# Patient Record
Sex: Female | Born: 1965 | Race: Black or African American | Hispanic: No | Marital: Married | Smoking: Never smoker
Health system: Southern US, Community
[De-identification: ages and names within clinical notes are randomized; demographics above are authoritative.]

## PROBLEM LIST (undated history)

## (undated) DIAGNOSIS — B019 Varicella without complication: Secondary | ICD-10-CM

## (undated) DIAGNOSIS — R569 Unspecified convulsions: Secondary | ICD-10-CM

## (undated) DIAGNOSIS — I10 Essential (primary) hypertension: Secondary | ICD-10-CM

## (undated) DIAGNOSIS — I639 Cerebral infarction, unspecified: Secondary | ICD-10-CM

## (undated) DIAGNOSIS — R51 Headache: Secondary | ICD-10-CM

## (undated) DIAGNOSIS — J309 Allergic rhinitis, unspecified: Secondary | ICD-10-CM

## (undated) DIAGNOSIS — E119 Type 2 diabetes mellitus without complications: Secondary | ICD-10-CM

## (undated) DIAGNOSIS — R32 Unspecified urinary incontinence: Secondary | ICD-10-CM

## (undated) DIAGNOSIS — E785 Hyperlipidemia, unspecified: Secondary | ICD-10-CM

## (undated) DIAGNOSIS — G40909 Epilepsy, unspecified, not intractable, without status epilepticus: Secondary | ICD-10-CM

## (undated) DIAGNOSIS — R519 Headache, unspecified: Secondary | ICD-10-CM

## (undated) DIAGNOSIS — F32A Depression, unspecified: Secondary | ICD-10-CM

## (undated) DIAGNOSIS — F329 Major depressive disorder, single episode, unspecified: Secondary | ICD-10-CM

## (undated) HISTORY — DX: Allergic rhinitis, unspecified: J30.9

## (undated) HISTORY — DX: Unspecified urinary incontinence: R32

## (undated) HISTORY — DX: Headache: R51

## (undated) HISTORY — DX: Varicella without complication: B01.9

## (undated) HISTORY — DX: Headache, unspecified: R51.9

---

## 2007-10-28 ENCOUNTER — Other Ambulatory Visit: Payer: Self-pay

## 2007-10-28 ENCOUNTER — Emergency Department: Payer: Self-pay | Admitting: Emergency Medicine

## 2008-06-13 ENCOUNTER — Emergency Department: Payer: Self-pay | Admitting: Emergency Medicine

## 2009-02-07 ENCOUNTER — Emergency Department: Payer: Self-pay | Admitting: Emergency Medicine

## 2009-07-07 ENCOUNTER — Emergency Department: Payer: Self-pay | Admitting: Emergency Medicine

## 2009-12-18 ENCOUNTER — Emergency Department: Payer: Self-pay | Admitting: Emergency Medicine

## 2010-05-29 ENCOUNTER — Emergency Department: Payer: Self-pay | Admitting: Emergency Medicine

## 2010-07-26 ENCOUNTER — Emergency Department: Payer: Self-pay | Admitting: Emergency Medicine

## 2010-12-17 ENCOUNTER — Emergency Department: Payer: Self-pay | Admitting: Emergency Medicine

## 2011-05-30 ENCOUNTER — Emergency Department: Payer: Self-pay | Admitting: *Deleted

## 2011-08-03 ENCOUNTER — Emergency Department: Payer: Self-pay | Admitting: Unknown Physician Specialty

## 2011-08-05 ENCOUNTER — Emergency Department: Payer: Self-pay | Admitting: Internal Medicine

## 2011-08-27 ENCOUNTER — Emergency Department: Payer: Self-pay | Admitting: Unknown Physician Specialty

## 2012-01-11 ENCOUNTER — Emergency Department: Payer: Self-pay | Admitting: Emergency Medicine

## 2012-01-15 ENCOUNTER — Emergency Department: Payer: Self-pay | Admitting: Emergency Medicine

## 2012-03-15 ENCOUNTER — Emergency Department: Payer: Self-pay | Admitting: Emergency Medicine

## 2012-06-16 ENCOUNTER — Inpatient Hospital Stay: Payer: Self-pay | Admitting: Internal Medicine

## 2012-06-16 LAB — COMPREHENSIVE METABOLIC PANEL
Anion Gap: 12 (ref 7–16)
BUN: 10 mg/dL (ref 7–18)
Bilirubin,Total: 0.5 mg/dL (ref 0.2–1.0)
Chloride: 103 mmol/L (ref 98–107)
Co2: 24 mmol/L (ref 21–32)
Creatinine: 0.63 mg/dL (ref 0.60–1.30)
EGFR (African American): >60
EGFR (Non-African Amer.): >60
Glucose: 326 mg/dL — ABNORMAL HIGH (ref 65–99)
Osmolality: 289 (ref 275–301)
Potassium: 5.3 mmol/L — ABNORMAL HIGH (ref 3.5–5.1)
SGOT(AST): 25 U/L (ref 15–37)
Total Protein: 7.5 g/dL (ref 6.4–8.2)

## 2012-06-16 LAB — URINALYSIS, COMPLETE
Bacteria: NONE SEEN
Blood: NEGATIVE
Glucose,UR: >=500 mg/dL (ref 0–75)
Leukocyte Esterase: NEGATIVE
Nitrite: NEGATIVE
Ph: 7 (ref 4.5–8.0)
Protein: NEGATIVE
Specific Gravity: 1.018 (ref 1.003–1.030)
WBC UR: 3 /HPF (ref 0–5)

## 2012-06-16 LAB — CBC WITH DIFFERENTIAL/PLATELET
Basophil #: 0.1 10*3/uL (ref 0.0–0.1)
Basophil %: 0.5 %
Eosinophil %: 0.3 %
HGB: 12.1 g/dL (ref 12.0–16.0)
Lymphocyte %: 10.1 %
Monocyte #: 0.8 x10 3/mm (ref 0.2–0.9)
Neutrophil %: 82.9 %
Platelet: 131 10*3/uL — ABNORMAL LOW (ref 150–440)

## 2012-06-16 LAB — PRO B NATRIURETIC PEPTIDE: B-Type Natriuretic Peptide: 35 pg/mL (ref 0–125)

## 2012-06-17 LAB — BASIC METABOLIC PANEL
Anion Gap: 11 (ref 7–16)
BUN: 10 mg/dL (ref 7–18)
Chloride: 104 mmol/L (ref 98–107)
Co2: 25 mmol/L (ref 21–32)
Creatinine: 0.79 mg/dL (ref 0.60–1.30)
EGFR (African American): >60
EGFR (Non-African Amer.): >60

## 2012-06-17 LAB — HEMOGLOBIN A1C: Hemoglobin A1C: 12 % — ABNORMAL HIGH (ref 4.2–6.3)

## 2012-06-17 LAB — T4, FREE: Free Thyroxine: 1.06 ng/dL (ref 0.76–1.46)

## 2012-06-17 LAB — TSH: Thyroid Stimulating Horm: 0.363 u[IU]/mL — ABNORMAL LOW

## 2012-06-17 LAB — LIPID PANEL
Cholesterol: 188 mg/dL (ref 0–200)
HDL Cholesterol: 52 mg/dL (ref 40–60)
Ldl Cholesterol, Calc: 121 mg/dL — ABNORMAL HIGH (ref 0–100)

## 2012-12-29 ENCOUNTER — Emergency Department: Payer: Self-pay | Admitting: Emergency Medicine

## 2012-12-29 LAB — CBC
HGB: 11.6 g/dL — ABNORMAL LOW (ref 12.0–16.0)
MCHC: 30.9 g/dL — ABNORMAL LOW (ref 32.0–36.0)
Platelet: 189 10*3/uL (ref 150–440)
RBC: 5.94 10*6/uL — ABNORMAL HIGH (ref 3.80–5.20)
WBC: 6.7 10*3/uL (ref 3.6–11.0)

## 2012-12-29 LAB — URINALYSIS, COMPLETE
Blood: NEGATIVE
Glucose,UR: >=500 mg/dL (ref 0–75)
Leukocyte Esterase: NEGATIVE
Nitrite: NEGATIVE
Ph: 5 (ref 4.5–8.0)
Protein: NEGATIVE
Specific Gravity: 1.044 (ref 1.003–1.030)
Squamous Epithelial: 17

## 2012-12-29 LAB — COMPREHENSIVE METABOLIC PANEL
Anion Gap: 4 — ABNORMAL LOW (ref 7–16)
BUN: 10 mg/dL (ref 7–18)
EGFR (African American): >60
EGFR (Non-African Amer.): >60
Glucose: 309 mg/dL — ABNORMAL HIGH (ref 65–99)
Osmolality: 283 (ref 275–301)
Potassium: 4.5 mmol/L (ref 3.5–5.1)
SGOT(AST): 13 U/L — ABNORMAL LOW (ref 15–37)
Total Protein: 7.2 g/dL (ref 6.4–8.2)

## 2013-02-11 LAB — COMPREHENSIVE METABOLIC PANEL
Albumin: 3.4 g/dL (ref 3.4–5.0)
Alkaline Phosphatase: 54 U/L (ref 50–136)
Anion Gap: 7 (ref 7–16)
Bilirubin,Total: 0.5 mg/dL (ref 0.2–1.0)
Creatinine: 0.68 mg/dL (ref 0.60–1.30)
EGFR (African American): >60
Glucose: 266 mg/dL — ABNORMAL HIGH (ref 65–99)
Osmolality: 281 (ref 275–301)
SGOT(AST): 41 U/L — ABNORMAL HIGH (ref 15–37)
Sodium: 136 mmol/L (ref 136–145)

## 2013-02-11 LAB — URINALYSIS, COMPLETE
Bilirubin,UR: NEGATIVE
Blood: NEGATIVE
Glucose,UR: >=500 mg/dL (ref 0–75)
Ph: 6 (ref 4.5–8.0)
Protein: 30
RBC,UR: 8 /HPF (ref 0–5)
Specific Gravity: 1.036 (ref 1.003–1.030)
Squamous Epithelial: 55
WBC UR: 8 /HPF (ref 0–5)

## 2013-02-11 LAB — CBC
HCT: 35.8 % (ref 35.0–47.0)
HGB: 11.4 g/dL — ABNORMAL LOW (ref 12.0–16.0)
MCH: 19.9 pg — ABNORMAL LOW (ref 26.0–34.0)
MCV: 63 fL — ABNORMAL LOW (ref 80–100)
Platelet: 187 10*3/uL (ref 150–440)
WBC: 9.3 10*3/uL (ref 3.6–11.0)

## 2013-02-11 LAB — HCG, QUANTITATIVE, PREGNANCY: Beta Hcg, Quant.: <1 m[IU]/mL — ABNORMAL LOW

## 2013-02-11 LAB — TROPONIN I: Troponin-I: <0.02 ng/mL

## 2013-02-12 ENCOUNTER — Inpatient Hospital Stay: Payer: Self-pay | Admitting: Internal Medicine

## 2013-02-12 ENCOUNTER — Ambulatory Visit: Payer: Self-pay | Admitting: Neurology

## 2013-02-12 LAB — LIPID PANEL
HDL Cholesterol: 50 mg/dL (ref 40–60)
Ldl Cholesterol, Calc: 143 mg/dL — ABNORMAL HIGH (ref 0–100)
VLDL Cholesterol, Calc: 24 mg/dL (ref 5–40)

## 2013-02-12 LAB — CK-MB: CK-MB: <0.5 ng/mL — ABNORMAL LOW (ref 0.5–3.6)

## 2013-02-12 LAB — HEMOGLOBIN A1C: Hemoglobin A1C: 13.3 % — ABNORMAL HIGH (ref 4.2–6.3)

## 2013-02-12 LAB — TROPONIN I: Troponin-I: <0.02 ng/mL

## 2013-02-12 LAB — DRUG SCREEN, URINE
Amphetamines, Ur Screen: NEGATIVE (ref ?–1000)
Benzodiazepine, Ur Scrn: NEGATIVE (ref ?–200)
Cannabinoid 50 Ng, Ur ~~LOC~~: NEGATIVE (ref ?–50)
Cocaine Metabolite,Ur ~~LOC~~: NEGATIVE (ref ?–300)
Methadone, Ur Screen: NEGATIVE (ref ?–300)
Phencyclidine (PCP) Ur S: NEGATIVE (ref ?–25)
Tricyclic, Ur Screen: NEGATIVE (ref ?–1000)

## 2013-02-13 LAB — TSH: Thyroid Stimulating Horm: 0.314 u[IU]/mL — ABNORMAL LOW

## 2013-02-20 ENCOUNTER — Emergency Department: Payer: Self-pay | Admitting: Emergency Medicine

## 2013-06-08 ENCOUNTER — Emergency Department: Payer: Self-pay | Admitting: Emergency Medicine

## 2013-06-08 LAB — COMPREHENSIVE METABOLIC PANEL
Albumin: 3.5 g/dL (ref 3.4–5.0)
Alkaline Phosphatase: 54 U/L (ref 50–136)
BUN: 10 mg/dL (ref 7–18)
Bilirubin,Total: 0.6 mg/dL (ref 0.2–1.0)
Calcium, Total: 8.9 mg/dL (ref 8.5–10.1)
Chloride: 107 mmol/L (ref 98–107)
Co2: 28 mmol/L (ref 21–32)
Creatinine: 0.78 mg/dL (ref 0.60–1.30)
EGFR (African American): >60
Osmolality: 282 (ref 275–301)
SGOT(AST): 12 U/L — ABNORMAL LOW (ref 15–37)
SGPT (ALT): 14 U/L (ref 12–78)
Sodium: 139 mmol/L (ref 136–145)
Total Protein: 7.1 g/dL (ref 6.4–8.2)

## 2013-06-08 LAB — CBC WITH DIFFERENTIAL/PLATELET
Basophil #: 0.1 10*3/uL (ref 0.0–0.1)
Basophil %: 0.9 %
Eosinophil #: 0.1 10*3/uL (ref 0.0–0.7)
HCT: 34.3 % — ABNORMAL LOW (ref 35.0–47.0)
HGB: 10.9 g/dL — ABNORMAL LOW (ref 12.0–16.0)
Lymphocyte #: 2.1 10*3/uL (ref 1.0–3.6)
Lymphocyte %: 27.4 %
MCH: 20.4 pg — ABNORMAL LOW (ref 26.0–34.0)
Monocyte #: 0.9 x10 3/mm (ref 0.2–0.9)
Platelet: 170 10*3/uL (ref 150–440)
RBC: 5.35 10*6/uL — ABNORMAL HIGH (ref 3.80–5.20)

## 2013-06-08 LAB — URINALYSIS, COMPLETE
Bilirubin,UR: NEGATIVE
Blood: NEGATIVE
Glucose,UR: >=500 mg/dL (ref 0–75)
Ketone: NEGATIVE
Leukocyte Esterase: NEGATIVE
Nitrite: NEGATIVE
Ph: 5 (ref 4.5–8.0)
Protein: 30
RBC,UR: 2 /HPF (ref 0–5)
Specific Gravity: 1.024 (ref 1.003–1.030)

## 2013-06-23 ENCOUNTER — Emergency Department: Payer: Self-pay | Admitting: Emergency Medicine

## 2013-07-10 ENCOUNTER — Emergency Department: Payer: Self-pay | Admitting: Emergency Medicine

## 2013-07-10 LAB — TROPONIN I: Troponin-I: <0.02 ng/mL

## 2013-07-10 LAB — CBC
HGB: 11.4 g/dL — ABNORMAL LOW (ref 12.0–16.0)
MCHC: 32.4 g/dL (ref 32.0–36.0)
MCV: 63 fL — ABNORMAL LOW (ref 80–100)
RDW: 17.7 % — ABNORMAL HIGH (ref 11.5–14.5)
WBC: 7.9 10*3/uL (ref 3.6–11.0)

## 2013-07-10 LAB — CK TOTAL AND CKMB (NOT AT ARMC)
CK, Total: 116 U/L (ref 21–215)
CK-MB: 0.8 ng/mL (ref 0.5–3.6)

## 2013-07-10 LAB — COMPREHENSIVE METABOLIC PANEL
Alkaline Phosphatase: 65 U/L (ref 50–136)
Anion Gap: 5 — ABNORMAL LOW (ref 7–16)
BUN: 14 mg/dL (ref 7–18)
Bilirubin,Total: 0.4 mg/dL (ref 0.2–1.0)
Calcium, Total: 9.3 mg/dL (ref 8.5–10.1)
Co2: 28 mmol/L (ref 21–32)
Creatinine: 0.82 mg/dL (ref 0.60–1.30)
EGFR (African American): >60
EGFR (Non-African Amer.): >60
Potassium: 3.4 mmol/L — ABNORMAL LOW (ref 3.5–5.1)
SGPT (ALT): 20 U/L (ref 12–78)
Sodium: 136 mmol/L (ref 136–145)
Total Protein: 8.4 g/dL — ABNORMAL HIGH (ref 6.4–8.2)

## 2013-08-04 LAB — COMPREHENSIVE METABOLIC PANEL
Albumin: 3.8 g/dL (ref 3.4–5.0)
Alkaline Phosphatase: 61 U/L (ref 50–136)
BUN: 14 mg/dL (ref 7–18)
Chloride: 100 mmol/L (ref 98–107)
Co2: 29 mmol/L (ref 21–32)
Creatinine: 0.95 mg/dL (ref 0.60–1.30)
EGFR (Non-African Amer.): >60
Glucose: 215 mg/dL — ABNORMAL HIGH (ref 65–99)
Osmolality: 283 (ref 275–301)
Potassium: 3.1 mmol/L — ABNORMAL LOW (ref 3.5–5.1)
SGOT(AST): 16 U/L (ref 15–37)
SGPT (ALT): 24 U/L (ref 12–78)
Sodium: 138 mmol/L (ref 136–145)
Total Protein: 7.5 g/dL (ref 6.4–8.2)

## 2013-08-04 LAB — CBC
HGB: 11.1 g/dL — ABNORMAL LOW (ref 12.0–16.0)
MCV: 64 fL — ABNORMAL LOW (ref 80–100)
Platelet: 172 10*3/uL (ref 150–440)
RBC: 5.46 10*6/uL — ABNORMAL HIGH (ref 3.80–5.20)
WBC: 8.5 10*3/uL (ref 3.6–11.0)

## 2013-08-04 LAB — DRUG SCREEN, URINE
Amphetamines, Ur Screen: NEGATIVE (ref ?–1000)
Benzodiazepine, Ur Scrn: NEGATIVE (ref ?–200)
Cocaine Metabolite,Ur ~~LOC~~: NEGATIVE (ref ?–300)
MDMA (Ecstasy)Ur Screen: NEGATIVE (ref ?–500)
Methadone, Ur Screen: NEGATIVE (ref ?–300)
Opiate, Ur Screen: NEGATIVE (ref ?–300)
Phencyclidine (PCP) Ur S: NEGATIVE (ref ?–25)

## 2013-08-04 LAB — ETHANOL
Ethanol %: <0.003 % (ref 0.000–0.080)
Ethanol: <3 mg/dL

## 2013-08-04 LAB — URINALYSIS, COMPLETE
Blood: NEGATIVE
Hyaline Cast: 2
Nitrite: NEGATIVE
Protein: >=500
RBC,UR: 1 /HPF (ref 0–5)
Specific Gravity: 1.021 (ref 1.003–1.030)
Squamous Epithelial: 11

## 2013-08-04 LAB — ACETAMINOPHEN LEVEL: Acetaminophen: 45 ug/mL — ABNORMAL HIGH

## 2013-08-04 LAB — PROTIME-INR: INR: 1

## 2013-08-04 LAB — SALICYLATE LEVEL: Salicylates, Serum: <1.7 mg/dL

## 2013-08-05 ENCOUNTER — Inpatient Hospital Stay: Payer: Self-pay | Admitting: Psychiatry

## 2013-08-05 LAB — ACETAMINOPHEN LEVEL: Acetaminophen: <2 ug/mL

## 2013-08-05 LAB — TSH: Thyroid Stimulating Horm: 1.27 u[IU]/mL

## 2013-12-08 ENCOUNTER — Emergency Department: Payer: Self-pay | Admitting: Emergency Medicine

## 2013-12-27 ENCOUNTER — Emergency Department: Payer: Self-pay | Admitting: Emergency Medicine

## 2013-12-27 LAB — COMPREHENSIVE METABOLIC PANEL
Albumin: 3.4 g/dL (ref 3.4–5.0)
Alkaline Phosphatase: 53 U/L
Anion Gap: 6 — ABNORMAL LOW (ref 7–16)
BUN: 13 mg/dL (ref 7–18)
Bilirubin,Total: 0.4 mg/dL (ref 0.2–1.0)
Calcium, Total: 8.2 mg/dL — ABNORMAL LOW (ref 8.5–10.1)
Chloride: 107 mmol/L (ref 98–107)
Co2: 25 mmol/L (ref 21–32)
Creatinine: 0.79 mg/dL (ref 0.60–1.30)
EGFR (African American): >60
EGFR (Non-African Amer.): >60
GLUCOSE: 256 mg/dL — AB (ref 65–99)
Osmolality: 285 (ref 275–301)
POTASSIUM: 3.7 mmol/L (ref 3.5–5.1)
SGOT(AST): 19 U/L (ref 15–37)
SGPT (ALT): 20 U/L (ref 12–78)
SODIUM: 138 mmol/L (ref 136–145)
TOTAL PROTEIN: 7 g/dL (ref 6.4–8.2)

## 2013-12-27 LAB — TROPONIN I: Troponin-I: <0.02 ng/mL

## 2013-12-27 LAB — CBC
HCT: 32.9 % — ABNORMAL LOW (ref 35.0–47.0)
HGB: 10.3 g/dL — AB (ref 12.0–16.0)
MCH: 20.4 pg — ABNORMAL LOW (ref 26.0–34.0)
MCHC: 31.4 g/dL — ABNORMAL LOW (ref 32.0–36.0)
MCV: 65 fL — AB (ref 80–100)
PLATELETS: 161 10*3/uL (ref 150–440)
RBC: 5.05 10*6/uL (ref 3.80–5.20)
RDW: 17.2 % — ABNORMAL HIGH (ref 11.5–14.5)
WBC: 7 10*3/uL (ref 3.6–11.0)

## 2014-01-13 ENCOUNTER — Emergency Department: Payer: Self-pay | Admitting: Emergency Medicine

## 2014-01-13 LAB — URINALYSIS, COMPLETE
BILIRUBIN, UR: NEGATIVE
NITRITE: NEGATIVE
Ph: 6 (ref 4.5–8.0)
Protein: 100
RBC,UR: 215 /HPF (ref 0–5)
Specific Gravity: 1.019 (ref 1.003–1.030)
WBC UR: 396 /HPF (ref 0–5)

## 2014-01-13 LAB — CBC WITH DIFFERENTIAL/PLATELET
BASOS PCT: 0.8 %
Basophil #: 0.1 10*3/uL (ref 0.0–0.1)
EOS PCT: 1.1 %
Eosinophil #: 0.1 10*3/uL (ref 0.0–0.7)
HCT: 35.2 % (ref 35.0–47.0)
HGB: 10.8 g/dL — ABNORMAL LOW (ref 12.0–16.0)
Lymphocyte #: 1.6 10*3/uL (ref 1.0–3.6)
Lymphocyte %: 16 %
MCH: 19.8 pg — AB (ref 26.0–34.0)
MCHC: 30.6 g/dL — AB (ref 32.0–36.0)
MCV: 65 fL — ABNORMAL LOW (ref 80–100)
MONOS PCT: 9 %
Monocyte #: 0.9 x10 3/mm (ref 0.2–0.9)
Neutrophil #: 7.5 10*3/uL — ABNORMAL HIGH (ref 1.4–6.5)
Neutrophil %: 73.1 %
Platelet: 257 10*3/uL (ref 150–440)
RBC: 5.43 10*6/uL — ABNORMAL HIGH (ref 3.80–5.20)
RDW: 17.7 % — AB (ref 11.5–14.5)
WBC: 10.3 10*3/uL (ref 3.6–11.0)

## 2014-01-13 LAB — COMPREHENSIVE METABOLIC PANEL
ALT: 23 U/L (ref 12–78)
ANION GAP: 5 — AB (ref 7–16)
Albumin: 3.2 g/dL — ABNORMAL LOW (ref 3.4–5.0)
Alkaline Phosphatase: 56 U/L
BUN: 14 mg/dL (ref 7–18)
Bilirubin,Total: 1 mg/dL (ref 0.2–1.0)
CALCIUM: 9.1 mg/dL (ref 8.5–10.1)
CREATININE: 0.52 mg/dL — AB (ref 0.60–1.30)
Chloride: 100 mmol/L (ref 98–107)
Co2: 28 mmol/L (ref 21–32)
EGFR (African American): >60
Glucose: 253 mg/dL — ABNORMAL HIGH (ref 65–99)
Osmolality: 275 (ref 275–301)
Potassium: 5.6 mmol/L — ABNORMAL HIGH (ref 3.5–5.1)
SGOT(AST): 61 U/L — ABNORMAL HIGH (ref 15–37)
Sodium: 133 mmol/L — ABNORMAL LOW (ref 136–145)
Total Protein: 7.8 g/dL (ref 6.4–8.2)

## 2014-01-15 LAB — URINE CULTURE

## 2014-02-15 ENCOUNTER — Emergency Department: Payer: Self-pay | Admitting: Emergency Medicine

## 2014-03-03 ENCOUNTER — Emergency Department: Payer: Self-pay | Admitting: Emergency Medicine

## 2014-03-03 LAB — COMPREHENSIVE METABOLIC PANEL
ALBUMIN: 3.5 g/dL (ref 3.4–5.0)
ALK PHOS: 54 U/L
ANION GAP: 7 (ref 7–16)
BILIRUBIN TOTAL: 0.7 mg/dL (ref 0.2–1.0)
BUN: 11 mg/dL (ref 7–18)
Calcium, Total: 8.4 mg/dL — ABNORMAL LOW (ref 8.5–10.1)
Chloride: 104 mmol/L (ref 98–107)
Co2: 25 mmol/L (ref 21–32)
Creatinine: 0.78 mg/dL (ref 0.60–1.30)
EGFR (African American): >60
EGFR (Non-African Amer.): >60
Glucose: 226 mg/dL — ABNORMAL HIGH (ref 65–99)
OSMOLALITY: 278 (ref 275–301)
Potassium: 3.5 mmol/L (ref 3.5–5.1)
SGOT(AST): 13 U/L — ABNORMAL LOW (ref 15–37)
SGPT (ALT): 13 U/L (ref 12–78)
SODIUM: 136 mmol/L (ref 136–145)
TOTAL PROTEIN: 7.1 g/dL (ref 6.4–8.2)

## 2014-03-03 LAB — CBC
HCT: 35.3 % (ref 35.0–47.0)
HGB: 10.9 g/dL — AB (ref 12.0–16.0)
MCH: 20.2 pg — AB (ref 26.0–34.0)
MCHC: 31 g/dL — ABNORMAL LOW (ref 32.0–36.0)
MCV: 65 fL — ABNORMAL LOW (ref 80–100)
PLATELETS: 179 10*3/uL (ref 150–440)
RBC: 5.42 10*6/uL — ABNORMAL HIGH (ref 3.80–5.20)
RDW: 18.3 % — AB (ref 11.5–14.5)
WBC: 7.8 10*3/uL (ref 3.6–11.0)

## 2014-03-03 LAB — ETHANOL
Ethanol %: <0.003 % (ref 0.000–0.080)
Ethanol: <3 mg/dL

## 2014-03-03 LAB — SALICYLATE LEVEL: Salicylates, Serum: <1.7 mg/dL

## 2014-03-03 LAB — ACETAMINOPHEN LEVEL: Acetaminophen: <2 ug/mL

## 2014-03-03 LAB — TSH: THYROID STIMULATING HORM: 1.78 u[IU]/mL

## 2014-03-04 LAB — URINALYSIS, COMPLETE
Bacteria: NONE SEEN
Bilirubin,UR: NEGATIVE
Blood: NEGATIVE
Glucose,UR: >=500 mg/dL (ref 0–75)
Ketone: NEGATIVE
LEUKOCYTE ESTERASE: NEGATIVE
NITRITE: NEGATIVE
Ph: 5 (ref 4.5–8.0)
Protein: NEGATIVE
RBC,UR: <1 /HPF (ref 0–5)
SPECIFIC GRAVITY: 1.022 (ref 1.003–1.030)
Squamous Epithelial: 1
WBC UR: 1 /HPF (ref 0–5)

## 2014-03-04 LAB — DRUG SCREEN, URINE
Amphetamines, Ur Screen: NEGATIVE (ref ?–1000)
Barbiturates, Ur Screen: NEGATIVE (ref ?–200)
Benzodiazepine, Ur Scrn: NEGATIVE (ref ?–200)
Cannabinoid 50 Ng, Ur ~~LOC~~: NEGATIVE (ref ?–50)
Cocaine Metabolite,Ur ~~LOC~~: NEGATIVE (ref ?–300)
MDMA (ECSTASY) UR SCREEN: NEGATIVE (ref ?–500)
Methadone, Ur Screen: NEGATIVE (ref ?–300)
Opiate, Ur Screen: NEGATIVE (ref ?–300)
PHENCYCLIDINE (PCP) UR S: NEGATIVE (ref ?–25)
Tricyclic, Ur Screen: NEGATIVE (ref ?–1000)

## 2014-03-08 ENCOUNTER — Emergency Department: Payer: Self-pay | Admitting: Emergency Medicine

## 2014-03-08 LAB — BASIC METABOLIC PANEL
Anion Gap: 11 (ref 7–16)
BUN: 9 mg/dL (ref 7–18)
CO2: 24 mmol/L (ref 21–32)
Calcium, Total: 8.9 mg/dL (ref 8.5–10.1)
Chloride: 103 mmol/L (ref 98–107)
Creatinine: 0.78 mg/dL (ref 0.60–1.30)
EGFR (African American): >60
EGFR (Non-African Amer.): >60
GLUCOSE: 353 mg/dL — AB (ref 65–99)
Osmolality: 289 (ref 275–301)
Potassium: 3.6 mmol/L (ref 3.5–5.1)
SODIUM: 138 mmol/L (ref 136–145)

## 2014-03-08 LAB — CBC
HCT: 35.9 % (ref 35.0–47.0)
HGB: 11 g/dL — AB (ref 12.0–16.0)
MCH: 19.9 pg — ABNORMAL LOW (ref 26.0–34.0)
MCHC: 30.6 g/dL — ABNORMAL LOW (ref 32.0–36.0)
MCV: 65 fL — AB (ref 80–100)
Platelet: 152 10*3/uL (ref 150–440)
RBC: 5.53 10*6/uL — AB (ref 3.80–5.20)
RDW: 17.9 % — ABNORMAL HIGH (ref 11.5–14.5)
WBC: 6.1 10*3/uL (ref 3.6–11.0)

## 2014-03-08 LAB — TROPONIN I: Troponin-I: <0.02 ng/mL

## 2014-03-17 ENCOUNTER — Inpatient Hospital Stay (HOSPITAL_COMMUNITY): Payer: BC Managed Care – PPO

## 2014-03-17 ENCOUNTER — Emergency Department: Payer: Self-pay | Admitting: Emergency Medicine

## 2014-03-17 ENCOUNTER — Encounter (HOSPITAL_COMMUNITY): Payer: Self-pay | Admitting: Radiology

## 2014-03-17 ENCOUNTER — Inpatient Hospital Stay (HOSPITAL_COMMUNITY)
Admission: EM | Admit: 2014-03-17 | Discharge: 2014-03-19 | DRG: 069 | Disposition: A | Discharge status: Home / Self Care | Payer: BC Managed Care – PPO | Source: Other Acute Inpatient Hospital | Attending: Neurology | Admitting: Neurology

## 2014-03-17 DIAGNOSIS — I10 Essential (primary) hypertension: Secondary | ICD-10-CM | POA: Diagnosis present

## 2014-03-17 DIAGNOSIS — R51 Headache: Secondary | ICD-10-CM | POA: Diagnosis present

## 2014-03-17 DIAGNOSIS — Z6841 Body Mass Index (BMI) 40.0 and over, adult: Secondary | ICD-10-CM | POA: Diagnosis not present

## 2014-03-17 DIAGNOSIS — IMO0001 Reserved for inherently not codable concepts without codable children: Secondary | ICD-10-CM | POA: Diagnosis present

## 2014-03-17 DIAGNOSIS — E1165 Type 2 diabetes mellitus with hyperglycemia: Secondary | ICD-10-CM

## 2014-03-17 DIAGNOSIS — G459 Transient cerebral ischemic attack, unspecified: Secondary | ICD-10-CM | POA: Diagnosis present

## 2014-03-17 DIAGNOSIS — E785 Hyperlipidemia, unspecified: Secondary | ICD-10-CM | POA: Diagnosis present

## 2014-03-17 DIAGNOSIS — I6350 Cerebral infarction due to unspecified occlusion or stenosis of unspecified cerebral artery: Secondary | ICD-10-CM

## 2014-03-17 DIAGNOSIS — F329 Major depressive disorder, single episode, unspecified: Secondary | ICD-10-CM | POA: Diagnosis present

## 2014-03-17 DIAGNOSIS — G43109 Migraine with aura, not intractable, without status migrainosus: Secondary | ICD-10-CM | POA: Diagnosis present

## 2014-03-17 DIAGNOSIS — E119 Type 2 diabetes mellitus without complications: Secondary | ICD-10-CM | POA: Diagnosis present

## 2014-03-17 DIAGNOSIS — G8190 Hemiplegia, unspecified affecting unspecified side: Secondary | ICD-10-CM | POA: Diagnosis present

## 2014-03-17 DIAGNOSIS — I369 Nonrheumatic tricuspid valve disorder, unspecified: Secondary | ICD-10-CM

## 2014-03-17 DIAGNOSIS — Z79899 Other long term (current) drug therapy: Secondary | ICD-10-CM | POA: Diagnosis not present

## 2014-03-17 DIAGNOSIS — F3289 Other specified depressive episodes: Secondary | ICD-10-CM | POA: Diagnosis present

## 2014-03-17 DIAGNOSIS — R519 Headache, unspecified: Secondary | ICD-10-CM | POA: Diagnosis present

## 2014-03-17 HISTORY — DX: Major depressive disorder, single episode, unspecified: F32.9

## 2014-03-17 HISTORY — DX: Depression, unspecified: F32.A

## 2014-03-17 HISTORY — DX: Hyperlipidemia, unspecified: E78.5

## 2014-03-17 HISTORY — DX: Essential (primary) hypertension: I10

## 2014-03-17 HISTORY — DX: Type 2 diabetes mellitus without complications: E11.9

## 2014-03-17 LAB — CBC WITH DIFFERENTIAL/PLATELET
BASOS PCT: 1.2 %
Basophil #: 0.1 10*3/uL (ref 0.0–0.1)
EOS PCT: 2 %
Eosinophil #: 0.1 10*3/uL (ref 0.0–0.7)
HCT: 35.7 % (ref 35.0–47.0)
HGB: 11.4 g/dL — ABNORMAL LOW (ref 12.0–16.0)
LYMPHS PCT: 27.8 %
Lymphocyte #: 1.7 10*3/uL (ref 1.0–3.6)
MCH: 20.5 pg — AB (ref 26.0–34.0)
MCHC: 31.8 g/dL — ABNORMAL LOW (ref 32.0–36.0)
MCV: 64 fL — AB (ref 80–100)
Monocyte #: 0.7 x10 3/mm (ref 0.2–0.9)
Monocyte %: 12.2 %
Neutrophil #: 3.5 10*3/uL (ref 1.4–6.5)
Neutrophil %: 56.8 %
Platelet: 165 10*3/uL (ref 150–440)
RBC: 5.54 10*6/uL — ABNORMAL HIGH (ref 3.80–5.20)
RDW: 17.8 % — AB (ref 11.5–14.5)
WBC: 6.1 10*3/uL (ref 3.6–11.0)

## 2014-03-17 LAB — MRSA PCR SCREENING: MRSA by PCR: NEGATIVE

## 2014-03-17 LAB — COMPREHENSIVE METABOLIC PANEL
ALBUMIN: 3.4 g/dL (ref 3.4–5.0)
AST: 16 U/L (ref 15–37)
Alkaline Phosphatase: 58 U/L
Anion Gap: 10 (ref 7–16)
BILIRUBIN TOTAL: 0.5 mg/dL (ref 0.2–1.0)
BUN: 6 mg/dL — AB (ref 7–18)
CALCIUM: 8.9 mg/dL (ref 8.5–10.1)
CREATININE: 0.76 mg/dL (ref 0.60–1.30)
Chloride: 101 mmol/L (ref 98–107)
Co2: 26 mmol/L (ref 21–32)
EGFR (Non-African Amer.): >60
GLUCOSE: 235 mg/dL — AB (ref 65–99)
OSMOLALITY: 279 (ref 275–301)
POTASSIUM: 3.7 mmol/L (ref 3.5–5.1)
SGPT (ALT): 19 U/L (ref 12–78)
Sodium: 137 mmol/L (ref 136–145)
Total Protein: 7.2 g/dL (ref 6.4–8.2)

## 2014-03-17 LAB — GLUCOSE, CAPILLARY
Glucose-Capillary: 282 mg/dL — ABNORMAL HIGH (ref 70–99)
Glucose-Capillary: 256 mg/dL — ABNORMAL HIGH (ref 70–99)

## 2014-03-17 LAB — APTT: ACTIVATED PTT: 28.1 s (ref 23.6–35.9)

## 2014-03-17 LAB — PROTIME-INR
INR: 0.9
Prothrombin Time: 12 secs (ref 11.5–14.7)

## 2014-03-17 LAB — TROPONIN I

## 2014-03-17 MED ORDER — STROKE: EARLY STAGES OF RECOVERY BOOK
Freq: Once | Status: AC
  Administered 2014-03-17: 15:00:00
  Filled 2014-03-17: qty 1

## 2014-03-17 MED ORDER — LABETALOL HCL 5 MG/ML IV SOLN: 10.0000 mg | INTRAVENOUS | Status: DC | PRN

## 2014-03-17 MED ORDER — HYDROMORPHONE HCL PF 1 MG/ML IJ SOLN
1.0000 mg | Freq: Once | INTRAMUSCULAR | Status: AC
  Administered 2014-03-17: 1 mg via INTRAVENOUS
  Filled 2014-03-17: qty 1

## 2014-03-17 MED ORDER — ACETAMINOPHEN 325 MG PO TABS
650.0000 mg | ORAL_TABLET | ORAL | Status: DC | PRN
  Administered 2014-03-19: 650 mg via ORAL
  Filled 2014-03-17: qty 2

## 2014-03-17 MED ORDER — SENNOSIDES-DOCUSATE SODIUM 8.6-50 MG PO TABS
1.0000 | ORAL_TABLET | Freq: Every evening | ORAL | Status: DC | PRN
  Filled 2014-03-17: qty 1

## 2014-03-17 MED ORDER — SODIUM CHLORIDE 0.9 % IV SOLN
INTRAVENOUS | Status: DC
  Administered 2014-03-17: 15:00:00 via INTRAVENOUS

## 2014-03-17 MED ORDER — PANTOPRAZOLE SODIUM 40 MG IV SOLR
40.0000 mg | Freq: Every day | INTRAVENOUS | Status: DC
  Administered 2014-03-17: 40 mg via INTRAVENOUS
  Filled 2014-03-17 (×2): qty 40

## 2014-03-17 MED ORDER — HYDROMORPHONE HCL PF 1 MG/ML IJ SOLN
INTRAMUSCULAR | Status: AC
  Filled 2014-03-17: qty 1

## 2014-03-17 MED ORDER — INSULIN ASPART 100 UNIT/ML ~~LOC~~ SOLN
0.0000 [IU] | Freq: Three times a day (TID) | SUBCUTANEOUS | Status: DC
  Administered 2014-03-17: 8 [IU] via SUBCUTANEOUS
  Administered 2014-03-18: 5 [IU] via SUBCUTANEOUS
  Administered 2014-03-18: 8 [IU] via SUBCUTANEOUS
  Administered 2014-03-18: 3 [IU] via SUBCUTANEOUS
  Administered 2014-03-19 (×2): 8 [IU] via SUBCUTANEOUS
  Administered 2014-03-19: 5 [IU] via SUBCUTANEOUS

## 2014-03-17 MED ORDER — LABETALOL HCL 5 MG/ML IV SOLN
INTRAVENOUS | Status: AC
  Administered 2014-03-17: 10 mg
  Filled 2014-03-17: qty 4

## 2014-03-17 MED ORDER — ACETAMINOPHEN 650 MG RE SUPP: 650.0000 mg | RECTAL | Status: DC | PRN

## 2014-03-17 MED ORDER — HYDROMORPHONE HCL PF 1 MG/ML IJ SOLN
1.0000 mg | Freq: Once | INTRAMUSCULAR | Status: AC
  Administered 2014-03-17: 1 mg via INTRAVENOUS

## 2014-03-17 NOTE — Progress Notes (Signed)
Echocardiogram 2D Echocardiogram has been performed.  [name] 03/17/2014, 4:01 PM

## 2014-03-17 NOTE — H&P (Signed)
H&P    Chief Complaint: Left sided weakness and numbness.   HPI:                                                                                                                                         [name] is an 48 y.o. female who states she woke up at 7 AM this morning and was feeling fine.  She was with her husband in the car and at 1040 went to get out of the car when she noted her left leg was weak.  She then noted her left arm felt as though it had pins and needles in it. She was brought to ED where initial CT head was negative.  PEr notes from ED her BP was significantly elevated 220/160. Labetalol was administer to get BP down and the decision to administer tPA was made.  tPA was initiated and patient was transferred to [facility] neuro ICU.  Currently patient feels her left side remains to have decreased sensation but strength has improved.   Date last known well: Date: 03/17/2014 Time last known well: Time: 10:40 tPA Given: Yes  Past Medical History  Diagnosis Date  . HTN (hypertension)   . Diabetes   . Hyperlipidemia   . Depression     No past surgical history on file.  Family History  Problem Relation Age of Onset  . Hypertension Mother   . Hypertension Father    Social History:  has no tobacco, alcohol, and drug history on file.  Allergies: No Known Allergies  Medications:                                                                                                                           Prior to Admission:  Prescriptions prior to admission  Medication Sig Dispense Refill  . glipiZIDE (GLUCOTROL XL) 5 MG 24 hr tablet Take 5 mg by mouth daily.      . Iron TABS Take 1 tablet by mouth daily. Strength unknown. otc.      . lisinopril-hydrochlorothiazide (PRINZIDE,ZESTORETIC) 20-12.5 MG per tablet Take 1 tablet by mouth daily.      . metFORMIN (GLUCOPHAGE) 1000 MG tablet Take 1,000 mg by mouth 2 (two) times daily with a meal.      . PARoxetine (PAXIL) 20  MG tablet Take 20 mg by mouth daily.      Marland Kitchen  rosuvastatin (CRESTOR) 10 MG tablet Take 10 mg by mouth daily.       Scheduled: .  stroke: mapping our early stages of recovery book   Does not apply Once  .  HYDROmorphone (DILAUDID) injection  1 mg Intravenous Once  . insulin aspart  0-15 Units Subcutaneous TID WC  . labetalol      . pantoprazole (PROTONIX) IV  40 mg Intravenous QHS    ROS:                                                                                                                                       History obtained from the patient  General ROS: negative for - chills, fatigue, fever, night sweats, weight gain or weight loss Psychological ROS: negative for - behavioral disorder, hallucinations, memory difficulties, mood swings or suicidal ideation Ophthalmic ROS: negative for - blurry vision, double vision, eye pain or loss of vision ENT ROS: negative for - epistaxis, nasal discharge, oral lesions, sore throat, tinnitus or vertigo Allergy and Immunology ROS: negative for - hives or itchy/watery eyes Hematological and Lymphatic ROS: negative for - bleeding problems, bruising or swollen lymph nodes Endocrine ROS: negative for - galactorrhea, hair pattern changes, polydipsia/polyuria or temperature intolerance Respiratory ROS: negative for - cough, hemoptysis, shortness of breath or wheezing Cardiovascular ROS: negative for - chest pain, dyspnea on exertion, edema or irregular heartbeat Gastrointestinal ROS: negative for - abdominal pain, diarrhea, hematemesis, nausea/vomiting or stool incontinence Genito-Urinary ROS: negative for - dysuria, hematuria, incontinence or urinary frequency/urgency Musculoskeletal ROS: negative for - joint swelling or muscular weakness Neurological ROS: as noted in HPI Dermatological ROS: negative for rash and skin lesion changes  General Exam: CV: RRR S1,S2 Abd: Soft NT/ND Lungs: CTAB Skin: WDI   Neurologic Examination:                                                                                                       Blood pressure 158/76, pulse 88, temperature 98.6 F (37 C), resp. rate 21, last menstrual period 03/16/2014, SpO2 96.00%.   Mental Status: Alert, oriented, thought content appropriate.  Speech fluent without evidence of aphasia.  Able to follow 3 step commands without difficulty. Cranial Nerves: II: Discs flat bilaterally; Visual fields unable to count fingers in left visual field, pupils equal, round, reactive to light and accommodation III,IV, VI: ptosis not present, extra-ocular motions intact bilaterally V,VII: smile symmetric, facial light touch sensation decreased on the left lower face VIII: hearing  normal bilaterally IX,X: gag reflex present XI: bilateral shoulder shrug XII: midline tongue extension without atrophy or fasciculations  Motor: Right : Upper extremity   5/5    Left:     Upper extremity   4/5  Lower extremity   5/5     Lower extremity   4/5 Tone and bulk:normal tone throughout; no atrophy noted Sensory: Pinprick and light touch decreased on the left arm and leg Deep Tendon Reflexes:  Right: Upper Extremity   Left: Upper extremity   biceps (C-5 to C-6) 2/4   biceps (C-5 to C-6) 2/4 tricep (C7) 2/4    triceps (C7) 2/4 Brachioradialis (C6) 2/4  Brachioradialis (C6) 2/4  Lower Extremity Lower Extremity  quadriceps (L-2 to L-4) 2/4   quadriceps (L-2 to L-4) 2/4 Achilles (S1) 1/4   Achilles (S1) 1/4  Plantars: Right: downgoing   Left: downgoing Cerebellar: normal finger-to-nose,  normal heel-to-shin test Gait: not tested CV: pulses palpable throughout    Lab Results: Basic Metabolic Panel: No results found for this basename: NA, K, CL, CO2, GLUCOSE, BUN, CREATININE, CALCIUM, MG, PHOS,  in the last 168 hours  Liver Function Tests: No results found for this basename: AST, ALT, ALKPHOS, BILITOT, PROT, ALBUMIN,  in the last 168 hours No results found for this basename: LIPASE,  AMYLASE,  in the last 168 hours No results found for this basename: AMMONIA,  in the last 168 hours  CBC: No results found for this basename: WBC, NEUTROABS, HGB, HCT, MCV, PLT,  in the last 168 hours  Cardiac Enzymes: No results found for this basename: CKTOTAL, CKMB, CKMBINDEX, TROPONINI,  in the last 168 hours  Lipid Panel: No results found for this basename: CHOL, TRIG, HDL, CHOLHDL, VLDL, LDLCALC,  in the last 168 hours  CBG: No results found for this basename: GLUCAP,  in the last 168 hours  Microbiology: Results for orders placed during the hospital encounter of 03/17/14  MRSA PCR SCREENING     Status: None   Collection Time    03/17/14  2:05 PM      Result Value Ref Range Status   MRSA by PCR NEGATIVE  NEGATIVE Final   Comment:            The GeneXpert MRSA Assay (FDA     approved for NASAL specimens     only), is one component of a     comprehensive MRSA colonization     surveillance program. It is not     intended to diagnose MRSA     infection nor to guide or     monitor treatment for     MRSA infections.    Coagulation Studies: No results found for this basename: LABPROT, INR,  in the last 72 hours  Imaging: Ct Head Wo Contrast  03/17/2014   CLINICAL DATA:  Stroke, post tPA, worsening headache  EXAM: CT HEAD WITHOUT CONTRAST  TECHNIQUE: Contiguous axial images were obtained from the base of the skull through the vertex without intravenous contrast.  COMPARISON:  03/17/2014  FINDINGS: Minimal atrophy.  Stable ventricular morphology with slight dilatation of the atrium and occipital horn of the RIGHT lateral ventricle.  No midline shift or mass effect.  Old RIGHT MCA territory infarct involving the RIGHT temporal and parietal lobes.  Small vessel chronic ischemic changes of deep cerebral white matter.  No intracranial hemorrhage, mass lesion or evidence acute infarction.  No extra-axial fluid collections.  Bones and sinuses unremarkable.  IMPRESSION: Atrophy with small  vessel chronic ischemic changes of deep cerebral white matter.  Old RIGHT MCA territory infarct.  No new intracranial abnormalities.   Electronically Signed   By: Ulyses SouthwardMark  Boles M.D.   On: 03/17/2014 14:37   Dg Chest Port 1 View  03/17/2014   CLINICAL DATA:  Stroke  EXAM: PORTABLE CHEST - 1 VIEW  COMPARISON:  Portal chest radiograph 03/17/2014.  FINDINGS: Low lung volumes. The heart size and mediastinal contours are within normal limits. Both lungs are clear. The visualized skeletal structures are unremarkable.  IMPRESSION: No active disease.   Electronically Signed   By: Salome HolmesHector  Cooper M.D.   On: 03/17/2014 14:50    Felicie MornDavid Smith PA-C [facility] [phone]  03/17/2014, 4:09 PM   Patient seen and examined.  Clinical course and management discussed.  Necessary edits performed.  I agree with the above.  Assessment and plan of care developed and discussed below.  Assessment: 48 y.o. female with acute onset of left leg weakness and left sided decreased sensation in the setting of elevated BP of 220/120. Patient was administered tPA while in [place] ED and [facility]. BP elevated.  Patient with multiple stroke risk factors and has had a stroke in the past.  On no antiplatelet therapy at home.  Head CT reviewed and shows no acute changes.     Stroke Risk Factors - diabetes mellitus, hyperlipidemia and hypertension  Plan: 1. HgbA1c, fasting lipid panel 2. MRI, MRA  of the brain without contrast 3. PT consult, OT consult, Speech consult 4. Echocardiogram 5. Carotid dopplers 6. Prophylactic therapy-None 7. BP management with Labetalol prn  8. Telemetry monitoring 9. Frequent neuro checks 10. Repeat head CT in 24 hours 11. Admit to NICU   Addendum:  Patient with a complaint of 12/10 headache after arrival to ICU.  tPA discontinued and patient sent for a STAT head CT.  CT shows no evidence of hemorrhage.  Infusion restarted and patient given a one  time dose of Dilaudid.    This patient is critically ill and at significant risk of neurological worsening, death and care requires constant monitoring of vital signs, hemodynamics,respiratory and cardiac monitoring, neurological assessment, discussion with family, other specialists and medical decision making of high complexity. I spent 80 minutes of neurocritical care time  in the care of  this patient.    Thana FarrLeslie Tahesha Skeet, MD [facility] [phone]  03/17/2014  4:30 PM

## 2014-03-18 ENCOUNTER — Inpatient Hospital Stay (HOSPITAL_COMMUNITY): Payer: BC Managed Care – PPO

## 2014-03-18 DIAGNOSIS — I6350 Cerebral infarction due to unspecified occlusion or stenosis of unspecified cerebral artery: Secondary | ICD-10-CM

## 2014-03-18 LAB — LIPID PANEL
Cholesterol: 182 mg/dL (ref 0–200)
HDL: 55 mg/dL (ref >39)
LDL CALC: 89 mg/dL (ref 0–99)
TRIGLYCERIDES: 190 mg/dL — AB (ref <150)
Total CHOL/HDL Ratio: 3.3 RATIO
VLDL: 38 mg/dL (ref 0–40)

## 2014-03-18 LAB — HEMOGLOBIN A1C
Hgb A1c MFr Bld: 11.2 % — ABNORMAL HIGH (ref <5.7)
Mean Plasma Glucose: 275 mg/dL — ABNORMAL HIGH (ref <117)

## 2014-03-18 LAB — GLUCOSE, CAPILLARY
GLUCOSE-CAPILLARY: 284 mg/dL — AB (ref 70–99)
Glucose-Capillary: 208 mg/dL — ABNORMAL HIGH (ref 70–99)
Glucose-Capillary: 176 mg/dL — ABNORMAL HIGH (ref 70–99)
Glucose-Capillary: 323 mg/dL — ABNORMAL HIGH (ref 70–99)

## 2014-03-18 MED ORDER — ASPIRIN EC 325 MG PO TBEC
325.0000 mg | DELAYED_RELEASE_TABLET | Freq: Every day | ORAL | Status: DC
  Administered 2014-03-18 – 2014-03-19 (×2): 325 mg via ORAL
  Filled 2014-03-18 (×2): qty 1

## 2014-03-18 MED ORDER — LISINOPRIL-HYDROCHLOROTHIAZIDE 20-12.5 MG PO TABS: 1.0000 | ORAL_TABLET | Freq: Every day | ORAL | Status: DC

## 2014-03-18 MED ORDER — ATORVASTATIN CALCIUM 10 MG PO TABS
20.0000 mg | ORAL_TABLET | Freq: Every day | ORAL | Status: DC
  Administered 2014-03-18 – 2014-03-19 (×2): 20 mg via ORAL
  Filled 2014-03-18 (×2): qty 2

## 2014-03-18 MED ORDER — METFORMIN HCL 500 MG PO TABS
1000.0000 mg | ORAL_TABLET | Freq: Two times a day (BID) | ORAL | Status: DC
  Administered 2014-03-19 (×2): 1000 mg via ORAL
  Filled 2014-03-18 (×3): qty 2

## 2014-03-18 MED ORDER — GLIPIZIDE ER 5 MG PO TB24
5.0000 mg | ORAL_TABLET | Freq: Every day | ORAL | Status: DC
  Administered 2014-03-19: 5 mg via ORAL
  Filled 2014-03-18: qty 1

## 2014-03-18 MED ORDER — HYDROCHLOROTHIAZIDE 12.5 MG PO CAPS
12.5000 mg | ORAL_CAPSULE | Freq: Every day | ORAL | Status: DC
  Administered 2014-03-18 – 2014-03-19 (×2): 12.5 mg via ORAL
  Filled 2014-03-18 (×2): qty 1

## 2014-03-18 MED ORDER — LISINOPRIL 20 MG PO TABS
20.0000 mg | ORAL_TABLET | Freq: Every day | ORAL | Status: DC
  Administered 2014-03-18 – 2014-03-19 (×2): 20 mg via ORAL
  Filled 2014-03-18 (×2): qty 1

## 2014-03-18 MED ORDER — TOPIRAMATE 25 MG PO TABS
50.0000 mg | ORAL_TABLET | Freq: Every day | ORAL | Status: DC
  Administered 2014-03-18 – 2014-03-19 (×2): 50 mg via ORAL
  Filled 2014-03-18 (×2): qty 2

## 2014-03-18 MED ORDER — TOPIRAMATE 25 MG PO TABS: 50.0000 mg | ORAL_TABLET | Freq: Two times a day (BID) | ORAL | Status: DC

## 2014-03-18 MED ORDER — PAROXETINE HCL 20 MG PO TABS
20.0000 mg | ORAL_TABLET | Freq: Every day | ORAL | Status: DC
  Administered 2014-03-18 – 2014-03-19 (×2): 20 mg via ORAL
  Filled 2014-03-18 (×2): qty 1

## 2014-03-18 NOTE — Progress Notes (Signed)
OT Cancellation Note  Patient Details Name: Beatrix Shipperndrea Mckellar MRN: [id number] DOB: 12/03/1965   Cancelled Treatment:    Reason Eval/Treat Not Completed: Patient not medically ready Bedrest. When eval when activity level is increased. Thanks. [facility] LLC[facility],HILLARY Brandii Lakey, OTR/L  [phone] 03/18/2014 03/18/2014, 9:23 AM

## 2014-03-18 NOTE — Progress Notes (Signed)
OT Cancellation Note  Patient Details Name: [name] MRN: [id number] DOB: 05/16/1966   Cancelled Treatment:    Reason Eval/Treat Not Completed: Patient at procedure or test/ unavailable (MRI)  [facility],HILLARY Juno Bozard, OTR/L  [phone] 03/18/2014 03/18/2014, 10:35 AM

## 2014-03-18 NOTE — Progress Notes (Signed)
Stroke Team Progress Note  HISTORY [name] is an 48 y.o. female who states she woke up at 7 AM this morning 03/17/2014  and was feeling fine. She was with her husband in the car and at 1040 went to get out of the car when she noted her left leg was weak. She then noted her left arm felt as though it had pins and needles in it. She was brought to [facility] ED where initial CT head was negative. Per notes from ED her BP was significantly elevated 220/160. Labetalol was administer to get BP down and the decision to administer tPA was made. tPA was initiated and patient was [facility] ICU. Currently patient feels her left side remains to have decreased sensation but strength has improved. She was admitted to the neuro ICU for further evaluation and treatment.  SUBJECTIVE No family is at the bedside.  Overall she feels her condition is gradually improving. She is reporting a headache yesterday with her stroke. She has past h/o stroke with residual left visual filed cut..She got TPa and overnight neurological exam improved and BP was controlled per post TPA protocol  OBJECTIVE Most recent Vital Signs: Filed Vitals:   03/18/14 0600 03/18/14 0700 03/18/14 0800 03/18/14 0900  BP: 134/64 153/78 141/86 136/72  Pulse: 92 90 91 90  Temp:      TempSrc:      Resp: 22 20 18 23   Height:      Weight:      SpO2: 98% 99% 97% 99%   CBG (last 3)   Recent Labs  03/17/14 1648 03/17/14 2053 03/18/14 0845  GLUCAP 282* 256* 284*    IV Fluid Intake:   . sodium chloride 75 mL/hr at 03/17/14 2000    MEDICATIONS  . insulin aspart  0-15 Units Subcutaneous TID WC  . pantoprazole (PROTONIX) IV  40 mg Intravenous QHS   PRN:  acetaminophen, acetaminophen, labetalol, senna-docusate  Diet:    heart healthy/carb modified thin liquids Activity:  Bedrest DVT Prophylaxis:  SCDs   CLINICALLY SIGNIFICANT STUDIES Basic Metabolic Panel: No results found for this basename: NA, K, CL, CO2, GLUCOSE,  BUN, CREATININE, CALCIUM, MG, PHOS,  in the last 168 hours Liver Function Tests: No results found for this basename: AST, ALT, ALKPHOS, BILITOT, PROT, ALBUMIN,  in the last 168 hours CBC: No results found for this basename: WBC, NEUTROABS, HGB, HCT, MCV, PLT,  in the last 168 hours Coagulation: No results found for this basename: LABPROT, INR,  in the last 168 hours Cardiac Enzymes: No results found for this basename: CKTOTAL, CKMB, CKMBINDEX, TROPONINI,  in the last 168 hours Urinalysis: No results found for this basename: COLORURINE, APPERANCEUR, LABSPEC, PHURINE, GLUCOSEU, HGBUR, BILIRUBINUR, KETONESUR, PROTEINUR, UROBILINOGEN, NITRITE, LEUKOCYTESUR,  in the last 168 hours Lipid Panel    Component Value Date/Time   CHOL 182 03/18/2014 0251   TRIG 190* 03/18/2014 0251   HDL 55 03/18/2014 0251   CHOLHDL 3.3 03/18/2014 0251   VLDL 38 03/18/2014 0251   LDLCALC 89 03/18/2014 0251   HgbA1C  No results found for this basename: HGBA1C    Urine Drug Screen:   No results found for this basename: labopia,  cocainscrnur,  labbenz,  amphetmu,  thcu,  labbarb    Alcohol Level: No results found for this basename: ETH,  in the last 168 hours   CT of the brain  03/17/2014   Atrophy with small vessel chronic ischemic changes of deep cerebral white matter.  Old RIGHT MCA territory infarct.  No new intracranial abnormalities.     MRI of the brain  ordered  MRA of the brain  ordered  Carotid Doppler  ordered  2D Echocardiogram  ordered  CXR  03/17/2014   N o active disease.   EKG  normal sinus rhythm. For complete results please see formal report.   Therapy Recommendations   Physical Exam   Pleasant middle aged obese african american lady not in distress.Awake alert. Afebrile. Head is nontraumatic. Neck is supple without bruit. Hearing is normal. Cardiac exam no murmur or gallop. Lungs are clear to auscultation. Distal pulses are well felt. Neurological Exam : Awake alert oriented x 3 normal speech and  language. Extraocular movements are full range without nystagmus. Left homonymous hemianopsia more in the inferior quadrant. Mild left lower face asymmetry. Tongue midline. No drift. Mild diminished fine finger movements on left. Orbits right over left upper extremity. Mild left grip weak.. Normal sensation . Normal coordination. ASSESSMENT [name] is a 48 y.o. female presenting with Left sided weakness and numbness. She reports a headache post onset of "twitchiness and numbness". Status post IV t-PA 03/17/2014 at 1254 [facility], [facility]. Imaging pending. Suspect a right brain stroke vs  complicated migraine.  On no antithrobmotics prior to admission. Now on no antithrombotics as within 24h of tPA for secondary stroke prevention. Patient with no new symptoms, she does have sensory and vision deficits which are felt to be old.  Stroke work up underway.  Malignant hypertension, SBP 220/160 Hyperlipidemia, LDL 89, on crestor 10 mg daily PTA, now on no statin, goal LDL < 70 for diabetics Diabetes, HgbA1c pending, goal < 7.0  Hx hormonal headaches  Morbid obesity, Body mass index is 42.45 kg/(m^2).   Hospital day # 1  TREATMENT/PLAN  Add aspirin 325 mg orally every day for secondary stroke prevention in 24h imaging negative for hemorrhage  Continue neurological checks and strict blood pressure control per post TPA protocol  F/u MRI, MRA, carotid doppler, 2D echo, HgbA1c  OOB. Therapy evals  Check EEG for seizures  Topamax 50 mg qd x 7 days then increase to BID for headache prevention  No driving  D/c IVF  SIGNED Annie MainSHARON BIBY, MSN, RN, ANVP-BC, ANP-BC, GNP-BC [facility] Pager: [phone] 03/18/2014 9:21 AM  This patient is critically ill and at significant risk of neurological worsening, death and care requires constant monitoring of vital signs, hemodynamics,respiratory and cardiac monitoring,review of multiple databases, neurological assessment,  discussion with family, other specialists and medical decision making of high complexity. I spent 30 minutes of neurocritical care time  in the care of  this patient. I have personally obtained a history, examined the patient, evaluated imaging films, and formulated the assessment and plan of care. I agree with the above. Delia HeadyPramod Winona Sison, MD   To contact Stroke Continuity provider, please refer to [email or web address]. After hours, contact General Neurology

## 2014-03-18 NOTE — Progress Notes (Signed)
EEG Completed; Results Pending  

## 2014-03-18 NOTE — Evaluation (Signed)
Physical Therapy Evaluation Patient Details Name: [name] MRN: [id number] DOB: 05/07/1966 Today's Date: 03/18/2014   History of Present Illness  48 y.o. female with acute onset of left leg weakness and left sided decreased sensation in the setting of elevated BP of 220/120. Patient was administered tPA while in [place] ED and [facility]. BP elevated.  Patient with multiple stroke risk factors and has had a stroke in the past.  On no antiplatelet therapy at home.  Head CT reviewed and shows no acute changes.  MRI pending.  Clinical Impression  Patient demonstrates deficits in functional mobility as indicated below. Will benefit from continued skilled PT to address deficits and maximize function. Will see as indicated and progress as tolerated.     Follow Up Recommendations Home health PT;Supervision/Assistance - 24 hour    Equipment Recommendations  Rolling walker with 5" wheels    Recommendations for Other Services       Precautions / Restrictions Precautions Precautions: Fall Restrictions Weight Bearing Restrictions: No      Mobility  Bed Mobility Overal bed mobility: Modified Independent             General bed mobility comments: increased time to perform, heavy reliance on bed rail  Transfers Overall transfer level: Needs assistance Equipment used: None Transfers: Sit to/from Stand Sit to Stand: Min guard         General transfer comment: Min guard for stability  Ambulation/Gait Ambulation/Gait assistance: Supervision Ambulation Distance (Feet): 150 Feet Assistive device: Rolling walker (2 wheeled) Gait Pattern/deviations: Step-through pattern;Antalgic;Decreased dorsiflexion - left;Trunk flexed Gait velocity: decreased Gait velocity interpretation: Below normal speed for age/gender General Gait Details: VCs for navigation around objects secondary to visual deficits, some instability noted, patient reports increased  comfort with RW  Stairs            Wheelchair Mobility    Modified Rankin (Stroke Patients Only)       Balance                                             Pertinent Vitals/Pain No pain reported at this time    Home Living Family/patient expects to be discharged to:: Private residence Living Arrangements: Spouse/significant other;Children Available Help at Discharge: Available 24 hours/day (spouse works night, children are home ) Type of Home: House Home Access: Stairs to enter Entrance Stairs-Rails: None Entrance Stairs-Number of Steps: 4 Home Layout: Two level;Able to live on main level with bedroom/bathroom Home Equipment: Cane - quad Additional Comments: had tub shower with curtain and standard height toilets    Prior Function Level of Independence: Needs assistance   Gait / Transfers Assistance Needed: assist from husband for stair negotiation  ADL's / Homemaking Assistance Needed: patient does not perform cooking secondary to visual deficits        Hand Dominance   Dominant Hand: Right    Extremity/Trunk Assessment   Upper Extremity Assessment: Defer to OT evaluation           Lower Extremity Assessment: LLE deficits/detail         Communication   Communication:  (visual deficits)  Cognition Arousal/Alertness: Awake/alert Behavior During Therapy: WFL for tasks assessed/performed Overall Cognitive Status: Within Functional Limits for tasks assessed  General Comments General comments (skin integrity, edema, etc.): patient with deficit in vision impacting mobility    Exercises        Assessment/Plan    PT Assessment Patient needs continued PT services  PT Diagnosis Difficulty walking;Abnormality of gait;Generalized weakness   PT Problem List Decreased strength;Decreased activity tolerance;Decreased balance;Decreased mobility;Impaired sensation  PT Treatment Interventions DME  instruction;Gait training;Stair training;Functional mobility training;Therapeutic activities;Therapeutic exercise;Balance training;Patient/family education   PT Goals (Current goals can be found in the Care Plan section) Acute Rehab PT Goals Patient Stated Goal: to go home PT Goal Formulation: With patient Time For Goal Achievement: 04/01/14 Potential to Achieve Goals: Good    Frequency Min 4X/week   Barriers to discharge        Co-evaluation               End of Session Equipment Utilized During Treatment: Gait belt Activity Tolerance: Patient tolerated treatment well Patient left: in chair;with call bell/phone within reach Nurse Communication: Mobility status         Time: [id number] PT Time Calculation (min): 27 min   Charges:   PT Evaluation $Initial PT Evaluation Tier I: 1 Procedure PT Treatments $Gait Training: 8-22 mins $Therapeutic Activity: 8-22 mins   PT G CodesFabio Asa:          [name] J 03/18/2014, 10:29 AM Charlotte Crumbevon Jaymarie Yeakel, PT DPT  [phone]

## 2014-03-18 NOTE — Progress Notes (Addendum)
Inpatient Diabetes Program Recommendations  AACE/ADA: New Consensus Statement on Inpatient Glycemic Control (2013)  Target Ranges:  Prepandial:   less than 140 mg/dL      Peak postprandial:   less than 180 mg/dL (1-2 hours)      Critically ill patients:  140 - 180 mg/dL   Reason for Visit: Results for Beatrix ShipperLEE, Imojean (MRN [id number]) as of 03/18/2014 14:04  Ref. Range 03/17/2014 16:48 03/17/2014 20:53 03/18/2014 08:45 03/18/2014 12:55  Glucose-Capillary Latest Range: 70-99 mg/dL 409282 (H) 811256 (H) 914284 (H) 208 (H)   Diabetes history: Type 2 Diabetes Outpatient Diabetes medications: Metformin 1000 mg bid with meals, Glipizide XL 5 mg daily  Current orders for Inpatient glycemic control:  Please order A1C to determine pre-hospitalization glycemic control.   CBG's continue to be greater than goal.  Consider adding basal insulin such as Levemir 20 units daily.  Patient will need follow-up with PCP regarding diabetes.  Thanks, Beryl MeagerJenny Inell Mimbs, RN, BC-ADM Inpatient Diabetes Coordinator Pager [phone]

## 2014-03-18 NOTE — Progress Notes (Signed)
Patient just got transfered to bed 4N28. Patient is alert and oriented, and in no acute distress. Respirations even and unlabored, no complaint of pain. Patient is oriented to room and equipment. Will continue to monitor.

## 2014-03-18 NOTE — Progress Notes (Signed)
UR completed.  Shaquel Josephson, RN BSN MHA CCM Trauma/Neuro ICU Case Manager [phone]  

## 2014-03-19 DIAGNOSIS — R51 Headache: Secondary | ICD-10-CM | POA: Diagnosis present

## 2014-03-19 DIAGNOSIS — R519 Headache, unspecified: Secondary | ICD-10-CM | POA: Diagnosis present

## 2014-03-19 DIAGNOSIS — I10 Essential (primary) hypertension: Secondary | ICD-10-CM | POA: Diagnosis present

## 2014-03-19 DIAGNOSIS — E785 Hyperlipidemia, unspecified: Secondary | ICD-10-CM | POA: Diagnosis present

## 2014-03-19 DIAGNOSIS — E119 Type 2 diabetes mellitus without complications: Secondary | ICD-10-CM | POA: Diagnosis present

## 2014-03-19 LAB — GLUCOSE, CAPILLARY
GLUCOSE-CAPILLARY: 253 mg/dL — AB (ref 70–99)
GLUCOSE-CAPILLARY: 264 mg/dL — AB (ref 70–99)
GLUCOSE-CAPILLARY: 205 mg/dL — AB (ref 70–99)

## 2014-03-19 MED ORDER — INSULIN STARTER KIT- SYRINGES (ENGLISH)
1.0000 | Freq: Once | Status: AC
  Administered 2014-03-19: 1
  Filled 2014-03-19: qty 1

## 2014-03-19 MED ORDER — ASPIRIN 325 MG PO TBEC: 325.0000 mg | DELAYED_RELEASE_TABLET | Freq: Every day | ORAL | Status: DC

## 2014-03-19 MED ORDER — TOPIRAMATE 50 MG PO TABS: 50.0000 mg | ORAL_TABLET | Freq: Every day | ORAL | Status: DC

## 2014-03-19 NOTE — Progress Notes (Signed)
Patient education and paperwork completed.  Patient educated regarding insulin usage and demonstrated giving herself her insulin injections.  IV removed and patient leaving with her husband.  Lance BoschAnna Sansa Alkema, RN

## 2014-03-19 NOTE — Progress Notes (Signed)
Stroke Team Progress Note  HISTORY [name] is an 48 y.o. female who states she woke up at 7 AM this morning 03/17/2014  and was feeling fine. She was with her husband in the car and at 1040 went to get out of the car when she noted her left leg was weak. She then noted her left arm felt as though it had pins and needles in it. She was brought to [facility] ED where initial CT head was negative. Per notes from ED her BP was significantly elevated 220/160. Labetalol was administer to get BP down and the decision to administer tPA was made. tPA was initiated and [facility]. Currently patient feels her left side remains to have decreased sensation but strength has improved. She was admitted to the neuro ICU for further evaluation and treatment.  SUBJECTIVE Patient and her husband are in the room. They are anxious to go home.  OBJECTIVE Most recent Vital Signs: Filed Vitals:   03/18/14 2031 03/19/14 0100 03/19/14 0540 03/19/14 0934  BP: 158/87 154/85 137/68 132/72  Pulse: 93 94 91 84  Temp: 98.3 F (36.8 C) 98.5 F (36.9 C) 98.5 F (36.9 C) 97.4 F (36.3 C)  TempSrc: Oral Oral Oral Oral  Resp: 20 20 20 20   Height:      Weight: 106.3 kg (234 lb 5.6 oz)     SpO2: 100% 99% 99% 99%   CBG (last 3)   Recent Labs  03/18/14 1659 03/18/14 2109 03/19/14 0618  GLUCAP 176* 323* 253*    IV Fluid Intake:      MEDICATIONS  . aspirin EC  325 mg Oral Daily  . atorvastatin  20 mg Oral q1800  . glipiZIDE  5 mg Oral Daily  . hydrochlorothiazide  12.5 mg Oral Daily  . insulin aspart  0-15 Units Subcutaneous TID WC  . lisinopril  20 mg Oral Daily  . metFORMIN  1,000 mg Oral BID WC  . PARoxetine  20 mg Oral Daily  . topiramate  50 mg Oral Daily   Followed by  . [START ON 03/25/2014] topiramate  50 mg Oral BID   PRN:  acetaminophen, acetaminophen, labetalol, senna-docusate  Diet:    heart healthy/carb modified thin liquids Activity:  OOB DVT Prophylaxis:   SCDs   CLINICALLY SIGNIFICANT STUDIES Basic Metabolic Panel: No results found for this basename: NA, K, CL, CO2, GLUCOSE, BUN, CREATININE, CALCIUM, MG, PHOS,  in the last 168 hours Liver Function Tests: No results found for this basename: AST, ALT, ALKPHOS, BILITOT, PROT, ALBUMIN,  in the last 168 hours CBC: No results found for this basename: WBC, NEUTROABS, HGB, HCT, MCV, PLT,  in the last 168 hours Coagulation: No results found for this basename: LABPROT, INR,  in the last 168 hours Cardiac Enzymes: No results found for this basename: CKTOTAL, CKMB, CKMBINDEX, TROPONINI,  in the last 168 hours Urinalysis: No results found for this basename: COLORURINE, APPERANCEUR, LABSPEC, PHURINE, GLUCOSEU, HGBUR, BILIRUBINUR, KETONESUR, PROTEINUR, UROBILINOGEN, NITRITE, LEUKOCYTESUR,  in the last 168 hours Lipid Panel    Component Value Date/Time   CHOL 182 03/18/2014 0251   TRIG 190* 03/18/2014 0251   HDL 55 03/18/2014 0251   CHOLHDL 3.3 03/18/2014 0251   VLDL 38 03/18/2014 0251   LDLCALC 89 03/18/2014 0251   HgbA1C  Lab Results  Component Value Date   HGBA1C 11.2* 03/18/2014    Urine Drug Screen:   No results found for this basename: labopia,  cocainscrnur,  labbenz,  amphetmu,  thcu,  labbarb    Alcohol Level: No results found for this basename: ETH,  in the last 168 hours   CT of the brain  03/17/2014   Atrophy with small vessel chronic ischemic changes of deep cerebral white matter.  Old RIGHT MCA territory infarct.  No new intracranial abnormalities.   MRI of the brain  03/18/2014   1. No evidence of acute intracranial abnormality. 2. Remote, moderately large right MCA territory infarct. 3. Chronic small vessel ischemic disease, advanced for age.   MRA of the brain  03/18/2014    Advanced intracranial atherosclerosis involving anterior and posterior circulation   Carotid Doppler    2D Echocardiogram  EF 60% with no source of embolus.   CXR  03/17/2014   N o active disease.   EKG  normal sinus rhythm.  For complete results please see formal report.   EEG This is an abnormal electroencephalogram due to the asymmetry of activity. Background activity over the right hemisphere was slow and markedly attenuated compared to the left hemisphere. This suggests a focal abnormality of nonspecific etiology.   Therapy Recommendations HH PT  Physical Exam   Pleasant middle aged obese african american lady not in distress.Awake alert. Afebrile. Head is nontraumatic. Neck is supple. Hearing is grossly normal. Distal pulses are well felt. Neurological Exam : Awake alert oriented x 3 normal speech and language. Extraocular movements are full range without nystagmus. VFF. Face symmetric. Tongue midline. No drift. No drift upper extremities. Find motor movement intact. No orbiting present. Lower extremity strength normal. Normal sensation . Normal coordination.  ASSESSMENT [name] is a 48 y.o. female presenting with Left sided weakness and numbness. She reports a headache post onset of "twitchiness and numbness". Status post IV t-PA 03/17/2014 at 1254 [facility], [facility]. Imaging negative for acute stroke. Dx:  right brain [name] vs  complicated migraine.  On no antithrobmotics prior to admission. Now on aspirin 325 mg orally every day for secondary stroke prevention. Patient with no new symptoms, she does have sensory and vision deficits which are all old.  Stroke work up completed except for carotid doppler.   Malignant hypertension, SBP 220/160 Hyperlipidemia, LDL 89, on crestor 10 mg daily PTA, now on no statin, goal LDL < 70 for diabetics Diabetes, type 2, uncontrolled, HgbA1c 11.2, goal < 7.0. On Metformin 1000mg  bid with meals,,Glipizoed XL 5 mg daily. Recommendations for consideration of adding basal insulin (Levimir 20u daily)  Headache  Hx hormonal headaches  Placed on Topamax 50 mg qd x 7 days then increase to BID for headache prevention  Morbid obesity, Body mass index is 42.85  kg/(m^2).   Hospital day # 2  TREATMENT/PLAN  Add aspirin 325 mg orally every day for secondary stroke prevention in 24h imaging negative for hemorrhage  OP PT, RW with 5" wheels  OP carotid doppler pending  Recommend follow up with primary MD to address insulin recommendations. Patient agreeable  Ongoing risk factor control: weight loss, BP, lipids and glucose control, wt loss, exercise  SIGNED Annie MainSHARON BIBY, MSN, RN, ANVP-BC, ANP-BC, GNP-BC [facility] Pager: [phone] 03/19/2014 10:53 AM  I have personally obtained a history, examined the patient, evaluated imaging films, and formulated the assessment and plan of care. I agree with the above.  Delia HeadyPramod Berlene Dixson, MD   To contact Stroke Continuity provider, please refer to [email or web address]. After hours, contact General Neurology

## 2014-03-19 NOTE — Progress Notes (Signed)
VASCULAR LAB PRELIMINARY  PRELIMINARY  PRELIMINARY  PRELIMINARY  Carotid duplex completed.    Preliminary report:  1-39% ICA stenosis.  Left distal ICA difficult to visualize.  Vertebral artery flow antegrade. Elevated velocities in the distal left CCA.  Johny Pitstick, RVT 03/19/2014, 5:36 PM

## 2014-03-19 NOTE — Evaluation (Signed)
Occupational Therapy Evaluation Patient Details Name: [name] MRN: [id number] DOB: 05/26/1966 Today's Date: 03/19/2014    History of Present Illness   48 y.o. female with acute onset of left leg weakness and left sided decreased sensation in the setting of elevated BP of 220/120. Patient was administered tPA while in [place] ED and transferred to [facility] for further management. BP elevated. Patient with multiple stroke risk factors and has had a stroke in the past. On no antiplatelet therapy at home. Head CT reviewed and shows no acute changes. MRI negative for acute changes.    Clinical Impression   Pt admitted with above. Will benefit form acute OT services to addrses below problem list. Recommend HHOT for d/c planning.    Follow Up Recommendations  Home health OT;Supervision/Assistance - 24 hour    Equipment Recommendations  None recommended by OT    Recommendations for Other Services       Precautions / Restrictions        Mobility Bed Mobility Overal bed mobility: Modified Independent                Transfers Overall transfer level: Needs assistance   Transfers: Sit to/from Stand Sit to Stand: Min guard         General transfer comment: Min guard for stability    Balance                                            ADL Overall ADL's : Needs assistance/impaired                     Lower Body Dressing: Minimal assistance;Sitting/lateral leans Lower Body Dressing Details (indicate cue type and reason): donning socks Toilet Transfer: Min guard;Ambulation;Comfort height toilet   Toileting- Clothing Manipulation and Hygiene: Minimal assistance;Sit to/from stand       Functional mobility during ADLs: Min guard General ADL Comments: Pt with limited ADL participation due to headache.  OT provided theraputty and verbally instructed on exercises (rolling and pinching) to increase left hand strength and fine motor  coordination.  Pt reports her family assists with ADLs due to her visual deficits. She used to have glassess but lost them. Can not afford new pair.  Pt states she is going to see her eye doctor on July 14th.     Vision                 Additional Comments: Unable to participate in formal test due to headache. States things are often too bright, even at baseline. Typically wears sunglasses. Left field cut.   Perception     Praxis      Pertinent Vitals/Pain See vitals tab     Hand Dominance Right   Extremity/Trunk Assessment Upper Extremity Assessment Upper Extremity Assessment: LUE deficits/detail LUE Deficits / Details: 3/5 elbow,wrist and hand. 2+/5 in shoulder LUE Sensation: decreased light touch;decreased proprioception LUE Coordination: decreased fine motor           Communication Communication Communication: No difficulties   Cognition Arousal/Alertness: Awake/alert Behavior During Therapy: WFL for tasks assessed/performed Overall Cognitive Status: Within Functional Limits for tasks assessed                     General Comments       Exercises       Shoulder Instructions  Home Living Family/patient expects to be discharged to:: Private residence Living Arrangements: Spouse/significant other;Children Available Help at Discharge: Available 24 hours/day Type of Home: House Home Access: Stairs to enter Entergy CorporationEntrance Stairs-Number of Steps: 4 Entrance Stairs-Rails: None Home Layout: Two level;Able to live on main level with bedroom/bathroom     Bathroom Shower/Tub: Chief Strategy OfficerTub/shower unit   Bathroom Toilet: Standard     Home Equipment: Cane - quad          Prior Functioning/Environment Level of Independence: Needs assistance  Gait / Transfers Assistance Needed: assist from husband for stair negotiation ADL's / Homemaking Assistance Needed: patient does not perform cooking secondary to visual deficits        OT Diagnosis: Generalized  weakness;Disturbance of vision;Paresis   OT Problem List: Decreased strength;Decreased activity tolerance;Impaired balance (sitting and/or standing);Impaired vision/perception;Pain;Impaired UE functional use;Impaired sensation   OT Treatment/Interventions: Self-care/ADL training;DME and/or AE instruction;Therapeutic activities;Visual/perceptual remediation/compensation;Patient/family education;Balance training    OT Goals(Current goals can be found in the care plan section) Acute Rehab OT Goals Patient Stated Goal: to go home OT Goal Formulation: With patient Time For Goal Achievement: 03/26/14 Potential to Achieve Goals: Good  OT Frequency: Min 2X/week   Barriers to D/C:            Co-evaluation              End of Session Equipment Utilized During Treatment: Gait belt Nurse Communication: Mobility status  Activity Tolerance: Patient limited by pain (headache) Patient left: in bed;with call bell/phone within reach;with family/visitor present   Time: 0850-0920 OT Time Calculation (min): 30 min Charges:  OT Evaluation $Initial OT Evaluation Tier I: 1 Procedure OT Treatments $Self Care/Home Management : 8-22 mins $Therapeutic Activity: 8-22 mins G-Codes:    Cipriano MileJohnson, Yancy Hascall Elizabeth 03/19/2014, 3:48 PM  03/19/2014 Cipriano MileJohnson, Kentravious Lipford Elizabeth OTR/L Pager [phone] Office [phone]

## 2014-03-19 NOTE — Procedures (Signed)
ELECTROENCEPHALOGRAM REPORT   Patient: [name]       Room #: 1O104N28 EEG No. ID: [id number] Age: 48 y.o.        Sex: female Referring Physician: Pearlean BrownieSethi Report Date:  03/18/2014        Interpreting Physician: Thana FarrEYNOLDS,Khadijah Mastrianni D  History: [name] Shipperndrea Lepkowski is an 48 y.o. female admitted with complaints of headache and left hemiparesis  Medications:  Scheduled: . aspirin EC  325 mg Oral Daily  . atorvastatin  20 mg Oral q1800  . glipiZIDE  5 mg Oral Daily  . hydrochlorothiazide  12.5 mg Oral Daily  . insulin aspart  0-15 Units Subcutaneous TID WC  . lisinopril  20 mg Oral Daily  . metFORMIN  1,000 mg Oral BID WC  . PARoxetine  20 mg Oral Daily  . topiramate  50 mg Oral Daily   Followed by  . [START ON 03/25/2014] topiramate  50 mg Oral BID    Conditions of Recording:  This is a 16 channel EEG carried out with the patient in the awake and drowsy states.  Description:  The waking background activity consists of a low voltage, fairly well organized, 10 Hz alpha activity, seen from the parieto-occipital and posterior temporal regions on the left hemisphere.  This activity is much less prominent over the right hemisphere and when seen is poorly sustained.  Low voltage fast activity, poorly organized, is seen anteriorly and is at times superimposed on more posterior regions.  A mixture of theta and alpha rhythms are seen from the central and temporal regions.  Again this is only noted over the left hemisphere.  Throughout the entire tracing activity over the right hemisphere is markedly attenuated.   The patient drowses with slowing to irregular, low voltage theta and beta activity, again most prominent over the left hemisphere with the left hemispheric attenuation continuing into drowse.    Stage II sleep is not obtained. Hyperventilation and intermittent photic stimulation were not performed.  IMPRESSION: This is an abnormal electroencephalogram due to the asymmetry of activity.  Background activity over  the right hemisphere was slow and markedly attenuated compared to the left hemisphere.  This suggests a focal abnormality of nonspecific etiology.    Thana FarrLeslie Lakenya Riendeau, MD [facility] [phone] 03/18/2014, 7:49 PM

## 2014-03-19 NOTE — Discharge Summary (Signed)
Stroke Discharge Summary  Patient ID: [name]   MRN: [id number]      DOB: 07-02-1966  Date of Admission: 03/17/2014 Date of Discharge: 03/19/2014  Attending Physician:  Suzzanne Cloud, MD, Stroke MD  Consulting Physician(s):     None  Patient's PCP:  PROVIDER NOT IN SYSTEM  Discharge Diagnoses:  Principal Problem:   R brain [name] (transient ischemic attack) complicated migraine s/p IV tPA Active Problems:   Malignant HTN (hypertension)   Diabetes   Hyperlipidemia   Headache(784.0)   Severe obesity (BMI >= 40)   Possible L ICA occlusion/stenosis  BMI: Body mass index is 42.85 kg/(m^2).  Past Medical History  Diagnosis Date  . HTN (hypertension)   . Diabetes   . Hyperlipidemia   . Depression    No past surgical history on file.    Medication List         aspirin 325 MG EC tablet  Take 1 tablet (325 mg total) by mouth daily.     glipiZIDE 5 MG 24 hr tablet  Commonly known as:  GLUCOTROL XL  Take 5 mg by mouth daily.     Iron Tabs  Take 1 tablet by mouth daily. Strength unknown. otc.     lisinopril-hydrochlorothiazide 20-12.5 MG per tablet  Commonly known as:  PRINZIDE,ZESTORETIC  Take 1 tablet by mouth daily.     metFORMIN 1000 MG tablet  Commonly known as:  GLUCOPHAGE  Take 1,000 mg by mouth 2 (two) times daily with a meal.     PARoxetine 20 MG tablet  Commonly known as:  PAXIL  Take 20 mg by mouth daily.     rosuvastatin 10 MG tablet  Commonly known as:  CRESTOR  Take 10 mg by mouth daily.     topiramate 50 MG tablet  Commonly known as:  TOPAMAX  Take 1 tablet (50 mg total) by mouth daily.        LABORATORY STUDIES Lipid Panel    Component Value Date/Time   CHOL 182 03/18/2014 0251   TRIG 190* 03/18/2014 0251   HDL 55 03/18/2014 0251   CHOLHDL 3.3 03/18/2014 0251   VLDL 38 03/18/2014 0251   LDLCALC 89 03/18/2014 0251   HgbA1C  Lab Results  Component Value Date   HGBA1C 11.2* 03/18/2014    SIGNIFICANT DIAGNOSTIC STUDIES CT of the brain  03/17/2014 Atrophy with small vessel chronic ischemic changes of deep cerebral white matter. Old RIGHT MCA territory infarct. No new intracranial abnormalities.  MRI of the brain 03/18/2014 1. No evidence of acute intracranial abnormality. 2. Remote, moderately large right MCA territory infarct. 3. Chronic small vessel ischemic disease, advanced for age.  MRA of the brain 03/18/2014 Advanced intracranial atherosclerosis involving anterior and posterior circulation  Carotid Doppler 1-39% ICA stenosis. Left distal ICA difficult to visualize. Vertebral artery flow antegrade. Elevated velocities in the distal left CCA. 2D Echocardiogram EF 60% with no source of embolus.  CXR 03/17/2014 N o active disease.  EKG normal sinus rhythm. For complete results please see formal report.  EEG This is an abnormal electroencephalogram due to the asymmetry of activity. Background activity over the right hemisphere was slow and markedly attenuated compared to the left hemisphere. This suggests a focal abnormality of nonspecific etiology.      History of Present Illness   [name] is an 48 y.o. female who states she woke up at 7 AM this morning 03/17/2014 and was feeling fine. She was with her husband in  the car and at 1040 went to get out of the car when she noted her left leg was weak. She then noted her left arm felt as though it had pins and needles in it. She was brought to [facility] ED where initial CT head was negative. Per notes from ED her BP was significantly elevated 220/160. Labetalol was administer to get BP down and the decision to administer tPA was made. tPA was initiated and patient was transferred to [facility] neuro ICU. Currently patient feels her left side remains to have decreased sensation but strength has improved. She was admitted to the neuro ICU for further evaluation and treatment.   Hospital Course Patient tolerated tPA without complication. Imaging at 24 hours shows no hemorrhage. MRI Imaging negative  for acute stroke. Dx: right brain [name] vs complicated migraine. On no antithrobmotics prior to admission. Now on aspirin 325 mg orally every day for secondary stroke prevention.   Patient with vascular risk factors of:  Malignant hypertension, SBP 220/160, initially treated with labetolol in order to administer IV tPA safely. Normalizing in hospital. Home BP medications were resumed. Hyperlipidemia, LDL 89, on crestor 10 mg daily PTA, now on lipitor 20 mg in hospital, no quite at goal LDL < 70 for diabetics. crestor resumed at discharge.  Diabetes, type 2, uncontrolled, HgbA1c 11.2, goal < 7.0. On Metformin 1022m bid with meals and Glipizoed XL 5 mg daily. Diabetic RN made recommendations for consideration of adding basal insulin (Levimir 20u daily). RN began insulin instruction with pateint with vial and syringe teaching kit. Patient concern is for expense of insulin. Recommends Reli-on brand 70/[facility] OP referral placed and information faxed to center.  Morbid obesity, Body mass index is 42.85 kg/(m^2).  ? L ICA stenosis/occlusion. Not well seen on carotid doppler. This is on the asymptomatic side.   Patient also with: Headache  - Hx hormonal headaches, Placed on Topamax 50 mg qd x 7 days with plans to increase to BID for headache prevention. Patient has tolerated well thus far.  Patient with no new symptoms, she does have sensory and vision deficits which are all old. Physical therapy, occupational therapy and speech therapy evaluated patient. They recommend home health PT and OT with RW with 5 inch wheels. Patient has transportation to therapy. Have ordered outpatient PT and OT follow-up.  Discharge Exam  Blood pressure 153/79, pulse 88, temperature 97.9 F (36.6 C), temperature source Oral, resp. rate 20, height 5' 2"  (1.575 m), weight  106.3 kg (234 lb 5.6 oz), last menstrual period 03/16/2014, SpO2 98.00%.  Pleasant middle aged obese african american lady not in distress.Awake alert. Afebrile. Head is nontraumatic. Neck is supple. Hearing is grossly normal. Distal pulses are well felt.  Neurological Exam : Awake alert oriented x 3 normal speech and language. Extraocular movements are full range without nystagmus. VFF. Face symmetric. Tongue midline. No drift. No drift upper extremities. Find motor movement intact. No orbiting present. Lower extremity strength normal. Normal sensation . Normal coordination.  Discharge Diet     heart healthy/carb modified thin liquids  Discharge Plan    Disposition:  Home with husband   aspirin 325 mg orally every day for secondary stroke prevention.  Outpatient PT and OT, rolling walker w/ 5 inch wheels  Outpatient   [facility]  Ongoing risk factor control by Primary Care Physician. Risk factor recommendations:  Hypertension target range 130-140/70-80, Lipid range - LDL < 100 and checked every 6 months, fasting, Diabetes - HgB A1C <7, weight loss     Follow-up PROVIDER NOT IN SYSTEM in 2 weeks. (MD in [place], f/u appt 03/26/2014 already in place.  Follow-up with Dr. Erlinda Hong, [facility] in 2 months.  50 minutes were spent preparing discharge.  Signed Burnetta Sabin, MSN, RN, ANVP-BC, ANP-BC, GNP-BC [facility] Pager: [phone] 03/19/2014 5:49 PM   I have personally examined this patient, reviewed pertinent data and developed the plan of care. I agree with above. Antony Contras, MD

## 2014-03-19 NOTE — Progress Notes (Signed)
Discharge delayed awaiting carotid doppler results.  CT angio neck ordered at this time as carotids have not been done. Have asked vascular not to do carotids if angio done prior to their arrival.  Plan discharge tomorrow.  [name] MainSHARON BIBY, MSN, RN, ANVP-BC, ANP-BC, GNP-BC [facility] Pager: [phone] 03/19/2014 5:35 PM

## 2014-03-19 NOTE — Progress Notes (Addendum)
Inpatient Diabetes Program Recommendations  AACE/ADA: New Consensus Statement on Inpatient Glycemic Control (2013)  Target Ranges:  Prepandial:   less than 140 mg/dL      Peak postprandial:   less than 180 mg/dL (1-2 hours)      Critically ill patients:  140 - 180 mg/dL      Results for ALYSSAMARIE, MOUNSEY (MRN [id number]) as of 03/19/2014 13:09  Ref. Range 03/18/2014 08:45 03/18/2014 12:55 03/18/2014 16:59 03/18/2014 21:09  Glucose-Capillary Latest Range: 70-99 mg/dL 284 (H) 208 (H) 176 (H) 323 (H)    Results for SLOANE, PALMER (MRN [id number]) as of 03/19/2014 13:09  Ref. Range 03/19/2014 06:18 03/19/2014 12:01  Glucose-Capillary Latest Range: 70-99 mg/dL 253 (H) 264 (H)    Results for NAVEH, RICKLES (MRN [id number]) as of 03/19/2014 13:09  Ref. Range 03/18/2014 02:51  Hemoglobin A1C Latest Range: <5.7 % 11.2 (H)     Admitted with CVA.  History of DM2.  Home DM Meds:   Victoza 1.8 mg daily (patient states she hasn't taken Victoza in over 2 months due to cost) Metformin 1000 mg bid Glipizide 5 mg daily  PCP: Dr. Rosario Jacks in [facility] to patient about her current A1c of 11.2%.  Explained what an A1c is and what it measures.  Reminded patient that her goal A1c is 7% or less per ADA standards to prevent both acute and long-term complications.  Encouraged patient to continue to check her CBGs at tid at home and to record all CBGs in a logbook for her PCP to review.  Patient told me she has been checking her CBGs tid at home and that she usually gets readings around 240-250 mg/dl.  Discussed with patient the importance of good CBG control to prevent both acute and chronic complications.  Reviewed ideal CBG goals and also reviewed basic DM dietary information with patient.  Encouraged patient to avoid fruit juice, sweet tea, and regular soda.  Also encouraged patient to limit carbohydrate portion sizes and encouraged patient to try to keep her carbohydrates around 60 grams per meal.  While I was in the room,  patient's husband brought patient lunch and a large can of [place] tea.  Reviewed the nutrition label of the [place] with patient and husband and taught them both how to read a food label.  If patient drank the entire can of tea she would consume 54 grams of carbs just in the tea alone.  Patient surprised to learn that drinks could have that much carbohydrate.  Patient is interested in going to the DM Lifestyle center at [facility] for further DM education.  OP referral placed and information faxed to center.    Based on the above information, I am not surprised that patient's A1c was so high.  Discussed with patient that she may need insulin at d/c.  Patient is open to taking insulin, however, she is concerned about the price.  Patient can get 70/30 insulin in vial form at [facility] for $25 per vial.  Will have RNs begin insulin instruction with patient with vial and syringe teaching kit in case decision made to send her home on insulin.  MD- If you choose to send patient home on insulin, please keep cost in mind.  As aforementioned, Reli-on brand 70/30 insulin can be purchased at [facility] for $25 per vial.  May want to start some 70/30 insulin here in hospital to assess how much insulin she may need for home.  Could start with 70/30 insulin  15 units bid with breakfast and supper and titrate based on CBGs (15 units bid would be equivalent to 0.2 units/kg dosing for basal insulin alone)    Will follow Wyn Quaker RN, MSN, CDE Diabetes Coordinator Inpatient Diabetes Program Team Pager: [phone] (8a-10p)

## 2014-03-19 NOTE — Evaluation (Signed)
SLP Cancellation Note  Patient Details Name: Beatrix Shipperndrea Maxham MRN: [id number] DOB: 01/15/1966   Cancelled treatment:        pt with OT currently, will reattempt evaluation as schedule allows. Donavan Burnetamara Keonda Dow, MS [facility] SLP [phone]                                                                                   Chales AbrahamsKimball, Sharanya Templin Ann 03/19/2014, 9:17 AM

## 2014-03-19 NOTE — Progress Notes (Signed)
Nutrition Brief Note  Patient identified on the Malnutrition Screening Tool (MST) Report  Wt Readings from Last 15 Encounters:  03/18/14 234 lb 5.6 oz (106.3 kg)    Body mass index is 42.85 kg/(m^2). Patient meets criteria for Morbid Obesity based on current BMI. Pt reports that one year ago she weighed 260 lbs. She has been trying to lose weight and has had gradual weight loss over the past year. She states her appetite is good. She is interested in learning more about carbohydrates and healthful eating.  Lab Results  Component Value Date   HGBA1C 11.2* 03/18/2014    RD provided "Carbohydrate Counting for People with Diabetes" handout from the Academy of Nutrition and Dietetics. Discussed different food groups and their effects on blood sugar, emphasizing carbohydrate-containing foods. Provided list of carbohydrates and recommended serving sizes of common foods.  Discussed importance of controlled and consistent carbohydrate intake throughout the day. Provided examples of ways to balance meals/snacks and encouraged intake of high-fiber, whole grain complex carbohydrates. Provide "1800-Calorie 5 -Day sample Menus"Teach back method used.  Expect fair compliance.  Current diet order is Heart Healthy/Carb Modified, patient is consuming approximately 100% of meals at this time. Labs and medications reviewed. No further nutrition interventions warranted at this time. RD contact information provided. If additional nutrition issues arise, please re-consult RD.  Ian Malkineanne Barnett RD, LDN Inpatient Clinical Dietitian Pager: [phone] After Hours Pager: [phone]

## 2014-03-19 NOTE — Care Management Note (Unsigned)
    Page 1 of 1   03/19/2014     4:11:10 PM CARE MANAGEMENT NOTE 03/19/2014  Patient:  [name], [name]   Account Number:  [id number]  Date Initiated:  03/19/2014  Documentation initiated by:  Lorne Skeens  Subjective/Objective Assessment:   Patient admitted with CVA symptoms. Lives at home with spouse.     Action/Plan:   Will follow for discharge needs pending PT/OT evals and physician orders.   Anticipated DC Date:  03/19/2014   Anticipated DC Plan:  [place]  CM consult      Choice offered to / List presented to:  C-1 Patient   DME arranged  [facility]ile      DME agency  [facility].        Status of service:  Completed, signed off Medicare Important Message given?   (If response is "NO", the following Medicare IM given date fields will be blank) Date Medicare IM given:   Medicare IM given by:   Date Additional Medicare IM given:   Additional Medicare IM given by:    Discharge Disposition:    Per UR Regulation:  Reviewed for med. necessity/level of care/duration of stay  If discussed at [place] of Stay Meetings, dates discussed:    Comments:  03/19/14 [place], MSN, CM- Met with patient to discuss outpatient PT.  Patient is agreeable and would like to go to [facility], as it is close to her home.  CM left voicemail at [facility] outpatient rehab and faxed orders.  CM will follow up on Monday to ensure that orders were recieved.  [facility] was notified of order for rolling walker for discharge home today.

## 2014-03-19 NOTE — Progress Notes (Signed)
Physical Therapy Treatment Patient Details Name: [name] MRN: [id number] DOB: 01/10/1966 Today's Date: 03/19/2014    History of Present Illness 48 y.o. female with acute onset of left leg weakness and left sided decreased sensation in the setting of elevated BP of 220/120. Patient was administered tPA while in [place] ED and [facility] management. BP elevated.  Patient with multiple stroke risk factors and has had a stroke in the past.  On no antiplatelet therapy at home.  Head CT reviewed and shows no acute changes.  MRI negative for acute events    PT Comments    Pt continues to demonstrate more stable gait with RW vs attempted quad cane today.  Due to increased left sided weakness, increased visual deficits she needed both hands supported for balance during gait.  She was able to demonstrate the ability to go up and down steps with min hand held assist and would be safe to return home at d/c with family's assist.  Husband to be with her at d/c.    Follow Up Recommendations  Home health PT;Supervision/Assistance - 24 hour     Equipment Recommendations  Rolling walker with 5" wheels    Recommendations for Other Services   NA     Precautions / Restrictions Precautions Precautions: Fall    Mobility  Bed Mobility Overal bed mobility: Modified Independent                Transfers Overall transfer level: Needs assistance   Transfers: Sit to/from Stand Sit to Stand: Min guard         General transfer comment: Min guard assist to support trunk during transitions for safety.  Pt relying on upper extremity support during transitions and clutching to quad cane initially upon standing.   Ambulation/Gait Ambulation/Gait assistance: Supervision Ambulation Distance (Feet): 150 Feet Assistive device: Rolling walker (2 wheeled) Gait Pattern/deviations: Step-through pattern;Shuffle;Decreased dorsiflexion - left;Decreased weight shift to left Gait  velocity: decreased Gait velocity interpretation: Below normal speed for age/gender General Gait Details: Pt attempted gait with SBQC and was unsuccessful needing more assistance with this device than with the RW.  Switched to RW before exiting her room.     Stairs Stairs: Yes Stairs assistance: Min assist Stair Management: Step to pattern;Forwards (with therapist's hand held assist. ) Number of Stairs: 5 General stair comments: Pt started going up reciprocally, educated that it was safer to go both up and down one step at a time leading up with strong and down with weak.  Pt does not have railings at home and therapist simulated her husband's hand held assist.       Modified Rankin (Stroke Patients Only) Modified Rankin (Stroke Patients Only) Pre-Morbid Rankin Score: Moderate disability Modified Rankin: Moderately severe disability     Balance Overall balance assessment: Needs assistance Sitting-balance support: Feet supported;No upper extremity supported Sitting balance-Leahy Scale: Good     Standing balance support: Bilateral upper extremity supported;Single extremity supported Standing balance-Leahy Scale: Poor Standing balance comment: needs external assist to maintain balance.                     Cognition Arousal/Alertness: Awake/alert Behavior During Therapy: WFL for tasks assessed/performed Overall Cognitive Status: Within Functional Limits for tasks assessed (not specifically tested)                             Pertinent Vitals/Pain See vitals flow sheet.  Home Living Family/patient expects to be discharged to:: Private residence Living Arrangements: Spouse/significant other;Children Available Help at Discharge: Available 24 hours/day Type of Home: House Home Access: Stairs to enter Entrance Stairs-Rails: None Home Layout: Two level;Able to live on main level with bedroom/bathroom Home Equipment: [place] - quad      Prior Function Level  of Independence: Needs assistance  Gait / Transfers Assistance Needed: assist from husband for stair negotiation ADL's / Homemaking Assistance Needed: patient does not perform cooking secondary to visual deficits     PT Goals (current goals can now be found in the care plan section) Acute Rehab PT Goals Patient Stated Goal: to go home Progress towards PT goals: Progressing toward goals    Frequency  Min 4X/week    PT Plan Current plan remains appropriate       End of Session Equipment Utilized During Treatment: Gait belt Activity Tolerance: Patient tolerated treatment well Patient left: in bed;with call bell/phone within reach;with bed alarm set;with family/visitor present (seated EOB)     Time: [id number] PT Time Calculation (min): 17 min  Charges:  $Gait Training: 8-22 mins                      [name], PT, DPT [phone]   03/19/2014, 4:40 PM

## 2014-04-09 ENCOUNTER — Emergency Department: Payer: Self-pay | Admitting: Emergency Medicine

## 2014-04-09 LAB — BASIC METABOLIC PANEL
Anion Gap: 9 (ref 7–16)
BUN: 10 mg/dL (ref 7–18)
CALCIUM: 8.7 mg/dL (ref 8.5–10.1)
CHLORIDE: 105 mmol/L (ref 98–107)
Co2: 24 mmol/L (ref 21–32)
Creatinine: 0.76 mg/dL (ref 0.60–1.30)
EGFR (African American): >60
EGFR (Non-African Amer.): >60
Glucose: 262 mg/dL — ABNORMAL HIGH (ref 65–99)
OSMOLALITY: 284 (ref 275–301)
POTASSIUM: 3.6 mmol/L (ref 3.5–5.1)
SODIUM: 138 mmol/L (ref 136–145)

## 2014-04-09 LAB — CBC
HCT: 36.8 % (ref 35.0–47.0)
HGB: 11.5 g/dL — ABNORMAL LOW (ref 12.0–16.0)
MCH: 20.2 pg — ABNORMAL LOW (ref 26.0–34.0)
MCHC: 31.3 g/dL — ABNORMAL LOW (ref 32.0–36.0)
MCV: 65 fL — ABNORMAL LOW (ref 80–100)
PLATELETS: 171 10*3/uL (ref 150–440)
RBC: 5.69 10*6/uL — ABNORMAL HIGH (ref 3.80–5.20)
RDW: 17.8 % — AB (ref 11.5–14.5)
WBC: 7.6 10*3/uL (ref 3.6–11.0)

## 2014-04-09 LAB — TROPONIN I: Troponin-I: <0.02 ng/mL

## 2014-04-14 ENCOUNTER — Emergency Department: Payer: Self-pay | Admitting: Emergency Medicine

## 2014-04-15 LAB — BASIC METABOLIC PANEL
Anion Gap: 9 (ref 7–16)
BUN: 12 mg/dL (ref 7–18)
CALCIUM: 8.5 mg/dL (ref 8.5–10.1)
CREATININE: 0.83 mg/dL (ref 0.60–1.30)
Chloride: 105 mmol/L (ref 98–107)
Co2: 26 mmol/L (ref 21–32)
EGFR (African American): >60
GLUCOSE: 256 mg/dL — AB (ref 65–99)
Osmolality: 288 (ref 275–301)
Potassium: 3.3 mmol/L — ABNORMAL LOW (ref 3.5–5.1)
Sodium: 140 mmol/L (ref 136–145)

## 2014-04-15 LAB — CBC
HCT: 34.1 % — ABNORMAL LOW (ref 35.0–47.0)
HGB: 10.4 g/dL — ABNORMAL LOW (ref 12.0–16.0)
MCH: 19.8 pg — ABNORMAL LOW (ref 26.0–34.0)
MCHC: 30.5 g/dL — ABNORMAL LOW (ref 32.0–36.0)
MCV: 65 fL — ABNORMAL LOW (ref 80–100)
Platelet: 169 10*3/uL (ref 150–440)
RBC: 5.24 10*6/uL — ABNORMAL HIGH (ref 3.80–5.20)
RDW: 17.8 % — ABNORMAL HIGH (ref 11.5–14.5)
WBC: 6.8 10*3/uL (ref 3.6–11.0)

## 2014-04-15 LAB — TROPONIN I

## 2014-06-05 ENCOUNTER — Emergency Department: Payer: Self-pay | Admitting: Emergency Medicine

## 2014-06-05 LAB — BASIC METABOLIC PANEL
Anion Gap: 5 — ABNORMAL LOW (ref 7–16)
BUN: 20 mg/dL — ABNORMAL HIGH (ref 7–18)
CO2: 23 mmol/L (ref 21–32)
CREATININE: 1.62 mg/dL — AB (ref 0.60–1.30)
Calcium, Total: 8.5 mg/dL (ref 8.5–10.1)
Chloride: 108 mmol/L — ABNORMAL HIGH (ref 98–107)
EGFR (African American): 43 — ABNORMAL LOW
EGFR (Non-African Amer.): 37 — ABNORMAL LOW
GLUCOSE: 303 mg/dL — AB (ref 65–99)
Osmolality: 286 (ref 275–301)
Potassium: 3.7 mmol/L (ref 3.5–5.1)
SODIUM: 136 mmol/L (ref 136–145)

## 2014-06-05 LAB — CBC WITH DIFFERENTIAL/PLATELET
BASOS ABS: 0.1 10*3/uL (ref 0.0–0.1)
Basophil %: 0.9 %
EOS ABS: 0.1 10*3/uL (ref 0.0–0.7)
Eosinophil %: 1.3 %
HCT: 36.1 % (ref 35.0–47.0)
HGB: 11.2 g/dL — ABNORMAL LOW (ref 12.0–16.0)
Lymphocyte #: 1.5 10*3/uL (ref 1.0–3.6)
Lymphocyte %: 18.6 %
MCH: 20.1 pg — ABNORMAL LOW (ref 26.0–34.0)
MCHC: 31.2 g/dL — ABNORMAL LOW (ref 32.0–36.0)
MCV: 65 fL — ABNORMAL LOW (ref 80–100)
MONO ABS: 0.9 x10 3/mm (ref 0.2–0.9)
MONOS PCT: 11.4 %
NEUTROS PCT: 67.8 %
Neutrophil #: 5.4 10*3/uL (ref 1.4–6.5)
Platelet: 168 10*3/uL (ref 150–440)
RBC: 5.59 10*6/uL — ABNORMAL HIGH (ref 3.80–5.20)
RDW: 18.1 % — ABNORMAL HIGH (ref 11.5–14.5)
WBC: 7.9 10*3/uL (ref 3.6–11.0)

## 2014-06-05 LAB — URINALYSIS, COMPLETE
BACTERIA: NONE SEEN
BILIRUBIN, UR: NEGATIVE
Blood: NEGATIVE
Glucose,UR: NEGATIVE mg/dL (ref 0–75)
Granular Cast: 9
Hyaline Cast: 1
KETONE: NEGATIVE
LEUKOCYTE ESTERASE: NEGATIVE
Nitrite: NEGATIVE
PH: 6 (ref 4.5–8.0)
Protein: 100
RBC,UR: 1 /HPF (ref 0–5)
Specific Gravity: 1.024 (ref 1.003–1.030)
Squamous Epithelial: 7
WBC UR: 7 /HPF (ref 0–5)

## 2014-06-05 LAB — TROPONIN I

## 2014-07-12 ENCOUNTER — Emergency Department: Payer: Self-pay | Admitting: Emergency Medicine

## 2014-07-12 LAB — CBC WITH DIFFERENTIAL/PLATELET
Basophil #: 0.1 10*3/uL (ref 0.0–0.1)
Basophil %: 1.2 %
EOS ABS: 0.1 10*3/uL (ref 0.0–0.7)
EOS PCT: 1.6 %
HCT: 35.8 % (ref 35.0–47.0)
HGB: 10.9 g/dL — ABNORMAL LOW (ref 12.0–16.0)
Lymphocyte #: 1.8 10*3/uL (ref 1.0–3.6)
Lymphocyte %: 26.7 %
MCH: 19.8 pg — AB (ref 26.0–34.0)
MCHC: 30.3 g/dL — ABNORMAL LOW (ref 32.0–36.0)
MCV: 65 fL — AB (ref 80–100)
MONO ABS: 0.8 x10 3/mm (ref 0.2–0.9)
MONOS PCT: 11.7 %
NEUTROS ABS: 4 10*3/uL (ref 1.4–6.5)
NEUTROS PCT: 58.8 %
Platelet: 179 10*3/uL (ref 150–440)
RBC: 5.5 10*6/uL — ABNORMAL HIGH (ref 3.80–5.20)
RDW: 18.4 % — AB (ref 11.5–14.5)
WBC: 6.8 10*3/uL (ref 3.6–11.0)

## 2014-07-12 LAB — BASIC METABOLIC PANEL
Anion Gap: 5 — ABNORMAL LOW (ref 7–16)
BUN: 9 mg/dL (ref 7–18)
CHLORIDE: 104 mmol/L (ref 98–107)
Calcium, Total: 8.3 mg/dL — ABNORMAL LOW (ref 8.5–10.1)
Co2: 30 mmol/L (ref 21–32)
Creatinine: 0.82 mg/dL (ref 0.60–1.30)
EGFR (African American): >60
EGFR (Non-African Amer.): >60
Glucose: 336 mg/dL — ABNORMAL HIGH (ref 65–99)
Osmolality: 289 (ref 275–301)
POTASSIUM: 3.6 mmol/L (ref 3.5–5.1)
Sodium: 139 mmol/L (ref 136–145)

## 2014-07-12 LAB — URINALYSIS, COMPLETE
Bilirubin,UR: NEGATIVE
Blood: NEGATIVE
Glucose,UR: >=500 mg/dL (ref 0–75)
Ketone: NEGATIVE
Leukocyte Esterase: NEGATIVE
Nitrite: NEGATIVE
Ph: 6 (ref 4.5–8.0)
Protein: NEGATIVE
RBC,UR: 1 /HPF (ref 0–5)
Specific Gravity: 1.031 (ref 1.003–1.030)
Squamous Epithelial: 8

## 2014-07-12 LAB — TROPONIN I: Troponin-I: <0.02 ng/mL

## 2014-07-13 LAB — TROPONIN I: Troponin-I: <0.02 ng/mL

## 2014-08-21 ENCOUNTER — Inpatient Hospital Stay: Payer: Self-pay | Admitting: Internal Medicine

## 2014-08-21 LAB — CBC
HCT: 37.9 % (ref 35.0–47.0)
HGB: 11.6 g/dL — ABNORMAL LOW (ref 12.0–16.0)
MCH: 20.2 pg — ABNORMAL LOW (ref 26.0–34.0)
MCHC: 30.5 g/dL — ABNORMAL LOW (ref 32.0–36.0)
MCV: 66 fL — AB (ref 80–100)
PLATELETS: 157 10*3/uL (ref 150–440)
RBC: 5.72 10*6/uL — ABNORMAL HIGH (ref 3.80–5.20)
RDW: 18.4 % — ABNORMAL HIGH (ref 11.5–14.5)
WBC: 7.1 10*3/uL (ref 3.6–11.0)

## 2014-08-21 LAB — COMPREHENSIVE METABOLIC PANEL
ALBUMIN: 3.3 g/dL — AB (ref 3.4–5.0)
ANION GAP: 8 (ref 7–16)
AST: 11 U/L — AB (ref 15–37)
Alkaline Phosphatase: 57 U/L
BILIRUBIN TOTAL: 0.7 mg/dL (ref 0.2–1.0)
BUN: 7 mg/dL (ref 7–18)
CHLORIDE: 101 mmol/L (ref 98–107)
CO2: 27 mmol/L (ref 21–32)
CREATININE: 0.82 mg/dL (ref 0.60–1.30)
Calcium, Total: 8.5 mg/dL (ref 8.5–10.1)
EGFR (African American): >60
EGFR (Non-African Amer.): >60
Glucose: 311 mg/dL — ABNORMAL HIGH (ref 65–99)
OSMOLALITY: 282 (ref 275–301)
Potassium: 3.5 mmol/L (ref 3.5–5.1)
SGPT (ALT): 18 U/L
Sodium: 136 mmol/L (ref 136–145)
TOTAL PROTEIN: 7.3 g/dL (ref 6.4–8.2)

## 2014-08-21 LAB — APTT: ACTIVATED PTT: 27.6 s (ref 23.6–35.9)

## 2014-08-21 LAB — URINALYSIS, COMPLETE
BACTERIA: NONE SEEN
Bilirubin,UR: NEGATIVE
LEUKOCYTE ESTERASE: NEGATIVE
Nitrite: NEGATIVE
Ph: 6 (ref 4.5–8.0)
Protein: NEGATIVE
RBC,UR: 1 /HPF (ref 0–5)
Specific Gravity: 1.032 (ref 1.003–1.030)
Squamous Epithelial: 4
WBC UR: 2 /HPF (ref 0–5)

## 2014-08-21 LAB — PROTIME-INR
INR: 1
PROTHROMBIN TIME: 12.8 s (ref 11.5–14.7)

## 2014-08-21 LAB — HEMOGLOBIN A1C: Hemoglobin A1C: 11.1 % — ABNORMAL HIGH (ref 4.2–6.3)

## 2014-08-21 LAB — TROPONIN I: Troponin-I: <0.02 ng/mL

## 2014-09-28 ENCOUNTER — Emergency Department: Payer: Self-pay | Admitting: Internal Medicine

## 2014-09-28 LAB — CBC WITH DIFFERENTIAL/PLATELET
BASOS PCT: 1.2 %
Basophil #: 0.1 10*3/uL (ref 0.0–0.1)
EOS ABS: 0.1 10*3/uL (ref 0.0–0.7)
EOS PCT: 1.4 %
HCT: 37.6 % (ref 35.0–47.0)
HGB: 11.6 g/dL — AB (ref 12.0–16.0)
LYMPHS PCT: 27.4 %
Lymphocyte #: 2.1 10*3/uL (ref 1.0–3.6)
MCH: 19.9 pg — AB (ref 26.0–34.0)
MCHC: 30.9 g/dL — ABNORMAL LOW (ref 32.0–36.0)
MCV: 64 fL — ABNORMAL LOW (ref 80–100)
Monocyte #: 0.8 x10 3/mm (ref 0.2–0.9)
Monocyte %: 10.5 %
NEUTROS ABS: 4.5 10*3/uL (ref 1.4–6.5)
Platelet: 140 10*3/uL — ABNORMAL LOW (ref 150–440)
RBC: 5.84 10*6/uL — AB (ref 3.80–5.20)
RDW: 17.3 % — ABNORMAL HIGH (ref 11.5–14.5)
WBC: 7.5 10*3/uL (ref 3.6–11.0)

## 2014-09-28 LAB — BASIC METABOLIC PANEL
Anion Gap: 10 (ref 7–16)
BUN: 11 mg/dL (ref 7–18)
CO2: 23 mmol/L (ref 21–32)
CREATININE: 0.84 mg/dL (ref 0.60–1.30)
Calcium, Total: 8.5 mg/dL (ref 8.5–10.1)
Chloride: 104 mmol/L (ref 98–107)
EGFR (African American): >60
GLUCOSE: 206 mg/dL — AB (ref 65–99)
Osmolality: 279 (ref 275–301)
Potassium: 3.8 mmol/L (ref 3.5–5.1)
SODIUM: 137 mmol/L (ref 136–145)

## 2014-09-28 LAB — TROPONIN I: Troponin-I: <0.02 ng/mL

## 2014-12-05 ENCOUNTER — Emergency Department: Payer: Self-pay | Admitting: Internal Medicine

## 2014-12-27 ENCOUNTER — Ambulatory Visit: Admit: 2014-12-27 | Disposition: A | Discharge status: Home / Self Care | Payer: Self-pay | Admitting: Neurology

## 2014-12-27 ENCOUNTER — Inpatient Hospital Stay
Admit: 2014-12-27 | Disposition: A | Discharge status: Home / Self Care | Payer: Self-pay | Attending: Internal Medicine | Admitting: Internal Medicine

## 2014-12-27 DIAGNOSIS — I639 Cerebral infarction, unspecified: Secondary | ICD-10-CM

## 2014-12-27 LAB — URINALYSIS, COMPLETE
BLOOD: NEGATIVE
Bilirubin,UR: NEGATIVE
Glucose,UR: >=500 mg/dL (ref 0–75)
KETONE: NEGATIVE
Leukocyte Esterase: NEGATIVE
Nitrite: NEGATIVE
PH: 7 (ref 4.5–8.0)
Protein: NEGATIVE
Specific Gravity: 1.035 (ref 1.003–1.030)

## 2014-12-27 LAB — COMPREHENSIVE METABOLIC PANEL
ANION GAP: 9 (ref 7–16)
Albumin: 4 g/dL
Alkaline Phosphatase: 49 U/L
BUN: 11 mg/dL
Bilirubin,Total: 1.2 mg/dL
CO2: 24 mmol/L
Calcium, Total: 8.9 mg/dL
Chloride: 102 mmol/L
Creatinine: 0.68 mg/dL
GLUCOSE: 284 mg/dL — AB
Potassium: 4.4 mmol/L
SGOT(AST): 19 U/L
SGPT (ALT): 11 U/L — ABNORMAL LOW
Sodium: 135 mmol/L
Total Protein: 7.6 g/dL

## 2014-12-27 LAB — CBC
HCT: 37.6 % (ref 35.0–47.0)
HGB: 11.7 g/dL — ABNORMAL LOW (ref 12.0–16.0)
MCH: 19.9 pg — ABNORMAL LOW (ref 26.0–34.0)
MCHC: 31.2 g/dL — AB (ref 32.0–36.0)
MCV: 64 fL — ABNORMAL LOW (ref 80–100)
Platelet: 138 10*3/uL — ABNORMAL LOW (ref 150–440)
RBC: 5.9 10*6/uL — ABNORMAL HIGH (ref 3.80–5.20)
RDW: 19.3 % — AB (ref 11.5–14.5)
WBC: 6.7 10*3/uL (ref 3.6–11.0)

## 2014-12-27 LAB — LIPID PANEL
CHOLESTEROL: 215 mg/dL — AB
HDL Cholesterol: 46 mg/dL
Ldl Cholesterol, Calc: 148 mg/dL — ABNORMAL HIGH
TRIGLYCERIDES: 103 mg/dL
VLDL CHOLESTEROL, CALC: 21 mg/dL

## 2014-12-27 LAB — PROTIME-INR
INR: 0.9
Prothrombin Time: 12.6 secs

## 2014-12-27 LAB — APTT: ACTIVATED PTT: 28.2 s (ref 23.6–35.9)

## 2014-12-28 LAB — BASIC METABOLIC PANEL
Anion Gap: 5 — ABNORMAL LOW (ref 7–16)
BUN: 15 mg/dL
CO2: 26 mmol/L
CREATININE: 0.77 mg/dL
Calcium, Total: 8.4 mg/dL — ABNORMAL LOW
Chloride: 103 mmol/L
EGFR (Non-African Amer.): >60
Glucose: 359 mg/dL — ABNORMAL HIGH
POTASSIUM: 4.2 mmol/L
Sodium: 134 mmol/L — ABNORMAL LOW

## 2014-12-28 LAB — CBC WITH DIFFERENTIAL/PLATELET
BASOS PCT: 0.7 %
Basophil #: 0.1 10*3/uL (ref 0.0–0.1)
Eosinophil #: 0.2 10*3/uL (ref 0.0–0.7)
Eosinophil %: 2.3 %
HCT: 33.5 % — ABNORMAL LOW (ref 35.0–47.0)
HGB: 10.6 g/dL — ABNORMAL LOW (ref 12.0–16.0)
LYMPHS ABS: 1.6 10*3/uL (ref 1.0–3.6)
Lymphocyte %: 21.3 %
MCH: 19.9 pg — ABNORMAL LOW (ref 26.0–34.0)
MCHC: 31.5 g/dL — ABNORMAL LOW (ref 32.0–36.0)
MCV: 63 fL — ABNORMAL LOW (ref 80–100)
MONOS PCT: 8.8 %
Monocyte #: 0.7 x10 3/mm (ref 0.2–0.9)
Neutrophil #: 5 10*3/uL (ref 1.4–6.5)
Neutrophil %: 66.9 %
PLATELETS: 148 10*3/uL — AB (ref 150–440)
RBC: 5.31 10*6/uL — AB (ref 3.80–5.20)
RDW: 19 % — ABNORMAL HIGH (ref 11.5–14.5)
WBC: 7.5 10*3/uL (ref 3.6–11.0)

## 2015-01-04 ENCOUNTER — Emergency Department
Admit: 2015-01-04 | Disposition: A | Discharge status: Home / Self Care | Payer: Self-pay | Admitting: Emergency Medicine

## 2015-01-04 NOTE — H&P (Signed)
PATIENT NAMECHERYLIN, [name] MR#:  [id number] DATE OF BIRTH:  [birth year]  DATE OF ADMISSION:  06/16/2012  PRIMARY CARE PHYSICIAN: Nonlocal REFERRING PHYSICIAN: Dr. Darnelle Catalan  CHIEF COMPLAINT: Nausea, dizziness today.   HISTORY OF PRESENT ILLNESS: 49 year old African American female with history of hypertension, diabetes presented to ED with nausea and dizziness today. She says she did not take lisinopril today. Blood pressure was noted to be high at 224/105. Patient was treated with Lopressor, clonidine, Valium and blood pressure decreased to about 180s. Patient denies any headache. No chest pain, palpitation, orthopnea, or nocturnal dyspnea but has generalized weakness.   PAST MEDICAL HISTORY:  1. Hypertension.  2. Diabetes.   SOCIAL HISTORY: No smoking, alcohol drinking or illicit drugs.   PAST SURGICAL HISTORY: C-section.   FAMILY HISTORY: Hypertension, diabetes.   MEDICATIONS:  1. Lisinopril 10 mg p.o. daily.  2. Metformin 500 mg p.o. t.i.d.   REVIEW OF SYSTEMS: CONSTITUTIONAL: Patient denies any fever, chills. No headache or dizziness. No weight loss. EYES: No double vision, blurred vision. ENT: No epistaxis, postnasal drip, slurred speech, or dysphagia. RESPIRATORY: No cough, sputum, shortness of breath, or hemoptysis. CARDIOVASCULAR: No chest pain, palpitation, orthopnea, or nocturnal dyspnea. RESPIRATORY: No cough, sputum, shortness of breath, or hemoptysis. GASTROINTESTINAL: Positive for nausea but no vomiting, diarrhea, no abdominal pain, melena, or bloody stool. GENITOURINARY: No dysuria, hematuria, or incontinence. HEMATOLOGY: No easy bruising, bleeding. NEUROLOGY: No syncope, loss of consciousness or seizure. SKIN: No rash or jaundice.   PHYSICAL EXAMINATION:  VITAL SIGNS: Temperature 97.5, blood pressure was 226/105, after treatment decreased to 196/92, pulse 103, oxygen saturation 95% on room air.   GENERAL: Patient is alert, awake, oriented in no acute distress.   HEENT:  Pupils round, equal, reactive to light, accommodation. Moist oral mucosa. Clear oropharynx.   NECK: Supple. No JVD or carotid bruits. No lymphadenopathy. No thyromegaly.   CARDIOVASCULAR: S1, S2 regular rate, rhythm. No murmurs, gallops.   PULMONARY: Bilateral air entry. No wheezing or rales. No use of accessory muscles to breathe.   ABDOMEN: Soft, obese. No distention or tenderness. No organomegaly. Bowel sounds present.   EXTREMITIES: No edema, clubbing, or cyanosis. No calf tenderness. Bilateral pulses present.   NEUROLOGY: Alert and oriented x3. No focal deficit. Power 5/5. Sensation intact.   LABORATORY, DIAGNOSTIC AND RADIOLOGICAL DATA: Urinalysis is negative. BNP 35. CAT scan of head no acute intracranial process. WBC 13.3, hemoglobin 12.1, platelets 131, glucose 326, BUN 10, creatinine 0.63, sodium 139, potassium 5.3, chloride 103, bicarbonate 24. Troponin less than 0.02. TSH 0.57. HCG less than 1. Chest x-ray: Low-grade congestive heart failure with mild interstitial edema.   IMPRESSION:  1. Hypertension malignancy.  2. Leukocytosis possibly due to reaction.  3. Hyperkalemia.  4. Diabetes.  5. Thrombocytopenia.  6. Morbid obesity.   PLAN OF TREATMENT:  1. Patient will be admitted to telemetry floor. Will start Lopressor and Lasix, give hydralazine IV p.r.n. for hypertension.  2. Since patient has hyperkalemia we will hold lisinopril and follow-up BMP. May need Kayexalate if potassium is still high.   3. For diabetes we will start sliding scale, hold metformin and give basal Lantus 10 units sub-Q at bedtime and follow up hemoglobin A1c and lipid panel.  4. Follow up CBC.  5. GI and deep vein thrombosis prophylaxis.   Discussed the patient's situation and plan of treatment with patient.   TIME SPENT: About 55 minutes.   ____________________________ Shaune Pollack, MD qc:cms D: 06/16/2012 21:54:59 ET T: 06/17/2012 06:31:40 ET  JOB#: [id number]  cc: Shaune PollackQing Adalai Perl, MD,  <Dictator> Shaune PollackQING Elsworth Ledin MD ELECTRONICALLY SIGNED 06/18/2012 15:15

## 2015-01-04 NOTE — Discharge Summary (Signed)
PATIENT NAMBeatrix [name]:  [name], [name] MR#:  [id number] DATE OF BIRTH:  1966-01-02  DATE OF ADMISSION:  06/16/2012 DATE OF DISCHARGE:  06/18/2012  ADMITTING PHYSICIAN: Dr. Imogene [name] DISCHARGING PHYSICIAN: Dr. Enid Baasadhika Garald Rhew   PRIMARY CARE PHYSICIAN: None.   CONSULTATIONS IN THE HOSPITAL: Endocrinology consultation by Dr. Tedd [name].    DISCHARGE DIAGNOSES:  1. Dizziness due to hypertensive urgency.  2. Hypertensive urgency.  3. Mild pulmonary edema due to elevated blood pressure.  4. Uncontrolled diabetes mellitus with hemoglobin A1c of 12.   5. Hyperlipidemia.   DISCHARGE MEDICATIONS:  1. Glyburide/metformin 2.5/100 mg, 2 tablets p.o. b.i.d.  2. Simvastatin 20 mg p.o. daily.  3. HCTZ/lisinopril 12.5/20 mg, 1 tablet p.o. daily.  4. Metoprolol 25 mg p.o. b.i.d. 5. Hydralazine 50 mg p.o. t.i.d.   DISCHARGE DIET: Low sodium, ADA 1800 diet.   DISCHARGE ACTIVITY: As tolerated.    FOLLOWUP INSTRUCTIONS:  1. [facility] Dr. Sampson [name]. The patient has the appointment set up to see Dr. Sampson [name] on 07/01/2012 at 9:15 a.m.  2. Follow up with Dr. Tedd [name] in two weeks. Appointment scheduled for 07/11/2012 at 9:15 a.m.  LABS AND IMAGING STUDIES:  WBC 13.3, hemoglobin 12.1, hematocrit 37.8, platelet count 131.   Sodium 140, potassium 3.2, chloride 104, bicarbonate 25, BUN 10, creatinine 0.79, glucose 232, calcium 8.5. TSH 0.363 but FT4 was within normal limits at 1.06. LDL 121, HDL 52, triglycerides 74, total cholesterol 188, hemoglobin A1c of 12. Urinalysis negative for any infection. CT of the head done for vertigo and dizziness complaints shows chronic small vessel ischemic disease. No acute intracranial process. Cardiac troponins have remained negative while in the hospital.   BRIEF HOSPITAL COURSE: Ms. [name] HaiLee is a 49 year old obese African American female with past medical history significant for uncontrolled diabetes and hypertension, who does not  have a primary care physician and has been visiting ERs for medication refills, and comes to the hospital secondary to dizziness and also nausea. Her blood pressure was extremely elevated at 224/105 on admission.  1. Dizziness secondary to hypertensive urgency: She was taking lisinopril 10 mg at home and has not recently checked her pressure at all. Her dizziness was probably secondary to elevated blood pressure and also her elevated sugars. Her blood pressure was better controlled by starting her on a regimen of hydralazine three times a day, metoprolol, and lisinopril/ HCTZ.  With her chronic hypertension she probably has some left ventricular hypertrophy. Echo was ordered, but the patient wants to go home and get that done as an outpatient, so a small dose of diuretic with HCTZ  was added to her medications. 2. Uncontrolled diabetes mellitus: Hemoglobin A1c of 12. She was only on low-dose metformin twice a day at home and also noncompliant with diet. She was seen by Dr. Tedd [name] in the hospital. The patient did not want to be started on insulin yet so she is being discharged on glyburide/metformin combination, 2 tablets twice a day, and will see Dr. Tedd [name] in two weeks.  The importance of dietary adherence, increased physical activity, and weight loss have been explained to the patient. She did have diabetic teaching in the hospital and was shown how to use her glucometer. 3. Hyperlipidemia: Started on statin prior to discharge.   Her course has been otherwise uneventful in the hospital.   DISCHARGE CONDITION: Stable.   DISCHARGE DISPOSITION: Home.       TIME SPENT ON DISCHARGE: 45 minutes.     ____________________________  [name] Baas, MD rk:bjt D: 06/18/2012 13:20:06 ET T: 06/18/2012 14:07:44 ET JOB#: [id number]  cc: [name] Baas, MD, <Dictator> Stann Mainland. [name] Goon, MD A. Wendall Mola, MD [name] Baas MD ELECTRONICALLY SIGNED 06/19/2012 14:59

## 2015-01-04 NOTE — Consult Note (Signed)
PATIENT NAMEJUELLE, [name] MR#:  [id number] DATE OF BIRTH:  03-19-1966  DATE OF CONSULTATION:  06/17/2012  REFERRING PHYSICIAN:  Gladstone Lighter, MD  CONSULTING PHYSICIAN:  A. Lavone Orn, MD  CHIEF COMPLAINT: Uncontrolled diabetes.   HISTORY OF PRESENT ILLNESS: This is a 49 year old female seen in consultation for uncontrolled diabetes. She was admitted yesterday with malignant hypertension. She presented to the ED complaining of nausea and dizziness and blood pressure was found to be 224/105. She has a 22-year history of diabetes. She has no known complications from diabetes. She does not have a primary care physician; she states she gets her medications at ED visits only. Her diabetes medication is just 500 mg of metformin t.i.d. She does not check her blood sugars. She had a glucometer in the past but has not used it in many years. She is aware that blood sugars are uncontrolled and expects them to typically be in the 200 to 300 range. She recalls in the past being prescribed both Avandia and glipizide. She only took Avandia for about a week. She did tolerate it. She recalls that glipizide may have caused some muscle aches and pains, details are not clear. She does have regular dilated eye exams. She denies any known history of retinopathy. She is obese and has not had any recent change in her weight. Appetite is fair. Nausea has resolved.   PAST MEDICAL HISTORY:  1. Hypertension.  2. Diabetes.  PAST SURGICAL HISTORY: Cesarean sections times five.   SOCIAL HISTORY: The patient is married. She has six children. She does not smoke or drink alcohol.    FAMILY HISTORY: Both parents had diabetes. Mother had heart disease. Father is deceased, she believes from pancreatic cancer. One brother had hypertension.   ALLERGIES: No known drug allergies.     REVIEW OF SYSTEMS:  HEENT: No blurred vision, no headache. NECK: No neck pain. No dysphasia. CARDIAC: No chest pain or palpitation. PULMONARY: No  cough. No shortness of breath. ABDOMEN: No abdominal pain. Appetite is good. Nausea has resolved. EXTREMITIES: Denies leg swelling. SKIN: Denies rash or pruritus. ENDO: Denies heat or cold intolerance. GENERAL: No fevers. No weight loss. HEME: Denies recent bleeding or easy bruisability.   PHYSICAL EXAMINATION:  VITAL SIGNS: Height 61.9 inches, weight 230 pounds, BMI 42.2. Temperature 98.3, pulse 84, respirations 20, blood pressure 170/112.   GENERAL: Morbidly obese African American female in no acute distress.   HEENT: Extraocular movements are intact.  Oropharynx is clear.  NECK: Supple.   LYMPH: No submandibular or anterior cervical adenopathy.   CARDIAC: Regular rate and rhythm without murmur.   PULMONARY: Clear to auscultation bilaterally.   ABDOMEN: Diffusely soft, nontender, nondistended.   SKIN: No rash. Acanthosis nigricans is present.   EXTREMITIES: No edema is present.   MUSCULOSKELETAL: Normal motor tone.   NEUROLOGIC: Cranial nerves intact.   PSYCHIATRIC: Alert and oriented times three.   LABORATORY: Blood sugar at 11:30 was 238, blood sugar at 7:39 a.m. was 250. Venous blood sugar at 4:30 a.m. was 232. Blood sugar last night at 10:00 p.m. was 308. Creatinine 0.79, potassium 3.8, eGFR greater than 60. Total cholesterol 188, triglycerides 74, LDL cholesterol 121, HDL cholesterol 52. Hemoglobin A1c 12%. AST 25, ALT 15. TSH 0.363 today and 0.57 yesterday, free T4 1.06. Additionally yesterday urinalysis shows glucose greater than 500 with 2+ ketones, venous bicarbonate was 24 and glucose was 326.  ASSESSMENT: 49 year old female with morbid obesity, uncontrolled hypertension, and uncontrolled type 2 diabetes.  RECOMMENDATIONS:  1. Blood sugars remain elevated on her current regimen of Levemir 10 units at bedtime and NovoLog sliding scale. I recommend changing to oral medicines. I suspect we will have better compliance and perhaps better long-term success. I will start  Glucovance 2.5/500, 2 tabs b.i.d. I recommend the first dose be given today at lunch.  2. Continue NovoLog sliding scale before meals and at bedtime.  3. She would benefit greatly from weight loss. Agree with diabetes education consultation. Perhaps they can provide her with information on a low glycemic diet. 4. Discussed the importance of self-monitoring of blood sugars. She should be given a glucometer before she leaves as well as prescription for test strips. I advised checking blood sugars at a minimum of twice daily, before breakfast and supper.  5. Agree with aggressive control of hypertension.  ACE inhibitors are recommended for renal protection, if tolerated.     Thank you for the kind request for consultation. I will follow along with you. I will arrange for outpatient followup as well.      ____________________________ A. Lavone Orn, MD ams:bjt D: 06/17/2012 13:51:32 ET T: 06/17/2012 15:27:44 ET JOB#: [id number]  cc: A. Lavone Orn, MD, <Dictator> Sherlon Handing MD ELECTRONICALLY SIGNED 07/01/2012 17:01

## 2015-01-07 NOTE — Consult Note (Signed)
PATIENT NAMBeatrix [name]:  [name], [name] MR#:  [id number] DATE OF BIRTH:  09-Apr-1966  DATE OF CONSULTATION:  02/12/2013  PRIMARY CARE PHYSICIAN:   Dr. Sampson GoonFitzgerald. CONSULTING PHYSICIAN:  Marcina MillardAlexander Wasif Simonich, MD  CHIEF COMPLAINT: Headache.   REASON FOR CONSULTATION: Consultation requested for evaluation of cardiac source for CVA.   HISTORY OF PRESENT ILLNESS: The patient is a 49 year old female with history of diabetes, extreme obesity and hypertension. She apparently was in her usual state of health until 02/11/2013, when she woke up with a right-sided 10 out of 10 chest pain. She presented to [facility] Emergency Room. Head CT and MRI were obtained, which revealed right temporal infarct, as well as right occipital infarct. The patient was evaluated by Dr. Loretha BrasilZeylikman, who recommended a hypercoagulable workup, as well as a transesophageal echocardiogram, since 2 different vascular distributions were involved.   PAST MEDICAL HISTORY: 1.  Diabetes.  2.  Hypertension.   MEDICATIONS ON ADMISSION: Simvastatin 20 mg daily, metoprolol 25 mg b.i.d., lisinopril/HCTZ 20/12.5 daily, hydralazine 50 mg t.i.d., Janumet 550, 1 tablet b.i.d., glyburide/metformin 2.5/500, 2 tablets b.i.d.   SOCIAL HISTORY: The patient denies tobacco abuse, is married and lives with her husband and daughter.   FAMILY HISTORY: No immediate family history for myocardial infarction or coronary artery disease.    REVIEW OF SYSTEMS:   CONSTITUTIONAL: No fever or chills.  EYES: The patient does have some blurry vision.  EARS:  No hearing loss.   RESPIRATORY: No shortness of breath.  CARDIOVASCULAR: No chest pain.  GASTROINTESTINAL: No nausea, vomiting or diarrhea.  GENITOURINARY: No dysuria or hematuria.  ENDOCRINE: The patient does have diabetes.  HEMATOLOGICAL: No easy bruising or bleeding.  INTEGUMENTARY: No rash.  MUSCULOSKELETAL: No arthralgias or myalgias.  NEUROLOGICAL: The patient has right-sided severe headache.   PHYSICAL  EXAMINATION: VITAL SIGNS: Blood pressure 150/85, pulse 85, respirations 18, (Dictation Anomaly)  HEENT: Pupils equal, reactive to light and accommodation.  NECK: Supple without thyromegaly.  LUNGS: Clear.  HEART: Normal JVP. Normal PMI. Regular rate and rhythm. Normal S1, S2. No appreciable gallop, murmur or rub.  ABDOMEN: Soft and nontender. Pulses were intact bilaterally.  MUSCULOSKELETAL: Normal muscle tone.  NEUROLOGIC: The patient is alert and oriented x 3. Motor and sensory both grossly intact.   IMPRESSION: A 49 year old female who presents with severe headache, found to have cerebrovascular accident in 2 vascular distributions. No evidence of arrhythmia or atrial fibrillation at this time.   RECOMMENDATIONS: 1.  Agree with overall current therapy.  2.  Review surface echocardiogram.  3.  The patient is scheduled for a transesophageal echocardiogram per neurology.    ____________________________ Marcina MillardAlexander Rayhana Slider, MD ap:dmm D: 02/12/2013 13:12:00 ET T: 02/12/2013 13:34:40 ET JOB#: [id number]  cc: Marcina MillardAlexander Vic Esco, MD, <Dictator> Marcina MillardALEXANDER Monroe Toure MD ELECTRONICALLY SIGNED 02/23/2013 8:48

## 2015-01-07 NOTE — Discharge Summary (Signed)
PATIENT NAMBeatrix [name]:  Hickox, Clorissa MR#:  [id number] DATE OF BIRTH:  11/12/1965  DATE OF ADMISSION:  08/05/2013 DATE OF DISCHARGE:  08/07/2013  HOSPITAL COURSE:  See dictated history and physical for details of admission.  This 49 year old woman reportedly had taken an overdose of Tylenol, although her Tylenol level never really went up all that high.  In the hospital she consistently denied any suicidal ideation.  She did not show any dangerous or aggressive behavior.  She appeared upbeat in her mood.  She cooperated with individual and group psychotherapy and medication management.  The patient was agreeable to follow-up treatment with a psychiatrist in the community.  Medically, her diabetes was a little bit out of control because we were not able to get her Victoza to her as quickly as she would have liked, but otherwise she was stable.  At the time of discharge, she had been counseled about the importance of engaging in outpatient therapy and working on ways to resolve her mood other than impulsive suicidal gestures.   DISCHARGE MEDICATIONS:  Trazodone 100 mg at night as needed for sleep, Paxil 20 mg once a day, metformin 1000 mg 2 times a day, rosuvastatin10 mg once a day at night, hydrochlorothiazide lisinopril combination 12.5/20 mg 1 tablet once a day, Victoza 1.2 mg subcutaneous per day.   LABORATORY RESULTS:  Blood sugars mostly remained in the mid-100s to low 200s for most of her hospital stay.  Urinalysis indicated likely urinary tract infection on admission.  Drug screen was negative.  Acetaminophen level never went above 45.  Potassium slightly low at 3.0.  Alcohol negative.  CBC showed a hemoglobin of 11.1, hematocrit normal at 35, low MCV.  PT normal.  EKG normal.   MENTAL STATUS EXAMINATION AT DISCHARGE:  Casually dressed, reasonably well-groomed woman, looks her stated age, cooperative with the interview.  Good eye contact.  Normal psychomotor activity.  Speech normal in rate, tone and volume.   Affect euthymic, reactive, appropriate.  Mood stated as good.  Thoughts are lucid without loosening of associations or delusions.  Denies auditory or visual hallucinations.  Denies suicidal or homicidal ideation.  Shows improved judgment and insight.  Normal intelligence.  Alert and oriented x 4.   DIAGNOSIS, PRINCIPAL AND PRIMARY:  AXIS I:  Major depression, moderate, recurrent.   SECONDARY DIAGNOSES: AXIS I:  Deferred.  AXIS II:  Borderline features.  AXIS III:  Diabetes, high blood pressure, dyslipidemia.  AXIS IV:  Severe from conflict at home chronically with her husband.  AXIS V:  Functioning at time of discharge 55.     ____________________________ Audery AmelJohn T. Rasheena Talmadge, MD jtc:ea D: 08/11/2013 22:50:22 ET T: 08/11/2013 23:17:47 ET JOB#: [id number]  cc: Audery AmelJohn T. Louvenia Golomb, MD, <Dictator> Audery AmelJOHN T Chakita Mcgraw MD ELECTRONICALLY SIGNED 08/11/2013 23:45

## 2015-01-07 NOTE — Consult Note (Signed)
Brief Consult Note: Diagnosis: CVA, 2 different vascular beds.   Patient was seen by consultant.   Consult note dictated.   Comments: REC  Agree with current therapy, review echo, TEE pending.  Electronic Signatures: Marcina MillardParaschos, Denorris Reust (MD)  (Signed 29-May-14 13:13)  Authored: Brief Consult Note   Last Updated: 29-May-14 13:13 by Marcina MillardParaschos, Viviana Trimble (MD)

## 2015-01-07 NOTE — H&P (Signed)
PATIENT NAMBeatrix Shipper:  [name] [name] MR#:  [id number] DATE OF BIRTH:  Feb 07, 1966  DATE OF ADMISSION:  08/04/2013  IDENTIFYING INFORMATION AND CHIEF COMPLAINT: A 49 year old woman who presented after taking an overdose on acetaminophen.   CHIEF COMPLAINT: "I feel like I am a burden."   HISTORY OF PRESENT ILLNESS: Information obtained from the patient and the chart. She reports that she has been feeling depressed for several months ever since she had a stroke in the spring. It has been getting worse recently. She has little activity to do and sits around the house feeling negative about herself. She feels like she is a burden on her family. She has not been able to work since she had the stroke and does not do much around the house either. Her mood stays down and depressed a lot of the time. She has been losing weight and has a poor appetite. Her thoughts tend to the negative. She has crying spells. She is vague about whether she ever has suicidal thoughts, but admits that she sometimes hears voices telling her that she is no good. She yesterday got into some kind of argument with her husband, claims that he called her stupid. She said she also had a bad headache. She started pouring Tylenol tablets out into her hand taking them. The initial intake said that she took 10 tablets of 500 mg each. The blood level she had makes this questionable. It is not as high as you would usually expect per 5000 mg, but that is what she said last night. Denies that she took any other drugs along with it. She says that she was trying to treat her headaches, but also admits that she felt like she does not want to be here anymore.   PAST PSYCHIATRIC HISTORY: Depressed since she had a stroke earlier this year. She does not seem to be getting any specific treatment for it. She says that in the past, her primary care doctor had prescribed Zoloft for her, but it caused headaches so she stopped taking it. Denies prior hospitalizations. Denies  prior suicide attempts. Denies any other psychiatric illnesses.   SOCIAL HISTORY: Married, has two daughters, ages 2822 and 7518 who live at home. The patient herself used to be employed full time, but has not worked since she had a stroke this spring. She says her eyesight was affected by the stroke such that she cannot make sense of things that she sees and has blurry vision all of the time. She has been told that she should not cook and should not drive and is not able to work.   PAST MEDICAL HISTORY: Had a stroke this last spring by her account.  She reports to me that the only lasting effect the stroke has had on her, has been damage to her vision. The specific way that her vision is damaged seems to be a little bit unclear.   She also has hyperlipidemia, diabetes, high blood pressure. Also had a diagnosis of hypothyroidism.   FAMILY HISTORY: Positive for depression.   SUBSTANCE ABUSE HISTORY: Says that she drank a bit in the past, but has not had a drink in years and does not use any other drugs.   REVIEW OF SYSTEMS:  Depressed mood, tearfulness, feels sad, down, feels like a burden to her family, feels negative about herself all the time. She is denying current acute suicidal ideation. She acknowledges occasional auditory hallucinations, but backs off from being very detailed about them. Denies any other  specific physical symptoms other than a headache lasting since yesterday.   MENTAL STATUS EXAM:  Somewhat less than well groomed woman, looks her stated age or older. Cooperative with the interview. Eye contact poor. Psychomotor activity sluggish. Speech decreased in total amount but easy to understand. Affect tearful and sad. Mood stated as depressed. Thoughts are generally lucid. No obvious delusions. No bizarre statements. Denies current hallucinations. Denies current suicidal or homicidal ideation. Judgment and insight questionable as she is still resisting hospitalization. Alert and oriented x  4. Normal intelligence.   PHYSICAL EXAMINATION: GENERAL: The patient is overweight.  SKIN: No skin lesions identified.  HEENT: Pupils equal and reactive. Face symmetric. Oral mucosa normal. I do not see any sign of any obvious abnormality to cranial nerves. Strength and reflexes appear to be symmetric throughout and she appears to have a normal gait. She denies any problems with sensation. She says she has a subjective feeling that things in her visual fields are strange-looking. She has nontender neck and back to palpation.  LUNGS: Clear to auscultation without wheezes.  HEART: Regular rate and rhythm.  ABDOMEN: Soft, nontender, normal bowel sounds.  VITAL SIGNS: Temperature 98.2, pulse 89, respirations 20, blood pressure currently 153/99.   LABORATORY RESULTS: Admission Tylenol level was 45. Strangely the follow-up one was also that. I am waiting on another one the morning. Drug screen all negative. Chemistry shows a glucose elevated at 215, potassium low at 3.1. Alcohol undetected. Hematology panel shows a hemoglobin low at 11.1. PT is 13.5. Urinalysis positive for glucose likely infected by a high number of white cells. Salicylates negative as I said, acetaminophen was 45 on two different draws at two different times.  It seems a remarkable coincidence. EKG normal sinus rhythm, possible old infarcts.   ASSESSMENT: A 49 year old woman who presents with a syndrome of major depression, possibly with psychotic features, status post suicide attempt, needs hospitalization. Multiple medical problems. Noncompliant with outpatient medical treatment.   TREATMENT PLAN: Admit to Psychiatry. Engage patient in groups and individual therapy. She was ordered paroxetine by somebody on admission and that seems as good an antidepressant to start with as any. We can continue that for now. Monitor mood and behavior. Get collateral information. Consider possibly adding an antipsychotic.   DIAGNOSIS, PRINCIPAL AND  PRIMARY:  AXIS I: Major depression severe, single episode.   SECONDARY DIAGNOSIS: AXIS I: No further.  AXIS II: Deferred.  AXIS III: Status post stroke, status post Tylenol overdose, hypertension, diabetes.  AXIS IV: Moderate to severe from lack of function at home.  AXIS V: Functioning at time of evaluation 30.     ____________________________ Audery Amel, MD jtc:dp D: 08/05/2013 15:00:57 ET T: 08/05/2013 15:53:11 ET JOB#: [id number]  cc: Audery Amel, MD, <Dictator> Audery Amel MD ELECTRONICALLY SIGNED 08/05/2013 19:22

## 2015-01-07 NOTE — Consult Note (Signed)
PATIENT NAMBeatrix [name]:  Azpeitia, Meily MR#:  [id number] DATE OF BIRTH:  October 29, 1965  DATE OF CONSULTATION:  02/12/2013  CONSULTING PHYSICIAN:  Pauletta BrownsYuriy Hristopher Missildine, MD  REASON FOR CONSULTATION:  Stroke.   HISTORY OF PRESENTING ILLNESS: This is a 49 year old African American female with past medical history of morbid obesity, diabetes, with elevated hemoglobin A1c 1 year ago of 12.3, on oral medications, hypertension, presenting to the Emergency Department with right-sided headache that started last night. The patient woke up with a headache that was 10 out of 10 in intensity, currently is 6 out of 10, pressure-like, dull in nature and constant. Currently denies any nausea, vomiting or slurred speech. Denies any motor deficits on upper and lower extremities. Denies any facial droop. Denies any sensory abnormalities. CAT scan and MRI of the head obtained. She has a right temporal infarct, as well as right occipital infarct. The patient currently denies any chest pain, any palpitations. Denies having any lower extremity swelling. The patient does have a history of strokes in her family, specifically her mom had a stroke in the 1150s, and she also has history of diabetes and hypertension.   PAST MEDICAL HISTORY:  Diabetes on oral medication, suspected to be poorly controlled. The last time she checked about 2 weeks ago, and her sugar was in the 200s. Hemoglobin A1c was 12.3 about a year ago. History of hypertension.   HOME MEDICATIONS:  Include simvastatin 20, metoprolol, Januvia, lisinopril, hydralazine, glyburide, metformin.   SOCIAL HISTORY:  The patient denies smoking. Denies alcohol or any drug use. She is married to her husband, and they have a daughter.   FAMILY HISTORY:  Strong family history of hypertension, diabetes. Mother has a history of stroke at age of 50.   REVIEW OF SYSTEMS:     CONSTITUTIONAL:  No fever, fatigue, weakness.  EYES: States she has blurred vision. No change in hearing or tinnitus.   RESPIRATORY: No cough.  CARDIOVASCULAR: No chest pain.  GASTROINTESTINAL: No nausea. No vomiting.  GENITOURINARY: No dysuria or hematuria.  ENDOCRINE: History of diabetes, poorly controlled in nature.  SKIN:  No rashes or lesions.  MUSCULOSKELETAL:  No joint swelling or pain.   PHYSICAL EXAMINATION: VITAL SIGNS:  Temperature 98.4, pulse 92, blood pressure 161/99, respiratory rate of 18, oxygen saturation 95% on room air.  HEENT:  Normocephalic, atraumatic.  NECK:  Supple.  CHEST:  No focal tenderness.  LUNGS:  Clear to auscultation.   HEART:  S1, S2, no murmurs.  NEUROLOGIC:  The patient is alert, awake, oriented to time, place, location and the reason why she is in the hospital. Able to tell me how many quarters are in a dollar. Speech appears to be fluent. On cranial nerve examination, extraocular movements appear to be intact. Visual fields: The patient has left visual field cut that is consistent with left homonymous hemianopsia consistent with her right occipital stroke. Pupils 4 mm to 2 mm, reactive bilaterally. Facial sensation and facial motor is intact. Tongue is midline. Uvula elevates symmetrically. Shoulder shrug intact bilaterally. Motor strength appears to be 5/5 bilateral upper and lower extremities. Sensation intact to light touch and proprioception.  Reflexes are 1+, symmetrical throughout. Coordination: Finger-to-nose intact. Gait not assessed.   LABORATORY FINDINGS:  The patient has a positive UA with 3+ leuk esterase.   IMAGING: CAT scan of the head, right temporal and right occipital stroke.  Same findings on MRI of the brain. The patient is status post carotid Doppler. No evidence of hemodynamic significant stenosis. 2-D  echocardiogram is pending.   ASSESSMENT:  A 49 year old African American female with history of morbid obesity, diabetes, hypertension, with both diabetes and hypertension poorly controlled, admitted with a right-sided headache, found to have right  temporal and right occipital infarcts, which are in 2 different vascular distributions. The patient only complaining of right temporal headache, but on examination, she is found to have left homonymous hemianopsia consistent with a right occipital infarct.   PLAN: The patient is to be started on aspirin 325. Continue her statin. Pending results of echocardiogram, hemoglobin A1c, lipid panel, homocystine, RPR. Would also perform a hypercoagulable panel with labs such as protein C, protein S deficiency, as well as Leiden factor V. Results can be followed up as an outpatient. If 2-D echocardiogram does not show any signs of thrombus, would also perform a TEE to make sure there is no thrombus there, because the stroke is in 2 different vascular distributions. PT and OT. The patient is currently n.p.o. for possibility of TEE today. Once this workup is complete, feel safe for the patient to be discharged in the next day or so. This case was discussed with Dr. Sampson Goon.   Thank you. It was a pleasure seeing this patient.   ____________________________ Pauletta Browns, MD yz:dmm D: 02/12/2013 11:45:36 ET T: 02/12/2013 12:11:41 ET JOB#: [id number]  cc: Pauletta Browns, MD, <Dictator> Pauletta Browns MD ELECTRONICALLY SIGNED 02/13/2013 14:41

## 2015-01-07 NOTE — H&P (Signed)
PATIENT NAMETANASHA, [name] MR#:  [id number] DATE OF BIRTH:  Jan 30, 1966  DATE OF ADMISSION:  02/11/2013  PRIMARY CARE PHYSICIAN:  Dr. Sampson Goon.  REFERRING PHYSICIAN:  Maurilio Lovely, PA.  CHIEF COMPLAINT:  Right-sided headache.   HISTORY OF PRESENT ILLNESS:  [name] is a 49 year old African American female with history of morbid obesity, diabetes mellitus, on oral medication, hypertension, presented to the Emergency Department with complaints of right temporal headache, started since last night.  The patient woke up with the headache on the right temporal, going behind the ear, 10 by 10 in intensity.  Denied having any nausea, vomiting, slurred speech.  Denies having any history of migraine headaches.  As the patient continued to have severe pain, came to the Emergency Department.  Work-up in the Emergency Department revealed the patient had moderate-sized acute infarct involving the right parietal temporal lobe.  Also, small focus of acute infarct in the right occipital lobe.  The patient also complains of some blurry vision.  This is associated with mild photophobia.  Denies having any fever.  Denies having weakness in any part of the body.  Denies having any chest pain, palpitations.  Denies having any lower extremity swelling.   PAST MEDICAL HISTORY: 1.  Diabetes mellitus, on oral medication.  2.  Hypertension.   ALLERGIES:  No known drug allergies.   HOME MEDICATIONS: 1.  Simvastatin 20 mg once a day.  2.  Metoprolol 25 mg 2 times a day.  3.  Janumet 550 mg 1 tablet 2 times a day.  4.  Lisinopril hydrochlorothiazide 20/12.5 mg 1 tablet once a day.  5.  Hydralazine 50 mg 3 times daily.  6.  Glyburide/metformin 2.5/500 mg 2 tablets 2 times a day.   SOCIAL HISTORY:  No history of smoking, drinking alcohol or using illicit drugs.  Married, lives with her husband and daughter.   FAMILY HISTORY:  Strong family history of hypertension and diabetes mellitus.   REVIEW OF  SYSTEMS: CONSTITUTIONAL:  No fever, fatigue, weakness. EYES:  Blurred vision.  EARS, NOSE, THROAT:  No change in hearing, tinnitus or sore throat.  RESPIRATORY:  No cough, shortness of breath.  CARDIOVASCULAR:  No chest pain or palpitations.  GASTROINTESTINAL:  No nausea, vomiting and diarrhea.  No abdominal pain.  GENITOURINARY:  No dysuria or hematuria.  ENDOCRINE:  History of diabetes mellitus, poorly-controlled.  SKIN:  No rash or lesions.  HEMATOLOGIC:  No easy bruising or bleeding.  MUSCULOSKELETAL:  No joint swelling or pain.   NEUROLOGIC:  The patient had depression.   PHYSICAL EXAMINATION: GENERAL:  This is a morbidly obese female lying down in the bed in mild distress secondary to pain and also a little somnolent.  VITAL SIGNS:  Temperature 98.4, pulse 92, blood pressure 161/99, respiratory rate of 18, oxygen saturations 95% on room air.  HEENT:  Head normocephalic, atraumatic.  Eyes, no sclerae icterus.  Conjunctivae normal.  Pupils equal and react to light.  Mucous membranes moist.  Extraocular movements are intact.  Mucous membranes moist.  No pharyngeal erythema.  Elevation of the uvula equally on both sides.  NECK:  Supple.  No lymphadenopathy.  No JVD.  No carotid bruit.  No thyromegaly.  CHEST:  Has no focal tenderness.  LUNGS:  Bilaterally clear to auscultation.  HEART:  S1 and S2 regular.  No murmurs are heard.  No pedal edema.  Pulses 2+ in dorsalis pedis and anterior tibialis, posterior tibialis.  ABDOMEN:  Bowel sounds plus.  Soft, nontender,  nondistended.  Could not appreciate hepatosplenomegaly secondary to patient's body habitus.  MUSCULOSKELETAL:  Good range of motion in all the extremities.  SKIN:  No rash or lesions.  LYMPHATIC:  No axillary or inguinal lymphadenopathy.  NEUROLOGIC:  The patient is alert, oriented to place, person and time.  Cranial nerves II through XII intact.  Motor 5 by 5 in upper and lower extremities.  No pronator drift.  Babinski downgoing  in both lower extremities.   LABORATORY DATA:  UA, 3+ leukocyte esterase, WBC of 8, bacteria 1+, has epithelial cells of 55.    CT head without contrast, findings concerning for moderate-sized acute infarct involving the right parietal and temporal lobes, recommended further evaluation.    CMP:  Potassium 5.4, glucose 266.  The rest of all the values are within normal limits.   CBC:  Hemoglobin 11.4.  The rest of the values are within normal limits.   MRI of the brain as mentioned above shows moderate-sized infarct involving the right parietal and temporal lobes and also small focus in the right occipital lobe.   ASSESSMENT AND PLAN:  1.  Cerebrovascular accident, multifocal, concerning about possible embolic.  Admit the patient to the monitored bed.  Continue the neuro checks q. 2 hours.  We will obtain echocardiogram and carotid Dopplers.  Keep the patient on aspirin 325 mg daily and a high-dose statin.  We will obtain lipid profile.  We will obtain a neurology consult.  2.  Diabetes mellitus, poorly-controlled.  The patient was admitted in 2013.  Hemoglobin A1c at that time was 12.4.  The patient is on 3 types of oral medication, Januvia, metformin and glipizide.  We will obtain hemoglobin A1c.  Hold the oral medication.  Keep the patient on sliding scale insulin. 3.  Hypertension, poorly-controlled at this time.  The patient is on multiple blood pressure medications.  We will continue with home regimen.  4.  Morbid obesity, will need further counseling.  5.  Urinary tract infection.  The patient has multiple epithelial cells.  We will repeat the urinalysis.  6.  Headache secondary to stroke.  We will continue symptomatic management.  7.  Keep the patient on deep vein thrombosis prophylaxis with Lovenox.    TIME SPENT:  50 minutes.    ____________________________ Susa GriffinsPadmaja Betania Dizon, MD pv:ea D: 02/12/2013 01:04:02 ET T: 02/12/2013 02:06:40 ET JOB#: [id number]  cc: Susa GriffinsPadmaja Kenji Mapel, MD,  <Dictator> Stann Mainlandavid P. Sampson GoonFitzgerald, MD Clerance LavPADMAJA Sedalia Greeson MD ELECTRONICALLY SIGNED 02/13/2013 7:32

## 2015-01-07 NOTE — Discharge Summary (Signed)
PATIENT NAMBeatrix [name]:  Masi, Christy MR#:  [id number] DATE OF BIRTH:  01/12/1966  DATE OF ADMISSION:  02/12/2013 DATE OF DISCHARGE:  02/13/2013   PRIMARY CARE PHYSICIAN: Dr. Sampson GoonFitzgerald, [facility].  CONSULTATIONS: Cardiology consult with Dr. Lady GaryFath, neurology consult with Dr. Loretha BrasilZeylikman.  DISCHARGE DIAGNOSES:  1.  Multifocal cerebrovascular accident.  2.  Hyperlipidemia.  3.  Poorly controlled diabetes mellitus.  4.  Hypertension.  5.  Hyperlipidemia.  6.  Morbid obesity.  7.  Hypothyroidism.    DISCHARGE MEDICATIONS: Janumet 500/50 one tablet p.o. b.i.d., HCTZ and lisinopril combination 12.5/20 mg 1 tablet p.o. daily, hydralazine 50 mg p.o. every 8 hours, sitagliptin 100 mg daily, atorvastatin 20 mg p.o. daily, metoprolol 25 mg p.o. b.i.d., aspirin 325 mg p.o. daily.   DIET: Low-sodium, low-fat, low-cholesterol, ADA diet.   LABORATORY DATA: Urinalysis on admission was cloudy with 1+ bacteria. Head CAT scan showed moderate-sized acute infarct of the right parietal and temporal lobe. Hemoglobin A1c is 13.3. Troponin 0.02. Electrolytes on admission: Sodium is 136, potassium 5.4, chloride 104, bicarbonate 25, BUN 11, creatinine 0.68, glucose 266. WBC on admission 9.3, hemoglobin 11.4, hematocrit 33.8, platelets 183. Ultrasound of carotids showed minimal plaque, no evidence of hemodynamically significant stenosis. MRI of the brain showed moderate-sized acute infarct in the right parietal and temporal lobes, and there is a small focus of acute infarct in the right occipital lobe. Echocardiogram shows EF of 60% to 65% with normal LV function, impaired LV relaxation. LDL is 143, triglycerides [id number]. RPR is nonreactive. Homocysteine levels are pending. The patient's urine toxicology is positive for opiates. Urine culture showed mixed bacteria. TEE is done, shows no evidence of cardiac source of emboli. Left atrial appendage with good Doppler flow. No smoke in the left atrium.  Agitated saline contrast injected with no evidence of right-to-left shunt. Mitral valve is normal. Aortic valve is normal. Tricuspid valve is normal. TEE essentially is normal.   ASSESSMENT AND PLAN: A 49 year old female patient with history of morbid obesity, hypertension, hyperlipidemia, came in because of severe headache, 10 out of 10 severity, pressure-like pain. The patient did not have any neurological deficit, but CAT scan on admission showed right temporal and also occipital stroke, so she was admitted for stroke evaluation. The patient had an echocardiogram/TEE, which did not show any signs of emboli or patent foramen ovale. The patient's carotid sonogram showed some plaque. LDL is high, and also hemoglobin A1c is 13.3. The patient's neurological exam showed normal visual fields and also normal motor strength with normal sensations. The patient was not needing any assistance here. Seen by Dr. Loretha BrasilZeylikman. The patient had an MRI of the brain and also carotid ultrasound and echocardiogram. MRI of the brain showed the results that I described here with  right-sided occipital, temporal, parietal lobe strokes. Because of multi-infarct strokes, the patient had a TEE which was negative.   The patient has morbid obesity, poorly controlled diabetes with hyperlipidemia, putting her at high risk for strokes. Believe she is not really compliant with medications. She follows up with Dr. Tedd SiasSolum as an outpatient. Told her to follow up with her in a week or so. Continue her diabetic medication.   For her stroke, she is on aspirin 325 mg daily and also started on Lipitor as well. The patient will get hypercoagulable work-up today and she can follow up with Dr. Malvin JohnsPotter or Dr. Sherryll BurgerShah as an outpatient.   The patient is strongly advised to follow up with endocrinology,  as she has very poorly controlled diabetes, and also she does have some hyperthyroidism with TSH of 0.314.   DISCHARGE VITAL SIGNS: Temperature is 98.6,  heart rate 81, blood pressure 126/82, sats 96% on room air.   CONDITION AT THE TIME OF DISCHARGE: Stable.   TIME SPENT: More than 30 minutes.    ____________________________ Katha Hamming, MD sk:jm D: 02/13/2013 11:19:05 ET T: 02/13/2013 13:52:04 ET JOB#: [id number]  cc: Katha Hamming, MD, <Dictator> Stann Mainland. Sampson Goon, MD A. Wendall Mola, MD Katha Hamming MD ELECTRONICALLY SIGNED 02/22/2013 13:24

## 2015-01-08 NOTE — Consult Note (Signed)
PATIENT NAMBeatrix [name]:  [name], [name] MR#:  [id number] DATE OF BIRTH:  01-28-1966  DATE OF CONSULTATION:  08/21/2014  CONSULTING PHYSICIAN:  Pauletta BrownsYuriy Savoy Som[place], MD  REASON FOR CONSULTATION: Suspected acute stroke.  HISTORY OF PRESENT ILLNESS: A 49 year old female with past medical history of stroke about a year ago with residual left upper extremity, left lower extremity weakness. Stopped antiplatelet therapy because she states if she ran out of her medications. At around 1:00 the patient was at the store and experienced new onset of left upper extremity and  left lower extremity paresthesias, left upper extremity hand grip weakness, as well as left lower extremity weakness with difficulty ambulating. Presented with hyperglycemia. Obtained CT head, no acute intracranial abnormality. Suspect new evolution of right subcortical stroke. The patient was not a TPA candidate first because as she states she does not want to receive it but, at the same time, her symptoms were improving. The strength in the left upper extremity significantly improved   PAST MEDICAL HISTORY: Stroke, noncompliant with medications, as well past medical history of diabetes.   REVIEW OF SYSTEMS: No fever. No chills. No abdominal pain. No chest pain. No fecal incontinence. No heat or cold intolerance. No anxiety. No depression.   RADIOLOGIC DATA: CAT scan reviewed as above, no acute intracranial abnormality.   PHYSICAL EXAMINATION:  VITAL SIGNS: Include a temperature of 98.1, pulse 96, respirations 18, blood pressure 179/98.  NEUROLOGIC: Extraocular movements are intact. Facial sensation intact. Facial motor is intact. Tongue is midline. No facial droop noted. Motor strength: Minimal left upper extremity drift, likely from chronic stroke. The patient's strength is 4/5 in the left hand grip and 5/5 in right hand grip, 3/5 left lower extremity, which is slightly weaker than from previous stroke. Sensation intact. The patient does state she has  paresthesias, but I could not see them on my examination. Coordination: Finger-to-nose intact. Gait could not be assessed.   IMPRESSION: A 49 year old female with past medical history of stroke a year ago admitted with worsening left-sided weakness that is improving. Not TPA candidate because symptoms significantly improved and when the patient was asked about TPA she category refused. The patient is noncompliant with her antihypertensive therapy, diabetic therapy, as well as antiplatelet therapy, states she ran out of her medications.   PLAN: Start antiplatelet therapy, start statin therapy, glycemic control. MRI of the brain, 2-D echo, carotid Doppler, physical therapy and occupational therapy, aspirin, lipid panel, statin. PT, OT, and discharge planning. This case was discussed with primary team as well as patient and her husband at bedside. Thank you it was a pleasure seeing this patient.    ____________________________ Pauletta BrownsYuriy Byan Poplaski, MD yz:bm D: 08/21/2014 18:42:55 ET T: 08/21/2014 23:55:56 ET JOB#: [id number]  cc: Pauletta BrownsYuriy Nesanel Aguila, MD, <Dictator> Pauletta BrownsYURIY Rockey Guarino MD ELECTRONICALLY SIGNED 09/15/2014 13:48

## 2015-01-08 NOTE — H&P (Signed)
PATIENT NAMESHERRONDA, [name] MR#:  [id number] DATE OF BIRTH:  [birth year]  DATE OF ADMISSION:  08/21/2014  PRIMARY CARE PHYSICIAN: Marlyn Corporal, MD   CHIEF COMPLAINT: Left-sided weakness and fall.   HISTORY OF PRESENT ILLNESS: This is a 49 year old female who presents to the Emergency Room as she developed some left upper extremity numbness and weakness. The patient says that she was outside with her husband shopping when she started to develop some left arm tingling and weakness. She went home and as per the family, she was not acting like herself as she was somewhat confused. Then, she tried to walk and she was unable to bear any weight on her left leg and fell to the floor. She was therefore brought to the ER for further evaluation. The patient has had a previous history of a stroke and was concerned that she may have had another one. She had a CT of the head done which showed no acute intracranial abnormality, but her symptoms still are consistent with a stroke and she continues to have some left lower extremity weakness, although her left upper extremity weakness has improved. Hospitalist services were contacted for further treatment and evaluation. The patient does admit to a mild headache, but no nausea, no vomiting, no chest pain, no shortness of breath and no other associated symptoms presently.   REVIEW OF SYSTEMS:  CONSTITUTIONAL: No documented fever. No weight gain. No weight loss.  EYES: No blurry or double vision.  ENT: No tinnitus. No postnasal drip. No redness of the oropharynx.  RESPIRATORY: No cough. No wheeze. No hemoptysis, no dyspnea.  CARDIOVASCULAR: No chest pain, no orthopnea, no palpitations, no syncope.  GASTROINTESTINAL: No nausea, no vomiting. No diarrhea. No abdominal pain. No melena or hematochezia.  GENITOURINARY: No dysuria or hematuria.  ENDOCRINE: No polyuria or nocturia. No heat or cold intolerance.  HEMATOLOGIC: No anemia. No bruising. No bleeding.   INTEGUMENTARY: No rashes or lesions.  MUSCULOSKELETAL: No arthritis, no swelling, no gout.  NEUROLOGIC: Positive numbness positive, positive tingling. No ataxia. No seizure activity. Positive left-sided weakness.  PSYCHIATRIC: No anxiety, no insomnia. No ADD. Positive history of depression.   PAST MEDICAL HISTORY: Consistent with history of previous CVA, hypertension, diabetes.   ALLERGIES: No known drug allergies.   SOCIAL HISTORY: Used to be a smoker, still smokes occasionally. No alcohol abuse. Did smoke marijuana in August of this year.   FAMILY HISTORY: Mother and father are both deceased. Father died from pancreatic cancer. Mother died from complications of diabetes.   CURRENT MEDICATIONS: The patient is supposed to be on these medications, but has not taken them in almost 3 to 5 weeks. These medications are: Glucotrol 5 mg daily, HCTZ/lisinopril 12.5/20 mg 1 tablet daily, metoprolol tartrate 25 mg 2 tabs b.i.d.   PHYSICAL EXAMINATION:  Presently is as follows:  VITAL SIGNS: Temperature 99.3, pulse 84, respirations 17, blood pressure 192/104, saturations 100% on room air.  GENERAL: She is a pleasant-appearing female in no apparent distress.  HEENT: Atraumatic, normocephalic. Extraocular muscles are intact. Pupils equal and reactive to light. Sclerae anicteric. No conjunctival injection. No pharyngeal erythema.  NECK: Supple. There is no jugular venous distention, no bruits, no lymphadenopathy or thyromegaly.  HEART: Regular rate and rhythm. No murmurs, no rubs, no clicks.  LUNGS: Clear to auscultation bilaterally. No rales or rhonchi. No wheezes.  ABDOMEN: Soft, flat, nontender, nondistended. Has good bowel sounds. No hepatosplenomegaly appreciated.  EXTREMITIES: No evidence of any cyanosis, clubbing, or peripheral edema. Has +  2 pedal and radial pulses bilaterally.  NEUROLOGIC: The patient is alert, awake, oriented x3. She has about 3 out of 5 strength in her left lower extremity  compared to her right. She also has about 3 out of 5 strength in her left upper extremity as compared to the right. Reflexes are +2 bilaterally. Babinski's are downgoing bilaterally. No other focal sensory deficits appreciated bilaterally.  SKIN: Moist and warm with no rashes.  LYMPHATIC: There is no cervical or axillary lymphadenopathy.   LABORATORY DATA: Serum glucose of 311, BUN 7, creatinine 0.8, sodium 136, potassium 3.5, chloride 101, bicarbonate 27. The patient's LFTs are within normal limits. Troponin less than 0.02. White cell count 7.1, hemoglobin 11.6, hematocrit 37.9, platelet count of 156,000. INR is 1. Urinalysis within normal limits.   The patient did have a chest x-ray done which showed no acute cardiopulmonary disease. The patient had a CT head done which showed an old right temporoparietal CVA but in no acute intracranial abnormality.   ASSESSMENT AND PLAN: This is a 49 year old female with a history of previous cerebrovascular accident, hypertension, diabetes, who presents to the hospital due to left-sided weakness and numbness and also a fall and suspected to have an ischemic cerebrovascular accident.  1. Suspected acute ischemic cerebrovascular accident. This is a likely diagnosis given the patient's acute neurologic symptoms. The patient was in the window of getting thrombolytics, but was undecided about it. Her neurologic symptoms have now improved. The patient has been seen by neurology. They did not recommend giving her thrombolytics, her clinical symptoms are actually improving, plus her systolic blood pressures are now over 190, so it is contraindicated. The patient apparently was noncompliant with aspirin; therefore, I will continue aspirin for now. Her initial CT head is negative. I will get a MRI brain, a carotid duplex and a 2-dimensional echocardiogram. Will get a neurology consult, also get a physical therapy consult. Will check a lipid profile.  2. Hypertension. The  patient is noncompliant with her medications. We will tolerate some element of hypertension given a suspected acute cerebrovascular accident. I will continue metoprolol, lisinopril/HCTZ, add some p.r.n. hydralazine.  3. Diabetes. Continue glipizide. Add some sliding scale insulin. Check hemoglobin A1c.   CODE STATUS: The patient is a full code.   TIME SPENT: 50 minutes.    ____________________________ Rolly PancakeVivek J. Cherlynn KaiserSainani, MD vjs:lm D: 08/21/2014 17:39:24 ET T: 08/21/2014 20:24:21 ET JOB#: [id number]  cc: Rolly PancakeVivek J. Cherlynn KaiserSainani, MD, <Dictator> Houston SirenVIVEK J SAINANI MD ELECTRONICALLY SIGNED 09/03/2014 10:43

## 2015-01-08 NOTE — Discharge Summary (Signed)
PATIENT NAMBeatrix Shipper:  [name], [name] MR#:  [id number] DATE OF BIRTH:  10-07-1965  DATE OF ADMISSION:  08/21/2014 DATE OF DISCHARGE:  08/22/2014  DISCHARGE DIAGNOSES:  1.  Acute lacunar infarct in the right caudate nucleus, along with a large chronic infarction of the right hemispheric middle cerebral artery.  2.  Uncontrolled diabetes mellitus.  3.  Uncontrolled hypertension.   CONSULTATIONS: Pauletta BrownsYuriy Zeylikman, [name], with neurology.   IMAGING STUDIES:  1.  CT scan of the head with contrast shows no acute intracranial pathology, other than an old right temporoparietal infarct with encephalomalacia.  2.  MRI of the brain showed acute right caudate nucleus, small lacunar infarct; also, a large remote right MCA territory infarct with encephalomalacia.  3.  Ultrasound carotid Dopplers showed no significant stenosis.  4.  Echocardiogram showed ejection fraction of 60% to 65%, with impaired relaxation pattern. No PFO or thrombus.   ADMITTING HISTORY AND PHYSICAL AND HOSPITAL COURSE: Please see detailed H and P dictated by Dr. Cherlynn KaiserSainani. In brief, a 49 year old [place] female patient with history of residual left-sided weakness from an old right-sided stroke, presented to the hospital with worsening symptoms. The patient was admitted for further work-up of CVA. The patient was seen by neurology. She has been on a telemetry floor along with neuro checks. An MRI showed an acute stroke. Initially, blood pressure medications were held for permissive hypertension. By the time of discharge, the patient's symptoms have completely resolved. She feels back to baseline. Was seen by PT; no rehab needs found at discharge. She is on an aspirin and statin. For her uncontrolled diabetes, her glipizide dose has been increased along with placing her on metformin. Blood pressure medications continued. The patient is being discharged home in a fair condition.   Prior to discharge, the patient's neurological examination shows  left-sided strength of 5-/5, right normal. Sensations intact. The patient has ambulated well in the hallway.   DISCHARGE MEDICATIONS:  1.  Aspirin 81 mg daily.  2.  Lovastatin 40 mg daily.  3.  Glucotrol XL 5 mg 2 tablets daily.  4.  Metoprolol tartrate 25 mg 2 tablets 2 times a day.  5.  Hydrochlorothiazide/lisinopril 12.5/20, 1 tablet orally once a day.   DISCHARGE INSTRUCTIONS: Low-sodium, low-fat, cholesterol controlled diet. Activity as tolerated. Follow up with primary care physician in 1-2 weeks.   TIME SPENT ON DAY OF DISCHARGE IN DISCHARGE ACTIVITIES: 42 minutes.    ____________________________ [name] BailiffSrikar R. Mehlani Blankenburg, [name] srs:MT D: 08/25/2014 15:35:30 ET T: 08/25/2014 19:38:57 ET JOB#: [id number]  cc: Wardell HeathSrikar R. Calum Cormier, [name], <Dictator> Orie FishermanSRIKAR R Bartlomiej Jenkinson [name] ELECTRONICALLY SIGNED 09/02/2014 13:06

## 2015-01-08 NOTE — Consult Note (Signed)
PATIENT NAMBeatrix [name]:  [name], [name] MR#:  [id number] DATE OF BIRTH:  1966/03/06  DATE OF CONSULTATION:  03/04/2014  REFERRING PHYSICIAN:  Daryel NovemberJonathan Williams, MD CONSULTING PHYSICIAN:  [name] FillersUzma S. Garnetta BuddyFaheem, MD  REASON FOR CONSULTATION:  "I don't want to be here no more."  HISTORY OF PRESENT ILLNESS: The patient is a 49 year old married African American female who presented to the ED by the EMS, as she reported that she has taken some extra pills of the pain medications. She reported that it was just a mistake as she was not trying to hurt herself. She reported that she currently lives with her husband who works in Production designer, theatre/television/filmmaintenance in [place].  She reported that she was feeling overwhelmed and she called the crisis line and the EMS got there. She reported that she told them that she took 4 pills of oxycodone, but it was not a suicide attempt, as only she was having headaches. She reported that the last time she was having a headache, it was actually a stroke and she does not want to have a stroke again this time. She reported that she only took 4 pills of oxycodone and her husband also reports his pain medication is in the same bottle, so she took 1 of his tramadol. The patient reported that the crisis line called the ambulance and she was taken to the hospital. The patient reported that now she is feeling angry and upset because she is in the hospital, as well as feeling hungry because the food was not good here. She wants to go home as she currently lives with her husband, as well as her sister and her daughter. Reported that she has not been working for the past year due to the stroke. The patient currently denied having any relationship issues with her husband. She reported that he takes care of her and she has been compliant with her medications. She currently denied having any perceptual disturbances. She appeared calm and collected during the interview.   PAST PSYCHIATRIC HISTORY: The patient reported that she does not see a  psychiatrist on a regular basis. She reported that she is only prescribed medications for her medical conditions.   PAST MEDICAL HISTORY: Diabetes, hypertension.  CURRENT MEDICATIONS: Lisinopril 20 mg p.o. daily, metformin 500 mg b.i.d., glipizide 5 mg once a day, Victoza 18 mg subcutaneous solution.   ALLERGIES: No known drug allergies.   PAST MEDICAL HISTORY: CVA, non-insulin-dependent diabetes, hypertension, C-section.   SOCIAL HISTORY: The patient currently lives with her husband who works in [place] as maintenance. She reported that she was following with Dr. Lucianne MussLima but then she was unable to afford the co-pay and was prescribed Paxil and trazodone in the past. Now she is not seeing any psychiatrist as an outpatient. The patient reported that she stays at home. She denied having any thoughts to hurt herself.  REVIEW OF SYSTEMS:  CONSTITUTIONAL: Denies any fever or chills. No weight changes.  EYES: No double or blurred vision.  RESPIRATORY: No shortness of breath or cough.  CARDIOVASCULAR: Denies any chest pain or orthopnea.  GASTROINTESTINAL: No abdominal pain, nausea, vomiting or diarrhea.  GENITOURINARY: No incontinence or frequency.  ENDOCRINE: No heat or cold intolerance.  LYMPHATIC: No anemia or easy bruising.  INTEGUMENTARY: No acne or rash.  MUSCULOSKELETAL: No muscle or joint pain.  NEUROLOGIC: No tingling or weakness.   VITAL SIGNS: Temperature 98, pulse 85, respirations 20, blood pressure 136/7.  LABORATORY DATA:  Glucose 224, BUN 11, creatinine 0.78. Sodium 136, potassium 3.5,  chloride 104, bicarbonate 25, anion gap 7, osmolality 278. Blood alcohol level less than 3. UDS is negative. WBC 7.8, RBC 5.42, hemoglobin 10.9, MCV 65, RDW is 18.3.   MENTAL STATUS EXAMINATION: The patient is a moderately built female who appeared her stated age. She appears well developed. Her muscle tone is normal. Gait and station appears within normal limits. Speech was normal in tone and volume.  Thought process was logical, goal-directed. Thought content was nondelusional. No loose associations are noted. Her insight and judgment regarding her situation are normal. She was awake, alert and oriented x 3.Her recent and remote memory were intact. She denied having any thoughts to hurt herself. Her attention span and concentration were normal. Fund of knowledge and awareness of current events were fine. Mood was fine and affect was congruent.   DIAGNOSTIC IMPRESSION: AXIS I:   Mood disorder, not otherwise specified.  AXIS II:  None reported.  AXIS III: Please review the medical history.   TREATMENT PLAN:  I discussed with the patient at length about the medications, treatment, risks, benefits and alternatives. She does not need any prescription for psychotropic medication at this time. She will be discharged in stable condition in the ER and she will be referred to [name]. I advised the patient that if she noticed worsening of her condition, she should come back to the ER and she agreed with the plan. I will release her from the involuntary commitment at this time.  Thank you for allowing me to participate in the care of this patient.    ____________________________ [name]. Garnetta Buddy, MD usf:ce D: 03/04/2014 16:36:38 ET T: 03/04/2014 17:42:15 ET JOB#: [id number]  cc: [name]. Garnetta Buddy, MD, <Dictator> Rhunette Croft MD ELECTRONICALLY SIGNED 03/09/2014 9:28

## 2015-01-16 NOTE — Discharge Summary (Signed)
PATIENT NAMBeatrix [name]:  [name], [name] MR#:  [id number] DATE OF BIRTH:  08/22/1966  DATE OF ADMISSION:  12/27/2014 DATE OF DISCHARGE:  12/28/2014  DISCHARGE DIAGNOSES:  1. 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  2. Uncontrolled diabetes mellitus.  3. Hypertension.  4. Hyperlipidemia.  5. Morbid obesity.   IMAGING STUDIES: Include: 1. A CT scan of the head, which showed nothing acute.  2. MRI of the brain showed acute CVA of the right dorsal midbrain and encephalomalacia from prior stroke.  3. Echocardiogram showed normal ejection fraction with no source of a [name] or CVA.  4. Carotid Doppler showed nothing acute.   CONSULTS: Ruben ReasonYurly Zeylikman, MD with neurology.   ADMITTING HISTORY AND PHYSICAL AND HOSPITAL COURSE: Please see detailed H and P dictated previously. In brief, a 49 year old African American female patient presented to the hospital complaining of vision changes. She had worsening bilateral vision for the past few months, which acutely worsened and brought her into the Emergency Room. The patient was admitted onto the telemetry floor after consulting neurology. An MRI of the brain was done, which showed a right midbrain acute stroke. The patient is on aspirin and a statin. Echo and carotid Doppler showed nothing acute. Her new stroke is thought to be likely from uncontrolled diabetes. The patient is on Victoza and metformin. I have ordered glipizide at discharge and she will follow up with her primary care physician for further titrating of medications,  for good control of diabetes.   The patient prior to discharge, S1, S2 heard, lungs sound clear, and neurological examination shows left-sided weakness from her prior stroke.    DISCHARGE MEDICATIONS:  1. Aspirin 325 mg daily.  2. Hydrochlorothiazide lisinopril 12.5/20 mg oral once a day.  3. Victoza 1.8 mg subcutaneously once a day.  4. Metoprolol tartrate 25 mg oral 2 times a day.  5. Metformin 1000 mg oral 2 times a  day.  6. Glipizide 5 mg oral 2 times a day.  7. Crestor 10 mg daily.   DISCHARGE INSTRUCTIONS:  Home health with PT, OT, and nursing has been set up. Low sodium, low-fat, low-cholesterol, carbohydrate controlled diet. Activity as tolerated with a walker. Follow up with neurology, Dr. Sherryll BurgerShah or Dr. Malvin JohnsPotter in 2 to 4 weeks and primary care physician in 1 week.   Time Spent on day of discharge in discharge activity was 35 minutes.   ____________________________ Molinda BailiffSrikar R. Kaelem Brach, MD [id number] D: 12/29/2014 14:58:42 ET T: 12/29/2014 15:54:54 ET JOB#: [id number]  cc: Wardell HeathSrikar R. Elpidio AnisSudini, MD, <Dictator> Marlyn CorporalFayegh H. Jadali, MD  Orie FishermanSRIKAR R Xaniyah Buchholz MD ELECTRONICALLY SIGNED 01/10/2015 11:18

## 2015-01-16 NOTE — Consult Note (Signed)
PATIENT NAMBeatrix [name]:  [name], [name] MR#:  [id number] DATE OF BIRTH:  08/06/1966  DATE OF CONSULTATION:  12/27/2014  REFERRING PHYSICIAN:   CONSULTING PHYSICIAN:  [name] BrownsYuriy Alys Dulak, MD  HISTORY OF PRESENT ILLNESS:  A 49 year old African American female with past medical history significant for diabetes, hypertension, prior history of stroke with residual left-sided weakness presents with worsening mental status. 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  Because of worsening visual acuity, the patient presented to the hospital. Denies any speech changes. Denies any new lesions.   PAST MEDICAL HISTORY: Stroke 2014 with residual left-sided weakness, hypertension, noninsulin diabetes, hyperlipidemia.   PAST SURGICAL HISTORY: She had C-section.   HOME MEDICATIONS: Have been reviewed.   REVIEW OF SYSTEMS: No chest pain. No shortness of breath. No new weakness on 1 side of the body compared to the other, chronic left-sided weakness. No anxiety. No depression.   NEUROLOGIC: The patient is alert, awake, oriented to time, place. Speech appears to be fluent. The patient has a right third nerve palsy. As per the patient, that might not be new, but she says the symptoms come and go. Facial sensation intact. Facial motor is intact. Motor strength: Drift in the left upper and left lower extremity, which is chronic. No new weakness on the right side. Coordination: Finger-to-nose intact. Reflexes diminished bilaterally.   IMPRESSION: A 49 year old [place] female with past medical history of hypertension, diabetes, hyperlipidemia, presenting with worsening diplopia. The patient has a right third nerve palsy. I suspect the right third nerve palsy is in the setting of chronic diabetes.  I do not suspect there are any acute new neurological findings at this point.   PLAN: Agree with obtaining MRI to make sure there is no  acute ischemia, otherwise tight blood sugar control, physical therapy, occupational therapy, and discharge planning.   Thank you for asking us to see this patient.  Please call with any questions.      ____________________________ [name] BrownsYuriy Damario Gillie, MD yz:DT D: 12/27/2014 15:16:53 ET T: 12/27/2014 15:43:09 ET JOB#: [id number]  cc: [name] BrownsYuriy Sequita Wise, MD, <Dictator> [name] BrownsYURIY Nichoel Digiulio MD ELECTRONICALLY SIGNED 01/11/2015 21:27

## 2015-01-16 NOTE — H&P (Signed)
PATIENT NAMEKAILIA, [name] MR#:  [id number] DATE OF BIRTH:  September 08, 1966  DATE OF ADMISSION:  12/27/2014  ADMITTING PHYSICIAN: Enid Baas, M.D.   PRIMARY MEDICAL DOCTOR: Dr. Dario Guardian.  CHIEF COMPLAINT:  Vision changes.  HISTORY OF PRESENT ILLNESS: [name] is a 49 year old African American female with past medical history significant for hypertension, non-insulin dependent diabetes mellitus, hyperlipidemia, and prior history of stroke with residual left-sided weakness.  Presents to the hospital secondary to sudden onset of vision changes that started this morning. The patient states that she has been having poor visual acuity in both eyes a few weeks and she was sent to see an ophthalmologist in [place] and she was declared to have 20 x 400 vision in the right eye and she was told she was legally blind in that eye.  Because of that, her visual acuity was low, but this morning around 5:00 she woke up to go use the bathroom and suddenly when she opened her eye she could not focus at all.  Her vision was completely distorted which is very acute for the patient. She said she had to call her husband to get her to the bathroom because she could not see anything and did not focus. Her husband realized that her right eye was laterally deviated which is new to the patient, so she was brought to the hospital. She denies any speech changes, swallowing changes. No facial droop. No tingling or numbness. She has been having trouble lifting her left leg.  That has been going on for a few days.  She states she is weaker on the left side, but has not used a cane or walker, but she noticed that she has been having to drag her left leg and it has been consistently getting heavier.  No other neurological symptoms. No fevers, chills, no other symptoms.   PAST MEDICAL HISTORY:  1.  CVA in 2014 with residual left-sided weakness.  2.  Hypertension.  3.  Non-insulin dependent diabetes mellitus.  4.  Hyperlipidemia.    PAST SURGICAL HISTORY: C-section.   ALLERGIES: No known drug allergies.  CURRENT HOME MEDICATIONS: 1.  Victoza 1.8 mg subcutaneous daily.  2.  Aspirin 325 mg p.o. daily.  3.  Metformin 1000 mg p.o. b.i.d.  4.  Metoprolol 25 mg p.o. b.i.d.  5.  Hydrochlorothiazide and lisinopril 12.5/20 mg 1 tablet p.o. daily.   SOCIAL HISTORY: Lives at home with her husband.  Does not use a cane or walker, pretty independent even though has mild residual left-sided weakness. Denies any smoking, occasional alcohol use.   FAMILY HISTORY: Significant for CVA and diabetes in mother.  Dad with pancreatic cancer and diabetes and aunt with cervical cancer.   REVIEW OF SYSTEMS: CONSTITUTIONAL: No fever, fatigue, or weakness.  EYES: Positive for blurred vision, decreased visual acuity.  No inflammation or glaucoma.  EAR, NOSE, AND THROAT: No tinnitus, ear pain, hearing loss, epistaxis, or discharge.  RESPIRATORY: No cough, wheeze, hemoptysis, or COPD.  CARDIOVASCULAR: No chest pain, orthopnea, edema, arrhythmia, palpitations, or syncope.  GASTROINTESTINAL: No nausea, vomiting, diarrhea, abdominal pain, hematemesis, or melena.  GENITOURINARY: No dysuria, hematuria, renal calculus, frequency, or incontinence.  ENDOCRINE: No polyuria, nocturia, or thyroid problems.  No heat or cold intolerance. HEMATOLOGY: No anemia, easy bruising, or bleeding.  SKIN: No acne, rash, or lesions.  MUSCULOSKELETAL: No neck fracture, pain, arthritis, or gout.  NEUROLOGIC: Positive history of CVA, and now visual changes. No tremor, dementia, or headaches.  PSYCHOLOGICAL: No anxiety, insomnia,  or depression.   PHYSICAL EXAMINATION:  VITAL SIGNS: Temperature 98.4 degrees Fahrenheit, pulse 92, respirations 18, blood pressure 158/97, pulse oximetry 97% on room air.  GENERAL EXAMINATION: Heavily-built, well-nourished female lying in bed, not in any acute distress.   HEENT: Normocephalic, atraumatic. No facial droop noted. Patient  cannot see the light in her right eye.  Pupils are reacting to light. Her right eye is laterally deviated. Left eye:  Minimal light can be perceived. Extraocular movements are normal on the left side.  OROPHARYNX: Clear without erythema, mass, or exudates.  NECK: Supple. No thyromegaly, JVD, or carotid bruits. No lymphadenopathy.  LUNGS: Moving air bilaterally. No wheeze or crackles. No use of accessory muscles for breathing.  CARDIOVASCULAR: S1, S2.  Regular rate and rhythm. No murmurs, rubs, or gallops.  ABDOMEN: Obese, soft, nontender, nondistended. No hepatosplenomegaly. Normal bowel sounds.  EXTREMITIES: No pedal edema. No clubbing or cyanosis, 2+ dorsalis pedis pulses palpable bilaterally.  SKIN: No acne, rash, or lesions.  LYMPHATICS: No cervical lymphadenopathy.  NEUROLOGIC: Other than the lateral deviation of the right eye, no other cranial nerve deficits noted. Strength is normal on the right side and sensation intact.  On the left side, sensation is intact. Left arm strength is 5/5.  Left leg strength is 4/5 with normal deep tendon reflexes noted.  PSYCHOLOGIC: The patient is awake, alert, oriented x 3.   LABORATORY DATA:  1.  WBC 6.7, hemoglobin 11.7, hematocrit 37.6, platelet count 138,000.  MCV 64.  2.  INR 0.9, PTT 28.2. 3.  Sodium 135, potassium 4.4, chloride 102, bicarbonate is 24, BUN 11, creatinine 0.68, glucose of 284, and calcium of 8.9.  4.  ALT 11, AST 19, alkaline phosphatase 49, total bilirubin 1.2, albumin of 4.0.  5.  CT of the head without contrast showing no acute intracranial findings. White matter microvascular disease, remote infarction in the right temporoparietal lobe noted. Chest x-ray showing no acute cardiopulmonary disease. EKG is normal sinus rhythm. No acute ST-T wave abnormalities. Heart rate of 86.   ASSESSMENT AND PLAN: A 49 year old female with cerebrovascular accident with prior cerebrovascular accident, left leg weakness, hypertension, diabetes,  hyperlipidemia, comes with visual changes acutely started, worsening left leg weakness. 1.  Acute visual changes, known decreased vision in both eyes secondary to retinopathy recently seen by ophthalmology; however, lateral eye deviation and worsening of the vision without the ability to focus.  Could be from the lateral deviation. Will get MRI brain, neurologic checks, neurologic consult, ophthalmology consult for retinopathy evaluation, carotid Dopplers, echocardiogram.  Continue aspirin and statin for now. Check lipid profile.  2.  Hypertension on home medications.  Will be continued.  3.  Diabetes.  Sliding scale insulin and home medications.  4.  Deep vein thrombosis prophylaxis.   CODE STATUS: Full code.   Time spent on admission is 50 minutes.   ____________________________ Enid Baasadhika Lovena Kluck, MD rk:sp D: 12/27/2014 12:52:26 ET T: 12/27/2014 13:15:46 ET JOB#: [id number]  cc: Enid Baasadhika Chioma Mukherjee, MD, <Dictator> Unknown cc Enid BaasADHIKA Choya Tornow MD ELECTRONICALLY SIGNED 01/07/2015 14:49

## 2015-04-19 ENCOUNTER — Emergency Department
Admission: EM | Admit: 2015-04-19 | Discharge: 2015-04-19 | Disposition: A | Discharge status: Home / Self Care | Payer: BLUE CROSS/BLUE SHIELD | Attending: Emergency Medicine | Admitting: Emergency Medicine

## 2015-04-19 ENCOUNTER — Other Ambulatory Visit: Payer: Self-pay

## 2015-04-19 DIAGNOSIS — Z79899 Other long term (current) drug therapy: Secondary | ICD-10-CM | POA: Diagnosis not present

## 2015-04-19 DIAGNOSIS — E1165 Type 2 diabetes mellitus with hyperglycemia: Secondary | ICD-10-CM | POA: Diagnosis not present

## 2015-04-19 DIAGNOSIS — R739 Hyperglycemia, unspecified: Secondary | ICD-10-CM

## 2015-04-19 DIAGNOSIS — Z7982 Long term (current) use of aspirin: Secondary | ICD-10-CM | POA: Insufficient documentation

## 2015-04-19 DIAGNOSIS — I10 Essential (primary) hypertension: Secondary | ICD-10-CM | POA: Insufficient documentation

## 2015-04-19 DIAGNOSIS — R42 Dizziness and giddiness: Secondary | ICD-10-CM | POA: Diagnosis present

## 2015-04-19 HISTORY — DX: Cerebral infarction, unspecified: I63.9

## 2015-04-19 LAB — URINALYSIS COMPLETE WITH MICROSCOPIC (ARMC ONLY)
Bacteria, UA: NONE SEEN
Bilirubin Urine: NEGATIVE
Glucose, UA: >500 mg/dL — AB
Hgb urine dipstick: NEGATIVE
KETONES UR: NEGATIVE mg/dL
Leukocytes, UA: NEGATIVE
Nitrite: NEGATIVE
PROTEIN: NEGATIVE mg/dL
Specific Gravity, Urine: 1.036 — ABNORMAL HIGH (ref 1.005–1.030)
pH: 6 (ref 5.0–8.0)

## 2015-04-19 LAB — BASIC METABOLIC PANEL
ANION GAP: 11 (ref 5–15)
BUN: 14 mg/dL (ref 6–20)
CO2: 26 mmol/L (ref 22–32)
CREATININE: 0.79 mg/dL (ref 0.44–1.00)
Calcium: 9.1 mg/dL (ref 8.9–10.3)
Chloride: 98 mmol/L — ABNORMAL LOW (ref 101–111)
GFR calc non Af Amer: >60 mL/min (ref >60)
Glucose, Bld: 338 mg/dL — ABNORMAL HIGH (ref 65–99)
Potassium: 3.9 mmol/L (ref 3.5–5.1)
Sodium: 135 mmol/L (ref 135–145)

## 2015-04-19 LAB — CBC
HCT: 37.7 % (ref 35.0–47.0)
Hemoglobin: 11.8 g/dL — ABNORMAL LOW (ref 12.0–16.0)
MCH: 19.6 pg — AB (ref 26.0–34.0)
MCHC: 31.4 g/dL — ABNORMAL LOW (ref 32.0–36.0)
MCV: 62.4 fL — ABNORMAL LOW (ref 80.0–100.0)
PLATELETS: 167 10*3/uL (ref 150–440)
RBC: 6.04 MIL/uL — ABNORMAL HIGH (ref 3.80–5.20)
RDW: 18 % — ABNORMAL HIGH (ref 11.5–14.5)
WBC: 6.9 10*3/uL (ref 3.6–11.0)

## 2015-04-19 MED ORDER — LISINOPRIL-HYDROCHLOROTHIAZIDE 20-12.5 MG PO TABS: 1.0000 | ORAL_TABLET | Freq: Every day | ORAL | Status: DC

## 2015-04-19 NOTE — ED Provider Notes (Signed)
[facility] Emergency Department Provider Note  ____________________________________________  Time seen: 1 PM  I have reviewed the triage vital signs and the nursing notes.   HISTORY  Chief Complaint Dizziness    HPI [name] is a 49 y.o. female who presents with complaints of mild dizziness that started last night while she was watching TV. She denies headache. She denies neuro deficits. No chest pain or palpitations. Today she feels better but still occasionally having dizziness. She is never had this before. She reports she thinks is because she has been taking her blood pressure medication because she ran out. No fevers no chills     Past Medical History  Diagnosis Date  . HTN (hypertension)   . Diabetes   . Hyperlipidemia   . Depression   . Stroke     Patient Active Problem List   Diagnosis Date Noted  . Headache(784.0) 03/19/2014  . Severe obesity (BMI >= 40) 03/19/2014  . HTN (hypertension)   . Diabetes   . Hyperlipidemia   . [name] (transient ischemic attack) 03/17/2014    Past Surgical History  Procedure Laterality Date  . Cesarean section      Current Outpatient Rx  Name  Route  Sig  Dispense  Refill  . aspirin EC 325 MG EC tablet   Oral   Take 1 tablet (325 mg total) by mouth daily.   30 tablet   0   . glipiZIDE (GLUCOTROL XL) 5 MG 24 hr tablet   Oral   Take 5 mg by mouth daily.         . Iron TABS   Oral   Take 1 tablet by mouth daily. Strength unknown. otc.         . lisinopril-hydrochlorothiazide (PRINZIDE,ZESTORETIC) 20-12.5 MG per tablet   Oral   Take 1 tablet by mouth daily.   30 tablet   1   . metFORMIN (GLUCOPHAGE) 1000 MG tablet   Oral   Take 1,000 mg by mouth 2 (two) times daily with a meal.         . PARoxetine (PAXIL) 20 MG tablet   Oral   Take 20 mg by mouth daily.         . rosuvastatin (CRESTOR) 10 MG tablet   Oral   Take 10 mg by mouth daily.         Marland Kitchen topiramate (TOPAMAX) 50 MG  tablet   Oral   Take 1 tablet (50 mg total) by mouth daily.   60 tablet   2     Take 50 mg q hs x 1 week, then increase to bid     Allergies Review of patient's allergies indicates no known allergies.  Family History  Problem Relation Age of Onset  . Hypertension Mother   . Hypertension Father     Social History History  Substance Use Topics  . Smoking status: Never Smoker   . Smokeless tobacco: Not on file  . Alcohol Use: No    Review of Systems  Constitutional: Negative for fever. Eyes: Negative for visual changes. ENT: Negative for sore throat Cardiovascular: Negative for chest pain. Respiratory: Negative for shortness of breath. Gastrointestinal: Negative for abdominal pain, vomiting and diarrhea. Genitourinary: Negative for dysuria. Musculoskeletal: Negative for back pain. Skin: Negative for rash. Neurological: Negative for headaches or focal weakness Psychiatric no anxiety    ____________________________________________   PHYSICAL EXAM:  VITAL SIGNS: ED Triage Vitals  Enc Vitals Group     BP  04/19/15 1153 161/84 mmHg     Pulse Rate 04/19/15 1153 84     Resp 04/19/15 1153 18     Temp 04/19/15 1153 98.3 F (36.8 C)     Temp Source 04/19/15 1153 Oral     SpO2 04/19/15 1153 96 %     Weight 04/19/15 1153 240 lb (108.863 kg)     Height 04/19/15 1153 5\' 2"  (1.575 m)     Head Cir --      Peak Flow --      Pain Score 04/19/15 1158 0     Pain Loc --      Pain Edu? --      Excl. in GC? --      Constitutional: Alert and oriented. Well appearing and in no distress. Pleasant and interactive Eyes: Conjunctivae are normal. PERRLA ENT   Head: Normocephalic and atraumatic.   Mouth/Throat: Mucous membranes are moist. Cardiovascular: Normal rate, regular rhythm. Normal and symmetric distal pulses are present in all extremities. No murmurs, rubs, or gallops. Respiratory: Normal respiratory effort without tachypnea nor retractions. Breath sounds are  clear and equal bilaterally.  Gastrointestinal: Soft and non-tender in all quadrants. No distention. There is no CVA tenderness. Genitourinary: deferred Musculoskeletal: Nontender with normal range of motion in all extremities. No lower extremity tenderness nor edema. Neurologic:  Normal speech and language. No gross focal neurologic deficits are appreciated. Cranial nerves II-12 are intact  Skin:  Skin is warm, dry and intact. No rash noted. Psychiatric: Mood and affect are normal. Patient exhibits appropriate insight and judgment.  ____________________________________________    LABS (pertinent positives/negatives)  Labs Reviewed  BASIC METABOLIC PANEL - Abnormal; Notable for the following:    Chloride 98 (*)    Glucose, Bld 338 (*)    All other components within normal limits  CBC - Abnormal; Notable for the following:    RBC 6.04 (*)    Hemoglobin 11.8 (*)    MCV 62.4 (*)    MCH 19.6 (*)    MCHC 31.4 (*)    RDW 18.0 (*)    All other components within normal limits  URINALYSIS COMPLETEWITH MICROSCOPIC ([facility] ONLY)  CBG MONITORING, ED    ____________________________________________   EKG  ED ECG REPORT I, Jene Every, the attending physician, personally viewed and interpreted this ECG.   Date: 04/19/2015  EKG Time: 12:15 PM  Rate: 83  Rhythm: normal sinus rhythm  Axis: Normal  Intervals:none  ST&T Change: Nonspecific   ____________________________________________    RADIOLOGY I have personally reviewed any xrays that were ordered on this patient: None  ____________________________________________   PROCEDURES  Procedure(s) performed: none  Critical Care performed: none  ____________________________________________   INITIAL IMPRESSION / ASSESSMENT AND PLAN / ED COURSE  Pertinent labs & imaging results that were available during my care of the patient were reviewed by me and considered in my medical decision making (see chart for  details).  Patient with elevated glucose but normal anion gap. She has refused an IV for normal saline as she is scared of needles. We will check orthostatics and I have encouraged her to hydrate   ----------------------------------------- 2:22 PM on 04/19/2015 -----------------------------------------  Patient feeling well she had no dizziness with orthostatic vital signs. She will follow-up with her PCP. I will refill her hypertension medication  ____________________________________________   FINAL CLINICAL IMPRESSION(S) / ED DIAGNOSES  Final diagnoses:  Dizziness  Hyperglycemia     Jene Every, MD 04/19/15 1455

## 2015-04-19 NOTE — Discharge Instructions (Signed)

## 2015-04-19 NOTE — ED Notes (Signed)
Pt reports to ED w/ c/o dizziness.  Pt sts it began last night.  Pt denies, LOC, SOB, fall and weakness.

## 2015-05-18 ENCOUNTER — Emergency Department: Payer: BLUE CROSS/BLUE SHIELD

## 2015-05-18 ENCOUNTER — Observation Stay
Admission: EM | Admit: 2015-05-18 | Discharge: 2015-05-19 | DRG: 103 | Disposition: A | Discharge status: Home / Self Care | Payer: BLUE CROSS/BLUE SHIELD | Attending: Internal Medicine | Admitting: Internal Medicine

## 2015-05-18 ENCOUNTER — Encounter: Payer: Self-pay | Admitting: *Deleted

## 2015-05-18 DIAGNOSIS — R51 Headache: Secondary | ICD-10-CM

## 2015-05-18 DIAGNOSIS — Z7982 Long term (current) use of aspirin: Secondary | ICD-10-CM

## 2015-05-18 DIAGNOSIS — Z8249 Family history of ischemic heart disease and other diseases of the circulatory system: Secondary | ICD-10-CM | POA: Diagnosis not present

## 2015-05-18 DIAGNOSIS — M6289 Other specified disorders of muscle: Secondary | ICD-10-CM | POA: Diagnosis present

## 2015-05-18 DIAGNOSIS — I69354 Hemiplegia and hemiparesis following cerebral infarction affecting left non-dominant side: Secondary | ICD-10-CM

## 2015-05-18 DIAGNOSIS — E1165 Type 2 diabetes mellitus with hyperglycemia: Secondary | ICD-10-CM | POA: Diagnosis present

## 2015-05-18 DIAGNOSIS — G40909 Epilepsy, unspecified, not intractable, without status epilepticus: Secondary | ICD-10-CM | POA: Diagnosis present

## 2015-05-18 DIAGNOSIS — E785 Hyperlipidemia, unspecified: Secondary | ICD-10-CM | POA: Diagnosis not present

## 2015-05-18 DIAGNOSIS — F329 Major depressive disorder, single episode, unspecified: Secondary | ICD-10-CM | POA: Diagnosis present

## 2015-05-18 DIAGNOSIS — I639 Cerebral infarction, unspecified: Secondary | ICD-10-CM

## 2015-05-18 DIAGNOSIS — I10 Essential (primary) hypertension: Secondary | ICD-10-CM | POA: Diagnosis present

## 2015-05-18 DIAGNOSIS — R519 Headache, unspecified: Secondary | ICD-10-CM | POA: Diagnosis present

## 2015-05-18 DIAGNOSIS — G43919 Migraine, unspecified, intractable, without status migrainosus: Principal | ICD-10-CM | POA: Diagnosis present

## 2015-05-18 DIAGNOSIS — E119 Type 2 diabetes mellitus without complications: Secondary | ICD-10-CM

## 2015-05-18 DIAGNOSIS — R531 Weakness: Secondary | ICD-10-CM | POA: Diagnosis present

## 2015-05-18 DIAGNOSIS — Z8673 Personal history of transient ischemic attack (TIA), and cerebral infarction without residual deficits: Secondary | ICD-10-CM

## 2015-05-18 LAB — COMPREHENSIVE METABOLIC PANEL
ALBUMIN: 4 g/dL (ref 3.5–5.0)
ALT: 14 U/L (ref 14–54)
AST: 15 U/L (ref 15–41)
Alkaline Phosphatase: 48 U/L (ref 38–126)
Anion gap: 9 (ref 5–15)
BUN: 18 mg/dL (ref 6–20)
CHLORIDE: 102 mmol/L (ref 101–111)
CO2: 29 mmol/L (ref 22–32)
Calcium: 9.5 mg/dL (ref 8.9–10.3)
Creatinine, Ser: 0.9 mg/dL (ref 0.44–1.00)
GFR calc Af Amer: >60 mL/min (ref >60)
GFR calc non Af Amer: >60 mL/min (ref >60)
GLUCOSE: 239 mg/dL — AB (ref 65–99)
POTASSIUM: 3.8 mmol/L (ref 3.5–5.1)
Sodium: 140 mmol/L (ref 135–145)
Total Bilirubin: 0.7 mg/dL (ref 0.3–1.2)
Total Protein: 7.4 g/dL (ref 6.5–8.1)

## 2015-05-18 LAB — CBC WITH DIFFERENTIAL/PLATELET
Basophils Absolute: 0.1 10*3/uL (ref 0–0.1)
Basophils Relative: 1 %
Eosinophils Absolute: 0.1 10*3/uL (ref 0–0.7)
Eosinophils Relative: 1 %
HEMATOCRIT: 37.2 % (ref 35.0–47.0)
Hemoglobin: 11.8 g/dL — ABNORMAL LOW (ref 12.0–16.0)
LYMPHS ABS: 2 10*3/uL (ref 1.0–3.6)
MCH: 20 pg — ABNORMAL LOW (ref 26.0–34.0)
MCHC: 31.7 g/dL — AB (ref 32.0–36.0)
MCV: 63.1 fL — AB (ref 80.0–100.0)
MONO ABS: 1.1 10*3/uL — AB (ref 0.2–0.9)
NEUTROS ABS: 5.6 10*3/uL (ref 1.4–6.5)
Neutrophils Relative %: 63 %
Platelets: 194 10*3/uL (ref 150–440)
RBC: 5.89 MIL/uL — ABNORMAL HIGH (ref 3.80–5.20)
RDW: 18.9 % — AB (ref 11.5–14.5)
WBC: 8.9 10*3/uL (ref 3.6–11.0)

## 2015-05-18 LAB — ETHANOL

## 2015-05-18 LAB — PROTIME-INR
INR: 0.94
Prothrombin Time: 12.8 seconds (ref 11.4–15.0)

## 2015-05-18 LAB — APTT: aPTT: 28 seconds (ref 24–36)

## 2015-05-18 LAB — TROPONIN I: Troponin I: <0.03 ng/mL (ref <0.031)

## 2015-05-18 MED ORDER — HYDROMORPHONE HCL 1 MG/ML IJ SOLN
0.5000 mg | Freq: Once | INTRAMUSCULAR | Status: AC
  Administered 2015-05-18: 0.5 mg via INTRAVENOUS

## 2015-05-18 MED ORDER — METOCLOPRAMIDE HCL 5 MG/ML IJ SOLN
10.0000 mg | Freq: Once | INTRAMUSCULAR | Status: AC
  Administered 2015-05-18: 10 mg via INTRAVENOUS

## 2015-05-18 MED ORDER — METOCLOPRAMIDE HCL 5 MG/ML IJ SOLN
INTRAMUSCULAR | Status: AC
  Administered 2015-05-18: 10 mg via INTRAVENOUS
  Filled 2015-05-18: qty 2

## 2015-05-18 MED ORDER — HYDROMORPHONE HCL 1 MG/ML IJ SOLN
INTRAMUSCULAR | Status: AC
  Administered 2015-05-18: 0.5 mg via INTRAVENOUS
  Filled 2015-05-18: qty 1

## 2015-05-18 NOTE — ED Notes (Signed)
Bryan RN at bedside to obtain IV access

## 2015-05-18 NOTE — ED Notes (Signed)
Report received from [facility] Patient care assumed. Patient/RN introduction complete. Will continue to monitor.

## 2015-05-18 NOTE — ED Notes (Signed)
Two unsuccessful IV attempts. Will defer to another RN. Patient has gone to CT scan.

## 2015-05-18 NOTE — ED Notes (Signed)
Patient unable to void at this time

## 2015-05-18 NOTE — ED Provider Notes (Signed)
[facility] Emergency Department Provider Note  ____________________________________________  Time seen: 2028  I have reviewed the triage vital signs and the nursing notes.   HISTORY  Chief Complaint Headache     HPI [name] is a 49 y.o. 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 This is in the left periorbital area and also in the back of her head. It started at 445. A continues now and is the worst headache she has had since she had a previous CVA. She is unable to tell me if her prior CVA was ischemic or hemorrhagic.  She does report that her left side feels heavy today.  She does have diabetes and hypertension.  The patient denies fever, chest pain, or shortness of breath.    Past Medical History  Diagnosis Date  . HTN (hypertension)   . Diabetes   . Hyperlipidemia   . Depression   . Stroke     Patient Active Problem List   Diagnosis Date Noted  . Headache(784.0) 03/19/2014  . Severe obesity (BMI >= 40) 03/19/2014  . HTN (hypertension)   . Diabetes   . Hyperlipidemia   . [name] (transient ischemic attack) 03/17/2014    Past Surgical History  Procedure Laterality Date  . Cesarean section      Current Outpatient Rx  Name  Route  Sig  Dispense  Refill  . aspirin EC 325 MG EC tablet   Oral   Take 1 tablet (325 mg total) by mouth daily.   30 tablet   0   . glipiZIDE (GLUCOTROL XL) 5 MG 24 hr tablet   Oral   Take 5 mg by mouth daily.         . Iron TABS   Oral   Take 1 tablet by mouth daily. Strength unknown. otc.         . lisinopril-hydrochlorothiazide (PRINZIDE,ZESTORETIC) 20-12.5 MG per tablet   Oral   Take 1 tablet by mouth daily.   30 tablet   1   . metFORMIN (GLUCOPHAGE) 1000 MG tablet   Oral   Take 1,000 mg by mouth 2 (two) times daily with a meal.         . PARoxetine (PAXIL) 20 MG tablet   Oral   Take 20 mg by mouth daily.         .  rosuvastatin (CRESTOR) 10 MG tablet   Oral   Take 10 mg by mouth daily.         Marland Kitchen topiramate (TOPAMAX) 50 MG tablet   Oral   Take 1 tablet (50 mg total) by mouth daily.   60 tablet   2     Take 50 mg q hs x 1 week, then increase to bid     Allergies Review of patient's allergies indicates no known allergies.  Family History  Problem Relation Age of Onset  . Hypertension Mother   . Hypertension Father     Social History Social History  Substance Use Topics  . Smoking status: Never Smoker   . Smokeless tobacco: None  . Alcohol Use: No    Review of Systems  Constitutional: Negative for fever. ENT: Negative for sore throat. Cardiovascular: Negative for chest pain. Respiratory: Negative for shortness of breath. Gastrointestinal: Negative for abdominal pain, vomiting and diarrhea. Genitourinary: Negative for dysuria. Musculoskeletal: No myalgias or injuries. Skin: Negative for rash. Neurological: Severe headache and heaviness on the left side.  See history of present illness   10-point ROS otherwise negative.  ____________________________________________   PHYSICAL EXAM:  VITAL SIGNS: ED Triage Vitals  Enc Vitals Group     BP 05/18/15 2016 145/88 mmHg     Pulse Rate 05/18/15 2016 82     Resp 05/18/15 2016 18     Temp 05/18/15 2016 98.3 F (36.8 C)     Temp Source 05/18/15 2016 Oral     SpO2 05/18/15 2016 94 %     Weight 05/18/15 2016 227 lb (102.967 kg)     Height 05/18/15 2016 5\' 2"  (1.575 m)     Head Cir --      Peak Flow --      Pain Score 05/18/15 2019 10     Pain Loc --      Pain Edu? --      Excl. in GC? --     Constitutional:  Alert and oriented. Communicative. Patient looks uncomfortable, but no acute distress. ENT   Head: Normocephalic and atraumatic.   Nose: No congestion/rhinnorhea.   Mouth/Throat: Mucous membranes are moist. Cardiovascular: Normal rate, regular rhythm, no murmur noted Respiratory:  Normal respiratory effort,  no tachypnea.    Breath sounds are clear and equal bilaterally.  Gastrointestinal: Soft and nontender. No distention.  Back: No muscle spasm, no tenderness, no CVA tenderness. Musculoskeletal: No deformity noted. Nontender with normal range of motion in all extremities.  No noted edema. Neurologic:  Normal speech and language. Grip strength appears somewhat weak bilaterally. She has 5 over 5 strength in the right leg, but 3-4 over 5 strength in the left leg. She reported feels heavy. She has 4-5 strength in both arms, able to raise them but with a lack of coordination. This is more pronounced in the left arm.  Skin:  Skin is warm, dry. No rash noted. Psychiatric: Mood and affect are normal. Speech and behavior are normal.  ____________________________________________    LABS (pertinent positives/negatives)  Labs Reviewed  COMPREHENSIVE METABOLIC PANEL - Abnormal; Notable for the following:    Glucose, Bld 239 (*)    All other components within normal limits  CBC WITH DIFFERENTIAL/PLATELET - Abnormal; Notable for the following:    RBC 5.89 (*)    Hemoglobin 11.8 (*)    MCV 63.1 (*)    MCH 20.0 (*)    MCHC 31.7 (*)    RDW 18.9 (*)    Monocytes Absolute 1.1 (*)    All other components within normal limits  APTT  TROPONIN I  PROTIME-INR  ETHANOL  URINALYSIS COMPLETEWITH MICROSCOPIC ([facility] ONLY)  URINE DRUG SCREEN, QUALITATIVE ([facility] ONLY)     ____________________________________________   EKG  ED ECG REPORT I, Garry Nicolini W, the attending physician, personally viewed and interpreted this ECG.   Date: 05/18/2015  EKG Time: 2210  Rate: 83  Rhythm: Normal sinus rhythm  Axis: Left axis at -23  Intervals: Normal  ST&T Change: Flat T-wave in lead 3.   ____________________________________________    RADIOLOGY  CT head  IMPRESSION: 1. No acute intracranial pathology. 2. Old right MCA territory  infarct.  ____________________________________________   PROCEDURES  CRITICAL CARE Performed by: Darien Ramus   Total critical care time: 40 minutes due to the critical nature of this patient's presentation was severe headache and focal neurologic deficit, CVA, This included reevaluation of the patient. Speaking with her family, and speaking with the admitting physician.  Critical care time was exclusive of separately billable procedures and treating other patients.  Critical care was necessary to treat or prevent imminent or life-threatening deterioration.  Critical care was time spent personally by me on the following activities: development of treatment plan with patient and/or surrogate as well as nursing, discussions with consultants, evaluation of patient's response to treatment, examination of patient, obtaining history from patient or surrogate, ordering and performing treatments and interventions, ordering and review of laboratory studies, ordering and review of radiographic studies, pulse oximetry and re-evaluation of patient's condition.  ____________________________________________   INITIAL IMPRESSION / ASSESSMENT AND PLAN / ED COURSE  Pertinent labs & imaging results that were available during my care of the patient were reviewed by me and considered in my medical decision making (see chart for details).  49 year old female with a history of left-sided stroke, now with severe pain and with dysfunction her left leg and bilateral arms. With the initial presentation of pain behind her left eye, I was concerned this might be a cluster headache. She was treated with high flow oxygen through a nonrebreather mask. She had no change in her symptoms after 15 minutes. Her head CT does not show a bleed or other acute changes. We have discussed the case with Dr. Anne Hahn for admission hospital and ongoing care of this likely  CVA.  ____________________________________________   FINAL CLINICAL IMPRESSION(S) / ED DIAGNOSES  Final diagnoses:  Cerebral infarction due to unspecified mechanism  Acute intractable headache, unspecified headache type  Left-sided weakness      Darien Ramus, MD 05/18/15 2252

## 2015-05-18 NOTE — ED Notes (Signed)
Pt resting with eyes closed, no co pain at this time awaiting admission.

## 2015-05-18 NOTE — ED Notes (Signed)
Pt states that she has had a headache since around 4pm today (left frontal headache that goes to her neck), associated with overall weakness, dizziness, nausea, describes a "glossy vision." Pt states this is the worse headache she has had since her Stroke (most recent Stroke in April).

## 2015-05-19 ENCOUNTER — Encounter: Payer: Self-pay | Admitting: Internal Medicine

## 2015-05-19 ENCOUNTER — Inpatient Hospital Stay: Payer: BLUE CROSS/BLUE SHIELD

## 2015-05-19 ENCOUNTER — Inpatient Hospital Stay (HOSPITAL_COMMUNITY)
Admit: 2015-05-19 | Discharge: 2015-05-19 | Disposition: A | Discharge status: Home / Self Care | Payer: BLUE CROSS/BLUE SHIELD | Attending: Internal Medicine | Admitting: Internal Medicine

## 2015-05-19 DIAGNOSIS — I639 Cerebral infarction, unspecified: Secondary | ICD-10-CM

## 2015-05-19 DIAGNOSIS — R519 Headache, unspecified: Secondary | ICD-10-CM | POA: Diagnosis present

## 2015-05-19 DIAGNOSIS — Z8673 Personal history of transient ischemic attack (TIA), and cerebral infarction without residual deficits: Secondary | ICD-10-CM

## 2015-05-19 DIAGNOSIS — R51 Headache: Secondary | ICD-10-CM

## 2015-05-19 DIAGNOSIS — R531 Weakness: Secondary | ICD-10-CM | POA: Diagnosis present

## 2015-05-19 LAB — URINALYSIS COMPLETE WITH MICROSCOPIC (ARMC ONLY)
BACTERIA UA: NONE SEEN
Bilirubin Urine: NEGATIVE
GLUCOSE, UA: 150 mg/dL — AB
HGB URINE DIPSTICK: NEGATIVE
Leukocytes, UA: NEGATIVE
Nitrite: NEGATIVE
PH: 5 (ref 5.0–8.0)
PROTEIN: NEGATIVE mg/dL
Specific Gravity, Urine: 1.031 — ABNORMAL HIGH (ref 1.005–1.030)

## 2015-05-19 LAB — URINE DRUG SCREEN, QUALITATIVE (ARMC ONLY)
Amphetamines, Ur Screen: NOT DETECTED
BARBITURATES, UR SCREEN: NOT DETECTED
BENZODIAZEPINE, UR SCRN: NOT DETECTED
Cannabinoid 50 Ng, Ur ~~LOC~~: NOT DETECTED
Cocaine Metabolite,Ur ~~LOC~~: NOT DETECTED
MDMA (Ecstasy)Ur Screen: NOT DETECTED
METHADONE SCREEN, URINE: NOT DETECTED
Opiate, Ur Screen: POSITIVE — AB
Phencyclidine (PCP) Ur S: NOT DETECTED
TRICYCLIC, UR SCREEN: NOT DETECTED

## 2015-05-19 LAB — GLUCOSE, CAPILLARY
GLUCOSE-CAPILLARY: 280 mg/dL — AB (ref 65–99)
GLUCOSE-CAPILLARY: 279 mg/dL — AB (ref 65–99)
GLUCOSE-CAPILLARY: 195 mg/dL — AB (ref 65–99)
Glucose-Capillary: 221 mg/dL — ABNORMAL HIGH (ref 65–99)
Glucose-Capillary: 215 mg/dL — ABNORMAL HIGH (ref 65–99)

## 2015-05-19 MED ORDER — MAGNESIUM SULFATE 50 % IJ SOLN
0.5000 g | Freq: Four times a day (QID) | INTRAVENOUS | Status: DC
  Filled 2015-05-19 (×4): qty 1

## 2015-05-19 MED ORDER — DIVALPROEX SODIUM ER 500 MG PO TB24
750.0000 mg | ORAL_TABLET | Freq: Once | ORAL | Status: AC
  Administered 2015-05-19: 750 mg via ORAL
  Filled 2015-05-19: qty 1

## 2015-05-19 MED ORDER — VALPROIC ACID 250 MG PO CAPS: 250.0000 mg | ORAL_CAPSULE | Freq: Two times a day (BID) | ORAL | Status: DC

## 2015-05-19 MED ORDER — METFORMIN HCL 500 MG PO TABS
1000.0000 mg | ORAL_TABLET | Freq: Two times a day (BID) | ORAL | Status: DC
  Administered 2015-05-19 (×2): 1000 mg via ORAL
  Filled 2015-05-19 (×2): qty 2

## 2015-05-19 MED ORDER — LISINOPRIL-HYDROCHLOROTHIAZIDE 20-12.5 MG PO TABS: 1.0000 | ORAL_TABLET | Freq: Every day | ORAL | Status: DC

## 2015-05-19 MED ORDER — DEXTROSE 5 % IV SOLN
750.0000 mg | Freq: Once | INTRAVENOUS | Status: DC
  Filled 2015-05-19: qty 7.5

## 2015-05-19 MED ORDER — METOCLOPRAMIDE HCL 5 MG/ML IJ SOLN: 5.0000 mg | Freq: Four times a day (QID) | INTRAMUSCULAR | Status: DC

## 2015-05-19 MED ORDER — HYDROCODONE-ACETAMINOPHEN 5-325 MG PO TABS
1.0000 | ORAL_TABLET | ORAL | Status: DC | PRN
  Administered 2015-05-19: 1 via ORAL
  Filled 2015-05-19: qty 1

## 2015-05-19 MED ORDER — ASPIRIN EC 81 MG PO TBEC: 81.0000 mg | DELAYED_RELEASE_TABLET | Freq: Every day | ORAL | Status: DC

## 2015-05-19 MED ORDER — HYDROCHLOROTHIAZIDE 12.5 MG PO CAPS
12.5000 mg | ORAL_CAPSULE | Freq: Every day | ORAL | Status: DC
  Administered 2015-05-19: 12.5 mg via ORAL
  Filled 2015-05-19: qty 1

## 2015-05-19 MED ORDER — HYDROCODONE-ACETAMINOPHEN 5-325 MG PO TABS: 1.0000 | ORAL_TABLET | ORAL | Status: DC | PRN

## 2015-05-19 MED ORDER — LISINOPRIL 20 MG PO TABS: 20.0000 mg | ORAL_TABLET | Freq: Every day | ORAL | Status: DC

## 2015-05-19 MED ORDER — KETOROLAC TROMETHAMINE 15 MG/ML IJ SOLN: 15.0000 mg | Freq: Four times a day (QID) | INTRAMUSCULAR | Status: DC

## 2015-05-19 MED ORDER — TOPIRAMATE 25 MG PO TABS
50.0000 mg | ORAL_TABLET | Freq: Every day | ORAL | Status: DC
Start: 2015-05-19 — End: 2015-05-19
  Administered 2015-05-19: 50 mg via ORAL
  Filled 2015-05-19: qty 2

## 2015-05-19 MED ORDER — PAROXETINE HCL 20 MG PO TABS
20.0000 mg | ORAL_TABLET | Freq: Every day | ORAL | Status: DC
  Administered 2015-05-19: 10:00:00 20 mg via ORAL
  Filled 2015-05-19: qty 1

## 2015-05-19 MED ORDER — SODIUM CHLORIDE 0.9 % IV SOLN
INTRAVENOUS | Status: DC
  Administered 2015-05-19: 02:00:00 via INTRAVENOUS

## 2015-05-19 MED ORDER — HYDROMORPHONE HCL 1 MG/ML IJ SOLN
0.5000 mg | INTRAMUSCULAR | Status: DC | PRN
  Administered 2015-05-19 (×2): 0.5 mg via INTRAVENOUS
  Filled 2015-05-19 (×3): qty 1

## 2015-05-19 MED ORDER — ENOXAPARIN SODIUM 40 MG/0.4ML ~~LOC~~ SOLN
40.0000 mg | Freq: Every day | SUBCUTANEOUS | Status: DC
  Administered 2015-05-19: 02:00:00 40 mg via SUBCUTANEOUS
  Filled 2015-05-19: qty 0.4

## 2015-05-19 MED ORDER — INSULIN ASPART 100 UNIT/ML ~~LOC~~ SOLN
0.0000 [IU] | SUBCUTANEOUS | Status: DC
  Administered 2015-05-19: 02:00:00 5 [IU] via SUBCUTANEOUS
  Administered 2015-05-19: 15:00:00 3 [IU] via SUBCUTANEOUS
  Administered 2015-05-19: 5 [IU] via SUBCUTANEOUS
  Administered 2015-05-19: 3 [IU] via SUBCUTANEOUS
  Administered 2015-05-19: 10:00:00 2 [IU] via SUBCUTANEOUS
  Filled 2015-05-19: qty 3
  Filled 2015-05-19 (×2): qty 5
  Filled 2015-05-19: qty 2
  Filled 2015-05-19: qty 3

## 2015-05-19 MED ORDER — FERROUS SULFATE 325 (65 FE) MG PO TABS
325.0000 mg | ORAL_TABLET | Freq: Every day | ORAL | Status: DC
  Administered 2015-05-19: 325 mg via ORAL
  Filled 2015-05-19: qty 1

## 2015-05-19 MED ORDER — ONDANSETRON HCL 4 MG PO TABS: 4.0000 mg | ORAL_TABLET | Freq: Four times a day (QID) | ORAL | Status: DC | PRN

## 2015-05-19 MED ORDER — GLIPIZIDE ER 2.5 MG PO TB24
5.0000 mg | ORAL_TABLET | Freq: Every day | ORAL | Status: DC
  Administered 2015-05-19: 5 mg via ORAL
  Filled 2015-05-19: qty 1
  Filled 2015-05-19: qty 2

## 2015-05-19 MED ORDER — SODIUM CHLORIDE 0.9 % IJ SOLN
3.0000 mL | Freq: Two times a day (BID) | INTRAMUSCULAR | Status: DC
  Administered 2015-05-19 (×2): 3 mL via INTRAVENOUS

## 2015-05-19 MED ORDER — ROSUVASTATIN CALCIUM 10 MG PO TABS
10.0000 mg | ORAL_TABLET | Freq: Every day | ORAL | Status: DC
  Administered 2015-05-19: 10 mg via ORAL
  Filled 2015-05-19: qty 1

## 2015-05-19 MED ORDER — ONDANSETRON HCL 4 MG/2ML IJ SOLN
4.0000 mg | Freq: Four times a day (QID) | INTRAMUSCULAR | Status: DC | PRN
  Administered 2015-05-19: 10:00:00 4 mg via INTRAVENOUS
  Filled 2015-05-19: qty 2

## 2015-05-19 MED ORDER — ASPIRIN EC 325 MG PO TBEC
325.0000 mg | DELAYED_RELEASE_TABLET | Freq: Every day | ORAL | Status: DC
  Administered 2015-05-19: 10:00:00 325 mg via ORAL
  Filled 2015-05-19: qty 1

## 2015-05-19 MED ORDER — BUTALBITAL-APAP-CAFFEINE 50-325-40 MG PO TABS: 1.0000 | ORAL_TABLET | ORAL | Status: DC | PRN

## 2015-05-19 MED ORDER — TOPIRAMATE 25 MG PO TABS: 50.0000 mg | ORAL_TABLET | Freq: Two times a day (BID) | ORAL | Status: DC

## 2015-05-19 MED ORDER — ACETAMINOPHEN 325 MG PO TABS: 650.0000 mg | ORAL_TABLET | Freq: Four times a day (QID) | ORAL | Status: DC | PRN

## 2015-05-19 MED ORDER — ACETAMINOPHEN 650 MG RE SUPP: 650.0000 mg | Freq: Four times a day (QID) | RECTAL | Status: DC | PRN

## 2015-05-19 NOTE — Clinical Social Work Note (Signed)
Clinical Social Work Assessment  Patient Details  Name: [name] MRN: [id number] Date of Birth: [birth year]  Date of referral:  05/19/15               Reason for consult:  Facility Placement                Permission sought to share information with:    Permission granted to share information::     Name::        Agency::     Relationship::     Contact Information:     Housing/Transportation Living arrangements for the past 2 months:   (home) Source of Information:  Patient Patient Interpreter Needed:  None Criminal Activity/Legal Involvement Pertinent to Current Situation/Hospitalization:  No - Comment as needed Significant Relationships:  Spouse Lives with:  Spouse Do you feel safe going back to the place where you live?  Yes Need for family participation in patient care:  Yes (Comment)  Care giving concerns:  Patient lives at home with her husband.   Social Worker assessment / plan:  PT completed their assessment of patient and then came to [place] and informed CSW that based upon what patient demonstrated with them today, they would have to recommend STR however, they feel as though patient is able to do more than she is demonstrating. CSW met with patient and informed her of PT recommendation and she quickly spoke up and stated she was not going to a nursing home for rehab and that she would return home with her husband. She stated she felt she would be safe enough returning home. Patient states she is able to get her necessities with no issues or concerns and that she uses a walker sometimes if she needs it.  Employment status:    Insurance information:  Managed Care PT Recommendations:  [place] / Referral to community resources:     Patient/Family's Response to care:  Patient reacted shocked to the information that PT had recommended STR.   Patient/Family's Understanding of and Emotional Response to Diagnosis, Current Treatment, and Prognosis:   Patient verbalized appreciation for CSW assistance but stated she would return home.  Emotional Assessment Appearance:  Appears stated age Attitude/Demeanor/Rapport:  Guarded Affect (typically observed):  Appropriate, Blunt Orientation:  Oriented to Self, Oriented to Place, Oriented to  Time, Oriented to Situation Alcohol / Substance use:  Not Applicable Psych involvement (Current and /or in the community):  No (Comment)  Discharge Needs  Concerns to be addressed:  No discharge needs identified Readmission within the last 30 days:  No Current discharge risk:  None Barriers to Discharge:  No Barriers Identified   Shela Leff, LCSW 05/19/2015, 10:18 AM

## 2015-05-19 NOTE — Progress Notes (Signed)
Initial Nutrition Assessment   INTERVENTION:   Meals and Snacks: Cater to patient preferences Medical Food Supplement Therapy: will recommend on follow if intake poor Education: pt would likely benefit from diabetic nutrition therapy on follow   NUTRITION DIAGNOSIS:   Inadequate oral intake related to inability to eat as evidenced by NPO status.  GOAL:   Patient will meet greater than or equal to 90% of their needs  MONITOR:    (Energy Intake, glucose Profile, Anthropometrics)  REASON FOR ASSESSMENT:   Malnutrition Screening Tool    ASSESSMENT:   Pt admitted with headache, possible CVA. Pt leaving for procedure this am on visit. Pt with h/o legal blindness.   Past Medical History  Diagnosis Date  . HTN (hypertension)   . Diabetes   . Hyperlipidemia   . Depression   . Stroke     Diet Order:  Diet Carb Modified Fluid consistency:: Thin; Room service appropriate?: Yes Diet - low sodium heart healthy Diet Carb Modified    Current Nutrition: Pt NPO this am. Pt reports not eating anything yesterday either. Pt c/o tooth ache on visit that had just started this am.   Food/Nutrition-Related History: Pt reports usually having a good appetite PTA.   Medications: Ferrous sulfate, glucotrol, Novolog, Metformin, NS at 19mL/hr  Electrolyte/Renal Profile and Glucose Profile:   Recent Labs Lab 05/18/15 2153  NA 140  K 3.8  CL 102  CO2 29  BUN 18  CREATININE 0.90  CALCIUM 9.5  GLUCOSE 239*   Protein Profile:   Recent Labs Lab 05/18/15 2153  ALBUMIN 4.0    Gastrointestinal Profile: Last BM:  05/18/2015   Nutrition-Focused Physical Exam Findings:  Unable to complete Nutrition-Focused physical exam at this time.    Weight Change: Pt reports weight has been 'OK' PTA. Per [facility] pt with weight gain.   Skin:  Reviewed, no issues   Height:   Ht Readings from Last 1 Encounters:  05/19/15  (1.575 m)    Weight:   Wt Readings from Last 1 Encounters:   05/19/15 248 lb 3.2 oz (112.583 kg)    Wt Readings from Last 10 Encounters:  05/19/15 248 lb 3.2 oz (112.583 kg)  04/19/15 240 lb (108.863 kg)  03/18/14 234 lb 5.6 oz (106.3 kg)     Ideal Body Weight:  50 kg  BMI:  Body mass index is 45.38 kg/(m^2).  Estimated Nutritional Needs:   Kcal:  1542-1855kcals, BEE: 1078kcals, TEE: (IF 1.1-1.3)(AF 1.3) using IBW of 50kg  Protein:  40-50g protein (0.8-1.0g/kg) using IBW of 50kg  Fluid:  1250-1512mL of fluid (25-5mL/kg) using IBW of 50kg  EDUCATION NEEDS:   Education needs no appropriate at this time at pt leaving for a procedure   MODERATE Care Level  Leda Quail, RD, LDN Pager [phone]

## 2015-05-19 NOTE — Care Management (Signed)
Admitted to this facility with the diagnosis of headache. Lives with husband, Dana, [name]). No home health. No skilled facility. No home oxygen. Uses a cane and rolling walker in the home. Self feed, but needs help with baths. Doesn't drive, Sees Dr. Dario Guardian, has seen him in the last year, No falls. Fair appetite.  Gwenette Greet RN MSN Care Management [phone]

## 2015-05-19 NOTE — Progress Notes (Signed)
Inpatient Diabetes Program Recommendations  AACE/ADA: New Consensus Statement on Inpatient Glycemic Control (2013)  Target Ranges:  Prepandial:   less than 140 mg/dL      Peak postprandial:   less than 180 mg/dL (1-2 hours)      Critically ill patients:  140 - 180 mg/dL   Reason for review: elevated blood sugars  Diabetes history: Type 2 Outpatient Diabetes medications: Glipizide /day, Metformin  bid Current orders for Inpatient glycemic control: Glipizide /day, Metformin  bid, Novolog 0-9 units 6x/day  Since patient is no longer NPO, consider changing Novolog correction insulin to 0-9 units tid and 0-5 units qhs  Consider ordering an A1C- last recorded A1C was 08/2014 and was 11.1%    Susette Racer, RN, [place], [place], CDE Diabetes Coordinator Inpatient Diabetes Program  [phone] (Team Pager) [phone] [facility] Office) 05/19/2015 8:51 AM

## 2015-05-19 NOTE — Discharge Instructions (Signed)

## 2015-05-19 NOTE — ED Notes (Signed)
Report called to receiving RN . Patient and family informed of room assignment, and plan for transfer. Patient in stable condition. Preparing for transfer 

## 2015-05-19 NOTE — Plan of Care (Signed)
Problem: Discharge/Transitional Outcomes Goal: Other Discharge Outcomes/Goals Outcome: Completed/Met Date Met:  05/19/15 Patient with headache and toothache most of day. Went to MRI and US carotid. Minimal eating with poor appetite due to headache and toothache. Up in chair with PT and tolerated well. Up to bathroom with assistance without difficulty. Depakote given po after discussing with Dr. Darvin Neighbours to make changes from IV to po. Discharged after reviewing DC instructions. Stated understanding. Discharged via wheelchair by staff.

## 2015-05-19 NOTE — Consult Note (Signed)
Reason for Consult: stroke Referring Physician: Dr. Gwenlyn Found is an 49 y.o. female.  HPI: seen at request of Dr. Darvin Neighbours for possible stroke;  49 yo RHD F with hx of L hemiparesis from prior stroke presents to [facility] with headache and increased weakness.  She has diagnosis of migraines but this headache is much worse than that.  She reports some mild photophobia as well.  She denies any shaking episodes or loss of consciousness.  This headache is much worse than normal and has her feeling tired.  Past Medical History  Diagnosis Date  . HTN (hypertension)   . Diabetes   . Hyperlipidemia   . Depression   . Stroke     Past Surgical History  Procedure Laterality Date  . Cesarean section      Family History  Problem Relation Age of Onset  . Hypertension Mother   . Hypertension Father     Social History:  reports that she has never smoked. She does not have any smokeless tobacco history on file. She reports that she does not drink alcohol. Her drug history is not on file.  Allergies: No Known Allergies  Medications: personally reviewed by me  Results for orders placed or performed during the hospital encounter of 05/18/15 (from the past 48 hour(s))  Urinalysis complete, with microscopic ([facility] only)     Status: Abnormal   Collection Time: 05/18/15  8:40 PM  Result Value Ref Range   Color, Urine YELLOW (A) YELLOW   APPearance CLEAR (A) CLEAR   Glucose, UA 150 (A) NEGATIVE mg/dL   Bilirubin Urine NEGATIVE NEGATIVE   Ketones, ur TRACE (A) NEGATIVE mg/dL   Specific Gravity, Urine 1.031 (H) 1.005 - 1.030   Hgb urine dipstick NEGATIVE NEGATIVE   pH 5.0 5.0 - 8.0   Protein, ur NEGATIVE NEGATIVE mg/dL   Nitrite NEGATIVE NEGATIVE   Leukocytes, UA NEGATIVE NEGATIVE   RBC / HPF 0-5 0 - 5 RBC/hpf   WBC, UA 0-5 0 - 5 WBC/hpf   Bacteria, UA NONE SEEN NONE SEEN   Squamous Epithelial / LPF 0-5 (A) NONE SEEN   Mucous PRESENT    Hyaline Casts, UA PRESENT    Oval Fat Body PRESENT    Urine Drug Screen, Qualitative ([facility] only)     Status: Abnormal   Collection Time: 05/18/15  8:40 PM  Result Value Ref Range   Tricyclic, Ur Screen NONE DETECTED NONE DETECTED   Amphetamines, Ur Screen NONE DETECTED NONE DETECTED   MDMA (Ecstasy)Ur Screen NONE DETECTED NONE DETECTED   Cocaine Metabolite,Ur [place] NONE DETECTED NONE DETECTED   Opiate, Ur Screen POSITIVE (A) NONE DETECTED   Phencyclidine (PCP) Ur S NONE DETECTED NONE DETECTED   Cannabinoid 50 Ng, Ur [place] NONE DETECTED NONE DETECTED   Barbiturates, Ur Screen NONE DETECTED NONE DETECTED   Benzodiazepine, Ur Scrn NONE DETECTED NONE DETECTED   Methadone Scn, Ur NONE DETECTED NONE DETECTED    Comment: (NOTE) 741  Tricyclics, urine               Cutoff 1000 ng/mL 200  Amphetamines, urine             Cutoff 1000 ng/mL 300  MDMA (Ecstasy), urine           Cutoff 500 ng/mL 400  Cocaine Metabolite, urine       Cutoff 300 ng/mL 500  Opiate, urine  Cutoff 300 ng/mL 600  Phencyclidine (PCP), urine      Cutoff 25 ng/mL 700  Cannabinoid, urine              Cutoff 50 ng/mL 800  Barbiturates, urine             Cutoff 200 ng/mL 900  Benzodiazepine, urine           Cutoff 200 ng/mL 1000 Methadone, urine                Cutoff 300 ng/mL 1100 1200 The urine drug screen provides only a preliminary, unconfirmed 1300 analytical test result and should not be used for non-medical 1400 purposes. Clinical consideration and professional judgment should 1500 be applied to any positive drug screen result due to possible 1600 interfering substances. A more specific alternate chemical method 1700 must be used in order to obtain a confirmed analytical result.  1800 Gas chromato graphy / mass spectrometry (GC/MS) is the preferred 1900 confirmatory method.   Ethanol     Status: None   Collection Time: 05/18/15  9:53 PM  Result Value Ref Range   Alcohol, Ethyl (B) <5 <5 mg/dL    Comment:        LOWEST DETECTABLE LIMIT FOR SERUM ALCOHOL  IS 5 mg/dL FOR MEDICAL PURPOSES ONLY   Comprehensive metabolic panel     Status: Abnormal   Collection Time: 05/18/15  9:53 PM  Result Value Ref Range   Sodium 140 135 - 145 mmol/L   Potassium 3.8 3.5 - 5.1 mmol/L   Chloride 102 101 - 111 mmol/L   CO2 29 22 - 32 mmol/L   Glucose, Bld 239 (H) 65 - 99 mg/dL   BUN 18 6 - 20 mg/dL   Creatinine, Ser 0.90 0.44 - 1.00 mg/dL   Calcium 9.5 8.9 - 10.3 mg/dL   Total Protein 7.4 6.5 - 8.1 g/dL   Albumin 4.0 3.5 - 5.0 g/dL   AST 15 15 - 41 U/L   ALT 14 14 - 54 U/L   Alkaline Phosphatase 48 38 - 126 U/L   Total Bilirubin 0.7 0.3 - 1.2 mg/dL   GFR calc non Af Amer >60 >60 mL/min   GFR calc Af Amer >60 >60 mL/min    Comment: (NOTE) The eGFR has been calculated using the CKD EPI equation. This calculation has not been validated in all clinical situations. eGFR's persistently <60 mL/min signify possible Chronic Kidney Disease.    Anion gap 9 5 - 15  APTT     Status: None   Collection Time: 05/18/15  9:53 PM  Result Value Ref Range   aPTT 28 24 - 36 seconds  CBC WITH DIFFERENTIAL     Status: Abnormal   Collection Time: 05/18/15  9:53 PM  Result Value Ref Range   WBC 8.9 3.6 - 11.0 K/uL   RBC 5.89 (H) 3.80 - 5.20 MIL/uL   Hemoglobin 11.8 (L) 12.0 - 16.0 g/dL   HCT 37.2 35.0 - 47.0 %   MCV 63.1 (L) 80.0 - 100.0 fL   MCH 20.0 (L) 26.0 - 34.0 pg   MCHC 31.7 (L) 32.0 - 36.0 g/dL   RDW 18.9 (H) 11.5 - 14.5 %   Platelets 194 150 - 440 K/uL    Comment: PLATELET COUNT CONFIRMED BY SMEAR   Neutrophils Relative % 63% %   Neutro Abs 5.6 1.4 - 6.5 K/uL   Lymphocytes Relative 23% %   Lymphs Abs 2.0 1.0 - 3.6 K/uL  Monocytes Relative 12% %   Monocytes Absolute 1.1 (H) 0.2 - 0.9 K/uL   Eosinophils Relative 1% %   Eosinophils Absolute 0.1 0 - 0.7 K/uL   Basophils Relative 1% %   Basophils Absolute 0.1 0 - 0.1 K/uL  Troponin I     Status: None   Collection Time: 05/18/15  9:53 PM  Result Value Ref Range   Troponin I <0.03 <0.031 ng/mL     Comment:        NO INDICATION OF MYOCARDIAL INJURY.   Protime-INR     Status: None   Collection Time: 05/18/15  9:53 PM  Result Value Ref Range   Prothrombin Time 12.8 11.4 - 15.0 seconds   INR 0.94   Glucose, capillary     Status: Abnormal   Collection Time: 05/19/15  1:29 AM  Result Value Ref Range   Glucose-Capillary 280 (H) 65 - 99 mg/dL  Glucose, capillary     Status: Abnormal   Collection Time: 05/19/15  4:33 AM  Result Value Ref Range   Glucose-Capillary 279 (H) 65 - 99 mg/dL   Comment 1 Notify RN   Glucose, capillary     Status: Abnormal   Collection Time: 05/19/15  7:18 AM  Result Value Ref Range   Glucose-Capillary 195 (H) 65 - 99 mg/dL  Glucose, capillary     Status: Abnormal   Collection Time: 05/19/15  1:33 PM  Result Value Ref Range   Glucose-Capillary 221 (H) 65 - 99 mg/dL  Glucose, capillary     Status: Abnormal   Collection Time: 05/19/15  4:12 PM  Result Value Ref Range   Glucose-Capillary 215 (H) 65 - 99 mg/dL    Ct Head Wo Contrast  05/18/2015   CLINICAL DATA:  Severe headache, left-sided weakness  EXAM: CT HEAD WITHOUT CONTRAST  TECHNIQUE: Contiguous axial images were obtained from the base of the skull through the vertex without intravenous contrast.  COMPARISON:  01/04/2015  FINDINGS: There is no evidence of mass effect, midline shift or extra-axial fluid collections. There is no evidence of a space-occupying lesion or intracranial hemorrhage. There is no evidence of a cortical-based area of acute infarction. There is an old right MCA territory infarct.  The ventricles and sulci are appropriate for the patient's age. The basal cisterns are patent.  Visualized portions of the orbits are unremarkable. The visualized portions of the paranasal sinuses and mastoid air cells are unremarkable.  The osseous structures are unremarkable.  IMPRESSION: 1. No acute intracranial pathology. 2. Old right MCA territory infarct.   Electronically Signed   By: Kathreen Devoid   On:  05/18/2015 21:38   Mr Brain Wo Contrast  05/19/2015   CLINICAL DATA:  49 year old female with acute onset severe headache and left side weakness 1 day. Initial encounter. Current history of diabetes, hypertension, and prior stroke.  EXAM: MRI HEAD WITHOUT CONTRAST  TECHNIQUE: Multiplanar, multiecho pulse sequences of the brain and surrounding structures were obtained without intravenous contrast.  COMPARISON:  Head CT 05/18/2015.  Brain MRI 12/27/2014  FINDINGS: Stable cerebral volume. Major intracranial vascular flow voids are stable, with chronic decreased distal right vertebral artery flow void.  No restricted diffusion or evidence of acute infarction. Chronic moderate to large right MCA territory infarct with encephalomalacia, laminar necrosis, and Wallerian degeneration. Interval expected evolution of the small dorsal right brainstem lacunar infarct which occurred in April, now only minimal gliosis at that site (series 7, image 12).  Elsewhere stable gray and white matter signal.  No midline shift, mass effect, evidence of mass lesion, ventriculomegaly, extra-axial collection or acute intracranial hemorrhage. Cervicomedullary junction and pituitary are within normal limits. Negative visualized cervical spine.  Visible internal auditory structures appear normal. Stable paranasal sinuses and mastoids. Stable dysconjugate gaze. Stable orbit and scalp soft tissues. Visualized bone marrow signal is within normal limits.  IMPRESSION: 1.  No acute intracranial abnormality. 2. Advanced chronic ischemic disease is stable since April.   Electronically Signed   By: Genevie Ann M.D.   On: 05/19/2015 13:25   US Carotid Bilateral  05/19/2015   CLINICAL DATA:  49 year old female with history of cerebral vascular accident and severe headache  EXAM: BILATERAL CAROTID DUPLEX ULTRASOUND  TECHNIQUE: Pearline Cables scale imaging, color Doppler and duplex ultrasound were performed of bilateral carotid and vertebral arteries in the neck.   COMPARISON:  Head CT 05/18/2015  FINDINGS: Criteria: Quantification of carotid stenosis is based on velocity parameters that correlate the residual internal carotid diameter with NASCET-based stenosis levels, using the diameter of the distal internal carotid lumen as the denominator for stenosis measurement.  The following velocity measurements were obtained:  RIGHT  ICA:  70/23 cm/sec  CCA:  473/40 cm/sec  SYSTOLIC ICA/CCA RATIO:  0.6  DIASTOLIC ICA/CCA RATIO:  1.9  ECA:  113 cm/sec  LEFT  ICA:  91/26 cm/sec  CCA:  37/09 cm/sec  SYSTOLIC ICA/CCA RATIO:  1.0  DIASTOLIC ICA/CCA RATIO:  1.6  ECA:  86 cm/sec  RIGHT CAROTID ARTERY: Heterogeneous atherosclerotic plaque beginning in the distal common carotid artery and extending into the proximal internal carotid artery. By peak systolic velocity criteria the estimated stenosis remains less than 50%.  RIGHT VERTEBRAL ARTERY:  Patent with normal antegrade flow.  LEFT CAROTID ARTERY: Focal heterogeneous atherosclerotic plaque in the mid common carotid artery results in a less than 50% narrowing. There is trace heterogeneous atherosclerotic plaque in the carotid bifurcation but no extension into the internal carotid artery. The internal carotid is tortuous which results in mildly increased velocities distally.  LEFT VERTEBRAL ARTERY:  Patent with normal antegrade flow.  IMPRESSION: 1. Mild (1-49%) stenosis proximal right internal carotid artery secondary to heterogeneous atherosclerotic plaque. 2. No evidence of stenosis in the proximal left internal carotid artery. 3. Less than 50% stenosis in the mid left common carotid artery secondary to circumferential but slightly eccentric heterogeneous atherosclerotic plaque. 4. Vertebral arteries are patent with normal antegrade flow.  Signed,  Criselda Peaches, MD  [facility]  [facility]   Electronically Signed   By: Jacqulynn Cadet M.D.   On: 05/19/2015 13:04    Review of  Systems  Constitutional: Negative.   HENT: Negative for congestion, ear discharge, ear pain, hearing loss, nosebleeds, sore throat and tinnitus.   Eyes: Positive for blurred vision and photophobia. Negative for double vision, pain, discharge and redness.  Respiratory: Negative.  Negative for stridor.   Cardiovascular: Negative.   Gastrointestinal: Negative.   Genitourinary: Negative.   Musculoskeletal: Positive for myalgias and neck pain. Negative for back pain, joint pain and falls.  Skin: Negative.   Neurological: Positive for dizziness, focal weakness and headaches. Negative for tingling, tremors, sensory change, speech change, seizures and loss of consciousness.   Blood pressure 150/83, pulse 96, temperature 98.9 F (37.2 C), temperature source Oral, resp. rate 16, height 5' 2"  (1.575 m), weight 112.583 kg (248 lb 3.2 oz), last menstrual period 04/13/2015, SpO2 100 %. Physical Exam  Nursing note and vitals reviewed. Constitutional: She appears well-developed  and well-nourished. No distress.  HENT:  Head: Normocephalic and atraumatic.  Right Ear: External ear normal.  Left Ear: External ear normal.  Nose: Nose normal.  Mouth/Throat: Oropharynx is clear and moist.  Eyes: Conjunctivae and EOM are normal. Pupils are equal, round, and reactive to light. No scleral icterus.  Neck: Normal range of motion. Neck supple.  Cardiovascular: Normal rate, regular rhythm, normal heart sounds and intact distal pulses.   No murmur heard. Respiratory: Effort normal and breath sounds normal. No respiratory distress.  GI: Soft. Bowel sounds are normal. She exhibits no distension.  Musculoskeletal: Normal range of motion. She exhibits no edema.  Neurological:  Alert and oriented x 3, nl speech and language PERRLA, EOMI with walled eye deformity, no obvious VF cut, mild L droop, tongue midline 3/5 L, 5/5 R FTN WNL on R L babinski Decreased sensation on L  Skin: Skin is warm and dry. She is not  diaphoretic.   MRI of brain personally reviewed by me and shows old R MCA infarct, no acute infarcts  Assessment/Plan: 1.  Probable partial seizure-  Pt has large stroke which puts her at higher risk for seizures and this would explain headache and increased weakness.  Could also be systemic illness that makes her old weakness worse.  This is not a stroke -  EEG in am -  Increase topamax to 57m BID PO -  Mg 5027m toradol 1560mreglan 5mg40md benadryl 12.5mg 88m IV for synergy -  One time dose of Depacon 750mg 39mow -  Will follow  Rosemaria Inabinet 05/19/2015, 10:23 PM

## 2015-05-19 NOTE — Plan of Care (Signed)
Problem: Discharge/Transitional Outcomes Goal: Other Discharge Outcomes/Goals Outcome: Progressing Plan of care progress to goal: Pt received Stroke education information. VSS.  Pt states she is legally blind. Pt to call for bathroom until we can observe her gait. NPO

## 2015-05-19 NOTE — Progress Notes (Signed)
*  PRELIMINARY RESULTS* Echocardiogram 2D Echocardiogram has been performed.  Georgann Housekeeper Hege 05/19/2015, 11:17 AM

## 2015-05-19 NOTE — Plan of Care (Signed)
Problem: Discharge/Transitional Outcomes Goal: Educational Plan Complete Individualization:  Pt prefers to be called [name] who lives at home with her husband and children.  Hx HTN, DM, Depression, Stroke, & Hyperlipdemia controlled by home medications High fall risk due to left sided weakness and limited vision. Bed alarm on. Hourly rounding. Pt understand how to use call system for assistance.

## 2015-05-19 NOTE — Evaluation (Signed)
Physical Therapy Evaluation Patient Details Name: [name] MRN: [id number] DOB: [birth year] Today's Date: 05/19/2015   History of Present Illness  presented to ER secondary to acute onset of severe HA, L LE weakness/heaviness; admitted for medical management and to rule out any acute neurological event.  Head CT negative for acute change.  Clinical Impression  Upon evaluation, patient alert and oriented, follows commands.  Demonstrates weakness of L > R hemi-body (at least 3-/5 with MMT), but isolated testing/performance rather inconsistent with functional use (at least 4 to 4+/5) throughout session.  Patient very labored and effortful with asked to move L extremities on command, but noted to easily use extremities to perform automatic tasks (assisting with bed mobility/repositioning, scratching nose).  Reports baseline sensory deficits throughout  L UE/LE, but reports paresthesia is "worse now" (still able to detect light touch and localize appropriately).  Currently able to complete bed mobility with mod indep; sit/stand, basic transfers and gait (12') with RW, mod assist +2 for safety.  Performance very labored and effortful, but no overt buckling with upright, WBing positions.  Feel patient performance very inconsistent; question impact of self-limiting behaviors. Patient with small episode of emesis after gait trial; RN informed/aware.  Vitals stable and WFL (BP 147/85, HR 85). Would benefit from skilled PT to address above deficits and promote optimal return to PLOF; recommend transition to STR upon discharge from acute hospitalization, as patient unable to demonstrate ability to safety negotiate entry/exit of home or mobilize household distances required for safe discharge home.  Will continue to assess.     Follow Up Recommendations SNF    Equipment Recommendations  Rolling walker with 5" wheels    Recommendations for Other Services       Precautions / Restrictions  Precautions Precautions: Fall Precaution Comments: NPO, legally blind Restrictions Weight Bearing Restrictions: No      Mobility  Bed Mobility Overal bed mobility: Modified Independent             General bed mobility comments: transition towards R; good integration of L UE/LE into movement transition  Transfers Overall transfer level: Needs assistance Equipment used: Rolling walker (2 wheeled) Transfers: Sit to/from Stand Sit to Stand: Mod assist;+2 physical assistance (for safety)         General transfer comment: assist for lift off, dynamic balance  Ambulation/Gait Ambulation/Gait assistance: Mod assist;+2 physical assistance Ambulation Distance (Feet): 12 Feet Assistive device: Rolling walker (2 wheeled)       General Gait Details: partial step through gait pattern with limited heel strike/toe off, decreased foot clearance.  Occasional assist for L knee control, but able to support body weight 90% gait cycle.  Maintains L UE grasp on RW without difficulty.  Stairs            Wheelchair Mobility    Modified Rankin (Stroke Patients Only)       Balance Overall balance assessment: Needs assistance Sitting-balance support: No upper extremity supported;Feet supported Sitting balance-Leahy Scale: Fair     Standing balance support: Bilateral upper extremity supported Standing balance-Leahy Scale: Fair                               Pertinent Vitals/Pain Pain Assessment: Faces Faces Pain Scale: Hurts even more Pain Location: headache, toothache Pain Descriptors / Indicators: Aching Pain Intervention(s): Limited activity within patient's tolerance;Monitored during session;Repositioned    Home Living Family/patient expects to be discharged to:: Private residence Living Arrangements:  Spouse/significant other;Children Available Help at Discharge: Family (husband works 3rd shift outside of home) Type of Home: House Home Access: Stairs to  enter   [employer] of Steps: 4 Home Layout: Two level;Able to live on main level with bedroom/bathroom        Prior Function Level of Independence: Needs assistance         Comments: Ambulatory for household distances without assist device, assist from husband with stairs.  Uses sight cane for community distances.     Hand Dominance        Extremity/Trunk Assessment   Upper Extremity Assessment:  (R UE grossly at least 4/5, no sensory deficit.  L UE at least 3-/5 with isolated testing, 4/5 with functional activities.  Reports L UE paresthesia (chronic), but worse with this event.  L UE use very inconsistent)           Lower Extremity Assessment:  (R LE strength at least 4-/5 throughout, no sensory deficit.  L LE grossly at least 3-/5, labored activation (appears give-way).  Reports L LE paresthesia (chronic), but worse with this event.  L LE use very inconsistent)         Communication   Communication: No difficulties  Cognition Arousal/Alertness: Awake/alert Behavior During Therapy: WFL for tasks assessed/performed Overall Cognitive Status: Within Functional Limits for tasks assessed                      General Comments      Exercises Other Exercises Other Exercises: Unsupported sitting balance, sup--increased sway in all directions, but able to self-correct when cued. Other Exercises: Sit/stand with RW, mod assist +1-2 for safety.  Poor task initiation, but once upright, able to support self without buckling. Other Exercises: Bed/chair transfer with RW, min/mod assist +2 for safety. Short, shuffling steps. Very labored, effortful performance.      Assessment/Plan    PT Assessment Patient needs continued PT services  PT Diagnosis Difficulty walking;Generalized weakness   PT Problem List Decreased strength;Decreased range of motion;Decreased activity tolerance;Decreased balance;Decreased mobility;Decreased coordination;Decreased knowledge  of use of DME;Decreased safety awareness;Decreased knowledge of precautions;Obesity  PT Treatment Interventions DME instruction;Gait training;Stair training;Functional mobility training;Therapeutic activities;Therapeutic exercise;Balance training;Patient/family education   PT Goals (Current goals can be found in the Care Plan section) Acute Rehab PT Goals Patient Stated Goal: "to figure out what's going on" PT Goal Formulation: With patient Time For Goal Achievement: 06/02/15 Potential to Achieve Goals: Good    Frequency 7X/week (if MRI negative for acute neurological event, will transition to 2-6x/week)   Barriers to discharge Decreased caregiver support;Inaccessible home environment      Co-evaluation               End of Session Equipment Utilized During Treatment: Gait belt Activity Tolerance: Patient tolerated treatment well Patient left: in chair;with call bell/phone within reach;with chair alarm set Nurse Communication: Mobility status         Time: 4098-1191 PT Time Calculation (min) (ACUTE ONLY): 42 min   Charges:   PT Evaluation $Initial PT Evaluation Tier I: 1 Procedure PT Treatments $Therapeutic Activity: 8-22 mins   PT G Codes:       Terrian Ridlon H. Manson Passey, PT, DPT, NCS 05/19/2015, 1:22 PM [phone]

## 2015-05-19 NOTE — H&P (Signed)
[facility] - [place] at [facility]   PATIENT NAME: [name]    MR#:  [id number]  DATE OF BIRTH:  05/29/1966  DATE OF ADMISSION:  05/18/2015  PRIMARY CARE PHYSICIAN: PROVIDER NOT IN SYSTEM Dr. Dario Guardian  REQUESTING/REFERRING PHYSICIAN: Dr. Orvil Feil  CHIEF COMPLAINT:   Chief Complaint  Patient presents with  . Headache   heaviness of the left-sided for body  HISTORY OF PRESENT ILLNESS:  [name]  is a 49 y.o. female with a known history of hypertension, diabetes mellitus type 2, hyperlipidemia, history of CVA, depression, seizure disorder presents to the emergency room with the complaints of acute onset of severe headache with associated heaviness of the left side of the body of one day duration. Denies any fever, chills, shortness of breath, chest pain, nausea, vomiting, diarrhea, bowel or bladder disturbances. No associated speech or swallow difficulties. Patient is somewhat vague in presenting complaints and states at times she has  weakness of both upper extremities. Evaluation in the ED revealed stable vital signs and by the ED physician's note, patient was noted to have subtle decreased strength in both upper extremities as well as left lower extremities. Lab work was unremarkable except for elevated blood sugar of 239. CT of the head negative for acute infarct but positive for old right MCA territory infarction. EKG normal sinus rhythm with ventricular rate of 83 bpm, LVH, nonspecific T-wave abnormality. Patient received some IV pain medications following which her headache is under control. In view of prior history of CVA and symptoms concerning for a possible new stroke, hospitalists service was consulted for further management. Patient at the current time is comfortably resting in the right and states her headache and left-sided heaviness has improved significantly.  PAST MEDICAL HISTORY:   Past Medical History  Diagnosis Date  . HTN (hypertension)   .  Diabetes   . Hyperlipidemia   . Depression   . Stroke     PAST SURGICAL HISTORY:   Past Surgical History  Procedure Laterality Date  . Cesarean section      SOCIAL HISTORY:   Social History  Substance Use Topics  . Smoking status: Never Smoker   . Smokeless tobacco: Not on file  . Alcohol Use: No    FAMILY HISTORY:   Family History  Problem Relation Age of Onset  . Hypertension Mother   . Hypertension Father     DRUG ALLERGIES:  No Known Allergies  REVIEW OF SYSTEMS:   Review of Systems  Constitutional: Negative for fever, chills and malaise/fatigue.  HENT: Negative for ear pain, hearing loss, nosebleeds, sore throat and tinnitus.   Eyes: Negative for blurred vision, double vision, pain, discharge and redness.  Respiratory: Negative for cough, hemoptysis, sputum production, shortness of breath and wheezing.   Cardiovascular: Negative for chest pain, palpitations, orthopnea and leg swelling.  Gastrointestinal: Negative for nausea, vomiting, abdominal pain, diarrhea, constipation, blood in stool and melena.  Genitourinary: Negative for dysuria, urgency, frequency and hematuria.  Musculoskeletal: Negative for back pain, joint pain and neck pain.  Skin: Negative for itching and rash.  Neurological: Negative for dizziness, tingling, sensory change and seizures.       Headache and heaviness of left-sided of the body as noted in history of present illness.  Endo/Heme/Allergies: Does not bruise/bleed easily.  Psychiatric/Behavioral: Positive for depression. The patient is not nervous/anxious.     MEDICATIONS AT HOME:   Prior to Admission medications   Medication Sig Start Date End Date Taking? Authorizing  Provider  aspirin EC 325 MG EC tablet Take 1 tablet (325 mg total) by mouth daily. 03/19/14  Yes Layne Benton, NP  glipiZIDE (GLUCOTROL XL) 5 MG 24 hr tablet Take 5 mg by mouth daily.   Yes Historical Provider, MD  Iron TABS Take 1 tablet by mouth daily. Strength  unknown. otc.   Yes Historical Provider, MD  lisinopril-hydrochlorothiazide (PRINZIDE,ZESTORETIC) 20-12.5 MG per tablet Take 1 tablet by mouth daily. 04/19/15  Yes Jene Every, MD  metFORMIN (GLUCOPHAGE) 1000 MG tablet Take 1,000 mg by mouth 2 (two) times daily with a meal.   Yes Historical Provider, MD  PARoxetine (PAXIL) 20 MG tablet Take 20 mg by mouth daily.   Yes Historical Provider, MD  rosuvastatin (CRESTOR) 10 MG tablet Take 10 mg by mouth daily.   Yes Historical Provider, MD  topiramate (TOPAMAX) 50 MG tablet Take 1 tablet (50 mg total) by mouth daily. 03/19/14  Yes Layne Benton, NP      VITAL SIGNS:  Blood pressure 122/72, pulse 81, temperature 98.3 F (36.8 C), temperature source Oral, resp. rate 18, height  (1.575 m), weight 102.967 kg (227 lb), last menstrual period 04/13/2015, SpO2 98 %.  PHYSICAL EXAMINATION:  Physical Exam  Constitutional: She is oriented to person, place, and time. She appears well-developed and well-nourished. No distress.  HENT:  Head: Normocephalic and atraumatic.  Right Ear: External ear normal.  Left Ear: External ear normal.  Nose: Nose normal.  Mouth/Throat: Oropharynx is clear and moist. No oropharyngeal exudate.  Eyes: EOM are normal. Pupils are equal, round, and reactive to light. No scleral icterus.  Neck: Normal range of motion. Neck supple. No JVD present. No thyromegaly present.  Cardiovascular: Normal rate, regular rhythm, normal heart sounds and intact distal pulses.  Exam reveals no friction rub.   No murmur heard. Respiratory: Effort normal and breath sounds normal. No respiratory distress. She has no wheezes. She has no rales. She exhibits no tenderness.  GI: Soft. Bowel sounds are normal. She exhibits no distension and no mass. There is no tenderness. There is no rebound and no guarding.  Musculoskeletal: Normal range of motion. She exhibits no edema.  Lymphadenopathy:    She has no cervical adenopathy.  Neurological: She is  alert and oriented to person, place, and time. She has normal reflexes. She displays normal reflexes. No cranial nerve deficit. She exhibits normal muscle tone.  Power in both upper extremities 4-5/5. Power in left lower extremity 3-4/5  Skin: Skin is warm. No rash noted. No erythema.  Psychiatric: She has a normal mood and affect. Her behavior is normal. Thought content normal.   LABORATORY PANEL:   CBC  Recent Labs Lab 05/18/15 2153  WBC 8.9  HGB 11.8*  HCT 37.2  PLT 194   ------------------------------------------------------------------------------------------------------------------  Chemistries   Recent Labs Lab 05/18/15 2153  NA 140  K 3.8  CL 102  CO2 29  GLUCOSE 239*  BUN 18  CREATININE 0.90  CALCIUM 9.5  AST 15  ALT 14  ALKPHOS 48  BILITOT 0.7   ------------------------------------------------------------------------------------------------------------------  Cardiac Enzymes  Recent Labs Lab 05/18/15 2153  TROPONINI <0.03   ------------------------------------------------------------------------------------------------------------------  RADIOLOGY:  Ct Head Wo Contrast  05/18/2015   CLINICAL DATA:  Severe headache, left-sided weakness  EXAM: CT HEAD WITHOUT CONTRAST  TECHNIQUE: Contiguous axial images were obtained from the base of the skull through the vertex without intravenous contrast.  COMPARISON:  01/04/2015  FINDINGS: There is no evidence of mass effect, midline  shift or extra-axial fluid collections. There is no evidence of a space-occupying lesion or intracranial hemorrhage. There is no evidence of a cortical-based area of acute infarction. There is an old right MCA territory infarct.  The ventricles and sulci are appropriate for the patient's age. The basal cisterns are patent.  Visualized portions of the orbits are unremarkable. The visualized portions of the paranasal sinuses and mastoid air cells are unremarkable.  The osseous structures are  unremarkable.  IMPRESSION: 1. No acute intracranial pathology. 2. Old right MCA territory infarct.   Electronically Signed   By: Elige Ko   On: 05/18/2015 21:38    EKG:   Orders placed or performed during the hospital encounter of 05/18/15  . ED EKG  . ED EKG  . EKG 12-Lead  . EKG 12-Lead  Normal sinus rhythm with ventricular rate of 83 bpm, LVH, nonspecific T-wave abnormality  IMPRESSION AND PLAN:   49 year old female with history of multiple medical problems including prior CVA, hypertension, diabetes mellitus type 2, depression presents to the emergency room with the complaints of acute onset of severe headache with associated heaviness on the left side of the body. 1. Headache, neck exam unremarkable. CT head negative for acute intracranial pathology. 2. Left-sided heaviness/weakness, history of prior CVA. Rule out new stroke. 3. History of prior CVA. Plan: Admit, neuro watch, continue aspirin. Ordered MRI brain, echo, bilateral carotid Dopplers and neuro consultation requested for further advice. IV pain control medications when necessary for headache. 4. Diabetes mellitus type 2, stable on home medications. Continue same, follow-up blood sugars. 5. Hyperlipidemia, stable on statin. Continue same. 6. Depression, stable on home medications. Continue same. 7. History of seizure disorder, no acute problems. Continue home medications, monitor, seizure precautions.    All the records are reviewed and case discussed with ED provider. Management plans discussed with the patient, family and they are in agreement.  CODE STATUS: Full code  TOTAL TIME TAKING CARE OF THIS PATIENT: 50 minutes.    Jonnie Kind N M.D on 05/19/2015 at 12:45 AM  Between 7am to 6pm - Pager - [phone]  After 6pm go to [email or web address] - password [email or web address] [facility]  [facility]  Office  [phone]  CC: Primary care physician; PROVIDER NOT IN SYSTEM

## 2015-05-19 NOTE — Progress Notes (Signed)
Tried 5 unsuccessful IV starts to give Depacon and Magnesium. Called Dr. Katrinka Blazing who said he wanted the meds given IV and to insert a PICC line. Called Dr. Elpidio Anis and explained the situation. He determined that we could give the Depokote po and not give the Magnesium and have the patient discharged after this medication was given.

## 2015-05-20 NOTE — Discharge Summary (Signed)
[facility] - [place] at [facility]   PATIENT NAME: [name]    MR#:  [id number]  DATE OF BIRTH:  1966-01-08  DATE OF ADMISSION:  05/18/2015 ADMITTING PHYSICIAN: Crissie Figures, MD  DATE OF DISCHARGE: 05/19/2015  5:41 PM  PRIMARY CARE PHYSICIAN: PROVIDER NOT IN SYSTEM    ADMISSION DIAGNOSIS:  Left-sided weakness [M62.89] Cerebral infarction due to unspecified mechanism [I63.9] Acute intractable headache, unspecified headache type [R51]  DISCHARGE DIAGNOSIS:  Principal Problem:   Headache Active Problems:   HTN (hypertension)   Diabetes   Weakness of left side of body   H/O: CVA (cerebrovascular accident)   SECONDARY DIAGNOSIS:   Past Medical History  Diagnosis Date  . HTN (hypertension)   . Diabetes   . Hyperlipidemia   . Depression   . Stroke      ADMITTING HISTORY  [name] is a 49 y.o. female with a known history of hypertension, diabetes mellitus type 2, hyperlipidemia, history of CVA, depression, seizure disorder presents to the emergency room with the complaints of acute onset of severe headache with associated heaviness of the left side of the body of one day duration. Denies any fever, chills, shortness of breath, chest pain, nausea, vomiting, diarrhea, bowel or bladder disturbances. No associated speech or swallow difficulties. Patient is somewhat vague in presenting complaints and states at times she has weakness of both upper extremities. Evaluation in the ED revealed stable vital signs and by the ED physician's note, patient was noted to have subtle decreased strength in both upper extremities as well as left lower extremities. Lab work was unremarkable except for elevated blood sugar of 239. CT of the head negative for acute infarct but positive for old right MCA territory infarction. EKG normal sinus rhythm with ventricular rate of 83 bpm, LVH, nonspecific T-wave abnormality. Patient received some IV pain medications following  which her headache is under control. In view of prior history of CVA and symptoms concerning for a possible new stroke, hospitalists service was consulted for further management. Patient at the current time is comfortably resting in the right and states her headache and left-sided heaviness has improved significantly.   HOSPITAL COURSE:   Patient was admitted onto medical floor with telemetry. MRI of the brain was done which showed old strokes but nothing acute. She continued to have weakness which seems chronic. Seen by neurology Dr. Alverda Skeans and thought to be possibility of a partial seizure. Depakote 1 time dose was given. Recommendations were to increase Topamax to 50 twice a day. On further discussing with the patient she mentions that she gets headaches and significant dizziness with Topamax which made her stop the medication in the past. Patient started on Depakote twice a day dosing and discharged home to follow-up with neurology as outpatient. Patient's symptoms could also be due to migraines. Depakote should help with preventing further migraine attacks.  Stable for discharge home.   CONSULTS OBTAINED:  Treatment Team:  Mellody Drown, MD  DRUG ALLERGIES:  No Known Allergies  DISCHARGE MEDICATIONS:   Discharge Medication List as of 05/19/2015  3:06 PM    START taking these medications   Details  valproic acid (DEPAKENE) 250 MG capsule Take 1 capsule (250 mg total) by mouth 2 (two) times daily., Starting 05/19/2015, Until Discontinued, Print      CONTINUE these medications which have NOT CHANGED   Details  aspirin EC 325 MG EC tablet Take 1 tablet (325 mg total) by mouth daily., Starting  03/19/2014, Until Discontinued, Normal    glipiZIDE (GLUCOTROL XL) 5 MG 24 hr tablet Take 5 mg by mouth daily., Until Discontinued, Historical Med    Iron TABS Take 1 tablet by mouth daily. Strength unknown. otc., Until Discontinued, Historical Med    lisinopril-hydrochlorothiazide  (PRINZIDE,ZESTORETIC) 20-12.5 MG per tablet Take 1 tablet by mouth daily., Starting 04/19/2015, Until Discontinued, Print    metFORMIN (GLUCOPHAGE) 1000 MG tablet Take 1,000 mg by mouth 2 (two) times daily with a meal., Until Discontinued, Historical Med    PARoxetine (PAXIL) 20 MG tablet Take 20 mg by mouth daily., Until Discontinued, Historical Med    rosuvastatin (CRESTOR) 10 MG tablet Take 10 mg by mouth daily., Until Discontinued, Historical Med      STOP taking these medications     topiramate (TOPAMAX) 50 MG tablet          Today    VITAL SIGNS:  Blood pressure 150/83, pulse 96, temperature 98.9 F (37.2 C), temperature source Oral, resp. rate 16, height 5\' 2"  (1.575 m), weight 112.583 kg (248 lb 3.2 oz), last menstrual period 04/13/2015, SpO2 100 %.  I/O:  No intake or output data in the 24 hours ending 05/20/15 1237  PHYSICAL EXAMINATION:  Physical Exam  GENERAL:  49 y.o.-year-old patient lying in the bed with no acute distress.  LUNGS: Normal breath sounds bilaterally, no wheezing, rales,rhonchi or crepitation. No use of accessory muscles of respiration.  CARDIOVASCULAR: S1, S2 normal. No murmurs, rubs, or gallops.  ABDOMEN: Soft, non-tender, non-distended. Bowel sounds present. No organomegaly or mass.  NEUROLOGIC: Moves all 4 extremities. PSYCHIATRIC: The patient is alert and oriented x 3.  SKIN: No obvious rash, lesion, or ulcer.   DATA REVIEW:   CBC  Recent Labs Lab 05/18/15 2153  WBC 8.9  HGB 11.8*  HCT 37.2  PLT 194    Chemistries   Recent Labs Lab 05/18/15 2153  NA 140  K 3.8  CL 102  CO2 29  GLUCOSE 239*  BUN 18  CREATININE 0.90  CALCIUM 9.5  AST 15  ALT 14  ALKPHOS 48  BILITOT 0.7    Cardiac Enzymes  Recent Labs Lab 05/18/15 2153  TROPONINI <0.03    Microbiology Results  Results for orders placed or performed during the hospital encounter of 03/17/14  MRSA PCR Screening     Status: None   Collection Time: 03/17/14  2:05  PM  Result Value Ref Range Status   MRSA by PCR NEGATIVE NEGATIVE Final    Comment:        The GeneXpert MRSA Assay (FDA approved for NASAL specimens only), is one component of a comprehensive MRSA colonization surveillance program. It is not intended to diagnose MRSA infection nor to guide or monitor treatment for MRSA infections.    RADIOLOGY:  Ct Head Wo Contrast  05/18/2015   CLINICAL DATA:  Severe headache, left-sided weakness  EXAM: CT HEAD WITHOUT CONTRAST  TECHNIQUE: Contiguous axial images were obtained from the base of the skull through the vertex without intravenous contrast.  COMPARISON:  01/04/2015  FINDINGS: There is no evidence of mass effect, midline shift or extra-axial fluid collections. There is no evidence of a space-occupying lesion or intracranial hemorrhage. There is no evidence of a cortical-based area of acute infarction. There is an old right MCA territory infarct.  The ventricles and sulci are appropriate for the patient's age. The basal cisterns are patent.  Visualized portions of the orbits are unremarkable. The visualized portions of the paranasal  sinuses and mastoid air cells are unremarkable.  The osseous structures are unremarkable.  IMPRESSION: 1. No acute intracranial pathology. 2. Old right MCA territory infarct.   Electronically Signed   By: Elige Ko   On: 05/18/2015 21:38   Mr Brain Wo Contrast  05/19/2015   CLINICAL DATA:  49 year old female with acute onset severe headache and left side weakness 1 day. Initial encounter. Current history of diabetes, hypertension, and prior stroke.  EXAM: MRI HEAD WITHOUT CONTRAST  TECHNIQUE: Multiplanar, multiecho pulse sequences of the brain and surrounding structures were obtained without intravenous contrast.  COMPARISON:  Head CT 05/18/2015.  Brain MRI 12/27/2014  FINDINGS: Stable cerebral volume. Major intracranial vascular flow voids are stable, with chronic decreased distal right vertebral artery flow void.  No  restricted diffusion or evidence of acute infarction. Chronic moderate to large right MCA territory infarct with encephalomalacia, laminar necrosis, and Wallerian degeneration. Interval expected evolution of the small dorsal right brainstem lacunar infarct which occurred in April, now only minimal gliosis at that site (series 7, image 12).  Elsewhere stable gray and white matter signal. No midline shift, mass effect, evidence of mass lesion, ventriculomegaly, extra-axial collection or acute intracranial hemorrhage. Cervicomedullary junction and pituitary are within normal limits. Negative visualized cervical spine.  Visible internal auditory structures appear normal. Stable paranasal sinuses and mastoids. Stable dysconjugate gaze. Stable orbit and scalp soft tissues. Visualized bone marrow signal is within normal limits.  IMPRESSION: 1.  No acute intracranial abnormality. 2. Advanced chronic ischemic disease is stable since April.   Electronically Signed   By: Odessa Fleming M.D.   On: 05/19/2015 13:25   US Carotid Bilateral  05/19/2015   CLINICAL DATA:  49 year old female with history of cerebral vascular accident and severe headache  EXAM: BILATERAL CAROTID DUPLEX ULTRASOUND  TECHNIQUE: Wallace Cullens scale imaging, color Doppler and duplex ultrasound were performed of bilateral carotid and vertebral arteries in the neck.  COMPARISON:  Head CT 05/18/2015  FINDINGS: Criteria: Quantification of carotid stenosis is based on velocity parameters that correlate the residual internal carotid diameter with NASCET-based stenosis levels, using the diameter of the distal internal carotid lumen as the denominator for stenosis measurement.  The following velocity measurements were obtained:  RIGHT  ICA:  70/23 cm/sec  CCA:  118/12 cm/sec  SYSTOLIC ICA/CCA RATIO:  0.6  DIASTOLIC ICA/CCA RATIO:  1.9  ECA:  113 cm/sec  LEFT  ICA:  91/26 cm/sec  CCA:  91/16 cm/sec  SYSTOLIC ICA/CCA RATIO:  1.0  DIASTOLIC ICA/CCA RATIO:  1.6  ECA:  86 cm/sec   RIGHT CAROTID ARTERY: Heterogeneous atherosclerotic plaque beginning in the distal common carotid artery and extending into the proximal internal carotid artery. By peak systolic velocity criteria the estimated stenosis remains less than 50%.  RIGHT VERTEBRAL ARTERY:  Patent with normal antegrade flow.  LEFT CAROTID ARTERY: Focal heterogeneous atherosclerotic plaque in the mid common carotid artery results in a less than 50% narrowing. There is trace heterogeneous atherosclerotic plaque in the carotid bifurcation but no extension into the internal carotid artery. The internal carotid is tortuous which results in mildly increased velocities distally.  LEFT VERTEBRAL ARTERY:  Patent with normal antegrade flow.  IMPRESSION: 1. Mild (1-49%) stenosis proximal right internal carotid artery secondary to heterogeneous atherosclerotic plaque. 2. No evidence of stenosis in the proximal left internal carotid artery. 3. Less than 50% stenosis in the mid left common carotid artery secondary to circumferential but slightly eccentric heterogeneous atherosclerotic plaque. 4. Vertebral arteries are patent with  normal antegrade flow.  Signed,  Sterling Big, MD  [facility]  [facility]   Electronically Signed   By: Malachy Moan M.D.   On: 05/19/2015 13:04      Follow up with PCP in 1 week.  Management plans discussed with the patient, family and they are in agreement.  CODE STATUS:   TOTAL TIME TAKING CARE OF THIS PATIENT ON DAY OF DISCHARGE: more than 30 minutes.    Milagros Loll R M.D on 05/20/2015 at 12:37 PM  Between 7am to 6pm - Pager - [phone]  After 6pm go to [email or web address] - password [email or web address] [facility]  [place] [facility]  Office  [phone]  CC: Primary care physician; PROVIDER NOT IN SYSTEM

## 2015-06-06 ENCOUNTER — Encounter: Payer: Self-pay | Admitting: Emergency Medicine

## 2015-06-06 DIAGNOSIS — E119 Type 2 diabetes mellitus without complications: Secondary | ICD-10-CM | POA: Insufficient documentation

## 2015-06-06 DIAGNOSIS — R51 Headache: Secondary | ICD-10-CM | POA: Insufficient documentation

## 2015-06-06 DIAGNOSIS — R531 Weakness: Secondary | ICD-10-CM | POA: Diagnosis not present

## 2015-06-06 DIAGNOSIS — I10 Essential (primary) hypertension: Secondary | ICD-10-CM | POA: Diagnosis not present

## 2015-06-06 DIAGNOSIS — H538 Other visual disturbances: Secondary | ICD-10-CM | POA: Insufficient documentation

## 2015-06-06 DIAGNOSIS — Z79899 Other long term (current) drug therapy: Secondary | ICD-10-CM | POA: Diagnosis not present

## 2015-06-06 DIAGNOSIS — R110 Nausea: Secondary | ICD-10-CM | POA: Insufficient documentation

## 2015-06-06 DIAGNOSIS — Z7982 Long term (current) use of aspirin: Secondary | ICD-10-CM | POA: Diagnosis not present

## 2015-06-06 DIAGNOSIS — R200 Anesthesia of skin: Secondary | ICD-10-CM | POA: Diagnosis not present

## 2015-06-06 DIAGNOSIS — R29810 Facial weakness: Secondary | ICD-10-CM | POA: Diagnosis not present

## 2015-06-06 NOTE — ED Notes (Addendum)
Pt to triage via w/c with no distress noted; pt reports since yesterday having left sided HA accomp by nausea; st hx migraines; pt A&Ox3, MAEW, PERRL, speech clear

## 2015-06-07 ENCOUNTER — Emergency Department: Payer: BLUE CROSS/BLUE SHIELD

## 2015-06-07 ENCOUNTER — Emergency Department
Admission: EM | Admit: 2015-06-07 | Discharge: 2015-06-07 | Disposition: A | Discharge status: Home / Self Care | Payer: BLUE CROSS/BLUE SHIELD | Attending: Emergency Medicine | Admitting: Emergency Medicine

## 2015-06-07 DIAGNOSIS — R519 Headache, unspecified: Secondary | ICD-10-CM

## 2015-06-07 DIAGNOSIS — R51 Headache: Secondary | ICD-10-CM

## 2015-06-07 LAB — CBC
HCT: 34.8 % — ABNORMAL LOW (ref 35.0–47.0)
Hemoglobin: 10.9 g/dL — ABNORMAL LOW (ref 12.0–16.0)
MCH: 20 pg — ABNORMAL LOW (ref 26.0–34.0)
MCHC: 31.4 g/dL — ABNORMAL LOW (ref 32.0–36.0)
MCV: 63.9 fL — AB (ref 80.0–100.0)
PLATELETS: 177 10*3/uL (ref 150–440)
RBC: 5.44 MIL/uL — AB (ref 3.80–5.20)
RDW: 18.9 % — ABNORMAL HIGH (ref 11.5–14.5)
WBC: 9.5 10*3/uL (ref 3.6–11.0)

## 2015-06-07 LAB — COMPREHENSIVE METABOLIC PANEL
ALT: 14 U/L (ref 14–54)
AST: 15 U/L (ref 15–41)
Albumin: 3.8 g/dL (ref 3.5–5.0)
Alkaline Phosphatase: 62 U/L (ref 38–126)
Anion gap: 7 (ref 5–15)
BUN: 14 mg/dL (ref 6–20)
CHLORIDE: 102 mmol/L (ref 101–111)
CO2: 28 mmol/L (ref 22–32)
CREATININE: 0.81 mg/dL (ref 0.44–1.00)
Calcium: 8.8 mg/dL — ABNORMAL LOW (ref 8.9–10.3)
GFR calc Af Amer: >60 mL/min (ref >60)
GLUCOSE: 229 mg/dL — AB (ref 65–99)
Potassium: 3.5 mmol/L (ref 3.5–5.1)
SODIUM: 137 mmol/L (ref 135–145)
Total Bilirubin: 0.7 mg/dL (ref 0.3–1.2)
Total Protein: 7 g/dL (ref 6.5–8.1)

## 2015-06-07 LAB — TROPONIN I: Troponin I: <0.03 ng/mL (ref <0.031)

## 2015-06-07 MED ORDER — KETOROLAC TROMETHAMINE 30 MG/ML IJ SOLN
30.0000 mg | Freq: Once | INTRAMUSCULAR | Status: AC
  Administered 2015-06-07: 30 mg via INTRAVENOUS
  Filled 2015-06-07: qty 1

## 2015-06-07 MED ORDER — BUTALBITAL-APAP-CAFFEINE 50-325-40 MG PO TABS: 1.0000 | ORAL_TABLET | Freq: Four times a day (QID) | ORAL | Status: DC | PRN

## 2015-06-07 MED ORDER — SODIUM CHLORIDE 0.9 % IV BOLUS (SEPSIS)
1000.0000 mL | Freq: Once | INTRAVENOUS | Status: AC
  Administered 2015-06-07: 1000 mL via INTRAVENOUS

## 2015-06-07 MED ORDER — METOCLOPRAMIDE HCL 5 MG/ML IJ SOLN
10.0000 mg | Freq: Once | INTRAMUSCULAR | Status: AC
  Administered 2015-06-07: 10 mg via INTRAVENOUS
  Filled 2015-06-07: qty 2

## 2015-06-07 MED ORDER — DIPHENHYDRAMINE HCL 50 MG/ML IJ SOLN
25.0000 mg | Freq: Once | INTRAMUSCULAR | Status: AC
  Administered 2015-06-07: 25 mg via INTRAVENOUS
  Filled 2015-06-07: qty 1

## 2015-06-07 NOTE — ED Notes (Signed)
Pt noted sleeping comfortably.  

## 2015-06-07 NOTE — ED Notes (Signed)
Pt noted resting comfortably.  

## 2015-06-07 NOTE — ED Notes (Signed)
Patient with no complaints at this time. Respirations even and unlabored. Skin warm/dry. Discharge instructions reviewed with patient at this time. Patient given opportunity to voice concerns/ask questions. IV removed per policy and band-aid applied to site. Patient discharged at this time and left Emergency Department, via wheelchair.   

## 2015-06-07 NOTE — ED Provider Notes (Signed)
[facility] Emergency Department Provider Note  ____________________________________________  Time seen: Approximately 210 AM  I have reviewed the triage vital signs and the nursing notes.   HISTORY  Chief Complaint Headache    HPI [name] is a 49 y.o. female who comes into the ED with a massive headache. Patient reports that her headache started yesterday. The patient took 4 800 mg ibuprofen and Percocet reports that neither helped her pain. The patient does not have a history of migraines but has had headaches since her stroke in 2014. The patient reports typically takes ibuprofen and the pain goes away. The patient reports the pain as a 10 out of 10 in intensity. She reports that throbbing and it does feel similar to her previous headaches. The patient has some mild blurred vision is light sensitive and dizzy. She denies any chest pain or shortness of breath. The patient reports she is unable to tolerate the pain anymore so she decided to come in for further evaluation.   Past Medical History  Diagnosis Date  . HTN (hypertension)   . Diabetes   . Hyperlipidemia   . Depression   . Stroke     Patient Active Problem List   Diagnosis Date Noted  . Headache 05/19/2015  . Weakness of left side of body 05/19/2015  . H/O: CVA (cerebrovascular accident) 05/19/2015  . Headache(784.0) 03/19/2014  . Severe obesity (BMI >= 40) 03/19/2014  . HTN (hypertension)   . Diabetes   . Hyperlipidemia   . [name] (transient ischemic attack) 03/17/2014    Past Surgical History  Procedure Laterality Date  . Cesarean section      Current Outpatient Rx  Name  Route  Sig  Dispense  Refill  . aspirin EC 325 MG EC tablet   Oral   Take 1 tablet (325 mg total) by mouth daily.   30 tablet   0   . butalbital-acetaminophen-caffeine (FIORICET) 50-325-40 MG per tablet   Oral   Take 1-2 tablets by mouth every 6 (six) hours as needed for headache.   20 tablet   0   .  glipiZIDE (GLUCOTROL XL) 5 MG 24 hr tablet   Oral   Take 5 mg by mouth daily.         . Iron TABS   Oral   Take 1 tablet by mouth daily. Strength unknown. otc.         . lisinopril-hydrochlorothiazide (PRINZIDE,ZESTORETIC) 20-12.5 MG per tablet   Oral   Take 1 tablet by mouth daily.   30 tablet   1   . metFORMIN (GLUCOPHAGE) 1000 MG tablet   Oral   Take 1,000 mg by mouth 2 (two) times daily with a meal.         . PARoxetine (PAXIL) 20 MG tablet   Oral   Take 20 mg by mouth daily.         . rosuvastatin (CRESTOR) 10 MG tablet   Oral   Take 10 mg by mouth daily.         Marland Kitchen valproic acid (DEPAKENE) 250 MG capsule   Oral   Take 1 capsule (250 mg total) by mouth 2 (two) times daily.   60 capsule   0     Allergies Review of patient's allergies indicates no known allergies.  Family History  Problem Relation Age of Onset  . Hypertension Mother   . Hypertension Father     Social History Social History  Substance Use Topics  .  Smoking status: Never Smoker   . Smokeless tobacco: None  . Alcohol Use: No    Review of Systems Constitutional: No fever/chills Eyes: Blurred vision ENT: No sore throat. Cardiovascular: Denies chest pain. Respiratory: Denies shortness of breath. Gastrointestinal: Nausea with No abdominal pain.   no vomiting.  No diarrhea.  No constipation. Genitourinary: Negative for dysuria. Musculoskeletal: Negative for back pain. Skin: Negative for rash. Neurological: Headache with left facial numbness  10-point ROS otherwise negative.  ____________________________________________   PHYSICAL EXAM:  VITAL SIGNS: ED Triage Vitals  Enc Vitals Group     BP 06/06/15 2259 172/91 mmHg     Pulse Rate 06/06/15 2259 87     Resp --      Temp 06/06/15 2259 98.2 F (36.8 C)     Temp src --      SpO2 06/06/15 2259 100 %     Weight 06/06/15 2259 230 lb (104.327 kg)     Height 06/06/15 2259  (1.575 m)     Head Cir --      Peak Flow --       Pain Score 06/06/15 2313 10     Pain Loc --      Pain Edu? --      Excl. in GC? --     Constitutional: Alert and oriented. Patient in moderate distress Eyes: Conjunctivae are normal. PERRL. EOMI. Head: Atraumatic. Nose: No congestion/rhinnorhea. Mouth/Throat: Mucous membranes are moist.  Oropharynx non-erythematous. Cardiovascular: Normal rate, regular rhythm. Grossly normal heart sounds.  Good peripheral circulation. Respiratory: Normal respiratory effort.  No retractions. Lungs CTAB. Gastrointestinal: Soft and nontender. No distention. Positive bowel sounds Musculoskeletal: No lower extremity tenderness nor edema.   Neurologic:  Normal speech and language. Left-sided facial numbness with left-sided facial droop. Weak grip on left upper extremity strength is a 4 out of 5 Skin:  Skin is warm, dry and intact.  Psychiatric: Mood and affect are normal.   ____________________________________________   LABS (all labs ordered are listed, but only abnormal results are displayed)  Labs Reviewed  CBC - Abnormal; Notable for the following:    RBC 5.44 (*)    Hemoglobin 10.9 (*)    HCT 34.8 (*)    MCV 63.9 (*)    MCH 20.0 (*)    MCHC 31.4 (*)    RDW 18.9 (*)    All other components within normal limits  COMPREHENSIVE METABOLIC PANEL - Abnormal; Notable for the following:    Glucose, Bld 229 (*)    Calcium 8.8 (*)    All other components within normal limits  TROPONIN I   ____________________________________________  EKG  ED ECG REPORT I, Rebecka Apley, the attending physician, personally viewed and interpreted this ECG.   Date: 06/07/2015  EKG Time: 407   Rate: 88  Rhythm: normal sinus rhythm  Axis: normal  Intervals:none  ST&T Change: none  ____________________________________________  RADIOLOGY  CT head: Atrophy with mild small vessel chronic ischemic changes of deep cerebral white matter, old right MCA territory infarct, no acute intracranial  abnormalities ____________________________________________   PROCEDURES  Procedure(s) performed: None  Critical Care performed: No  ____________________________________________   INITIAL IMPRESSION / ASSESSMENT AND PLAN / ED COURSE  Pertinent labs & imaging results that were available during my care of the patient were reviewed by me and considered in my medical decision making (see chart for details).  This is a 49 year old female who comes in today with some headache. The patient reports that this  feels like the headache she's had since she's had her stroke but she is unable to control the pain at home. I did order some blood work on the patient as well as give her some Reglan, Benadryl, a liter of normal saline and Toradol. The patient does have some left-sided facial numbness and facial droop which is concerning. The patient reports that she has baseline left upper extremity weakness from her stroke as well. I will await the patient's blood work and reassess the patient's pain and neurologic status.  The patient's facial droop was improved after her headache improved. She will be discharged to home she needs to follow up with her neurologist ____________________________________________   FINAL CLINICAL IMPRESSION(S) / ED DIAGNOSES  Final diagnoses:  Acute nonintractable headache, unspecified headache type      Rebecka Apley, MD 06/07/15 [phone]

## 2015-06-07 NOTE — Discharge Instructions (Signed)
Headaches, Frequently Asked Questions °MIGRAINE HEADACHES °Q: What is migraine? What causes it? How can I treat it? °A: Generally, migraine headaches begin as a dull ache. Then they develop into a constant, throbbing, and pulsating pain. You may experience pain at the temples. You may experience pain at the front or back of one or both sides of the head. The pain is usually accompanied by a combination of: °· Nausea. °· Vomiting. °· Sensitivity to light and noise. °Some people (about 15%) experience an aura (see below) before an attack. The cause of migraine is believed to be chemical reactions in the brain. Treatment for migraine may include over-the-counter or prescription medications. It may also include self-help techniques. These include relaxation training and biofeedback.  °Q: What is an aura? °A: About 15% of people with migraine get an "aura". This is a sign of neurological symptoms that occur before a migraine headache. You may see wavy or jagged lines, dots, or flashing lights. You might experience tunnel vision or blind spots in one or both eyes. The aura can include visual or auditory hallucinations (something imagined). It may include disruptions in smell (such as strange odors), taste or touch. Other symptoms include: °· Numbness. °· A "pins and needles" sensation. °· Difficulty in recalling or speaking the correct word. °These neurological events may last as long as 60 minutes. These symptoms will fade as the headache begins. °Q: What is a trigger? °A: Certain physical or environmental factors can lead to or "trigger" a migraine. These include: °· Foods. °· Hormonal changes. °· Weather. °· Stress. °It is important to remember that triggers are different for everyone. To help prevent migraine attacks, you need to figure out which triggers affect you. Keep a headache diary. This is a good way to track triggers. The diary will help you talk to your healthcare professional about your condition. °Q: Does  weather affect migraines? °A: Bright sunshine, hot, humid conditions, and drastic changes in barometric pressure may lead to, or "trigger," a migraine attack in some people. But studies have shown that weather does not act as a trigger for everyone with migraines. °Q: What is the link between migraine and hormones? °A: Hormones start and regulate many of your body's functions. Hormones keep your body in balance within a constantly changing environment. The levels of hormones in your body are unbalanced at times. Examples are during menstruation, pregnancy, or menopause. That can lead to a migraine attack. In fact, about three quarters of all women with migraine report that their attacks are related to the menstrual cycle.  °Q: Is there an increased risk of stroke for migraine sufferers? °A: The likelihood of a migraine attack causing a stroke is very remote. That is not to say that migraine sufferers cannot have a stroke associated with their migraines. In persons under age 40, the most common associated factor for stroke is migraine headache. But over the course of a person's normal life span, the occurrence of migraine headache may actually be associated with a reduced risk of dying from cerebrovascular disease due to stroke.  °Q: What are acute medications for migraine? °A: Acute medications are used to treat the pain of the headache after it has started. Examples over-the-counter medications, NSAIDs, ergots, and triptans.  °Q: What are the triptans? °A: Triptans are the newest class of abortive medications. They are specifically targeted to treat migraine. Triptans are vasoconstrictors. They moderate some chemical reactions in the brain. The triptans work on receptors in your brain. Triptans help   to restore the balance of a neurotransmitter called serotonin. Fluctuations in levels of serotonin are thought to be a main cause of migraine.  °Q: Are over-the-counter medications for migraine effective? °A:  Over-the-counter, or "OTC," medications may be effective in relieving mild to moderate pain and associated symptoms of migraine. But you should see your caregiver before beginning any treatment regimen for migraine.  °Q: What are preventive medications for migraine? °A: Preventive medications for migraine are sometimes referred to as "prophylactic" treatments. They are used to reduce the frequency, severity, and length of migraine attacks. Examples of preventive medications include antiepileptic medications, antidepressants, beta-blockers, calcium channel blockers, and NSAIDs (nonsteroidal anti-inflammatory drugs). °Q: Why are anticonvulsants used to treat migraine? °A: During the past few years, there has been an increased interest in antiepileptic drugs for the prevention of migraine. They are sometimes referred to as "anticonvulsants". Both epilepsy and migraine may be caused by similar reactions in the brain.  °Q: Why are antidepressants used to treat migraine? °A: Antidepressants are typically used to treat people with depression. They may reduce migraine frequency by regulating chemical levels, such as serotonin, in the brain.  °Q: What alternative therapies are used to treat migraine? °A: The term "alternative therapies" is often used to describe treatments considered outside the scope of conventional Western medicine. Examples of alternative therapy include acupuncture, acupressure, and yoga. Another common alternative treatment is herbal therapy. Some herbs are believed to relieve headache pain. Always discuss alternative therapies with your caregiver before proceeding. Some herbal products contain arsenic and other toxins. °TENSION HEADACHES °Q: What is a tension-type headache? What causes it? How can I treat it? °A: Tension-type headaches occur randomly. They are often the result of temporary stress, anxiety, fatigue, or anger. Symptoms include soreness in your temples, a tightening band-like sensation  around your head (a "vice-like" ache). Symptoms can also include a pulling feeling, pressure sensations, and contracting head and neck muscles. The headache begins in your forehead, temples, or the back of your head and neck. Treatment for tension-type headache may include over-the-counter or prescription medications. Treatment may also include self-help techniques such as relaxation training and biofeedback. °CLUSTER HEADACHES °Q: What is a cluster headache? What causes it? How can I treat it? °A: Cluster headache gets its name because the attacks come in groups. The pain arrives with little, if any, warning. It is usually on one side of the head. A tearing or bloodshot eye and a runny nose on the same side of the headache may also accompany the pain. Cluster headaches are believed to be caused by chemical reactions in the brain. They have been described as the most severe and intense of any headache type. Treatment for cluster headache includes prescription medication and oxygen. °SINUS HEADACHES °Q: What is a sinus headache? What causes it? How can I treat it? °A: When a cavity in the bones of the face and skull (a sinus) becomes inflamed, the inflammation will cause localized pain. This condition is usually the result of an allergic reaction, a tumor, or an infection. If your headache is caused by a sinus blockage, such as an infection, you will probably have a fever. An x-ray will confirm a sinus blockage. Your caregiver's treatment might include antibiotics for the infection, as well as antihistamines or decongestants.  °REBOUND HEADACHES °Q: What is a rebound headache? What causes it? How can I treat it? °A: A pattern of taking acute headache medications too often can lead to a condition known as "rebound headache."   A pattern of taking too much headache medication includes taking it more than 2 days per week or in excessive amounts. That means more than the label or a caregiver advises. With rebound  headaches, your medications not only stop relieving pain, they actually begin to cause headaches. Doctors treat rebound headache by tapering the medication that is being overused. Sometimes your caregiver will gradually substitute a different type of treatment or medication. Stopping may be a challenge. Regularly overusing a medication increases the potential for serious side effects. Consult a caregiver if you regularly use headache medications more than 2 days per week or more than the label advises. °ADDITIONAL QUESTIONS AND ANSWERS °Q: What is biofeedback? °A: Biofeedback is a self-help treatment. Biofeedback uses special equipment to monitor your body's involuntary physical responses. Biofeedback monitors: °· Breathing. °· Pulse. °· Heart rate. °· Temperature. °· Muscle tension. °· Brain activity. °Biofeedback helps you refine and perfect your relaxation exercises. You learn to control the physical responses that are related to stress. Once the technique has been mastered, you do not need the equipment any more. °Q: Are headaches hereditary? °A: Four out of five (80%) of people that suffer report a family history of migraine. Scientists are not sure if this is genetic or a family predisposition. Despite the uncertainty, a child has a 50% chance of having migraine if one parent suffers. The child has a 75% chance if both parents suffer.  °Q: Can children get headaches? °A: By the time they reach high school, most young people have experienced some type of headache. Many safe and effective approaches or medications can prevent a headache from occurring or stop it after it has begun.  °Q: What type of doctor should I see to diagnose and treat my headache? °A: Start with your primary caregiver. Discuss his or her experience and approach to headaches. Discuss methods of classification, diagnosis, and treatment. Your caregiver may decide to recommend you to a headache specialist, depending upon your symptoms or other  physical conditions. Having diabetes, allergies, etc., may require a more comprehensive and inclusive approach to your headache. The [employer] will provide, upon request, a list of NHF physician members in your state. °Document Released: 11/24/2003 Document Revised: 11/26/2011 Document Reviewed: 05/03/2008 °Exit[employer]® Patient Information ©2015 [employer]. This information is not intended to replace advice given to you by your health care provider. Make sure you discuss any questions you have with your health care provider. ° °Migraine Headache °A migraine headache is an intense, throbbing pain on one or both sides of your head. A migraine can last for 30 minutes to several hours. °CAUSES  °The exact cause of a migraine headache is not always known. However, a migraine may be caused when nerves in the brain become irritated and release chemicals that cause inflammation. This causes pain. °Certain things may also trigger migraines, such as: °· Alcohol. °· Smoking. °· Stress. °· Menstruation. °· Aged cheeses. °· Foods or drinks that contain nitrates, glutamate, aspartame, or tyramine. °· Lack of sleep. °· Chocolate. °· Caffeine. °· Hunger. °· Physical exertion. °· Fatigue. °· Medicines used to treat chest pain (nitroglycerine), birth control pills, estrogen, and some blood pressure medicines. °SIGNS AND SYMPTOMS °· Pain on one or both sides of your head. °· Pulsating or throbbing pain. °· Severe pain that prevents daily activities. °· Pain that is aggravated by any physical activity. °· Nausea, vomiting, or both. °· Dizziness. °· Pain with exposure to bright lights, loud noises, or activity. °·   General sensitivity to bright lights, loud noises, or smells. °Before you get a migraine, you may get warning signs that a migraine is coming (aura). An aura may include: °· Seeing flashing lights. °· Seeing bright spots, halos, or zigzag lines. °· Having tunnel vision or blurred vision. °· Having feelings  of numbness or tingling. °· Having trouble talking. °· Having muscle weakness. °DIAGNOSIS  °A migraine headache is often diagnosed based on: °· Symptoms. °· Physical exam. °· A CT scan or MRI of your head. These imaging tests cannot diagnose migraines, but they can help rule out other causes of headaches. °TREATMENT °Medicines may be given for pain and nausea. Medicines can also be given to help prevent recurrent migraines.  °HOME CARE INSTRUCTIONS °· Only take over-the-counter or prescription medicines for pain or discomfort as directed by your health care provider. The use of long-term narcotics is not recommended. °· Lie down in a dark, quiet room when you have a migraine. °· Keep a journal to find out what may trigger your migraine headaches. For example, write down: °¨ What you eat and drink. °¨ How much sleep you get. °¨ Any change to your diet or medicines. °· Limit alcohol consumption. °· Quit smoking if you smoke. °· Get 7-9 hours of sleep, or as recommended by your health care provider. °· Limit stress. °· Keep lights dim if bright lights bother you and make your migraines worse. °SEEK IMMEDIATE MEDICAL CARE IF:  °· Your migraine becomes severe. °· You have a fever. °· You have a stiff neck. °· You have vision loss. °· You have muscular weakness or loss of muscle control. °· You start losing your balance or have trouble walking. °· You feel faint or pass out. °· You have severe symptoms that are different from your first symptoms. °MAKE SURE YOU:  °· Understand these instructions. °· Will watch your condition. °· Will get help right away if you are not doing well or get worse. °Document Released: 09/03/2005 Document Revised: 01/18/2014 Document Reviewed: 05/11/2013 °ExitCare® Patient Information ©2015 [employer]. This information is not intended to replace advice given to you by your health care provider. Make sure you discuss any questions you have with your health care provider. ° °

## 2015-06-14 ENCOUNTER — Encounter: Payer: Self-pay | Admitting: Urgent Care

## 2015-06-14 ENCOUNTER — Observation Stay
Admission: EM | Admit: 2015-06-14 | Discharge: 2015-06-15 | Disposition: A | Discharge status: Home / Self Care | Payer: BLUE CROSS/BLUE SHIELD | Attending: Internal Medicine | Admitting: Internal Medicine

## 2015-06-14 ENCOUNTER — Observation Stay: Payer: BLUE CROSS/BLUE SHIELD

## 2015-06-14 ENCOUNTER — Emergency Department: Payer: BLUE CROSS/BLUE SHIELD

## 2015-06-14 DIAGNOSIS — Z6841 Body Mass Index (BMI) 40.0 and over, adult: Secondary | ICD-10-CM | POA: Insufficient documentation

## 2015-06-14 DIAGNOSIS — E663 Overweight: Secondary | ICD-10-CM | POA: Insufficient documentation

## 2015-06-14 DIAGNOSIS — Z833 Family history of diabetes mellitus: Secondary | ICD-10-CM | POA: Insufficient documentation

## 2015-06-14 DIAGNOSIS — Z8249 Family history of ischemic heart disease and other diseases of the circulatory system: Secondary | ICD-10-CM | POA: Diagnosis not present

## 2015-06-14 DIAGNOSIS — E119 Type 2 diabetes mellitus without complications: Secondary | ICD-10-CM | POA: Diagnosis not present

## 2015-06-14 DIAGNOSIS — G44009 Cluster headache syndrome, unspecified, not intractable: Principal | ICD-10-CM | POA: Insufficient documentation

## 2015-06-14 DIAGNOSIS — R202 Paresthesia of skin: Secondary | ICD-10-CM | POA: Insufficient documentation

## 2015-06-14 DIAGNOSIS — Z8673 Personal history of transient ischemic attack (TIA), and cerebral infarction without residual deficits: Secondary | ICD-10-CM | POA: Diagnosis not present

## 2015-06-14 DIAGNOSIS — E785 Hyperlipidemia, unspecified: Secondary | ICD-10-CM | POA: Diagnosis not present

## 2015-06-14 DIAGNOSIS — R531 Weakness: Secondary | ICD-10-CM

## 2015-06-14 DIAGNOSIS — Z79899 Other long term (current) drug therapy: Secondary | ICD-10-CM | POA: Insufficient documentation

## 2015-06-14 DIAGNOSIS — G459 Transient cerebral ischemic attack, unspecified: Secondary | ICD-10-CM

## 2015-06-14 DIAGNOSIS — Z7982 Long term (current) use of aspirin: Secondary | ICD-10-CM | POA: Diagnosis not present

## 2015-06-14 DIAGNOSIS — R519 Headache, unspecified: Secondary | ICD-10-CM

## 2015-06-14 DIAGNOSIS — M6289 Other specified disorders of muscle: Secondary | ICD-10-CM | POA: Diagnosis present

## 2015-06-14 DIAGNOSIS — G40909 Epilepsy, unspecified, not intractable, without status epilepticus: Secondary | ICD-10-CM

## 2015-06-14 DIAGNOSIS — G43109 Migraine with aura, not intractable, without status migrainosus: Secondary | ICD-10-CM | POA: Diagnosis present

## 2015-06-14 DIAGNOSIS — R200 Anesthesia of skin: Secondary | ICD-10-CM

## 2015-06-14 DIAGNOSIS — R51 Headache: Secondary | ICD-10-CM

## 2015-06-14 DIAGNOSIS — F329 Major depressive disorder, single episode, unspecified: Secondary | ICD-10-CM | POA: Insufficient documentation

## 2015-06-14 DIAGNOSIS — R208 Other disturbances of skin sensation: Secondary | ICD-10-CM | POA: Diagnosis present

## 2015-06-14 DIAGNOSIS — I10 Essential (primary) hypertension: Secondary | ICD-10-CM | POA: Diagnosis not present

## 2015-06-14 LAB — COMPREHENSIVE METABOLIC PANEL
ALBUMIN: 3.7 g/dL (ref 3.5–5.0)
ALT: 21 U/L (ref 14–54)
AST: 18 U/L (ref 15–41)
Alkaline Phosphatase: 52 U/L (ref 38–126)
Anion gap: 10 (ref 5–15)
BUN: 14 mg/dL (ref 6–20)
CHLORIDE: 99 mmol/L — AB (ref 101–111)
CO2: 28 mmol/L (ref 22–32)
CREATININE: 0.69 mg/dL (ref 0.44–1.00)
Calcium: 9 mg/dL (ref 8.9–10.3)
GFR calc Af Amer: >60 mL/min (ref >60)
GLUCOSE: 369 mg/dL — AB (ref 65–99)
POTASSIUM: 3.5 mmol/L (ref 3.5–5.1)
SODIUM: 137 mmol/L (ref 135–145)
Total Bilirubin: 0.5 mg/dL (ref 0.3–1.2)
Total Protein: 7.2 g/dL (ref 6.5–8.1)

## 2015-06-14 LAB — GLUCOSE, CAPILLARY
Glucose-Capillary: 302 mg/dL — ABNORMAL HIGH (ref 65–99)
Glucose-Capillary: 298 mg/dL — ABNORMAL HIGH (ref 65–99)
Glucose-Capillary: 265 mg/dL — ABNORMAL HIGH (ref 65–99)
Glucose-Capillary: 303 mg/dL — ABNORMAL HIGH (ref 65–99)

## 2015-06-14 LAB — DIFFERENTIAL
BASOS ABS: 0 10*3/uL (ref 0–0.1)
EOS ABS: 0 10*3/uL (ref 0–0.7)
Eosinophils Relative: 0 %
Lymphocytes Relative: 0 %
Lymphs Abs: 0 10*3/uL — ABNORMAL LOW (ref 1.0–3.6)
Monocytes Absolute: 0 10*3/uL — ABNORMAL LOW (ref 0.2–0.9)
NEUTROS ABS: 6.4 10*3/uL (ref 1.4–6.5)

## 2015-06-14 LAB — TROPONIN I

## 2015-06-14 LAB — CBC
HEMATOCRIT: 33.9 % — AB (ref 35.0–47.0)
Hemoglobin: 10.5 g/dL — ABNORMAL LOW (ref 12.0–16.0)
MCH: 19.4 pg — ABNORMAL LOW (ref 26.0–34.0)
MCHC: 31.1 g/dL — AB (ref 32.0–36.0)
MCV: 62.5 fL — ABNORMAL LOW (ref 80.0–100.0)
Platelets: 172 10*3/uL (ref 150–440)
RBC: 5.43 MIL/uL — ABNORMAL HIGH (ref 3.80–5.20)
RDW: 18.3 % — AB (ref 11.5–14.5)
WBC: 6.4 10*3/uL (ref 3.6–11.0)

## 2015-06-14 LAB — SEDIMENTATION RATE: SED RATE: 30 mm/h — AB (ref 0–20)

## 2015-06-14 LAB — PROTIME-INR
INR: 0.99
Prothrombin Time: 13.3 seconds (ref 11.4–15.0)

## 2015-06-14 LAB — APTT: APTT: 29 s (ref 24–36)

## 2015-06-14 LAB — HEMOGLOBIN A1C: Hgb A1c MFr Bld: 12.5 % — ABNORMAL HIGH (ref 4.0–6.0)

## 2015-06-14 MED ORDER — ONDANSETRON HCL 4 MG/2ML IJ SOLN: 4.0000 mg | Freq: Four times a day (QID) | INTRAMUSCULAR | Status: DC | PRN

## 2015-06-14 MED ORDER — SODIUM CHLORIDE 0.9 % IJ SOLN: 3.0000 mL | Freq: Two times a day (BID) | INTRAMUSCULAR | Status: DC

## 2015-06-14 MED ORDER — LISINOPRIL-HYDROCHLOROTHIAZIDE 20-12.5 MG PO TABS: 1.0000 | ORAL_TABLET | Freq: Every day | ORAL | Status: DC

## 2015-06-14 MED ORDER — ROSUVASTATIN CALCIUM 10 MG PO TABS
10.0000 mg | ORAL_TABLET | Freq: Every day | ORAL | Status: DC
Start: 2015-06-14 — End: 2015-06-14
  Administered 2015-06-14: 09:00:00 10 mg via ORAL
  Filled 2015-06-14: qty 1

## 2015-06-14 MED ORDER — INSULIN ASPART 100 UNIT/ML ~~LOC~~ SOLN
0.0000 [IU] | Freq: Every day | SUBCUTANEOUS | Status: DC
  Administered 2015-06-14: 4 [IU] via SUBCUTANEOUS
  Filled 2015-06-14: qty 4

## 2015-06-14 MED ORDER — MAGNESIUM OXIDE 400 (241.3 MG) MG PO TABS
800.0000 mg | ORAL_TABLET | Freq: Once | ORAL | Status: AC
  Administered 2015-06-14: 16:00:00 800 mg via ORAL
  Filled 2015-06-14: qty 2

## 2015-06-14 MED ORDER — SODIUM CHLORIDE 0.9 % IV SOLN
INTRAVENOUS | Status: DC
  Administered 2015-06-14: 06:00:00 via INTRAVENOUS

## 2015-06-14 MED ORDER — DOCUSATE SODIUM 100 MG PO CAPS
100.0000 mg | ORAL_CAPSULE | Freq: Two times a day (BID) | ORAL | Status: DC
  Administered 2015-06-14 – 2015-06-15 (×3): 100 mg via ORAL
  Filled 2015-06-14 (×3): qty 1

## 2015-06-14 MED ORDER — VALPROATE SODIUM 500 MG/5ML IV SOLN
1000.0000 mg | Freq: Once | INTRAVENOUS | Status: DC
  Filled 2015-06-14: qty 10

## 2015-06-14 MED ORDER — FERROUS SULFATE 325 (65 FE) MG PO TABS
325.0000 mg | ORAL_TABLET | Freq: Every day | ORAL | Status: DC
  Administered 2015-06-14 – 2015-06-15 (×2): 325 mg via ORAL
  Filled 2015-06-14 (×2): qty 1

## 2015-06-14 MED ORDER — VALPROIC ACID 250 MG PO CAPS
250.0000 mg | ORAL_CAPSULE | Freq: Two times a day (BID) | ORAL | Status: DC
Start: 2015-06-14 — End: 2015-06-14
  Filled 2015-06-14 (×2): qty 1

## 2015-06-14 MED ORDER — KETOROLAC TROMETHAMINE 30 MG/ML IJ SOLN
30.0000 mg | Freq: Once | INTRAMUSCULAR | Status: AC
  Administered 2015-06-14: 30 mg via INTRAVENOUS
  Filled 2015-06-14: qty 1

## 2015-06-14 MED ORDER — MAGNESIUM SULFATE 50 % IJ SOLN
0.5000 g | Freq: Four times a day (QID) | INTRAVENOUS | Status: DC
  Filled 2015-06-14 (×4): qty 1

## 2015-06-14 MED ORDER — SODIUM CHLORIDE 0.9 % IV BOLUS (SEPSIS)
1000.0000 mL | Freq: Once | INTRAVENOUS | Status: AC
Start: 2015-06-14 — End: 2015-06-14
  Administered 2015-06-14: 1000 mL via INTRAVENOUS

## 2015-06-14 MED ORDER — HEPARIN SODIUM (PORCINE) 5000 UNIT/ML IJ SOLN
5000.0000 [IU] | Freq: Three times a day (TID) | INTRAMUSCULAR | Status: DC
  Administered 2015-06-14 – 2015-06-15 (×5): 5000 [IU] via SUBCUTANEOUS
  Filled 2015-06-14 (×5): qty 1

## 2015-06-14 MED ORDER — INSULIN ASPART 100 UNIT/ML ~~LOC~~ SOLN
0.0000 [IU] | Freq: Three times a day (TID) | SUBCUTANEOUS | Status: DC
  Administered 2015-06-14: 09:00:00 11 [IU] via SUBCUTANEOUS
  Administered 2015-06-14 – 2015-06-15 (×4): 8 [IU] via SUBCUTANEOUS
  Administered 2015-06-15: 08:00:00 11 [IU] via SUBCUTANEOUS
  Filled 2015-06-14 (×2): qty 8
  Filled 2015-06-14 (×2): qty 11
  Filled 2015-06-14 (×2): qty 8

## 2015-06-14 MED ORDER — BUTALBITAL-APAP-CAFFEINE 50-325-40 MG PO TABS
1.0000 | ORAL_TABLET | Freq: Four times a day (QID) | ORAL | Status: DC | PRN
  Administered 2015-06-14: 21:00:00 1 via ORAL
  Filled 2015-06-14: qty 1

## 2015-06-14 MED ORDER — VALPROIC ACID 250 MG PO CAPS
500.0000 mg | ORAL_CAPSULE | Freq: Two times a day (BID) | ORAL | Status: DC
  Administered 2015-06-14 – 2015-06-15 (×3): 500 mg via ORAL
  Filled 2015-06-14 (×4): qty 2

## 2015-06-14 MED ORDER — METOCLOPRAMIDE HCL 5 MG/ML IJ SOLN
5.0000 mg | Freq: Four times a day (QID) | INTRAMUSCULAR | Status: DC
  Filled 2015-06-14: qty 2

## 2015-06-14 MED ORDER — ACETAMINOPHEN 650 MG RE SUPP: 650.0000 mg | Freq: Four times a day (QID) | RECTAL | Status: DC | PRN

## 2015-06-14 MED ORDER — ACETAMINOPHEN 325 MG PO TABS: 650.0000 mg | ORAL_TABLET | Freq: Four times a day (QID) | ORAL | Status: DC | PRN

## 2015-06-14 MED ORDER — KETOROLAC TROMETHAMINE 15 MG/ML IJ SOLN
15.0000 mg | Freq: Four times a day (QID) | INTRAMUSCULAR | Status: DC
  Filled 2015-06-14 (×4): qty 1

## 2015-06-14 MED ORDER — LISINOPRIL 20 MG PO TABS
20.0000 mg | ORAL_TABLET | Freq: Every day | ORAL | Status: DC
  Administered 2015-06-14 – 2015-06-15 (×2): 20 mg via ORAL
  Filled 2015-06-14 (×2): qty 1

## 2015-06-14 MED ORDER — METOCLOPRAMIDE HCL 5 MG PO TABS
5.0000 mg | ORAL_TABLET | Freq: Four times a day (QID) | ORAL | Status: DC
  Administered 2015-06-14 – 2015-06-15 (×5): 5 mg via ORAL
  Filled 2015-06-14 (×5): qty 1

## 2015-06-14 MED ORDER — ROSUVASTATIN CALCIUM 20 MG PO TABS
20.0000 mg | ORAL_TABLET | Freq: Every day | ORAL | Status: DC
  Administered 2015-06-15: 20 mg via ORAL
  Filled 2015-06-14: qty 1

## 2015-06-14 MED ORDER — DIPHENHYDRAMINE HCL 50 MG/ML IJ SOLN
25.0000 mg | Freq: Once | INTRAMUSCULAR | Status: AC
  Administered 2015-06-14: 25 mg via INTRAVENOUS
  Filled 2015-06-14: qty 1

## 2015-06-14 MED ORDER — ONDANSETRON HCL 4 MG PO TABS: 4.0000 mg | ORAL_TABLET | Freq: Four times a day (QID) | ORAL | Status: DC | PRN

## 2015-06-14 MED ORDER — ASPIRIN EC 325 MG PO TBEC
325.0000 mg | DELAYED_RELEASE_TABLET | Freq: Every day | ORAL | Status: DC
  Administered 2015-06-14 – 2015-06-15 (×2): 325 mg via ORAL
  Filled 2015-06-14 (×2): qty 1

## 2015-06-14 MED ORDER — METOCLOPRAMIDE HCL 5 MG/ML IJ SOLN
10.0000 mg | Freq: Once | INTRAMUSCULAR | Status: AC
  Administered 2015-06-14: 10 mg via INTRAVENOUS
  Filled 2015-06-14: qty 2

## 2015-06-14 MED ORDER — OXYCODONE-ACETAMINOPHEN 5-325 MG PO TABS
1.0000 | ORAL_TABLET | Freq: Four times a day (QID) | ORAL | Status: DC | PRN
  Administered 2015-06-15: 1 via ORAL
  Filled 2015-06-14: qty 1

## 2015-06-14 MED ORDER — DIVALPROEX SODIUM 500 MG PO DR TAB
500.0000 mg | DELAYED_RELEASE_TABLET | Freq: Once | ORAL | Status: AC
  Administered 2015-06-14: 16:00:00 500 mg via ORAL
  Filled 2015-06-14: qty 1

## 2015-06-14 MED ORDER — LORAZEPAM 1 MG PO TABS
1.0000 mg | ORAL_TABLET | Freq: Once | ORAL | Status: AC
  Administered 2015-06-14: 1 mg via ORAL
  Filled 2015-06-14: qty 1

## 2015-06-14 MED ORDER — MAGNESIUM OXIDE 400 (241.3 MG) MG PO TABS
400.0000 mg | ORAL_TABLET | Freq: Every day | ORAL | Status: DC
  Administered 2015-06-14 – 2015-06-15 (×2): 400 mg via ORAL
  Filled 2015-06-14 (×2): qty 1

## 2015-06-14 MED ORDER — HYDROCHLOROTHIAZIDE 12.5 MG PO CAPS
12.5000 mg | ORAL_CAPSULE | Freq: Every day | ORAL | Status: DC
  Administered 2015-06-14 – 2015-06-15 (×2): 12.5 mg via ORAL
  Filled 2015-06-14 (×2): qty 1

## 2015-06-14 NOTE — Progress Notes (Signed)
CSW consult.  Patient is from home with husband.  No CSW needs identified at this time.  RN Case manager will follow for any home needs. Please consult if CSW needs arise.  CSW signing off on consult. Sammuel Hines. Theresia Majors, MSW Clinical Social Work Department Emergency Room [phone] 11:37 AM

## 2015-06-14 NOTE — ED Provider Notes (Signed)
[facility] Emergency Department Provider Note  ____________________________________________  Time seen: Approximately 232 AM  I have reviewed the triage vital signs and the nursing notes.   HISTORY  Chief Complaint Headache and Weakness    HPI [name] is a 49 y.o. female who comes into the hospital today with a headache. The patient was here last week with a headache. The patient reports that she was sleeping and she felt some pressure in her head that woke her up out of sleep. The patient reports earlier today she did not have a headache and she had been doing well. Since the last headache the patient has not seen a neurologist but reports that she is unable to get an appointment soon. The patient reports she also has some increased weakness on her left side. The patient has a history of a stroke with left-sided deficit but reports that she is worse on the left side and has been stumbling for the last 2 days. The patient was last seen normal at 10 PM when she went to sleep and woke up with symptoms at 2 AM. The patient some facial numbness and some facial droop with a history of abscess in her left face. She reports that her headache is a 9/2 out of 10 in intensity at the back left side of her head. She reports that her arm is weak and she's had some chest pain earlier. The patient has some blurred vision. The patient came in for treatment and evaluation.    Past Medical History  Diagnosis Date  . HTN (hypertension)   . Diabetes   . Hyperlipidemia   . Depression   . Stroke     Patient Active Problem List   Diagnosis Date Noted  . 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 Headache 05/19/2015  . Weakness of left side of body 05/19/2015  . H/O: CVA (cerebrovascular accident) 05/19/2015  . Headache(784.0) 03/19/2014  . Severe obesity (BMI >= 40) 03/19/2014  . HTN (hypertension)   . Diabetes   . Hyperlipidemia   . [name] (transient ischemic attack) 03/17/2014     Past Surgical History  Procedure Laterality Date  . Cesarean section      No current outpatient prescriptions on file.  Allergies Review of patient's allergies indicates no known allergies.  Family History  Problem Relation Age of Onset  . Hypertension Mother   . Hypertension Father   . Diabetes Mellitus II Mother     Social History Social History  Substance Use Topics  . Smoking status: Never Smoker   . Smokeless tobacco: None  . Alcohol Use: No    Review of Systems Constitutional: No fever/chills Eyes: Blurred vision ENT: No sore throat. Cardiovascular: chest pain. Respiratory: Denies shortness of breath. Gastrointestinal: No abdominal pain.  No nausea, no vomiting.  No diarrhea.  No constipation. Genitourinary: Negative for dysuria. Musculoskeletal: Negative for back pain. Skin: Negative for rash. Neurological: Headache, facial numbness and left-sided weakness  10-point ROS otherwise negative.  ____________________________________________   PHYSICAL EXAM:  VITAL SIGNS: ED Triage Vitals  Enc Vitals Group     BP 06/14/15 0234 157/93 mmHg     Pulse Rate 06/14/15 0234 86     Resp 06/14/15 0234 18     Temp 06/14/15 0234 98.6 F (37 C)     Temp Source 06/14/15 0234 Oral     SpO2 06/14/15 0234 98 %     Weight 06/14/15 0234 242 lb 3.2 oz (109.861 kg)  Height --      Head Cir --      Peak Flow --      Pain Score 06/14/15 0236 9     Pain Loc --      Pain Edu? --      Excl. in GC? --     Constitutional: Alert and oriented. Well appearing and in moderate distress. Eyes: Conjunctivae are normal. PERRL. EOMI. Head: Atraumatic. Nose: No congestion/rhinnorhea. Mouth/Throat: Mucous membranes are moist.  Oropharynx non-erythematous. Cardiovascular: Normal rate, regular rhythm. Grossly normal heart sounds.  Good peripheral circulation. Respiratory: Normal respiratory effort.  No retractions. Lungs CTAB. Gastrointestinal: Soft and nontender. No  distention. Positive bowel sounds Musculoskeletal: No lower extremity tenderness nor edema.   Neurologic:  Normal speech and language. Left-sided facial droop, left-sided pronator drift, 5 out of 5 strength in left upper extremity. A 5 strength in left lower extremity numbness to left side face, upper and lower extremity dysmetria on left side. Skin:  Skin is warm, dry and intact. No rash noted. Psychiatric: Mood and affect are normal.   ____________________________________________   LABS (all labs ordered are listed, but only abnormal results are displayed)  Labs Reviewed  CBC - Abnormal; Notable for the following:    RBC 5.43 (*)    Hemoglobin 10.5 (*)    HCT 33.9 (*)    MCV 62.5 (*)    MCH 19.4 (*)    MCHC 31.1 (*)    RDW 18.3 (*)    All other components within normal limits  DIFFERENTIAL - Abnormal; Notable for the following:    Lymphs Abs 0.0 (*)    Monocytes Absolute 0.0 (*)    All other components within normal limits  COMPREHENSIVE METABOLIC PANEL - Abnormal; Notable for the following:    Chloride 99 (*)    Glucose, Bld 369 (*)    All other components within normal limits  GLUCOSE, CAPILLARY - Abnormal; Notable for the following:    Glucose-Capillary 302 (*)    All other components within normal limits  PROTIME-INR  APTT  TROPONIN I  HEMOGLOBIN A1C   ____________________________________________  EKG  ED ECG REPORT I, Rebecka Apley, the attending physician, personally viewed and interpreted this ECG.   Date: 06/14/2015  EKG Time: 246  Rate: 87  Rhythm: normal sinus rhythm  Axis: normal  Intervals:none  ST&T Change: none  ____________________________________________  RADIOLOGY  CT head: No acute intracranial abnormality, remote right MCA distribution infarct unchanged ____________________________________________   PROCEDURES  Procedure(s) performed: None  Critical Care performed:  No  ____________________________________________   INITIAL IMPRESSION / ASSESSMENT AND PLAN / ED COURSE  Pertinent labs & imaging results that were available during my care of the patient were reviewed by me and considered in my medical decision making (see chart for details).  This is a 49 year old female who comes in with headache tonight as well as some left-sided deficit neurologically. The patient does have a history of stroke with persistent left-sided weakness but she reports it is worse. The patient was seen last week and did not have the weakness that she does have today. We did call a code stroke although it is been over 4-1/2 hours since the patient was last seen normal. The patient had an NIH stroke scale of 7 but does not qualify for TPA due to times since last seen normal. The patient was given Reglan and Benadryl as well as a liter of normal saline and Toradol for her headache. Her headache  did improve as well as some of the weakness that she had during the time in the emergency department. The patient reports that she does continue to have some left-sided facial numbness so we will admit the patient for further evaluation of her symptoms. ____________________________________________   FINAL CLINICAL IMPRESSION(S) / ED DIAGNOSES  Final diagnoses:  Acute nonintractable headache, unspecified headache type  Facial numbness  Left-sided weakness      Rebecka Apley, MD 06/14/15 [id number]

## 2015-06-14 NOTE — Plan of Care (Signed)
Neurologist states that if headache is gone in am, pt can be d/ced home.  EEG showed multiple R temporal sharps which he states points to epilepsy.  He increased depakote does.

## 2015-06-14 NOTE — ED Notes (Signed)
Patient transported to CT; this RN to radiology with patient who is is on CCM and NIBP.

## 2015-06-14 NOTE — Progress Notes (Signed)
Inpatient Diabetes Program Recommendations  AACE/ADA: New Consensus Statement on Inpatient Glycemic Control (2015)  Target Ranges:  Prepandial:   less than 140 mg/dL      Peak postprandial:   less than 180 mg/dL (1-2 hours)      Critically ill patients:  140 - 180 mg/dL   Review of Glycemic Control:  Results for DANESHA, KIRCHOFF (MRN [id number]) as of 06/14/2015 10:38  Ref. Range 06/14/2015 07:39  Glucose-Capillary Latest Ref Range: 65-99 mg/dL 409 (H)    Outpatient Diabetes medications: Glipizide 5 mg daily, Metformin 1000 mg bid, Victoza 18 mg daily Current orders for Inpatient glycemic control:  Novolog moderate tid with meals and HS  Inpatient Diabetes Program Recommendations:    A1C pending.  Please consider adding basal insulin (0.2 units/kg).   Please consider adding Levemir 20 units daily.   Thanks, Beryl Meager, RN, BC-ADM Inpatient Diabetes Coordinator Pager [phone] (8a-5p)

## 2015-06-14 NOTE — Plan of Care (Signed)
Problem: Discharge Progression Outcomes Goal: Discharge plan in place and appropriate Individualization:  Outcome: Progressing Pt lives at home w/husband and two adult daughters.  Sons and grandchildren are in [place], [place]. Goal: Pain controlled with appropriate interventions Outcome: Progressing Pt didn't c/o any pain.  Toradol, and Mg added for headache.  Goal: Hemodynamically stable Outcome: Progressing VSS Goal: Complications resolved/controlled Outcome: Progressing Pt having neuro consult for hx of stroke and complicated migraines that she's experienced since the strokes which occurred 4.2014 and 01/2015.  Pt has elevated A1c - 12.5 - only takes metformin at home. Elevated Sed Rate = 30.  Also chking CRP and sickle cell screen.  Will put in dietary consult and diabetic education consult.  Goal: Tolerating diet Outcome: Progressing Pt on Heart Health diet - tolerating.  Will put in dietary consult for A1C of 12.5. Goal: Activity appropriate for discharge plan Outcome: Progressing Pt ambulates independently w/standby asst to BR.  She reports L sided weakness and numbness from strokes.  Also exhibits some facial droop.  Goal: Other Discharge Outcomes/Goals Outcome: Progressing Plan is for pt to return home w/husband and daughters.

## 2015-06-14 NOTE — ED Notes (Signed)
Patient reports complete resolution of her headache. Neuro symptoms have improved; continues with some LEFT side weakness and LEFT facial numbness (see NIHSS). MD made aware.

## 2015-06-14 NOTE — Progress Notes (Addendum)
NEUROLOGY  S:  Pt states that now headache is gone to me and nurse but there has been inconsistency in report.  Pt reports having a 10/10 headache at least 3x/wk that are disabling.  She denies photophobia but there is some nausea associated with these headaches.  She reports that they started in 2014 after she had her large stroke and that sometimes that she gets some increased L sided weakness with these episodes but none now.  O:  98.6   158/97    91    18 Exam deferred  MRI of brain personally reviewed by me from 05/19/15 and shows old large R MCA infarct CT reviewed personally from me today shows the same R MCA infarct MRA showed severe intracranial atherosclerosis  A/P: 1.  Headache-  Migraines dont typically start at age 50 especially after a stroke;  Suspect epileptic phenomena as source or even a depressive condition as well causing tension type headaches. 2.  R MCA infarct-  Stable, no clear etiology in past 3.  Intracranial atherosclerosis-  Severe -  EEG -  Load Depakote 1gm IV now and increase home dose to 526m BID, check level -  Magnesium sulfate 50382m reglan 82m74mnd toradol 182m63mh IV for headache -  PRN Fioricet ok -  Check ESR, CRP, sickle cell and depakote level -  Will follow and make additional recommendations, if headache gone in the morning or less than 2/10 pt can go home -  [facility]   EEG with multiple R temporal sharps which points to epilepsy as likely cause of headaches.  Will need long term therapy.  Will start with increasing Depakote for now.

## 2015-06-14 NOTE — ED Notes (Signed)
Presents via EMS for headache and weakness. PMH significant for CVA x 2.

## 2015-06-14 NOTE — H&P (Signed)
[name] is an 49 y.o. female.   Chief Complaint: Headache HPI: The patient presented to emergency department via EMS complaining of headache. The headache began in her occiput and awoke her from sleep. She denies a thunderclap sensation but felt as if the headache or squeezing the back of her skull. She states that her usual headaches which are a chronic complaint are in her temples and on her left ample more frequently than the right temple. She admits to associated nausea and states that her chest hurt at the time she was awakened by the headache but does not hurt now. She denies visual changes associated with the headache but states that her vision has become worse since her stroke. She also states that the left side of her face feels numb and was tingly at one time. She states that the severity of the headache at onset was 10 out of 10 and is now 8 out of 10 after Toradol Reglan and Benadryl. NIH stroke scale 7 on presentation. Due to her history of cerebrovascular accident the emergency department staff called for admission.  Past Medical History  Diagnosis Date  . HTN (hypertension)   . Diabetes   . Hyperlipidemia   . Depression   . Stroke     Past Surgical History  Procedure Laterality Date  . Cesarean section      Family History  Problem Relation Age of Onset  . Hypertension Mother   . Hypertension Father   . Diabetes Mellitus II Mother    Social History:  reports that she has never smoked. She does not have any smokeless tobacco history on file. She reports that she does not drink alcohol. Her drug history is not on file.  Allergies: No Known Allergies  Medications Prior to Admission  Medication Sig Dispense Refill  . amoxicillin-clavulanate (AUGMENTIN) 875-125 MG per tablet Take 1 tablet by mouth 2 (two) times daily.    Marland Kitchen aspirin EC 325 MG EC tablet Take 1 tablet (325 mg total) by mouth daily. 30 tablet 0  . butalbital-acetaminophen-caffeine (FIORICET) 50-325-40 MG per  tablet Take 1-2 tablets by mouth every 6 (six) hours as needed for headache. 20 tablet 0  . glipiZIDE (GLUCOTROL XL) 5 MG 24 hr tablet Take 5 mg by mouth daily.    . Iron TABS Take 1 tablet by mouth daily. Strength unknown. otc.    . Liraglutide (VICTOZA) 18 MG/3ML SOPN Inject 1 Dose into the skin daily.    Marland Kitchen lisinopril-hydrochlorothiazide (PRINZIDE,ZESTORETIC) 20-12.5 MG per tablet Take 1 tablet by mouth daily. 30 tablet 1  . metFORMIN (GLUCOPHAGE) 1000 MG tablet Take 1,000 mg by mouth 2 (two) times daily with a meal.    . oxyCODONE-acetaminophen (PERCOCET/ROXICET) 5-325 MG per tablet Take 1 tablet by mouth every 6 (six) hours as needed.    . rosuvastatin (CRESTOR) 10 MG tablet Take 10 mg by mouth daily.    Marland Kitchen valproic acid (DEPAKENE) 250 MG capsule Take 1 capsule (250 mg total) by mouth 2 (two) times daily. 60 capsule 0    Results for orders placed or performed during the hospital encounter of 06/14/15 (from the past 48 hour(s))  Protime-INR     Status: None   Collection Time: 06/14/15  3:16 AM  Result Value Ref Range   Prothrombin Time 13.3 11.4 - 15.0 seconds   INR 0.99   APTT     Status: None   Collection Time: 06/14/15  3:16 AM  Result Value Ref Range   aPTT  29 24 - 36 seconds  CBC     Status: Abnormal   Collection Time: 06/14/15  3:16 AM  Result Value Ref Range   WBC 6.4 3.6 - 11.0 K/uL   RBC 5.43 (H) 3.80 - 5.20 MIL/uL   Hemoglobin 10.5 (L) 12.0 - 16.0 g/dL   HCT 33.9 (L) 35.0 - 47.0 %   MCV 62.5 (L) 80.0 - 100.0 fL   MCH 19.4 (L) 26.0 - 34.0 pg   MCHC 31.1 (L) 32.0 - 36.0 g/dL   RDW 18.3 (H) 11.5 - 14.5 %   Platelets 172 150 - 440 K/uL    Comment: PLATELET COUNT CONFIRMED BY SMEAR  Differential     Status: Abnormal   Collection Time: 06/14/15  3:16 AM  Result Value Ref Range   Neutrophils Relative % 100% %   Neutro Abs 6.4 1.4 - 6.5 K/uL   Lymphocytes Relative 0% %   Lymphs Abs 0.0 (L) 1.0 - 3.6 K/uL   Monocytes Relative 0% %   Monocytes Absolute 0.0 (L) 0.2 - 0.9  K/uL   Eosinophils Relative 0% %   Eosinophils Absolute 0.0 0 - 0.7 K/uL   Basophils Relative 0% %   Basophils Absolute 0.0 0 - 0.1 K/uL  Comprehensive metabolic panel     Status: Abnormal   Collection Time: 06/14/15  3:16 AM  Result Value Ref Range   Sodium 137 135 - 145 mmol/L   Potassium 3.5 3.5 - 5.1 mmol/L   Chloride 99 (L) 101 - 111 mmol/L   CO2 28 22 - 32 mmol/L   Glucose, Bld 369 (H) 65 - 99 mg/dL   BUN 14 6 - 20 mg/dL   Creatinine, Ser 0.69 0.44 - 1.00 mg/dL   Calcium 9.0 8.9 - 10.3 mg/dL   Total Protein 7.2 6.5 - 8.1 g/dL   Albumin 3.7 3.5 - 5.0 g/dL   AST 18 15 - 41 U/L   ALT 21 14 - 54 U/L   Alkaline Phosphatase 52 38 - 126 U/L   Total Bilirubin 0.5 0.3 - 1.2 mg/dL   GFR calc non Af Amer >60 >60 mL/min   GFR calc Af Amer >60 >60 mL/min    Comment: (NOTE) The eGFR has been calculated using the CKD EPI equation. This calculation has not been validated in all clinical situations. eGFR's persistently <60 mL/min signify possible Chronic Kidney Disease.    Anion gap 10 5 - 15  Troponin I     Status: None   Collection Time: 06/14/15  3:16 AM  Result Value Ref Range   Troponin I <0.03 <0.031 ng/mL    Comment:        NO INDICATION OF MYOCARDIAL INJURY.    Ct Head Wo Contrast  06/14/2015   CLINICAL DATA:  Headache and left-sided weakness.  EXAM: CT HEAD WITHOUT CONTRAST  TECHNIQUE: Contiguous axial images were obtained from the base of the skull through the vertex without intravenous contrast.  COMPARISON:  Head CT 1 week prior 05/2015, brain MRI 05/19/2015  FINDINGS: No intracranial hemorrhage, mass effect, or midline shift. Remote right MCA distribution infarct and associated ex vacuo dilatation of the right lateral ventricle, unchanged. Background mild chronic small vessel ischemic change. No CT findings of acute ischemia. No hydrocephalus. The basilar cisterns are patent. No intracranial fluid collection. Calvarium is intact. Included paranasal sinuses and mastoid air  cells are well aerated.  IMPRESSION: 1.  No acute intracranial abnormality. 2. Remote right MCA distribution infarct, unchanged.   Electronically  Signed   By: Jeb Levering M.D.   On: 06/14/2015 03:07    Review of Systems  Constitutional: Negative for fever and chills.  HENT: Negative for sore throat and tinnitus.   Eyes: Negative for blurred vision and redness.  Respiratory: Negative for cough and shortness of breath.   Cardiovascular: Positive for chest pain. Negative for palpitations, orthopnea and PND.  Gastrointestinal: Negative for nausea, vomiting, abdominal pain and diarrhea.  Genitourinary: Negative for dysuria, urgency and frequency.  Musculoskeletal: Negative for myalgias and joint pain.  Skin: Negative for rash.       No lesions  Neurological: Positive for tingling and headaches. Negative for speech change, focal weakness and weakness.  Endo/Heme/Allergies: Does not bruise/bleed easily.       No temperature intolerance  Psychiatric/Behavioral: Negative for depression and suicidal ideas.    Blood pressure 162/57, pulse 91, temperature 98.6 F (37 C), temperature source Oral, resp. rate 18, height 5' 2"  (1.575 m), weight 108.863 kg (240 lb), last menstrual period 06/06/2015, SpO2 97 %. Physical Exam  Nursing note and vitals reviewed. Constitutional: She is oriented to person, place, and time. She appears well-developed and well-nourished. No distress.  HENT:  Head: Normocephalic and atraumatic.  Mouth/Throat: Oropharynx is clear and moist.  Eyes: EOM are normal. Pupils are equal, round, and reactive to light.  Neck: Normal range of motion. Neck supple. No JVD present. No tracheal deviation present. No thyromegaly present.  Cardiovascular: Normal rate, regular rhythm and normal heart sounds.  Exam reveals no gallop and no friction rub.   No murmur heard. Respiratory: Effort normal and breath sounds normal.  GI: Soft. Bowel sounds are normal. She exhibits no distension.  There is no tenderness.  Genitourinary:  Deferred  Musculoskeletal: Normal range of motion. She exhibits no edema.  Lymphadenopathy:    She has no cervical adenopathy.  Neurological: She is alert and oriented to person, place, and time. A cranial nerve deficit is present. She exhibits normal muscle tone.  Left side facial droop; 3 out of 5 strength left lower extremity  Skin: Skin is warm and dry. No rash noted. No erythema.  Psychiatric: She has a normal mood and affect. Her behavior is normal. Judgment and thought content normal.     Assessment/Plan: This is a 49 year old African American female admitted for complicated migraine.  1. Headaches: Compazine and migraine. The patient initially told ED staff that her headache was more left-sided than right. She equivocally endorses same to me however states that the headache began in her occiput.This is a cluster headache but with her paresthesias the left side of her face it may qualify as a complicated migraine. She has no new neurologic deficits. We'll place a neurology consult for further guidance. Also of note, she told the primary floor nurse that her head was no longer hurting approximately 1 minute prior to telling me it was 8 out of 10 in severity. Patient has Fioricet, Percocet and Depakene for chronic headaches. Continue aspirin. Consider other antiplatelet agents. 2. Hypertension: Continue chlorothiazide and lisinopril 3. Diabetes mellitus type 2: Hold metformin and oral hypoglycemics. Place patient on sliding scale insulin while hospitalized 4. DVT prophylaxis: Heparin 5. GI prophylaxis: None The patient is a full code. Time spent on admission orders and patient care possibly 35 minutes   Harrie Foreman 06/14/2015, 7:00 AM

## 2015-06-14 NOTE — Plan of Care (Signed)
Problem: Discharge Progression Outcomes Goal: Discharge plan in place and appropriate Individualization: Pt prefers to be called [name] who lives at home with her husband and daughter.  Hx stroke w/ left sided weakness. Recently admitted for stroke work up which was negative.  Moderate fall risk. Bed alarm on, hourly rounding. Pt understands how to use call system for assistance out of bed.

## 2015-06-15 LAB — GLUCOSE, CAPILLARY
Glucose-Capillary: 308 mg/dL — ABNORMAL HIGH (ref 65–99)
Glucose-Capillary: 284 mg/dL — ABNORMAL HIGH (ref 65–99)
Glucose-Capillary: 264 mg/dL — ABNORMAL HIGH (ref 65–99)

## 2015-06-15 LAB — SICKLE CELL SCREEN: SICKLE CELL SCREEN: NEGATIVE

## 2015-06-15 LAB — VALPROIC ACID LEVEL: VALPROIC ACID LVL: 47 ug/mL — AB (ref 50.0–100.0)

## 2015-06-15 LAB — HIGH SENSITIVITY CRP: CRP, High Sensitivity: 8.46 mg/L — ABNORMAL HIGH (ref 0.00–3.00)

## 2015-06-15 MED ORDER — VALPROIC ACID 250 MG PO CAPS
500.0000 mg | ORAL_CAPSULE | Freq: Once | ORAL | Status: AC
  Administered 2015-06-15: 19:00:00 500 mg via ORAL
  Filled 2015-06-15: qty 2

## 2015-06-15 MED ORDER — LIVING WELL WITH DIABETES BOOK
Freq: Once | Status: AC
  Administered 2015-06-15: 17:00:00
  Filled 2015-06-15: qty 1

## 2015-06-15 MED ORDER — GLIPIZIDE ER 5 MG PO TB24
5.0000 mg | ORAL_TABLET | Freq: Every day | ORAL | Status: DC
Start: 2015-06-15 — End: 2015-06-15
  Administered 2015-06-15: 5 mg via ORAL
  Filled 2015-06-15: qty 1

## 2015-06-15 MED ORDER — BUTALBITAL-APAP-CAFFEINE 50-325-40 MG PO TABS: 1.0000 | ORAL_TABLET | Freq: Four times a day (QID) | ORAL | Status: DC | PRN

## 2015-06-15 MED ORDER — INSULIN STARTER KIT- PEN NEEDLES (ENGLISH)
1.0000 | Freq: Once | Status: AC
  Administered 2015-06-15: 17:00:00 1
  Filled 2015-06-15: qty 1

## 2015-06-15 MED ORDER — INSULIN DETEMIR 100 UNIT/ML FLEXPEN: 20.0000 [IU] | PEN_INJECTOR | Freq: Every day | SUBCUTANEOUS | Status: DC

## 2015-06-15 MED ORDER — VALPROIC ACID 250 MG PO CAPS: 500.0000 mg | ORAL_CAPSULE | Freq: Two times a day (BID) | ORAL | Status: DC

## 2015-06-15 NOTE — Progress Notes (Signed)
Called and spoke with patient again regarding plan for MD to start basal insulin.  She states that currently her cousin is coming by in the mornings to administer her Victoza.  She wonders if she could also give the Levemir.  She states that since her stroke, she has been more forgetful and her cousin helps her remember and also does the injection for her.  Described that the administration of insulin with the pen is the same as Victoza except for the 2 unit prime prior at administering the dose.  Insulin starter kit has a pamphlet that also describes process including prime and holding for 6-10 seconds after injection.  Discussed hypoglycemia signs and symptoms and treatment with patient.  Spoke also with patient's husband by telephone to inform him of new insulin that would be started.  Discussed signs and symptoms of hypoglycemia and treatment with husband.  He states "do you have a pamphlet".  Told him that pamphlet is in insulin starter kit.  Will ask RN to please show patient and husband the insulin pamphlet at discharge.  Needs to follow-up with PCP this week.    Thanks, Adah Perl, RN, BC-ADM Inpatient Diabetes Coordinator Pager [phone] (8a-5p)

## 2015-06-15 NOTE — Plan of Care (Addendum)
Problem: Food- and Nutrition-Related Knowledge Deficit (NB-1.1) Goal: Nutrition education Formal process to instruct or train a patient/client in a skill or to impart knowledge to help patients/clients voluntarily manage or modify food choices and eating behavior to maintain or improve health. Outcome: Progressing  RD consulted for nutrition education regarding diabetes.     Lab Results  Component Value Date    HGBA1C 12.5* 06/14/2015    RD provided "Carbohydrate Counting for People with Diabetes" handout from the Academy of [employer] and Dietetics. Discussed different food groups and their effects on blood sugar, emphasizing carbohydrate-containing foods. Provided list of carbohydrates and recommended serving sizes of common foods.  Discussed importance of controlled and consistent carbohydrate intake throughout the day. Provided examples of ways to balance meals/snacks and encouraged intake of high-fiber, whole grain complex carbohydrates. Discussed avoidance of sugar-containing beverages as pt currently drinks regular soda, tea sweetened with sugar. Suggested alternative beverages.  Expect fair compliance due to pt's limited ability to demonstrate understanding of diabetic diet post education today. Recommend further education as outpatient, noted referral in computer for outpatient education. Discussed with pt. RD will be glad to follow-up and reinforce diet while in hospital; pt aware that she can request to speak with dietitian again if she has further questions. Pt reports no questions at this time.   Current diet order is Heart healthy/Carb Modified, patient is consuming approximately 90-100% of meals at this time. Labs and medications reviewed. No further nutrition interventions warranted at this time. RD contact information provided. If additional nutrition issues arise, please re-consult RD.  [name] MS, RD, LDN [phone] Pager

## 2015-06-15 NOTE — Progress Notes (Signed)
Inpatient Diabetes Program Recommendations  AACE/ADA: New Consensus Statement on Inpatient Glycemic Control (2015)  Target Ranges:  Prepandial:   less than 140 mg/dL      Peak postprandial:   less than 180 mg/dL (1-2 hours)      Critically ill patients:  140 - 180 mg/dL   Review of Glycemic Control:  Results for TANICKA, BISAILLON (MRN [id number]) as of 06/15/2015 13:14  Ref. Range 06/14/2015 11:21 06/14/2015 16:23 06/14/2015 21:07 06/15/2015 07:02 06/15/2015 11:03  Glucose-Capillary Latest Ref Range: 65-99 mg/dL 298 (H) 265 (H) 303 (H) 308 (H) 284 (H)  Results for MATALYNN, GRAFF (MRN [id number]) as of 06/15/2015 13:14  Ref. Range 06/14/2015 03:16  Hemoglobin A1C Latest Ref Range: 4.0-6.0 % 12.5 (H)    Diabetes history: Type 2 diabetes  Inpatient Diabetes Program Recommendations:     Note that A1C indicates poor control of blood sugars prior to admission.  Called and spoke with patient by phone regarding home diabetes control.  Explained results of A1C to patient which indicates uncontrolled diabetes.  She states that her PCP, Dr. Rosario Jacks has indicated that she may need insulin.  She currently takes Victoza using pen device and is interested in having a pen for insulin delivery also.    Spoke with Dr. Darvin Neighbours regarding patient's A1C and elevated CBG's.  He states that he will likely send her home on basal insulin.  Discussed dosing with MD. Will order insulin pen teaching kit, Living Well with diabetes booklet, and will call patient and RN to discuss plan. Patient will need to follow-up with PCP in the next week for insulin titration, etc.    Thanks, Adah Perl, RN, BC-ADM Inpatient Diabetes Coordinator Pager [phone] (8a-5p)

## 2015-06-15 NOTE — Plan of Care (Signed)
Problem: Discharge Progression Outcomes Goal: Other Discharge Outcomes/Goals Plan of care progress to goal: - Complained of headache, PRN fioricet given with improvement. - Ambulates with assist. - Magnesium IV changed to PO per MD. - Possible d/c today.

## 2015-06-15 NOTE — Progress Notes (Signed)
NEUROLOGY  S:  Mild headache today that improved with Fioricet.  Feels better today  ROS neg x 8 systems except for subjective  O:  98.0  132/79   95   18 Overweight, NAD Normocephalic, oropharynx clear Supple, no JVD CTA B, no wheezing RRR, no murmur No C/C/E  Alert and oriented x 3, nl speech and language PERRLA, EOMI, face symmetric L 4/5, R 5/5  A/P: 1.  Epilepsy-  This is cause of headaches and fluctuating weakness; proven by EEG 2.  Headaches-  These are secondary to 1. -  Continue Depakote  BID -  PRN Fioricet for headaches -  No driving or operating heavy machinery x 6 months -  Will sign off, please call with questions -  Needs to f/u with [facility] in 3 months

## 2015-06-15 NOTE — Plan of Care (Signed)
Problem: Discharge Progression Outcomes Goal: Other Discharge Outcomes/Goals Outcome: Progressing Pt d/ced home.  Spoke to [employer] abt best diet for her diabetes.  Spoke to diabetes educator - she's being put on insulin pin injection.  Got education book.  Spoke to neurologist abt results of EEG and new medications.  Reviewed d/c instructions and scripts.  Pt is leaving w/husband.

## 2015-06-15 NOTE — Discharge Instructions (Addendum)
°  DIET:  Diabetic diet  DISCHARGE CONDITION:  Stable  ACTIVITY:  Activity as tolerated  OXYGEN:  Home Oxygen: No.   Oxygen Delivery: room air  DISCHARGE LOCATION:  home   If you experience worsening of your admission symptoms, develop shortness of breath, life threatening emergency, suicidal or homicidal thoughts you must seek medical attention immediately by calling 911 or calling your MD immediately  if symptoms less severe.  You Must read complete instructions/literature along with all the possible adverse reactions/side effects for all the Medicines you take and that have been prescribed to you. Take any new Medicines after you have completely understood and accpet all the possible adverse reactions/side effects.   Please note  You were cared for by a hospitalist during your hospital stay. If you have any questions about your discharge medications or the care you received while you were in the hospital after you are discharged, you can call the unit and asked to speak with the hospitalist on call if the hospitalist that took care of you is not available. Once you are discharged, your primary care physician will handle any further medical issues. Please note that NO REFILLS for any discharge medications will be authorized once you are discharged, as it is imperative that you return to your primary care physician (or establish a relationship with a primary care physician if you do not have one) for your aftercare needs so that they can reassess your need for medications and monitor your lab values.   Check blood sugars 4 times a day before meals and bedtime. Keep log and take it to your doctors appt.  No driving or operating heavy machinery x 6 months

## 2015-06-17 DIAGNOSIS — G40909 Epilepsy, unspecified, not intractable, without status epilepticus: Secondary | ICD-10-CM

## 2015-06-17 LAB — VALPROIC ACID LEVEL

## 2015-06-17 NOTE — Progress Notes (Signed)
Dr Katrinka Blazing made aware that lab called after pt discharged at this current time to report that pts valproic acid level <10, previous report of 98 incorrect, MD acknowledged,  no new ordered

## 2015-06-17 NOTE — Discharge Summary (Signed)
[facility] [place] at [facility]   PATIENT NAME: [name]    MR#:  [id number]  DATE OF BIRTH:  November 20, 1965  DATE OF ADMISSION:  06/14/2015 ADMITTING PHYSICIAN: Arnaldo Natal, MD  DATE OF DISCHARGE: 06/15/2015  7:13 PM  PRIMARY CARE PHYSICIAN: Sherrie Mustache, MD    ADMISSION DIAGNOSIS:  Facial numbness [R20.8] Left-sided weakness [M62.89] Acute nonintractable headache, unspecified headache type [R51]  DISCHARGE DIAGNOSIS:  Active Problems:   Complicated migraine   Epilepsy   SECONDARY DIAGNOSIS:   Past Medical History  Diagnosis Date  . HTN (hypertension)   . Diabetes   . Hyperlipidemia   . Depression   . Stroke      ADMITTING HISTORY  Chief Complaint: Headache HPI: The patient presented to emergency department via EMS complaining of headache. The headache began in her occiput and awoke her from sleep. She denies a thunderclap sensation but felt as if the headache or squeezing the back of her skull. She states that her usual headaches which are a chronic complaint are in her temples and on her left ample more frequently than the right temple. She admits to associated nausea and states that her chest hurt at the time she was awakened by the headache but does not hurt now. She denies visual changes associated with the headache but states that her vision has become worse since her stroke. She also states that the left side of her face feels numb and was tingly at one time. She states that the severity of the headache at onset was 10 out of 10 and is now 8 out of 10 after Toradol Reglan and Benadryl. NIH stroke scale 7 on presentation. Due to her history of cerebrovascular accident the emergency department staff called for admission.   HOSPITAL COURSE:   1. Epilepsy- This is cause of headaches and fluctuating weakness; proven by EEG, Temporal waves. 2. Headaches- These are secondary to 1. - Continue Depakote  BID - PRN Fioricet for  headaches - No driving or operating heavy machinery x 6 months - Needs to f/u with [facility] in 3 months 3.  Diabetes mellitus, uncontrolled  Started on insulin in the hospital. Prescriptions given for Levemir 20 units daily. Patient is to check her blood sugars 4 times daily.   Stable for discharge home. Patient's headache has resolved. No seizures.   CONSULTS OBTAINED:  Treatment Team:  Mellody Drown, MD  DRUG ALLERGIES:  No Known Allergies  DISCHARGE MEDICATIONS:   Discharge Medication List as of 06/15/2015  6:28 PM    START taking these medications   Details  Insulin Detemir (LEVEMIR) 100 UNIT/ML Pen Inject 20 Units into the skin daily at 10 pm., Starting 06/15/2015, Until Discontinued, Print      CONTINUE these medications which have CHANGED   Details  butalbital-acetaminophen-caffeine (FIORICET) 50-325-40 MG tablet Take 1-2 tablets by mouth every 6 (six) hours as needed for headache., Starting 06/15/2015, Until Thu 06/14/16, Print    valproic acid (DEPAKENE) 250 MG capsule Take 2 capsules (500 mg total) by mouth 2 (two) times daily., Starting 06/15/2015, Until Discontinued, Print      CONTINUE these medications which have NOT CHANGED   Details  amoxicillin-clavulanate (AUGMENTIN) 875-125 MG per tablet Take 1 tablet by mouth 2 (two) times daily., Until Discontinued, Historical Med    aspirin EC 325 MG EC tablet Take 1 tablet (325 mg total) by mouth daily., Starting 03/19/2014, Until Discontinued, Normal    Iron TABS Take 1 tablet  by mouth daily. Strength unknown. otc., Until Discontinued, Historical Med    Liraglutide (VICTOZA) 18 MG/3ML SOPN Inject 1 Dose into the skin daily., Starting 01/19/2014, Until Discontinued, Historical Med    lisinopril-hydrochlorothiazide (PRINZIDE,ZESTORETIC) 20-12.5 MG per tablet Take 1 tablet by mouth daily., Starting 04/19/2015, Until Discontinued, Print    metFORMIN (GLUCOPHAGE) 1000 MG tablet Take 1,000 mg by mouth 2 (two) times daily with a  meal., Until Discontinued, Historical Med    oxyCODONE-acetaminophen (PERCOCET/ROXICET) 5-325 MG per tablet Take 1 tablet by mouth every 6 (six) hours as needed., Starting 01/13/2014, Until Discontinued, Historical Med    rosuvastatin (CRESTOR) 10 MG tablet Take 10 mg by mouth daily., Until Discontinued, Historical Med      STOP taking these medications     glipiZIDE (GLUCOTROL XL) 5 MG 24 hr tablet          Today    VITAL SIGNS:  Blood pressure 132/79, pulse 95, temperature 98 F (36.7 C), temperature source Oral, resp. rate 18, height  (1.575 m), weight 113.535 kg (250 lb 4.8 oz), last menstrual period 06/06/2015, SpO2 97 %.  I/O:  No intake or output data in the 24 hours ending 06/17/15 1441  PHYSICAL EXAMINATION:  Physical Exam  GENERAL:  49 y.o.-year-old patient lying in the bed with no acute distress.  LUNGS: Normal breath sounds bilaterally, no wheezing, rales,rhonchi or crepitation. No use of accessory muscles of respiration.  CARDIOVASCULAR: S1, S2 normal. No murmurs, rubs, or gallops.  ABDOMEN: Soft, non-tender, non-distended. Bowel sounds present. No organomegaly or mass.  NEUROLOGIC: Moves all 4 extremities.  Chronic left sided weakness PSYCHIATRIC: The patient is alert and oriented x 3.  SKIN: No obvious rash, lesion, or ulcer.   DATA REVIEW:   CBC  Recent Labs Lab 06/14/15 0316  WBC 6.4  HGB 10.5*  HCT 33.9*  PLT 172    Chemistries   Recent Labs Lab 06/14/15 0316  NA 137  K 3.5  CL 99*  CO2 28  GLUCOSE 369*  BUN 14  CREATININE 0.69  CALCIUM 9.0  AST 18  ALT 21  ALKPHOS 52  BILITOT 0.5    Cardiac Enzymes  Recent Labs Lab 06/14/15 0316  TROPONINI <0.03    Microbiology Results  Results for orders placed or performed during the hospital encounter of 03/17/14  MRSA PCR Screening     Status: None   Collection Time: 03/17/14  2:05 PM  Result Value Ref Range Status   MRSA by PCR NEGATIVE NEGATIVE Final    Comment:        The  GeneXpert MRSA Assay (FDA approved for NASAL specimens only), is one component of a comprehensive MRSA colonization surveillance program. It is not intended to diagnose MRSA infection nor to guide or monitor treatment for MRSA infections.    RADIOLOGY:  No results found.    Follow up with PCP in 1 week.  Management plans discussed with the patient, family and they are in agreement.  CODE STATUS:   TOTAL TIME TAKING CARE OF THIS PATIENT ON DAY OF DISCHARGE: more than 30 minutes.    Milagros Loll R M.D on 06/17/2015 at 2:41 PM  Between 7am to 6pm - Pager - [phone]  After 6pm go to [email or web address] - password [email or web address]  [facility]  Office  [phone]  CC: Primary care physician; Sherrie Mustache, MD     Note: This dictation was prepared with Dragon dictation along with smaller phrase technology. Any transcriptional errors that result  from this process are unintentional.

## 2015-08-14 ENCOUNTER — Encounter: Payer: Self-pay | Admitting: Emergency Medicine

## 2015-08-14 ENCOUNTER — Emergency Department
Admission: EM | Admit: 2015-08-14 | Discharge: 2015-08-14 | Disposition: A | Discharge status: Home / Self Care | Payer: BLUE CROSS/BLUE SHIELD | Attending: Emergency Medicine | Admitting: Emergency Medicine

## 2015-08-14 ENCOUNTER — Emergency Department: Payer: BLUE CROSS/BLUE SHIELD

## 2015-08-14 DIAGNOSIS — J40 Bronchitis, not specified as acute or chronic: Secondary | ICD-10-CM | POA: Diagnosis not present

## 2015-08-14 DIAGNOSIS — R0602 Shortness of breath: Secondary | ICD-10-CM | POA: Diagnosis present

## 2015-08-14 DIAGNOSIS — Z792 Long term (current) use of antibiotics: Secondary | ICD-10-CM | POA: Diagnosis not present

## 2015-08-14 DIAGNOSIS — R42 Dizziness and giddiness: Secondary | ICD-10-CM | POA: Diagnosis not present

## 2015-08-14 DIAGNOSIS — Z7982 Long term (current) use of aspirin: Secondary | ICD-10-CM | POA: Insufficient documentation

## 2015-08-14 DIAGNOSIS — E119 Type 2 diabetes mellitus without complications: Secondary | ICD-10-CM | POA: Diagnosis not present

## 2015-08-14 DIAGNOSIS — I10 Essential (primary) hypertension: Secondary | ICD-10-CM | POA: Diagnosis not present

## 2015-08-14 DIAGNOSIS — Z7984 Long term (current) use of oral hypoglycemic drugs: Secondary | ICD-10-CM | POA: Diagnosis not present

## 2015-08-14 DIAGNOSIS — Z794 Long term (current) use of insulin: Secondary | ICD-10-CM | POA: Diagnosis not present

## 2015-08-14 HISTORY — DX: Epilepsy, unspecified, not intractable, without status epilepticus: G40.909

## 2015-08-14 MED ORDER — IPRATROPIUM-ALBUTEROL 0.5-2.5 (3) MG/3ML IN SOLN
3.0000 mL | Freq: Once | RESPIRATORY_TRACT | Status: AC
  Administered 2015-08-14: 3 mL via RESPIRATORY_TRACT
  Filled 2015-08-14: qty 3

## 2015-08-14 MED ORDER — ALBUTEROL SULFATE HFA 108 (90 BASE) MCG/ACT IN AERS: 2.0000 | INHALATION_SPRAY | Freq: Four times a day (QID) | RESPIRATORY_TRACT | Status: DC | PRN

## 2015-08-14 NOTE — ED Provider Notes (Signed)
[facility] Emergency Department Provider Note  ____________________________________________  Time seen: 11 aM  I have reviewed the triage vital signs and the nursing notes.   HISTORY  Chief Complaint Shortness of Breath and Dizziness    HPI [name] is a 49 y.o. female who presents with cold symptoms for approximately one week with a sense of chest congestion and shortness of breath today. She does report a nonproductive cough for approximately one week. She does not smoke. No history of asthma. She felt slightly dizzy yesterday but does not feel that way today. No recent travel or calf pain or swelling. No fevers chills.     Past Medical History  Diagnosis Date  . HTN (hypertension)   . Diabetes (HCC)   . Hyperlipidemia   . Depression   . Stroke (HCC)   . Epilepsy [facility])     Patient Active Problem List   Diagnosis Date Noted  . Epilepsy (HCC) 06/17/2015  . 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 Headache 05/19/2015  . Weakness of left side of body 05/19/2015  . H/O: CVA (cerebrovascular accident) 05/19/2015  . Headache(784.0) 03/19/2014  . Severe obesity (BMI >= 40) (HCC) 03/19/2014  . HTN (hypertension)   . Diabetes (HCC)   . Hyperlipidemia   . [name] (transient ischemic attack) 03/17/2014    Past Surgical History  Procedure Laterality Date  . Cesarean section      Current Outpatient Rx  Name  Route  Sig  Dispense  Refill  . albuterol (PROVENTIL HFA;VENTOLIN HFA) 108 (90 BASE) MCG/ACT inhaler   Inhalation   Inhale 2 puffs into the lungs every 6 (six) hours as needed for wheezing or shortness of breath.   1 Inhaler   2   . amoxicillin-clavulanate (AUGMENTIN) 875-125 MG per tablet   Oral   Take 1 tablet by mouth 2 (two) times daily.         Marland Kitchen aspirin EC 325 MG EC tablet   Oral   Take 1 tablet (325 mg total) by mouth daily.   30 tablet   0   . butalbital-acetaminophen-caffeine (FIORICET) 50-325-40 MG tablet   Oral   Take 1-2  tablets by mouth every 6 (six) hours as needed for headache.   30 tablet   0   . Insulin Detemir (LEVEMIR) 100 UNIT/ML Pen   Subcutaneous   Inject 20 Units into the skin daily at 10 pm.   15 mL   11   . Iron TABS   Oral   Take 1 tablet by mouth daily. Strength unknown. otc.         . Liraglutide (VICTOZA) 18 MG/3ML SOPN   Subcutaneous   Inject 1 Dose into the skin daily.         Marland Kitchen lisinopril-hydrochlorothiazide (PRINZIDE,ZESTORETIC) 20-12.5 MG per tablet   Oral   Take 1 tablet by mouth daily.   30 tablet   1   . metFORMIN (GLUCOPHAGE) 1000 MG tablet   Oral   Take 1,000 mg by mouth 2 (two) times daily with a meal.         . oxyCODONE-acetaminophen (PERCOCET/ROXICET) 5-325 MG per tablet   Oral   Take 1 tablet by mouth every 6 (six) hours as needed.         . rosuvastatin (CRESTOR) 10 MG tablet   Oral   Take 10 mg by mouth daily.         Marland Kitchen valproic acid (DEPAKENE) 250 MG capsule   Oral  Take 2 capsules (500 mg total) by mouth 2 (two) times daily.   120 capsule   0     Allergies Review of patient's allergies indicates no known allergies.  Family History  Problem Relation Age of Onset  . Hypertension Mother   . Hypertension Father   . Diabetes Mellitus II Mother     Social History Social History  Substance Use Topics  . Smoking status: Never Smoker   . Smokeless tobacco: None  . Alcohol Use: No    Review of Systems  Constitutional: Negative for fever. Eyes: Negative for visual changes. ENT: Negative for sore throat Cardiovascular: Negative for chest pain. Respiratory: Positive for cough Gastrointestinal: Negative for abdominal pain, vomiting and diarrhea. Genitourinary: Negative for dysuria. Musculoskeletal: Negative for back pain. Skin: Negative for rash. Neurological: Negative for headaches or focal weakness Psychiatric: No anxiety  ____________________________________________   PHYSICAL EXAM:  VITAL SIGNS: ED Triage Vitals   Enc Vitals Group     BP 08/14/15 0907 151/81 mmHg     Pulse Rate 08/14/15 0907 97     Resp 08/14/15 0907 18     Temp 08/14/15 0907 98.6 F (37 C)     Temp Source 08/14/15 0907 Oral     SpO2 08/14/15 0907 95 %     Weight 08/14/15 0907 229 lb (103.874 kg)     Height 08/14/15 0907  (1.575 m)     Head Cir --      Peak Flow --      Pain Score 08/14/15 0908 8     Pain Loc --      Pain Edu? --      Excl. in GC? --     Constitutional: Alert and oriented. Well appearing and in no distress. Eyes: Conjunctivae are normal.  ENT   Head: Normocephalic and atraumatic.   Mouth/Throat: Mucous membranes are moist. Cardiovascular: Normal rate, regular rhythm. Normal and symmetric distal pulses are present in all extremities. No murmurs, rubs, or gallops. Respiratory: Normal respiratory effort without tachypnea nor retractions. Scattered wheezes Gastrointestinal: Soft and non-tender in all quadrants. No distention. There is no CVA tenderness. Genitourinary: deferred Musculoskeletal: Nontender with normal range of motion in all extremities. No lower extremity tenderness nor edema. Neurologic:  Normal speech and language. No gross focal neurologic deficits are appreciated. Skin:  Skin is warm, dry and intact. No rash noted. Psychiatric: Mood and affect are normal. Patient exhibits appropriate insight and judgment.  ____________________________________________    LABS (pertinent positives/negatives)  Labs Reviewed - No data to display  ____________________________________________   EKG  ED ECG REPORT I, Jene Every, the attending physician, personally viewed and interpreted this ECG.  Date: 08/14/2015 EKG Time: 9:03 AM Rate: 96 Rhythm: normal sinus rhythm QRS Axis: normal Intervals: normal ST/T Wave abnormalities: normal Conduction Disutrbances: none Narrative Interpretation: unremarkable   ____________________________________________    RADIOLOGY I have  personally reviewed any xrays that were ordered on this patient: Chest x-ray unremarkable  ____________________________________________   PROCEDURES  Procedure(s) performed: none  Critical Care performed: none  ____________________________________________   INITIAL IMPRESSION / ASSESSMENT AND PLAN / ED COURSE  Pertinent labs & imaging results that were available during my care of the patient were reviewed by me and considered in my medical decision making (see chart for details).  Patient well-appearing and in no distress. Scattered wheezes on exam resolved with DuoNeb treatment which made her feel much better. Her chest x-ray is unremarkable. The patient did not want to have blood  work done which given her unremarkable vitals I think is reasonable. Given her diabetes will not treat with steroids but I did provide an inhaler for her to use as needed. She is to return if any worsening of her symptoms.  ____________________________________________   FINAL CLINICAL IMPRESSION(S) / ED DIAGNOSES  Final diagnoses:  Bronchitis     Jene Everyobert Mala Gibbard, MD 08/14/15 [phone]

## 2015-08-14 NOTE — ED Notes (Signed)
Patient presents to the ED with shortness of breath that began yesterday evening and dizziness that began yesterday morning.  Patient reports chest pain when she coughs.  Patient is speaking in full sentences without obvious difficulty.  Reports cough x 1 week.

## 2015-08-14 NOTE — Discharge Instructions (Signed)
Upper Respiratory Infection, Adult Most upper respiratory infections (URIs) are a viral infection of the air passages leading to the lungs. A URI affects the nose, throat, and upper air passages. The most common type of URI is nasopharyngitis and is typically referred to as "the common cold." URIs run their course and usually go away on their own. Most of the time, a URI does not require medical attention, but sometimes a bacterial infection in the upper airways can follow a viral infection. This is called a secondary infection. Sinus and middle ear infections are common types of secondary upper respiratory infections. Bacterial pneumonia can also complicate a URI. A URI can worsen asthma and chronic obstructive pulmonary disease (COPD). Sometimes, these complications can require emergency medical care and may be life threatening.  CAUSES Almost all URIs are caused by viruses. A virus is a type of germ and can spread from one person to another.  RISKS FACTORS You may be at risk for a URI if:   You smoke.   You have chronic heart or lung disease.  You have a weakened defense (immune) system.   You are very young or very old.   You have nasal allergies or asthma.  You work in crowded or poorly ventilated areas.  You work in health care facilities or schools. SIGNS AND SYMPTOMS  Symptoms typically develop 2-3 days after you come in contact with a cold virus. Most viral URIs last 7-10 days. However, viral URIs from the influenza virus (flu virus) can last 14-18 days and are typically more severe. Symptoms may include:   Runny or stuffy (congested) nose.   Sneezing.   Cough.   Sore throat.   Headache.   Fatigue.   Fever.   Loss of appetite.   Pain in your forehead, behind your eyes, and over your cheekbones (sinus pain).  Muscle aches.  DIAGNOSIS  Your health care provider may diagnose a URI by:  Physical exam.  Tests to check that your symptoms are not due to  another condition such as:  Strep throat.  Sinusitis.  Pneumonia.  Asthma. TREATMENT  A URI goes away on its own with time. It cannot be cured with medicines, but medicines may be prescribed or recommended to relieve symptoms. Medicines may help:  Reduce your fever.  Reduce your cough.  Relieve nasal congestion. HOME CARE INSTRUCTIONS   Take medicines only as directed by your health care provider.   Gargle warm saltwater or take cough drops to comfort your throat as directed by your health care provider.  Use a warm mist humidifier or inhale steam from a shower to increase air moisture. This may make it easier to breathe.  Drink enough fluid to keep your urine clear or pale yellow.   Eat soups and other clear broths and maintain good nutrition.   Rest as needed.   Return to work when your temperature has returned to normal or as your health care provider advises. You may need to stay home longer to avoid infecting others. You can also use a face mask and careful hand washing to prevent spread of the virus.  Increase the usage of your inhaler if you have asthma.   Do not use any tobacco products, including cigarettes, chewing tobacco, or electronic cigarettes. If you need help quitting, ask your health care provider. PREVENTION  The best way to protect yourself from getting a cold is to practice good hygiene.   Avoid oral or hand contact with people with cold   symptoms.   Wash your hands often if contact occurs.  There is no clear evidence that vitamin C, vitamin E, echinacea, or exercise reduces the chance of developing a cold. However, it is always recommended to get plenty of rest, exercise, and practice good nutrition.  SEEK MEDICAL CARE IF:   You are getting worse rather than better.   Your symptoms are not controlled by medicine.   You have chills.  You have worsening shortness of breath.  You have brown or red mucus.  You have yellow or brown nasal  discharge.  You have pain in your face, especially when you bend forward.  You have a fever.  You have swollen neck glands.  You have pain while swallowing.  You have white areas in the back of your throat. SEEK IMMEDIATE MEDICAL CARE IF:   You have severe or persistent:  Headache.  Ear pain.  Sinus pain.  Chest pain.  You have chronic lung disease and any of the following:  Wheezing.  Prolonged cough.  Coughing up blood.  A change in your usual mucus.  You have a stiff neck.  You have changes in your:  Vision.  Hearing.  Thinking.  Mood. MAKE SURE YOU:   Understand these instructions.  Will watch your condition.  Will get help right away if you are not doing well or get worse.   This information is not intended to replace advice given to you by your health care provider. Make sure you discuss any questions you have with your health care provider.   Document Released: 02/27/2001 Document Revised: 01/18/2015 Document Reviewed: 12/09/2013 Elsevier Interactive Patient Education 2016 Elsevier Inc.  

## 2015-10-20 ENCOUNTER — Ambulatory Visit: Payer: BLUE CROSS/BLUE SHIELD | Admitting: Family Medicine

## 2015-10-26 ENCOUNTER — Ambulatory Visit: Payer: BLUE CROSS/BLUE SHIELD | Admitting: Family Medicine

## 2015-10-27 ENCOUNTER — Emergency Department
Admission: EM | Admit: 2015-10-27 | Discharge: 2015-10-27 | Disposition: A | Discharge status: Home / Self Care | Payer: BLUE CROSS/BLUE SHIELD | Attending: Emergency Medicine | Admitting: Emergency Medicine

## 2015-10-27 DIAGNOSIS — E860 Dehydration: Secondary | ICD-10-CM | POA: Diagnosis not present

## 2015-10-27 DIAGNOSIS — R42 Dizziness and giddiness: Secondary | ICD-10-CM

## 2015-10-27 DIAGNOSIS — E1165 Type 2 diabetes mellitus with hyperglycemia: Secondary | ICD-10-CM | POA: Insufficient documentation

## 2015-10-27 DIAGNOSIS — I10 Essential (primary) hypertension: Secondary | ICD-10-CM | POA: Insufficient documentation

## 2015-10-27 LAB — BASIC METABOLIC PANEL
Anion gap: 8 (ref 5–15)
BUN: 11 mg/dL (ref 6–20)
CALCIUM: 9.1 mg/dL (ref 8.9–10.3)
CO2: 27 mmol/L (ref 22–32)
CREATININE: 0.75 mg/dL (ref 0.44–1.00)
Chloride: 101 mmol/L (ref 101–111)
GFR calc Af Amer: >60 mL/min (ref >60)
GLUCOSE: 483 mg/dL — AB (ref 65–99)
Potassium: 4 mmol/L (ref 3.5–5.1)
SODIUM: 136 mmol/L (ref 135–145)

## 2015-10-27 LAB — URINALYSIS COMPLETE WITH MICROSCOPIC (ARMC ONLY)
Bacteria, UA: NONE SEEN
Bilirubin Urine: NEGATIVE
Ketones, ur: NEGATIVE mg/dL
LEUKOCYTES UA: NEGATIVE
NITRITE: NEGATIVE
PROTEIN: NEGATIVE mg/dL
SPECIFIC GRAVITY, URINE: 1.035 — AB (ref 1.005–1.030)
pH: 6 (ref 5.0–8.0)

## 2015-10-27 LAB — CBC
HCT: 38.8 % (ref 35.0–47.0)
Hemoglobin: 12 g/dL (ref 12.0–16.0)
MCH: 19.3 pg — ABNORMAL LOW (ref 26.0–34.0)
MCHC: 31 g/dL — AB (ref 32.0–36.0)
MCV: 62.4 fL — ABNORMAL LOW (ref 80.0–100.0)
PLATELETS: 176 10*3/uL (ref 150–440)
RBC: 6.21 MIL/uL — ABNORMAL HIGH (ref 3.80–5.20)
RDW: 19.5 % — AB (ref 11.5–14.5)
WBC: 7.5 10*3/uL (ref 3.6–11.0)

## 2015-10-27 LAB — GLUCOSE, CAPILLARY: Glucose-Capillary: 355 mg/dL — ABNORMAL HIGH (ref 65–99)

## 2015-10-27 LAB — TROPONIN I: TROPONIN I: 0.04 ng/mL — AB (ref <0.031)

## 2015-10-27 MED ORDER — SODIUM CHLORIDE 0.9 % IV SOLN
Freq: Once | INTRAVENOUS | Status: AC
  Administered 2015-10-27: 22:00:00 via INTRAVENOUS

## 2015-10-27 MED ORDER — INSULIN ASPART 100 UNIT/ML ~~LOC~~ SOLN
SUBCUTANEOUS | Status: AC
  Administered 2015-10-27: 5 [IU] via SUBCUTANEOUS
  Filled 2015-10-27: qty 5

## 2015-10-27 MED ORDER — INSULIN ASPART 100 UNIT/ML ~~LOC~~ SOLN
5.0000 [IU] | Freq: Once | SUBCUTANEOUS | Status: AC
  Administered 2015-10-27: 5 [IU] via SUBCUTANEOUS

## 2015-10-27 NOTE — ED Provider Notes (Signed)
[facility] Emergency Department Provider Note     Time seen: ----------------------------------------- 9:25 PM on 10/27/2015 -----------------------------------------    I have reviewed the triage vital signs and the nursing notes.   HISTORY  Chief Complaint Dizziness    HPI [name] is a 50 y.o. female who presents ER for dizziness today and headache. Patient states she fell due to dizziness, is not sure how high her blood sugars have been. Patient doesn't think she's been eating or drinking very much, denies nausea vomiting or diarrhea.   Past Medical History  Diagnosis Date  . HTN (hypertension)   . Diabetes (HCC)   . Hyperlipidemia   . Depression   . Stroke (HCC)   . Epilepsy [facility])     Patient Active Problem List   Diagnosis Date Noted  . Epilepsy (HCC) 06/17/2015  . 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 Headache 05/19/2015  . Weakness of left side of body 05/19/2015  . H/O: CVA (cerebrovascular accident) 05/19/2015  . Headache(784.0) 03/19/2014  . Severe obesity (BMI >= 40) (HCC) 03/19/2014  . HTN (hypertension)   . Diabetes (HCC)   . Hyperlipidemia   . [name] (transient ischemic attack) 03/17/2014    Past Surgical History  Procedure Laterality Date  . Cesarean section      Allergies Review of patient's allergies indicates no known allergies.  Social History Social History  Substance Use Topics  . Smoking status: Never Smoker   . Smokeless tobacco: Not on file  . Alcohol Use: No    Review of Systems Constitutional: Negative for fever. Eyes: Negative for visual changes. ENT: Negative for sore throat. Cardiovascular: Negative for chest pain. Respiratory: Negative for shortness of breath. Gastrointestinal: Negative for abdominal pain, vomiting and diarrhea. Genitourinary: Negative for dysuria. Musculoskeletal: Negative for back pain. Skin: Negative for rash. Neurological: Positive for headache and dizziness  10-point  ROS otherwise negative.  ____________________________________________   PHYSICAL EXAM:  VITAL SIGNS: ED Triage Vitals  Enc Vitals Group     BP 10/27/15 2040 190/95 mmHg     Pulse Rate 10/27/15 2039 95     Resp 10/27/15 2039 18     Temp 10/27/15 2039 97.6 F (36.4 C)     Temp Source 10/27/15 2039 Oral     SpO2 10/27/15 2039 98 %     Weight 10/27/15 2039 230 lb (104.327 kg)     Height 10/27/15 2039  (1.575 m)     Head Cir --      Peak Flow --      Pain Score 10/27/15 2039 8     Pain Loc --      Pain Edu? --      Excl. in GC? --     Constitutional: Alert and oriented. No acute distress Eyes: Conjunctivae are normal. PERRL. disconjugate gaze ENT   Head: Normocephalic and atraumatic.   Nose: No congestion/rhinnorhea.   Mouth/Throat: Mucous membranes are moist.   Neck: No stridor. Cardiovascular: Normal rate, regular rhythm. Normal and symmetric distal pulses are present in all extremities. No murmurs, rubs, or gallops. Respiratory: Normal respiratory effort without tachypnea nor retractions. Breath sounds are clear and equal bilaterally. No wheezes/rales/rhonchi. Gastrointestinal: Soft and nontender. No distention. No abdominal bruits.  Musculoskeletal: Nontender with normal range of motion in all extremities. No joint effusions.  No lower extremity tenderness nor edema. Neurologic:  Normal speech and language. No gross focal neurologic deficits are appreciated. Speech is normal. No gait instability. Skin:  Skin is warm, dry  and intact. No rash noted. Psychiatric: Mood and affect are normal. Speech and behavior are normal. Patient exhibits appropriate insight and judgment. ____________________________________________  EKG: Interpreted by me. Normal sinus rhythm with rate of 94 bpm, normal PR interval, normal QRS, normal QT interval. T-wave inversions inferior laterally  ____________________________________________  ED COURSE:  Pertinent labs & imaging  results that were available during my care of the patient were reviewed by me and considered in my medical decision making (see chart for details). Patient is likely dehydrated and/or suffering the effects of hyperglycemia. I will give fluids and evaluate her lab work. ____________________________________________    Vickie Epley (pertinent positives/negatives)  Labs Reviewed  BASIC METABOLIC PANEL - Abnormal; Notable for the following:    Glucose, Bld 483 (*)    All other components within normal limits  CBC - Abnormal; Notable for the following:    RBC 6.21 (*)    MCV 62.4 (*)    MCH 19.3 (*)    MCHC 31.0 (*)    RDW 19.5 (*)    All other components within normal limits  URINALYSIS COMPLETEWITH MICROSCOPIC ([facility] ONLY) - Abnormal; Notable for the following:    Color, Urine STRAW (*)    APPearance CLEAR (*)    Glucose, UA >500 (*)    Specific Gravity, Urine 1.035 (*)    Hgb urine dipstick 1+ (*)    Squamous Epithelial / LPF 0-5 (*)    All other components within normal limits  TROPONIN I - Abnormal; Notable for the following:    Troponin I 0.04 (*)    All other components within normal limits   ____________________________________________  FINAL ASSESSMENT AND PLAN  Dizziness, hyperglycemia  Plan: Patient with labs and imaging as dictated above. Patient was given a liter of saline as well as subcutaneous insulin. Her dizziness seems to be secondary to poor glycemic control. She admitted freely she is not checking her blood sugar as directed. Should be advised to follow-up with her doctor tomorrow for recheck.   Emily Filbert, MD   Emily Filbert, MD 10/27/15 [phone]

## 2015-10-27 NOTE — Discharge Instructions (Signed)
Dehydration, Adult Dehydration is a condition in which you do not have enough fluid or water in your body. It happens when you take in less fluid than you lose. Vital organs such as the kidneys, brain, and heart cannot function without a proper amount of fluids. Any loss of fluids from the body can cause dehydration.  Dehydration can range from mild to severe. This condition should be treated right away to help prevent it from becoming severe. CAUSES  This condition may be caused by:  Vomiting.  Diarrhea.  Excessive sweating, such as when exercising in hot or humid weather.  Not drinking enough fluid during strenuous exercise or during an illness.  Excessive urine output.  Fever.  Certain medicines. RISK FACTORS This condition is more likely to develop in:  People who are taking certain medicines that cause the body to lose excess fluid (diuretics).   People who have a chronic illness, such as diabetes, that may increase urination.  Older adults.   People who live at high altitudes.   People who participate in endurance sports.  SYMPTOMS  Mild Dehydration  Thirst.  Dry lips.  Slightly dry mouth.  Dry, warm skin. Moderate Dehydration  Very dry mouth.   Muscle cramps.   Dark urine and decreased urine production.   Decreased tear production.   Headache.   Light-headedness, especially when you stand up from a sitting position.  Severe Dehydration  Changes in skin.   Cold and clammy skin.   Skin does not spring back quickly when lightly pinched and released.   Changes in body fluids.   Extreme thirst.   No tears.   Not able to sweat when body temperature is high, such as in hot weather.   Minimal urine production.   Changes in vital signs.   Rapid, weak pulse (more than 100 beats per minute when you are sitting still).   Rapid breathing.   Low blood pressure.   Other changes.   Sunken eyes.   Cold hands and feet.    Confusion.  Lethargy and difficulty being awakened.  Fainting (syncope).   Short-term weight loss.   Unconsciousness. DIAGNOSIS  This condition may be diagnosed based on your symptoms. You may also have tests to determine how severe your dehydration is. These tests may include:   Urine tests.   Blood tests.  TREATMENT  Treatment for this condition depends on the severity. Mild or moderate dehydration can often be treated at home. Treatment should be started right away. Do not wait until dehydration becomes severe. Severe dehydration needs to be treated at the hospital. Treatment for Mild Dehydration  Drinking plenty of water to replace the fluid you have lost.   Replacing minerals in your blood (electrolytes) that you may have lost.  Treatment for Moderate Dehydration  Consuming oral rehydration solution (ORS). Treatment for Severe Dehydration  Receiving fluid through an IV tube.   Receiving electrolyte solution through a feeding tube that is passed through your nose and into your stomach (nasogastric tube or NG tube).  Correcting any abnormalities in electrolytes. HOME CARE INSTRUCTIONS   Drink enough fluid to keep your urine clear or pale yellow.   Drink water or fluid slowly by taking small sips. You can also try sucking on ice cubes.  Have food or beverages that contain electrolytes. Examples include bananas and sports drinks.  Take over-the-counter and prescription medicines only as told by your health care provider.   Prepare ORS according to the manufacturer's instructions. Take sips  of ORS every 5 minutes until your urine returns to normal.  If you have vomiting or diarrhea, continue to try to drink water, ORS, or both.   If you have diarrhea, avoid:   Beverages that contain caffeine.   Fruit juice.   Milk.   Carbonated soft drinks.  Do not take salt tablets. This can lead to the condition of having too much sodium in your body  (hypernatremia).  SEEK MEDICAL CARE IF:  You cannot eat or drink without vomiting.  You have had moderate diarrhea during a period of more than 24 hours.  You have a fever. SEEK IMMEDIATE MEDICAL CARE IF:   You have extreme thirst.  You have severe diarrhea.  You have not urinated in 6-8 hours, or you have urinated only a small amount of very dark urine.  You have shriveled skin.  You are dizzy, confused, or both.   This information is not intended to replace advice given to you by your health care provider. Make sure you discuss any questions you have with your health care provider.   Document Released: 09/03/2005 Document Revised: 05/25/2015 Document Reviewed: 01/19/2015 Elsevier Interactive Patient Education 2016 [employer].  Diabetes Mellitus and Food It is important for you to manage your blood sugar (glucose) level. Your blood glucose level can be greatly affected by what you eat. Eating healthier foods in the appropriate amounts throughout the day at about the same time each day will help you control your blood glucose level. It can also help slow or prevent worsening of your diabetes mellitus. Healthy eating may even help you improve the level of your blood pressure and reach or maintain a healthy weight.  General recommendations for healthful eating and cooking habits include:  Eating meals and snacks regularly. Avoid going long periods of time without eating to lose weight.  Eating a diet that consists mainly of plant-based foods, such as fruits, vegetables, nuts, legumes, and whole grains.  Using low-heat cooking methods, such as baking, instead of high-heat cooking methods, such as deep frying. Work with your dietitian to make sure you understand how to use the Nutrition Facts information on food labels. HOW CAN FOOD AFFECT ME? Carbohydrates Carbohydrates affect your blood glucose level more than any other type of food. Your dietitian will help you determine how  many carbohydrates to eat at each meal and teach you how to count carbohydrates. Counting carbohydrates is important to keep your blood glucose at a healthy level, especially if you are using insulin or taking certain medicines for diabetes mellitus. Alcohol Alcohol can cause sudden decreases in blood glucose (hypoglycemia), especially if you use insulin or take certain medicines for diabetes mellitus. Hypoglycemia can be a life-threatening condition. Symptoms of hypoglycemia (sleepiness, dizziness, and disorientation) are similar to symptoms of having too much alcohol.  If your health care provider has given you approval to drink alcohol, do so in moderation and use the following guidelines:  Women should not have more than one drink per day, and men should not have more than two drinks per day. One drink is equal to:  12 oz of beer.  5 oz of wine.  1 oz of hard liquor.  Do not drink on an empty stomach.  Keep yourself hydrated. Have water, diet soda, or unsweetened iced tea.  Regular soda, juice, and other mixers might contain a lot of carbohydrates and should be counted. WHAT FOODS ARE NOT RECOMMENDED? As you make food choices, it is important to remember that  all foods are not the same. Some foods have fewer nutrients per serving than other foods, even though they might have the same number of calories or carbohydrates. It is difficult to get your body what it needs when you eat foods with fewer nutrients. Examples of foods that you should avoid that are high in calories and carbohydrates but low in nutrients include:  Trans fats (most processed foods list trans fats on the Nutrition Facts label).  Regular soda.  Juice.  Candy.  Sweets, such as cake, pie, doughnuts, and cookies.  Fried foods. WHAT FOODS CAN I EAT? Eat nutrient-rich foods, which will nourish your body and keep you healthy. The food you should eat also will depend on several factors, including:  The calories you  need.  The medicines you take.  Your weight.  Your blood glucose level.  Your blood pressure level.  Your cholesterol level. You should eat a variety of foods, including:  Protein.  Lean cuts of meat.  Proteins low in saturated fats, such as fish, egg whites, and beans. Avoid processed meats.  Fruits and vegetables.  Fruits and vegetables that may help control blood glucose levels, such as apples, mangoes, and yams.  Dairy products.  Choose fat-free or low-fat dairy products, such as milk, yogurt, and cheese.  Grains, bread, pasta, and rice.  Choose whole grain products, such as multigrain bread, whole oats, and brown rice. These foods may help control blood pressure.  Fats.  Foods containing healthful fats, such as nuts, avocado, olive oil, canola oil, and fish. DOES EVERYONE WITH DIABETES MELLITUS HAVE THE SAME MEAL PLAN? Because every person with diabetes mellitus is different, there is not one meal plan that works for everyone. It is very important that you meet with a dietitian who will help you create a meal plan that is just right for you.   This information is not intended to replace advice given to you by your health care provider. Make sure you discuss any questions you have with your health care provider.   Document Released: 05/31/2005 Document Revised: 09/24/2014 Document Reviewed: 07/31/2013 Elsevier Interactive Patient Education 2016 Elsevier Inc.  Dizziness Dizziness is a common problem. It is a feeling of unsteadiness or light-headedness. You may feel like you are about to faint. Dizziness can lead to injury if you stumble or fall. Anyone can become dizzy, but dizziness is more common in older adults. This condition can be caused by a number of things, including medicines, dehydration, or illness. HOME CARE INSTRUCTIONS Taking these steps may help with your condition: Eating and Drinking  Drink enough fluid to keep your urine clear or pale yellow.  This helps to keep you from becoming dehydrated. Try to drink more clear fluids, such as water.  Do not drink alcohol.  Limit your caffeine intake if directed by your health care provider.  Limit your salt intake if directed by your health care provider. Activity  Avoid making quick movements.  Rise slowly from chairs and steady yourself until you feel okay.  In the morning, first sit up on the side of the bed. When you feel okay, stand slowly while you hold onto something until you know that your balance is fine.  Move your legs often if you need to stand in one place for a long time. Tighten and relax your muscles in your legs while you are standing.  Do not drive or operate heavy machinery if you feel dizzy.  Avoid bending down if you feel dizzy.  Place items in your home so that they are easy for you to reach without leaning over. Lifestyle  Do not use any tobacco products, including cigarettes, chewing tobacco, or electronic cigarettes. If you need help quitting, ask your health care provider.  Try to reduce your stress level, such as with yoga or meditation. Talk with your health care provider if you need help. General Instructions  Watch your dizziness for any changes.  Take medicines only as directed by your health care provider. Talk with your health care provider if you think that your dizziness is caused by a medicine that you are taking.  Tell a friend or a family member that you are feeling dizzy. If he or she notices any changes in your behavior, have this person call your health care provider.  Keep all follow-up visits as directed by your health care provider. This is important. SEEK MEDICAL CARE IF:  Your dizziness does not go away.  Your dizziness or light-headedness gets worse.  You feel nauseous.  You have reduced hearing.  You have new symptoms.  You are unsteady on your feet or you feel like the room is spinning. SEEK IMMEDIATE MEDICAL CARE  IF:  You vomit or have diarrhea and are unable to eat or drink anything.  You have problems talking, walking, swallowing, or using your arms, hands, or legs.  You feel generally weak.  You are not thinking clearly or you have trouble forming sentences. It may take a friend or family member to notice this.  You have chest pain, abdominal pain, shortness of breath, or sweating.  Your vision changes.  You notice any bleeding.  You have a headache.  You have neck pain or a stiff neck.  You have a fever.   This information is not intended to replace advice given to you by your health care provider. Make sure you discuss any questions you have with your health care provider.   Document Released: 02/27/2001 Document Revised: 01/18/2015 Document Reviewed: 08/30/2014 Elsevier Interactive Patient Education [employer]

## 2015-10-27 NOTE — ED Notes (Signed)
Pt brought back to room 32  - states dizzy today. Still awaiting blood results.

## 2015-10-27 NOTE — ED Notes (Signed)
Pt in with co dizziness hx of the same, no loc.  Pt co headache denies any recent illness.  States due to dizziness she fell but denies injuries.

## 2015-11-04 ENCOUNTER — Emergency Department: Payer: BLUE CROSS/BLUE SHIELD

## 2015-11-04 ENCOUNTER — Emergency Department
Admission: EM | Admit: 2015-11-04 | Discharge: 2015-11-04 | Disposition: A | Discharge status: Home / Self Care | Payer: BLUE CROSS/BLUE SHIELD | Attending: Emergency Medicine | Admitting: Emergency Medicine

## 2015-11-04 DIAGNOSIS — S4992XA Unspecified injury of left shoulder and upper arm, initial encounter: Secondary | ICD-10-CM | POA: Diagnosis present

## 2015-11-04 DIAGNOSIS — S42142A Displaced fracture of glenoid cavity of scapula, left shoulder, initial encounter for closed fracture: Secondary | ICD-10-CM | POA: Insufficient documentation

## 2015-11-04 DIAGNOSIS — R739 Hyperglycemia, unspecified: Secondary | ICD-10-CM

## 2015-11-04 DIAGNOSIS — W01198A Fall on same level from slipping, tripping and stumbling with subsequent striking against other object, initial encounter: Secondary | ICD-10-CM | POA: Insufficient documentation

## 2015-11-04 DIAGNOSIS — E1165 Type 2 diabetes mellitus with hyperglycemia: Secondary | ICD-10-CM | POA: Diagnosis not present

## 2015-11-04 DIAGNOSIS — Y9289 Other specified places as the place of occurrence of the external cause: Secondary | ICD-10-CM | POA: Insufficient documentation

## 2015-11-04 DIAGNOSIS — S42152A Displaced fracture of neck of scapula, left shoulder, initial encounter for closed fracture: Secondary | ICD-10-CM

## 2015-11-04 DIAGNOSIS — Y9389 Activity, other specified: Secondary | ICD-10-CM | POA: Diagnosis not present

## 2015-11-04 DIAGNOSIS — I10 Essential (primary) hypertension: Secondary | ICD-10-CM | POA: Insufficient documentation

## 2015-11-04 DIAGNOSIS — Y998 Other external cause status: Secondary | ICD-10-CM | POA: Insufficient documentation

## 2015-11-04 DIAGNOSIS — W19XXXA Unspecified fall, initial encounter: Secondary | ICD-10-CM

## 2015-11-04 DIAGNOSIS — R42 Dizziness and giddiness: Secondary | ICD-10-CM | POA: Diagnosis not present

## 2015-11-04 LAB — GLUCOSE, CAPILLARY: GLUCOSE-CAPILLARY: 329 mg/dL — AB (ref 65–99)

## 2015-11-04 MED ORDER — INSULIN DETEMIR 100 UNIT/ML ~~LOC~~ SOLN: 25.0000 [IU] | Freq: Every day | SUBCUTANEOUS | Status: DC

## 2015-11-04 MED ORDER — OXYCODONE-ACETAMINOPHEN 5-325 MG PO TABS
2.0000 | ORAL_TABLET | Freq: Once | ORAL | Status: AC
  Administered 2015-11-04: 2 via ORAL
  Filled 2015-11-04: qty 2

## 2015-11-04 MED ORDER — OXYCODONE-ACETAMINOPHEN 5-325 MG PO TABS: 2.0000 | ORAL_TABLET | Freq: Four times a day (QID) | ORAL | Status: DC | PRN

## 2015-11-04 NOTE — ED Notes (Signed)
Patient transported to CT 

## 2015-11-04 NOTE — ED Notes (Signed)
Pt reports hx of 2 previous strokes (2014, 2015), both affecting the left side. Pt also reports visual deficit in right eye due to stroke (pt legally blind in right eye). Pt's pupils are equal and reactive to light. Pt reports hx of diabetes, hypertension. Pt reports she takes daily 325 mg aspirin.

## 2015-11-04 NOTE — Discharge Instructions (Signed)
Blood Glucose Monitoring, Adult Monitoring your blood glucose (also know as blood sugar) helps you to manage your diabetes. It also helps you and your health care provider monitor your diabetes and determine how well your treatment plan is working. WHY SHOULD YOU MONITOR YOUR BLOOD GLUCOSE?  It can help you understand how food, exercise, and medicine affect your blood glucose.  It allows you to know what your blood glucose is at any given moment. You can quickly tell if you are having low blood glucose (hypoglycemia) or high blood glucose (hyperglycemia).  It can help you and your health care provider know how to adjust your medicines.  It can help you understand how to manage an illness or adjust medicine for exercise. WHEN SHOULD YOU TEST? Your health care provider will help you decide how often you should check your blood glucose. This may depend on the type of diabetes you have, your diabetes control, or the types of medicines you are taking. Be sure to write down all of your blood glucose readings so that this information can be reviewed with your health care provider. See below for examples of testing times that your health care provider may suggest. Type 1 Diabetes  Test at least 2 times per day if your diabetes is well controlled, if you are using an insulin pump, or if you perform multiple daily injections.  If your diabetes is not well controlled or if you are sick, you may need to test more often.  It is a good idea to also test:  Before every insulin injection.  Before and after exercise.  Between meals and 2 hours after a meal.  Occasionally between 2:00 a.m. and 3:00 a.m. Type 2 Diabetes  If you are taking insulin, test at least 2 times per day. However, it is best to test before every insulin injection.  If you take medicines by mouth (orally), test 2 times a day.  If you are on a controlled diet, test once a day.  If your diabetes is not well controlled or if you  are sick, you may need to monitor more often. HOW TO MONITOR YOUR BLOOD GLUCOSE Supplies Needed  Blood glucose meter.  Test strips for your meter. Each meter has its own strips. You must use the strips that go with your own meter.  A pricking needle (lancet).  A device that holds the lancet (lancing device).  A journal or log book to write down your results. Procedure  Wash your hands with soap and water. Alcohol is not preferred.  Prick the side of your finger (not the tip) with the lancet.  Gently milk the finger until a small drop of blood appears.  Follow the instructions that come with your meter for inserting the test strip, applying blood to the strip, and using your blood glucose meter. Other Areas to Get Blood for Testing Some meters allow you to use other areas of your body (other than your finger) to test your blood. These areas are called alternative sites. The most common alternative sites are:  The forearm.  The thigh.  The back area of the lower leg.  The palm of the hand. The blood flow in these areas is slower. Therefore, the blood glucose values you get may be delayed, and the numbers are different from what you would get from your fingers. Do not use alternative sites if you think you are having hypoglycemia. Your reading will not be accurate. Always use a finger if you are  having hypoglycemia. Also, if you cannot feel your lows (hypoglycemia unawareness), always use your fingers for your blood glucose checks. ADDITIONAL TIPS FOR GLUCOSE MONITORING  Do not reuse lancets.  Always carry your supplies with you.  All blood glucose meters have a 24-hour "hotline" number to call if you have questions or need help.  Adjust (calibrate) your blood glucose meter with a control solution after finishing a few boxes of strips. BLOOD GLUCOSE RECORD KEEPING It is a good idea to keep a daily record or log of your blood glucose readings. Most glucose meters, if not all,  keep your glucose records stored in the meter. Some meters come with the ability to download your records to your home computer. Keeping a record of your blood glucose readings is especially helpful if you are wanting to look for patterns. Make notes to go along with the blood glucose readings because you might forget what happened at that exact time. Keeping good records helps you and your health care provider to work together to achieve good diabetes management.    This information is not intended to replace advice given to you by your health care provider. Make sure you discuss any questions you have with your health care provider.   Document Released: 09/06/2003 Document Revised: 09/24/2014 Document Reviewed: 01/26/2013 Elsevier Interactive Patient Education 2016 [employer]  Dizziness Dizziness is a common problem. It is a feeling of unsteadiness or light-headedness. You may feel like you are about to faint. Dizziness can lead to injury if you stumble or fall. Anyone can become dizzy, but dizziness is more common in older adults. This condition can be caused by a number of things, including medicines, dehydration, or illness. HOME CARE INSTRUCTIONS Taking these steps may help with your condition: Eating and Drinking  Drink enough fluid to keep your urine clear or pale yellow. This helps to keep you from becoming dehydrated. Try to drink more clear fluids, such as water.  Do not drink alcohol.  Limit your caffeine intake if directed by your health care provider.  Limit your salt intake if directed by your health care provider. Activity  Avoid making quick movements.  Rise slowly from chairs and steady yourself until you feel okay.  In the morning, first sit up on the side of the bed. When you feel okay, stand slowly while you hold onto something until you know that your balance is fine.  Move your legs often if you need to stand in one place for a long time. Tighten and relax your  muscles in your legs while you are standing.  Do not drive or operate heavy machinery if you feel dizzy.  Avoid bending down if you feel dizzy. Place items in your home so that they are easy for you to reach without leaning over. Lifestyle  Do not use any tobacco products, including cigarettes, chewing tobacco, or electronic cigarettes. If you need help quitting, ask your health care provider.  Try to reduce your stress level, such as with yoga or meditation. Talk with your health care provider if you need help. General Instructions  Watch your dizziness for any changes.  Take medicines only as directed by your health care provider. Talk with your health care provider if you think that your dizziness is caused by a medicine that you are taking.  Tell a friend or a family member that you are feeling dizzy. If he or she notices any changes in your behavior, have this person call your health care  provider.  Keep all follow-up visits as directed by your health care provider. This is important. SEEK MEDICAL CARE IF:  Your dizziness does not go away.  Your dizziness or light-headedness gets worse.  You feel nauseous.  You have reduced hearing.  You have new symptoms.  You are unsteady on your feet or you feel like the room is spinning. SEEK IMMEDIATE MEDICAL CARE IF:  You vomit or have diarrhea and are unable to eat or drink anything.  You have problems talking, walking, swallowing, or using your arms, hands, or legs.  You feel generally weak.  You are not thinking clearly or you have trouble forming sentences. It may take a friend or family member to notice this.  You have chest pain, abdominal pain, shortness of breath, or sweating.  Your vision changes.  You notice any bleeding.  You have a headache.  You have neck pain or a stiff neck.  You have a fever.   This information is not intended to replace advice given to you by your health care provider. Make sure you  discuss any questions you have with your health care provider.   Document Released: 02/27/2001 Document Revised: 01/18/2015 Document Reviewed: 08/30/2014 Elsevier Interactive Patient Education 2016 [employer] Inc[employer]  Hyperglycemia Hyperglycemia occurs when the glucose (sugar) in your blood is too high. Hyperglycemia can happen for many reasons, but it most often happens to people who do not know they have diabetes or are not managing their diabetes properly.  CAUSES  Whether you have diabetes or not, there are other causes of hyperglycemia. Hyperglycemia can occur when you have diabetes, but it can also occur in other situations that you might not be as aware of, such as: Diabetes  If you have diabetes and are having problems controlling your blood glucose, hyperglycemia could occur because of some of the following reasons:  Not following your meal plan.  Not taking your diabetes medications or not taking it properly.  Exercising less or doing less activity than you normally do.  Being sick. Pre-diabetes  This cannot be ignored. Before people develop Type 2 diabetes, they almost always have "pre-diabetes." This is when your blood glucose levels are higher than normal, but not yet high enough to be diagnosed as diabetes. Research has shown that some long-term damage to the body, especially the heart and circulatory system, may already be occurring during pre-diabetes. If you take action to manage your blood glucose when you have pre-diabetes, you may delay or prevent Type 2 diabetes from developing. Stress  If you have diabetes, you may be "diet" controlled or on oral medications or insulin to control your diabetes. However, you may find that your blood glucose is higher than usual in the hospital whether you have diabetes or not. This is often referred to as "stress hyperglycemia." Stress can elevate your blood glucose. This happens because of hormones put out by the body during times of  stress. If stress has been the cause of your high blood glucose, it can be followed regularly by your caregiver. That way he/she can make sure your hyperglycemia does not continue to get worse or progress to diabetes. Steroids  Steroids are medications that act on the infection fighting system (immune system) to block inflammation or infection. One side effect can be a rise in blood glucose. Most people can produce enough extra insulin to allow for this rise, but for those who cannot, steroids make blood glucose levels go even higher. It is not unusual for  steroid treatments to "uncover" diabetes that is developing. It is not always possible to determine if the hyperglycemia will go away after the steroids are stopped. A special blood test called an A1c is sometimes done to determine if your blood glucose was elevated before the steroids were started. SYMPTOMS  Thirsty.  Frequent urination.  Dry mouth.  Blurred vision.  Tired or fatigue.  Weakness.  Sleepy.  Tingling in feet or leg. DIAGNOSIS  Diagnosis is made by monitoring blood glucose in one or all of the following ways:  A1c test. This is a chemical found in your blood.  Fingerstick blood glucose monitoring.  Laboratory results. TREATMENT  First, knowing the cause of the hyperglycemia is important before the hyperglycemia can be treated. Treatment may include, but is not be limited to:  Education.  Change or adjustment in medications.  Change or adjustment in meal plan.  Treatment for an illness, infection, etc.  More frequent blood glucose monitoring.  Change in exercise plan.  Decreasing or stopping steroids.  Lifestyle changes. HOME CARE INSTRUCTIONS   Test your blood glucose as directed.  Exercise regularly. Your caregiver will give you instructions about exercise. Pre-diabetes or diabetes which comes on with stress is helped by exercising.  Eat wholesome, balanced meals. Eat often and at regular, fixed  times. Your caregiver or nutritionist will give you a meal plan to guide your sugar intake.  Being at an ideal weight is important. If needed, losing as little as 10 to 15 pounds may help improve blood glucose levels. SEEK MEDICAL CARE IF:   You have questions about medicine, activity, or diet.  You continue to have symptoms (problems such as increased thirst, urination, or weight gain). SEEK IMMEDIATE MEDICAL CARE IF:   You are vomiting or have diarrhea.  Your breath smells fruity.  You are breathing faster or slower.  You are very sleepy or incoherent.  You have numbness, tingling, or pain in your feet or hands.  You have chest pain.  Your symptoms get worse even though you have been following your caregiver's orders.  If you have any other questions or concerns.   This information is not intended to replace advice given to you by your health care provider. Make sure you discuss any questions you have with your health care provider.   Document Released: 02/27/2001 Document Revised: 11/26/2011 Document Reviewed: 05/10/2015 Elsevier Interactive Patient Education 2016 Elsevier Inc[employer] Shoulder Fracture (Proximal Humerus or Glenoid) A shoulder fracture is a broken upper arm bone or a broken socket bone. The humerus is the upper arm bone and the glenoid is the shoulder socket. Proximal means the humerus is broken near the shoulder. Most of the time the bones of a broken shoulder are in an acceptable position. Usually, the injury can be treated with a shoulder immobilizer or sling and swath bandage. These devices support the arm and prevent any shoulder movement. If the bones are not in a good position, then surgery is sometimes needed. Shoulder fractures usually initially cause swelling, pain, and discoloration around the upper arm. They heal in 8 to 12 weeks with proper treatment. SYMPTOMS  At the time of injury:  Pain.  Tenderness.  Regular body contours are not normal. Later  symptoms may include:  Swelling and bruising of the elbow and hand.  Swelling and bruising of the arm or chest. Other symptoms include:  Pain when lifting or turning the arm.  Paralysis below the fracture.  Numbness or coldness below the fracture. CAUSES  Indirect force from falling on an outstretched arm.  A blow to the shoulder. RISK INCREASES WITH:  Not being in shape.  Playing contact sports, such as football, soccer, hockey, or rugby.  Sports where falling on an outstretched arm occurs, such as basketball, skateboarding, or volleyball.  History of bone or joint disease.  History of shoulder injury. PREVENTION  Warm up before activity.  Stretch before activity.  Stay in shape with your:  Heart fitness.  Flexibility.  Shoulder Strength.  Falling with the proper technique. PROGNOSIS  In adults, healing time is about 7 weeks. For children, healing time is about 5 weeks. Surgery may be needed. RELATED COMPLICATIONS  The bones do not heal together (nonunion).  The bones do not align properly when they heal (malunion).  Long-term problems with pain, stiffness, swelling, or loss of motion.  The injured arm heals shorter than the other.  Nerves are injured in the arm.  Arthritis in the shoulder.  Normal bone growth is interrupted in children.  Blood supply to the shoulder joint is diminished. TREATMENT If the bones are aligned, then initial treatment will be with ice and medicine to help with pain. The shoulder will be held in place with a sling (immobilization). The shoulder will be allowed to heal for up to 6 weeks. Injuries that may need surgery include:  Severe fractures.  Fractures that are not in appropriate alignment (displaced).  Non-displaced fractures (not common). Surgery helps the bones align correctly. The bones may be held in place with:  Sutures.  Wires.  Rods.  Plates.  Screws.  Pins. If you have had surgery or not, you  will likely be assisted by a physical therapist or athletic trainer to get the best results with your injured shoulder. This will likely include exercises to strengthen and stretch the injured and surrounding areas. MEDICATION  If pain medicine is needed, nonsteroidal anti-inflammatory medicines (such as aspirin or ibuprofen) or other minor pain relievers (such as acetaminophen) are often advised.  Do not take pain medicine for 7 days before surgery.  Stronger pain relievers may be prescribed. Use only as directed and take only as much as you need. COLD THERAPY Cold treatment (icing) relieves pain and reduces inflammation. Cold treatment should be applied for 10 to 15 minutes every 2 to 3 hours, and immediately after activity that aggravates your symptoms. Use ice packs or an ice massage. SEEK IMMEDIATE MEDICAL CARE IF:  You have severe shoulder pain unrelieved by rest and taking pain medicine.  You have pain, numbness, tingling, or weakness in the hand or wrist.  You have shortness of breath, chest pain, severe weakness, or fainting.  You have severe pain with motion of the fingers or wrist.  Blue, gray, or dark color appears in the fingernails on injured extremity.   This information is not intended to replace advice given to you by your health care provider. Make sure you discuss any questions you have with your health care provider.   Document Released: 09/03/2005 Document Revised: 11/26/2011 Document Reviewed: 12/16/2008 Elsevier Interactive Patient Education [employer]

## 2015-11-04 NOTE — ED Notes (Signed)
Pt reports she became dizzy and fell this morning and hit her left shoulder and head. Pt denies LOC, N/V. Pt reports EMS took her CBG, and reported it to be over 300. Pt c/o 7 out of 10 pain in her left shoulder. Pt reports hx of diabetes.

## 2015-11-04 NOTE — ED Notes (Signed)
Pt states she was dark in her house and fell hitting her head on table.  No bruising or swelling noted to area.  Was here last week for a fall.

## 2015-11-04 NOTE — ED Provider Notes (Signed)
[facility] Emergency Department Provider Note     Time seen: ----------------------------------------- 7:15 AM on 11/04/2015 -----------------------------------------    I have reviewed the triage vital signs and the nursing notes.   HISTORY  Chief Complaint Fall    HPI [name] is a 50 y.o. female who presents to ER after having had a dizzy episode this morning where she fell and hit her left shoulder and her head. She denies loss of consciousness, reports her blood sugars are still elevated. Her main complaint right now is left shoulder pain, any movement of the shoulder causes severe pain. She denies fevers chills or other complaints. At her last visit she was referred to endocrinology and appointment was made. Patient states the appointment was canceled due to being overbooked.   Past Medical History  Diagnosis Date  . HTN (hypertension)   . Diabetes (HCC)   . Hyperlipidemia   . Depression   . Stroke (HCC)   . Epilepsy [facility])     Patient Active Problem List   Diagnosis Date Noted  . Epilepsy (HCC) 06/17/2015  . 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 Headache 05/19/2015  . Weakness of left side of body 05/19/2015  . H/O: CVA (cerebrovascular accident) 05/19/2015  . Headache(784.0) 03/19/2014  . Severe obesity (BMI >= 40) (HCC) 03/19/2014  . HTN (hypertension)   . Diabetes (HCC)   . Hyperlipidemia   . [name] (transient ischemic attack) 03/17/2014    Past Surgical History  Procedure Laterality Date  . Cesarean section      Allergies Review of patient's allergies indicates no known allergies.  Social History Social History  Substance Use Topics  . Smoking status: Never Smoker   . Smokeless tobacco: Not on file  . Alcohol Use: No    Review of Systems Constitutional: Negative for fever. Eyes: Negative for visual changes. ENT: Negative for sore throat. Cardiovascular: Negative for chest pain. Respiratory: Negative for shortness of  breath. Gastrointestinal: Negative for abdominal pain, vomiting and diarrhea. Genitourinary: Negative for dysuria. Musculoskeletal: Positive left shoulder pain Skin: Negative for rash. Neurological: Negative for headaches, focal weakness or numbness.  10-point ROS otherwise negative.  ____________________________________________   PHYSICAL EXAM:  VITAL SIGNS: ED Triage Vitals  Enc Vitals Group     BP 11/04/15 0424 143/78 mmHg     Pulse Rate 11/04/15 0424 96     Resp 11/04/15 0424 18     Temp 11/04/15 0424 98.1 F (36.7 C)     Temp Source 11/04/15 0424 Oral     SpO2 11/04/15 0424 97 %     Weight 11/04/15 0424 230 lb (104.327 kg)     Height 11/04/15 0424  (1.575 m)     Head Cir --      Peak Flow --      Pain Score 11/04/15 0642 7     Pain Loc --      Pain Edu? --      Excl. in GC? --     Constitutional: Alert and oriented. Well appearing and in no distress. Eyes: Conjunctivae are normal. Normal extraocular movements. ENT   Head: Normocephalic and atraumatic.   Nose: No congestion/rhinnorhea.   Mouth/Throat: Mucous membranes are moist.   Neck: No stridor. Cardiovascular: Normal rate, regular rhythm. Normal and symmetric distal pulses are present in all extremities. No murmurs, rubs, or gallops. Respiratory: Normal respiratory effort without tachypnea nor retractions. Breath sounds are clear and equal bilaterally. No wheezes/rales/rhonchi. Gastrointestinal: Soft and nontender. No distention. No abdominal  bruits.  Musculoskeletal: Left shoulder tenderness, pain with range of motion of left shoulder. Neurologic:  Normal speech and language. Mild left-sided weakness Skin:  Skin is warm, dry and intact. No rash noted. Psychiatric: Mood and affect are normal. Speech and behavior are normal. Patient exhibits appropriate insight and judgment. ____________________________________________  EKG: Interpreted by me. Normal sinus rhythm with rate 89 bpm, normal PR  interval, normal QRS, normal QT interval. T-wave inversions inferiorly and laterally  ____________________________________________  ED COURSE:  Pertinent labs & imaging results that were available during my care of the patient were reviewed by me and considered in my medical decision making (see chart for details). Patient is in no acute distress, we will obtain imaging and reevaluate. We will also check her blood sugar. ____________________________________________    LABS (pertinent positives/negatives)  Labs Reviewed  GLUCOSE, CAPILLARY - Abnormal; Notable for the following:    Glucose-Capillary 329 (*)    All other components within normal limits    RADIOLOGY Images were viewed by me  IMPRESSION: 1. Possible fracture involving the inferior glenoid. This may be artifactual and related to degenerative changes. 2. Large joint effusion.  IMPRESSION: Prior infarcts on the right, unchanged. 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 Underlying mild diffuse atrophy. No intracranial mass, hemorrhage, or acute appearing infarct. ____________________________________________  FINAL ASSESSMENT AND PLAN  Dizziness, fall, glenoid fracture, persistent hyperglycemia  Plan: Patient with labs and imaging as dictated above. Patient was placed in a shoulder immobilizer and will be referred to orthopedics for follow-up. I will increase her Lantus from 20 units to 25 units. She's been advised to follow-up with endocrinology without fail to improve her blood sugars. This is likely the reason for her dizziness. Currently she denies complaints other than left shoulder pain.   Emily Filbert, MD   Emily Filbert, MD 11/04/15 [phone]

## 2015-11-11 ENCOUNTER — Emergency Department: Payer: BLUE CROSS/BLUE SHIELD

## 2015-11-11 ENCOUNTER — Encounter: Payer: Self-pay | Admitting: Emergency Medicine

## 2015-11-11 ENCOUNTER — Emergency Department
Admission: EM | Admit: 2015-11-11 | Discharge: 2015-11-11 | Disposition: A | Discharge status: Home / Self Care | Payer: BLUE CROSS/BLUE SHIELD | Attending: Emergency Medicine | Admitting: Emergency Medicine

## 2015-11-11 DIAGNOSIS — M6289 Other specified disorders of muscle: Secondary | ICD-10-CM

## 2015-11-11 DIAGNOSIS — G43109 Migraine with aura, not intractable, without status migrainosus: Secondary | ICD-10-CM | POA: Diagnosis not present

## 2015-11-11 DIAGNOSIS — Z792 Long term (current) use of antibiotics: Secondary | ICD-10-CM | POA: Insufficient documentation

## 2015-11-11 DIAGNOSIS — G441 Vascular headache, not elsewhere classified: Secondary | ICD-10-CM | POA: Diagnosis not present

## 2015-11-11 DIAGNOSIS — Z7982 Long term (current) use of aspirin: Secondary | ICD-10-CM | POA: Diagnosis not present

## 2015-11-11 DIAGNOSIS — Z7984 Long term (current) use of oral hypoglycemic drugs: Secondary | ICD-10-CM | POA: Insufficient documentation

## 2015-11-11 DIAGNOSIS — I10 Essential (primary) hypertension: Secondary | ICD-10-CM | POA: Insufficient documentation

## 2015-11-11 DIAGNOSIS — E119 Type 2 diabetes mellitus without complications: Secondary | ICD-10-CM | POA: Insufficient documentation

## 2015-11-11 DIAGNOSIS — Z79899 Other long term (current) drug therapy: Secondary | ICD-10-CM | POA: Diagnosis not present

## 2015-11-11 DIAGNOSIS — Z794 Long term (current) use of insulin: Secondary | ICD-10-CM | POA: Insufficient documentation

## 2015-11-11 DIAGNOSIS — R51 Headache: Secondary | ICD-10-CM | POA: Diagnosis present

## 2015-11-11 DIAGNOSIS — R531 Weakness: Secondary | ICD-10-CM

## 2015-11-11 LAB — COMPREHENSIVE METABOLIC PANEL
ALBUMIN: 4.1 g/dL (ref 3.5–5.0)
ALK PHOS: 63 U/L (ref 38–126)
ALT: 15 U/L (ref 14–54)
AST: 19 U/L (ref 15–41)
Anion gap: 10 (ref 5–15)
BILIRUBIN TOTAL: 1 mg/dL (ref 0.3–1.2)
BUN: 9 mg/dL (ref 6–20)
CO2: 24 mmol/L (ref 22–32)
CREATININE: 0.65 mg/dL (ref 0.44–1.00)
Calcium: 9 mg/dL (ref 8.9–10.3)
Chloride: 100 mmol/L — ABNORMAL LOW (ref 101–111)
GFR calc Af Amer: >60 mL/min (ref >60)
GLUCOSE: 310 mg/dL — AB (ref 65–99)
POTASSIUM: 3.5 mmol/L (ref 3.5–5.1)
Sodium: 134 mmol/L — ABNORMAL LOW (ref 135–145)
TOTAL PROTEIN: 7.8 g/dL (ref 6.5–8.1)

## 2015-11-11 LAB — DIFFERENTIAL
Basophils Absolute: 0.1 10*3/uL (ref 0–0.1)
Eosinophils Absolute: 0.1 10*3/uL (ref 0–0.7)
Lymphs Abs: 1.6 10*3/uL (ref 1.0–3.6)
Monocytes Absolute: 0.7 10*3/uL (ref 0.2–0.9)
Monocytes Relative: 11 %
NEUTROS ABS: 3.8 10*3/uL (ref 1.4–6.5)

## 2015-11-11 LAB — TROPONIN I: Troponin I: 0.03 ng/mL (ref <0.031)

## 2015-11-11 LAB — CBC
HCT: 39.5 % (ref 35.0–47.0)
Hemoglobin: 12.2 g/dL (ref 12.0–16.0)
MCH: 19.3 pg — ABNORMAL LOW (ref 26.0–34.0)
MCHC: 30.8 g/dL — ABNORMAL LOW (ref 32.0–36.0)
MCV: 62.9 fL — ABNORMAL LOW (ref 80.0–100.0)
Platelets: 185 10*3/uL (ref 150–440)
RBC: 6.29 MIL/uL — AB (ref 3.80–5.20)
RDW: 20.2 % — ABNORMAL HIGH (ref 11.5–14.5)
WBC: 6.2 10*3/uL (ref 3.6–11.0)

## 2015-11-11 LAB — PROTIME-INR
INR: 1
PROTHROMBIN TIME: 13.4 s (ref 11.4–15.0)

## 2015-11-11 LAB — APTT: APTT: 29 s (ref 24–36)

## 2015-11-11 LAB — GLUCOSE, CAPILLARY: GLUCOSE-CAPILLARY: 275 mg/dL — AB (ref 65–99)

## 2015-11-11 LAB — ETHANOL: Alcohol, Ethyl (B): <5 mg/dL (ref <5)

## 2015-11-11 MED ORDER — HYDROMORPHONE HCL 1 MG/ML IJ SOLN
1.0000 mg | Freq: Once | INTRAMUSCULAR | Status: AC
  Administered 2015-11-11: 1 mg via INTRAVENOUS
  Filled 2015-11-11: qty 1

## 2015-11-11 MED ORDER — SODIUM CHLORIDE 0.9 % IV SOLN
Freq: Once | INTRAVENOUS | Status: AC
  Administered 2015-11-11: 12:00:00 via INTRAVENOUS

## 2015-11-11 MED ORDER — LORAZEPAM 2 MG/ML IJ SOLN
1.0000 mg | Freq: Once | INTRAMUSCULAR | Status: AC
  Administered 2015-11-11: 1 mg via INTRAVENOUS
  Filled 2015-11-11: qty 1

## 2015-11-11 MED ORDER — ONDANSETRON HCL 4 MG/2ML IJ SOLN
4.0000 mg | Freq: Once | INTRAMUSCULAR | Status: AC
  Administered 2015-11-11: 4 mg via INTRAVENOUS
  Filled 2015-11-11: qty 2

## 2015-11-11 NOTE — ED Provider Notes (Signed)
Time Seen: Approximately 11 AM  I have reviewed the triage notes  Chief Complaint: Code Stroke   History of Present Illness: [name] is a 50 y.o. female who presents with complaints of a severe headache and left-sided weakness. Patient was seen as a code stroke by the neurologist and recommendations were established. Patient otherwise does not have any new findings per CAT scan. He said denies any new focal weakness at this time. Discussion with the patient after neurology saw that the patient apparently she's had similar headaches before which are described as scar tissue from her previous strokes. Record shows that she has a history of complex migraines. The patient denies any current nausea, vomiting, neck pain or photophobia. She denies any new trouble with speech or swallowing. Patient states that her left-sided weakness started at 10 AM.   Past Medical History  Diagnosis Date  . HTN (hypertension)   . Diabetes (HCC)   . Hyperlipidemia   . Depression   . Stroke (HCC)   . Epilepsy [facility])     Patient Active Problem List   Diagnosis Date Noted  . Epilepsy (HCC) 06/17/2015  . 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 Headache 05/19/2015  . Weakness of left side of body 05/19/2015  . H/O: CVA (cerebrovascular accident) 05/19/2015  . Headache(784.0) 03/19/2014  . Severe obesity (BMI >= 40) (HCC) 03/19/2014  . HTN (hypertension)   . Diabetes (HCC)   . Hyperlipidemia   . [name] (transient ischemic attack) 03/17/2014    Past Surgical History  Procedure Laterality Date  . Cesarean section      Past Surgical History  Procedure Laterality Date  . Cesarean section      Current Outpatient Rx  Name  Route  Sig  Dispense  Refill  . albuterol (PROVENTIL HFA;VENTOLIN HFA) 108 (90 BASE) MCG/ACT inhaler   Inhalation   Inhale 2 puffs into the lungs every 6 (six) hours as needed for wheezing or shortness of breath.   1 Inhaler   2   . aspirin EC 325 MG EC tablet   Oral   Take 1  tablet (325 mg total) by mouth daily.   30 tablet   0   . butalbital-acetaminophen-caffeine (FIORICET) 50-325-40 MG tablet   Oral   Take 1-2 tablets by mouth every 6 (six) hours as needed for headache.   30 tablet   0   . insulin detemir (LEVEMIR) 100 UNIT/ML injection   Subcutaneous   Inject 0.25 mLs (25 Units total) into the skin at bedtime.   10 mL   11   . Iron TABS   Oral   Take 1 tablet by mouth daily. Strength unknown. otc.         . lisinopril-hydrochlorothiazide (PRINZIDE,ZESTORETIC) 20-12.5 MG per tablet   Oral   Take 1 tablet by mouth daily.   30 tablet   1   . metFORMIN (GLUCOPHAGE) 1000 MG tablet   Oral   Take 1,000 mg by mouth 2 (two) times daily with a meal.         . oxyCODONE-acetaminophen (PERCOCET) 5-325 MG tablet   Oral   Take 2 tablets by mouth every 6 (six) hours as needed for moderate pain or severe pain.   30 tablet   0   . rosuvastatin (CRESTOR) 10 MG tablet   Oral   Take 10 mg by mouth daily.         Marland Kitchen valproic acid (DEPAKENE) 250 MG capsule   Oral   Take  2 capsules (500 mg total) by mouth 2 (two) times daily.   120 capsule   0   . amoxicillin-clavulanate (AUGMENTIN) 875-125 MG per tablet   Oral   Take 1 tablet by mouth 2 (two) times daily.         . Liraglutide (VICTOZA) 18 MG/3ML SOPN   Subcutaneous   Inject 1 Dose into the skin daily.           Allergies:  Review of patient's allergies indicates no known allergies.  Family History: Family History  Problem Relation Age of Onset  . Hypertension Mother   . Hypertension Father   . Diabetes Mellitus II Mother     Social History: Social History  Substance Use Topics  . Smoking status: Never Smoker   . Smokeless tobacco: None  . Alcohol Use: No     Review of Systems:   10 point review of systems was performed and was otherwise negative:  Constitutional: No fever Eyes: No visual disturbances ENT: No sore throat, ear pain Cardiac: No chest  pain Respiratory: No shortness of breath, wheezing, or stridor Abdomen: No abdominal pain, no vomiting, No diarrhea Endocrine: No weight loss, No night sweats Extremities: No peripheral edema, cyanosis Skin: No rashes, easy bruising Neurologic: No focal weakness, trouble with speech or swollowing Urologic: No dysuria, Hematuria, or urinary frequency   Physical Exam:  ED Triage Vitals  Enc Vitals Group     BP 11/11/15 1054 136/106 mmHg     Pulse Rate 11/11/15 1054 94     Resp 11/11/15 1054 15     Temp 11/11/15 1054 98.2 F (36.8 C)     Temp Source 11/11/15 1054 Oral     SpO2 11/11/15 1054 96 %     Weight 11/11/15 1054 230 lb (104.327 kg)     Height 11/11/15 1054 5\' 2"  (1.575 m)     Head Cir --      Peak Flow --      Pain Score 11/11/15 1150 5     Pain Loc --      Pain Edu? --      Excl. in GC? --     General: Awake , Alert , and Oriented times 3; GCS 15 Head: Normal cephalic , atraumatic Eyes: Pupils equal , round, reactive to light Nose/Throat: No nasal drainage, patent upper airway without erythema or exudate.  Neck: Supple, Full range of motion, No anterior adenopathy or palpable thyroid masses Lungs: Clear to ascultation without wheezes , rhonchi, or rales Heart: Regular rate, regular rhythm without murmurs , gallops , or rubs Abdomen: Soft, non tender without rebound, guarding , or rigidity; bowel sounds positive and symmetric in all 4 quadrants. No organomegaly .        Extremities: 2 plus symmetric pulses. No edema, clubbing or cyanosis Neurologic: normal ambulation, Motor symmetric without deficits, sensory intact Skin: warm, dry, no rashes   Labs:   All laboratory work was reviewed including any pertinent negatives or positives listed below:  Labs Reviewed  CBC - Abnormal; Notable for the following:    RBC 6.29 (*)    MCV 62.9 (*)    MCH 19.3 (*)    MCHC 30.8 (*)    RDW 20.2 (*)    All other components within normal limits  COMPREHENSIVE METABOLIC PANEL  - Abnormal; Notable for the following:    Sodium 134 (*)    Chloride 100 (*)    Glucose, Bld 310 (*)    All other  components within normal limits  GLUCOSE, CAPILLARY - Abnormal; Notable for the following:    Glucose-Capillary 275 (*)    All other components within normal limits  ETHANOL  PROTIME-INR  APTT  DIFFERENTIAL  TROPONIN I  URINE RAPID DRUG SCREEN, HOSP PERFORMED    EKG: ED ECG REPORT I, Jennye Moccasin, the attending physician, personally viewed and interpreted this ECG.  Date: 11/11/2015 EKG Time: 1127 Rate: *88 Rhythm: normal sinus rhythm QRS Axis: normal Intervals: normal ST/T Wave abnormalities: Borderline T-wave abnormalities Conduction Disturbances: none Narrative Interpretation: unremarkable Old posterior infarct   Radiology:  CLINICAL DATA: 50 year old female code stroke patient with headache and increased left side weakness. Initial encounter. Previous right hemisphere infarct.  EXAM: MRI HEAD WITHOUT CONTRAST  TECHNIQUE: Multiplanar, multiecho pulse sequences of the brain and surrounding structures were obtained without intravenous contrast.  COMPARISON: CT head without contrast 1119 hours today and earlier.  FINDINGS: Major intracranial vascular flow voids are stable. No restricted diffusion or evidence of acute infarction.  Chronic encephalomalacia and gliosis in the right hemisphere from previous right MCA and PCA territory ischemia. Involvement of the dorsal left thalamus as before. Wallerian degeneration as before. No midline shift, mass effect, evidence of mass lesion, ventriculomegaly, extra-axial collection or acute intracranial hemorrhage. Cervicomedullary junction and pituitary are within normal limits. No new gray or white matter signal abnormality identified.  Stable visualized cervical spine. Stable visualized internal auditory structures, meet mastoid air cells, paranasal sinuses, bilateral orbits (chronic dysconjugate  gaze), and scalp soft tissues.  IMPRESSION: 1. No acute intracranial abnormality. 2. Stable noncontrast MRI appearance of the brain. 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   Electronically Signed By: Odessa Fleming M.D. On: 11/11/2015 13:24          CT Head Wo Contrast (Final result) Result time: 11/11/15 11:25:09   Final result by Rad Results In Interface (11/11/15 11:25:09)   Narrative:   CLINICAL DATA: Code stroke, Dr. Huel Cote @ [phone]. Headache that started within the past hour. Pt states hx of stroke. Pt with increased left sided weakness.  EXAM: CT HEAD WITHOUT CONTRAST  TECHNIQUE: Contiguous axial images were obtained from the base of the skull through the vertex without intravenous contrast.  COMPARISON: None.  FINDINGS: Brain: Stable right temporoparietal encephalomalacia. Stable mild ex vacuo dilatation of the adjacent right lateral ventricle. Mild diffuse parenchymal atrophy. No evidence of acute infarction, hemorrhage, extra-axial collection, or mass effect.  Vascular: No hyperdense vessel or unexpected calcification. Atherosclerotic and physiologic intracranial calcifications.  Skull: Negative for fracture or focal lesion.  Sinuses/Orbits: No acute findings.  Other: None.  IMPRESSION: 1. Negative for bleed or other acute intracranial process. 2. Stable right temporoparietal encephalomalacia.  Critical Value/emergent results were called by telephone at the time of interpretation on 11/11/2015 at 11:23 am to [facility], who verbally acknowledged these results.         I personally reviewed the radiologic studies     ED Course: Patient's had an extensive evaluation for her neurologic symptoms. She was seen by neurology and felt not to be an acute stroke and/or TPA candidate. Patient otherwise is back to her baseline and feels symptomatically improved with pain control for her headache. I felt this likely was an  exacerbation of her chronic cephalgia and is a complicated migraine. Patient's otherwise stable and will be discharged. She was advised continue with her pain medication at home but she does have in her possession and to follow up with her primary neurologist Dr.  Sherryll Burger.    Assessment: Complicated migraine  Final Clinical Impression: *  Final diagnoses:  Complicated migraine     Plan: * Outpatient management Patient was advised to return immediately if condition worsens. Patient was advised to follow up with their primary care physician or other specialized physicians involved in their outpatient care            Jennye Moccasin, MD 11/11/15 1600

## 2015-11-11 NOTE — ED Notes (Addendum)
Patient arrives to [facility] via POV with complaint of severe headache and increasing left side weakness starting at 1000am. Patient describes a sharp pain to the forehead and throbbing pain to the right side of her head. States this is the same pain that she had with her last stroke. Left sided weakness noted in triage. Charge RN Abigail Miyamoto Sec notified of Code stroke.

## 2015-11-11 NOTE — ED Notes (Signed)
Pt returned from MRI at this time, pt stable NAD noted at this time.

## 2015-11-11 NOTE — Discharge Instructions (Signed)
Migraine Headache °A migraine headache is an intense, throbbing pain on one or both sides of your head. A migraine can last for 30 minutes to several hours. °CAUSES  °The exact cause of a migraine headache is not always known. However, a migraine may be caused when nerves in the brain become irritated and release chemicals that cause inflammation. This causes pain. °Certain things may also trigger migraines, such as: °· Alcohol. °· Smoking. °· Stress. °· Menstruation. °· Aged cheeses. °· Foods or drinks that contain nitrates, glutamate, aspartame, or tyramine. °· Lack of sleep. °· Chocolate. °· Caffeine. °· Hunger. °· Physical exertion. °· Fatigue. °· Medicines used to treat chest pain (nitroglycerine), birth control pills, estrogen, and some blood pressure medicines. °SIGNS AND SYMPTOMS °· Pain on one or both sides of your head. °· Pulsating or throbbing pain. °· Severe pain that prevents daily activities. °· Pain that is aggravated by any physical activity. °· Nausea, vomiting, or both. °· Dizziness. °· Pain with exposure to bright lights, loud noises, or activity. °· General sensitivity to bright lights, loud noises, or smells. °Before you get a migraine, you may get warning signs that a migraine is coming (aura). An aura may include: °· Seeing flashing lights. °· Seeing bright spots, halos, or zigzag lines. °· Having tunnel vision or blurred vision. °· Having feelings of numbness or tingling. °· Having trouble talking. °· Having muscle weakness. °DIAGNOSIS  °A migraine headache is often diagnosed based on: °· Symptoms. °· Physical exam. °· A CT scan or MRI of your head. These imaging tests cannot diagnose migraines, but they can help rule out other causes of headaches. °TREATMENT °Medicines may be given for pain and nausea. Medicines can also be given to help prevent recurrent migraines.  °HOME CARE INSTRUCTIONS °· Only take over-the-counter or prescription medicines for pain or discomfort as directed by your  health care provider. The use of long-term narcotics is not recommended. °· Lie down in a dark, quiet room when you have a migraine. °· Keep a journal to find out what may trigger your migraine headaches. For example, write down: °¨ What you eat and drink. °¨ How much sleep you get. °¨ Any change to your diet or medicines. °· Limit alcohol consumption. °· Quit smoking if you smoke. °· Get 7-9 hours of sleep, or as recommended by your health care provider. °· Limit stress. °· Keep lights dim if bright lights bother you and make your migraines worse. °SEEK IMMEDIATE MEDICAL CARE IF:  °· Your migraine becomes severe. °· You have a fever. °· You have a stiff neck. °· You have vision loss. °· You have muscular weakness or loss of muscle control. °· You start losing your balance or have trouble walking. °· You feel faint or pass out. °· You have severe symptoms that are different from your first symptoms. °MAKE SURE YOU:  °· Understand these instructions. °· Will watch your condition. °· Will get help right away if you are not doing well or get worse. °  °This information is not intended to replace advice given to you by your health care provider. Make sure you discuss any questions you have with your health care provider. °  °Document Released: 09/03/2005 Document Revised: 09/24/2014 Document Reviewed: 05/11/2013 °[employer]vier Interactive Patient Education ©2016 [employer] ° °Please return immediately if condition worsens. Please contact her primary physician or the physician you were given for referral. If you have any specialist physicians involved in her treatment and plan please also contact   them. Thank you for using [facility]. ° °

## 2015-11-11 NOTE — ED Notes (Signed)
Patient transported to MRI at this time, pt stable, NAD noted at this time

## 2015-11-11 NOTE — Consult Note (Addendum)
Referring Physician: Huel Cote    Chief Complaint: Headache, left sided weakness  HPI: [name] is an 50 y.o. female with a history of stroke in the past with resultant headaches on Depakote who reports that today she awakened and kne wsomething was not right because she was unable to dress herself.  Later while riding in a car she noted the acute onset of a frontal headache that was severe (10/10) and a sharp throbbing sensdation.  She reports it is very much like the one she had when she had her stroke previously.  She was brought to the ED at that time.  NIHSS of 4. Patient with a left hemiparesis at baseline that she reports is worse since the onset of her headache.  She reports that she uses a walker at baseline for ambulation when outside of the home.  Date last known well: 11/10/2015  Time last known well: Unable to determine tPA Given: No: Outside time window  Past Medical History  Diagnosis Date  . HTN (hypertension)   . Diabetes (HCC)   . Hyperlipidemia   . Depression   . Stroke (HCC)   . Epilepsy [facility])     Past Surgical History  Procedure Laterality Date  . Cesarean section      Family History  Problem Relation Age of Onset  . Hypertension Mother   . Hypertension Father   . Diabetes Mellitus II Mother    Social History:  reports that she has never smoked. She does not have any smokeless tobacco history on file. She reports that she does not drink alcohol. Her drug history is not on file.  Allergies: No Known Allergies  Medications: I have reviewed the patient's current medications. Prior to Admission medications   Medication Sig Start Date End Date Taking? Authorizing Provider  albuterol (PROVENTIL HFA;VENTOLIN HFA) 108 (90 BASE) MCG/ACT inhaler Inhale 2 puffs into the lungs every 6 (six) hours as needed for wheezing or shortness of breath. 08/14/15   Jene Every, MD  amoxicillin-clavulanate (AUGMENTIN) 875-125 MG per tablet Take 1 tablet by mouth 2 (two) times  daily.    Historical Provider, MD  aspirin EC 325 MG EC tablet Take 1 tablet (325 mg total) by mouth daily. 03/19/14   Layne Benton, NP  butalbital-acetaminophen-caffeine (FIORICET) [phone] MG tablet Take 1-2 tablets by mouth every 6 (six) hours as needed for headache. 06/15/15 06/14/16  Milagros Loll, MD  insulin detemir (LEVEMIR) 100 UNIT/ML injection Inject 0.25 mLs (25 Units total) into the skin at bedtime. 11/04/15   Emily Filbert, MD  Iron TABS Take 1 tablet by mouth daily. Strength unknown. otc.    Historical Provider, MD  Liraglutide (VICTOZA) 18 MG/3ML SOPN Inject 1 Dose into the skin daily. 01/19/14   Historical Provider, MD  lisinopril-hydrochlorothiazide (PRINZIDE,ZESTORETIC) 20-12.5 MG per tablet Take 1 tablet by mouth daily. 04/19/15   Jene Every, MD  metFORMIN (GLUCOPHAGE) 1000 MG tablet Take 1,000 mg by mouth 2 (two) times daily with a meal.    Historical Provider, MD  oxyCODONE-acetaminophen (PERCOCET) 5-325 MG tablet Take 2 tablets by mouth every 6 (six) hours as needed for moderate pain or severe pain. 11/04/15   Emily Filbert, MD  oxyCODONE-acetaminophen (PERCOCET/ROXICET) 5-325 MG per tablet Take 1 tablet by mouth every 6 (six) hours as needed. 01/13/14   Historical Provider, MD  rosuvastatin (CRESTOR) 10 MG tablet Take 10 mg by mouth daily.    Historical Provider, MD  valproic acid (DEPAKENE) 250 MG capsule Take 2  capsules (500 mg total) by mouth 2 (two) times daily. 06/15/15   Srikar Sudini, MD    ROS: History obtained from the patient  General ROS: negative for - chills, fatigue, fever, night sweats, weight gain or weight loss Psychological ROS: negative for - behavioral disorder, hallucinations, memory difficulties, mood swings or suicidal ideation Ophthalmic ROS: negative for - blurry vision, double vision, eye pain or loss of vision ENT ROS: negative for - epistaxis, nasal discharge, oral lesions, sore throat, tinnitus or vertigo Allergy and Immunology ROS:  negative for - hives or itchy/watery eyes Hematological and Lymphatic ROS: negative for - bleeding problems, bruising or swollen lymph nodes Endocrine ROS: negative for - galactorrhea, hair pattern changes, polydipsia/polyuria or temperature intolerance Respiratory ROS: negative for - cough, hemoptysis, shortness of breath or wheezing Cardiovascular ROS: negative for - chest pain, dyspnea on exertion, edema or irregular heartbeat Gastrointestinal ROS: negative for - abdominal pain, diarrhea, hematemesis, nausea/vomiting or stool incontinence Genito-Urinary ROS: negative for - dysuria, hematuria, incontinence or urinary frequency/urgency Musculoskeletal ROS: negative for - joint swelling or muscular weakness Neurological ROS: as noted in HPI Dermatological ROS: negative for rash and skin lesion changes  Physical Examination: Blood pressure 136/106, pulse 94, temperature 98.2 F (36.8 C), temperature source Oral, resp. rate 15, height  (1.575 m), weight 104.327 kg (230 lb), last menstrual period 10/13/2015, SpO2 96 %.  HEENT-  Normocephalic, no lesions, without obvious abnormality.  Normal external eye and conjunctiva.  Normal TM's bilaterally.  Normal auditory canals and external ears. Normal external nose, mucus membranes and septum.  Normal pharynx. Cardiovascular- S1, S2 normal, pulses palpable throughout   Lungs- chest clear, no wheezing, rales, normal symmetric air entry Abdomen- soft, non-tender; bowel sounds normal; no masses,  no organomegaly Extremities- no edema Lymph-no adenopathy palpable Musculoskeletal-no joint tenderness, deformity or swelling Skin-warm and dry, no hyperpigmentation, vitiligo, or suspicious lesions  Neurological Examination Mental Status: Alert, oriented, thought content appropriate.  Speech fluent without evidence of aphasia.  Able to follow 3 step commands without difficulty. Cranial Nerves: II: Discs flat bilaterally; Visual fields grossly normal,  pupils equal, round, reactive to light and accommodation III,IV, VI: ptosis not present, extra-ocular motions intact bilaterally V,VII: decrease in left NLF, facial light touch sensation decreased on the left VIII: hearing normal bilaterally IX,X: gag reflex present XI: bilateral shoulder shrug XII: midline tongue extension Motor: Right : Upper extremity   5/5    Left:     Upper extremity   5-/5  Lower extremity   5/5     Lower extremity   4+/5 Tone and bulk:normal tone throughout; no atrophy noted Sensory: Pinprick and light touch decreased on the left Deep Tendon Reflexes: 2+ in the upper extremities, 1+ at the knees and absent at the ankles Plantars: Right: downgoing   Left: upgoing Cerebellar: Normal finger-to-nose and normal heel-to-shin testing on the right.  Dysmetric on the left particularly with upper extremity testing.   Gait: Able to stand and pivot transfer without assistance.     Laboratory Studies:  Basic Metabolic Panel: No results for input(s): NA, K, CL, CO2, GLUCOSE, BUN, CREATININE, CALCIUM, MG, PHOS in the last 168 hours.  Liver Function Tests: No results for input(s): AST, ALT, ALKPHOS, BILITOT, PROT, ALBUMIN in the last 168 hours. No results for input(s): LIPASE, AMYLASE in the last 168 hours. No results for input(s): AMMONIA in the last 168 hours.  CBC: No results for input(s): WBC, NEUTROABS, HGB, HCT, MCV, PLT in the last 168 hours.  Cardiac Enzymes: No results for input(s): CKTOTAL, CKMB, CKMBINDEX, TROPONINI in the last 168 hours.  BNP: Invalid input(s): POCBNP  CBG:  Recent Labs Lab 11/11/15 1121  GLUCAP 275*    Microbiology: Results for orders placed or performed during the hospital encounter of 03/17/14  MRSA PCR Screening     Status: None   Collection Time: 03/17/14  2:05 PM  Result Value Ref Range Status   MRSA by PCR NEGATIVE NEGATIVE Final    Comment:        The GeneXpert MRSA Assay (FDA approved for NASAL specimens only), is  one component of a comprehensive MRSA colonization surveillance program. It is not intended to diagnose MRSA infection nor to guide or monitor treatment for MRSA infections.    Coagulation Studies: No results for input(s): LABPROT, INR in the last 72 hours.  Urinalysis: No results for input(s): COLORURINE, LABSPEC, PHURINE, GLUCOSEU, HGBUR, BILIRUBINUR, KETONESUR, PROTEINUR, UROBILINOGEN, NITRITE, LEUKOCYTESUR in the last 168 hours.  Invalid input(s): APPERANCEUR  Lipid Panel:    Component Value Date/Time   CHOL 215* 12/27/2014 1033   CHOL 182 03/18/2014 0251   TRIG 103 12/27/2014 1033   TRIG 190* 03/18/2014 0251   HDL 46 12/27/2014 1033   HDL 55 03/18/2014 0251   CHOLHDL 3.3 03/18/2014 0251   VLDL 21 12/27/2014 1033   VLDL 38 03/18/2014 0251   LDLCALC 148* 12/27/2014 1033   LDLCALC 89 03/18/2014 0251    HgbA1C:  Lab Results  Component Value Date   HGBA1C 12.5* 06/14/2015    Urine Drug Screen:     Component Value Date/Time   LABOPIA POSITIVE* 05/18/2015 2040   LABBENZ NONE DETECTED 05/18/2015 2040   AMPHETMU NONE DETECTED 05/18/2015 2040   THCU NONE DETECTED 05/18/2015 2040   LABBARB NONE DETECTED 05/18/2015 2040    Alcohol Level: No results for input(s): ETH in the last 168 hours.  Other results: EKG: sinus rhythm at 88 bpm.  Imaging: Ct Head Wo Contrast  11/11/2015  CLINICAL DATA:  Code stroke, Dr. Huel Cote @ [phone]. Headache that started within the past hour. Pt states hx of stroke. Pt with increased left sided weakness. EXAM: CT HEAD WITHOUT CONTRAST TECHNIQUE: Contiguous axial images were obtained from the base of the skull through the vertex without intravenous contrast. COMPARISON:  None. FINDINGS: Brain: Stable right temporoparietal encephalomalacia. Stable mild ex vacuo dilatation of the adjacent right lateral ventricle. Mild diffuse parenchymal atrophy. No evidence of acute infarction, hemorrhage, extra-axial collection, or mass effect. Vascular: No  hyperdense vessel or unexpected calcification. Atherosclerotic and physiologic intracranial calcifications. Skull: Negative for fracture or focal lesion. Sinuses/Orbits: No acute findings. Other: None. IMPRESSION: 1. Negative for bleed or other acute intracranial process. 2. Stable right temporoparietal encephalomalacia. Critical Value/emergent results were called by telephone at the time of interpretation on 11/11/2015 at 11:23 am to [facility], who verbally acknowledged these results. Electronically Signed   By: Corlis Leak M.D.   On: 11/11/2015 11:25    Assessment: 50 y.o. female with a history of stroke in the past presenting with complaints of headache and worsening left sided weakness.  Patient outside window for tPA.  Concerned that symptoms may actually be related to headache and not to another acute ischemic event.  Further work up recommended.  Reports taking ASA daily at home  Stroke Risk Factors - diabetes mellitus, hyperlipidemia and hypertension  Plan: 1. Analgesia for headache 2. MRI of the brain without contrast.  If no acute infarct seen would not perform further neurological  work but may continue on ASA at home.   3. NPO until RN stroke swallow screen 4. Telemetry monitoring 5. Frequent neuro checks   Case discussed with Dr. Carolann Littler, MD Neurology [phone] 11/11/2015, 11:56 AM  Addendum: MRI of the brain personally reviewed and shows no acute changes.  No further neurologic intervention is recommended at this time.  If further questions arise, please call or page at that time.  Thank you for allowing neurology to participate in the care of this patient.  Thana Farr, MD Neurology [phone] 11/11/2015  1:50 PM

## 2015-11-11 NOTE — ED Notes (Signed)
Called code stroke to [phone] att 1107

## 2015-11-11 NOTE — ED Notes (Signed)
Dr Thad Ranger with patient in CT.

## 2015-11-27 ENCOUNTER — Encounter: Payer: Self-pay | Admitting: Emergency Medicine

## 2015-11-27 ENCOUNTER — Emergency Department: Payer: BLUE CROSS/BLUE SHIELD

## 2015-11-27 ENCOUNTER — Observation Stay
Admission: EM | Admit: 2015-11-27 | Discharge: 2015-11-28 | Disposition: A | Discharge status: Home / Self Care | Payer: BLUE CROSS/BLUE SHIELD | Attending: Internal Medicine | Admitting: Internal Medicine

## 2015-11-27 DIAGNOSIS — Z794 Long term (current) use of insulin: Secondary | ICD-10-CM | POA: Diagnosis not present

## 2015-11-27 DIAGNOSIS — Z833 Family history of diabetes mellitus: Secondary | ICD-10-CM | POA: Diagnosis not present

## 2015-11-27 DIAGNOSIS — M62838 Other muscle spasm: Secondary | ICD-10-CM | POA: Diagnosis not present

## 2015-11-27 DIAGNOSIS — E785 Hyperlipidemia, unspecified: Secondary | ICD-10-CM | POA: Insufficient documentation

## 2015-11-27 DIAGNOSIS — R202 Paresthesia of skin: Secondary | ICD-10-CM | POA: Diagnosis not present

## 2015-11-27 DIAGNOSIS — Z800 Family history of malignant neoplasm of digestive organs: Secondary | ICD-10-CM | POA: Diagnosis not present

## 2015-11-27 DIAGNOSIS — I10 Essential (primary) hypertension: Secondary | ICD-10-CM | POA: Diagnosis not present

## 2015-11-27 DIAGNOSIS — F329 Major depressive disorder, single episode, unspecified: Secondary | ICD-10-CM | POA: Insufficient documentation

## 2015-11-27 DIAGNOSIS — G40909 Epilepsy, unspecified, not intractable, without status epilepticus: Secondary | ICD-10-CM | POA: Insufficient documentation

## 2015-11-27 DIAGNOSIS — Z23 Encounter for immunization: Secondary | ICD-10-CM | POA: Diagnosis not present

## 2015-11-27 DIAGNOSIS — Z8673 Personal history of transient ischemic attack (TIA), and cerebral infarction without residual deficits: Secondary | ICD-10-CM | POA: Diagnosis not present

## 2015-11-27 DIAGNOSIS — I6782 Cerebral ischemia: Secondary | ICD-10-CM | POA: Diagnosis not present

## 2015-11-27 DIAGNOSIS — R200 Anesthesia of skin: Secondary | ICD-10-CM

## 2015-11-27 DIAGNOSIS — E1165 Type 2 diabetes mellitus with hyperglycemia: Secondary | ICD-10-CM | POA: Diagnosis not present

## 2015-11-27 DIAGNOSIS — Z7982 Long term (current) use of aspirin: Secondary | ICD-10-CM | POA: Insufficient documentation

## 2015-11-27 DIAGNOSIS — Z9114 Patient's other noncompliance with medication regimen: Secondary | ICD-10-CM | POA: Diagnosis not present

## 2015-11-27 DIAGNOSIS — I639 Cerebral infarction, unspecified: Secondary | ICD-10-CM | POA: Diagnosis present

## 2015-11-27 DIAGNOSIS — Z8249 Family history of ischemic heart disease and other diseases of the circulatory system: Secondary | ICD-10-CM | POA: Diagnosis not present

## 2015-11-27 DIAGNOSIS — I63411 Cerebral infarction due to embolism of right middle cerebral artery: Secondary | ICD-10-CM

## 2015-11-27 HISTORY — DX: Unspecified convulsions: R56.9

## 2015-11-27 LAB — URINALYSIS COMPLETE WITH MICROSCOPIC (ARMC ONLY)
BILIRUBIN URINE: NEGATIVE
Glucose, UA: >500 mg/dL — AB
Hgb urine dipstick: NEGATIVE
Leukocytes, UA: NEGATIVE
Nitrite: NEGATIVE
PH: 6 (ref 5.0–8.0)
PROTEIN: NEGATIVE mg/dL
SPECIFIC GRAVITY, URINE: 1.031 — AB (ref 1.005–1.030)

## 2015-11-27 LAB — URINE DRUG SCREEN, QUALITATIVE (ARMC ONLY)
AMPHETAMINES, UR SCREEN: NOT DETECTED
BENZODIAZEPINE, UR SCRN: NOT DETECTED
Barbiturates, Ur Screen: NOT DETECTED
CANNABINOID 50 NG, UR ~~LOC~~: NOT DETECTED
Cocaine Metabolite,Ur ~~LOC~~: NOT DETECTED
MDMA (Ecstasy)Ur Screen: NOT DETECTED
Methadone Scn, Ur: NOT DETECTED
OPIATE, UR SCREEN: NOT DETECTED
PHENCYCLIDINE (PCP) UR S: NOT DETECTED
Tricyclic, Ur Screen: NOT DETECTED

## 2015-11-27 LAB — CBC WITH DIFFERENTIAL/PLATELET
BASOS ABS: 0.1 10*3/uL (ref 0–0.1)
Basophils Relative: 2 %
Eosinophils Absolute: 0.1 10*3/uL (ref 0–0.7)
HCT: 36.4 % (ref 35.0–47.0)
Hemoglobin: 11.4 g/dL — ABNORMAL LOW (ref 12.0–16.0)
LYMPHS ABS: 1.6 10*3/uL (ref 1.0–3.6)
MCH: 19.3 pg — AB (ref 26.0–34.0)
MCHC: 31.4 g/dL — ABNORMAL LOW (ref 32.0–36.0)
MCV: 61.4 fL — AB (ref 80.0–100.0)
MONO ABS: 0.8 10*3/uL (ref 0.2–0.9)
Monocytes Relative: 13 %
Neutro Abs: 4 10*3/uL (ref 1.4–6.5)
Neutrophils Relative %: 59 %
PLATELETS: ADEQUATE 10*3/uL (ref 150–440)
RBC: 5.93 MIL/uL — ABNORMAL HIGH (ref 3.80–5.20)
RDW: 20.8 % — AB (ref 11.5–14.5)
WBC: 6.7 10*3/uL (ref 3.6–11.0)

## 2015-11-27 LAB — GLUCOSE, CAPILLARY
GLUCOSE-CAPILLARY: 355 mg/dL — AB (ref 65–99)
GLUCOSE-CAPILLARY: 377 mg/dL — AB (ref 65–99)

## 2015-11-27 LAB — COMPREHENSIVE METABOLIC PANEL
ALBUMIN: 3.8 g/dL (ref 3.5–5.0)
ALT: 14 U/L (ref 14–54)
AST: 15 U/L (ref 15–41)
Alkaline Phosphatase: 59 U/L (ref 38–126)
Anion gap: 10 (ref 5–15)
BILIRUBIN TOTAL: 0.7 mg/dL (ref 0.3–1.2)
BUN: 9 mg/dL (ref 6–20)
CO2: 22 mmol/L (ref 22–32)
Calcium: 8.6 mg/dL — ABNORMAL LOW (ref 8.9–10.3)
Chloride: 102 mmol/L (ref 101–111)
Creatinine, Ser: 0.69 mg/dL (ref 0.44–1.00)
GFR calc Af Amer: >60 mL/min (ref >60)
GFR calc non Af Amer: >60 mL/min (ref >60)
GLUCOSE: 307 mg/dL — AB (ref 65–99)
POTASSIUM: 3.8 mmol/L (ref 3.5–5.1)
Sodium: 134 mmol/L — ABNORMAL LOW (ref 135–145)
TOTAL PROTEIN: 7 g/dL (ref 6.5–8.1)

## 2015-11-27 LAB — PROTIME-INR
INR: 0.99
Prothrombin Time: 13.3 seconds (ref 11.4–15.0)

## 2015-11-27 LAB — LIPID PANEL
Cholesterol: 205 mg/dL — ABNORMAL HIGH (ref 0–200)
HDL: 55 mg/dL (ref >40)
LDL Cholesterol: 131 mg/dL — ABNORMAL HIGH (ref 0–99)
Total CHOL/HDL Ratio: 3.7 RATIO
Triglycerides: 94 mg/dL (ref <150)
VLDL: 19 mg/dL (ref 0–40)

## 2015-11-27 LAB — HEMOGLOBIN A1C: HEMOGLOBIN A1C: 13.5 % — AB (ref 4.0–6.0)

## 2015-11-27 LAB — APTT: aPTT: 28 seconds (ref 24–36)

## 2015-11-27 LAB — TROPONIN I: Troponin I: <0.03 ng/mL (ref <0.031)

## 2015-11-27 MED ORDER — INSULIN ASPART 100 UNIT/ML ~~LOC~~ SOLN
0.0000 [IU] | Freq: Every day | SUBCUTANEOUS | Status: DC
  Administered 2015-11-27: 5 [IU] via SUBCUTANEOUS
  Filled 2015-11-27: qty 5

## 2015-11-27 MED ORDER — ONDANSETRON HCL 4 MG PO TABS: 4.0000 mg | ORAL_TABLET | Freq: Four times a day (QID) | ORAL | Status: DC | PRN

## 2015-11-27 MED ORDER — ACETAMINOPHEN 325 MG PO TABS
650.0000 mg | ORAL_TABLET | Freq: Four times a day (QID) | ORAL | Status: DC | PRN
Start: 2015-11-27 — End: 2015-11-28

## 2015-11-27 MED ORDER — CYCLOBENZAPRINE HCL 10 MG PO TABS: 5.0000 mg | ORAL_TABLET | Freq: Three times a day (TID) | ORAL | Status: DC | PRN

## 2015-11-27 MED ORDER — LISINOPRIL 20 MG PO TABS
20.0000 mg | ORAL_TABLET | Freq: Every day | ORAL | Status: DC
  Administered 2015-11-27 – 2015-11-28 (×2): 20 mg via ORAL
  Filled 2015-11-27 (×2): qty 1

## 2015-11-27 MED ORDER — VALPROIC ACID 250 MG PO CAPS
500.0000 mg | ORAL_CAPSULE | Freq: Two times a day (BID) | ORAL | Status: DC
  Administered 2015-11-27 – 2015-11-28 (×3): 500 mg via ORAL
  Filled 2015-11-27 (×4): qty 2

## 2015-11-27 MED ORDER — ENOXAPARIN SODIUM 40 MG/0.4ML ~~LOC~~ SOLN
40.0000 mg | Freq: Two times a day (BID) | SUBCUTANEOUS | Status: DC
  Administered 2015-11-27 – 2015-11-28 (×2): 40 mg via SUBCUTANEOUS
  Filled 2015-11-27 (×2): qty 0.4

## 2015-11-27 MED ORDER — INSULIN ASPART 100 UNIT/ML ~~LOC~~ SOLN
0.0000 [IU] | Freq: Three times a day (TID) | SUBCUTANEOUS | Status: DC
  Administered 2015-11-27: 9 [IU] via SUBCUTANEOUS
  Administered 2015-11-28 (×2): 5 [IU] via SUBCUTANEOUS
  Filled 2015-11-27: qty 5
  Filled 2015-11-27: qty 9
  Filled 2015-11-27: qty 5

## 2015-11-27 MED ORDER — ASPIRIN EC 325 MG PO TBEC
325.0000 mg | DELAYED_RELEASE_TABLET | Freq: Every day | ORAL | Status: DC
  Administered 2015-11-28: 10:00:00 325 mg via ORAL
  Filled 2015-11-27: qty 1

## 2015-11-27 MED ORDER — ASPIRIN EC 325 MG PO TBEC: 325.0000 mg | DELAYED_RELEASE_TABLET | Freq: Every day | ORAL | Status: DC

## 2015-11-27 MED ORDER — LABETALOL HCL 5 MG/ML IV SOLN
20.0000 mg | Freq: Once | INTRAVENOUS | Status: AC
  Administered 2015-11-27: 20 mg via INTRAVENOUS

## 2015-11-27 MED ORDER — ALBUTEROL SULFATE HFA 108 (90 BASE) MCG/ACT IN AERS: 2.0000 | INHALATION_SPRAY | Freq: Four times a day (QID) | RESPIRATORY_TRACT | Status: DC | PRN

## 2015-11-27 MED ORDER — LABETALOL HCL 5 MG/ML IV SOLN
INTRAVENOUS | Status: AC
  Administered 2015-11-27: 20 mg via INTRAVENOUS
  Filled 2015-11-27: qty 4

## 2015-11-27 MED ORDER — ACETAMINOPHEN 650 MG RE SUPP: 650.0000 mg | Freq: Four times a day (QID) | RECTAL | Status: DC | PRN

## 2015-11-27 MED ORDER — ALBUTEROL SULFATE (2.5 MG/3ML) 0.083% IN NEBU: 2.5000 mg | INHALATION_SOLUTION | Freq: Four times a day (QID) | RESPIRATORY_TRACT | Status: DC | PRN

## 2015-11-27 MED ORDER — ONDANSETRON HCL 4 MG/2ML IJ SOLN: 4.0000 mg | Freq: Four times a day (QID) | INTRAMUSCULAR | Status: DC | PRN

## 2015-11-27 MED ORDER — ASPIRIN 81 MG PO CHEW
324.0000 mg | CHEWABLE_TABLET | Freq: Once | ORAL | Status: AC
  Administered 2015-11-27: 324 mg via ORAL

## 2015-11-27 MED ORDER — INFLUENZA VAC SPLIT QUAD 0.5 ML IM SUSY
0.5000 mL | PREFILLED_SYRINGE | INTRAMUSCULAR | Status: AC
  Administered 2015-11-28: 0.5 mL via INTRAMUSCULAR
  Filled 2015-11-27: qty 0.5

## 2015-11-27 MED ORDER — LISINOPRIL-HYDROCHLOROTHIAZIDE 20-12.5 MG PO TABS: 1.0000 | ORAL_TABLET | Freq: Every day | ORAL | Status: DC

## 2015-11-27 MED ORDER — INSULIN DETEMIR 100 UNIT/ML ~~LOC~~ SOLN
25.0000 [IU] | Freq: Every day | SUBCUTANEOUS | Status: DC
  Administered 2015-11-27: 25 [IU] via SUBCUTANEOUS
  Filled 2015-11-27 (×2): qty 0.25

## 2015-11-27 MED ORDER — ASPIRIN 81 MG PO CHEW
CHEWABLE_TABLET | ORAL | Status: AC
  Administered 2015-11-27: 324 mg via ORAL
  Filled 2015-11-27: qty 4

## 2015-11-27 MED ORDER — HYDROCHLOROTHIAZIDE 12.5 MG PO CAPS
12.5000 mg | ORAL_CAPSULE | Freq: Every day | ORAL | Status: DC
  Administered 2015-11-27 – 2015-11-28 (×2): 12.5 mg via ORAL
  Filled 2015-11-27 (×2): qty 1

## 2015-11-27 MED ORDER — HYDRALAZINE HCL 20 MG/ML IJ SOLN: 10.0000 mg | Freq: Four times a day (QID) | INTRAMUSCULAR | Status: DC | PRN

## 2015-11-27 NOTE — H&P (Signed)
[facility] [place] [facility]   PATIENT NAME: [name]    MR#:  [id number]  DATE OF BIRTH:  Jun 22, 1966  DATE OF ADMISSION:  11/27/2015  PRIMARY CARE PHYSICIAN: [name], MD   REQUESTING/REFERRING PHYSICIAN: Dr. Jene Every  CHIEF COMPLAINT:   Chief Complaint  Patient presents with  . Code Stroke    HISTORY OF PRESENT ILLNESS:  [name]  is a 50 y.o. female with a known history of previous CVA, history of seizures, diabetes, hypertension, hyperlipidemia, depression who presents to the hospital due to left sided numbness and muscle spasms. Patient says that she was in her usual state of health until this morning she started developing significant muscle spasms in her left lower extremity and then shortly after developed numbness in the left lower extremity and also in her fingers of her left upper extremity. She has a history of previous CVA and therefore was concerning came to the ER for further evaluation. In the emergency room patient was noted to be hypertensive with systolic blood pressures in the 190's. A neurologist on-call via telephone neurology was consulted in the ER and they recommended admission for further evaluation.  PAST MEDICAL HISTORY:   Past Medical History  Diagnosis Date  . HTN (hypertension)   . Diabetes (HCC)   . Hyperlipidemia   . Depression   . Stroke (HCC)   . Epilepsy (HCC)   . Seizures (HCC)     PAST SURGICAL HISTORY:   Past Surgical History  Procedure Laterality Date  . Cesarean section      SOCIAL HISTORY:   Social History  Substance Use Topics  . Smoking status: Never Smoker   . Smokeless tobacco: Not on file  . Alcohol Use: No    FAMILY HISTORY:   Family History  Problem Relation Age of Onset  . Hypertension Mother   . Hypertension Father   . Diabetes Mellitus II Mother   . Pancreatic cancer Father     DRUG ALLERGIES:  No Known Allergies  REVIEW OF SYSTEMS:   Review of Systems   Constitutional: Negative for fever and weight loss.  HENT: Negative for congestion, nosebleeds and tinnitus.   Eyes: Negative for blurred vision, double vision and redness.  Respiratory: Negative for cough, hemoptysis and shortness of breath.   Cardiovascular: Negative for chest pain, orthopnea, leg swelling and PND.  Gastrointestinal: Negative for nausea, vomiting, abdominal pain, diarrhea and melena.  Genitourinary: Negative for dysuria, urgency and hematuria.  Musculoskeletal: Negative for joint pain and falls.  Neurological: Positive for dizziness and sensory change. Negative for tingling, focal weakness, seizures, weakness and headaches.  Endo/Heme/Allergies: Negative for polydipsia. Does not bruise/bleed easily.  Psychiatric/Behavioral: Negative for depression and memory loss. The patient is not nervous/anxious.     MEDICATIONS AT HOME:   Prior to Admission medications   Medication Sig Start Date End Date Taking? Authorizing Provider  albuterol (PROVENTIL HFA;VENTOLIN HFA) 108 (90 BASE) MCG/ACT inhaler Inhale 2 puffs into the lungs every 6 (six) hours as needed for wheezing or shortness of breath. 08/14/15  Yes Jene Every, MD  aspirin EC 325 MG EC tablet Take 1 tablet (325 mg total) by mouth daily. 03/19/14  Yes Layne Benton, NP  insulin detemir (LEVEMIR) 100 UNIT/ML injection Inject 0.25 mLs (25 Units total) into the skin at bedtime. 11/04/15  Yes Emily Filbert, MD  lisinopril-hydrochlorothiazide (PRINZIDE,ZESTORETIC) 20-12.5 MG per tablet Take 1 tablet by mouth daily. 04/19/15  Yes Jene Every, MD  metFORMIN (GLUCOPHAGE) 1000 MG tablet Take 500 mg by mouth 2 (two) times daily with a meal.    Yes Historical Provider, MD  valproic acid (DEPAKENE) 250 MG capsule Take 2 capsules (500 mg total) by mouth 2 (two) times daily. Patient taking differently: Take 250 mg by mouth 2 (two) times daily.  06/15/15  Yes Srikar Sudini, MD      VITAL SIGNS:  Blood pressure 199/110, pulse 91,  temperature 98.5 F (36.9 C), temperature source Oral, resp. rate 18, height 5\' 2"  (1.575 m), weight 110.587 kg (243 lb 12.8 oz), last menstrual period 11/11/2015, SpO2 97 %.  PHYSICAL EXAMINATION:  Physical Exam  GENERAL:  50 y.o.-year-old patient lying in the bed with no acute distress.  EYES: Pupils equal, round, reactive to light and accommodation. No scleral icterus. Extraocular muscles intact.  HEENT: Head atraumatic, normocephalic. Oropharynx and nasopharynx clear. No oropharyngeal erythema, moist oral mucosa  NECK:  Supple, no jugular venous distention. No thyroid enlargement, no tenderness.  LUNGS: Normal breath sounds bilaterally, no wheezing, rales, rhonchi. No use of accessory muscles of respiration.  CARDIOVASCULAR: S1, S2 RRR. No murmurs, rubs, gallops, clicks.  ABDOMEN: Soft, nontender, nondistended. Bowel sounds present. No organomegaly or mass.  EXTREMITIES: No pedal edema, cyanosis, or clubbing. + 2 pedal & radial pulses b/l.   NEUROLOGIC: Cranial nerves II through XII are intact. 2-3 out of 5 strength in the left lower and upper extremities compared to the right. No sensory deficits appreciated. PSYCHIATRIC: The patient is alert and oriented x 3. Good affect.  SKIN: No obvious rash, lesion, or ulcer.   LABORATORY PANEL:   CBC No results for input(s): WBC, HGB, HCT, PLT in the last 168 hours. ------------------------------------------------------------------------------------------------------------------  Chemistries   Recent Labs Lab 11/27/15 0916  NA 134*  K 3.8  CL 102  CO2 22  GLUCOSE 307*  BUN 9  CREATININE 0.69  CALCIUM 8.6*  AST 15  ALT 14  ALKPHOS 59  BILITOT 0.7   ------------------------------------------------------------------------------------------------------------------  Cardiac Enzymes  Recent Labs Lab 11/27/15 0916  TROPONINI <0.03    ------------------------------------------------------------------------------------------------------------------  RADIOLOGY:     IMPRESSION AND PLAN:   50 year old female with past medical history of previous CVA, seizures, hypertension, diabetes, hyperlipidemia who presented to the hospital due to left-sided numbness and tingling.  #1 CVA/[name]-this is the working diagnosis given patient's acute neurologic symptoms which have not improved. -Patient went to CT of the head which showed no acute abnormality. I will admit her to observation and get MRI of the brain, neurology consult. -Continue aspirin, will check a lipid profile and if needed start on high dose intensity statin.  #2 essential hypertension-patient's blood pressures are somewhat uncontrolled. Continue lisinopril/HCTZ. -I will add some as needed hydralazine.  #3 history of seizures-continue Depakote.  #4 diabetes type 2 without complication-continue Levemir, sliding scale insulin. Follow blood sugars.    All the records are reviewed and case discussed with ED provider. Management plans discussed with the patient, family and they are in agreement.  CODE STATUS: Full  TOTAL TIME TAKING CARE OF THIS PATIENT: 45 minutes.    Houston SirenSAINANI,VIVEK J M.D on 11/27/2015 at 11:31 AM  Between 7am to 6pm - Pager - [phone]  After 6pm go to [email or web address] - password [email or web address] [facility]  [facility]  Office  [phone]  CC: Primary care physician; Marikay AlarEric Sonnenberg, MD

## 2015-11-27 NOTE — ED Notes (Signed)
Patient presents to the ED with left leg weakness/heaviness after waking up this am about 6:30am with muscle spasms in both legs.  Patient reports post muscle spasms, she noted increased weakness and heaviness in her left leg.  Patient reports tingling in her fingers in her left hand as well.  Patient reports history of 3 strokes in the past.

## 2015-11-27 NOTE — ED Notes (Signed)
Pt transported to and from CT on monitor with RN.

## 2015-11-27 NOTE — ED Notes (Signed)
Called code stroke to 3333  3/12 at 0918 per first nurse

## 2015-11-27 NOTE — Progress Notes (Signed)
Order for enoxaparin 40 mg subcutaneously once daily has been changed to BID for DVT prophylaxis per anticoagulation protocol for CrCl > 30 mL/min and BMI > 40.   Cindi CarbonMary M Faythe Heitzenrater, PharmD Clinical Pharmacist 11/27/15 1:00 PM

## 2015-11-27 NOTE — ED Notes (Signed)
Pt ambulated to the restroom.

## 2015-11-27 NOTE — Consult Note (Signed)
Referring Physician: Cyril LoosenKinner    Chief Complaint: Muscle spasms, left sided tingling  HPI: [name] is an 50 y.o. 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  Patient reports that she has some left sided weakness as a residual from her previous infarct but does not have any sensory disturbances.  On review of her previous encounters though this does not appear to be the case.  In those encounters she reported a baseline sensory disturbance on the left.    Date last known well: 11/26/2015 Time last known well: Time: 22:00 tPA Given: No: Outside time window  Past Medical History  Diagnosis Date  . HTN (hypertension)   . Diabetes (HCC)   . Hyperlipidemia   . Depression   . Stroke (HCC)   . Epilepsy (HCC)   . Seizures [facility](HCC)     Past Surgical History  Procedure Laterality Date  . Cesarean section      Family History  Problem Relation Age of Onset  . Hypertension Mother   . Hypertension Father   . Diabetes Mellitus II Mother   . Pancreatic cancer Father    Social History:  reports that she has never smoked. She does not have any smokeless tobacco history on file. She reports that she does not drink alcohol or use illicit drugs.  Allergies: No Known Allergies  Medications:  I have reviewed the patient's current medications. Prior to Admission:  Prescriptions prior to admission  Medication Sig Dispense Refill Last Dose  . albuterol (PROVENTIL HFA;VENTOLIN HFA) 108 (90 BASE) MCG/ACT inhaler Inhale 2 puffs into the lungs every 6 (six) hours as needed for wheezing or shortness of breath. 1 Inhaler 2 prn at prn  . aspirin EC 325 MG EC tablet Take 1 tablet (325 mg total) by mouth daily. 30 tablet 0 11/18/2015 at unknown  . insulin detemir (LEVEMIR) 100 UNIT/ML injection Inject 0.25 mLs (25 Units total) into the skin at bedtime. 10 mL 11 11/26/2015 at  Unknown time  . lisinopril-hydrochlorothiazide (PRINZIDE,ZESTORETIC) 20-12.5 MG per tablet Take 1 tablet by mouth daily. 30 tablet 1 11/26/2015 at Unknown time  . metFORMIN (GLUCOPHAGE) 1000 MG tablet Take 500 mg by mouth 2 (two) times daily with a meal.    11/26/2015 at Unknown time  . valproic acid (DEPAKENE) 250 MG capsule Take 2 capsules (500 mg total) by mouth 2 (two) times daily. (Patient taking differently: Take 250 mg by mouth 2 (two) times daily. ) 120 capsule 0 11/26/2015 at Unknown time   Scheduled: . aspirin EC  325 mg Oral Daily  . enoxaparin (LOVENOX) injection  40 mg Subcutaneous Q12H  . insulin aspart  0-5 Units Subcutaneous QHS  . insulin aspart  0-9 Units Subcutaneous TID WC  . insulin detemir  25 Units Subcutaneous QHS  . lisinopril-hydrochlorothiazide  1 tablet Oral Daily  . valproic acid  500 mg Oral BID    ROS: History obtained from the patient  General ROS: negative for - chills, fatigue, fever, night sweats, weight gain or weight loss Psychological ROS: negative for - behavioral disorder, hallucinations, memory difficulties, mood swings or suicidal ideation Ophthalmic ROS: negative for - blurry vision, double vision, eye pain or loss of vision ENT ROS: negative for - epistaxis, nasal discharge, oral lesions, sore throat, tinnitus or vertigo Allergy and Immunology ROS: negative for - hives or itchy/watery eyes Hematological and Lymphatic ROS:  negative for - bleeding problems, bruising or swollen lymph nodes Endocrine ROS: negative for - galactorrhea, hair pattern changes, polydipsia/polyuria or temperature intolerance Respiratory ROS: negative for - cough, hemoptysis, shortness of breath or wheezing Cardiovascular ROS: negative for - chest pain, dyspnea on exertion, edema or irregular heartbeat Gastrointestinal ROS: negative for - abdominal pain, diarrhea, hematemesis, nausea/vomiting or stool incontinence Genito-Urinary ROS: negative for - dysuria, hematuria,  incontinence or urinary frequency/urgency Musculoskeletal ROS: as noted in HPI Neurological ROS: as noted in HPI Dermatological ROS: negative for rash and skin lesion changes  Physical Examination: Blood pressure 164/90, pulse 85, temperature 98.5 F (36.9 C), temperature source Oral, resp. rate 18, height 5\' 2"  (1.575 m), weight 110.587 kg (243 lb 12.8 oz), last menstrual period 11/11/2015, SpO2 100 %.  HEENT-  Normocephalic, no lesions, without obvious abnormality.  Normal external eye and conjunctiva.  Normal TM's bilaterally.  Normal auditory canals and external ears. Normal external nose, mucus membranes and septum.  Normal pharynx. Cardiovascular- S1, S2 normal, pulses palpable throughout   Lungs- chest clear, no wheezing, rales, normal symmetric air entry Abdomen- soft, non-tender; bowel sounds normal; no masses,  no organomegaly Extremities- no edema Lymph-no adenopathy palpable Musculoskeletal-pain on palpation of any musculature in either of the lower extremities Skin-warm and dry, no hyperpigmentation, vitiligo, or suspicious lesions  Neurological Examination Mental Status: Alert, oriented, thought content appropriate.  Speech fluent without evidence of aphasia.  Able to follow 3 step commands without difficulty. Cranial Nerves: II: Discs flat bilaterally; Visual fields grossly normal, pupils equal, round, reactive to light and accommodation III,IV, VI: ptosis not present, extra-ocular motions intact bilaterally V,VII: smile symmetric, facial light touch sensation normal bilaterally VIII: hearing normal bilaterally IX,X: gag reflex present XI: bilateral shoulder shrug XII: midline tongue extension Motor: Patient able to lift all extremities against gravity but gives no resistance to any external pressure in any extremity.   Sensory: Pinprick and light touch decreased in the left lower extremity and the left hand.   Deep Tendon Reflexes: 2+ in the upper extremities, 1+ at  the knees and absent at the ankles.   Plantars: Right: downgoing   Left: downgoing Cerebellar: Normal finger-to-nose and normal heel-to-shin testing bilaterally Gait: not tested due to safety concerns   Laboratory Studies:  Basic Metabolic Panel:  Recent Labs Lab 11/27/15 0916  NA 134*  K 3.8  CL 102  CO2 22  GLUCOSE 307*  BUN 9  CREATININE 0.69  CALCIUM 8.6*    Liver Function Tests:  Recent Labs Lab 11/27/15 0916  AST 15  ALT 14  ALKPHOS 59  BILITOT 0.7  PROT 7.0  ALBUMIN 3.8   No results for input(s): LIPASE, AMYLASE in the last 168 hours. No results for input(s): AMMONIA in the last 168 hours.  CBC:  Recent Labs Lab 11/27/15 0916  WBC 6.7  NEUTROABS 4.0  HGB 11.4*  HCT 36.4  MCV 61.4*  PLT PLATELETS APPEAR ADEQUATE    Cardiac Enzymes:  Recent Labs Lab 11/27/15 0916  TROPONINI <0.03    BNP: Invalid input(s): POCBNP  CBG: No results for input(s): GLUCAP in the last 168 hours.  Microbiology: Results for orders placed or performed during the hospital encounter of 03/17/14  MRSA PCR Screening     Status: None   Collection Time: 03/17/14  2:05 PM  Result Value Ref Range Status   MRSA by PCR NEGATIVE NEGATIVE Final    Comment:        The GeneXpert MRSA Assay (FDA approved  for NASAL specimens only), is one component of a comprehensive MRSA colonization surveillance program. It is not intended to diagnose MRSA infection nor to guide or monitor treatment for MRSA infections.    Coagulation Studies:  Recent Labs  11/27/15 0916  LABPROT 13.3  INR 0.99    Urinalysis: No results for input(s): COLORURINE, LABSPEC, PHURINE, GLUCOSEU, HGBUR, BILIRUBINUR, KETONESUR, PROTEINUR, UROBILINOGEN, NITRITE, LEUKOCYTESUR in the last 168 hours.  Invalid input(s): APPERANCEUR  Lipid Panel:    Component Value Date/Time   CHOL 205* 11/27/2015 0916   CHOL 215* 12/27/2014 1033   TRIG 94 11/27/2015 0916   TRIG 103 12/27/2014 1033   HDL 55  11/27/2015 0916   HDL 46 12/27/2014 1033   CHOLHDL 3.7 11/27/2015 0916   VLDL 19 11/27/2015 0916   VLDL 21 12/27/2014 1033   LDLCALC 131* 11/27/2015 0916   LDLCALC 148* 12/27/2014 1033    HgbA1C:  Lab Results  Component Value Date   HGBA1C 12.5* 06/14/2015    Urine Drug Screen:     Component Value Date/Time   LABOPIA POSITIVE* 05/18/2015 2040   LABBENZ NONE DETECTED 05/18/2015 2040   AMPHETMU NONE DETECTED 05/18/2015 2040   THCU NONE DETECTED 05/18/2015 2040   LABBARB NONE DETECTED 05/18/2015 2040    Alcohol Level: No results for input(s): ETH in the last 168 hours.    Imaging: Ct Head Wo Contrast  11/27/2015  CLINICAL DATA:  Code stroke. Leg tremors beginning at 6:30 a.m. today. Left leg heaviness. EXAM: CT HEAD WITHOUT CONTRAST TECHNIQUE: Contiguous axial images were obtained from the base of the skull through the vertex without intravenous contrast. COMPARISON:  MRI brain 11/11/2015. CT head without contrast 11/11/2015. FINDINGS: The remote posterior right MCA territory infarct encephalomalacia is again noted. This extends from the right temporal lobe into the posterior right frontal and parietal lobes. There is ex vacuo dilation of the right lateral ventricle. There is some involvement of the basal ganglia as well. No acute cortical infarct or hemorrhage is present. No significant white matter disease is present on the left. No significant extra-axial fluid collection is present. The paranasal sinuses and mastoid air cells are clear. Atherosclerotic calcifications are present within the cavernous internal carotid arteries bilaterally, right greater than left. Dural calcifications within the middle cranial fossa bilaterally are stable. The calvarium is intact. No significant extra-axial, the no significant extracranial soft tissue lesions are present. IMPRESSION: 1. No acute intracranial abnormality or significant interval change. 2. Stable posterior right MCA territory infarct  without definite extension. 3. Atherosclerosis. These results were called by telephone at the time of interpretation on 11/27/2015 at 9:33 am to Dr. Jene Every , who verbally acknowledged these results. Electronically Signed   By: Marin Roberts M.D.   On: 11/27/2015 09:35    Assessment: 50 y.o. female presenting with tingling in the left upper and lower extremity and pain in the lower extremities.  Patient with a history of right MCA infarct.  On ASA at home and reports compliance.  Head CT personally reviewed and shows no acute changes.  Do not suspect acute infarct but patient with risk factors.  Outside time window for tPA.  Unclear etiology of pain.   LDL 131  Stroke Risk Factors - diabetes mellitus, hyperlipidemia and hypertension  Plan: 1. HgbA1c 2. MRI of the brain without contrast.  If no evidence of acute infarct would not proceed with stroke work up.   3. Prophylactic therapy-Continue ASA  4. NPO until RN stroke swallow screen 5.  Telemetry monitoring 6. Frequent neuro checks 7. Flexeril for lower extremity pain.     Thana Farr, MD Neurology [phone] 11/27/2015, 12:54 PM

## 2015-11-27 NOTE — Progress Notes (Signed)
Pt is alert and oriented x 4, ambulatory but with bilateral leg weakness, residual weakness from past stroke, CT performed in ED, vital signs stable, good appetite, family at bedside, uneventful shift.

## 2015-11-27 NOTE — ED Provider Notes (Signed)
[facility] Emergency Department Provider Note  ____________________________________________    I have reviewed the triage vital signs and the nursing notes.   HISTORY  Chief Complaint Code Stroke    HPI [name] is a 50 y.o. female who presents with complaints of heaviness in her left leg. Patient does have a significant history of a prior CVA but probably also has complex migraines. Patient notes at 6:30 AM she woke up because she was having spasms in both of her legs. She then reports that she developed heaviness in her left leg and tingling in her left hand. She does have residual weakness on her left side from a prior CVA. She denies headache. No neck pain.     Past Medical History  Diagnosis Date  . HTN (hypertension)   . Diabetes (HCC)   . Hyperlipidemia   . Depression   . Stroke (HCC)   . Epilepsy (HCC)   . Seizures [facility])     Patient Active Problem List   Diagnosis Date Noted  . Epilepsy (HCC) 06/17/2015  . 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 Headache 05/19/2015  . Weakness of left side of body 05/19/2015  . H/O: CVA (cerebrovascular accident) 05/19/2015  . Headache(784.0) 03/19/2014  . Severe obesity (BMI >= 40) (HCC) 03/19/2014  . HTN (hypertension)   . Diabetes (HCC)   . Hyperlipidemia   . [name] (transient ischemic attack) 03/17/2014    Past Surgical History  Procedure Laterality Date  . Cesarean section      Current Outpatient Rx  Name  Route  Sig  Dispense  Refill  . albuterol (PROVENTIL HFA;VENTOLIN HFA) 108 (90 BASE) MCG/ACT inhaler   Inhalation   Inhale 2 puffs into the lungs every 6 (six) hours as needed for wheezing or shortness of breath.   1 Inhaler   2   . aspirin EC 325 MG EC tablet   Oral   Take 1 tablet (325 mg total) by mouth daily.   30 tablet   0   . insulin detemir (LEVEMIR) 100 UNIT/ML injection   Subcutaneous   Inject 0.25 mLs (25 Units total) into the skin at bedtime.   10 mL   11   .  lisinopril-hydrochlorothiazide (PRINZIDE,ZESTORETIC) 20-12.5 MG per tablet   Oral   Take 1 tablet by mouth daily.   30 tablet   1   . metFORMIN (GLUCOPHAGE) 1000 MG tablet   Oral   Take 500 mg by mouth 2 (two) times daily with a meal.          . valproic acid (DEPAKENE) 250 MG capsule   Oral   Take 2 capsules (500 mg total) by mouth 2 (two) times daily. Patient taking differently: Take 250 mg by mouth 2 (two) times daily.    120 capsule   0     Allergies Review of patient's allergies indicates no known allergies.  Family History  Problem Relation Age of Onset  . Hypertension Mother   . Hypertension Father   . Diabetes Mellitus II Mother     Social History Social History  Substance Use Topics  . Smoking status: Never Smoker   . Smokeless tobacco: None  . Alcohol Use: No    Review of Systems  Constitutional: Negative for fever. Eyes: Negative for visual changes. ENT: Negative for sore throat Cardiovascular: Negative for chest pain. Respiratory: Negative for shortness of breath. Gastrointestinal: Negative for abdominal pain Genitourinary: Negative for dysuria. Musculoskeletal: Negative for Neck pain Skin: Negative forInjury Neurological:  No headache, as above Psychiatric:Anxious    ____________________________________________   PHYSICAL EXAM:  VITAL SIGNS: ED Triage Vitals  Enc Vitals Group     BP 11/27/15 0912 185/159 mmHg     Pulse Rate 11/27/15 0912 92     Resp 11/27/15 0912 20     Temp 11/27/15 0912 98.5 F (36.9 C)     Temp Source 11/27/15 0912 Oral     SpO2 11/27/15 0912 99 %     Weight 11/27/15 0912 243 lb 12.8 oz (110.587 kg)     Height 11/27/15 0912 5\' 2"  (1.575 m)     Head Cir --      Peak Flow --      Pain Score 11/27/15 0916 0     Pain Loc --      Pain Edu? --      Excl. in GC? --      Constitutional: Alert and oriented. No acute distress Eyes: Conjunctivae are normal.  ENT   Head: Normocephalic and atraumatic.    Mouth/Throat: Mucous membranes are moist. Cardiovascular: Normal rate, regular rhythm. Normal and symmetric distal pulses are present in all extremities.  Respiratory: Normal respiratory effort without tachypnea nor retractions. Breath sounds are clear and equal bilaterally.  Gastrointestinal: Soft and non-tender in all quadrants. No distention. There is no CVA tenderness. Genitourinary: deferred Musculoskeletal: Nontender with normal range of motion in all extremities. No lower extremity tenderness nor edema. Neurologic:  Normal speech and language. Patient appears to have decreased strength in her left lower extremity, it is not clear whether this is residual or new. Strength in her upper extremities appears normal Skin:  Skin is warm, dry and intact. No rash noted. Psychiatric: Mood and affect are normal. Patient exhibits appropriate insight and judgment.  ____________________________________________    LABS (pertinent positives/negatives)  Labs Reviewed  COMPREHENSIVE METABOLIC PANEL - Abnormal; Notable for the following:    Sodium 134 (*)    Glucose, Bld 307 (*)    Calcium 8.6 (*)    All other components within normal limits  APTT  TROPONIN I  PROTIME-INR  CBC WITH DIFFERENTIAL/PLATELET  URINALYSIS COMPLETEWITH MICROSCOPIC ([facility] ONLY)  URINE DRUG SCREEN, QUALITATIVE ([facility] ONLY)    ____________________________________________   EKG  ED ECG REPORT I, Jene EveryKINNER, [place] Berkley, the attending physician, personally viewed and interpreted this ECG.  Date: 11/27/2015 EKG Time: 9:14 AM Rate: 90 Rhythm: normal sinus rhythm QRS Axis: normal Intervals: normal ST/T Wave abnormalities: normal Conduction Disturbances: none Narrative Interpretation: Nonspecific changes   ____________________________________________    RADIOLOGY I have personally reviewed any xrays that were ordered on this patient: CT head shows no acute  changes  ____________________________________________   PROCEDURES  Procedure(s) performed: none  Critical Care performed: none  ____________________________________________   INITIAL IMPRESSION / ASSESSMENT AND PLAN / ED COURSE  Pertinent labs & imaging results that were available during my care of the patient were reviewed by me and considered in my medical decision making (see chart for details).  Patient reports new heaviness in her left lower extremity which apparently started at 6:30 AM. Code stroke called. CT is reported as no new changes.  Slight delay in Neurologist evaluation 2/2 confusion re: call schedule  ----------------------------------------- 10:53 AM on 11/27/2015 -----------------------------------------  Neurologist feels no TPA is indicated but does recommend admission for further evaluation and treatment of hypertension hyperglycemia. Recommends 324 mg of aspirin  ____________________________________________   FINAL CLINICAL IMPRESSION(S) / ED DIAGNOSES  Final diagnoses:  Cerebral infarction due to unspecified mechanism  Jene Every, MD 11/27/15 1054

## 2015-11-27 NOTE — ED Notes (Signed)
SOC in room. Neurologist assessing pt.

## 2015-11-28 ENCOUNTER — Observation Stay: Payer: BLUE CROSS/BLUE SHIELD

## 2015-11-28 DIAGNOSIS — R200 Anesthesia of skin: Secondary | ICD-10-CM | POA: Diagnosis not present

## 2015-11-28 LAB — GLUCOSE, CAPILLARY
GLUCOSE-CAPILLARY: 274 mg/dL — AB (ref 65–99)
GLUCOSE-CAPILLARY: 275 mg/dL — AB (ref 65–99)

## 2015-11-28 MED ORDER — CYCLOBENZAPRINE HCL 5 MG PO TABS: 5.0000 mg | ORAL_TABLET | Freq: Three times a day (TID) | ORAL | Status: DC | PRN

## 2015-11-28 MED ORDER — ATORVASTATIN CALCIUM 80 MG PO TABS: 80.0000 mg | ORAL_TABLET | Freq: Every day | ORAL | Status: DC

## 2015-11-28 NOTE — Discharge Summary (Signed)
[facility] [place] [facility]   PATIENT NAME: [name]    MR#:  [id number]  DATE OF BIRTH:  05-Mar-1966  DATE OF ADMISSION:  11/27/2015 ADMITTING PHYSICIAN: Houston Siren, MD  DATE OF DISCHARGE: 11/28/2015  PRIMARY CARE PHYSICIAN: [name], MD    ADMISSION DIAGNOSIS:  Left sided numbness [R20.0] Cerebral infarction due to unspecified mechanism [I63.9]  DISCHARGE DIAGNOSIS:  Active Problems:   CVA (cerebral infarction)   SECONDARY DIAGNOSIS:   Past Medical History  Diagnosis Date  . HTN (hypertension)   . Diabetes (HCC)   . Hyperlipidemia   . Depression   . Stroke (HCC)   . Epilepsy (HCC)   . Seizures [facility])     HOSPITAL COURSE:   50 year old female with past medical history of previous CVA, seizures, hypertension, diabetes, hyperlipidemia who presented to the hospital due to left-sided numbness and tingling.  #1 [name]- with tingling on the same side as she had prior infarct - Appreciate Neuro consult, MRI brain with no new infarcts- old right MCA infarct noted - cont asa, add statin, LDL 131 - carotid dopplers and ECHO were done during September 2016 which did not show acute findings  - for her leg spasms- continue flexeril.  #2 essential hypertension - BP is better controlled today. Continue lisinopril/HCTZ.  #3 history of seizures-continue Depakote.  #4 diabetes type 2- uncontrolled, Hba1c is 13.5 Noncompliant with meds -continue Levemir, metformin Discussed importance of taking her meds.  Discharge today. If needed outpatient physical therapy and occupational therapy.   DISCHARGE CONDITIONS:   Stable  CONSULTS OBTAINED:  Treatment Team:  Kym Groom, MD Thana Farr, MD  DRUG ALLERGIES:  No Known Allergies  DISCHARGE MEDICATIONS:   Current Discharge Medication List    START taking these medications   Details  atorvastatin (LIPITOR) 80 MG tablet Take 1 tablet (80 mg total) by mouth daily. Qty:  30 tablet, Refills: 2    cyclobenzaprine (FLEXERIL) 5 MG tablet Take 1 tablet (5 mg total) by mouth 3 (three) times daily as needed for muscle spasms. Qty: 30 tablet, Refills: 0      CONTINUE these medications which have NOT CHANGED   Details  albuterol (PROVENTIL HFA;VENTOLIN HFA) 108 (90 BASE) MCG/ACT inhaler Inhale 2 puffs into the lungs every 6 (six) hours as needed for wheezing or shortness of breath. Qty: 1 Inhaler, Refills: 2    aspirin EC 325 MG EC tablet Take 1 tablet (325 mg total) by mouth daily. Qty: 30 tablet, Refills: 0    insulin detemir (LEVEMIR) 100 UNIT/ML injection Inject 0.25 mLs (25 Units total) into the skin at bedtime. Qty: 10 mL, Refills: 11    lisinopril-hydrochlorothiazide (PRINZIDE,ZESTORETIC) 20-12.5 MG per tablet Take 1 tablet by mouth daily. Qty: 30 tablet, Refills: 1    metFORMIN (GLUCOPHAGE) 1000 MG tablet Take 500 mg by mouth 2 (two) times daily with a meal.     valproic acid (DEPAKENE) 250 MG capsule Take 2 capsules (500 mg total) by mouth 2 (two) times daily. Qty: 120 capsule, Refills: 0         DISCHARGE INSTRUCTIONS:   1. PCP f/u in 1-2 weeks  If you experience worsening of your admission symptoms, develop shortness of breath, life threatening emergency, suicidal or homicidal thoughts you must seek medical attention immediately by calling 911 or calling your MD immediately  if symptoms less severe.  You Must read complete instructions/literature along with all the possible adverse reactions/side effects for all the Medicines  you take and that have been prescribed to you. Take any new Medicines after you have completely understood and accept all the possible adverse reactions/side effects.   Please note  You were cared for by a hospitalist during your hospital stay. If you have any questions about your discharge medications or the care you received while you were in the hospital after you are discharged, you can call the unit and asked to  speak with the hospitalist on call if the hospitalist that took care of you is not available. Once you are discharged, your primary care physician will handle any further medical issues. Please note that NO REFILLS for any discharge medications will be authorized once you are discharged, as it is imperative that you return to your primary care physician (or establish a relationship with a primary care physician if you do not have one) for your aftercare needs so that they can reassess your need for medications and monitor your lab values.    Today   CHIEF COMPLAINT:   Chief Complaint  Patient presents with  . Code Stroke    VITAL SIGNS:  Blood pressure 141/81, pulse 96, temperature 98.7 F (37.1 C), temperature source Oral, resp. rate 16, height  (1.575 m), weight 110.587 kg (243 lb 12.8 oz), last menstrual period 11/11/2015, SpO2 94 %.  I/O:   Intake/Output Summary (Last 24 hours) at 11/28/15 1157 Last data filed at 11/28/15 0800  Gross per 24 hour  Intake    720 ml  Output      0 ml  Net    720 ml    PHYSICAL EXAMINATION:   Physical Exam  GENERAL:  50 y.o.-year-old obese patient lying in the bed with no acute distress.  EYES: Pupils equal, round, reactive to light and accommodation. No scleral icterus. Extraocular muscles intact.  HEENT: Head atraumatic, normocephalic. Oropharynx and nasopharynx clear.  NECK:  Supple, no jugular venous distention. No thyroid enlargement, no tenderness.  LUNGS: Normal breath sounds bilaterally, no wheezing, rales,rhonchi or crepitation. No use of accessory muscles of respiration.  CARDIOVASCULAR: S1, S2 normal. No murmurs, rubs, or gallops.  ABDOMEN: Soft, non-tender, non-distended. Bowel sounds present. No organomegaly or mass.  EXTREMITIES: No pedal edema, cyanosis, or clubbing.  NEUROLOGIC: Cranial nerves II through XII are intact. Muscle strength 5/5 on right side and 4/5 on the left. Sensation intact. Gait not checked.  Complains of  some spasms of left calf- improved now. Still has some tingling PSYCHIATRIC: The patient is alert and oriented x 3.  SKIN: No obvious rash, lesion, or ulcer.   DATA REVIEW:   CBC  Recent Labs Lab 11/27/15 0916  WBC 6.7  HGB 11.4*  HCT 36.4  PLT PLATELETS APPEAR ADEQUATE    Chemistries   Recent Labs Lab 11/27/15 0916  NA 134*  K 3.8  CL 102  CO2 22  GLUCOSE 307*  BUN 9  CREATININE 0.69  CALCIUM 8.6*  AST 15  ALT 14  ALKPHOS 59  BILITOT 0.7    Cardiac Enzymes  Recent Labs Lab 11/27/15 0916  TROPONINI <0.03    EXAM: MRI HEAD WITHOUT CONTRAST  TECHNIQUE: Multiplanar, multiecho pulse sequences of the brain and surrounding structures were obtained without intravenous contrast.  COMPARISON: CT head without contrast 11/27/2015 and MRI brain 11/11/2015.  FINDINGS: A posterior right MCA territory infarct is stable. There is encephalomalacia with cortical laminar necrosis. Ex vacuo dilation of the right lateral ventricle is noted. The infarct involves the right temporal lobe, right  parietal lobe, and posterior right frontal lobe. Associated white matter changes and will layering degeneration are noted.  The diffusion-weighted images demonstrate no acute infarct. Mild white matter changes are present on the left.  The ventricles are proportionate to the degree of atrophy. Insert pass fluid  The internal auditory canals are within normal limits bilaterally. Asymmetric white matter changes are noted on the right.  Flow is present in the major intracranial arteries. The globes and orbits are intact. Mild mucosal thickening is present in the right greater than left maxillary sinus. The remaining paranasal sinuses and the mastoid air cells are clear.  The skullbase is within normal limits. Midline sagittal images are otherwise unremarkable.  IMPRESSION: 1. Stable remote posterior right MCA territory infarct. 2. No acute intracranial  abnormality. 3. Mild chronic atrophy and white matter changes otherwise.    Microbiology Results  Results for orders placed or performed during the hospital encounter of 03/17/14  MRSA PCR Screening     Status: None   Collection Time: 03/17/14  2:05 PM  Result Value Ref Range Status   MRSA by PCR NEGATIVE NEGATIVE Final    Comment:        The GeneXpert MRSA Assay (FDA approved for NASAL specimens only), is one component of a comprehensive MRSA colonization surveillance program. It is not intended to diagnose MRSA infection nor to guide or monitor treatment for MRSA infections.    RADIOLOGY:    EKG:   Orders placed or performed during the hospital encounter of 11/27/15  . ED EKG  . ED EKG      Management plans discussed with the patient, family and they are in agreement.  CODE STATUS:     Code Status Orders        Start     Ordered   11/27/15 1236  Full code   Continuous     11/27/15 1235    Code Status History    Date Active Date Inactive Code Status Order ID Comments User Context   06/14/2015  6:20 AM 06/15/2015 10:14 PM Full Code [id number]  Arnaldo NatalMichael S Diamond, MD Inpatient   05/19/2015  1:11 AM 05/19/2015  8:41 PM Full Code [id number]  Crissie FiguresEdavally N Reddy, MD Inpatient      TOTAL TIME TAKING CARE OF THIS PATIENT: 38 minutes.    Enid BaasKALISETTI,Castella Lerner M.D on 11/28/2015 at 11:57 AM  Between 7am to 6pm - Pager - [phone]  After 6pm go to [email or web address] - password [email or web address] [facility]  [facility]  Office  [phone]  CC: Primary care physician; Marikay AlarEric Sonnenberg, MD

## 2015-11-28 NOTE — Progress Notes (Signed)
Inpatient Diabetes Program Recommendations  AACE/ADA: New Consensus Statement on Inpatient Glycemic Control (2015)  Target Ranges:  Prepandial:   less than 140 mg/dL      Peak postprandial:   less than 180 mg/dL (1-2 hours)      Critically ill patients:  140 - 180 mg/dL   Review of Glycemic ControlResults for [name], [name] (MRN [id number]) as of 11/28/2015 10:51  Ref. Range 06/14/2015 03:16 11/27/2015 09:16  Hemoglobin A1C Latest Ref Range: 4.0-6.0 % 12.5 (H) 13.5 (H)  Results for [name], [name] (MRN [id number]) as of 11/28/2015 10:51  Ref. Range 11/27/2015 16:08 11/27/2015 21:14 11/28/2015 07:20  Glucose-Capillary Latest Ref Range: 65-99 mg/dL 621355 (H) 308377 (H) 657274 (H)    Diabetes history: Type 2 diabetes Outpatient Diabetes medications: Levemir 25 units q HS Current orders for Inpatient glycemic control:  Levemir 25 units q HS, and Novolog sensitive tid with meals and HS  Inpatient Diabetes Program Recommendations:    Spoke with pt. Regarding home diabetes management.  She states that she has not been on insulin for long and that it was started on last hospitalization.  It appears that this hospitalization was in September of 2016.  A1C is actually higher than it was during last admission.  I asked patient if she was taking the Levemir at home and she states yes.  Encouraged her to make appointment with primary care provider so that adjustments can be made to improve blood sugars.  Explained that blood sugars should be less than 120 mg/dL in the morning and less than 180 mg/dL after eating. Patient seemed surprised stating, "I've never seen blood sugars that low".  She does endorse symptoms of hyperglycemia including thirst, fatigue and frequent urination.  She would also benefit from further education regarding diabetes management at [facility].  Patient is agreeable to attend.   May consider increasing Levemir to 35 units q HS.   Thank,s Beryl MeagerJenny Opal Dinning, RN, BC-ADM Inpatient Diabetes  Coordinator Pager [phone] (8a-5p)

## 2015-11-28 NOTE — Progress Notes (Signed)
Subjective: Reports lower extremity spasms some improved.  Has not requested Flexeril.    Objective: Current vital signs: BP 113/83 mmHg  Pulse 91  Temp(Src) 98.2 F (36.8 C) (Oral)  Resp 16  Ht  (1.575 m)  Wt 110.587 kg (243 lb 12.8 oz)  BMI 44.58 kg/m2  SpO2 98%  LMP 11/11/2015 (Approximate) Vital signs in last 24 hours: Temp:  [97.5 F (36.4 C)-98.5 F (36.9 C)] 98.2 F (36.8 C) (03/13 0807) Pulse Rate:  [84-101] 91 (03/13 0807) Resp:  [16-23] 16 (03/13 0446) BP: (109-199)/(41-159) 113/83 mmHg (03/13 0807) SpO2:  [94 %-100 %] 98 % (03/13 0807) Weight:  [110.587 kg (243 lb 12.8 oz)] 110.587 kg (243 lb 12.8 oz) (03/12 0912)  Intake/Output from previous day: 03/12 0701 - 03/13 0700 In: 480 [P.O.:480] Out: -  Intake/Output this shift:   Nutritional status: Diet heart healthy/carb modified Room service appropriate?: Yes; Fluid consistency:: Thin  Neurologic Exam: Mental Status: Alert, oriented, thought content appropriate. Speech fluent without evidence of aphasia. Able to follow 3 step commands without difficulty. Cranial Nerves: II: Discs flat bilaterally; Visual fields grossly normal, pupils equal, round, reactive to light and accommodation III,IV, VI: ptosis not present, extra-ocular motions intact bilaterally V,VII: smile symmetric, facial light touch sensation normal bilaterally VIII: hearing normal bilaterally IX,X: gag reflex present XI: bilateral shoulder shrug XII: midline tongue extension Motor: OOB in chair.  Moves all extremities against gravity.     Lab Results: Basic Metabolic Panel:  Recent Labs Lab 11/27/15 0916  NA 134*  K 3.8  CL 102  CO2 22  GLUCOSE 307*  BUN 9  CREATININE 0.69  CALCIUM 8.6*    Liver Function Tests:  Recent Labs Lab 11/27/15 0916  AST 15  ALT 14  ALKPHOS 59  BILITOT 0.7  PROT 7.0  ALBUMIN 3.8   No results for input(s): LIPASE, AMYLASE in the last 168 hours. No results for input(s): AMMONIA in the  last 168 hours.  CBC:  Recent Labs Lab 11/27/15 0916  WBC 6.7  NEUTROABS 4.0  HGB 11.4*  HCT 36.4  MCV 61.4*  PLT PLATELETS APPEAR ADEQUATE    Cardiac Enzymes:  Recent Labs Lab 11/27/15 0916  TROPONINI <0.03    Lipid Panel:  Recent Labs Lab 11/27/15 0916  CHOL 205*  TRIG 94  HDL 55  CHOLHDL 3.7  VLDL 19  LDLCALC 161*    CBG:  Recent Labs Lab 11/27/15 1608 11/27/15 2114 11/28/15 0720  GLUCAP 355* 377* 274*    Microbiology: Results for orders placed or performed during the hospital encounter of 03/17/14  MRSA PCR Screening     Status: None   Collection Time: 03/17/14  2:05 PM  Result Value Ref Range Status   MRSA by PCR NEGATIVE NEGATIVE Final    Comment:        The GeneXpert MRSA Assay (FDA approved for NASAL specimens only), is one component of a comprehensive MRSA colonization surveillance program. It is not intended to diagnose MRSA infection nor to guide or monitor treatment for MRSA infections.    Coagulation Studies:  Recent Labs  11/27/15 0916  LABPROT 13.3  INR 0.99    Imaging: Ct Head Wo Contrast  11/27/2015  CLINICAL DATA:  Code stroke. Leg tremors beginning at 6:30 a.m. today. Left leg heaviness. EXAM: CT HEAD WITHOUT CONTRAST TECHNIQUE: Contiguous axial images were obtained from the base of the skull through the vertex without intravenous contrast. COMPARISON:  MRI brain 11/11/2015. CT head without contrast 11/11/2015.  FINDINGS: The remote posterior right MCA territory infarct encephalomalacia is again noted. This extends from the right temporal lobe into the posterior right frontal and parietal lobes. There is ex vacuo dilation of the right lateral ventricle. There is some involvement of the basal ganglia as well. No acute cortical infarct or hemorrhage is present. No significant white matter disease is present on the left. No significant extra-axial fluid collection is present. The paranasal sinuses and mastoid air cells are  clear. Atherosclerotic calcifications are present within the cavernous internal carotid arteries bilaterally, right greater than left. Dural calcifications within the middle cranial fossa bilaterally are stable. The calvarium is intact. No significant extra-axial, the no significant extracranial soft tissue lesions are present. IMPRESSION: 1. No acute intracranial abnormality or significant interval change. 2. Stable posterior right MCA territory infarct without definite extension. 3. Atherosclerosis. These results were called by telephone at the time of interpretation on 11/27/2015 at 9:33 am to Dr. Jene EveryOBERT KINNER , who verbally acknowledged these results. Electronically Signed   By: Marin Robertshristopher  Mattern M.D.   On: 11/27/2015 09:35    Medications:  I have reviewed the patient's current medications. Scheduled: . aspirin EC  325 mg Oral Daily  . enoxaparin (LOVENOX) injection  40 mg Subcutaneous Q12H  . lisinopril  20 mg Oral Daily   And  . hydrochlorothiazide  12.5 mg Oral Daily  . Influenza vac split quadrivalent PF  0.5 mL Intramuscular Tomorrow-1000  . insulin aspart  0-5 Units Subcutaneous QHS  . insulin aspart  0-9 Units Subcutaneous TID WC  . insulin detemir  25 Units Subcutaneous QHS  . valproic acid  500 mg Oral BID    Assessment/Plan: Patient improving.  Reports that tingling continues.  MRI of the brain pending.    Recommendations: 1.  Will follow up results of MRI 2.  Continue ASA 3.  Flexeril prn for spasms.      Thana FarrLeslie Lavel Rieman, MD Neurology [phone] 11/28/2015  9:09 AM

## 2015-12-02 ENCOUNTER — Ambulatory Visit (INDEPENDENT_AMBULATORY_CARE_PROVIDER_SITE_OTHER): Payer: BLUE CROSS/BLUE SHIELD | Admitting: Family Medicine

## 2015-12-02 ENCOUNTER — Encounter: Payer: Self-pay | Admitting: Family Medicine

## 2015-12-02 VITALS — BP 136/88 | HR 91 | Temp 98.1°F | Ht 64.0 in | Wt 239.2 lb

## 2015-12-02 DIAGNOSIS — I63411 Cerebral infarction due to embolism of right middle cerebral artery: Secondary | ICD-10-CM

## 2015-12-02 DIAGNOSIS — F419 Anxiety disorder, unspecified: Secondary | ICD-10-CM

## 2015-12-02 DIAGNOSIS — F418 Other specified anxiety disorders: Secondary | ICD-10-CM | POA: Diagnosis not present

## 2015-12-02 DIAGNOSIS — Z794 Long term (current) use of insulin: Secondary | ICD-10-CM

## 2015-12-02 DIAGNOSIS — F32A Depression, unspecified: Secondary | ICD-10-CM

## 2015-12-02 DIAGNOSIS — E1165 Type 2 diabetes mellitus with hyperglycemia: Secondary | ICD-10-CM

## 2015-12-02 DIAGNOSIS — F329 Major depressive disorder, single episode, unspecified: Secondary | ICD-10-CM | POA: Diagnosis not present

## 2015-12-02 MED ORDER — INSULIN DETEMIR 100 UNIT/ML ~~LOC~~ SOLN: 22.0000 [IU] | Freq: Every day | SUBCUTANEOUS | Status: DC

## 2015-12-02 NOTE — Assessment & Plan Note (Signed)
History of CVA. Recent hospitalization for possible [name] with no new infarct on MRI. She is neurologically at baseline per her report with no new neurological deficits since being discharged from the hospital. She needs significant risk factor reduction with her diabetes being out of control. We will continue to work on this. She is on Lipitor 80 mg daily. She is on blood pressure medication and her blood pressure is well-controlled today. She takes an aspirin as well. She is scheduled for follow-up with her neurologist on Monday for this issue. She is given return precautions.

## 2015-12-02 NOTE — Progress Notes (Signed)
Pre visit review using our clinic review tool, if applicable. No additional management support is needed unless otherwise documented below in the visit note. 

## 2015-12-02 NOTE — Patient Instructions (Signed)
Nice to meet you. We have scheduled an appointment for neurology follow-up next week. We're going to increase her Levemir to 22 units nightly. 2 should monitor your blood sugars over the next week. If they remain in the 200s and 300s please call us and we will work on increasing your diabetic medication. We are going to refer you to psychiatry and a psychologist for further evaluation of your anxiety and depression. If you develop an intent or plan to harm herself or thoughts of harming anybody else, new numbness or weakness, new headaches, vision changes, or any new or changing symptoms please seek medical attention.

## 2015-12-02 NOTE — Assessment & Plan Note (Signed)
Very uncontrolled. Last A1c in the 13 range. Patient reports taking her medicines daily. We will increase her Levemir to 22 units daily at bedtime. She'll continue her metformin. She'll continue to monitor blood sugars. If they are remaining in the 200s and 300s next week she will call us and let us know so we can titrate her insulin up. Discussed hypoglycemic protocol. Given return precautions.

## 2015-12-02 NOTE — Progress Notes (Signed)
Patient ID: [name], female   DOB: 17-Jul-1966, 50 y.o.   MRN: [id number]  [name] Alar, MD Phone: [phone]  [name] is a 50 y.o. female who presents today for new patient visit.  History of stroke: Patient recently hospitalized for lower extremity cramps and pain with left-sided tingling and sensory changes. She underwent workup for stroke in the hospital that did not reveal any new infarct on MRI. Did reveal her old infarct. On review of notes it appears that she has had left-sided hemi-neglect for some time. She notes numbness and tingling in her left arm and leg since prior to her hospitalization. No new numbness or tingling. No weakness. No recent headaches. She reports she has a history of seizures from her prior stroke that manifest themselves as headaches. She is on Depakene for this. Last had a headache in February. She is followed by neurology for these issues.  Diabetes: Patient states she is checking her blood sugars at home. States they're in the 200s and 300s. She does note polyuria and polydipsia. She reports she is taking Levemir 20 units at night. Also on metformin 500 mg twice daily. She notes infrequent hypoglycemia which she describes as feeling wobbly if she does not eat soon enough after taking her insulin. Notes she will eat or drink something and this will go away. She has no lightheadedness or chest pain or shortness of breath or palpitations with this. No neurological issues when this occurs. She typically eats one meal a day though does snack throughout the day on chips and peanuts.  Anxiety/depression: Patient notes she feels overly sad at times. Notes she feels nervous and overwhelmed as well. Notes this has been an issue since she had her stroke in 2014. She was previously on Zoloft for this and saw a therapist, though did not feel that the Zoloft was helpful and it actually made her tired. She notes the therapist was helpful. She does note having thoughts that she  would be better off dead, though has no plan or intent to harm herself. No HI.  Active Ambulatory Problems    Diagnosis Date Noted  . [name] (transient ischemic attack) 03/17/2014  . HTN (hypertension)   . Diabetes (HCC)   . Hyperlipidemia   . Severe obesity (BMI >= 40) (HCC) 03/19/2014  . Headache 05/19/2015  . Weakness of left side of body 05/19/2015  . H/O: CVA (cerebrovascular accident) 05/19/2015  . 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 Epilepsy (HCC) 06/17/2015  . CVA (cerebral infarction) 11/27/2015  . Anxiety and depression 12/02/2015   Resolved Ambulatory Problems    Diagnosis Date Noted  . Headache(784.0) 03/19/2014   Past Medical History  Diagnosis Date  . Depression   . Stroke (HCC)   . Seizures (HCC)   . Chickenpox   . Allergic rhinitis   . Urinary incontinence     Family History  Problem Relation Age of Onset  . Hypertension Mother   . Hypertension Father   . Diabetes Mellitus II Mother   . Pancreatic cancer Father     Social History   Social History  . Marital Status: Married    Spouse Name: N/A  . Number of Children: N/A  . Years of Education: N/A   Occupational History  . Not on file.   Social History Main Topics  . Smoking status: Never Smoker   . Smokeless tobacco: Not on file  . Alcohol Use: No  . Drug Use: No  . Sexual Activity: Not on file  Other Topics Concern  . Not on file   Social History Narrative    ROS   General:  Negative for nexplained weight loss, fever Skin: Negative for new or changing mole, sore that won't heal HEENT: Negative for trouble hearing, trouble seeing, ringing in ears, mouth sores, hoarseness, change in voice, dysphagia. CV:  Negative for chest pain, dyspnea, edema, palpitations Resp: Negative for cough, dyspnea, hemoptysis GI: Negative for nausea, vomiting, diarrhea, constipation, abdominal pain, melena, hematochezia. GU: Positive for frequent urination, incontinence Negative for dysuria, urinary  hesitance, hematuria, vaginal or penile discharge, polyuria, sexual difficulty, lumps in testicle or breasts MSK: Positive for muscle cramps, negative for joint pain or swelling Neuro: Positive for headaches, weakness, numbness, dizziness, negative for passing out/fainting Psych: Positive for depression, anxiety, negative for memory problems Positive for excessive thirst  Objective  Physical Exam Filed Vitals:   12/02/15 1010  BP: 136/88  Pulse: 91  Temp: 98.1 F (36.7 C)    BP Readings from Last 3 Encounters:  12/02/15 136/88  11/28/15 141/81  11/11/15 129/80   Wt Readings from Last 3 Encounters:  12/02/15 239 lb 3.2 oz (108.5 kg)  11/27/15 243 lb 12.8 oz (110.587 kg)  11/11/15 230 lb (104.327 kg)    Physical Exam  Constitutional: She is well-developed, well-nourished, and in no distress.  HENT:  Head: Normocephalic and atraumatic.  Right Ear: External ear normal.  Left Ear: External ear normal.  Mouth/Throat: Oropharynx is clear and moist. No oropharyngeal exudate.  Eyes: Conjunctivae are normal.  Neck: Neck supple.  Cardiovascular: Normal rate, regular rhythm and normal heart sounds.  Exam reveals no gallop and no friction rub.   No murmur heard. Pulmonary/Chest: Effort normal and breath sounds normal. No respiratory distress. She has no wheezes. She has no rales.  Abdominal: Soft. Bowel sounds are normal. She exhibits no distension. There is no tenderness. There is no rebound and no guarding.  Musculoskeletal: She exhibits no edema.  Mild muscular tenderness in bilateral lower extremities, no spasm noted  Lymphadenopathy:    She has no cervical adenopathy.  Neurological: She is alert.  CN 2-12 intact, 5/5 strength in bilateral biceps, triceps, grip, quads, hamstrings, plantar and dorsiflexion, sensation to light touch decreased in left UE and LE, sensation to light touch intact in right UE and LE, normal gait, 2+ patellar reflexes  Skin: Skin is warm. She is not  diaphoretic.  Psychiatric:  Mood depressed and anxious, affect flat     Assessment/Plan:   Diabetes Very uncontrolled. Last A1c in the 13 range. Patient reports taking her medicines daily. We will increase her Levemir to 22 units daily at bedtime. She'll continue her metformin. She'll continue to monitor blood sugars. If they are remaining in the 200s and 300s next week she will call us and let us know so we can titrate her insulin up. Discussed hypoglycemic protocol. Given return precautions.  Anxiety and depression Patient with significant anxiety and depression. She does have thoughts that she would be better off being dead, though no intent or plan to harm herself. No HI. I discussed medication with her, though she did not feel as though this was necessary at this time. Discussed referral to psychiatry and psychology for further evaluation and she was amenable to this. She was advised that if she ever developed an intent or plan to harm herself or anyone else she needed to seek medical attention immediately in the emergency room. Given return precautions.  CVA (cerebral infarction) History of CVA.  Recent hospitalization for possible [name] with no new infarct on MRI. She is neurologically at baseline per her report with no new neurological deficits since being discharged from the hospital. She needs significant risk factor reduction with her diabetes being out of control. We will continue to work on this. She is on Lipitor 80 mg daily. She is on blood pressure medication and her blood pressure is well-controlled today. She takes an aspirin as well. She is scheduled for follow-up with her neurologist on Monday for this issue. She is given return precautions.    Orders Placed This Encounter  Procedures  . Ambulatory referral to Psychiatry    Referral Priority:  Routine    Referral Type:  Psychiatric    Referral Reason:  Specialty Services Required    Requested Specialty:  Psychiatry     Number of Visits Requested:  1  . Ambulatory referral to Psychology    Referral Priority:  Routine    Referral Type:  Psychiatric    Referral Reason:  Specialty Services Required    Requested Specialty:  Psychology    Number of Visits Requested:  1    Meds ordered this encounter  Medications  . insulin detemir (LEVEMIR) 100 UNIT/ML injection    Sig: Inject 0.22 mLs (22 Units total) into the skin at bedtime.    Dispense:  10 mL    Refill:  11     [name] Alar, MD [facility]

## 2015-12-02 NOTE — Assessment & Plan Note (Signed)
Patient with significant anxiety and depression. She does have thoughts that she would be better off being dead, though no intent or plan to harm herself. No HI. I discussed medication with her, though she did not feel as though this was necessary at this time. Discussed referral to psychiatry and psychology for further evaluation and she was amenable to this. She was advised that if she ever developed an intent or plan to harm herself or anyone else she needed to seek medical attention immediately in the emergency room. Given return precautions.

## 2015-12-08 ENCOUNTER — Ambulatory Visit: Payer: BLUE CROSS/BLUE SHIELD | Admitting: Licensed Clinical Social Worker

## 2015-12-28 ENCOUNTER — Ambulatory Visit: Payer: BLUE CROSS/BLUE SHIELD | Admitting: *Deleted

## 2016-01-02 ENCOUNTER — Ambulatory Visit: Payer: BLUE CROSS/BLUE SHIELD | Admitting: Family Medicine

## 2016-05-02 ENCOUNTER — Emergency Department
Admission: EM | Admit: 2016-05-02 | Discharge: 2016-05-02 | Disposition: A | Discharge status: Home / Self Care | Payer: BLUE CROSS/BLUE SHIELD | Attending: Student | Admitting: Student

## 2016-05-02 ENCOUNTER — Encounter: Payer: Self-pay | Admitting: Emergency Medicine

## 2016-05-02 ENCOUNTER — Emergency Department: Payer: BLUE CROSS/BLUE SHIELD

## 2016-05-02 DIAGNOSIS — W228XXA Striking against or struck by other objects, initial encounter: Secondary | ICD-10-CM | POA: Insufficient documentation

## 2016-05-02 DIAGNOSIS — S91134A Puncture wound without foreign body of right lesser toe(s) without damage to nail, initial encounter: Secondary | ICD-10-CM

## 2016-05-02 DIAGNOSIS — I10 Essential (primary) hypertension: Secondary | ICD-10-CM | POA: Insufficient documentation

## 2016-05-02 DIAGNOSIS — R52 Pain, unspecified: Secondary | ICD-10-CM

## 2016-05-02 DIAGNOSIS — S91104A Unspecified open wound of right lesser toe(s) without damage to nail, initial encounter: Secondary | ICD-10-CM | POA: Insufficient documentation

## 2016-05-02 DIAGNOSIS — Y929 Unspecified place or not applicable: Secondary | ICD-10-CM | POA: Diagnosis not present

## 2016-05-02 DIAGNOSIS — Y999 Unspecified external cause status: Secondary | ICD-10-CM | POA: Diagnosis not present

## 2016-05-02 DIAGNOSIS — Z7984 Long term (current) use of oral hypoglycemic drugs: Secondary | ICD-10-CM | POA: Insufficient documentation

## 2016-05-02 DIAGNOSIS — Z8673 Personal history of transient ischemic attack (TIA), and cerebral infarction without residual deficits: Secondary | ICD-10-CM | POA: Diagnosis not present

## 2016-05-02 DIAGNOSIS — Y939 Activity, unspecified: Secondary | ICD-10-CM | POA: Insufficient documentation

## 2016-05-02 DIAGNOSIS — M79674 Pain in right toe(s): Secondary | ICD-10-CM | POA: Diagnosis present

## 2016-05-02 DIAGNOSIS — L03031 Cellulitis of right toe: Secondary | ICD-10-CM | POA: Insufficient documentation

## 2016-05-02 DIAGNOSIS — Z7982 Long term (current) use of aspirin: Secondary | ICD-10-CM | POA: Diagnosis not present

## 2016-05-02 DIAGNOSIS — E119 Type 2 diabetes mellitus without complications: Secondary | ICD-10-CM | POA: Diagnosis not present

## 2016-05-02 LAB — GLUCOSE, CAPILLARY: GLUCOSE-CAPILLARY: 351 mg/dL — AB (ref 65–99)

## 2016-05-02 MED ORDER — CEPHALEXIN 500 MG PO CAPS: 500.0000 mg | ORAL_CAPSULE | Freq: Four times a day (QID) | ORAL | 0 refills | Status: DC

## 2016-05-02 MED ORDER — HYDROCODONE-ACETAMINOPHEN 5-325 MG PO TABS
1.0000 | ORAL_TABLET | Freq: Once | ORAL | Status: AC
  Administered 2016-05-02: 1 via ORAL
  Filled 2016-05-02: qty 1

## 2016-05-02 MED ORDER — HYDROCODONE-ACETAMINOPHEN 5-325 MG PO TABS: 1.0000 | ORAL_TABLET | ORAL | 0 refills | Status: DC | PRN

## 2016-05-02 MED ORDER — SULFAMETHOXAZOLE-TRIMETHOPRIM 800-160 MG PO TABS: 1.0000 | ORAL_TABLET | Freq: Two times a day (BID) | ORAL | 0 refills | Status: DC

## 2016-05-02 NOTE — ED Notes (Signed)
Extensive teaching about importance of medication compliance and follow up with referrals done with patient and family.

## 2016-05-02 NOTE — ED Provider Notes (Signed)
[facility] Emergency Department Provider Note  ____________________________________________   None    (approximate)  I have reviewed the triage vital signs and the nursing notes.   HISTORY  Chief Complaint Foot Pain    HPI [name] is a 50 y.o. female is here with complaint of right fifth toe pain. Patient states that she kicked off her shoes and hit a toothpick a couple of days ago. Patient states that the area has continued to have some swelling and increased pain. Patient states that she completely removed the toothpick from her foot as it was not embedded into her foot. She denies having used any over-the-counter medications to her foot. In talking with her she is also not taken her diabetes medicine for the last 5 days including her insulin. She states that her reason for not taking the metformin is that it causes diarrhea. Patient also does not adhere to a diabetic diet. Patient states she is sure that she has had a tetanus shot within last 5 years from her primary care doctor. She rates her pain as 8 out of 10.   Past Medical History:  Diagnosis Date  . Allergic rhinitis   . Chickenpox   . Depression   . Diabetes (HCC)   . Epilepsy (HCC)   . Headache   . HTN (hypertension)   . Hyperlipidemia   . Seizures (HCC)   . Stroke (HCC)   . Urinary incontinence     Patient Active Problem List   Diagnosis Date Noted  . Anxiety and depression 12/02/2015  . CVA (cerebral infarction) 11/27/2015  . Epilepsy (HCC) 06/17/2015  . 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 Headache 05/19/2015  . Weakness of left side of body 05/19/2015  . H/O: CVA (cerebrovascular accident) 05/19/2015  . Severe obesity (BMI >= 40) (HCC) 03/19/2014  . HTN (hypertension)   . Diabetes (HCC)   . Hyperlipidemia   . [name] (transient ischemic attack) 03/17/2014    Past Surgical History:  Procedure Laterality Date  . CESAREAN SECTION      Prior to Admission medications     Medication Sig Start Date End Date Taking? Authorizing Provider  albuterol (PROVENTIL HFA;VENTOLIN HFA) 108 (90 BASE) MCG/ACT inhaler Inhale 2 puffs into the lungs every 6 (six) hours as needed for wheezing or shortness of breath. 08/14/15   Jene Every, MD  aspirin EC 325 MG EC tablet Take 1 tablet (325 mg total) by mouth daily. 03/19/14   Layne Benton, NP  atorvastatin (LIPITOR) 80 MG tablet Take 1 tablet (80 mg total) by mouth daily. 11/28/15   Enid Baas, MD  cephALEXin (KEFLEX) 500 MG capsule Take 1 capsule (500 mg total) by mouth 4 (four) times daily. 05/02/16   Tommi Rumps, PA-C  cyclobenzaprine (FLEXERIL) 5 MG tablet Take 1 tablet (5 mg total) by mouth 3 (three) times daily as needed for muscle spasms. 11/28/15   Enid Baas, MD  HYDROcodone-acetaminophen (NORCO/VICODIN) 5-325 MG tablet Take 1 tablet by mouth every 4 (four) hours as needed for moderate pain. 05/02/16   Tommi Rumps, PA-C  insulin detemir (LEVEMIR) 100 UNIT/ML injection Inject 0.22 mLs (22 Units total) into the skin at bedtime. 12/02/15   Glori Luis, MD  lisinopril-hydrochlorothiazide (PRINZIDE,ZESTORETIC) 20-12.5 MG per tablet Take 1 tablet by mouth daily. 04/19/15   Jene Every, MD  metFORMIN (GLUCOPHAGE) 1000 MG tablet Take 500 mg by mouth 2 (two) times daily with a meal.     Historical Provider, MD  sulfamethoxazole-trimethoprim (BACTRIM DS,SEPTRA DS) 800-160 MG tablet Take 1 tablet by mouth 2 (two) times daily. 05/02/16   Tommi Rumpshonda L Summers, PA-C  valproic acid (DEPAKENE) 250 MG capsule Take 2 capsules (500 mg total) by mouth 2 (two) times daily. Patient taking differently: Take 250 mg by mouth 2 (two) times daily.  06/15/15   Milagros LollSrikar Sudini, MD    Allergies Review of patient's allergies indicates no known allergies.  Family History  Problem Relation Age of Onset  . Hypertension Mother   . Diabetes Mellitus II Mother   . Hypertension Father   . Pancreatic cancer Father     Social  History Social History  Substance Use Topics  . Smoking status: Never Smoker  . Smokeless tobacco: Never Used  . Alcohol use No    Review of Systems Constitutional: No fever/chills Cardiovascular: Denies chest pain. Respiratory: Denies shortness of breath. Gastrointestinal:   No nausea, no vomiting.   Musculoskeletal: Positive for right foot pain. Skin: Positive skin infection right foot. Neurological: Negative for headaches, focal weakness or numbness.  10-point ROS otherwise negative.  ____________________________________________   PHYSICAL EXAM:  VITAL SIGNS: ED Triage Vitals  Enc Vitals Group     BP 05/02/16 0927 (!) 144/74     Pulse Rate 05/02/16 0927 97     Resp 05/02/16 0927 20     Temp 05/02/16 0927 98 F (36.7 C)     Temp Source 05/02/16 0927 Oral     SpO2 05/02/16 0927 97 %     Weight 05/02/16 0927 230 lb (104.3 kg)     Height 05/02/16 0927 5\' 2"  (1.575 m)     Head Circumference --      Peak Flow --      Pain Score 05/02/16 0928 8     Pain Loc --      Pain Edu? --      Excl. in GC? --     Constitutional: Alert and oriented. Well appearing and in no acute distress. Eyes: Conjunctivae are normal. PERRL. EOMI. Head: Atraumatic. Nose: No congestion/rhinnorhea. Neck: No stridor.   Cardiovascular: Normal rate, regular rhythm. Grossly normal heart sounds.  Good peripheral circulation. Respiratory: Normal respiratory effort.  No retractions. Lungs CTAB. Musculoskeletal: No lower extremity tenderness nor edema.  No joint effusions. Skin:  Skin is warm, dry. Single open pinpoint wound on the right lateral foot below the fifth digit. No active drainage is seen. Skin surrounding is moderately erythematous and extremely tender to touch. Patient reluctant to allow examiner to touch foot fully. Pulses present. Psychiatric: Mood and affect are normal. Speech and behavior are normal.  ____________________________________________   LABS (all labs ordered are listed,  but only abnormal results are displayed)  Labs Reviewed  GLUCOSE, CAPILLARY - Abnormal; Notable for the following:       Result Value   Glucose-Capillary 351 (*)    All other components within normal limits  CBG MONITORING, ED   ____________________________________________  RADIOLOGY   Right fifth toe per radiologist no foreign body noted. I, Tommi Rumpshonda L Summers, personally viewed and evaluated these images (plain radiographs) as part of my medical decision making, as well as reviewing the written report by the radiologist.  ____________________________________________   PROCEDURES  Procedure(s) performed: None  Procedures  Critical Care performed: No  ____________________________________________   INITIAL IMPRESSION / ASSESSMENT AND PLAN / ED COURSE  Pertinent labs & imaging results that were available during my care of the patient were reviewed by me and considered in my  medical decision making (see chart for details).    Clinical Course  [facility] not only for continued diabetes foot care but for follow-up on this injury. Patient was given a prescription for Keflex 500 mg 4 times a day, Bactrim DS twice a day for 10 days and Norco as needed for pain. Blood sugar was checked in the ER today and was 351. Patient is to call her primary care doctor today to get refills on her medication and adhere to her diabetic diet. We discussed if infection continues to get worse the possibility of losing her toe is always there since she is diabetic and uncontrolled.   ____________________________________________   FINAL CLINICAL IMPRESSION(S) / ED DIAGNOSES  Final diagnoses:  Pain  Puncture wound of fifth toe of right foot, initial encounter  Cellulitis of toe of right foot      NEW MEDICATIONS STARTED DURING THIS VISIT:  Discharge Medication List as of 05/02/2016 11:09 AM    START taking these medications   Details  cephALEXin  (KEFLEX) 500 MG capsule Take 1 capsule (500 mg total) by mouth 4 (four) times daily., Starting Wed 05/02/2016, Print    HYDROcodone-acetaminophen (NORCO/VICODIN) 5-325 MG tablet Take 1 tablet by mouth every 4 (four) hours as needed for moderate pain., Starting Wed 05/02/2016, Print    sulfamethoxazole-trimethoprim (BACTRIM DS,SEPTRA DS) 800-160 MG tablet Take 1 tablet by mouth 2 (two) times daily., Starting Wed 05/02/2016, Print         Note:  This document was prepared using Dragon voice recognition software and may include unintentional dictation errors.    Tommi Rumpshonda L Summers, PA-C 05/02/16 1339    Gayla DossEryka A Gayle, MD 05/02/16 [phone]

## 2016-05-02 NOTE — ED Triage Notes (Signed)
States she kicked off her shoes and hit a toothpick   Having pain with some swelling to right 5 th toe and lateral foot

## 2016-05-02 NOTE — Discharge Instructions (Signed)
Begin taking medication today. You are on 2 antibiotics for foot infection. It is necessary for you take both as diabetic foot infections are difficult to take care of. You are also given a prescription for Norco as needed for pain. You will  need to set up an appointment with Dr. Alberteen Spindleline for foot care.Call that office today.   Also contact your primary care doctor for your diabetes medicine. Return to the emergency room if any severe worsening of your symptoms such as fever over 101.

## 2016-05-03 ENCOUNTER — Telehealth: Payer: Self-pay | Admitting: Family Medicine

## 2016-05-03 MED ORDER — INSULIN DETEMIR 100 UNIT/ML ~~LOC~~ SOLN: 22.0000 [IU] | Freq: Every day | SUBCUTANEOUS | 6 refills | Status: DC

## 2016-05-03 NOTE — Telephone Encounter (Signed)
Pt called needing a Rx refill for insulin detemir (LEVEMIR) 100 UNIT/ML injection.  Pharmacy is [id number] - [place] ([place]), [place] - [address]  Call pt @ [phone]. Thank you!

## 2016-05-03 NOTE — Telephone Encounter (Signed)
Medication refill

## 2016-05-11 ENCOUNTER — Inpatient Hospital Stay
Admission: AD | Admit: 2016-05-11 | Discharge: 2016-05-15 | DRG: 638 | Disposition: A | Discharge status: Home / Self Care | Payer: BLUE CROSS/BLUE SHIELD | Source: Ambulatory Visit | Attending: Internal Medicine | Admitting: Internal Medicine

## 2016-05-11 ENCOUNTER — Other Ambulatory Visit: Payer: Self-pay | Admitting: Podiatry

## 2016-05-11 DIAGNOSIS — Z800 Family history of malignant neoplasm of digestive organs: Secondary | ICD-10-CM | POA: Diagnosis not present

## 2016-05-11 DIAGNOSIS — Z7982 Long term (current) use of aspirin: Secondary | ICD-10-CM | POA: Diagnosis not present

## 2016-05-11 DIAGNOSIS — E785 Hyperlipidemia, unspecified: Secondary | ICD-10-CM | POA: Diagnosis present

## 2016-05-11 DIAGNOSIS — H548 Legal blindness, as defined in USA: Secondary | ICD-10-CM | POA: Diagnosis present

## 2016-05-11 DIAGNOSIS — Z79899 Other long term (current) drug therapy: Secondary | ICD-10-CM

## 2016-05-11 DIAGNOSIS — L02611 Cutaneous abscess of right foot: Secondary | ICD-10-CM | POA: Diagnosis present

## 2016-05-11 DIAGNOSIS — Z8249 Family history of ischemic heart disease and other diseases of the circulatory system: Secondary | ICD-10-CM

## 2016-05-11 DIAGNOSIS — L97519 Non-pressure chronic ulcer of other part of right foot with unspecified severity: Secondary | ICD-10-CM | POA: Diagnosis present

## 2016-05-11 DIAGNOSIS — I10 Essential (primary) hypertension: Secondary | ICD-10-CM | POA: Diagnosis present

## 2016-05-11 DIAGNOSIS — L03119 Cellulitis of unspecified part of limb: Principal | ICD-10-CM

## 2016-05-11 DIAGNOSIS — Z6841 Body Mass Index (BMI) 40.0 and over, adult: Secondary | ICD-10-CM | POA: Diagnosis not present

## 2016-05-11 DIAGNOSIS — Z794 Long term (current) use of insulin: Secondary | ICD-10-CM

## 2016-05-11 DIAGNOSIS — L089 Local infection of the skin and subcutaneous tissue, unspecified: Secondary | ICD-10-CM

## 2016-05-11 DIAGNOSIS — E11621 Type 2 diabetes mellitus with foot ulcer: Secondary | ICD-10-CM | POA: Diagnosis present

## 2016-05-11 DIAGNOSIS — Z95828 Presence of other vascular implants and grafts: Secondary | ICD-10-CM

## 2016-05-11 DIAGNOSIS — G40909 Epilepsy, unspecified, not intractable, without status epilepticus: Secondary | ICD-10-CM | POA: Diagnosis present

## 2016-05-11 DIAGNOSIS — L02619 Cutaneous abscess of unspecified foot: Secondary | ICD-10-CM

## 2016-05-11 DIAGNOSIS — Z833 Family history of diabetes mellitus: Secondary | ICD-10-CM | POA: Diagnosis not present

## 2016-05-11 DIAGNOSIS — Z8673 Personal history of transient ischemic attack (TIA), and cerebral infarction without residual deficits: Secondary | ICD-10-CM

## 2016-05-11 DIAGNOSIS — S91331A Puncture wound without foreign body, right foot, initial encounter: Secondary | ICD-10-CM

## 2016-05-11 LAB — COMPREHENSIVE METABOLIC PANEL
ALT: 16 U/L (ref 14–54)
ANION GAP: 7 (ref 5–15)
AST: 17 U/L (ref 15–41)
Albumin: 3.8 g/dL (ref 3.5–5.0)
Alkaline Phosphatase: 62 U/L (ref 38–126)
BILIRUBIN TOTAL: 0.3 mg/dL (ref 0.3–1.2)
BUN: 12 mg/dL (ref 6–20)
CHLORIDE: 101 mmol/L (ref 101–111)
CO2: 26 mmol/L (ref 22–32)
Calcium: 8.8 mg/dL — ABNORMAL LOW (ref 8.9–10.3)
Creatinine, Ser: 0.77 mg/dL (ref 0.44–1.00)
GFR calc Af Amer: >60 mL/min (ref >60)
Glucose, Bld: 231 mg/dL — ABNORMAL HIGH (ref 65–99)
POTASSIUM: 3.8 mmol/L (ref 3.5–5.1)
Sodium: 134 mmol/L — ABNORMAL LOW (ref 135–145)
TOTAL PROTEIN: 7.3 g/dL (ref 6.5–8.1)

## 2016-05-11 LAB — CBC
HCT: 35.1 % (ref 35.0–47.0)
Hemoglobin: 11.5 g/dL — ABNORMAL LOW (ref 12.0–16.0)
MCH: 20.3 pg — ABNORMAL LOW (ref 26.0–34.0)
MCHC: 32.7 g/dL (ref 32.0–36.0)
MCV: 62 fL — AB (ref 80.0–100.0)
PLATELETS: 179 10*3/uL (ref 150–440)
RBC: 5.67 MIL/uL — ABNORMAL HIGH (ref 3.80–5.20)
RDW: 18.3 % — AB (ref 11.5–14.5)
WBC: 7.9 10*3/uL (ref 3.6–11.0)

## 2016-05-11 LAB — GLUCOSE, CAPILLARY
GLUCOSE-CAPILLARY: 243 mg/dL — AB (ref 65–99)
GLUCOSE-CAPILLARY: 243 mg/dL — AB (ref 65–99)

## 2016-05-11 MED ORDER — ASPIRIN EC 81 MG PO TBEC
81.0000 mg | DELAYED_RELEASE_TABLET | Freq: Every day | ORAL | Status: DC
  Administered 2016-05-12 – 2016-05-15 (×4): 81 mg via ORAL
  Filled 2016-05-11 (×4): qty 1

## 2016-05-11 MED ORDER — VANCOMYCIN HCL IN DEXTROSE 1-5 GM/200ML-% IV SOLN
1000.0000 mg | Freq: Once | INTRAVENOUS | Status: AC
  Administered 2016-05-11: 1000 mg via INTRAVENOUS
  Filled 2016-05-11: qty 200

## 2016-05-11 MED ORDER — INSULIN ASPART 100 UNIT/ML ~~LOC~~ SOLN
0.0000 [IU] | Freq: Three times a day (TID) | SUBCUTANEOUS | Status: DC
  Administered 2016-05-11: 2 [IU] via SUBCUTANEOUS

## 2016-05-11 MED ORDER — HYDROCODONE-ACETAMINOPHEN 5-325 MG PO TABS
1.0000 | ORAL_TABLET | ORAL | Status: DC | PRN
  Administered 2016-05-11: 1 via ORAL
  Filled 2016-05-11: qty 1

## 2016-05-11 MED ORDER — ACETAMINOPHEN 325 MG PO TABS: 650.0000 mg | ORAL_TABLET | Freq: Four times a day (QID) | ORAL | Status: DC | PRN

## 2016-05-11 MED ORDER — ALBUTEROL SULFATE (2.5 MG/3ML) 0.083% IN NEBU: 3.0000 mL | INHALATION_SOLUTION | Freq: Four times a day (QID) | RESPIRATORY_TRACT | Status: DC | PRN

## 2016-05-11 MED ORDER — ENOXAPARIN SODIUM 40 MG/0.4ML ~~LOC~~ SOLN
40.0000 mg | Freq: Two times a day (BID) | SUBCUTANEOUS | Status: DC
  Administered 2016-05-11 – 2016-05-14 (×7): 40 mg via SUBCUTANEOUS
  Filled 2016-05-11 (×7): qty 0.4

## 2016-05-11 MED ORDER — ONDANSETRON HCL 4 MG/2ML IJ SOLN: 4.0000 mg | Freq: Four times a day (QID) | INTRAMUSCULAR | Status: DC | PRN

## 2016-05-11 MED ORDER — NITROGLYCERIN 2 % TD OINT
1.0000 [in_us] | TOPICAL_OINTMENT | Freq: Four times a day (QID) | TRANSDERMAL | Status: DC
Start: 2016-05-11 — End: 2016-05-14
  Administered 2016-05-11 – 2016-05-12 (×5): 1 [in_us] via TOPICAL
  Filled 2016-05-11: qty 30

## 2016-05-11 MED ORDER — SODIUM CHLORIDE 0.45 % IV SOLN
INTRAVENOUS | Status: DC
  Administered 2016-05-11: 21:00:00 via INTRAVENOUS

## 2016-05-11 MED ORDER — INSULIN DETEMIR 100 UNIT/ML ~~LOC~~ SOLN
22.0000 [IU] | Freq: Every day | SUBCUTANEOUS | Status: DC
  Administered 2016-05-11 – 2016-05-14 (×4): 22 [IU] via SUBCUTANEOUS
  Filled 2016-05-11 (×5): qty 0.22

## 2016-05-11 MED ORDER — LISINOPRIL-HYDROCHLOROTHIAZIDE 20-12.5 MG PO TABS: 1.0000 | ORAL_TABLET | Freq: Every day | ORAL | Status: DC

## 2016-05-11 MED ORDER — SODIUM CHLORIDE 0.9% FLUSH
3.0000 mL | Freq: Two times a day (BID) | INTRAVENOUS | Status: DC
  Administered 2016-05-12 – 2016-05-14 (×3): 3 mL via INTRAVENOUS

## 2016-05-11 MED ORDER — LISINOPRIL 20 MG PO TABS
20.0000 mg | ORAL_TABLET | Freq: Every day | ORAL | Status: DC
  Administered 2016-05-11 – 2016-05-15 (×5): 20 mg via ORAL
  Filled 2016-05-11 (×5): qty 1

## 2016-05-11 MED ORDER — HYDROCHLOROTHIAZIDE 12.5 MG PO CAPS
12.5000 mg | ORAL_CAPSULE | Freq: Every day | ORAL | Status: DC
  Administered 2016-05-11 – 2016-05-15 (×5): 12.5 mg via ORAL
  Filled 2016-05-11 (×5): qty 1

## 2016-05-11 MED ORDER — VANCOMYCIN HCL 10 G IV SOLR
1250.0000 mg | Freq: Two times a day (BID) | INTRAVENOUS | Status: DC
  Administered 2016-05-12: 1250 mg via INTRAVENOUS
  Filled 2016-05-11 (×4): qty 1250

## 2016-05-11 MED ORDER — INSULIN ASPART 100 UNIT/ML ~~LOC~~ SOLN
0.0000 [IU] | Freq: Every day | SUBCUTANEOUS | Status: DC
  Administered 2016-05-11 – 2016-05-12 (×2): 2 [IU] via SUBCUTANEOUS
  Filled 2016-05-11: qty 2
  Filled 2016-05-11: qty 5
  Filled 2016-05-11: qty 2

## 2016-05-11 MED ORDER — ONDANSETRON HCL 4 MG PO TABS: 4.0000 mg | ORAL_TABLET | Freq: Four times a day (QID) | ORAL | Status: DC | PRN

## 2016-05-11 MED ORDER — VALPROIC ACID 250 MG PO CAPS
500.0000 mg | ORAL_CAPSULE | Freq: Two times a day (BID) | ORAL | Status: DC
  Administered 2016-05-11 – 2016-05-15 (×8): 500 mg via ORAL
  Filled 2016-05-11 (×10): qty 2

## 2016-05-11 MED ORDER — ATORVASTATIN CALCIUM 20 MG PO TABS
80.0000 mg | ORAL_TABLET | Freq: Every day | ORAL | Status: DC
  Administered 2016-05-12 – 2016-05-15 (×4): 80 mg via ORAL
  Filled 2016-05-11 (×4): qty 4
  Filled 2016-05-11: qty 1

## 2016-05-11 MED ORDER — SODIUM CHLORIDE 0.9 % IV SOLN: INTRAVENOUS | Status: DC

## 2016-05-11 MED ORDER — PIPERACILLIN-TAZOBACTAM 3.375 G IVPB
3.3750 g | Freq: Three times a day (TID) | INTRAVENOUS | Status: DC
  Administered 2016-05-11 – 2016-05-15 (×10): 3.375 g via INTRAVENOUS
  Filled 2016-05-11 (×13): qty 50

## 2016-05-11 MED ORDER — HYDROCODONE-ACETAMINOPHEN 5-325 MG PO TABS
1.0000 | ORAL_TABLET | ORAL | Status: DC | PRN
  Administered 2016-05-11 – 2016-05-12 (×4): 2 via ORAL
  Administered 2016-05-13: 1 via ORAL
  Filled 2016-05-11: qty 2
  Filled 2016-05-11: qty 1
  Filled 2016-05-11 (×4): qty 2

## 2016-05-11 MED ORDER — INSULIN ASPART 100 UNIT/ML ~~LOC~~ SOLN
4.0000 [IU] | Freq: Three times a day (TID) | SUBCUTANEOUS | Status: DC
  Administered 2016-05-12 – 2016-05-14 (×6): 4 [IU] via SUBCUTANEOUS
  Filled 2016-05-11 (×4): qty 4

## 2016-05-11 MED ORDER — ACETAMINOPHEN 650 MG RE SUPP
650.0000 mg | Freq: Four times a day (QID) | RECTAL | Status: DC | PRN
Start: 2016-05-11 — End: 2016-05-15

## 2016-05-11 MED ORDER — LABETALOL HCL 5 MG/ML IV SOLN
10.0000 mg | INTRAVENOUS | Status: DC | PRN
  Filled 2016-05-11: qty 4

## 2016-05-11 MED ORDER — CYCLOBENZAPRINE HCL 10 MG PO TABS: 5.0000 mg | ORAL_TABLET | Freq: Three times a day (TID) | ORAL | Status: DC | PRN

## 2016-05-11 NOTE — Progress Notes (Signed)
Pt. C/o pain after receiving hydrocodone. Dr. Allena KatzPatel ordered 1-2 tabs of hydrocodone q4h prn

## 2016-05-11 NOTE — H&P (Signed)
[facility] - [place] at [facility]   PATIENT NAME: [name]    MR#:  [id number]  DATE OF BIRTH:  07-Feb-1966  DATE OF ADMISSION:  05/11/2016  PRIMARY CARE PHYSICIAN: [name], MD   REQUESTING/REFERRING PHYSICIAN:   CHIEF COMPLAINT:  Right foot pain, swelling   HISTORY OF PRESENT ILLNESS: [name]  is a 50 y.o. female with a known history of diabetes, essential hypertension, hyperlipidemia, stroke, legally blind status, who presents to the hospitalist from Dr. Dory Larsen office with right foot swelling. Apparently the patient stepped on a toothpick about a week ago, since a week ago she is been noticing redness swelling significant pain in the right foot. She was given antibiotic therapy in the emergency room on 05/02/2016, Keflex and the Septra DS, with no significant improvement of pain and swelling. She was seen by Dr. Alberteen Spindle who recommended to admit patient to the hospital for an MRI and IV antibiotics. Patient admits of having less of pain in the right foot, feeling presyncopal and visit, admits of increased frequency of urination, at least 3 times or more night, some constipation, denies any fevers or chills. Labs are pending.   PAST MEDICAL HISTORY:   Past Medical History:  Diagnosis Date  . Allergic rhinitis   . Chickenpox   . Depression   . Diabetes (HCC)   . Epilepsy (HCC)   . Headache   . HTN (hypertension)   . Hyperlipidemia   . Seizures (HCC)   . Stroke (HCC)   . Urinary incontinence     PAST SURGICAL HISTORY:  Past Surgical History:  Procedure Laterality Date  . CESAREAN SECTION      SOCIAL HISTORY:  Social History  Substance Use Topics  . Smoking status: Never Smoker  . Smokeless tobacco: Never Used  . Alcohol use No    FAMILY HISTORY:  Family History  Problem Relation Age of Onset  . Hypertension Mother   . Diabetes Mellitus II Mother   . Hypertension Father   . Pancreatic cancer Father     DRUG ALLERGIES: No Known  Allergies  Review of Systems  Constitutional: Negative for chills, fever and weight loss.  HENT: Negative for congestion.   Eyes: Positive for blurred vision. Negative for double vision.  Respiratory: Negative for cough, sputum production, shortness of breath and wheezing.   Cardiovascular: Negative for chest pain, palpitations, orthopnea, leg swelling and PND.  Gastrointestinal: Negative for abdominal pain, blood in stool, constipation, diarrhea, nausea and vomiting.  Genitourinary: Negative for dysuria, frequency, hematuria and urgency.  Musculoskeletal: Positive for joint pain. Negative for falls.  Neurological: Positive for dizziness. Negative for tremors, focal weakness and headaches.  Endo/Heme/Allergies: Does not bruise/bleed easily.  Psychiatric/Behavioral: Negative for depression. The patient does not have insomnia.     MEDICATIONS AT HOME:  Prior to Admission medications   Medication Sig Start Date End Date Taking? Authorizing Provider  albuterol (PROVENTIL HFA;VENTOLIN HFA) 108 (90 BASE) MCG/ACT inhaler Inhale 2 puffs into the lungs every 6 (six) hours as needed for wheezing or shortness of breath. 08/14/15   Jene Every, MD  aspirin EC 325 MG EC tablet Take 1 tablet (325 mg total) by mouth daily. 03/19/14   Layne Benton, NP  atorvastatin (LIPITOR) 80 MG tablet Take 1 tablet (80 mg total) by mouth daily. 11/28/15   Enid Baas, MD  cephALEXin (KEFLEX) 500 MG capsule Take 1 capsule (500 mg total) by mouth 4 (four) times daily. 05/02/16   Rhonda L  Summers, PA-C  cyclobenzaprine (FLEXERIL) 5 MG tablet Take 1 tablet (5 mg total) by mouth 3 (three) times daily as needed for muscle spasms. 11/28/15   Enid Baas, MD  HYDROcodone-acetaminophen (NORCO/VICODIN) 5-325 MG tablet Take 1 tablet by mouth every 4 (four) hours as needed for moderate pain. 05/02/16   Tommi Rumps, PA-C  insulin detemir (LEVEMIR) 100 UNIT/ML injection Inject 0.22 mLs (22 Units total) into the skin at  bedtime. 05/03/16   Glori Luis, MD  lisinopril-hydrochlorothiazide (PRINZIDE,ZESTORETIC) 20-12.5 MG per tablet Take 1 tablet by mouth daily. 04/19/15   Jene Every, MD  metFORMIN (GLUCOPHAGE) 1000 MG tablet Take 500 mg by mouth 2 (two) times daily with a meal.     Historical Provider, MD  sulfamethoxazole-trimethoprim (BACTRIM DS,SEPTRA DS) 800-160 MG tablet Take 1 tablet by mouth 2 (two) times daily. 05/02/16   Tommi Rumps, PA-C  valproic acid (DEPAKENE) 250 MG capsule Take 2 capsules (500 mg total) by mouth 2 (two) times daily. Patient taking differently: Take 250 mg by mouth 2 (two) times daily.  06/15/15   Srikar Sudini, MD      PHYSICAL EXAMINATION:   VITAL SIGNS: Blood pressure (!) 170/102, pulse 88, temperature 97.7 F (36.5 C), temperature source Oral, resp. rate 18, last menstrual period 04/21/2016, SpO2 98 %.  GENERAL:  50 y.o.-year-old Obese African-American patient sitting in the bed in mild to moderate distress due to right foot pain, inability to move .  EYES: Pupils equal, round, reactive to light and accommodation. No scleral icterus. Extraocular muscles intact.  HEENT: Head atraumatic, normocephalic. Oropharynx and nasopharynx clear.  NECK:  Supple, no jugular venous distention. No thyroid enlargement, no tenderness.  LUNGS: Normal breath sounds bilaterally, no wheezing, rales,rhonchi or crepitation. No use of accessory muscles of respiration.  CARDIOVASCULAR: S1, S2 normal. 3/6 systolic murmur in aortic auscultation side with some radiation into the bilateral neck, no rubs, or gallops.  ABDOMEN: Soft, nontender, nondistended. Bowel sounds present. No organomegaly or mass.  EXTREMITIES: No pedal edema, cyanosis, or clubbing. Right foot is swollen distal area, concentration and bruising significant tenderness was noted at the bottom of the right fifth toe increased warmth, no drainage NEUROLOGIC: Cranial nerves II through XII are intact. Muscle strength 5/5 in all  extremities. Sensation intact. Gait not checked.  PSYCHIATRIC: The patient is alert and oriented x 3.  SKIN: No obvious rash, lesion, or ulcer.   LABORATORY PANEL:   CBC No results for input(s): WBC, HGB, HCT, PLT, MCV, MCH, MCHC, RDW, LYMPHSABS, MONOABS, EOSABS, BASOSABS, BANDABS in the last 168 hours.  Invalid input(s): NEUTRABS, BANDSABD ------------------------------------------------------------------------------------------------------------------  Chemistries  No results for input(s): NA, K, CL, CO2, GLUCOSE, BUN, CREATININE, CALCIUM, MG, AST, ALT, ALKPHOS, BILITOT in the last 168 hours.  Invalid input(s): GFRCGP ------------------------------------------------------------------------------------------------------------------  Cardiac Enzymes No results for input(s): TROPONINI in the last 168 hours. ------------------------------------------------------------------------------------------------------------------  RADIOLOGY: No results found.  EKG: Orders placed or performed during the hospital encounter of 11/27/15  . ED EKG  . ED EKG    IMPRESSION AND PLAN:  Active Problems:   Right foot infection   Foot abscess, right #1 right foot abscess, in addition medical floor, get the blood causes, initiated her on broad-spectrum antibiotic therapy of his vancomycin and Zosyn, MRI is going to be performed tomorrow, but doesn't consultation is requested procedure #2 diabetes mellitus type 2, poorly controlled blood glucose levels ranging between 200s to 300s, per patient's admission, continue patient on insulin Lantus/Levemir, get hemoglobin A1c, advanced medications  as needed, continue sliding scale short-acting insulin, diabetic education was provided at the bedside #3 malignant essential hypertension, continue Zestoretic, advance as needed, add labetalol intravenously and nitroglycerin topically, #4 morbid obesity, get TSH, hemoglobin A1c, continue patient on Lipitor   All  the records are reviewed and case discussed with ED provider. Management plans discussed with the patient, family and they are in agreement.  CODE STATUS:    Code Status Orders        Start     Ordered   05/11/16 1730  Full code  Continuous     05/11/16 1729    Code Status History    Date Active Date Inactive Code Status Order ID Comments User Context   11/27/2015 12:35 PM 11/28/2015  4:26 PM Full Code [id number]  Houston SirenVivek J Sainani, MD ED   06/14/2015  6:20 AM 06/15/2015 10:14 PM Full Code [id number]  Arnaldo NatalMichael S Diamond, MD Inpatient   05/19/2015  1:11 AM 05/19/2015  8:41 PM Full Code [id number]  Crissie FiguresEdavally N Reddy, MD Inpatient       TOTAL TIME TAKING CARE OF THIS PATIENT: 55 minutes.    Katharina CaperVAICKUTE,Kazaria Gaertner M.D on 05/11/2016 at 6:13 PM  Between 7am to 6pm - Pager - [phone] After 6pm go to [email or web address] - password [email or web address] [facility]  [facility]  Office  [phone]  CC: Primary care physician; Marikay AlarEric Sonnenberg, MD

## 2016-05-11 NOTE — Progress Notes (Signed)
Pharmacy Antibiotic Note  [name] is a 50 y.o. female admitted on 05/11/2016 with wound infection with abcess.  Pharmacy has been consulted for Vancomycin/Zosyn dosing.  Plan: Vancomycin 1250 mg IV every 12 hours.  Goal trough 15-20 mcg/mL. Zosyn 3.375g IV q8h (4 hour infusion).  Will initiated Vancomycin 1000mg  IV x 1 then Vancomycin 1250mg  after 1st dose per stacked dose protocol. Will order Vancomycin trough level prior to the fifth.  Adjusted body weight= 71.78kg  Ke=0.084 T1/2=8.25 Vd=50.246 Estimated trough=17   Temp (24hrs), Avg:97.7 F (36.5 C), Min:97.7 F (36.5 C), Max:97.7 F (36.5 C)   Recent Labs Lab 05/11/16 1750  WBC 7.9  CREATININE 0.77    Estimated Creatinine Clearance: 95.4 mL/min (by C-G formula based on SCr of 0.8 mg/dL).    No Known Allergies  Antimicrobials this admission: Vancomycin 8/25 >>  Zosyn 8/25 >>   Microbiology results: 8/25 BCx: in process   Thank you for allowing pharmacy to be a part of this patient's care.  Delsa BernKelly m Fuhrmann 05/11/2016 6:48 PM

## 2016-05-11 NOTE — Progress Notes (Signed)
PHARMACIST - PHYSICIAN COMMUNICATION  CONCERNING:  Enoxaparin (Lovenox) for DVT Prophylaxis    RECOMMENDATION: Patient was prescribed enoxaprin 40mg  q24 hours for VTE prophylaxis.   BMI: 41  Estimated Creatinine Clearance: 95.4 mL/min (by C-G formula based on SCr of 0.8 mg/dL).   [facility] policy patient is candidate for enoxaparin 40mg  every 12 hour dosing due to BMI >40.   DESCRIPTION: Pharmacy has adjusted enoxaparin dose per [facility] policy.  Patient is now receiving enoxaparin 40mg  every 12 hours.    Cher NakaiSheema Alda Gaultney, PharmD Clinical Pharmacist  05/11/2016 8:17 PM

## 2016-05-12 ENCOUNTER — Inpatient Hospital Stay: Payer: BLUE CROSS/BLUE SHIELD

## 2016-05-12 LAB — GLUCOSE, CAPILLARY
GLUCOSE-CAPILLARY: 154 mg/dL — AB (ref 65–99)
GLUCOSE-CAPILLARY: 224 mg/dL — AB (ref 65–99)
Glucose-Capillary: 191 mg/dL — ABNORMAL HIGH (ref 65–99)
Glucose-Capillary: 175 mg/dL — ABNORMAL HIGH (ref 65–99)
Glucose-Capillary: 145 mg/dL — ABNORMAL HIGH (ref 65–99)

## 2016-05-12 LAB — BASIC METABOLIC PANEL
ANION GAP: 7 (ref 5–15)
BUN: 20 mg/dL (ref 6–20)
CHLORIDE: 102 mmol/L (ref 101–111)
CO2: 27 mmol/L (ref 22–32)
Calcium: 8.8 mg/dL — ABNORMAL LOW (ref 8.9–10.3)
Creatinine, Ser: 1.24 mg/dL — ABNORMAL HIGH (ref 0.44–1.00)
GFR calc non Af Amer: 50 mL/min — ABNORMAL LOW (ref >60)
GFR, EST AFRICAN AMERICAN: 58 mL/min — AB (ref >60)
Glucose, Bld: 219 mg/dL — ABNORMAL HIGH (ref 65–99)
POTASSIUM: 3.8 mmol/L (ref 3.5–5.1)
SODIUM: 136 mmol/L (ref 135–145)

## 2016-05-12 LAB — CBC
HEMATOCRIT: 34.1 % — AB (ref 35.0–47.0)
HEMOGLOBIN: 10.9 g/dL — AB (ref 12.0–16.0)
MCH: 20.1 pg — ABNORMAL LOW (ref 26.0–34.0)
MCHC: 32.1 g/dL (ref 32.0–36.0)
MCV: 62.7 fL — AB (ref 80.0–100.0)
PLATELETS: 167 10*3/uL (ref 150–440)
RBC: 5.44 MIL/uL — AB (ref 3.80–5.20)
RDW: 18.2 % — ABNORMAL HIGH (ref 11.5–14.5)
WBC: 6.3 10*3/uL (ref 3.6–11.0)

## 2016-05-12 LAB — HEMOGLOBIN A1C: HEMOGLOBIN A1C: 13.6 % — AB (ref 4.0–6.0)

## 2016-05-12 LAB — SURGICAL PCR SCREEN
MRSA, PCR: NEGATIVE
Staphylococcus aureus: NEGATIVE

## 2016-05-12 MED ORDER — CHLORHEXIDINE GLUCONATE 4 % EX LIQD
60.0000 mL | Freq: Once | CUTANEOUS | Status: AC
  Administered 2016-05-13: 4 via TOPICAL

## 2016-05-12 MED ORDER — CEFAZOLIN SODIUM-DEXTROSE 2-4 GM/100ML-% IV SOLN
2.0000 g | INTRAVENOUS | Status: AC
  Administered 2016-05-13: 2 g via INTRAVENOUS
  Filled 2016-05-12: qty 100

## 2016-05-12 MED ORDER — INSULIN ASPART 100 UNIT/ML ~~LOC~~ SOLN
0.0000 [IU] | Freq: Three times a day (TID) | SUBCUTANEOUS | Status: DC
  Administered 2016-05-12: 4 [IU] via SUBCUTANEOUS
  Administered 2016-05-12: 3 [IU] via SUBCUTANEOUS
  Administered 2016-05-12: 4 [IU] via SUBCUTANEOUS
  Administered 2016-05-13: 7 [IU] via SUBCUTANEOUS
  Administered 2016-05-13: 11 [IU] via SUBCUTANEOUS
  Administered 2016-05-14: 7 [IU] via SUBCUTANEOUS
  Administered 2016-05-14: 11 [IU] via SUBCUTANEOUS
  Administered 2016-05-14: 3 [IU] via SUBCUTANEOUS
  Administered 2016-05-15: 7 [IU] via SUBCUTANEOUS
  Filled 2016-05-12 (×2): qty 3
  Filled 2016-05-12: qty 7
  Filled 2016-05-12: qty 4
  Filled 2016-05-12: qty 7
  Filled 2016-05-12: qty 11
  Filled 2016-05-12: qty 4
  Filled 2016-05-12: qty 7
  Filled 2016-05-12: qty 11

## 2016-05-12 MED ORDER — VANCOMYCIN HCL IN DEXTROSE 1-5 GM/200ML-% IV SOLN
1000.0000 mg | INTRAVENOUS | Status: DC
Start: 2016-05-12 — End: 2016-05-13
  Administered 2016-05-12: 1000 mg via INTRAVENOUS
  Filled 2016-05-12 (×2): qty 200

## 2016-05-12 NOTE — Consult Note (Signed)
ORTHOPAEDIC CONSULTATION  REQUESTING PHYSICIAN: Wyatt Hasteavid K Hower, MD  Chief Complaint: Right foot pain. Approximately 1 week ago she states she stepped on a toothpick. She thought she had removed it. Was placed on antibiotics and seen in the outpatient clinic. He was seen yesterday he noticed worsening swelling and some redness around her fifth toe joint. She is diabetic. She was admitted for MRI and further evaluation as well as antibiotics. She complains of worsening pain around her fifth toe with any pressure. An MRI has been ordered and it was attempted but the patient complained the MRI was too painful to perform. Denies any fever or chills.   Past Medical History:  Diagnosis Date  . Allergic rhinitis   . Chickenpox   . Depression   . Diabetes (HCC)   . Epilepsy (HCC)   . Headache   . HTN (hypertension)   . Hyperlipidemia   . Seizures (HCC)   . Stroke (HCC)   . Urinary incontinence    Past Surgical History:  Procedure Laterality Date  . CESAREAN SECTION     Social History   Social History  . Marital status: Married    Spouse name: N/A  . Number of children: N/A  . Years of education: N/A   Social History Main Topics  . Smoking status: Never Smoker  . Smokeless tobacco: Never Used  . Alcohol use No  . Drug use: No  . Sexual activity: Not Asked   Other Topics Concern  . None   Social History Narrative  . None   Family History  Problem Relation Age of Onset  . Hypertension Mother   . Diabetes Mellitus II Mother   . Hypertension Father   . Pancreatic cancer Father    No Known Allergies Prior to Admission medications   Medication Sig Start Date End Date Taking? Authorizing Provider  albuterol (PROVENTIL HFA;VENTOLIN HFA) 108 (90 BASE) MCG/ACT inhaler Inhale 2 puffs into the lungs every 6 (six) hours as needed for wheezing or shortness of breath. 08/14/15   Jene Everyobert Kinner, MD  aspirin EC 325 MG EC tablet Take 1 tablet (325 mg total) by mouth daily. 03/19/14    Layne BentonSharon L Biby, NP  atorvastatin (LIPITOR) 80 MG tablet Take 1 tablet (80 mg total) by mouth daily. 11/28/15   Enid Baasadhika Kalisetti, MD  cephALEXin (KEFLEX) 500 MG capsule Take 1 capsule (500 mg total) by mouth 4 (four) times daily. 05/02/16   Tommi Rumpshonda L Summers, PA-C  cyclobenzaprine (FLEXERIL) 5 MG tablet Take 1 tablet (5 mg total) by mouth 3 (three) times daily as needed for muscle spasms. 11/28/15   Enid Baasadhika Kalisetti, MD  HYDROcodone-acetaminophen (NORCO/VICODIN) 5-325 MG tablet Take 1 tablet by mouth every 4 (four) hours as needed for moderate pain. 05/02/16   Tommi Rumpshonda L Summers, PA-C  insulin detemir (LEVEMIR) 100 UNIT/ML injection Inject 0.22 mLs (22 Units total) into the skin at bedtime. 05/03/16   Glori LuisEric G Sonnenberg, MD  lisinopril-hydrochlorothiazide (PRINZIDE,ZESTORETIC) 20-12.5 MG per tablet Take 1 tablet by mouth daily. 04/19/15   Jene Everyobert Kinner, MD  metFORMIN (GLUCOPHAGE) 1000 MG tablet Take 500 mg by mouth 2 (two) times daily with a meal.     Historical Provider, MD  sulfamethoxazole-trimethoprim (BACTRIM DS,SEPTRA DS) 800-160 MG tablet Take 1 tablet by mouth 2 (two) times daily. 05/02/16   Tommi Rumpshonda L Summers, PA-C  valproic acid (DEPAKENE) 250 MG capsule Take 2 capsules (500 mg total) by mouth 2 (two) times daily. Patient taking differently: Take 250 mg by mouth 2 (two)  times daily.  06/15/15   Milagros Loll, MD   No results found.  Positive ROS: All other systems have been reviewed and were otherwise negative with the exception of those mentioned in the HPI and as above.  12 point ROS was performed.  Physical Exam: General: Alert and oriented.  No apparent distress.  Vascular:  Left foot:Dorsalis Pedis:  present Posterior Tibial:  present  Right foot: Dorsalis Pedis:  present Posterior Tibial:  present  Neuro:intact sensation bilaterally.  Derm: Left foot without issue. Right fifth MTPJ has a focal edematous area with mild fluctuance. It is extremely tender with palpation as well as range of  motion of the fifth toe joint. No open draining sites. Areas she describes as the puncture site looks to be covered but on the fifth toe joint quite painful.  Ortho/MS: Focal edema around the right fifth MTPJ consistent with a fluctuant region. Most consistent with likely abscess given her history. No lymphangitic streaking. No pain to the ankle or midfoot region.   Assessment: Likely abscess right fifth MTPJ.  Plan: I discussed with the patient it would've been ideal to obtain the MRI. She states she is not able to obtain this secondary to pain from the"camera"on her right foot. Clinically this does appear to be an abscess to the area. At this point I recommended open I&D of the area as well as obtaining a culture intraoperatively to assist [place] with antibiotics. We'll plan to do this tomorrow morning. We'll make her nothing by mouth after midnight tonight.    Irean Hong, DPM Cell [phone]   05/12/2016 3:24 PM

## 2016-05-12 NOTE — Progress Notes (Signed)
Pharmacy Antibiotic Note  [name] is a 50 y.o. female admitted on 05/11/2016 with wound infection with abcess.  Pharmacy has been consulted for Vancomycin/Zosyn dosing.  Plan: Vancomycin 1250 mg IV every 12 hours.  Goal trough 15-20 mcg/mL. Zosyn 3.375g IV q8h (4 hour infusion).  Will initiated Vancomycin 1000mg  IV x 1 then Vancomycin 1250mg  after 1st dose per stacked dose protocol. Will order Vancomycin trough level prior to the fifth.  8/26:  Scr increased 0.77 > 1.24. Will adjust Vancomycin to 1 gram IV q18h. F/u Scr in am.    Ke 0.040, t1/2 17.33  Vd 50.26 Vancomycin trough changed to 8/228 at 0730.    Adjusted body weight= 71.78kg Height: 5\' 2"  (157.5 cm) Weight: 229 lb 15 oz (104.3 kg) IBW/kg (Calculated) : 50.1Ke=0.084 T1/2=8.25 Vd=50.246 Estimated trough=17   Temp (24hrs), Avg:98 F (36.7 C), Min:97.6 F (36.4 C), Max:98.5 F (36.9 C)   Recent Labs Lab 05/11/16 1750 05/12/16 0412  WBC 7.9 6.3  CREATININE 0.77 1.24*    Estimated Creatinine Clearance: 61.5 mL/min (by C-G formula based on SCr of 1.24 mg/dL).    No Known Allergies  Antimicrobials this admission: Vancomycin 8/25 >>  Zosyn 8/25 >>   Microbiology results: 8/25 BCx: in process   Thank you for allowing pharmacy to be a part of this patient's care.  Nicklas Mcsweeney A 05/12/2016 10:48 AM

## 2016-05-12 NOTE — Progress Notes (Signed)
[facility] - [place] at [facility]   PATIENT NAME: [name]    MRN#:  [id number]  DATE OF BIRTH:  09/23/1965  SUBJECTIVE:  Hospital Day: 1 day [name] is a 50 y.o. female presenting with Foot pain.   Overnight events: No overnight events Interval Events: Still some complaints about a right-sided foot pain denies fevers chills further symptoms  REVIEW OF SYSTEMS:  CONSTITUTIONAL: No fever, fatigue or weakness.  EYES: No blurred or double vision.  EARS, NOSE, AND THROAT: No tinnitus or ear pain.  RESPIRATORY: No cough, shortness of breath, wheezing or hemoptysis.  CARDIOVASCULAR: No chest pain, orthopnea, edema.  GASTROINTESTINAL: No nausea, vomiting, diarrhea or abdominal pain.  GENITOURINARY: No dysuria, hematuria.  ENDOCRINE: No polyuria, nocturia,  HEMATOLOGY: No anemia, easy bruising or bleeding SKIN: No rash or lesion. MUSCULOSKELETAL: No joint pain or arthritis.   NEUROLOGIC: No tingling, numbness, weakness.  PSYCHIATRY: No anxiety or depression.   DRUG ALLERGIES:  No Known Allergies  VITALS:  Blood pressure 112/68, pulse 84, temperature 98.2 F (36.8 C), temperature source Oral, resp. rate 18, height 5\' 2"  (1.575 m), weight 104.3 kg (229 lb 15 oz), last menstrual period 04/21/2016, SpO2 99 %.  PHYSICAL EXAMINATION:  VITAL SIGNS: Vitals:   05/12/16 0501 05/12/16 0755  BP: 110/62 112/68  Pulse:  84  Resp:  18  Temp:  98.2 F (36.8 C)   GENERAL:49 y.o.female currently in no acute distress.  HEAD: Normocephalic, atraumatic.  EYES: Pupils equal, round, reactive to light. Extraocular muscles intact. No scleral icterus.  MOUTH: Moist mucosal membrane. Dentition intact. No abscess noted.  EAR, NOSE, THROAT: Clear without exudates. No external lesions.  NECK: Supple. No thyromegaly. No nodules. No JVD.  PULMONARY: Clear to ascultation, without wheeze rails or rhonci. No use of accessory muscles, Good respiratory effort. good air entry  bilaterally CHEST: Nontender to palpation.  CARDIOVASCULAR: S1 and S2. Regular rate and rhythm. No murmurs, rubs, or gallops. No edema. Pedal pulses 2+ bilaterally.  GASTROINTESTINAL: Soft, nontender, nondistended. No masses. Positive bowel sounds. No hepatosplenomegaly.  MUSCULOSKELETAL: No swelling, clubbing, or edema. Range of motion full in all extremities.  NEUROLOGIC: Cranial nerves II through XII are intact. No gross focal neurological deficits. Sensation intact. Reflexes intact.  SKIN: Small area of erythema lateral aspect right foot No ulceration, lesions, rashes, or cyanosis. Skin warm and dry. Turgor intact.  PSYCHIATRIC: Mood, affect within normal limits. The patient is awake, alert and oriented x 3. Insight, judgment intact.      LABORATORY PANEL:   CBC  Recent Labs Lab 05/12/16 0412  WBC 6.3  HGB 10.9*  HCT 34.1*  PLT 167   ------------------------------------------------------------------------------------------------------------------  Chemistries   Recent Labs Lab 05/11/16 1750 05/12/16 0412  NA 134* 136  K 3.8 3.8  CL 101 102  CO2 26 27  GLUCOSE 231* 219*  BUN 12 20  CREATININE 0.77 1.24*  CALCIUM 8.8* 8.8*  AST 17  --   ALT 16  --   ALKPHOS 62  --   BILITOT 0.3  --    ------------------------------------------------------------------------------------------------------------------  Cardiac Enzymes No results for input(s): TROPONINI in the last 168 hours. ------------------------------------------------------------------------------------------------------------------  RADIOLOGY:  No results found.  EKG:   Orders placed or performed during the hospital encounter of 11/27/15  . ED EKG  . ED EKG    ASSESSMENT AND PLAN:   [name] is a 50 y.o. female presenting with No chief complaint on file. . Admitted 05/11/2016 : Day #:  1 day 1. Diabetic foot ulcer: On broad antibiotics MRI pending for today podiatry following, superficially  infection does not look that impressive will follow results of MRI prior to making antibiotic adjustments  2. Type 2 diabetes insulin requiring: Hyperglycemia, increase insulin sliding coverage, continue to monitor 3. Essential hypertension continue lisinopril   All the records are reviewed and case discussed with Care Management/Social Workerr. Management plans discussed with the patient, family and they are in agreement.  CODE STATUS: full TOTAL TIME TAKING CARE OF THIS PATIENT: 28 minutes.   POSSIBLE D/C IN 1-2DAYS, DEPENDING ON CLINICAL CONDITION.   Hower,  [name].D on 05/12/2016 at 11:08 AM  Between 7am to 6pm - Pager - [phone]  After 6pm: House Pager: - [phone]  [facility]  Office  [phone]  CC: Primary care physician; [name], MD

## 2016-05-13 ENCOUNTER — Encounter
Admission: AD | Disposition: A | Discharge status: Home / Self Care | Payer: Self-pay | Source: Ambulatory Visit | Attending: Internal Medicine

## 2016-05-13 ENCOUNTER — Inpatient Hospital Stay: Payer: BLUE CROSS/BLUE SHIELD | Admitting: Anesthesiology

## 2016-05-13 ENCOUNTER — Encounter: Payer: Self-pay | Admitting: Anesthesiology

## 2016-05-13 HISTORY — PX: INCISION AND DRAINAGE: SHX5863

## 2016-05-13 LAB — GLUCOSE, CAPILLARY
GLUCOSE-CAPILLARY: 214 mg/dL — AB (ref 65–99)
GLUCOSE-CAPILLARY: 194 mg/dL — AB (ref 65–99)
Glucose-Capillary: 207 mg/dL — ABNORMAL HIGH (ref 65–99)
Glucose-Capillary: 262 mg/dL — ABNORMAL HIGH (ref 65–99)

## 2016-05-13 LAB — CREATININE, SERUM
Creatinine, Ser: 0.96 mg/dL (ref 0.44–1.00)
GFR calc Af Amer: >60 mL/min (ref >60)
GFR calc non Af Amer: >60 mL/min (ref >60)

## 2016-05-13 SURGERY — INCISION AND DRAINAGE
Anesthesia: General | Laterality: Right

## 2016-05-13 MED ORDER — FENTANYL CITRATE (PF) 100 MCG/2ML IJ SOLN: 25.0000 ug | INTRAMUSCULAR | Status: DC | PRN

## 2016-05-13 MED ORDER — OXYCODONE-ACETAMINOPHEN 5-325 MG PO TABS
1.0000 | ORAL_TABLET | ORAL | Status: DC | PRN
  Administered 2016-05-13 – 2016-05-15 (×5): 2 via ORAL
  Filled 2016-05-13 (×5): qty 2

## 2016-05-13 MED ORDER — LIDOCAINE HCL 1 % IJ SOLN
INTRAMUSCULAR | Status: DC | PRN
  Administered 2016-05-13: 5 mL

## 2016-05-13 MED ORDER — MIDAZOLAM HCL 2 MG/2ML IJ SOLN
INTRAMUSCULAR | Status: DC | PRN
  Administered 2016-05-13: 2 mg via INTRAVENOUS

## 2016-05-13 MED ORDER — BUPIVACAINE HCL (PF) 0.5 % IJ SOLN
INTRAMUSCULAR | Status: AC
  Filled 2016-05-13: qty 30

## 2016-05-13 MED ORDER — SODIUM CHLORIDE 0.9 % IV SOLN
1250.0000 mg | INTRAVENOUS | Status: DC
  Administered 2016-05-13 – 2016-05-14 (×2): 1250 mg via INTRAVENOUS
  Filled 2016-05-13 (×3): qty 1250

## 2016-05-13 MED ORDER — PROPOFOL 500 MG/50ML IV EMUL
INTRAVENOUS | Status: DC | PRN
  Administered 2016-05-13: 120 ug/kg/min via INTRAVENOUS

## 2016-05-13 MED ORDER — BUPIVACAINE HCL (PF) 0.25 % IJ SOLN
INTRAMUSCULAR | Status: AC
  Filled 2016-05-13: qty 30

## 2016-05-13 MED ORDER — PROPOFOL 10 MG/ML IV BOLUS
INTRAVENOUS | Status: DC | PRN
  Administered 2016-05-13: 50 mg via INTRAVENOUS
  Administered 2016-05-13: 40 mg via INTRAVENOUS

## 2016-05-13 MED ORDER — FENTANYL CITRATE (PF) 100 MCG/2ML IJ SOLN
INTRAMUSCULAR | Status: DC | PRN
  Administered 2016-05-13: 100 ug via INTRAVENOUS

## 2016-05-13 MED ORDER — LACTATED RINGERS IV SOLN
INTRAVENOUS | Status: DC | PRN
  Administered 2016-05-13: 08:00:00 via INTRAVENOUS

## 2016-05-13 MED ORDER — ONDANSETRON HCL 4 MG PO TABS: 4.0000 mg | ORAL_TABLET | Freq: Four times a day (QID) | ORAL | Status: DC | PRN

## 2016-05-13 MED ORDER — LIDOCAINE HCL (PF) 1 % IJ SOLN
INTRAMUSCULAR | Status: AC
  Filled 2016-05-13: qty 30

## 2016-05-13 MED ORDER — ONDANSETRON HCL 4 MG/2ML IJ SOLN: 4.0000 mg | Freq: Four times a day (QID) | INTRAMUSCULAR | Status: DC | PRN

## 2016-05-13 MED ORDER — MORPHINE SULFATE (PF) 2 MG/ML IV SOLN
2.0000 mg | INTRAVENOUS | Status: DC | PRN
  Administered 2016-05-13 (×4): 2 mg via INTRAVENOUS
  Filled 2016-05-13 (×4): qty 1

## 2016-05-13 MED ORDER — LIDOCAINE-EPINEPHRINE 1 %-1:100000 IJ SOLN
INTRAMUSCULAR | Status: AC
Start: 2016-05-13 — End: 2016-05-13
  Filled 2016-05-13: qty 1

## 2016-05-13 MED ORDER — ONDANSETRON HCL 4 MG/2ML IJ SOLN: 4.0000 mg | Freq: Once | INTRAMUSCULAR | Status: DC | PRN

## 2016-05-13 MED ORDER — BUPIVACAINE HCL 0.5 % IJ SOLN
INTRAMUSCULAR | Status: DC | PRN
  Administered 2016-05-13: 6 mL

## 2016-05-13 SURGICAL SUPPLY — 63 items
BANDAGE ACE 4X5 VEL STRL LF (GAUZE/BANDAGES/DRESSINGS) ×2 IMPLANT
BANDAGE STRETCH 3X4.1 STRL (GAUZE/BANDAGES/DRESSINGS) ×2 IMPLANT
BLADE OSC/SAGITTAL MD 5.5X18 (BLADE) IMPLANT
BLADE OSCILLATING/SAGITTAL (BLADE)
BLADE SURG 15 STRL LF DISP TIS (BLADE) ×1 IMPLANT
BLADE SURG 15 STRL SS (BLADE) ×1
BLADE SW THK.38XMED LNG THN (BLADE) IMPLANT
BNDG COHESIVE 4X5 TAN STRL (GAUZE/BANDAGES/DRESSINGS) ×2 IMPLANT
BNDG COHESIVE 6X5 TAN STRL LF (GAUZE/BANDAGES/DRESSINGS) IMPLANT
BNDG ESMARK 4X12 TAN STRL LF (GAUZE/BANDAGES/DRESSINGS) ×2 IMPLANT
BNDG GAUZE 4.5X4.1 6PLY STRL (MISCELLANEOUS) ×2 IMPLANT
CANISTER SUCT 1200ML W/VALVE (MISCELLANEOUS) ×2 IMPLANT
CANISTER SUCT 3000ML (MISCELLANEOUS) IMPLANT
CUFF TOURN 18 STER (MISCELLANEOUS) IMPLANT
CUFF TOURN DUAL PL 12 NO SLV (MISCELLANEOUS) ×2 IMPLANT
DRAPE FLUOR MINI C-ARM 54X84 (DRAPES) IMPLANT
DRAPE XRAY CASSETTE 23X24 (DRAPES) IMPLANT
DRESSING ALLEVYN 4X4 (MISCELLANEOUS) IMPLANT
DURAPREP 26ML APPLICATOR (WOUND CARE) ×2 IMPLANT
ELECT REM PT RETURN 9FT ADLT (ELECTROSURGICAL) ×2
ELECTRODE REM PT RTRN 9FT ADLT (ELECTROSURGICAL) ×1 IMPLANT
GAUZE IODOFORM PACK 1/2 7832 (GAUZE/BANDAGES/DRESSINGS) ×2 IMPLANT
GAUZE PACKING 1/4X5YD (GAUZE/BANDAGES/DRESSINGS) IMPLANT
GAUZE PACKING IODOFORM 1X5 (MISCELLANEOUS) IMPLANT
GAUZE PETRO XEROFOAM 1X8 (MISCELLANEOUS) IMPLANT
GAUZE SPONGE 4X4 12PLY STRL (GAUZE/BANDAGES/DRESSINGS) ×2 IMPLANT
GAUZE STRETCH 2X75IN STRL (MISCELLANEOUS) IMPLANT
GLOVE BIO SURGEON STRL SZ7.5 (GLOVE) ×6 IMPLANT
GLOVE INDICATOR 8.0 STRL GRN (GLOVE) ×2 IMPLANT
GOWN STRL REUS W/ TWL LRG LVL3 (GOWN DISPOSABLE) ×2 IMPLANT
GOWN STRL REUS W/TWL LRG LVL3 (GOWN DISPOSABLE) ×2
GOWN STRL REUS W/TWL MED LVL3 (GOWN DISPOSABLE) ×2 IMPLANT
HANDPIECE INTERPULSE COAX TIP (DISPOSABLE)
HANDPIECE VERSAJET DEBRIDEMENT (MISCELLANEOUS) IMPLANT
IV NS 1000ML (IV SOLUTION)
IV NS 1000ML BAXH (IV SOLUTION) IMPLANT
KIT DRSG VAC SLVR GRANUFM (MISCELLANEOUS) IMPLANT
KIT RM TURNOVER STRD PROC AR (KITS) ×2 IMPLANT
LABEL OR SOLS (LABEL) ×2 IMPLANT
NEEDLE FILTER BLUNT 18X 1/2SAF (NEEDLE) ×1
NEEDLE FILTER BLUNT 18X1 1/2 (NEEDLE) ×1 IMPLANT
NEEDLE HYPO 25X1 1.5 SAFETY (NEEDLE) ×2 IMPLANT
NS IRRIG 500ML POUR BTL (IV SOLUTION) ×2 IMPLANT
PACK EXTREMITY ARMC (MISCELLANEOUS) ×2 IMPLANT
PAD ABD DERMACEA PRESS 5X9 (GAUZE/BANDAGES/DRESSINGS) ×2 IMPLANT
PENCIL ELECTRO HAND CTR (MISCELLANEOUS) IMPLANT
RASP SM TEAR CROSS CUT (RASP) IMPLANT
SET HNDPC FAN SPRY TIP SCT (DISPOSABLE) IMPLANT
SOL .9 NS 3000ML IRR  AL (IV SOLUTION)
SOL .9 NS 3000ML IRR UROMATIC (IV SOLUTION) IMPLANT
STOCKINETTE IMPERVIOUS 9X36 MD (GAUZE/BANDAGES/DRESSINGS) ×2 IMPLANT
SUT ETHILON 2 0 FS 18 (SUTURE) ×4 IMPLANT
SUT ETHILON 4-0 (SUTURE) ×1
SUT ETHILON 4-0 FS2 18XMFL BLK (SUTURE) ×1
SUT VIC AB 3-0 SH 27 (SUTURE) ×1
SUT VIC AB 3-0 SH 27X BRD (SUTURE) ×1 IMPLANT
SUT VIC AB 4-0 FS2 27 (SUTURE) ×2 IMPLANT
SUTURE ETHLN 4-0 FS2 18XMF BLK (SUTURE) ×1 IMPLANT
SWAB CULTURE AMIES ANAERIB BLU (MISCELLANEOUS) ×2 IMPLANT
SYR 3ML LL SCALE MARK (SYRINGE) ×2 IMPLANT
SYRINGE 10CC LL (SYRINGE) ×2 IMPLANT
TRAY PREP VAG/GEN (MISCELLANEOUS) IMPLANT
WND VAC CANISTER 500ML (MISCELLANEOUS) ×2 IMPLANT

## 2016-05-13 NOTE — OR Nursing (Signed)
Post op shoe in place 

## 2016-05-13 NOTE — Anesthesia Postprocedure Evaluation (Signed)
Anesthesia Post Note  Patient: [name]  Procedure(s) Performed: Procedure(s) (LRB): INCISION AND DRAINAGE (Right)  Patient location during evaluation: PACU Anesthesia Type: General Level of consciousness: awake and alert and oriented Pain management: pain level controlled Vital Signs Assessment: post-procedure vital signs reviewed and stable Respiratory status: spontaneous breathing Cardiovascular status: blood pressure returned to baseline Anesthetic complications: no    Last Vitals:  Vitals:   05/13/16 1344 05/13/16 1452  BP: 128/79 (!) 144/82  Pulse: (!) 101 96  Resp: 20 20  Temp: 36.8 C 36.7 C    Last Pain:  Vitals:   05/13/16 1452  TempSrc: Oral  PainSc:                  Evian Salguero

## 2016-05-13 NOTE — Op Note (Signed)
Operative note   Surgeon:Brynnlee Cumpian    Assistant:none    Preop diagnosis:right foot abscess     Postop diagnosis:Same    Procedure:I & D right 5th mtpj abscess    EBL: Minimal    Anesthesia:local and IV sedation    Hemostasis: None    Specimen: Wound culture abscess right foot    Complications: None    Operative indications:[name] is an 50 y.o. that presents today for surgical intervention.  The risks/benefits/alternatives/complications have been discussed and consent has been given.    Procedure:  Patient was brought into the OR and placed on the operating table in thesupine position. After anesthesia was obtained theright lower extremity was prepped and draped in usual sterile fashion.  Attention was directed to the lateral aspect of the fifth MTPJ where a longitudinal incision was made. Incision was taken through the epidermis into the dermal layer. Blunt dissection was undertaken at this point. At this time noted purulent drainage was found surrounding the fifth MTPJ lateral and plantar. Further blunt dissection was carried out around the fifth MTPJ lateral and plantar. The abscess region was flushed copiously with irrigation. Duration of the entire infected region did not reveal an obvious foreign body or signs of foreign body. The capsule of the fifth MTPJ was intact without exposure of the joint. No further areas of infection were noted. The wound was flushed with copious varus or irrigation and closure was performed to the skin with a 3-0 nylon. The distal aspect of the incision was packed open with iodoform packing. A large bulky sterile dressing was then applied.    Patient tolerated the procedure and anesthesia well.  Was transported from the OR to the PACU with all vital signs stable and vascular status intact. To be discharged per routine protocol.

## 2016-05-13 NOTE — Evaluation (Signed)
Physical Therapy Evaluation Patient Details Name: [name] MRN: [id number] DOB: 09-Jan-1966 Today's Date: 05/13/2016   History of Present Illness  50 y.o. female with a known history of diabetes, essential hypertension, hyperlipidemia, stroke, legally blind status, who presents to the hospitalist from Dr. Dory Larsen office with right foot swelling. Apparently the patient stepped on a toothpick about a week ago, since a week ago she is been noticing redness swelling significant pain in the right foot. She was given antibiotic therapy in the emergency room on 05/02/2016, Keflex and the Septra DS, with no significant improvement of pain and swelling.   Had I&D of R 5th toe.  Clinical Impression  Pt reports a lot of pain and initially did not want to work with PT secondary to this.  She does agree to exam and generally does well, though she did have some trouble fully maintaining NWBing t/o the entire bout of standing/walking/hopping.  She showed good bed mobility and strength but was fatigue with the minimal ambulation and was not able/willing to try the stairs today.  Pt may need follow up with HHPT and showed interest in wheel chair, both TBD per progress/safety.    Follow Up Recommendations Home health PT (per pt progress and surgical recs)    Equipment Recommendations   (possible w/c, could be beneficial secondary to NWBing)    Recommendations for Other Services       Precautions / Restrictions Precautions Precautions: Fall Required Braces or Orthoses:  (post-op boot on R when up) Restrictions Weight Bearing Restrictions: Yes RLE Weight Bearing: Non weight bearing      Mobility  Bed Mobility Overal bed mobility: Modified Independent             General bed mobility comments: Pt able to get LEs to EOB with slow, cautious effort  Transfers Overall transfer level: Modified independent Equipment used: Rolling walker (2 wheeled) Transfers: Sit to/from Stand Sit to Stand: Min  guard         General transfer comment: Pt needs cues for hand placement and to keep weight off R LE (she does put the boot down to get up, does not appear to place a lot of weight through it)  Ambulation/Gait Ambulation/Gait assistance: Min assist Ambulation Distance (Feet): 6 Feet Assistive device: Rolling walker (2 wheeled)       General Gait Details: Pt able to turn and take a few small forward hopping steps.  She is able to [facility] NWBing for some of the effort, but does appear to put R foot down more than once.  Stairs            Wheelchair Mobility    Modified Rankin (Stroke Patients Only)       Balance Overall balance assessment:  (Pt able to maintain balance with AD, sitting balance WNL)                                           Pertinent Vitals/Pain Pain Assessment: 0-10 Pain Score: 10-Worst pain ever Pain Location: Pt reports severe pain t/o the session, has increased shooting pain at times - despite not having consitant grimacing, etc she says pain stays 10/10...    Home Living Family/patient expects to be discharged to:: Private residence Living Arrangements: Spouse/significant other;Children Available Help at Discharge: Family   Home Access: Stairs to enter Entrance Stairs-Rails: Can reach both Entrance Stairs-Number of Steps:  4   Home Equipment: Walker - 2 wheels;Cane - single point      Prior Function Level of Independence: Independent with assistive device(s)         Comments: Pt uses [facility], does not drive but reports she is able to be out in the community w/o issue     Hand Dominance        Extremity/Trunk Assessment   Upper Extremity Assessment: Overall WFL for tasks assessed           Lower Extremity Assessment: Overall WFL for tasks assessed (except R LE minimal testing secondary to c/o pain)         Communication   Communication: No difficulties  Cognition Arousal/Alertness: Awake/alert Behavior  During Therapy: Anxious;WFL for tasks assessed/performed Overall Cognitive Status: Within Functional Limits for tasks assessed                      General Comments      Exercises        Assessment/Plan    PT Assessment Patient needs continued PT services  PT Diagnosis Difficulty walking;Generalized weakness   PT Problem List Decreased strength;Decreased activity tolerance;Decreased range of motion;Decreased balance;Decreased mobility;Decreased knowledge of use of DME;Decreased safety awareness;Pain;Decreased knowledge of precautions;Cardiopulmonary status limiting activity  PT Treatment Interventions DME instruction;Gait training;Stair training;Functional mobility training;Therapeutic activities;Therapeutic exercise;Balance training;Patient/family education   PT Goals (Current goals can be found in the Care Plan section) Acute Rehab PT Goals Patient Stated Goal: go home PT Goal Formulation: With patient Time For Goal Achievement: 05/26/16 Potential to Achieve Goals: Fair    Frequency Min 2X/week   Barriers to discharge        Co-evaluation               End of Session Equipment Utilized During Treatment: Gait belt Activity Tolerance: Patient limited by pain;Patient tolerated treatment well Patient left: with chair alarm set;with call bell/phone within reach           Time: [id number] PT Time Calculation (min) (ACUTE ONLY): 27 min   Charges:   PT Evaluation $PT Eval Low Complexity: 1 Procedure     PT G Codes:        Malachi ProGalen R Everlina Gotts , DPT 05/13/2016, 3:01 PM

## 2016-05-13 NOTE — Progress Notes (Signed)
[facility] [place] at [facility]   PATIENT NAME: [name]    MRN#:  [id number]  DATE OF BIRTH:  02/03/1966  SUBJECTIVE:  Hospital Day: 2 days [name] is a 50 y.o. female presenting with Foot pain.   Overnight events: Unable to successfully tolerate MRI yesterday Interval Events: Still some complaints about a right-sided foot pain denies fevers chills further symptoms  REVIEW OF SYSTEMS:  CONSTITUTIONAL: No fever, fatigue or weakness.  EYES: No blurred or double vision.  EARS, NOSE, AND THROAT: No tinnitus or ear pain.  RESPIRATORY: No cough, shortness of breath, wheezing or hemoptysis.  CARDIOVASCULAR: No chest pain, orthopnea, edema.  GASTROINTESTINAL: No nausea, vomiting, diarrhea or abdominal pain.  GENITOURINARY: No dysuria, hematuria.  ENDOCRINE: No polyuria, nocturia,  HEMATOLOGY: No anemia, easy bruising or bleeding SKIN: No rash or lesion. MUSCULOSKELETAL: No joint pain or arthritis.   NEUROLOGIC: No tingling, numbness, weakness.  PSYCHIATRY: No anxiety or depression.   DRUG ALLERGIES:  No Known Allergies  VITALS:  Blood pressure 122/73, pulse 98, temperature 97.9 F (36.6 C), temperature source Oral, resp. rate 18, height 5\' 2"  (1.575 m), weight 104.3 kg (229 lb 15 oz), last menstrual period 04/21/2016, SpO2 100 %.  PHYSICAL EXAMINATION:  VITAL SIGNS: Vitals:   05/13/16 0954 05/13/16 1029  BP: 126/72 122/73  Pulse: 92 98  Resp:  18  Temp: 97.8 F (36.6 C) 97.9 F (36.6 C)   GENERAL:49 y.o.female currently in no acute distress.  HEAD: Normocephalic, atraumatic.  EYES: Pupils equal, round, reactive to light. Extraocular muscles intact. No scleral icterus.  MOUTH: Moist mucosal membrane. Dentition intact. No abscess noted.  EAR, NOSE, THROAT: Clear without exudates. No external lesions.  NECK: Supple. No thyromegaly. No nodules. No JVD.  PULMONARY: Clear to ascultation, without wheeze rails or rhonci. No use of accessory  muscles, Good respiratory effort. good air entry bilaterally CHEST: Nontender to palpation.  CARDIOVASCULAR: S1 and S2. Regular rate and rhythm. No murmurs, rubs, or gallops. No edema. Pedal pulses 2+ bilaterally.  GASTROINTESTINAL: Soft, nontender, nondistended. No masses. Positive bowel sounds. No hepatosplenomegaly.  MUSCULOSKELETAL: No swelling, clubbing, or edema. Range of motion full in all extremities.  NEUROLOGIC: Cranial nerves II through XII are intact. No gross focal neurological deficits. Sensation intact. Reflexes intact.  SKIN: Small area of erythema lateral aspect right foot No ulceration, lesions, rashes, or cyanosis. Skin warm and dry. Turgor intact.  PSYCHIATRIC: Mood, affect within normal limits. The patient is awake, alert and oriented x 3. Insight, judgment intact.      LABORATORY PANEL:   CBC  Recent Labs Lab 05/12/16 0412  WBC 6.3  HGB 10.9*  HCT 34.1*  PLT 167   ------------------------------------------------------------------------------------------------------------------  Chemistries   Recent Labs Lab 05/11/16 1750 05/12/16 0412 05/13/16 0355  NA 134* 136  --   K 3.8 3.8  --   CL 101 102  --   CO2 26 27  --   GLUCOSE 231* 219*  --   BUN 12 20  --   CREATININE 0.77 1.24* 0.96  CALCIUM 8.8* 8.8*  --   AST 17  --   --   ALT 16  --   --   ALKPHOS 62  --   --   BILITOT 0.3  --   --    ------------------------------------------------------------------------------------------------------------------  Cardiac Enzymes No results for input(s): TROPONINI in the last 168 hours. ------------------------------------------------------------------------------------------------------------------  RADIOLOGY:  Dg Chest 1 View  Result Date: 05/12/2016 CLINICAL DATA:  Evaluate PICC line placement EXAM: CHEST 1 VIEW COMPARISON:  08/14/2015 FINDINGS: There is a right arm PICC line with tip in the distal SVC. Normal heart size. No pleural effusion or edema.  No airspace consolidation. IMPRESSION: 1. Right arm PICC line tip is in the distal SVC. Electronically Signed   By: Signa Kellaylor  Stroud M.D.   On: 05/12/2016 18:01   Dg Foot Complete Right  Result Date: 05/12/2016 CLINICAL DATA:  Abscess on lateral side of foot. EXAM: RIGHT FOOT COMPLETE - 3+ VIEW COMPARISON:  Little toe film from 05/02/2016. FINDINGS: Lateral soft tissue swelling is evident. No underlying bony fracture. No subluxation or dislocation. No gross bony destruction to suggest osteomyelitis with lateral view degraded by motion. IMPRESSION: Lateral soft tissue swelling without radiographic evidence of osteomyelitis. Electronically Signed   By: Kennith CenterEric  Mansell M.D.   On: 05/12/2016 16:44    EKG:   Orders placed or performed during the hospital encounter of 11/27/15  . ED EKG  . ED EKG    ASSESSMENT AND PLAN:   Beatrix Shipperndrea Albaugh is a 50 y.o. female presenting with No chief complaint on file. . Admitted 05/11/2016 : Day #: 2 days 1. Diabetic foot ulcer: On broad antibiotics, Incision drainage with podiatry today continue current antibiotics  2. Type 2 diabetes insulin requiring: Hyperglycemia, increase insulin sliding coverage, continue to monitor 3. Essential hypertension continue lisinopril   All the records are reviewed and case discussed with Care Management/Social Workerr. Management plans discussed with the patient, family and they are in agreement.  CODE STATUS: full TOTAL TIME TAKING CARE OF THIS PATIENT: 28 minutes.   POSSIBLE D/C IN 1-2DAYS, DEPENDING ON CLINICAL CONDITION.   [name],  Mardi MainlandDavid K M.D on 05/13/2016 at 10:37 AM  Between 7am to 6pm - Pager - [phone]  After 6pm: House Pager: - [phone]  [facility]  Office  [phone]  CC: Primary care physician; Marikay AlarEric Sonnenberg, MD

## 2016-05-13 NOTE — Progress Notes (Signed)
Pharmacy Antibiotic Note  [name] is a 50 y.o. female admitted on 05/11/2016 with wound infection with abcess.  Pharmacy has been consulted for Vancomycin/Zosyn dosing. (Diabetic pt stepped on toothpick)   Plan: Vancomycin 1250 mg IV every 12 hours.  Goal trough 15-20 mcg/mL. Zosyn 3.375g IV q8h (4 hour infusion).  Will initiated Vancomycin 1000mg  IV x 1 then Vancomycin 1250mg  after 1st dose per stacked dose protocol. Will order Vancomycin trough level prior to the fifth.  8/26:  Scr increased 0.77 > 1.24. Will adjust Vancomycin to 1 gram IV q18h. F/u Scr in am.    Ke 0.040, t1/2 17.33  Vd 50.26 Vancomycin trough changed to 8/228 at 0730.  8/27: Scr improved to 0.96. Will adjust Vancomycin to 1250mg  IV q18h.  Ke= 0.050, t1/2 13.86 Vd 50.  Vanc trough now 8/29 at 1930.    Patient had I& D today 8/28.    Adjusted body weight= 71.78kg Height: 5\' 2"  (157.5 cm) Weight: 229 lb 15 oz (104.3 kg) IBW/kg (Calculated) : 50.1  Ke=0.084 T1/2=8.25 Vd=50.246 Estimated trough=17   Temp (24hrs), Avg:98.2 F (36.8 C), Min:96.8 F (36 C), Max:98.9 F (37.2 C)   Recent Labs Lab 05/11/16 1750 05/12/16 0412 05/13/16 0355  WBC 7.9 6.3  --   CREATININE 0.77 1.24* 0.96    Estimated Creatinine Clearance: 79.5 mL/min (by C-G formula based on SCr of 0.96 mg/dL).    No Known Allergies  Antimicrobials this admission: Vancomycin 8/25 >>  Zosyn 8/25 >>   Microbiology results: 8/25 BCx: in process 8/27- Wound CX: pending   Thank you for allowing pharmacy to be a part of this patient's care.  [name] A 05/13/2016 10:30 AM

## 2016-05-13 NOTE — Care Management Note (Signed)
Case Management Note  Patient Details  Name: [name] MRN: [id number] Date of Birth: 03/18/1966  Subjective/Objective:       49yo Ms [name] Shipperndrea Westergaard chose [facility] if she needs home health after this hospital discharge. May need a RW for home.              Action/Plan:   Expected Discharge Date:                  Expected Discharge Plan:     In-House Referral:     Discharge planning Services     Post Acute Care Choice:    Choice offered to:     DME Arranged:    DME Agency:     HH Arranged:    HH Agency:     Status of Service:     If discussed at [employer] Length of Stay Meetings, dates discussed:    Additional Comments:  Dannel Rafter A, RN 05/13/2016, 3:09 PM

## 2016-05-13 NOTE — Transfer of Care (Signed)
Immediate Anesthesia Transfer of Care Note  Patient: [name]  Procedure(s) Performed: Procedure(s): INCISION AND DRAINAGE (Right)  Patient Location: PACU  Anesthesia Type:General  Level of Consciousness: awake, alert  and oriented  Airway & Oxygen Therapy: Patient Spontanous Breathing and Patient connected to nasal cannula oxygen  Post-op Assessment: Report given to RN and Post -op Vital signs reviewed and stable  Post vital signs: Reviewed and stable  Last Vitals:  Vitals:   05/13/16 0854 05/13/16 0855  BP: (!) 157/80   Pulse: 97   Resp: (!) 29   Temp:  (P) 36.8 C    Last Pain:  Vitals:   05/13/16 0854  TempSrc:   PainSc: (P) Asleep      Patients Stated Pain Goal: 2 (05/12/16 0741)  Complications: No apparent anesthesia complications

## 2016-05-13 NOTE — Anesthesia Preprocedure Evaluation (Signed)
Anesthesia Evaluation  Patient identified by MRN, date of birth, ID band Patient awake    Reviewed: Allergy & Precautions, NPO status , Patient's Chart, lab work & pertinent test results  Airway Mallampati: II  TM Distance: >3 FB     Dental  (+) Chipped   Pulmonary    Pulmonary exam normal        Cardiovascular hypertension, Pt. on medications Normal cardiovascular exam     Neuro/Psych  Headaches, Seizures -, Well Controlled,  PSYCHIATRIC DISORDERS Depression TIACVA    GI/Hepatic negative GI ROS, Neg liver ROS,   Endo/Other  diabetes, Well Controlled, Type 2, Oral Hypoglycemic Agents  Renal/GU   negative genitourinary   Musculoskeletal Foot infection   Abdominal Normal abdominal exam  (+)   Peds negative pediatric ROS (+)  Hematology negative hematology ROS (+)   Anesthesia Other Findings   Reproductive/Obstetrics                             Anesthesia Physical Anesthesia Plan  ASA: III  Anesthesia Plan: General   Post-op Pain Management:    Induction: Intravenous  Airway Management Planned: Nasal Cannula  Additional Equipment:   Intra-op Plan:   Post-operative Plan:   Informed Consent: I have reviewed the patients History and Physical, chart, labs and discussed the procedure including the risks, benefits and alternatives for the proposed anesthesia with the patient or authorized representative who has indicated his/her understanding and acceptance.   Dental advisory given  Plan Discussed with: CRNA and Surgeon  Anesthesia Plan Comments:         Anesthesia Quick Evaluation

## 2016-05-14 ENCOUNTER — Ambulatory Visit: Payer: BLUE CROSS/BLUE SHIELD | Admitting: Family Medicine

## 2016-05-14 ENCOUNTER — Encounter: Payer: Self-pay | Admitting: Podiatry

## 2016-05-14 DIAGNOSIS — Z0289 Encounter for other administrative examinations: Secondary | ICD-10-CM

## 2016-05-14 LAB — GLUCOSE, CAPILLARY
GLUCOSE-CAPILLARY: 204 mg/dL — AB (ref 65–99)
GLUCOSE-CAPILLARY: 184 mg/dL — AB (ref 65–99)
Glucose-Capillary: 263 mg/dL — ABNORMAL HIGH (ref 65–99)
Glucose-Capillary: 130 mg/dL — ABNORMAL HIGH (ref 65–99)

## 2016-05-14 LAB — CREATININE, SERUM
CREATININE: 0.85 mg/dL (ref 0.44–1.00)
GFR calc Af Amer: >60 mL/min (ref >60)
GFR calc non Af Amer: >60 mL/min (ref >60)

## 2016-05-14 MED ORDER — INSULIN DETEMIR 100 UNIT/ML ~~LOC~~ SOLN
10.0000 [IU] | SUBCUTANEOUS | Status: DC
  Administered 2016-05-15: 10 [IU] via SUBCUTANEOUS
  Filled 2016-05-14: qty 0.1

## 2016-05-14 MED ORDER — INSULIN ASPART 100 UNIT/ML ~~LOC~~ SOLN
10.0000 [IU] | Freq: Three times a day (TID) | SUBCUTANEOUS | Status: DC
  Administered 2016-05-14 – 2016-05-15 (×3): 10 [IU] via SUBCUTANEOUS
  Filled 2016-05-14 (×3): qty 10

## 2016-05-14 NOTE — Progress Notes (Signed)
Physical Therapy Treatment Patient Details Name: [name] MRN: [id number] DOB: August 14, 1966 Today's Date: 05/14/2016    History of Present Illness 50 y.o. female with a known history of diabetes, essential hypertension, hyperlipidemia, stroke, legally blind status, who presents to the hospitalist from Dr. Dory Larsen office with right foot swelling. Apparently the patient stepped on a toothpick about a week ago, since a week ago she is been noticing redness swelling significant pain in the right foot. She was given antibiotic therapy in the emergency room on 05/02/2016, Keflex and the Septra DS, with no significant improvement of pain and swelling.   Had I&D of R 5th toe.    PT Comments    Pt agreeable to participate, able to perform supine to sit at EOB with use of rails.  Performed sit to stand transfer from low bed with mod cues for sequencing and hand placement.  Pt required mod/max verbal cues for sequencing and hopping several feet to recliner.  Pt would occasionally place RLE on ground but would correct with verbal cues.  Pt with noted fatigue after transfer.    Follow Up Recommendations  Home health PT     Equipment Recommendations   (possible w/c 2/2 NWB status )    Recommendations for Other Services       Precautions / Restrictions Precautions Precautions: Fall Required Braces or Orthoses: Other Brace/Splint Other Brace/Splint: surgical boot when OOB  Restrictions Weight Bearing Restrictions: Yes RLE Weight Bearing: Non weight bearing    Mobility  Bed Mobility Overal bed mobility: Modified Independent             General bed mobility comments: Add'l time required   Transfers Overall transfer level: Needs assistance Equipment used: Rolling walker (2 wheeled) Transfers: Sit to/from Stand Sit to Stand: Min assist         General transfer comment: cues for hand placement and sequencing, x2 attempts made   Ambulation/Gait Ambulation/Gait assistance: Min  assist Ambulation Distance (Feet): 5 Feet Assistive device: Rolling walker (2 wheeled)       General Gait Details: Min cues to maintain NWB status, pt would occasionally place foot on ground   Stairs            Wheelchair Mobility    Modified Rankin (Stroke Patients Only)       Balance                                    Cognition Arousal/Alertness: Awake/alert Behavior During Therapy: Anxious Overall Cognitive Status: Within Functional Limits for tasks assessed                      Exercises      General Comments        Pertinent Vitals/Pain Pain Assessment: 0-10 Pain Score: 9  Pain Location: R foot  Pain Intervention(s): Limited activity within patient's tolerance;Monitored during session    Home Living                      Prior Function            PT Goals (current goals can now be found in the care plan section) Acute Rehab PT Goals Patient Stated Goal: go home PT Goal Formulation: With patient/family Time For Goal Achievement: 05/26/16 Potential to Achieve Goals: Fair    Frequency  Min 2X/week    PT Plan  Co-evaluation             End of Session Equipment Utilized During Treatment: Gait belt Activity Tolerance: Patient limited by fatigue Patient left: in chair;with call bell/phone within reach;with chair alarm set     Time: [id number] PT Time Calculation (min) (ACUTE ONLY): 18 min  Charges:  $Therapeutic Activity: 8-22 mins                    G Codes:      Eudell Julian 05/14/2016, 12:24 PM

## 2016-05-14 NOTE — Progress Notes (Signed)
Pt was easily awakened at shift assessment, dressing ot R foot was found unwound and in the bed. Pt stated she did not know how this happened. This Clinical research associatewriter redressed her foot, area that surgeon debrided has some sutures intact and and is dry. Dressing placed and ace bandage placed over this. Pt is alert and oriented, lungs are clear bilat, hr is regular, abdomen is soft, bs heard. Pt denied difficulty voiding and is ambulating with a walker with minimal weight bearing to the R foot. Ppp, no edema noted. PICC line intact to R upper arm, site is free of redness and swelling. Since assessment, pt has received pain medication with morning meds. Dr. Clint GuyHower rounded on pt, pt will need to go home with home health and podiatry to clear pt for d/c. Podiatry arrived this evening, pt upset and crying that she will not go home tonight because it is too late for services to be set up and script for pain med will not be available until tomorrow morning.  Pt with good appetite today, srx2, call bell in reach.

## 2016-05-14 NOTE — Progress Notes (Signed)
Met with patient at the bedside.  Discussed recent A1C of 13.6% She did not know what an A1C was - from her notes, it has been high for a long time .   Patient tells me she remembers to take her insulin about 5 times a week.  I discussed the ongoing trouble she will have if she keeps her blood sugars this high, including a stroke that could cause permanent damage that will require full time care, heart attack, dialysis, blindness or amputation.  She did not understand that her recent foot abscess is a result of high blood sugar and that if she doesn't get this blood sugar under control the foot will not heal.  She asked about lap-band surgery (I have asked her to address with her MD) I told her, her MD will not approve any elective surgery with her current blood sugar control.    I discussed the need to change her diet, take her medications and for her to consider seeing a diabetes specialist for intense blood sugar management.  She asked about the insulin pump but I again told her that she cannot even look at the option of an insulin pump until she's checking blood sugars a minimum of 4 times a day.    Patient had no further questions. Referral for outpatient diabetes education would be appropriate if patient is not going to receive home health at discharge.   Gentry Fitz, RN, BA, MHA, CDE Diabetes Coordinator Inpatient Diabetes Program  [phone] (Team Pager) [phone] ([place]) 05/14/2016 2:35 PM

## 2016-05-14 NOTE — Progress Notes (Signed)
Pharmacy Antibiotic Note  [name] is a 50 y.o. female admitted on 05/11/2016 with wound infection with abcess.  Pharmacy has been consulted for Vancomycin/Zosyn dosing. (Diabetic pt stepped on toothpick)   Plan: Vancomycin 1250 mg IV every 12 hours.  Goal trough 15-20 mcg/mL. Zosyn 3.375g IV q8h (4 hour infusion).  Adjusted body weight= 71.78kg Height: 5\' 2"  (157.5 cm) Weight: 229 lb 15 oz (104.3 kg) IBW/kg (Calculated) : 50.1  Ke=0.084 T1/2=8.25 Vd=50.246 Estimated trough=17  Will initiated Vancomycin 1000mg  IV x 1 then Vancomycin 1250mg  after 1st dose per stacked dose protocol. Will order Vancomycin trough level prior to the fifth.  8/26:  Scr increased 0.77 > 1.24. Will adjust Vancomycin to 1 gram IV q18h. F/u Scr in am.    Ke 0.040, t1/2 17.33  Vd 50.26 Vancomycin trough changed to 8/228 at 0730.  8/27: Scr improved to 0.96. Will adjust Vancomycin to 1250mg  IV q18h.  Ke= 0.050, t1/2 13.86 Vd 50.  Vanc trough now 8/29 at 1930.    Patient had I& D today 8/27.  8/28: Scr improved to 0.85. Ke 0.056, half life 12.4.  Pt has received 4 doses of vancomycin so far with daily dose adjustments. Will obtain a trough at 8/29 0130 with the next dose to see where we are. Will continue current order for vancomycin 1250 mg  IV q18h for now.      Temp (24hrs), Avg:98.1 F (36.7 C), Min:97.7 F (36.5 C), Max:98.6 F (37 C)   Recent Labs Lab 05/11/16 1750 05/12/16 0412 05/13/16 0355 05/14/16 0521  WBC 7.9 6.3  --   --   CREATININE 0.77 1.24* 0.96 0.85    Estimated Creatinine Clearance: 89.8 mL/min (by C-G formula based on SCr of 0.85 mg/dL).    No Known Allergies  Antimicrobials this admission: Vancomycin 8/25 >>  Zosyn 8/25 >>   Microbiology results: 8/25 BCx: in process 8/27- Wound CX: pending   Thank you for allowing pharmacy to be a part of this patient's care.  Marty HeckWang, Sierah Lacewell L 05/14/2016 9:35 AM

## 2016-05-14 NOTE — Progress Notes (Signed)
[facility] - [place] at [facility]   PATIENT NAME: [name]    MRN#:  [id number]  DATE OF BIRTH:  01/13/1966  SUBJECTIVE:  Hospital Day: 3 days [name] is a 50 y.o. female presenting with Foot pain.   Overnight events: Incision and drainage performed yesterday Interval Events: complaints about a right-sided foot pain denies fevers chills further symptoms  REVIEW OF SYSTEMS:  CONSTITUTIONAL: No fever, fatigue or weakness.  EYES: No blurred or double vision.  EARS, NOSE, AND THROAT: No tinnitus or ear pain.  RESPIRATORY: No cough, shortness of breath, wheezing or hemoptysis.  CARDIOVASCULAR: No chest pain, orthopnea, edema.  GASTROINTESTINAL: No nausea, vomiting, diarrhea or abdominal pain.  GENITOURINARY: No dysuria, hematuria.  ENDOCRINE: No polyuria, nocturia,  HEMATOLOGY: No anemia, easy bruising or bleeding SKIN: No rash or lesion. MUSCULOSKELETAL: No joint pain or arthritis.   NEUROLOGIC: No tingling, numbness, weakness.  PSYCHIATRY: No anxiety or depression.   DRUG ALLERGIES:  No Known Allergies  VITALS:  Blood pressure 136/84, pulse (!) 102, temperature 98.6 F (37 C), temperature source Oral, resp. rate 16, height 5\' 2"  (1.575 m), weight 104.3 kg (229 lb 15 oz), last menstrual period 04/21/2016, SpO2 98 %.  PHYSICAL EXAMINATION:  VITAL SIGNS: Vitals:   05/14/16 0452 05/14/16 0748  BP: 128/72 136/84  Pulse: (!) 107 (!) 102  Resp: 20 16  Temp: 98.6 F (37 C) 98.6 F (37 C)   GENERAL:49 y.o.female currently in no acute distress.  HEAD: Normocephalic, atraumatic.  EYES: Pupils equal, round, reactive to light. Extraocular muscles intact. No scleral icterus.  MOUTH: Moist mucosal membrane. Dentition intact. No abscess noted.  EAR, NOSE, THROAT: Clear without exudates. No external lesions.  NECK: Supple. No thyromegaly. No nodules. No JVD.  PULMONARY: Clear to ascultation, without wheeze rails or rhonci. No use of accessory muscles,  Good respiratory effort. good air entry bilaterally CHEST: Nontender to palpation.  CARDIOVASCULAR: S1 and S2. Regular rate and rhythm. No murmurs, rubs, or gallops. No edema. Pedal pulses 2+ bilaterally.  GASTROINTESTINAL: Soft, nontender, nondistended. No masses. Positive bowel sounds. No hepatosplenomegaly.  MUSCULOSKELETAL: No swelling, clubbing, or edema. Range of motion full in all extremities. Right foot walking boot NEUROLOGIC: Cranial nerves II through XII are intact. No gross focal neurological deficits. Sensation intact. Reflexes intact.  SKIN: Small area of erythema lateral aspect right foot No ulceration, lesions, rashes, or cyanosis. Skin warm and dry. Turgor intact.  PSYCHIATRIC: Mood, affect within normal limits. The patient is awake, alert and oriented x 3. Insight, judgment intact.      LABORATORY PANEL:   CBC  Recent Labs Lab 05/12/16 0412  WBC 6.3  HGB 10.9*  HCT 34.1*  PLT 167   ------------------------------------------------------------------------------------------------------------------  Chemistries   Recent Labs Lab 05/11/16 1750 05/12/16 0412  05/14/16 0521  NA 134* 136  --   --   K 3.8 3.8  --   --   CL 101 102  --   --   CO2 26 27  --   --   GLUCOSE 231* 219*  --   --   BUN 12 20  --   --   CREATININE 0.77 1.24*  < > 0.85  CALCIUM 8.8* 8.8*  --   --   AST 17  --   --   --   ALT 16  --   --   --   ALKPHOS 62  --   --   --   BILITOT 0.3  --   --   --   < > =  values in this interval not displayed. ------------------------------------------------------------------------------------------------------------------  Cardiac Enzymes No results for input(s): TROPONINI in the last 168 hours. ------------------------------------------------------------------------------------------------------------------  RADIOLOGY:  Dg Chest 1 View  Result Date: 05/12/2016 CLINICAL DATA:  Evaluate PICC line placement EXAM: CHEST 1 VIEW COMPARISON:  08/14/2015  FINDINGS: There is a right arm PICC line with tip in the distal SVC. Normal heart size. No pleural effusion or edema. No airspace consolidation. IMPRESSION: 1. Right arm PICC line tip is in the distal SVC. Electronically Signed   By: Signa Kellaylor  Stroud M.D.   On: 05/12/2016 18:01   Dg Foot Complete Right  Result Date: 05/12/2016 CLINICAL DATA:  Abscess on lateral side of foot. EXAM: RIGHT FOOT COMPLETE - 3+ VIEW COMPARISON:  Little toe film from 05/02/2016. FINDINGS: Lateral soft tissue swelling is evident. No underlying bony fracture. No subluxation or dislocation. No gross bony destruction to suggest osteomyelitis with lateral view degraded by motion. IMPRESSION: Lateral soft tissue swelling without radiographic evidence of osteomyelitis. Electronically Signed   By: Kennith CenterEric  Mansell M.D.   On: 05/12/2016 16:44    EKG:   Orders placed or performed during the hospital encounter of 11/27/15  . ED EKG  . ED EKG    ASSESSMENT AND PLAN:   [name] is a 50 y.o. female presenting with No chief complaint on file. . Admitted 05/11/2016 : Day #: 3 days 1. Diabetic foot ulcer: Discontinue vancomycin and continue Zosyn for now follow culture data podiatry input appreciated  2. Type 2 diabetes insulin requiring: Hyperglycemia, increase insulin sliding coverage, continue to monitor 3. Essential hypertension continue lisinopril   All the records are reviewed and case discussed with Care Management/Social Workerr. Management plans discussed with the patient, family and they are in agreement.  CODE STATUS: full TOTAL TIME TAKING CARE OF THIS PATIENT: 28 minutes.   POSSIBLE D/C IN 1-2DAYS, DEPENDING ON CLINICAL CONDITION.   Hower,  Mardi MainlandDavid K M.D on 05/14/2016 at 12:43 PM  Between 7am to 6pm - Pager - [phone]  After 6pm: House Pager: - [phone]  [facility]  Office  [phone]  CC: Primary care physician; Marikay AlarEric Sonnenberg, MD

## 2016-05-14 NOTE — Progress Notes (Signed)
Daily Progress Note   Subjective  - 1 Day Post-Op  F/u  Right foot I & D.  Doing well  Objective Vitals:   05/13/16 1452 05/13/16 1935 05/14/16 0452 05/14/16 0748  BP: (!) 144/82 121/72 128/72 136/84  Pulse: 96 (!) 101 (!) 107 (!) 102  Resp: 20 20 20 16   Temp: 98 F (36.7 C) 98.1 F (36.7 C) 98.6 F (37 C) 98.6 F (37 C)  TempSrc: Oral Oral Oral Oral  SpO2: 100% 100% 99% 98%  Weight:      Height:        Physical Exam: Right foot incsion doing well.  Packing removed and no purulence from site. Erythema stable and no lymphangitic streaking.  Results for orders placed or performed during the hospital encounter of 05/11/16  Surgical pcr screen     Status: None   Collection Time: 05/12/16  5:44 PM  Result Value Ref Range Status   MRSA, PCR NEGATIVE NEGATIVE Final   Staphylococcus aureus NEGATIVE NEGATIVE Final    Comment:        The Xpert SA Assay (FDA approved for NASAL specimens in patients over 24 years of age), is one component of a comprehensive surveillance program.  Test performance has been validated by [facility] for patients greater than or equal to 23 year old. It is not intended to diagnose infection nor to guide or monitor treatment.   Aerobic/Anaerobic Culture (surgical/deep wound)     Status: None (Preliminary result)   Collection Time: 05/13/16  8:38 AM  Result Value Ref Range Status   Specimen Description ABSCESS RIGHT TOE  Final   Special Requests 5TH TOE JOINT POF ZOSYN VANC AND CEFAZOLINE  Final   Gram Stain   Final    RARE WBC PRESENT, PREDOMINANTLY MONONUCLEAR RARE GRAM NEGATIVE RODS    Culture   Final    NO GROWTH < 24 HOURS Performed at [facility]    Report Status PENDING  Incomplete     Laboratory CBC    Component Value Date/Time   WBC 6.3 05/12/2016 0412   HGB 10.9 (L) 05/12/2016 0412   HGB 10.6 (L) 12/28/2014 0458   HCT 34.1 (L) 05/12/2016 0412   HCT 33.5 (L) 12/28/2014 0458   PLT 167 05/12/2016 0412   PLT 148  (L) 12/28/2014 0458    BMET    Component Value Date/Time   NA 136 05/12/2016 0412   NA 134 (L) 12/28/2014 0458   K 3.8 05/12/2016 0412   K 4.2 12/28/2014 0458   CL 102 05/12/2016 0412   CL 103 12/28/2014 0458   CO2 27 05/12/2016 0412   CO2 26 12/28/2014 0458   GLUCOSE 219 (H) 05/12/2016 0412   GLUCOSE 359 (H) 12/28/2014 0458   BUN 20 05/12/2016 0412   BUN 15 12/28/2014 0458   CREATININE 0.85 05/14/2016 0521   CREATININE 0.77 12/28/2014 0458   CALCIUM 8.8 (L) 05/12/2016 0412   CALCIUM 8.4 (L) 12/28/2014 0458   GFRNONAA >60 05/14/2016 0521   GFRNONAA >60 12/28/2014 0458   GFRAA >60 05/14/2016 0521   GFRAA >60 12/28/2014 0458    Assessment/Planning: Abscess right foot    Doing well and wound is stable.  No active purulence.  Culture with WBC and rare GNR.    Dressing changed and recommend daily dressings with saline flush of open packing site and cover with sterile gauze dressing.  OK for WB to heel in Post op shoe with walker.  F/u with me in  1 week.   Gwyneth RevelsFowler, Clarice Zulauf A  05/14/2016, 5:46 PM

## 2016-05-14 NOTE — Progress Notes (Addendum)
Inpatient Diabetes Program Recommendations  AACE/ADA: New Consensus Statement on Inpatient Glycemic Control (2015)  Target Ranges:  Prepandial:   less than 140 mg/dL      Peak postprandial:   less than 180 mg/dL (1-2 hours)      Critically ill patients:  140 - 180 mg/dL   Lab Results  Component Value Date   GLUCAP 263 (H) 05/14/2016   HGBA1C 13.6 (H) 05/12/2016    Review of Glycemic Control  Results for [name], [name] (MRN [id number]) as of 05/14/2016 11:33  Ref. Range 05/13/2016 07:40 05/13/2016 10:57 05/13/2016 16:52 05/13/2016 21:35 05/14/2016 08:07  Glucose-Capillary Latest Ref Range: 65 - 99 mg/dL 782207 (H) 956262 (H) 213214 (H) 194 (H) 263 (H)    Diabetes history: Type 2 Outpatient Diabetes medications: Metformin 500mg  bid, Levemir 22 units qhs  Current orders for Inpatient glycemic control: Levemir 22 units qhs, Novolog 4 units tid, Novolog 0-20 units tid, Novolog 0-5 units qhs  Inpatient Diabetes Program Recommendations:   Consider increasing Novolog mealtime insulin to 10 units tid (hold if patient eats less than 50%).   Consider adding Levemir 10 units qam.   Based on current CBG and notes from MD visit on 12/02/15- patient likely needs Novolog at each meal at discharge.    Susette RacerJulie Kenyotta Dorfman, RN, BA, MHA, CDE Diabetes Coordinator Inpatient Diabetes Program  [phone] (Team Pager) [phone] [employer] [facility] Office) 05/14/2016 11:38 AM

## 2016-05-14 NOTE — Plan of Care (Signed)
Problem: Bowel/Gastric: Goal: Will not experience complications related to bowel motility Outcome: Progressing Pt is progressing toward goals, anticipate d/c tomorrow. Pt has remained free of falls/injury this shift, continues to use walker to ambulate.

## 2016-05-15 ENCOUNTER — Telehealth: Payer: Self-pay | Admitting: *Deleted

## 2016-05-15 DIAGNOSIS — L02619 Cutaneous abscess of unspecified foot: Secondary | ICD-10-CM

## 2016-05-15 LAB — GLUCOSE, CAPILLARY
GLUCOSE-CAPILLARY: 155 mg/dL — AB (ref 65–99)
Glucose-Capillary: 233 mg/dL — ABNORMAL HIGH (ref 65–99)

## 2016-05-15 MED ORDER — INSULIN DETEMIR 100 UNIT/ML FLEXPEN: 30.0000 [IU] | PEN_INJECTOR | Freq: Every day | SUBCUTANEOUS | 11 refills | Status: DC

## 2016-05-15 MED ORDER — CEPHALEXIN 500 MG PO CAPS: 500.0000 mg | ORAL_CAPSULE | Freq: Two times a day (BID) | ORAL | 0 refills | Status: AC

## 2016-05-15 MED ORDER — INSULIN ASPART 100 UNIT/ML FLEXPEN: 10.0000 [IU] | PEN_INJECTOR | Freq: Three times a day (TID) | SUBCUTANEOUS | 11 refills | Status: DC

## 2016-05-15 MED ORDER — INSULIN DETEMIR 100 UNIT/ML ~~LOC~~ SOLN: 30.0000 [IU] | Freq: Every day | SUBCUTANEOUS | 6 refills | Status: DC

## 2016-05-15 MED ORDER — HYDROCODONE-ACETAMINOPHEN 5-325 MG PO TABS: 1.0000 | ORAL_TABLET | ORAL | 0 refills | Status: DC | PRN

## 2016-05-15 MED ORDER — INSULIN ASPART 100 UNIT/ML ~~LOC~~ SOLN: 10.0000 [IU] | Freq: Three times a day (TID) | SUBCUTANEOUS | 11 refills | Status: DC

## 2016-05-15 NOTE — Telephone Encounter (Signed)
It is okay for patient to have PT orders without an office visit.

## 2016-05-15 NOTE — Care Management Note (Signed)
Case Management Note  Patient Details  Name: Beatrix Shipperndrea Corvin MRN: [id number] Date of Birth: 12/14/1965  Subjective/Objective:    Spoke with patient who is from home and independent. Patient does not want Home Health but preferes outpatient therapy here at the hospital [facility] [phone].  Patient OK for weight bearing but prefers to go home with Wheelchair. [facility] , No other CM needs identified. Action/Plan: Home with outpatient PT.  Expected Discharge Date:                  Expected Discharge Plan:  Home/Self Care  In-House Referral:     Discharge planning Services  CM Consult  Post Acute Care Choice:    Choice offered to:  Patient  DME Arranged:    DME Agency:     HH Arranged:    HH Agency:     Status of Service:  Completed, signed off  If discussed at [employer] Length of Stay Meetings, dates discussed:    Additional Comments:  Adonis HugueninBerkhead, Erica Richwine L, RN 05/15/2016, 9:02 AM

## 2016-05-15 NOTE — Plan of Care (Signed)
Problem: Bowel/Gastric: Goal: Will not experience complications related to bowel motility Outcome: Completed/Met Date Met: 05/15/16 Pt is scheduled for D/C today, is awaiting ride home.

## 2016-05-15 NOTE — Progress Notes (Signed)
Per RN Clinical Social Worker (CSW) consult was put in by accident. Please reconsult if future social work needs arise. CSW signing off.   Baker Hughes IncorporatedBailey Altheia Shafran, LCSW [phone]

## 2016-05-15 NOTE — Telephone Encounter (Signed)
Order placed

## 2016-05-15 NOTE — Telephone Encounter (Signed)
Patient was originally scheduled to see PCP yesterday, was still in the rehab center, being discharged today.  Please advise, does she need an appt first or okay to have PT orders. thanks

## 2016-05-15 NOTE — Discharge Summary (Signed)
[facility] - [place] at [facility]   PATIENT NAME: [name]    MR#:  [id number]  DATE OF BIRTH:  03/10/1966  DATE OF ADMISSION:  05/11/2016 ADMITTING PHYSICIAN: Katharina Caperima Vaickute, MD  DATE OF DISCHARGE: 05/15/16  PRIMARY CARE PHYSICIAN: Marikay AlarEric Sonnenberg, MD    ADMISSION DIAGNOSIS:  right foot abscess right foot infection  DISCHARGE DIAGNOSIS:  Active Problems:   Right foot infection   Foot abscess, right type 2 diabetes, insulin requiring - poorly controlled  SECONDARY DIAGNOSIS:   Past Medical History:  Diagnosis Date  . Allergic rhinitis   . Chickenpox   . Depression   . Diabetes (HCC)   . Epilepsy (HCC)   . Headache   . HTN (hypertension)   . Hyperlipidemia   . Seizures (HCC)   . Stroke (HCC)   . Urinary incontinence     HOSPITAL COURSE:  [name] Shipperndrea Mandala  is a 50 y.o. female admitted 05/11/2016 with chief complaint right foot pain. Please see H&P performed by Katharina Caperima Vaickute, MD for further information. Patient presented to the hospital with the above complaints. Attempted to undergo MRI, but unable to tolerate. Incision and drainage performed with podiatry 05/13/16 without complication.   DISCHARGE CONDITIONS:   stable  CONSULTS OBTAINED:  Treatment Team:  Linus Galasodd Cline, DPM  DRUG ALLERGIES:  No Known Allergies  DISCHARGE MEDICATIONS:   Current Discharge Medication List    START taking these medications   Details  insulin aspart (NOVOLOG) 100 UNIT/ML injection Inject 10 Units into the skin 3 (three) times daily with meals. Qty: 10 mL, Refills: 11      CONTINUE these medications which have CHANGED   Details  cephALEXin (KEFLEX) 500 MG capsule Take 1 capsule (500 mg total) by mouth 2 (two) times daily. Qty: 14 capsule, Refills: 0    HYDROcodone-acetaminophen (NORCO/VICODIN) 5-325 MG tablet Take 1 tablet by mouth every 4 (four) hours as needed for moderate pain. Qty: 20 tablet, Refills: 0    insulin detemir (LEVEMIR) 100 UNIT/ML injection  Inject 0.3 mLs (30 Units total) into the skin at bedtime. Qty: 10 mL, Refills: 6      CONTINUE these medications which have NOT CHANGED   Details  albuterol (PROVENTIL HFA;VENTOLIN HFA) 108 (90 BASE) MCG/ACT inhaler Inhale 2 puffs into the lungs every 6 (six) hours as needed for wheezing or shortness of breath. Qty: 1 Inhaler, Refills: 2    aspirin EC 325 MG EC tablet Take 1 tablet (325 mg total) by mouth daily. Qty: 30 tablet, Refills: 0    atorvastatin (LIPITOR) 80 MG tablet Take 1 tablet (80 mg total) by mouth daily. Qty: 30 tablet, Refills: 2    cyclobenzaprine (FLEXERIL) 5 MG tablet Take 1 tablet (5 mg total) by mouth 3 (three) times daily as needed for muscle spasms. Qty: 30 tablet, Refills: 0    lisinopril-hydrochlorothiazide (PRINZIDE,ZESTORETIC) 20-12.5 MG per tablet Take 1 tablet by mouth daily. Qty: 30 tablet, Refills: 1    metFORMIN (GLUCOPHAGE) 1000 MG tablet Take 500 mg by mouth 2 (two) times daily with a meal.     valproic acid (DEPAKENE) 250 MG capsule Take 2 capsules (500 mg total) by mouth 2 (two) times daily. Qty: 120 capsule, Refills: 0      STOP taking these medications     sulfamethoxazole-trimethoprim (BACTRIM DS,SEPTRA DS) 800-160 MG tablet          DISCHARGE INSTRUCTIONS:     Dressing changed and recommend daily dressings with saline flush of open  packing site and cover with sterile gauze dressing.  OK for WB to heel in Post op shoe with walker.    DIET:  Cardiac diet and Diabetic diet  DISCHARGE CONDITION:  Stable  ACTIVITY:   WB to heel in Post op shoe with walker.  OXYGEN:  Home Oxygen: No.   Oxygen Delivery: room air  DISCHARGE LOCATION:  home   If you experience worsening of your admission symptoms, develop shortness of breath, life threatening emergency, suicidal or homicidal thoughts you must seek medical attention immediately by calling 911 or calling your MD immediately  if symptoms less severe.  You Must read complete  instructions/literature along with all the possible adverse reactions/side effects for all the Medicines you take and that have been prescribed to you. Take any new Medicines after you have completely understood and accpet all the possible adverse reactions/side effects.   Please note  You were cared for by a hospitalist during your hospital stay. If you have any questions about your discharge medications or the care you received while you were in the hospital after you are discharged, you can call the unit and asked to speak with the hospitalist on call if the hospitalist that took care of you is not available. Once you are discharged, your primary care physician will handle any further medical issues. Please note that NO REFILLS for any discharge medications will be authorized once you are discharged, as it is imperative that you return to your primary care physician (or establish a relationship with a primary care physician if you do not have one) for your aftercare needs so that they can reassess your need for medications and monitor your lab values.    On the day of Discharge:   VITAL SIGNS:  Blood pressure 118/78, pulse 85, temperature 97.9 F (36.6 C), temperature source Oral, resp. rate 18, height 5\' 2"  (1.575 m), weight 104.3 kg (229 lb 15 oz), last menstrual period 04/21/2016, SpO2 99 %.  I/O:   Intake/Output Summary (Last 24 hours) at 05/15/16 0829 Last data filed at 05/15/16 0344  Gross per 24 hour  Intake              340 ml  Output                0 ml  Net              340 ml    PHYSICAL EXAMINATION:  GENERAL:  50 y.o.-year-old patient lying in the bed with no acute distress.  EYES: Pupils equal, round, reactive to light and accommodation. No scleral icterus. Extraocular muscles intact.  HEENT: Head atraumatic, normocephalic. Oropharynx and nasopharynx clear.  NECK:  Supple, no jugular venous distention. No thyroid enlargement, no tenderness.  LUNGS: Normal breath sounds  bilaterally, no wheezing, rales,rhonchi or crepitation. No use of accessory muscles of respiration.  CARDIOVASCULAR: S1, S2 normal. No murmurs, rubs, or gallops.  ABDOMEN: Soft, non-tender, non-distended. Bowel sounds present. No organomegaly or mass.  EXTREMITIES: right foot, post op shoe, dressing clean/dry/intact No pedal edema, cyanosis, or clubbing.  NEUROLOGIC: Cranial nerves II through XII are intact. Muscle strength 5/5 in all extremities. Sensation intact. Gait not checked.  PSYCHIATRIC: The patient is alert and oriented x 3.  SKIN: No obvious rash, lesion, or ulcer.   DATA REVIEW:   CBC  Recent Labs Lab 05/12/16 0412  WBC 6.3  HGB 10.9*  HCT 34.1*  PLT 167    Chemistries   Recent Labs Lab 05/11/16  1750 05/12/16 0412  05/14/16 0521  NA 134* 136  --   --   K 3.8 3.8  --   --   CL 101 102  --   --   CO2 26 27  --   --   GLUCOSE 231* 219*  --   --   BUN 12 20  --   --   CREATININE 0.77 1.24*  < > 0.85  CALCIUM 8.8* 8.8*  --   --   AST 17  --   --   --   ALT 16  --   --   --   ALKPHOS 62  --   --   --   BILITOT 0.3  --   --   --   < > = values in this interval not displayed.  Cardiac Enzymes No results for input(s): TROPONINI in the last 168 hours.  Microbiology Results  Results for orders placed or performed during the hospital encounter of 05/11/16  Surgical pcr screen     Status: None   Collection Time: 05/12/16  5:44 PM  Result Value Ref Range Status   MRSA, PCR NEGATIVE NEGATIVE Final   Staphylococcus aureus NEGATIVE NEGATIVE Final    Comment:        The Xpert SA Assay (FDA approved for NASAL specimens in patients over 47 years of age), is one component of a comprehensive surveillance program.  Test performance has been validated by [facility] for patients greater than or equal to 58 year old. It is not intended to diagnose infection nor to guide or monitor treatment.   Aerobic/Anaerobic Culture (surgical/deep wound)     Status: None  (Preliminary result)   Collection Time: 05/13/16  8:38 AM  Result Value Ref Range Status   Specimen Description ABSCESS RIGHT TOE  Final   Special Requests 5TH TOE JOINT POF ZOSYN VANC AND CEFAZOLINE  Final   Gram Stain   Final    RARE WBC PRESENT, PREDOMINANTLY MONONUCLEAR RARE GRAM NEGATIVE RODS    Culture   Final    NO GROWTH < 24 HOURS Performed at [facility]    Report Status PENDING  Incomplete    RADIOLOGY:  No results found.   Management plans discussed with the patient, family and they are in agreement.  CODE STATUS:     Code Status Orders        Start     Ordered   05/11/16 1730  Full code  Continuous     05/11/16 1729    Code Status History    Date Active Date Inactive Code Status Order ID Comments User Context   11/27/2015 12:35 PM 11/28/2015  4:26 PM Full Code [id number]  Houston Siren, MD ED   06/14/2015  6:20 AM 06/15/2015 10:14 PM Full Code [id number]  Arnaldo Natal, MD Inpatient   05/19/2015  1:11 AM 05/19/2015  8:41 PM Full Code [id number]  Crissie Figures, MD Inpatient      TOTAL TIME TAKING CARE OF THIS PATIENT: 33 minutes.    Hower,  [name].D on 05/15/2016 at 8:29 AM  Between 7am to 6pm - Pager - [phone]  After 6pm go to [email or web address] - Social research officer, government  [facility] [place] [facility]  Office  [phone]  CC: Primary care physician; [name], MD

## 2016-05-15 NOTE — Progress Notes (Signed)
Pt stated to charge nurse during d/c instructions that she is not able to draw up her own insluin from the vials, so new order was procured from dr for insulin pens. Pt is dc'd at this time via WC to visitor entrance and waiting family car.

## 2016-05-15 NOTE — Progress Notes (Signed)
Shift assessment completed at 0800,see flowsheet. Pt is awake, alert and oriented, requested pain med and was told by this writer that dosage timing not yet completed, must wait approximately one more hour. Pt has sock and dressing on R foot, PICC line to RUA is intact, dressing is intact with site free of redness and swelling. Pt is asking about d/c, this Clinical research associatewriter explained that MD must round and complete paperwork. Pt received pain medication with AM meds. This Clinical research associatewriter dc'd PICC line per MS order, catheter was intact and pt tolerated well. At this time, pt is waiting in her room for ride home, wheelchair has been delivered to pt, who told staff that she would prefer the wheelchair to use at home as she feels she cannot walk well enough with a walker. This Clinical research associatewriter has explained to pt that she will have to change the dressing to her R foot daily and clean the area with sopa and water, explained that home health nurse will not be visiting to do this, and pt received dressing supplies for several days. Pt has call bell in reach.

## 2016-05-15 NOTE — Telephone Encounter (Signed)
Patient will be discharged from [facility] rehab, [facility] has requested to have orders for pt to have out patient physical therapy.

## 2016-05-15 NOTE — Telephone Encounter (Signed)
Can you please put in the orders to the computer for PT so that they can see them , thanks

## 2016-05-17 ENCOUNTER — Telehealth: Payer: Self-pay | Admitting: Family Medicine

## 2016-05-17 NOTE — Telephone Encounter (Signed)
thanks

## 2016-05-17 NOTE — Telephone Encounter (Signed)
I have attempted to call patient also, phone keeps ringing, no answer, Denisa is this a TCM?

## 2016-05-17 NOTE — Telephone Encounter (Signed)
Use his 1130 slot, thanks

## 2016-05-17 NOTE — Telephone Encounter (Signed)
Please advise, thanks.

## 2016-05-17 NOTE — Telephone Encounter (Signed)
[name] MessierKathy [phone] called from [employer] regarding pt was [facility] and discharged on 05/15/16. Dx was Celulitis off the right lower leg. I called pt to get her scheduled for a HFU but No appt avail to sch pt. Thank you!

## 2016-05-17 NOTE — Telephone Encounter (Signed)
There are several 11:30 appointments available specifically for hospital follow-ups next week to schedule the patient.

## 2016-05-17 NOTE — Telephone Encounter (Signed)
Tanya- No this does not qualify as a TCM.

## 2016-05-17 NOTE — Telephone Encounter (Signed)
Ok. I called pt back no answer. I'll keep trying. Thank you!

## 2016-05-18 ENCOUNTER — Telehealth: Payer: Self-pay | Admitting: Family Medicine

## 2016-05-18 LAB — AEROBIC/ANAEROBIC CULTURE W GRAM STAIN (SURGICAL/DEEP WOUND): Culture: NO GROWTH

## 2016-05-18 NOTE — Telephone Encounter (Signed)
Please advise 

## 2016-05-18 NOTE — Telephone Encounter (Signed)
I have no idea who is managing her PICC line. I was not aware that she had a PICC line. She's not followed up with me yet for her abscess. I do not manage PICC lines and thus if she has a PICC line she will need to be followed through home health for this.

## 2016-05-18 NOTE — Telephone Encounter (Signed)
[name] at [facility] has been informed.

## 2016-05-18 NOTE — Telephone Encounter (Signed)
[name] [phone] called from [facility] regarding wanting to know who is caring for her Picc Line and foot abscess?  Please advise? Thank you!

## 2016-05-18 NOTE — Telephone Encounter (Signed)
Do you know who are caring for these?

## 2016-05-22 ENCOUNTER — Telehealth: Payer: Self-pay | Admitting: Family Medicine

## 2016-05-22 NOTE — Telephone Encounter (Signed)
Olegario MessierKathy from [employer], [phone]. Pt enrolled in case management program. Pt is not taking her medication. She can not afford medication. Pt states that she is going to call office for an appt., as per [facility]Kathy at [facility]

## 2016-05-22 NOTE — Telephone Encounter (Signed)
FYI, thanks.

## 2016-05-23 NOTE — Telephone Encounter (Signed)
Tried to call patient to schedule appointment. No answer and no voicemail set up.

## 2016-05-23 NOTE — Telephone Encounter (Signed)
Please make sure patient is scheduled for follow up

## 2016-05-25 NOTE — Telephone Encounter (Signed)
Called patient to schedule follow up appointment. Someone answered the phone and tried to get the patient to the phone. I heard patient in the background saying that we was calling her for an appointment and she was not coming in for appointment. I ask person that I was speaking with for name and they refused. I told person that we would put in the chart that she refused appointment. She stated that is fine.

## 2016-05-25 NOTE — Telephone Encounter (Signed)
I will forward this message to [name] to see how we proceed. She has refused follow-up after hospitalization and has not followed up since her initial appointment with me in March. Given that she refuses to follow-up I do not feel comfortable refilling any medications moving forward. Could we consider dismissal for non-compliance and not following up?

## 2016-05-28 NOTE — Telephone Encounter (Signed)
Talked with [name], and left note for [name] to talk to me.  [name] can research with [name] what assistance she qualifies for in the next 48 hours and then follow back up with [name] for dismissal/transfer of care.

## 2016-05-29 NOTE — Telephone Encounter (Signed)
So [facility] doesn't [facility], please advise what you would like me to do, thanks

## 2016-05-29 NOTE — Telephone Encounter (Signed)
Rose can you assist and see if this patient qualifies for [facility]?

## 2016-05-29 NOTE — Telephone Encounter (Signed)
-----   Message from Della Gooose M Pierzchala, RN sent at 05/29/2016  9:30 AM EDT ----- Justice BritainHi Tanya,   It looks like pt's primary insurance is [employer] which would not qualify for [facility].  We have seen those  whose primary is Medicare, have a [employer] supplement.   Thanks  Okey Dupreose

## 2016-05-29 NOTE — Telephone Encounter (Signed)
Please check and see whether or not we can get her set up with the social worker to see what resources are available. If we are unable to get her any resources I would suggest that [facility] as she would be able to get potential charity care to help with her cost.

## 2016-05-29 NOTE — Telephone Encounter (Signed)
Melissa or vanessa can you assist with this, I am not sure who to try and set her up with, I tried [facility] and they are a no go.

## 2016-05-30 NOTE — Telephone Encounter (Signed)
Per our discussion earlier can you check with home health to see if they can get her set up with social worker. If she refuses this and continues to refuse to follow-up I would proceed with dismissal.

## 2016-05-30 NOTE — Telephone Encounter (Signed)
[facility], Please advise.

## 2016-05-30 NOTE — Telephone Encounter (Signed)
We don't have access to a Child psychotherapistsocial worker through our office, if she had home health they could do it, otherwise it is on the patient to contact a Child psychotherapistsocial worker. She doesn't qualify [facility].  Per the last notes, patient has refused care even talking on the phone with us, so it seems as if we have a hard stop that we can't make her comply.  We do have the same financial hardship information at the front available to her (that [facility] does) , but again she is not taking calls from us or agreeing to follow up at all.  I believe we have exhausted our options.

## 2016-05-30 NOTE — Telephone Encounter (Signed)
Please attempt to contact the patient for follow-up one additional time and offered her the financial hardship paperwork and if she refuses we will proceed with dismissal paperwork.

## 2016-05-31 NOTE — Telephone Encounter (Signed)
Noted  

## 2016-05-31 NOTE — Telephone Encounter (Signed)
Attempted to reach the patient, The person that answered the phone didn't give me her name, but I asked if the patient would be willing to come and see PCP and she asked when, I asked her if there was a specific time or date that would be better and she said she will need to speak with patient. I advised her to call back with a date and time that works for her to follow up and she agreed to call back.  We will see if she calls back.  Thanks

## 2016-06-05 NOTE — Telephone Encounter (Signed)
Let's discuss, please  Patient has not called back in 5 days.

## 2016-06-06 NOTE — Telephone Encounter (Signed)
I would suggest drafting up the dismissal letter and the dismissal reason would be failure to follow-up.

## 2016-06-07 ENCOUNTER — Telehealth: Payer: Self-pay | Admitting: Family Medicine

## 2016-06-07 NOTE — Telephone Encounter (Signed)
Letter Printed and given to PCP to sign.

## 2016-06-07 NOTE — Telephone Encounter (Signed)
Patient dismissed from [facility] by Marikay AlarEric Sonnenberg MD , effective June 07, 2016 Dismissal letter sent out by certified / registered mail. DAJ

## 2016-06-07 NOTE — Telephone Encounter (Signed)
Sent forms to HIM thanks

## 2016-06-08 NOTE — Telephone Encounter (Signed)
Agree with dismissal since no response from patient, despite multiple efforts.

## 2016-06-13 NOTE — Telephone Encounter (Signed)
Received signed domestic return receipt verifying delivery of certified letter on June 11, 2016. Article number [id number] DAJ

## 2016-06-21 ENCOUNTER — Emergency Department
Admission: EM | Admit: 2016-06-21 | Discharge: 2016-06-21 | Disposition: A | Discharge status: Home / Self Care | Payer: BLUE CROSS/BLUE SHIELD | Attending: Emergency Medicine | Admitting: Emergency Medicine

## 2016-06-21 ENCOUNTER — Encounter: Payer: Self-pay | Admitting: Emergency Medicine

## 2016-06-21 DIAGNOSIS — I10 Essential (primary) hypertension: Secondary | ICD-10-CM | POA: Insufficient documentation

## 2016-06-21 DIAGNOSIS — Z794 Long term (current) use of insulin: Secondary | ICD-10-CM | POA: Insufficient documentation

## 2016-06-21 DIAGNOSIS — E119 Type 2 diabetes mellitus without complications: Secondary | ICD-10-CM | POA: Diagnosis not present

## 2016-06-21 DIAGNOSIS — Z7982 Long term (current) use of aspirin: Secondary | ICD-10-CM | POA: Diagnosis not present

## 2016-06-21 DIAGNOSIS — Z5321 Procedure and treatment not carried out due to patient leaving prior to being seen by health care provider: Secondary | ICD-10-CM | POA: Insufficient documentation

## 2016-06-21 DIAGNOSIS — R109 Unspecified abdominal pain: Secondary | ICD-10-CM | POA: Diagnosis present

## 2016-06-21 LAB — URINALYSIS COMPLETE WITH MICROSCOPIC (ARMC ONLY)
Bacteria, UA: NONE SEEN
Bilirubin Urine: NEGATIVE
Glucose, UA: >500 mg/dL — AB
Hgb urine dipstick: NEGATIVE
Ketones, ur: NEGATIVE mg/dL
Leukocytes, UA: NEGATIVE
Nitrite: NEGATIVE
PROTEIN: NEGATIVE mg/dL
Specific Gravity, Urine: 1.039 — ABNORMAL HIGH (ref 1.005–1.030)
pH: 6 (ref 5.0–8.0)

## 2016-06-21 LAB — CBC
HCT: 40.7 % (ref 35.0–47.0)
HEMOGLOBIN: 12.8 g/dL (ref 12.0–16.0)
MCH: 19.8 pg — AB (ref 26.0–34.0)
MCHC: 31.5 g/dL — ABNORMAL LOW (ref 32.0–36.0)
MCV: 62.7 fL — AB (ref 80.0–100.0)
PLATELETS: 145 10*3/uL — AB (ref 150–440)
RBC: 6.5 MIL/uL — AB (ref 3.80–5.20)
RDW: 18.6 % — ABNORMAL HIGH (ref 11.5–14.5)
WBC: 8.5 10*3/uL (ref 3.6–11.0)

## 2016-06-21 LAB — COMPREHENSIVE METABOLIC PANEL
ALBUMIN: 4.2 g/dL (ref 3.5–5.0)
ALT: 12 U/L — AB (ref 14–54)
AST: 13 U/L — ABNORMAL LOW (ref 15–41)
Alkaline Phosphatase: 55 U/L (ref 38–126)
Anion gap: 9 (ref 5–15)
BUN: 10 mg/dL (ref 6–20)
CHLORIDE: 100 mmol/L — AB (ref 101–111)
CO2: 27 mmol/L (ref 22–32)
CREATININE: 0.73 mg/dL (ref 0.44–1.00)
Calcium: 9 mg/dL (ref 8.9–10.3)
GFR calc non Af Amer: >60 mL/min (ref >60)
GLUCOSE: 377 mg/dL — AB (ref 65–99)
Potassium: 3.4 mmol/L — ABNORMAL LOW (ref 3.5–5.1)
SODIUM: 136 mmol/L (ref 135–145)
Total Bilirubin: 0.7 mg/dL (ref 0.3–1.2)
Total Protein: 7.6 g/dL (ref 6.5–8.1)

## 2016-06-21 LAB — PREGNANCY, URINE: PREG TEST UR: NEGATIVE

## 2016-06-21 LAB — LIPASE, BLOOD: LIPASE: 18 U/L (ref 11–51)

## 2016-06-21 NOTE — ED Triage Notes (Signed)
Pt arrived to the ED for complaints of abdominal pain starting today. Pt reports that the pain kept her in bed all day today and that it might be related to constipation and new antibiotics that she is taking. Pt is AOx4 in no apparent distress.

## 2016-07-15 ENCOUNTER — Emergency Department
Admission: EM | Admit: 2016-07-15 | Discharge: 2016-07-15 | Disposition: A | Discharge status: Home / Self Care | Payer: BLUE CROSS/BLUE SHIELD | Attending: Emergency Medicine | Admitting: Emergency Medicine

## 2016-07-15 ENCOUNTER — Emergency Department: Payer: BLUE CROSS/BLUE SHIELD

## 2016-07-15 ENCOUNTER — Encounter: Payer: Self-pay | Admitting: Emergency Medicine

## 2016-07-15 DIAGNOSIS — Z8673 Personal history of transient ischemic attack (TIA), and cerebral infarction without residual deficits: Secondary | ICD-10-CM | POA: Insufficient documentation

## 2016-07-15 DIAGNOSIS — R200 Anesthesia of skin: Secondary | ICD-10-CM

## 2016-07-15 DIAGNOSIS — Z7982 Long term (current) use of aspirin: Secondary | ICD-10-CM | POA: Diagnosis not present

## 2016-07-15 DIAGNOSIS — R519 Headache, unspecified: Secondary | ICD-10-CM

## 2016-07-15 DIAGNOSIS — E119 Type 2 diabetes mellitus without complications: Secondary | ICD-10-CM | POA: Insufficient documentation

## 2016-07-15 DIAGNOSIS — R202 Paresthesia of skin: Secondary | ICD-10-CM | POA: Diagnosis not present

## 2016-07-15 DIAGNOSIS — I10 Essential (primary) hypertension: Secondary | ICD-10-CM | POA: Insufficient documentation

## 2016-07-15 DIAGNOSIS — R51 Headache: Secondary | ICD-10-CM | POA: Insufficient documentation

## 2016-07-15 DIAGNOSIS — Z794 Long term (current) use of insulin: Secondary | ICD-10-CM | POA: Insufficient documentation

## 2016-07-15 LAB — TROPONIN I

## 2016-07-15 LAB — URINALYSIS COMPLETE WITH MICROSCOPIC (ARMC ONLY)
BACTERIA UA: NONE SEEN
Bilirubin Urine: NEGATIVE
Glucose, UA: >500 mg/dL — AB
Hgb urine dipstick: NEGATIVE
Ketones, ur: NEGATIVE mg/dL
LEUKOCYTES UA: NEGATIVE
Nitrite: NEGATIVE
PH: 7 (ref 5.0–8.0)
PROTEIN: NEGATIVE mg/dL
RBC / HPF: NONE SEEN RBC/hpf (ref 0–5)
SPECIFIC GRAVITY, URINE: 1.024 (ref 1.005–1.030)

## 2016-07-15 LAB — BASIC METABOLIC PANEL
Anion gap: 6 (ref 5–15)
BUN: 11 mg/dL (ref 6–20)
CHLORIDE: 104 mmol/L (ref 101–111)
CO2: 26 mmol/L (ref 22–32)
Calcium: 8.7 mg/dL — ABNORMAL LOW (ref 8.9–10.3)
Creatinine, Ser: 0.56 mg/dL (ref 0.44–1.00)
GFR calc Af Amer: >60 mL/min (ref >60)
GFR calc non Af Amer: >60 mL/min (ref >60)
GLUCOSE: 262 mg/dL — AB (ref 65–99)
POTASSIUM: 3.7 mmol/L (ref 3.5–5.1)
Sodium: 136 mmol/L (ref 135–145)

## 2016-07-15 LAB — GLUCOSE, CAPILLARY: GLUCOSE-CAPILLARY: 286 mg/dL — AB (ref 65–99)

## 2016-07-15 LAB — CBC
HEMATOCRIT: 35.9 % (ref 35.0–47.0)
Hemoglobin: 11.5 g/dL — ABNORMAL LOW (ref 12.0–16.0)
MCH: 20.1 pg — ABNORMAL LOW (ref 26.0–34.0)
MCHC: 32.1 g/dL (ref 32.0–36.0)
MCV: 62.6 fL — AB (ref 80.0–100.0)
Platelets: 155 10*3/uL (ref 150–440)
RBC: 5.73 MIL/uL — ABNORMAL HIGH (ref 3.80–5.20)
RDW: 19.4 % — AB (ref 11.5–14.5)
WBC: 6.3 10*3/uL (ref 3.6–11.0)

## 2016-07-15 LAB — VALPROIC ACID LEVEL: Valproic Acid Lvl: <10 ug/mL — ABNORMAL LOW (ref 50.0–100.0)

## 2016-07-15 MED ORDER — METOCLOPRAMIDE HCL 5 MG/ML IJ SOLN
10.0000 mg | Freq: Once | INTRAMUSCULAR | Status: DC
  Filled 2016-07-15: qty 2

## 2016-07-15 MED ORDER — DIPHENHYDRAMINE HCL 25 MG PO CAPS
25.0000 mg | ORAL_CAPSULE | Freq: Once | ORAL | Status: AC
  Administered 2016-07-15: 25 mg via ORAL
  Filled 2016-07-15: qty 1

## 2016-07-15 MED ORDER — DIPHENHYDRAMINE HCL 50 MG/ML IJ SOLN
25.0000 mg | Freq: Once | INTRAMUSCULAR | Status: DC
  Filled 2016-07-15: qty 1

## 2016-07-15 MED ORDER — BUTALBITAL-APAP-CAFFEINE 50-325-40 MG PO TABS: 1.0000 | ORAL_TABLET | Freq: Four times a day (QID) | ORAL | 0 refills | Status: AC | PRN

## 2016-07-15 MED ORDER — METOCLOPRAMIDE HCL 10 MG PO TABS
10.0000 mg | ORAL_TABLET | Freq: Once | ORAL | Status: AC
  Administered 2016-07-15: 10 mg via ORAL
  Filled 2016-07-15: qty 1

## 2016-07-15 NOTE — ED Notes (Signed)
Patient transported to CT 

## 2016-07-15 NOTE — ED Triage Notes (Signed)
C/O headache and dizziness, being off balance, since yesterday. Onset of symptoms yesterday at 1500.

## 2016-07-15 NOTE — ED Notes (Signed)
Pt transported to MRI via stretcher with Judeth CornfieldStephanie, EDT

## 2016-07-15 NOTE — ED Provider Notes (Signed)
[facility] Emergency Department Provider Note ____________________________________________   I have reviewed the triage vital signs and the triage nursing note.  HISTORY  Chief Complaint Headache and Dizziness   Historian Patient and spouse  HPI [name] is a 50 y.o. female with a history of prior stroke which she states was in 2014 and for which she takes 325 aspirin daily, presents today after awaking yesterday morning with a headache of 7 out of 10 for the rest of the day. She has no history of frequent headaches or migraine diagnosis per her. She states that when she woke up from sleep yesterday morning she was also feeling a rubbery feeling of numbness/tingling of her left arm and left leg and she feels like she's been dragging her left leg since yesterday morning. The stroke she was told it was seen on the CAT scan and she does not really remember having symptoms. She takes Depakote for seizures that it sounds like was post stroke.  Headache now as moderate reporting 8 out of 10. No vision changes although she does have disconjugate gaze which is old per her husband.      Past Medical History:  Diagnosis Date  . Allergic rhinitis   . Chickenpox   . Depression   . Diabetes (HCC)   . Epilepsy (HCC)   . Headache   . HTN (hypertension)   . Hyperlipidemia   . Seizures (HCC)   . Stroke (HCC)   . Urinary incontinence     Patient Active Problem List   Diagnosis Date Noted  . Right foot infection 05/11/2016  . Foot abscess, right 05/11/2016  . Anxiety and depression 12/02/2015  . CVA (cerebral infarction) 11/27/2015  . Epilepsy (HCC) 06/17/2015  . 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 Headache 05/19/2015  . Weakness of left side of body 05/19/2015  . H/O: CVA (cerebrovascular accident) 05/19/2015  . Severe obesity (BMI >= 40) (HCC) 03/19/2014  . HTN (hypertension)   . Diabetes (HCC)   . Hyperlipidemia   . [name] (transient ischemic attack)  03/17/2014    Past Surgical History:  Procedure Laterality Date  . CESAREAN SECTION    . INCISION AND DRAINAGE Right 05/13/2016   Procedure: INCISION AND DRAINAGE;  Surgeon: Gwyneth Revels, DPM;  Location: [facility] ORS;  Service: Podiatry;  Laterality: Right;    Prior to Admission medications   Medication Sig Start Date End Date Taking? Authorizing Provider  etodolac (LODINE) 400 MG tablet Take 400 mg by mouth daily as needed.   Yes Historical Provider, MD  glipiZIDE (GLUCOTROL XL) 5 MG 24 hr tablet Take 5 mg by mouth daily.   Yes Historical Provider, MD  insulin aspart (NOVOLOG FLEXPEN) 100 UNIT/ML FlexPen Inject 10 Units into the skin 3 (three) times daily with meals. 05/15/16  Yes Wyatt Haste, MD  Insulin Detemir (LEVEMIR FLEXPEN) 100 UNIT/ML Pen Inject 30 Units into the skin daily at 10 pm. 05/15/16  Yes Wyatt Haste, MD  lisinopril-hydrochlorothiazide (PRINZIDE,ZESTORETIC) 20-12.5 MG per tablet Take 1 tablet by mouth daily. 04/19/15  Yes Jene Every, MD  metFORMIN (GLUCOPHAGE) 500 MG tablet Take 500 mg by mouth 2 (two) times daily with a meal.   Yes Historical Provider, MD  aspirin EC 325 MG EC tablet Take 1 tablet (325 mg total) by mouth daily. 03/19/14   Layne Benton, NP  cyclobenzaprine (FLEXERIL) 5 MG tablet Take 1 tablet (5 mg total) by mouth 3 (three) times daily as needed for muscle spasms. 11/28/15  Enid Baasadhika Kalisetti, MD  valproic acid (DEPAKENE) 250 MG capsule Take 2 capsules (500 mg total) by mouth 2 (two) times daily. 06/15/15   Milagros LollSrikar Sudini, MD    No Known Allergies  Family History  Problem Relation Age of Onset  . Hypertension Mother   . Diabetes Mellitus II Mother   . Hypertension Father   . Pancreatic cancer Father     Social History Social History  Substance Use Topics  . Smoking status: Never Smoker  . Smokeless tobacco: Never Used  . Alcohol use No    Review of Systems  Constitutional: Negative for fever. Eyes: Negative for visual changes. ENT: Negative  for sore throat. Cardiovascular: Negative for chest pain. Respiratory: Negative for shortness of breath. Gastrointestinal: Negative for abdominal pain, vomiting and diarrhea. Genitourinary: Negative for dysuria. Musculoskeletal: Negative for back pain. Skin: Negative for rash. Neurological: Positive for headache. 10 point Review of Systems otherwise negative ____________________________________________   PHYSICAL EXAM:  VITAL SIGNS: ED Triage Vitals  Enc Vitals Group     BP 07/15/16 0957 (!) 159/86     Pulse Rate 07/15/16 0957 93     Resp 07/15/16 0957 16     Temp 07/15/16 0957 98.2 F (36.8 C)     Temp Source 07/15/16 0957 Oral     SpO2 07/15/16 0957 98 %     Weight 07/15/16 0957 235 lb (106.6 kg)     Height 07/15/16 0957 5\' 2"  (1.575 m)     Head Circumference --      Peak Flow --      Pain Score 07/15/16 0958 10     Pain Loc --      Pain Edu? --      Excl. in GC? --      Constitutional: Alert and oriented. Well appearing and in no distress. HEENT   Head: Normocephalic and atraumatic.      Eyes: Conjunctivae are normal. PERRL. right eye does appear to cross midline, somewhat disconjugate gaze.      Ears:         Nose: No congestion/rhinnorhea.   Mouth/Throat: Mucous membranes are moist.   Neck: No stridor. Cardiovascular/Chest: Normal rate, regular rhythm.  No murmurs, rubs, or gallops. Respiratory: Normal respiratory effort without tachypnea nor retractions. Breath sounds are clear and equal bilaterally. No wheezes/rales/rhonchi. Gastrointestinal: Soft. No distention, no guarding, no rebound. Nontender.    Genitourinary/rectal:Deferred Musculoskeletal: Nontender with normal range of motion in all extremities. No joint effusions.  No lower extremity tenderness.  No edema. Neurologic:  No facial droop. Normal speech and language. Appears to have good and equal strength to upper extremities and lower extremities. Gait not tested. Paresthesia to the left arm and  left leg. Skin:  Skin is warm, dry and intact. No rash noted. Psychiatric: Mood and affect are normal. Speech and behavior are normal. Patient exhibits appropriate insight and judgment.   ____________________________________________  LABS (pertinent positives/negatives)  Labs Reviewed  BASIC METABOLIC PANEL - Abnormal; Notable for the following:       Result Value   Glucose, Bld 262 (*)    Calcium 8.7 (*)    All other components within normal limits  CBC - Abnormal; Notable for the following:    RBC 5.73 (*)    Hemoglobin 11.5 (*)    MCV 62.6 (*)    MCH 20.1 (*)    RDW 19.4 (*)    All other components within normal limits  GLUCOSE, CAPILLARY - Abnormal; Notable for the following:  Glucose-Capillary 286 (*)    All other components within normal limits  VALPROIC ACID LEVEL - Abnormal; Notable for the following:    Valproic Acid Lvl <10 (*)    All other components within normal limits  TROPONIN I  URINALYSIS COMPLETEWITH MICROSCOPIC ([facility] ONLY)  CBG MONITORING, ED    ____________________________________________    EKG I, Governor Rooksebecca Bunnie Lederman, MD, the attending physician have personally viewed and interpreted all ECGs.  90 bpm. Normal sinus rhythm. Narrow QRS. Normal axis. Nonspecific ST and T-wave ____________________________________________  RADIOLOGY All Xrays were viewed by me. Imaging interpreted by Radiologist.  CT head without contrast: IMPRESSION: Chronic right parietotemporal infarct. No acute intracranial abnormality identified.  __________________________________________  PROCEDURES  Procedure(s) performed: None  Critical Care performed: None  ____________________________________________   ED COURSE / ASSESSMENT AND PLAN  Pertinent labs & imaging results that were available during my care of the patient were reviewed by me and considered in my medical decision making (see chart for details).   [name] is here for headache, but on exam it appears  that she is also having numbness asked her further about this she states that also she feels like her left leg was dragging. The onset for all of these symptoms was yesterday upon waking. Symptoms concerning for stroke, but onset well over 24 hours at this point. She is not a candidate for code stroke, however I will send her for CT of the head.  CT head shows old stroke, no acute stroke. Given the symptoms, I will send her for MRI of the brain. I initially offered her hospital admission given neurologic symptoms, but patient states that she does not want to stay overnight in the hospital. I discussed with her obtaining an MRI of the brain and if it shows a stroke she should stay in the hospital, that shows no stroke and the symptoms were now well over 24 hours ago, I think would be reasonable to discharge her home. I am giving her medications that may help with migraine in case this is a complicated migraine type picture.  Patient care will be transferred to oncoming physician Dr. Lenard LancePaduchowski at shift change 3 PM.  MRI brain pending.  Dispo per results.   CONSULTATIONS:     Patient / Family / Caregiver informed of clinical course, medical decision-making process, and agree with plan.     ___________________________________________   FINAL CLINICAL IMPRESSION(S) / ED DIAGNOSES   Final diagnoses:  Numbness and tingling of left arm and leg  Headache, unspecified headache type              Note: This dictation was prepared with Dragon dictation. Any transcriptional errors that result from this process are unintentional    Governor Rooksebecca Carleta Woodrow, MD 07/15/16 1426

## 2016-07-15 NOTE — ED Provider Notes (Signed)
-----------------------------------------   7:56 PM on 07/15/2016 -----------------------------------------  Patient's MRI shows large remote stroke, no acute intracranial abnormality. Patient states she is feeling better, she is hungry and asking to leave. We'll discharge with PCP follow-up.   Minna AntisKevin Ayannah Faddis, MD 07/15/16 [phone]

## 2016-07-15 NOTE — ED Notes (Signed)
Pt husband pacing in hall asking to see Dr. About MRI results. Informed test takes a while and results may not be back but the Dr will come talk to them about results when they do come back. Pt husband verbalized understanding and went back into room.

## 2016-07-15 NOTE — ED Notes (Signed)
Pt asked for urine sample, denies having to use the bathroom at this time

## 2016-07-15 NOTE — ED Notes (Signed)
Attempted to call MRI tech, left voicemail.  

## 2016-07-15 NOTE — ED Notes (Signed)
Pt returned from MRI with Judeth CornfieldStephanie, EDT

## 2016-07-15 NOTE — ED Notes (Signed)
Fleet ContrasRachel RN speaking to pt about checking if MRI results were back yet. Clinton SawyerKailey RN went into room to speak to pt stating results are not back yet. Pt stated she "would attempt to stay."

## 2016-07-15 NOTE — ED Notes (Signed)
Pt asked for sandwich at 1140. Per Dr Shaune PollackLord ordered to remain NPO and pt was informed. 1153 pt was eating pretzels and drinking sweetened iced tea. Pt was reminded that she is supposed to stay NPO and she put away food and drink.

## 2016-07-15 NOTE — ED Notes (Signed)
Judeth CornfieldStephanie, EDT to take pt to MRI.

## 2016-07-15 NOTE — ED Notes (Signed)
Pt ambulating around room. Visitor at bedside.

## 2016-07-15 NOTE — ED Notes (Signed)
MRI tech, Judeth CornfieldStephanie screening pt on phone for MRI.

## 2016-08-12 ENCOUNTER — Emergency Department
Admission: EM | Admit: 2016-08-12 | Discharge: 2016-08-13 | Disposition: A | Discharge status: Home / Self Care | Payer: BLUE CROSS/BLUE SHIELD | Attending: Emergency Medicine | Admitting: Emergency Medicine

## 2016-08-12 ENCOUNTER — Encounter: Payer: Self-pay | Admitting: Emergency Medicine

## 2016-08-12 DIAGNOSIS — N3000 Acute cystitis without hematuria: Secondary | ICD-10-CM

## 2016-08-12 DIAGNOSIS — E119 Type 2 diabetes mellitus without complications: Secondary | ICD-10-CM | POA: Diagnosis not present

## 2016-08-12 DIAGNOSIS — Z79899 Other long term (current) drug therapy: Secondary | ICD-10-CM | POA: Insufficient documentation

## 2016-08-12 DIAGNOSIS — R451 Restlessness and agitation: Secondary | ICD-10-CM

## 2016-08-12 DIAGNOSIS — F05 Delirium due to known physiological condition: Secondary | ICD-10-CM

## 2016-08-12 DIAGNOSIS — Z046 Encounter for general psychiatric examination, requested by authority: Secondary | ICD-10-CM | POA: Diagnosis present

## 2016-08-12 DIAGNOSIS — Z8673 Personal history of transient ischemic attack (TIA), and cerebral infarction without residual deficits: Secondary | ICD-10-CM

## 2016-08-12 DIAGNOSIS — I10 Essential (primary) hypertension: Secondary | ICD-10-CM | POA: Diagnosis not present

## 2016-08-12 DIAGNOSIS — F919 Conduct disorder, unspecified: Secondary | ICD-10-CM | POA: Insufficient documentation

## 2016-08-12 DIAGNOSIS — N390 Urinary tract infection, site not specified: Secondary | ICD-10-CM

## 2016-08-12 LAB — COMPREHENSIVE METABOLIC PANEL
ALT: 15 U/L (ref 14–54)
AST: 14 U/L — AB (ref 15–41)
Albumin: 3.8 g/dL (ref 3.5–5.0)
Alkaline Phosphatase: 53 U/L (ref 38–126)
Anion gap: 9 (ref 5–15)
BUN: 12 mg/dL (ref 6–20)
CHLORIDE: 102 mmol/L (ref 101–111)
CO2: 26 mmol/L (ref 22–32)
CREATININE: 0.97 mg/dL (ref 0.44–1.00)
Calcium: 8.9 mg/dL (ref 8.9–10.3)
GFR calc Af Amer: >60 mL/min (ref >60)
GFR calc non Af Amer: >60 mL/min (ref >60)
Glucose, Bld: 306 mg/dL — ABNORMAL HIGH (ref 65–99)
POTASSIUM: 4 mmol/L (ref 3.5–5.1)
SODIUM: 137 mmol/L (ref 135–145)
Total Bilirubin: 0.7 mg/dL (ref 0.3–1.2)
Total Protein: 7.5 g/dL (ref 6.5–8.1)

## 2016-08-12 LAB — ACETAMINOPHEN LEVEL: Acetaminophen (Tylenol), Serum: <10 ug/mL — ABNORMAL LOW (ref 10–30)

## 2016-08-12 LAB — VALPROIC ACID LEVEL

## 2016-08-12 LAB — SALICYLATE LEVEL

## 2016-08-12 LAB — ETHANOL: Alcohol, Ethyl (B): <5 mg/dL (ref <5)

## 2016-08-12 LAB — GLUCOSE, CAPILLARY
Glucose-Capillary: 267 mg/dL — ABNORMAL HIGH (ref 65–99)
Glucose-Capillary: 355 mg/dL — ABNORMAL HIGH (ref 65–99)

## 2016-08-12 MED ORDER — METFORMIN HCL 500 MG PO TABS
500.0000 mg | ORAL_TABLET | Freq: Two times a day (BID) | ORAL | Status: DC
  Administered 2016-08-12 – 2016-08-13 (×2): 500 mg via ORAL
  Filled 2016-08-12 (×2): qty 1

## 2016-08-12 MED ORDER — LISINOPRIL-HYDROCHLOROTHIAZIDE 20-12.5 MG PO TABS: 1.0000 | ORAL_TABLET | Freq: Every day | ORAL | Status: DC

## 2016-08-12 MED ORDER — LISINOPRIL 20 MG PO TABS
20.0000 mg | ORAL_TABLET | Freq: Every day | ORAL | Status: DC
  Administered 2016-08-12 – 2016-08-13 (×2): 20 mg via ORAL
  Filled 2016-08-12 (×2): qty 1

## 2016-08-12 MED ORDER — VALPROIC ACID 250 MG PO CAPS
500.0000 mg | ORAL_CAPSULE | Freq: Two times a day (BID) | ORAL | Status: DC
  Administered 2016-08-12 – 2016-08-13 (×2): 500 mg via ORAL
  Filled 2016-08-12 (×4): qty 2

## 2016-08-12 MED ORDER — ASPIRIN EC 325 MG PO TBEC
325.0000 mg | DELAYED_RELEASE_TABLET | Freq: Every day | ORAL | Status: DC
  Administered 2016-08-12 – 2016-08-13 (×2): 325 mg via ORAL
  Filled 2016-08-12 (×3): qty 1

## 2016-08-12 MED ORDER — INSULIN ASPART 100 UNIT/ML ~~LOC~~ SOLN
10.0000 [IU] | Freq: Three times a day (TID) | SUBCUTANEOUS | Status: DC
  Administered 2016-08-13 (×2): 10 [IU] via SUBCUTANEOUS
  Filled 2016-08-12 (×2): qty 10

## 2016-08-12 MED ORDER — INSULIN DETEMIR 100 UNIT/ML ~~LOC~~ SOLN
30.0000 [IU] | Freq: Every day | SUBCUTANEOUS | Status: DC
  Administered 2016-08-12: 30 [IU] via SUBCUTANEOUS
  Filled 2016-08-12 (×3): qty 0.3

## 2016-08-12 MED ORDER — HYDROCHLOROTHIAZIDE 12.5 MG PO CAPS
12.5000 mg | ORAL_CAPSULE | Freq: Every day | ORAL | Status: DC
  Administered 2016-08-12 – 2016-08-13 (×2): 12.5 mg via ORAL
  Filled 2016-08-12 (×2): qty 1

## 2016-08-12 MED ORDER — GLIPIZIDE ER 5 MG PO TB24
5.0000 mg | ORAL_TABLET | Freq: Every day | ORAL | Status: DC
  Administered 2016-08-13: 5 mg via ORAL
  Filled 2016-08-12 (×2): qty 1

## 2016-08-12 MED ORDER — BUTALBITAL-APAP-CAFFEINE 50-325-40 MG PO TABS
1.0000 | ORAL_TABLET | Freq: Four times a day (QID) | ORAL | Status: DC | PRN
  Filled 2016-08-12: qty 2

## 2016-08-12 MED ORDER — ACETAMINOPHEN 325 MG PO TABS
650.0000 mg | ORAL_TABLET | Freq: Once | ORAL | Status: AC
  Administered 2016-08-12: 650 mg via ORAL
  Filled 2016-08-12: qty 2

## 2016-08-12 NOTE — ED Triage Notes (Signed)
Patient to ER from home with IVC papers via [employer]. Patient states she is here "to get checked out because of having headaches". Patient's IVC papers state patient has h/o cutting self and SI attempt by overdose; that patient has been threatening husband and has been wandering off in the middle of the night outside. Patient has also been urinating on self and has seemed "out of it".

## 2016-08-12 NOTE — ED Notes (Signed)
Patient presents with a blunted affect during admission and was somewhat guarded during assessment interview. Pt denies SI/HI and AV hallucinations at this time. Patient shows little insight as to why she's been admitted.  Pt reports having a history of depression and a previous hospital admission for depression over a year ago. Patient denies any pain at this time. Patient remains safe with 15 min checks

## 2016-08-12 NOTE — ED Provider Notes (Signed)
[facility] Emergency Department Provider Note   ____________________________________________   I have reviewed the triage vital signs and the nursing notes.   HISTORY  Chief Complaint Psychiatric Evaluation   History limited by: Not Limited   HPI Beatrix Shipperndrea Robbins is a 50 y.o. female with history of CVA who presents to the emergency department today under IVC. Per paperwork the patient has been exhibiting abnormal behavior. The patient herself denies any abnormal behavior, denies going in and out of the house at abnormal times. States that her only issue currently is a headache. The patient has a history of migraines. Did not take any medication today.   Past Medical History:  Diagnosis Date  . Allergic rhinitis   . Chickenpox   . Depression   . Diabetes (HCC)   . Epilepsy (HCC)   . Headache   . HTN (hypertension)   . Hyperlipidemia   . Seizures (HCC)   . Stroke (HCC)   . Urinary incontinence     Patient Active Problem List   Diagnosis Date Noted  . Right foot infection 05/11/2016  . Foot abscess, right 05/11/2016  . Anxiety and depression 12/02/2015  . CVA (cerebral infarction) 11/27/2015  . Epilepsy (HCC) 06/17/2015  . 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 Headache 05/19/2015  . Weakness of left side of body 05/19/2015  . H/O: CVA (cerebrovascular accident) 05/19/2015  . Severe obesity (BMI >= 40) (HCC) 03/19/2014  . HTN (hypertension)   . Diabetes (HCC)   . Hyperlipidemia   . [name] (transient ischemic attack) 03/17/2014    Past Surgical History:  Procedure Laterality Date  . CESAREAN SECTION    . INCISION AND DRAINAGE Right 05/13/2016   Procedure: INCISION AND DRAINAGE;  Surgeon: Gwyneth RevelsJustin Fowler, DPM;  Location: [facility] ORS;  Service: Podiatry;  Laterality: Right;    Prior to Admission medications   Medication Sig Start Date End Date Taking? Authorizing Provider  aspirin EC 325 MG EC tablet Take 1 tablet (325 mg total) by mouth daily. 03/19/14    Layne BentonSharon L Biby, NP  butalbital-acetaminophen-caffeine (FIORICET, ESGIC) 50-325-40 MG tablet Take 1-2 tablets by mouth every 6 (six) hours as needed for headache. 07/15/16 07/15/17  Minna AntisKevin Paduchowski, MD  cyclobenzaprine (FLEXERIL) 5 MG tablet Take 1 tablet (5 mg total) by mouth 3 (three) times daily as needed for muscle spasms. 11/28/15   Enid Baasadhika Kalisetti, MD  etodolac (LODINE) 400 MG tablet Take 400 mg by mouth daily as needed.    Historical Provider, MD  glipiZIDE (GLUCOTROL XL) 5 MG 24 hr tablet Take 5 mg by mouth daily.    Historical Provider, MD  insulin aspart (NOVOLOG FLEXPEN) 100 UNIT/ML FlexPen Inject 10 Units into the skin 3 (three) times daily with meals. 05/15/16   Wyatt Hasteavid K Hower, MD  Insulin Detemir (LEVEMIR FLEXPEN) 100 UNIT/ML Pen Inject 30 Units into the skin daily at 10 pm. 05/15/16   Wyatt Hasteavid K Hower, MD  lisinopril-hydrochlorothiazide (PRINZIDE,ZESTORETIC) 20-12.5 MG per tablet Take 1 tablet by mouth daily. 04/19/15   Jene Everyobert Kinner, MD  metFORMIN (GLUCOPHAGE) 500 MG tablet Take 500 mg by mouth 2 (two) times daily with a meal.    Historical Provider, MD  valproic acid (DEPAKENE) 250 MG capsule Take 2 capsules (500 mg total) by mouth 2 (two) times daily. 06/15/15   Milagros LollSrikar Sudini, MD    Allergies Patient has no known allergies.  Family History  Problem Relation Age of Onset  . Hypertension Mother   . Diabetes Mellitus II Mother   .  Hypertension Father   . Pancreatic cancer Father     Social History Social History  Substance Use Topics  . Smoking status: Never Smoker  . Smokeless tobacco: Never Used  . Alcohol use No    Review of Systems  Constitutional: Negative for fever. Cardiovascular: Negative for chest pain. Respiratory: Negative for shortness of breath. Gastrointestinal: Negative for abdominal pain, vomiting and diarrhea. Genitourinary: Negative for dysuria. Musculoskeletal: Negative for back pain. Skin: Negative for rash. Neurological: Positive for  headache.  10-point ROS otherwise negative.  ____________________________________________   PHYSICAL EXAM:  VITAL SIGNS: ED Triage Vitals [08/12/16 1347]  Enc Vitals Group     BP (!) 152/92     Pulse Rate (!) 103     Resp 20     Temp 97.9 F (36.6 C)     Temp Source Oral     SpO2 99 %     Weight 235 lb (106.6 kg)     Height 5\' 2"  (1.575 m)     Head Circumference      Peak Flow      Pain Score 10   Constitutional: Alert and oriented. Well appearing and in no distress. Eyes: Conjunctivae are normal. Normal extraocular movements. ENT   Head: Normocephalic and atraumatic.   Nose: No congestion/rhinnorhea.   Mouth/Throat: Mucous membranes are moist.   Neck: No stridor. Hematological/Lymphatic/Immunilogical: No cervical lymphadenopathy. Cardiovascular: Normal rate, regular rhythm.  No murmurs, rubs, or gallops. Respiratory: Normal respiratory effort without tachypnea nor retractions. Breath sounds are clear and equal bilaterally. No wheezes/rales/rhonchi. Gastrointestinal: Soft and nontender. No distention.  Genitourinary: Deferred Musculoskeletal: Normal range of motion in all extremities. No lower extremity edema. Neurologic:  Normal speech and language. No gross focal neurologic deficits are appreciated.  Skin:  Skin is warm, dry and intact. No rash noted. Psychiatric: Mood and affect are normal. Speech and behavior are normal. Patient exhibits appropriate insight and judgment.  ____________________________________________    LABS (pertinent positives/negatives)  Na 137 K 4 Glucose 306 Cr 0.97 Depakote <10   ____________________________________________   EKG  None  ____________________________________________    RADIOLOGY  None  ____________________________________________   PROCEDURES  Procedures  ____________________________________________   INITIAL IMPRESSION / ASSESSMENT AND PLAN / ED COURSE  Pertinent labs & imaging  results that were available during my care of the patient were reviewed by me and considered in my medical decision making (see chart for details).  Patient presented to the emergency department today under IVC paperwork because of concerns for abnormal behavior. Patient however is alert and oriented. Given that there is some question of psychiatric illness and husband's IVC will have patient be valve by telemetry site.  Clinical Course    Telemetry psych at this time does not feel the patient requires psychiatric admission. Psychiatry states that they will recind IVC. ____________________________________________   FINAL CLINICAL IMPRESSION(S) / ED DIAGNOSES  Abnormal behavior  Note: This dictation was prepared with Dragon dictation. Any transcriptional errors that result from this process are unintentional    Phineas SemenGraydon Margarett Viti, MD 08/13/16 1448

## 2016-08-12 NOTE — ED Notes (Signed)

## 2016-08-12 NOTE — ED Notes (Signed)
Report was received from Amy H., RN; Pt. Verbalizes no complaints or distress; denies S.I./Hi. Continue to monitor with 15 min. Monitoring. 

## 2016-08-13 DIAGNOSIS — F05 Delirium due to known physiological condition: Secondary | ICD-10-CM | POA: Diagnosis not present

## 2016-08-13 DIAGNOSIS — N390 Urinary tract infection, site not specified: Secondary | ICD-10-CM

## 2016-08-13 LAB — URINALYSIS COMPLETE WITH MICROSCOPIC (ARMC ONLY)
Bilirubin Urine: NEGATIVE
Glucose, UA: >500 mg/dL — AB
Hgb urine dipstick: NEGATIVE
KETONES UR: NEGATIVE mg/dL
Nitrite: NEGATIVE
PROTEIN: NEGATIVE mg/dL
Specific Gravity, Urine: 1.031 — ABNORMAL HIGH (ref 1.005–1.030)
pH: 6 (ref 5.0–8.0)

## 2016-08-13 LAB — URINE DRUG SCREEN, QUALITATIVE (ARMC ONLY)
Amphetamines, Ur Screen: NOT DETECTED
Barbiturates, Ur Screen: NOT DETECTED
Benzodiazepine, Ur Scrn: NOT DETECTED
CANNABINOID 50 NG, UR ~~LOC~~: NOT DETECTED
Cocaine Metabolite,Ur ~~LOC~~: NOT DETECTED
MDMA (ECSTASY) UR SCREEN: NOT DETECTED
Methadone Scn, Ur: NOT DETECTED
Opiate, Ur Screen: NOT DETECTED
PHENCYCLIDINE (PCP) UR S: NOT DETECTED
Tricyclic, Ur Screen: NOT DETECTED

## 2016-08-13 LAB — GLUCOSE, CAPILLARY
GLUCOSE-CAPILLARY: 293 mg/dL — AB (ref 65–99)
GLUCOSE-CAPILLARY: 282 mg/dL — AB (ref 65–99)

## 2016-08-13 MED ORDER — SULFAMETHOXAZOLE-TRIMETHOPRIM 800-160 MG PO TABS: 1.0000 | ORAL_TABLET | Freq: Two times a day (BID) | ORAL | 0 refills | Status: DC

## 2016-08-13 NOTE — Progress Notes (Signed)
Spoke with ED Dr. And notified him of request for TTS orders. ED Dr. Charline BillsStated will submit TTS consult orders Derrek Puff K. Sherlon HandingHarris, LCAS-A, LPC-A, [facility]  Counselor 08/13/2016 9:38 AM

## 2016-08-13 NOTE — ED Provider Notes (Signed)
-----------------------------------------   1:20 PM on 08/13/2016 -----------------------------------------   Blood pressure (!) 161/105, pulse (!) 101, temperature 98.8 F (37.1 C), temperature source Oral, resp. rate 16, height 5\' 2"  (1.575 m), weight 235 lb (106.6 kg), last menstrual period 07/17/2016, SpO2 99 %.  The patient had no acute events since last update.  Calm and cooperative at this time.  Case discussed with psychiatry, feels patient is medically and psychiatrically stable. She does have a urinary tract infection. We'll send a urine culture. Patient started on Bactrim by Dr. Toni Amendlapacs. Follow-up with [name] and PCP.   Sharman CheekPhillip Jeromey Kruer, MD 08/13/16 1321

## 2016-08-13 NOTE — ED Notes (Signed)
Pt discharged to lobby accompanied by husband . Pt was stable and appreciative at that time. All papers and prescription were given and belongings returned.  Denies SI/HI and A/VH. Pt given opportunity to express concerns and ask questions.
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
## 2016-08-13 NOTE — ED Notes (Signed)
Went to wake patient to get urine. Patient would arouse and go back to sleep. This Clinical research associatewriter tried multiple times with no success.

## 2016-08-13 NOTE — Progress Notes (Signed)
Spoke with ED Dr. Regarding pt. Disposition. Ed Dr. Charline BillsStated at this time it was reasonable to put in a psych consult order and have Dr. Toni Amendlapacs to see pt. Trevor Duty K. Sherlon HandingHarris, LCAS-A, LPC-A, [facility]  Counselor 08/13/2016 10:23 AM

## 2016-08-13 NOTE — ED Notes (Signed)
Pt incontinent x2.

## 2016-08-13 NOTE — BH Assessment (Signed)
Tele Assessment Note   [name] is an 50 y.o. female, African American who presents to [facility] per ED report: female with history of CVA who presents to the emergency department today under IVC. Per paperwork the patient has been exhibiting abnormal behavior. The patient herself denies any abnormal behavior, denies going in and out of the house at abnormal times. States that her only issue currently is a headache. The patient has a history of migraines. Did not take any medication today. Patient states that she does not know exactly why she is here and that she recalls her husband saying some things to the police and then she wound up in the hospital. Patient states that she lives with husband and that she has had no recent change in sleep, receives 6 hours or more per night. Patient denies current SI/HI and AVH. Patient denies hx. Of S.A., and hx. Of prior inpatient psychiatric care. Patient also states that she is not seen outpatient for psychiatric care.   Patient is dressed in scrubs and is alert and oriented x4. Patient speech was within normal limits and motor behavior appeared normal. Patient thought process is coherent. Patient does not appear to be responding to internal stimuli. Patient was cooperative throughout the assessment.   Diagnosis: Major Depressive Disorder  Past Medical History:  Past Medical History:  Diagnosis Date  . Allergic rhinitis   . Chickenpox   . Depression   . Diabetes (HCC)   . Epilepsy (HCC)   . Headache   . HTN (hypertension)   . Hyperlipidemia   . Seizures (HCC)   . Stroke (HCC)   . Urinary incontinence     Past Surgical History:  Procedure Laterality Date  . CESAREAN SECTION    . INCISION AND DRAINAGE Right 05/13/2016   Procedure: INCISION AND DRAINAGE;  Surgeon: Gwyneth Revels, DPM;  Location: [facility] ORS;  Service: Podiatry;  Laterality: Right;    Family History:  Family History  Problem Relation Age of Onset  . Hypertension Mother   . Diabetes  Mellitus II Mother   . Hypertension Father   . Pancreatic cancer Father     Social History:  reports that she has never smoked. She has never used smokeless tobacco. She reports that she does not drink alcohol or use drugs.  Additional Social History:  Alcohol / Drug Use Pain Medications: SEE MAR Prescriptions: SEE MAR Over the Counter: SEE MAR History of alcohol / drug use?: No history of alcohol / drug abuse  CIWA: CIWA-Ar BP: (!) 161/105 Pulse Rate: (!) 101 COWS:    PATIENT STRENGTHS: (choose at least two) Active sense of humor Average or above average intelligence Communication skills  Allergies: No Known Allergies  Home Medications:  (Not in a hospital admission)  OB/GYN Status:  Patient's last menstrual period was 07/17/2016 (approximate).  General Assessment Data Location of Assessment: [facility] ED TTS Assessment: In system Is this a Tele or Face-to-Face Assessment?: Face-to-Face Is this an Initial Assessment or a Re-assessment for this encounter?: Initial Assessment Marital status: Married Is patient pregnant?: No Pregnancy Status: No Living Arrangements: Spouse/significant other Can pt return to current living arrangement?: Yes Admission Status: Voluntary Is patient capable of signing voluntary admission?: Yes Referral Source: Other Insurance type:  Herbalist)     Crisis Care Plan Living Arrangements: Spouse/significant other Name of Psychiatrist: none Name of Therapist: none  Education Status Is patient currently in school?: No Current Grade: n/a Highest grade of school patient has completed: unspecified Name of school:  n/a Contact person: husband , Lahoma RockerDana Pingleton  Risk to self with the past 6 months Suicidal Ideation: No Has patient been a risk to self within the past 6 months prior to admission? : No Suicidal Intent: No Has patient had any suicidal intent within the past 6 months prior to admission? : No Is patient at risk for suicide?: No Suicidal  Plan?: No Has patient had any suicidal plan within the past 6 months prior to admission? : No Access to Means: No What has been your use of drugs/alcohol within the last 12 months?: none Previous Attempts/Gestures: No How many times?: 0 Other Self Harm Risks: none noted Triggers for Past Attempts: None known Intentional Self Injurious Behavior: None Family Suicide History: No Recent stressful life event(s): Turmoil (Comment) Persecutory voices/beliefs?: No Depression: Yes Depression Symptoms: Despondent, Tearfulness, Isolating, Fatigue, Guilt, Loss of interest in usual pleasures Substance abuse history and/or treatment for substance abuse?: No Suicide prevention information given to non-admitted patients: Not applicable  Risk to Others within the past 6 months Homicidal Ideation: No Does patient have any lifetime risk of violence toward others beyond the six months prior to admission? : No Thoughts of Harm to Others: No Current Homicidal Intent: No Current Homicidal Plan: No Access to Homicidal Means: No Identified Victim: none History of harm to others?: No Assessment of Violence: None Noted Violent Behavior Description: n/a Does patient have access to weapons?: No Criminal Charges Pending?: No Does patient have a court date: No Is patient on probation?: No  Psychosis Hallucinations: None noted Delusions: None noted  Mental Status Report Appearance/Hygiene: In scrubs Eye Contact: Poor Motor Activity: Freedom of movement Speech: Logical/coherent Level of Consciousness: Alert Mood: Despair Affect: Anxious Anxiety Level: Moderate Thought Processes: Coherent, Relevant Judgement: Partial Orientation: Person, Place, Time, Situation, Appropriate for developmental age Obsessive Compulsive Thoughts/Behaviors: Minimal  Cognitive Functioning Concentration: Decreased Memory: Remote Intact, Recent Impaired IQ: Average Insight: Fair Impulse Control: Fair Appetite:  Fair Weight Loss: 0 Weight Gain: 0 Sleep: No Change Total Hours of Sleep: 6 Vegetative Symptoms: None  ADLScreening [facility] Assessment Services) Patient's cognitive ability adequate to safely complete daily activities?: Yes Patient able to express need for assistance with ADLs?: Yes Independently performs ADLs?: Yes (appropriate for developmental age)  Prior Inpatient Therapy Prior Inpatient Therapy: No Prior Therapy Dates: n/a Prior Therapy Facilty/Provider(s): n/a Reason for Treatment: n/a  Prior Outpatient Therapy Prior Outpatient Therapy: No (pt states is not seen outpatient) Prior Therapy Dates:  (n/a) Prior Therapy Facilty/Provider(s): n/a Reason for Treatment: n/a Does patient have an ACCT team?: No Does patient have Intensive In-House Services?  : No Does patient have [facility] services? : No Does patient have P4CC services?: No  ADL Screening (condition at time of admission) Patient's cognitive ability adequate to safely complete daily activities?: Yes Is the patient deaf or have difficulty hearing?: No Does the patient have difficulty seeing, even when wearing glasses/contacts?: No Does the patient have difficulty concentrating, remembering, or making decisions?: No Patient able to express need for assistance with ADLs?: Yes Does the patient have difficulty dressing or bathing?: No Independently performs ADLs?: Yes (appropriate for developmental age) Does the patient have difficulty walking or climbing stairs?: No Weakness of Legs: None Weakness of Arms/Hands: None       Abuse/Neglect Assessment (Assessment to be complete while patient is alone) Physical Abuse: Denies Verbal Abuse: Denies Sexual Abuse: Denies Exploitation of patient/patient's resources: Denies Self-Neglect: Denies Values / Beliefs Cultural Requests During Hospitalization: None Spiritual Requests During Hospitalization: None  Advance Directives (For Healthcare) Does Patient Have a Medical  Advance Directive?: No Would patient like information on creating a medical advance directive?: No - Patient declined    Additional Information 1:1 In Past 12 Months?: No CIRT Risk: No Elopement Risk: No Does patient have medical clearance?: No     Disposition:  Disposition Initial Assessment Completed for this Encounter: Yes Disposition of Patient: Other dispositions (TBD)  Vernell Back K Elvan Ebron 08/13/2016 10:46 AM

## 2016-08-13 NOTE — ED Provider Notes (Signed)
-----------------------------------------   4:43 AM on 08/13/2016 -----------------------------------------   Blood pressure (!) 161/105, pulse (!) 101, temperature 98.8 F (37.1 C), temperature source Oral, resp. rate 16, height 5\' 2"  (1.575 m), weight 235 lb (106.6 kg), last menstrual period 07/17/2016, SpO2 99 %.  Patient evaluated by telepsychiatric and deemed cleared for discharged. Psych lifted IVC paperwork. Labs WNL. UA pending. Plan to dc home after UA. Care transferred to Dr. Lajuana MatteStafford   [place] Byren Pankow, MD 08/13/16 [phone]

## 2016-08-13 NOTE — ED Notes (Signed)
Pt presents initially with a blunted affect and currently denies SI/HI and AVH. Pt shows little insight as to why she was initially brought to Ed. Pt compliant with medications Pt supported emotionally and encouraged to express concerns and ask questions.  . Pt remains safe with 15 minute checks.

## 2016-08-13 NOTE — Progress Notes (Signed)
Tried to contact husband of pt. Pt. States will answer to Lahoma Rockerana Wirtz. This Clinical research associatewriter was give phone number [phone] when answered person stated that this was wrong number. This Clinical research associatewriter asked if this was Lahoma Rockerana Sawin or Mr. [name]. Spoke with patient to confirm phone number and pt. Stated number must have changed. Attempted to contact phone number [phone] given via pt. And asked if this was Lahoma RockerDana Lavey and was told no that this was the wrong number.  This Clinical research associatewriter also attempted to contact phone number on MAR [phone]. No answer, and was unable to verify voice identification so, no message was left.  Pang Robers K. Sherlon HandingHarris, LCAS-A, LPC-A, [facility]  Counselor 08/13/2016 10:19 AM

## 2016-08-14 ENCOUNTER — Emergency Department (HOSPITAL_COMMUNITY): Payer: BLUE CROSS/BLUE SHIELD

## 2016-08-14 ENCOUNTER — Encounter (HOSPITAL_COMMUNITY): Payer: Self-pay | Admitting: Emergency Medicine

## 2016-08-14 ENCOUNTER — Inpatient Hospital Stay (HOSPITAL_COMMUNITY)
Admission: EM | Admit: 2016-08-14 | Discharge: 2016-08-16 | DRG: 065 | Disposition: A | Discharge status: Home Health | Payer: BLUE CROSS/BLUE SHIELD | Attending: Internal Medicine | Admitting: Internal Medicine

## 2016-08-14 DIAGNOSIS — E1165 Type 2 diabetes mellitus with hyperglycemia: Secondary | ICD-10-CM

## 2016-08-14 DIAGNOSIS — Z6841 Body Mass Index (BMI) 40.0 and over, adult: Secondary | ICD-10-CM | POA: Diagnosis not present

## 2016-08-14 DIAGNOSIS — I63511 Cerebral infarction due to unspecified occlusion or stenosis of right middle cerebral artery: Principal | ICD-10-CM | POA: Diagnosis present

## 2016-08-14 DIAGNOSIS — Z9114 Patient's other noncompliance with medication regimen: Secondary | ICD-10-CM

## 2016-08-14 DIAGNOSIS — Z7982 Long term (current) use of aspirin: Secondary | ICD-10-CM | POA: Diagnosis not present

## 2016-08-14 DIAGNOSIS — Z833 Family history of diabetes mellitus: Secondary | ICD-10-CM | POA: Diagnosis not present

## 2016-08-14 DIAGNOSIS — B9620 Unspecified Escherichia coli [E. coli] as the cause of diseases classified elsewhere: Secondary | ICD-10-CM | POA: Diagnosis present

## 2016-08-14 DIAGNOSIS — Z800 Family history of malignant neoplasm of digestive organs: Secondary | ICD-10-CM | POA: Diagnosis not present

## 2016-08-14 DIAGNOSIS — R531 Weakness: Secondary | ICD-10-CM

## 2016-08-14 DIAGNOSIS — R29810 Facial weakness: Secondary | ICD-10-CM | POA: Diagnosis present

## 2016-08-14 DIAGNOSIS — I6789 Other cerebrovascular disease: Secondary | ICD-10-CM | POA: Diagnosis not present

## 2016-08-14 DIAGNOSIS — I69354 Hemiplegia and hemiparesis following cerebral infarction affecting left non-dominant side: Secondary | ICD-10-CM

## 2016-08-14 DIAGNOSIS — I6300 Cerebral infarction due to thrombosis of unspecified precerebral artery: Secondary | ICD-10-CM | POA: Diagnosis not present

## 2016-08-14 DIAGNOSIS — E119 Type 2 diabetes mellitus without complications: Secondary | ICD-10-CM

## 2016-08-14 DIAGNOSIS — E1151 Type 2 diabetes mellitus with diabetic peripheral angiopathy without gangrene: Secondary | ICD-10-CM | POA: Diagnosis present

## 2016-08-14 DIAGNOSIS — E785 Hyperlipidemia, unspecified: Secondary | ICD-10-CM | POA: Diagnosis present

## 2016-08-14 DIAGNOSIS — I639 Cerebral infarction, unspecified: Secondary | ICD-10-CM | POA: Diagnosis not present

## 2016-08-14 DIAGNOSIS — Z794 Long term (current) use of insulin: Secondary | ICD-10-CM

## 2016-08-14 DIAGNOSIS — N390 Urinary tract infection, site not specified: Secondary | ICD-10-CM | POA: Diagnosis present

## 2016-08-14 DIAGNOSIS — I10 Essential (primary) hypertension: Secondary | ICD-10-CM | POA: Diagnosis present

## 2016-08-14 DIAGNOSIS — G40909 Epilepsy, unspecified, not intractable, without status epilepticus: Secondary | ICD-10-CM | POA: Diagnosis present

## 2016-08-14 DIAGNOSIS — Z8249 Family history of ischemic heart disease and other diseases of the circulatory system: Secondary | ICD-10-CM

## 2016-08-14 LAB — CBC WITH DIFFERENTIAL/PLATELET
BASOS ABS: 0 10*3/uL (ref 0.0–0.1)
Basophils Relative: 0 %
EOS ABS: 0.1 10*3/uL (ref 0.0–0.7)
Eosinophils Relative: 2 %
HEMATOCRIT: 40.6 % (ref 36.0–46.0)
HEMOGLOBIN: 13.2 g/dL (ref 12.0–15.0)
LYMPHS PCT: 26 %
Lymphs Abs: 1.5 10*3/uL (ref 0.7–4.0)
MCH: 19.8 pg — ABNORMAL LOW (ref 26.0–34.0)
MCHC: 32.5 g/dL (ref 30.0–36.0)
MCV: 61.1 fL — ABNORMAL LOW (ref 78.0–100.0)
MONOS PCT: 10 %
Monocytes Absolute: 0.6 10*3/uL (ref 0.1–1.0)
Neutro Abs: 3.6 10*3/uL (ref 1.7–7.7)
Neutrophils Relative %: 62 %
Platelets: 149 10*3/uL — ABNORMAL LOW (ref 150–400)
RBC: 6.65 MIL/uL — AB (ref 3.87–5.11)
RDW: 18.9 % — AB (ref 11.5–15.5)
WBC: 5.8 10*3/uL (ref 4.0–10.5)

## 2016-08-14 LAB — COMPREHENSIVE METABOLIC PANEL
ALK PHOS: 52 U/L (ref 38–126)
ALT: 14 U/L (ref 14–54)
AST: 18 U/L (ref 15–41)
Albumin: 3.9 g/dL (ref 3.5–5.0)
Anion gap: 10 (ref 5–15)
BILIRUBIN TOTAL: 0.3 mg/dL (ref 0.3–1.2)
BUN: 16 mg/dL (ref 6–20)
CALCIUM: 9.6 mg/dL (ref 8.9–10.3)
CO2: 23 mmol/L (ref 22–32)
CREATININE: 0.88 mg/dL (ref 0.44–1.00)
Chloride: 103 mmol/L (ref 101–111)
Glucose, Bld: 311 mg/dL — ABNORMAL HIGH (ref 65–99)
Potassium: 4.3 mmol/L (ref 3.5–5.1)
SODIUM: 136 mmol/L (ref 135–145)
TOTAL PROTEIN: 6.8 g/dL (ref 6.5–8.1)

## 2016-08-14 LAB — GLUCOSE, CAPILLARY: GLUCOSE-CAPILLARY: 280 mg/dL — AB (ref 65–99)

## 2016-08-14 LAB — CBG MONITORING, ED: Glucose-Capillary: 311 mg/dL — ABNORMAL HIGH (ref 65–99)

## 2016-08-14 LAB — I-STAT TROPONIN, ED: Troponin i, poc: 0.02 ng/mL (ref 0.00–0.08)

## 2016-08-14 LAB — ETHANOL: Alcohol, Ethyl (B): <5 mg/dL (ref <5)

## 2016-08-14 MED ORDER — HYDROCHLOROTHIAZIDE 12.5 MG PO CAPS
12.5000 mg | ORAL_CAPSULE | Freq: Every day | ORAL | Status: DC
  Administered 2016-08-14 – 2016-08-16 (×3): 12.5 mg via ORAL
  Filled 2016-08-14 (×3): qty 1

## 2016-08-14 MED ORDER — STROKE: EARLY STAGES OF RECOVERY BOOK
Freq: Once | Status: AC
  Administered 2016-08-14: 22:00:00

## 2016-08-14 MED ORDER — INSULIN DETEMIR 100 UNIT/ML FLEXPEN: 30.0000 [IU] | PEN_INJECTOR | Freq: Every day | SUBCUTANEOUS | Status: DC

## 2016-08-14 MED ORDER — ACETAMINOPHEN 325 MG PO TABS: 650.0000 mg | ORAL_TABLET | ORAL | Status: DC | PRN

## 2016-08-14 MED ORDER — LISINOPRIL 20 MG PO TABS
20.0000 mg | ORAL_TABLET | Freq: Every day | ORAL | Status: DC
  Administered 2016-08-14 – 2016-08-16 (×3): 20 mg via ORAL
  Filled 2016-08-14 (×3): qty 1

## 2016-08-14 MED ORDER — ASPIRIN EC 325 MG PO TBEC
325.0000 mg | DELAYED_RELEASE_TABLET | Freq: Every day | ORAL | Status: DC
  Administered 2016-08-15 – 2016-08-16 (×2): 325 mg via ORAL
  Filled 2016-08-14 (×2): qty 1

## 2016-08-14 MED ORDER — ACETAMINOPHEN 650 MG RE SUPP: 650.0000 mg | RECTAL | Status: DC | PRN

## 2016-08-14 MED ORDER — LORAZEPAM 2 MG/ML IJ SOLN: 1.0000 mg | Freq: Once | INTRAMUSCULAR | Status: DC

## 2016-08-14 MED ORDER — INSULIN ASPART 100 UNIT/ML ~~LOC~~ SOLN
0.0000 [IU] | Freq: Three times a day (TID) | SUBCUTANEOUS | Status: DC
  Administered 2016-08-15: 7 [IU] via SUBCUTANEOUS
  Administered 2016-08-15 – 2016-08-16 (×2): 15 [IU] via SUBCUTANEOUS
  Administered 2016-08-16: 11 [IU] via SUBCUTANEOUS
  Administered 2016-08-16: 15 [IU] via SUBCUTANEOUS

## 2016-08-14 MED ORDER — INSULIN ASPART 100 UNIT/ML ~~LOC~~ SOLN
0.0000 [IU] | Freq: Every day | SUBCUTANEOUS | Status: DC
  Administered 2016-08-14: 3 [IU] via SUBCUTANEOUS
  Administered 2016-08-15: 4 [IU] via SUBCUTANEOUS

## 2016-08-14 MED ORDER — LISINOPRIL-HYDROCHLOROTHIAZIDE 20-12.5 MG PO TABS: 1.0000 | ORAL_TABLET | Freq: Every day | ORAL | Status: DC

## 2016-08-14 MED ORDER — DIVALPROEX SODIUM ER 500 MG PO TB24
500.0000 mg | ORAL_TABLET | Freq: Every day | ORAL | Status: DC
  Administered 2016-08-15 – 2016-08-16 (×2): 500 mg via ORAL
  Filled 2016-08-14 (×2): qty 1

## 2016-08-14 MED ORDER — INSULIN DETEMIR 100 UNIT/ML ~~LOC~~ SOLN
30.0000 [IU] | Freq: Every day | SUBCUTANEOUS | Status: DC
  Administered 2016-08-14: 30 [IU] via SUBCUTANEOUS
  Filled 2016-08-14 (×2): qty 0.3

## 2016-08-14 NOTE — ED Notes (Signed)
EKG given to Dr Steinl °

## 2016-08-14 NOTE — ED Provider Notes (Signed)
Patient has remained asymptomatic while in the ED. No neurological deficits on exam. MRI/MRA has resulted which reveals acute nonhemorrhagic left paracentral pontine infarct as well as a acute/subacute infarct anterior margin of the posterior limb of the left internal capsule. Patient also has diffuse small vessel disease but no aneurysm seen. Spoke with Dr. Roseanne RenoStewart with neurology who recommends admission to the hospital medicine. He will consult patient in the ED. Spoke with Dr. Montez Moritaarter with hospitalist who will admit patient to telemetry.  Patient was discussed with and seen by Dr. Fayrene FearingJames who agrees with the treatment plan.     [name] KinsmanSamantha Tripp [name], PA-C 08/14/16 2140    [name] PorterMark James, MD 08/25/16 [phone]

## 2016-08-14 NOTE — ED Notes (Signed)
Patient transported to MRI 

## 2016-08-14 NOTE — ED Notes (Signed)
Pt's CBG 311.  Informed Hope, RN and Tobi Bastosnna, Charity fundraiserN.

## 2016-08-14 NOTE — ED Notes (Addendum)
Pt found in waiting room. Pt returned to room in NAD. Pt requesting to go home. Pt refusing vitals at this time. PA Lelon MastSamantha made aware.

## 2016-08-14 NOTE — ED Notes (Addendum)
Pt's family, [name], states that he has to go home, but he requests that we call him with any updates regarding the pt (i.e. Being admitted, discharged, etc.).  The phone number to reach him at is [phone].

## 2016-08-14 NOTE — ED Provider Notes (Signed)
[facility]EMERGENCY DEPT Provider Note   CSN: [id number] Arrival date & time: 08/14/16  1200     History   Chief Complaint Chief Complaint  Patient presents with  . Stroke Symptoms    HPI [name] Shipperndrea [name] is a 50 y.o. female.  HPI   Patient is a 50 year old female with history of hypertension, hyperlipidemia, diabetes, CVA and seizures who presents the ED with complaint of left-sided weakness, onset around 8/9am. Patient reports when her husband woke her up this morning to get ready for her appointment at her PCPs office she reported feeling different. Patient states her husband noted she had weakness to the left side of her face and left side of her body. Patient reports having mild left-sided weakness at baseline from her prior stroke but no distress the weakness has worsened this morning. Patient states she was seen at her PCPs office and was sent to the ED via EMS for further evaluation due to left-sided weakness with slurred speech. Patient reports upon arrival to the ED her symptoms completely resolved. Patient currently denies having any pain or complaints at this time. Denies fever, chills, HA, lightheadedness, dizziness, visual changes, CP, SOB, cough, abdominal pain, N/V/D, urinary sxs. Denies any recent fall or injury. Reports she ambulates at home using a walker at baseline. Notes she is currently on antibiotics that she started yesterday for UTI. Reports taking her home medications as prescribed. Denies use of anticoagulants.  Neurologist- Dr. Sherryll BurgerShah ([facility])  Past Medical History:  Diagnosis Date  . Allergic rhinitis   . Chickenpox   . Depression   . Diabetes (HCC)   . Epilepsy (HCC)   . Headache   . HTN (hypertension)   . Hyperlipidemia   . Seizures (HCC)   . Stroke (HCC)   . Urinary incontinence     Patient Active Problem List   Diagnosis Date Noted  . Delirium due to another medical condition 08/13/2016  . Urinary tract infection 08/13/2016  . Right foot infection  05/11/2016  . Foot abscess, right 05/11/2016  . Anxiety and depression 12/02/2015  . CVA (cerebral infarction) 11/27/2015  . Epilepsy (HCC) 06/17/2015  . 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 Headache 05/19/2015  . Weakness of left side of body 05/19/2015  . History of stroke 05/19/2015  . Severe obesity (BMI >= 40) (HCC) 03/19/2014  . HTN (hypertension)   . Diabetes (HCC)   . Hyperlipidemia   . [name] (transient ischemic attack) 03/17/2014    Past Surgical History:  Procedure Laterality Date  . CESAREAN SECTION    . INCISION AND DRAINAGE Right 05/13/2016   Procedure: INCISION AND DRAINAGE;  Surgeon: Gwyneth RevelsJustin Fowler, DPM;  Location: [facility] ORS;  Service: Podiatry;  Laterality: Right;    OB History    No data available       Home Medications    Prior to Admission medications   Medication Sig Start Date End Date Taking? Authorizing Provider  aspirin EC 325 MG EC tablet Take 1 tablet (325 mg total) by mouth daily. 03/19/14   Layne BentonSharon L Biby, NP  butalbital-acetaminophen-caffeine (FIORICET, ESGIC) 50-325-40 MG tablet Take 1-2 tablets by mouth every 6 (six) hours as needed for headache. 07/15/16 07/15/17  Minna AntisKevin Paduchowski, MD  cyclobenzaprine (FLEXERIL) 5 MG tablet Take 1 tablet (5 mg total) by mouth 3 (three) times daily as needed for muscle spasms. 11/28/15   Enid Baasadhika Kalisetti, MD  divalproex (DEPAKOTE ER) 500 MG 24 hr tablet Take 500 mg by mouth daily.    Historical Provider, MD  etodolac (LODINE) 400 MG tablet Take 400 mg by mouth daily as needed.    Historical Provider, MD  glipiZIDE (GLUCOTROL XL) 5 MG 24 hr tablet Take 5 mg by mouth daily.    Historical Provider, MD  insulin aspart (NOVOLOG FLEXPEN) 100 UNIT/ML FlexPen Inject 10 Units into the skin 3 (three) times daily with meals. 05/15/16   Wyatt Hasteavid K Hower, MD  Insulin Detemir (LEVEMIR FLEXPEN) 100 UNIT/ML Pen Inject 30 Units into the skin daily at 10 pm. 05/15/16   Wyatt Hasteavid K Hower, MD  lisinopril-hydrochlorothiazide (PRINZIDE,ZESTORETIC)  20-12.5 MG per tablet Take 1 tablet by mouth daily. 04/19/15   Jene Everyobert Kinner, MD  metFORMIN (GLUCOPHAGE) 500 MG tablet Take 500 mg by mouth 2 (two) times daily with a meal.    Historical Provider, MD  sulfamethoxazole-trimethoprim (BACTRIM DS,SEPTRA DS) 800-160 MG tablet Take 1 tablet by mouth 2 (two) times daily. 08/13/16   Audery AmelJohn T Clapacs, MD  valproic acid (DEPAKENE) 250 MG capsule Take 2 capsules (500 mg total) by mouth 2 (two) times daily. 06/15/15   Milagros LollSrikar Sudini, MD    Family History Family History  Problem Relation Age of Onset  . Hypertension Mother   . Diabetes Mellitus II Mother   . Hypertension Father   . Pancreatic cancer Father     Social History Social History  Substance Use Topics  . Smoking status: Never Smoker  . Smokeless tobacco: Never Used  . Alcohol use No     Allergies   Patient has no known allergies.   Review of Systems Review of Systems  Neurological: Positive for weakness.  All other systems reviewed and are negative.    Physical Exam Updated Vital Signs BP 124/92   Pulse 95   Temp 97.8 F (36.6 C) (Oral)   Resp 17   Ht 5\' 2"  (1.575 m)   Wt 106.6 kg   LMP 07/17/2016 (Approximate)   SpO2 100%   BMI 42.98 kg/m   Physical Exam  Constitutional: She is oriented to person, place, and time. She appears well-developed and well-nourished. No distress.  Morbidly obese female  HENT:  Head: Normocephalic and atraumatic.  Mouth/Throat: Oropharynx is clear and moist. No oropharyngeal exudate.  Eyes: Conjunctivae and EOM are normal. Pupils are equal, round, and reactive to light. Right eye exhibits no discharge. Left eye exhibits no discharge. No scleral icterus.  Neck: Normal range of motion. Neck supple.  Cardiovascular: Normal rate, regular rhythm, normal heart sounds and intact distal pulses.   Pulmonary/Chest: Effort normal and breath sounds normal. No respiratory distress. She has no wheezes. She has no rales. She exhibits no tenderness.    Abdominal: Soft. Bowel sounds are normal. She exhibits no distension and no mass. There is no tenderness. There is no rebound and no guarding. No hernia.  Musculoskeletal: Normal range of motion. She exhibits no edema or tenderness.  Neurological: She is alert and oriented to person, place, and time. She has normal strength. No cranial nerve deficit or sensory deficit. Coordination normal.  Skin: Skin is warm and dry. She is not diaphoretic.  Nursing note and vitals reviewed.    ED Treatments / Results  Labs (all labs ordered are listed, but only abnormal results are displayed) Labs Reviewed  CBG MONITORING, ED - Abnormal; Notable for the following:       Result Value   Glucose-Capillary 311 (*)    All other components within normal limits  CBC WITH DIFFERENTIAL/PLATELET  COMPREHENSIVE METABOLIC PANEL  ETHANOL  I-STAT TROPOININ, ED  EKG  EKG Interpretation  Date/Time:  Tuesday August 14 2016 12:06:49 EST Ventricular Rate:  89 PR Interval:    QRS Duration: 88 QT Interval:  371 QTC Calculation: 452 R Axis:   21 Text Interpretation:  Sinus rhythm Nonspecific T abnormalities, diffuse leads Confirmed by Denton Lank  MD, Caryn Bee ([id number]) on 08/14/2016 12:30:51 PM       Radiology No results found.  Procedures Procedures (including critical care time)  Medications Ordered in ED Medications - No data to display   Initial Impression / Assessment and Plan / ED Course  I have reviewed the triage vital signs and the nursing notes.  Pertinent labs & imaging results that were available during my care of the patient were reviewed by me and considered in my medical decision making (see chart for details).  Clinical Course     Patient presents with episode of left-sided weakness that occurred around 8-9am this morning when she woke up. Endorses associated slurred speech. Patient reports her symptoms resolved completely on arrival to the ED. History of CVA with residual left-sided  weakness, endorses using a walker at baseline for ambulation. VSS. Exam unremarkable. No neuro deficits. Labs unremarkable. She head showed extensive chronic ischemic changes in the right MCA territory; ectatic vasculature versus aneurysm in the right internal carotid Artery, recommend CTA for further evaluation.  Consulted pt's neurologist at [facility] I spoke with neurologist on call, Dr. [place] Mayberry who advised to order MRI brain and MRA head for further evaluation of stroke due to pt's history. Suspect [name] vs recrudescence. Dr. Iran Planas reports he will message Dr. Sherryll Burger to try to set-up outpatient appointment for pt. Discussed results and plan with pt.   Hand-off to [employer], PA-C. MRI/MRA pending. Dispo pending imaging. If imaging is unremarkable, plan to d/c pt home with plan of continuing statin and ASA and to follow up with her neurologoist, Dr. Sherryll Burger, outpatient.  Final Clinical Impressions(s) / ED Diagnoses   Final diagnoses:  None    New Prescriptions New Prescriptions   No medications on file     Barrett Henle, PA-C 08/14/16 1538    Cathren Laine, MD 08/15/16 [phone]

## 2016-08-14 NOTE — ED Notes (Signed)
Pt returned from MRI °

## 2016-08-14 NOTE — ED Notes (Signed)
Pt admitted for multiple strokes found on MRI. Pt is at times confused but generally Aox4 and ambulatory. Pt repeatedly disconnecting self from monitor and leaving room. Pt unable to fully follow commands. Pt has hx of CVA. No other complaints stated by pt.

## 2016-08-14 NOTE — ED Notes (Signed)
Assisted Sarah, RN with placing the pt on a bedpan.  Pericare was performed upon removal of the bedpan.

## 2016-08-14 NOTE — ED Notes (Signed)
Pt found sitting in chair disconnected from monitor. Pt moved back to bed and reconnected. Pt requesting to go home.

## 2016-08-14 NOTE — H&P (Signed)
History and Physical    [name] ZOX:[id number] DOB: September 25, 1965 DOA: 08/14/2016  PCP: [facility]  Neurologist is at [facility]  Patient coming from: Home  Chief Complaint: Slurred speech and facial droop  HPI: [name] is a 50 y.o. woman with a history of CVA, HTN, HLD, DM, seizure disorder, and depression who presented to the ED for evaluation of slurred speech and left facial droop.  She also reported some left leg weakness.  She denies headache, nausea, vomiting.  No history of cardiac arrhythmia.  No chest pain or shortness of breath.  ED Course: MRI shows acute nonhemorrhagic left paracentral pontine infarct, acute/subacute infarct of the anterior margin of the posterior limb of the left internal capsule, and a large, remote R MCA distribution infarct with encephalomalacia.  Of note, she also has diffuse disease on her MRA.  Neurology consulted.  Hospitalist asked to admit.  Review of Systems: As per HPI otherwise 10 point review of systems negative.    Past Medical History:  Diagnosis Date  . Allergic rhinitis   . Chickenpox   . Depression   . Diabetes (HCC)   . Epilepsy (HCC)   . Headache   . HTN (hypertension)   . Hyperlipidemia   . Seizures (HCC)   . Stroke (HCC)   . Urinary incontinence     Past Surgical History:  Procedure Laterality Date  . CESAREAN SECTION    . INCISION AND DRAINAGE Right 05/13/2016   Procedure: INCISION AND DRAINAGE;  Surgeon: Gwyneth Revels, DPM;  Location: [facility] ORS;  Service: Podiatry;  Laterality: Right;     reports that she has never smoked. She has never used smokeless tobacco. She reports that she does not drink alcohol or use drugs.  No Known Allergies  Family History  Problem Relation Age of Onset  . Hypertension Mother   . Diabetes Mellitus II Mother   . Hypertension Father   . Pancreatic cancer Father      Prior to Admission medications   Medication Sig Start Date End Date Taking? Authorizing Provider  aspirin EC  325 MG EC tablet Take 1 tablet (325 mg total) by mouth daily. 03/19/14  Yes Layne Benton, NP  butalbital-acetaminophen-caffeine (FIORICET, ESGIC) 50-325-40 MG tablet Take 1-2 tablets by mouth every 6 (six) hours as needed for headache. 07/15/16 07/15/17 Yes Minna Antis, MD  cyclobenzaprine (FLEXERIL) 5 MG tablet Take 1 tablet (5 mg total) by mouth 3 (three) times daily as needed for muscle spasms. 11/28/15  Yes Enid Baas, MD  etodolac (LODINE) 400 MG tablet Take 400 mg by mouth daily as needed.   Yes Historical Provider, MD  glipiZIDE (GLUCOTROL XL) 5 MG 24 hr tablet Take 5 mg by mouth daily.   Yes Historical Provider, MD  insulin aspart (NOVOLOG FLEXPEN) 100 UNIT/ML FlexPen Inject 10 Units into the skin 3 (three) times daily with meals. 05/15/16  Yes Wyatt Haste, MD  Insulin Detemir (LEVEMIR FLEXPEN) 100 UNIT/ML Pen Inject 30 Units into the skin daily at 10 pm. 05/15/16  Yes Wyatt Haste, MD  lisinopril-hydrochlorothiazide (PRINZIDE,ZESTORETIC) 20-12.5 MG per tablet Take 1 tablet by mouth daily. 04/19/15  Yes Jene Every, MD  metFORMIN (GLUCOPHAGE) 500 MG tablet Take 500 mg by mouth 2 (two) times daily with a meal.   Yes Historical Provider, MD  sulfamethoxazole-trimethoprim (BACTRIM DS,SEPTRA DS) 800-160 MG tablet Take 1 tablet by mouth 2 (two) times daily. 08/13/16  Yes Audery Amel, MD  divalproex (DEPAKOTE ER) 500 MG 24  hr tablet Take 500 mg by mouth daily.    Historical Provider, MD    Physical Exam: Vitals:   08/14/16 1933 08/14/16 1945 08/14/16 2030 08/14/16 2115  BP:  119/80 (!) 149/108 138/82  Pulse: 102 99 100 101  Resp:  22 16   Temp:      TempSrc:      SpO2: 97% 99% 97% 99%  Weight:      Height:          Constitutional: NAD, calm, comfortable Vitals:   08/14/16 1933 08/14/16 1945 08/14/16 2030 08/14/16 2115  BP:  119/80 (!) 149/108 138/82  Pulse: 102 99 100 101  Resp:  22 16   Temp:      TempSrc:      SpO2: 97% 99% 97% 99%  Weight:      Height:        Eyes: PERRL, lids and conjunctivae normal ENMT: Mucous membranes are moist. Posterior pharynx clear of any exudate or lesions. Normal dentition.  Neck: normal appearance, supple Respiratory: clear to auscultation bilaterally, no wheezing, no crackles. Normal respiratory effort. No accessory muscle use.  Cardiovascular: Normal rate, regular rhythm, no murmurs / rubs / gallops. No extremity edema. 2+ pedal pulses. No carotid bruits.  GI: abdomen is obese but soft and compressible.  No distention.  No tenderness.  Bowel sounds are present. Musculoskeletal:  No joint deformity in upper and lower extremities. Good ROM, no contractures. Normal muscle tone.  Skin: no rashes, warm and dry Neurologic: She still has mild slurred speech for me but facial asymmetry has resolved.  CN grossly intact.  No significant difference in strenght in left sided extremities compared to right for me.  Coordination grossly intact. Psychiatric: Impaired judgement and insight though she is oriented x 3.  Flat affect.       Labs on Admission: I have personally reviewed following labs and imaging studies  CBC:  Recent Labs Lab 08/14/16 1244  WBC 5.8  NEUTROABS 3.6  HGB 13.2  HCT 40.6  MCV 61.1*  PLT 149*   Basic Metabolic Panel:  Recent Labs Lab 08/12/16 1351 08/14/16 1244  NA 137 136  K 4.0 4.3  CL 102 103  CO2 26 23  GLUCOSE 306* 311*  BUN 12 16  CREATININE 0.97 0.88  CALCIUM 8.9 9.6   GFR: Estimated Creatinine Clearance: 87.8 mL/min (by C-G formula based on SCr of 0.88 mg/dL). Liver Function Tests:  Recent Labs Lab 08/12/16 1351 08/14/16 1244  AST 14* 18  ALT 15 14  ALKPHOS 53 52  BILITOT 0.7 0.3  PROT 7.5 6.8  ALBUMIN 3.8 3.9   CBG:  Recent Labs Lab 08/12/16 1823 08/12/16 2028 08/13/16 0517 08/13/16 1141 08/14/16 1214  GLUCAP 267* 355* 293* 282* 311*   Radiological Exams on Admission: Ct Head Wo Contrast  Result Date: 08/14/2016 CLINICAL DATA:  Left-sided weakness  and slurred speech. EXAM: CT HEAD WITHOUT CONTRAST TECHNIQUE: Contiguous axial images were obtained from the base of the skull through the vertex without intravenous contrast. COMPARISON:  None. FINDINGS: Brain: There is extensive encephalomalacia in the right parietal and anterior temporal lobes. There is ex vacuo dilatation of the right lateral ventricle. There are chronic ischemic changes in the periventricular white matter. There is also global atrophy. There is no mass effect or midline shift. No evidence of hemorrhage. Vascular: There is a calcified soft tissue mass in the region of the right carotid terminus. Aneurysm is not excluded. This may simply represent ectatic vasculature  and atherosclerotic calcification. Skull: Cranium is intact. Sinuses/Orbits: Minimal mucus material layers in the right maxillary sinus. Remainder the visualized paranasal sinuses are clear. Mastoid air cells are clear. Other: None. IMPRESSION: Extensive chronic ischemic changes in the right MCA territory. Ectatic vasculature versus aneurysm in the right internal carotid artery. CT angiogram may be helpful. Electronically Signed   By: Jolaine ClickArthur  Hoss M.D.   On: 08/14/2016 13:14   Mr Maxine GlennMra Head Wo Contrast  Result Date: 08/14/2016 CLINICAL DATA:  50 year old hypertensive diabetic female with hyperlipidemia and history of infarct and seizures presenting with left-sided weakness. Subsequent encounter. EXAM: MRI HEAD WITHOUT CONTRAST MRA HEAD WITHOUT CONTRAST TECHNIQUE: Multiplanar, multiecho pulse sequences of the brain and surrounding structures were obtained without intravenous contrast. Angiographic images of the head were obtained using MRA technique without contrast. COMPARISON:  08/14/2016 head CT.  No comparison brain MR. FINDINGS: MRI HEAD FINDINGS Exam is motion degraded. Brain: Acute nonhemorrhagic left paracentral pontine infarct. Acute/ subacute infarct anterior margin of the posterior limb of the left internal capsule.  Remote large right middle cerebral artery distribution infarct with encephalomalacia right temporal lobe, right parietal lobe and posterior right frontal lobe with subsequent dilation of the right lateral ventricle. Wallerian degeneration of the right cerebral peduncle. Mild chronic microvascular changes. Global atrophy without hydrocephalus. Punctate blood breakdown products left lenticular nucleus left thalamus consistent prior episodes hemorrhagic ischemia. Pituitary gland top-normal size measuring up to 7.8 mm with slightly convex superior margin without suprasellar extension. Vascular: As below. Skull and upper cervical spine: No acute abnormality. Sinuses/Orbits: Mucosal thickening inferior maxillary sinuses measuring up to 7 mm on the left and 3 mm on the right. No acute orbital abnormality. Other: Negative MRA HEAD FINDINGS Moderate narrowing and irregularity cavernous segment right internal carotid artery. Moderate tandem stenosis M1 segment right middle cerebral artery. Decrease number of visualized right middle cerebral artery branches consistent with patient's right middle cerebral artery distribution infarct. Vessels which are visualized appear narrowed and irregular. Moderate to marked narrowing proximal A1 segment right anterior cerebral artery. Moderate tandem stenosis A1 segment left anterior cerebral artery. Moderate narrowing mid aspect M1 segment left middle cerebral artery. Left middle cerebral artery moderate branch vessel narrowing and irregularity. Right vertebral artery ends in a narrowed irregular right posterior inferior cerebellar artery distribution. Ectatic left vertebral artery mild narrowing distally. Nonvisualized left posterior inferior cerebellar artery. Mild irregularity in ectasia basilar artery without high-grade stenosis. Nonvisualized right anterior inferior cerebellar artery. Posterior cerebral artery distal branch vessel mild moderate narrowing greater on the right. No  aneurysm noted. IMPRESSION: MRI HEAD Exam is motion degraded. Acute nonhemorrhagic left paracentral pontine infarct. Acute/ subacute infarct anterior margin of the posterior limb of the left internal capsule. 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 Mild chronic microvascular changes. Global atrophy. Punctate blood breakdown products left lenticular nucleus left thalamus consistent prior episodes of hemorrhagic ischemia. Pituitary gland top-normal size. Mucosal thickening inferior maxillary sinuses measuring up to 7 mm on the left and 3 mm on the right. MRA HEAD Moderate narrowing and irregularity cavernous segment right internal carotid artery. Moderate tandem stenosis M1 segment right middle cerebral artery. Decrease number of visualized right middle cerebral artery branches consistent with patient's right middle cerebral artery distribution infarct. Vessels which are visualized appear narrowed and irregular. Moderate to marked narrowing proximal A1 segment right anterior cerebral artery. Moderate tandem stenosis A1 segment left anterior cerebral artery. Moderate narrowing mid aspect M1 segment left middle cerebral artery. Left middle cerebral artery  moderate branch vessel narrowing and irregularity. Right vertebral artery ends in a narrowed irregular right posterior inferior cerebellar artery distribution. Ectatic left vertebral artery mild narrowing distally. Nonvisualized left posterior inferior cerebellar artery. Mild irregularity and ectasia basilar artery without high-grade stenosis. Nonvisualized right anterior inferior cerebellar artery. Posterior cerebral artery distal branch vessel mild moderate narrowing greater on the right. No aneurysm noted. Electronically Signed   By: Lacy Duverney M.D.   On: 08/14/2016 18:55   Mr Brain Wo Contrast  Result Date: 08/14/2016 CLINICAL DATA:  50 year old hypertensive diabetic female with hyperlipidemia and history of  infarct and seizures presenting with left-sided weakness. Subsequent encounter. EXAM: MRI HEAD WITHOUT CONTRAST MRA HEAD WITHOUT CONTRAST TECHNIQUE: Multiplanar, multiecho pulse sequences of the brain and surrounding structures were obtained without intravenous contrast. Angiographic images of the head were obtained using MRA technique without contrast. COMPARISON:  08/14/2016 head CT.  No comparison brain MR. FINDINGS: MRI HEAD FINDINGS Exam is motion degraded. Brain: Acute nonhemorrhagic left paracentral pontine infarct. Acute/ subacute infarct anterior margin of the posterior limb of the left internal capsule. Remote large right middle cerebral artery distribution infarct with encephalomalacia right temporal lobe, right parietal lobe and posterior right frontal lobe with subsequent dilation of the right lateral ventricle. Wallerian degeneration of the right cerebral peduncle. Mild chronic microvascular changes. Global atrophy without hydrocephalus. Punctate blood breakdown products left lenticular nucleus left thalamus consistent prior episodes hemorrhagic ischemia. Pituitary gland top-normal size measuring up to 7.8 mm with slightly convex superior margin without suprasellar extension. Vascular: As below. Skull and upper cervical spine: No acute abnormality. Sinuses/Orbits: Mucosal thickening inferior maxillary sinuses measuring up to 7 mm on the left and 3 mm on the right. No acute orbital abnormality. Other: Negative MRA HEAD FINDINGS Moderate narrowing and irregularity cavernous segment right internal carotid artery. Moderate tandem stenosis M1 segment right middle cerebral artery. Decrease number of visualized right middle cerebral artery branches consistent with patient's right middle cerebral artery distribution infarct. Vessels which are visualized appear narrowed and irregular. Moderate to marked narrowing proximal A1 segment right anterior cerebral artery. Moderate tandem stenosis A1 segment left  anterior cerebral artery. Moderate narrowing mid aspect M1 segment left middle cerebral artery. Left middle cerebral artery moderate branch vessel narrowing and irregularity. Right vertebral artery ends in a narrowed irregular right posterior inferior cerebellar artery distribution. Ectatic left vertebral artery mild narrowing distally. Nonvisualized left posterior inferior cerebellar artery. Mild irregularity in ectasia basilar artery without high-grade stenosis. Nonvisualized right anterior inferior cerebellar artery. Posterior cerebral artery distal branch vessel mild moderate narrowing greater on the right. No aneurysm noted. IMPRESSION: MRI HEAD Exam is motion degraded. Acute nonhemorrhagic left paracentral pontine infarct. Acute/ subacute infarct anterior margin of the posterior limb of the left internal capsule. 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 Mild chronic microvascular changes. Global atrophy. Punctate blood breakdown products left lenticular nucleus left thalamus consistent prior episodes of hemorrhagic ischemia. Pituitary gland top-normal size. Mucosal thickening inferior maxillary sinuses measuring up to 7 mm on the left and 3 mm on the right. MRA HEAD Moderate narrowing and irregularity cavernous segment right internal carotid artery. Moderate tandem stenosis M1 segment right middle cerebral artery. Decrease number of visualized right middle cerebral artery branches consistent with patient's right middle cerebral artery distribution infarct. Vessels which are visualized appear narrowed and irregular. Moderate to marked narrowing proximal A1 segment right anterior cerebral artery. Moderate tandem stenosis A1 segment left anterior cerebral artery. Moderate narrowing mid aspect M1 segment left  middle cerebral artery. Left middle cerebral artery moderate branch vessel narrowing and irregularity. Right vertebral artery ends in a narrowed irregular right  posterior inferior cerebellar artery distribution. Ectatic left vertebral artery mild narrowing distally. Nonvisualized left posterior inferior cerebellar artery. Mild irregularity and ectasia basilar artery without high-grade stenosis. Nonvisualized right anterior inferior cerebellar artery. Posterior cerebral artery distal branch vessel mild moderate narrowing greater on the right. No aneurysm noted. Electronically Signed   By: Lacy DuverneySteven  Olson M.D.   On: 08/14/2016 18:55    EKG: Independently reviewed. NSR.  No acute ST segment changes.  Diffuse T wave inversions.  Assessment/Plan Principal Problem:   Cerebral infarction [facility](HCC) Active Problems:   HTN (hypertension)   Diabetes (HCC)   Hyperlipidemia   Severe obesity (BMI >= 40) (HCC)   Epilepsy (HCC)   Acute CVA (cerebrovascular accident) (HCC)      Acute CVA --Neurology consult pending --Continue full strength aspirin for now --Carotid dopplers, echo, lipids per protocol --PT/OT/Speech eval and treat  DM --Check A1c --Continue home dose of levemir --SSI coverage AC/HS  History of seizure --Continue depakote  Severe obesity, BMI greater than 40, present on admssion  HTN --Continue ACE-I, HCTZ  Abnormal U/A concerning for UTI [facility] ED [facility] --HOLD bactrim --IV Rocephin now --Repeat U/A and culture  [facility] for "abnormal behaviors" --Consider psych evaluation with this admission   DVT prophylaxis: SCDs Code Status: FULL Family Communication: Patient alone in the ED at time of admission. Disposition Plan: To be determined. Consults called: Neurology Admission status: Inpatient with telemetry monitoring   TIME SPENT: 60 minutes   Jerene Bearsarter,Ezeriah Luty Harrison MD [facility] Pager [phone]  If 7PM-7AM, please contact night-coverage [email or web address] Password [email or web address]  08/14/2016, 9:38 PM

## 2016-08-14 NOTE — ED Triage Notes (Signed)
Pt has hx of  CVA and [name]. Pt's husband found pt this morning with L sided weakness, slurred speech, L sided facial droop. Pt went to bed normal.Took pt to PCP to be evaluated. EMS was called. EMS noticed slurred speech and lethargy, no gross neuro deficits per EMS.  Pt recently diagnosed with UTI, but has not started antibiotics. BP 142/90, HR 92-102 sinus rhythm, CBG 392.

## 2016-08-15 ENCOUNTER — Encounter (HOSPITAL_COMMUNITY): Payer: BLUE CROSS/BLUE SHIELD

## 2016-08-15 DIAGNOSIS — I6300 Cerebral infarction due to thrombosis of unspecified precerebral artery: Secondary | ICD-10-CM

## 2016-08-15 DIAGNOSIS — I639 Cerebral infarction, unspecified: Secondary | ICD-10-CM

## 2016-08-15 LAB — LIPID PANEL
CHOL/HDL RATIO: 4.4 ratio
CHOLESTEROL: 189 mg/dL (ref 0–200)
HDL: 43 mg/dL (ref >40)
LDL CALC: 121 mg/dL — AB (ref 0–99)
TRIGLYCERIDES: 125 mg/dL (ref <150)
VLDL: 25 mg/dL (ref 0–40)

## 2016-08-15 LAB — URINALYSIS, ROUTINE W REFLEX MICROSCOPIC
Glucose, UA: NEGATIVE mg/dL
Hgb urine dipstick: NEGATIVE
KETONES UR: 40 mg/dL — AB
LEUKOCYTES UA: NEGATIVE
NITRITE: NEGATIVE
PROTEIN: NEGATIVE mg/dL
Specific Gravity, Urine: 1.025 (ref 1.005–1.030)
pH: 5.5 (ref 5.0–8.0)

## 2016-08-15 LAB — GLUCOSE, CAPILLARY
Glucose-Capillary: 207 mg/dL — ABNORMAL HIGH (ref 65–99)
Glucose-Capillary: 307 mg/dL — ABNORMAL HIGH (ref 65–99)
Glucose-Capillary: 341 mg/dL — ABNORMAL HIGH (ref 65–99)

## 2016-08-15 MED ORDER — INSULIN DETEMIR 100 UNIT/ML ~~LOC~~ SOLN
35.0000 [IU] | Freq: Every day | SUBCUTANEOUS | Status: DC
  Administered 2016-08-15: 35 [IU] via SUBCUTANEOUS
  Filled 2016-08-15 (×2): qty 0.35

## 2016-08-15 MED ORDER — ENOXAPARIN SODIUM 40 MG/0.4ML ~~LOC~~ SOLN
40.0000 mg | SUBCUTANEOUS | Status: DC
  Administered 2016-08-15 – 2016-08-16 (×2): 40 mg via SUBCUTANEOUS
  Filled 2016-08-15 (×2): qty 0.4

## 2016-08-15 MED ORDER — ATORVASTATIN CALCIUM 40 MG PO TABS
40.0000 mg | ORAL_TABLET | Freq: Every day | ORAL | Status: DC
  Administered 2016-08-15 – 2016-08-16 (×2): 40 mg via ORAL
  Filled 2016-08-15 (×2): qty 1

## 2016-08-15 MED ORDER — INSULIN ASPART 100 UNIT/ML ~~LOC~~ SOLN: 6.0000 [IU] | Freq: Three times a day (TID) | SUBCUTANEOUS | Status: DC

## 2016-08-15 MED ORDER — DEXTROSE 5 % IV SOLN
1.0000 g | Freq: Every day | INTRAVENOUS | Status: DC
  Administered 2016-08-15 (×2): 1 g via INTRAVENOUS
  Filled 2016-08-15 (×4): qty 10

## 2016-08-15 NOTE — Evaluation (Signed)
Speech Language Pathology Evaluation Patient Details Name: [name] MRN: [id number] DOB: 07/25/1966 Today's Date: 08/15/2016 Time: 4098-11910955-1025 SLP Time Calculation (min) (ACUTE ONLY): 30 min  Problem List:  Patient Active Problem List   Diagnosis Date Noted  . Cerebral infarction (HCC) 08/14/2016  . Acute CVA (cerebrovascular accident) (HCC) 08/14/2016  . Delirium due to another medical condition 08/13/2016  . Urinary tract infection 08/13/2016  . Right foot infection 05/11/2016  . Foot abscess, right 05/11/2016  . Anxiety and depression 12/02/2015  . CVA (cerebral infarction) 11/27/2015  . Epilepsy (HCC) 06/17/2015  . 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 Headache 05/19/2015  . Weakness of left side of body 05/19/2015  . History of stroke 05/19/2015  . Severe obesity (BMI >= 40) (HCC) 03/19/2014  . HTN (hypertension)   . Diabetes (HCC)   . Hyperlipidemia   . [name] (transient ischemic attack) 03/17/2014   Past Medical History:  Past Medical History:  Diagnosis Date  . Allergic rhinitis   . Chickenpox   . Depression   . Diabetes (HCC)   . Epilepsy (HCC)   . Headache   . HTN (hypertension)   . Hyperlipidemia   . Seizures (HCC)   . Stroke (HCC)   . Urinary incontinence    Past Surgical History:  Past Surgical History:  Procedure Laterality Date  . CESAREAN SECTION    . INCISION AND DRAINAGE Right 05/13/2016   Procedure: INCISION AND DRAINAGE;  Surgeon: Gwyneth RevelsJustin Fowler, DPM;  Location: [facility] ORS;  Service: Podiatry;  Laterality: Right;   HPI:  50 y/o patient presented to ER from home with IVC papers via [employer]. Patient states she is here "to get checked out because of having headaches". Patient's IVC papers state patient has h/o cutting self and SI attempt by overdose; that patient has been threatening husband and has been wandering off in the middle of the night outside. Patient has also been urinating on self and has seemed "out of it". Pt with history of hypertension, diabetes  mellitus, hyperlipidemia, previous stroke (11/2015) and seizure disorder, presenting with new onset left-sided weakness. MRI revealed acute nonhemorrhagic left paracentral pontine infarct. 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 Global atrophy. Mild chronic microvascular changes.    Assessment / Plan / Recommendation Clinical Impression  Pt presents with mild to moderate cognitive-linguistic deficits c/b deficits in short term memory, semi-complex problem solving, sustained attention, awareness, initiation and self-monitoring. These deficits impact pt's higher level cognitive function. Pt also presents with decreased intelligibility c/b imprecise articulation and low vocal intensity resulting in ~25% intelligibility at word level and is largely unintelligible at the phrase and sentence level. Pt is unaware. Pt's husband present and confirms the above as acute deficits related to this CVA. Given the above deficits, pt will likely need 24 hours supervision upon discharge and would benefit from either HHST or outpatient ST. Education provided to pt and husband on deficits and recommendations. ST to follow during this hospitalization.     SLP Assessment  Patient needs continued Speech Lanaguage Pathology Services    Follow Up Recommendations  Home health SLP;Outpatient SLP    Frequency and Duration min 2x/week  2 weeks      SLP Evaluation Cognition  Overall Cognitive Status: Impaired/Different from baseline Arousal/Alertness: Awake/alert Orientation Level: Oriented X4 Attention: Sustained Sustained Attention: Impaired Sustained Attention Impairment: Verbal complex;Functional complex Memory: Impaired Memory Impairment: Decreased short term memory;Decreased recall of new information Decreased Short Term Memory: Verbal complex;Functional complex Awareness: Impaired Awareness Impairment: Intellectual  impairment Problem Solving: Impaired Problem  Solving Impairment: Verbal complex;Functional complex Executive Function: Reasoning;Decision Making;Initiating;Self Monitoring;Self Correcting;Organizing Reasoning: Impaired Reasoning Impairment: Verbal complex;Functional complex Organizing: Impaired Organizing Impairment: Verbal complex;Functional complex Decision Making: Impaired Decision Making Impairment: Verbal basic;Functional basic Initiating: Impaired Initiating Impairment: Verbal basic;Functional basic Self Monitoring: Impaired Self Monitoring Impairment: Verbal basic;Functional basic Self Correcting: Impaired Self Correcting Impairment: Verbal complex;Functional complex Behaviors: Restless Safety/Judgment: Impaired       Comprehension  Auditory Comprehension Overall Auditory Comprehension: Appears within functional limits for tasks assessed Yes/No Questions: Within Functional Limits Commands: Within Functional Limits Conversation: Simple Visual Recognition/Discrimination Discrimination: Not tested Reading Comprehension Reading Status: Not tested    Expression Expression Primary Mode of Expression: Verbal Verbal Expression Overall Verbal Expression: Impaired Initiation: Impaired Automatic Speech: Social Response Level of Generative/Spontaneous Verbalization: Sentence Repetition: No impairment Naming: No impairment Pragmatics: Impairment Impairments: Abnormal affect;Dysprosody;Eye contact;Topic maintenance Interfering Components: Attention;Speech intelligibility Effective Techniques: Open ended questions Non-Verbal Means of Communication: Not applicable Written Expression Dominant Hand: Right Written Expression: Within Functional Limits   Oral / Motor  Oral Motor/Sensory Function Overall Oral Motor/Sensory Function: Mild impairment Facial ROM: Reduced left (Mild, functional) Facial Symmetry: Abnormal symmetry left Facial Strength: Within Functional Limits Facial Sensation: Within Functional Limits Lingual  ROM: Within Functional Limits Lingual Symmetry: Within Functional Limits Lingual Strength: Within Functional Limits Lingual Sensation: Within Functional Limits Velum: Within Functional Limits Mandible: Within Functional Limits Motor Speech Overall Motor Speech: Appears within functional limits for tasks assessed Respiration: Within functional limits Phonation: Low vocal intensity Resonance: Within functional limits Articulation: Impaired Level of Impairment: Phrase Intelligibility: Intelligibility reduced Word: 75-100% accurate Phrase: 25-49% accurate Sentence: 25-49% accurate Conversation: 25-49% accurate Motor Planning: Witnin functional limits Motor Speech Errors: Not applicable Effective Techniques: Increased vocal intensity;Over-articulate   GO                   Aries Kasa B. Dreama Saaverton, M.S., CCC-SLP Speech-Language Pathologist [phone] Kazaria Gaertner Dreama SaaOverton 08/15/2016, 10:39 AM

## 2016-08-15 NOTE — Evaluation (Signed)
Occupational Therapy Evaluation Patient Details Name: [name] MRN: [id number] DOB: 09/04/1966 Today's Date: 08/15/2016    History of Present Illness 50 y.o. female admitted with L side weakness. 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  PMH of DM, HTN, CVA, legally blind, seizures.    Clinical Impression   No family available during assessment to confirm pt's PLOF and cognitive level. Pt reports walking with a RW and performing ADL at a modified independent level. She is dependent in IADL. Pt somewhat guarded during session, so somewhat limited evaluation. Requiring min to moderate assistance with sit to stand and min guard assist to ambulate to bathroom and sink.Will follow acutely.    Follow Up Recommendations  Home health OT;Supervision/Assistance - 24 hour    Equipment Recommendations  3 in 1 bedside comode    Recommendations for Other Services       Precautions / Restrictions Precautions Precautions: Fall Precaution Comments: pt denies h/o falls Restrictions Weight Bearing Restrictions: No      Mobility Bed Mobility Overal bed mobility: Modified Independent             General bed mobility comments: side<>sit on couch, increased time  Transfers Overall transfer level: Needs assistance Equipment used: Rolling walker (2 wheeled) Transfers: Sit to/from UGI CorporationStand;Stand Pivot Transfers Sit to Stand: Mod assist;Min assist Stand pivot transfers: Min guard       General transfer comment: mod from couch, min from 3 in 1    Balance Overall balance assessment: Modified Independent                                          ADL Overall ADL's : Needs assistance/impaired Eating/Feeding: Independent;Sitting   Grooming: Wash/dry hands;Standing;Min guard Grooming Details (indicate cue type and reason): cues to locate soap and paper towels         Upper Body Dressing : Minimal assistance;Sitting Upper Body Dressing  Details (indicate cue type and reason): front opening gown Lower Body Dressing: Moderate assistance;Sit to/from stand Lower Body Dressing Details (indicate cue type and reason): able to pull up socks Toilet Transfer: Minimal assistance;Stand-pivot;RW Toilet Transfer Details (indicate cue type and reason): min assist to stand from 3 in 1 Toileting- Clothing Manipulation and Hygiene: Supervision/safety;Sitting/lateral lean       Functional mobility during ADLs: Min guard;Rolling walker General ADL Comments: explained benefits of 3 in 1 as pt has a difficult time standing from low surfaces, pt in agreement she needs one at home.     Vision Vision Assessment?: Vision impaired- to be further tested in functional context Additional Comments: eyes are dysconjugate, per chart pt is legally blind, not formally assessed   Perception     Praxis      Pertinent Vitals/Pain Pain Assessment: No/denies pain Pain Score: 5  Pain Location: L calf with walking, tender to palpation distal gastroc Pain Descriptors / Indicators: Sore Pain Intervention(s): Monitored during session;Limited activity within patient's tolerance (RN notified, ? concern for DVT)     Hand Dominance Right   Extremity/Trunk Assessment Upper Extremity Assessment Upper Extremity Assessment:  (not formally assessed, WFL for RW and limited ADL)   Lower Extremity Assessment Lower Extremity Assessment: Defer to PT evaluation LLE Deficits / Details: prior to walking pt was painfree, with walking she reported new onset of L calf pain of 5/10 intensity. Tender to palpation distal gastroc, no  pain with passive ankle DF no warmth detected, RN notified.    Cervical / Trunk Assessment Cervical / Trunk Assessment: Normal   Communication Communication Communication: Expressive difficulties (slightly slurred speech)   Cognition Arousal/Alertness: Awake/alert;Lethargic (closes her eyes when not stimulated) Behavior During Therapy: Flat  affect Overall Cognitive Status: No family/caregiver present to determine baseline cognitive functioning Area of Impairment: Safety/judgement;Problem solving         Safety/Judgement: Decreased awareness of deficits   Problem Solving: Slow processing General Comments: pt laying on couch without cushion, declined assist up to chair or back to bed, NT aware   General Comments       Exercises       Shoulder Instructions      Home Living Family/patient expects to be discharged to:: Private residence Living Arrangements: Spouse/significant other;Children Available Help at Discharge: Family;Available PRN/intermittently Type of Home: House Home Access: Stairs to enter Entrance Stairs-Number of Steps: 4 Entrance Stairs-Rails: Can reach both;Left;Right Home Layout: Able to live on main level with bedroom/bathroom;Two level     Bathroom Shower/Tub: Chief Strategy OfficerTub/shower unit   Bathroom Toilet: Standard     Home Equipment: Environmental consultantWalker - 2 wheels;Cane - single point;Shower seat;Hand held shower head      Lives With: Spouse;Daughter (daughters are [id number] and [id number])    Prior Functioning/Environment Level of Independence: Needs assistance  Gait / Transfers Assistance Needed: walks with RW ADL's / Homemaking Assistance Needed: reports performing her own self care, sits to shower, daughters and husband perform all IADL   Comments: does not drive        OT Problem List: Decreased strength;Decreased activity tolerance;Impaired balance (sitting and/or standing);Impaired vision/perception;Decreased cognition;Decreased safety awareness;Decreased knowledge of use of DME or AE;Obesity   OT Treatment/Interventions: Self-care/ADL training;DME and/or AE instruction;Therapeutic activities;Cognitive remediation/compensation;Patient/family education;Balance training    OT Goals(Current goals can be found in the care plan section) Acute Rehab OT Goals Patient Stated Goal: return home OT Goal Formulation: With  patient Potential to Achieve Goals: Good ADL Goals Pt Will Perform Grooming: (P) with supervision;standing Pt Will Perform Upper Body Dressing: (P) with supervision;sitting Pt Will Perform Lower Body Dressing: (P) with supervision;sit to/from stand Pt Will Transfer to Toilet: (P) with supervision;ambulating;bedside commode (over toilet) Pt Will Perform Toileting - Clothing Manipulation and hygiene: (P) with supervision;sit to/from stand Pt Will Perform Tub/Shower Transfer: (P) Tub transfer;with supervision;ambulating;shower seat;rolling walker  OT Frequency: Min 2X/week   Barriers to D/C:            Co-evaluation              End of Session Equipment Utilized During Treatment: Gait belt;Rolling walker Nurse Communication:  (aware pt is seated on the couch)  Activity Tolerance: Patient tolerated treatment well Patient left: Other (comment) (couch)   Time: [id number] OT Time Calculation (min): 14 min Charges:  OT General Charges $OT Visit: 1 Procedure OT Evaluation $OT Eval Moderate Complexity: 1 Procedure G-Codes:    Evern BioMayberry, Darcia Lampi Lynn 08/15/2016, 2:52 PM  [phone]

## 2016-08-15 NOTE — Progress Notes (Addendum)
Physical Therapy Evaluation Patient Details Name: [name] MRN: [id number] DOB: 09/20/1965 Today's Date: 08/15/2016   History of Present Illness  50 y.o. female admitted with L hemiparesis. 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  PMH of DM, HTN, CVA, legally blind, seizures.   Clinical Impression  Pt admitted with above diagnosis. Pt currently with functional limitations due to the deficits listed below (see PT Problem List). Pt ambulated 5545' with RW, distance limited by new onset of L calf pain. Tender to palpation L distal gastroc, no pain with passive ankle dorsiflexion, no warmth noted, RN notified. She reports blurry vision.  Pt will benefit from skilled PT to increase their independence and safety with mobility to allow discharge to the venue listed below.       Follow Up Recommendations Home health PT    Equipment Recommendations  None recommended by PT    Recommendations for Other Services       Precautions / Restrictions Precautions Precautions: Fall Precaution Comments: pt denies h/o falls Restrictions Weight Bearing Restrictions: No      Mobility  Bed Mobility               General bed mobility comments: up on couch  Transfers Overall transfer level: Needs assistance Equipment used: Rolling walker (2 wheeled) Transfers: Sit to/from Stand Sit to Stand: Mod assist         General transfer comment: assist to rise from low surface  Ambulation/Gait Ambulation/Gait assistance: Min guard Ambulation Distance (Feet): 45 Feet Assistive device: Rolling walker (2 wheeled) Gait Pattern/deviations: Step-through pattern   Gait velocity interpretation: Below normal speed for age/gender General Gait Details: distance limited by new onset of L calf pain with walking, RN notified, pt declined pain meds and reported decreased pain following seated rest  Stairs            Wheelchair Mobility    Modified Rankin (Stroke  Patients Only)       Balance Overall balance assessment: Modified Independent                                           Pertinent Vitals/Pain Pain Assessment: 0-10 Pain Score: 5  Pain Location: L calf with walking, tender to palpation distal gastroc Pain Descriptors / Indicators: Sore Pain Intervention(s): Monitored during session;Limited activity within patient's tolerance (RN notified, ? concern for DVT)    Home Living Family/patient expects to be discharged to:: Private residence Living Arrangements: Spouse/significant other;Children Available Help at Discharge: Family Type of Home: House Home Access: Stairs to enter Entrance Stairs-Rails: Can reach both;Left;Right Entrance Stairs-Number of Steps: 4 Home Layout: Able to live on main level with bedroom/bathroom;Two level Home Equipment: Walker - 2 wheels;Cane - single point      Prior Function Level of Independence: Independent with assistive device(s)         Comments: uses SPC and RW, independent ADLs, doesn't drive     Hand Dominance   Dominant Hand: Right    Extremity/Trunk Assessment   Upper Extremity Assessment: Defer to OT evaluation           Lower Extremity Assessment: Overall WFL for tasks assessed;LLE deficits/detail   LLE Deficits / Details: prior to walking pt was painfree, with walking she reported new onset of L calf pain of 5/10 intensity. Tender to palpation distal gastroc, no pain with passive  ankle DF no warmth detected, RN notified.   Cervical / Trunk Assessment: Normal  Communication   Communication: Expressive difficulties (increased processing time, mildly slurred speech, see SLP eval)  Cognition Arousal/Alertness: Lethargic (somewhat lethargic, responds appropriately to questions with increased time) Behavior During Therapy: Flat affect Overall Cognitive Status: Impaired/Different from baseline Area of Impairment:  (increased processing time)                     General Comments      Exercises     Assessment/Plan    PT Assessment Patient needs continued PT services  PT Problem List Decreased activity tolerance;Pain;Decreased mobility          PT Treatment Interventions Gait training;Functional mobility training;Therapeutic exercise;Therapeutic activities;Patient/family education    PT Goals (Current goals can be found in the Care Plan section)  Acute Rehab PT Goals Patient Stated Goal: return home PT Goal Formulation: With patient Time For Goal Achievement: 08/29/16 Potential to Achieve Goals: Good    Frequency Min 3X/week   Barriers to discharge        Co-evaluation               End of Session Equipment Utilized During Treatment: Gait belt Activity Tolerance: Patient limited by pain Patient left: in chair;with call bell/phone within reach Nurse Communication: Mobility status         Time: [id number] PT Time Calculation (min) (ACUTE ONLY): 16 min   Charges:   PT Evaluation $PT Eval Low Complexity: 1 Procedure     PT G CodesTamala Ser:        [name] 08/15/2016, 1:38 PM [phone]

## 2016-08-15 NOTE — Progress Notes (Signed)
Inpatient Diabetes Program Recommendations  AACE/ADA: New Consensus Statement on Inpatient Glycemic Control (2015)  Target Ranges:  Prepandial:   less than 140 mg/dL      Peak postprandial:   less than 180 mg/dL (1-2 hours)      Critically ill patients:  140 - 180 mg/dL   Lab Results  Component Value Date   GLUCAP 307 (H) 08/15/2016   HGBA1C 13.6 (H) 05/12/2016    Review of Glycemic ControlResults for [name] ShipperLEE, [name] (MRN [id number]) as of 08/15/2016 11:30  Ref. Range 08/13/2016 11:41 08/14/2016 12:14 08/14/2016 23:43 08/15/2016 06:25 08/15/2016 11:02  Glucose-Capillary Latest Ref Range: 65 - 99 mg/dL 956282 (H) 213311 (H) 086280 (H) 207 (H) 307 (H)    Diabetes history: Type 2 diabetes Outpatient Diabetes medications: Levemir 30 units q HS, Novolog 10 units tid with meals Current orders for Inpatient glycemic control:  Novolog resistant tid with meals and HS, Levemir 30 units q HS  Inpatient Diabetes Program Recommendations:   A1C pending.  Please add Novolog meal coverage 6 units tid with meals.   Thanks, Beryl MeagerJenny Airiel Oblinger, RN, BC-ADM Inpatient Diabetes Coordinator Pager [phone] (8a-5p)

## 2016-08-15 NOTE — Progress Notes (Signed)
PROGRESS NOTE        PATIENT DETAILS Name: [name] Age: 50 y.o. Sex: female Date of Birth: 07-Sep-1966 Admit Date: 08/14/2016 Admitting Physician Michael Litter, MD WUJ:[id number] [name]  Brief Narrative: Patient is a 50 y.o. female with prior history of CVA, dyslipidemia, hypertension, seizure disorder who was admitted for evaluation of slurred speech and left facial droop. MRI of the brain demonstrates a left paracentral pontine infarct. See below for further details  Subjective: Wants to go home. Feels better, speech slow but slightly slurred.  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 Has mild right-sided deficits on exam. Await carotid Doppler and echocardiogram. Recent A1c in August 13.6, LDL 121. Recommendations are to continue aspirin. Await further workup.  Hypertension: Allow mild permissive hypertension, continue lisinopril and HCTZ. Follow  Dyslipidemia: Continue statin  ? UTI: Continue Rocephin, will plan a very short course of antibiotics-weight cultures  Uncontrolled Insulin-dependent diabetes with hyperglycemia: CBGs uncontrolled, increase Levemir to 35 units, and 6 meals of NovoLog with meals. Follow CBGs and adjust accordingly  History of seizures: Continue Depakote.  History of depression: Recently presented to the ED in  on 11/26 with bizarre behavior-and was evaluated by psychiatry-not felt to require inpatient psychiatric admission. During my evaluation, she was awake and alert and answering all my questions appropriately. Denied any suicidal or hemostatic ideation  Morbid obesity: Counseled   DVT Prophylaxis: Prophylactic Lovenox  Code Status: Full code  Family Communication: None at bedside  Disposition Plan: Remain inpatient-suspect would require home health on discharge  Antimicrobial agents: None  Procedures: None  CONSULTS:   neurology  Time spent: 25 minutes-Greater than 50% of this time was spent in counseling, explanation of diagnosis, planning of further management, and coordination of care.  MEDICATIONS: Anti-infectives    Start     Dose/Rate Route Frequency Ordered Stop   08/15/16 0330  cefTRIAXone (ROCEPHIN) 1 g in dextrose 5 % 50 mL IVPB     1 g 100 mL/hr over 30 Minutes Intravenous Daily at bedtime 08/15/16 0257        Scheduled Meds: . aspirin  325 mg Oral Daily  . atorvastatin  40 mg Oral q1800  . cefTRIAXone (ROCEPHIN)  IV  1 g Intravenous QHS  . divalproex  500 mg Oral Daily  . hydrochlorothiazide  12.5 mg Oral Daily  . insulin aspart  0-20 Units Subcutaneous TID WC  . insulin aspart  0-5 Units Subcutaneous QHS  . insulin detemir  30 Units Subcutaneous QHS  . lisinopril  20 mg Oral Daily   Continuous Infusions: PRN Meds:.acetaminophen **OR** acetaminophen   PHYSICAL EXAM: Vital signs: Vitals:   08/15/16 0400 08/15/16 0600 08/15/16 1027 08/15/16 1356  BP: 140/78 139/81 121/64 137/75  Pulse: 91 94 91 100  Resp: 20 18 18 19   Temp: 97.8 F (36.6 C) 97.7 F (36.5 C) 97.3 F (36.3 C) 98.1 F (36.7 C)  TempSrc: Oral Oral Oral Oral  SpO2: 100% 94% 99% 99%  Weight:      Height:       Filed Weights   08/14/16 1210 08/14/16 2200  Weight: 106.6 kg (235 lb) 108.1 kg (238 lb 5.1 oz)   Body mass index is 43.59 kg/m.   General appearance :Awake, alert, not in any distress.  Eyes:, pupils equally reactive to light and  accomodation,no scleral icterus HEENT: Atraumatic and Normocephalic Neck: supple, no JVD. No cervical lymphadenopathy. No thyromegaly Resp:Good air entry bilaterally, no added sounds  CVS: S1 S2 regular, no murmurs.  GI: Bowel sounds present, Non tender and not distended with no gaurding, rigidity or rebound.No organomegaly Extremities: B/L Lower Ext shows no edema, both legs are warm to touch Neurology:  Right upper/right lower extremity around  4/5. Musculoskeletal:No digital cyanosis Skin:No Rash, warm and dry Wounds:N/A  I have personally reviewed following labs and imaging studies  LABORATORY DATA: CBC:  Recent Labs Lab 08/14/16 1244  WBC 5.8  NEUTROABS 3.6  HGB 13.2  HCT 40.6  MCV 61.1*  PLT 149*    Basic Metabolic Panel:  Recent Labs Lab 08/12/16 1351 08/14/16 1244  NA 137 136  K 4.0 4.3  CL 102 103  CO2 26 23  GLUCOSE 306* 311*  BUN 12 16  CREATININE 0.97 0.88  CALCIUM 8.9 9.6    GFR: Estimated Creatinine Clearance: 88.5 mL/min (by C-G formula based on SCr of 0.88 mg/dL).  Liver Function Tests:  Recent Labs Lab 08/12/16 1351 08/14/16 1244  AST 14* 18  ALT 15 14  ALKPHOS 53 52  BILITOT 0.7 0.3  PROT 7.5 6.8  ALBUMIN 3.8 3.9   No results for input(s): LIPASE, AMYLASE in the last 168 hours. No results for input(s): AMMONIA in the last 168 hours.  Coagulation Profile: No results for input(s): INR, PROTIME in the last 168 hours.  Cardiac Enzymes: No results for input(s): CKTOTAL, CKMB, CKMBINDEX, TROPONINI in the last 168 hours.  BNP (last 3 results) No results for input(s): PROBNP in the last 8760 hours.  HbA1C: No results for input(s): HGBA1C in the last 72 hours.  CBG:  Recent Labs Lab 08/13/16 1141 08/14/16 1214 08/14/16 2343 08/15/16 0625 08/15/16 1102  GLUCAP 282* 311* 280* 207* 307*    Lipid Profile:  Recent Labs  08/15/16 0347  CHOL 189  HDL 43  LDLCALC 121*  TRIG 125  CHOLHDL 4.4    Thyroid Function Tests: No results for input(s): TSH, T4TOTAL, FREET4, T3FREE, THYROIDAB in the last 72 hours.  Anemia Panel: No results for input(s): VITAMINB12, FOLATE, FERRITIN, TIBC, IRON, RETICCTPCT in the last 72 hours.  Urine analysis:    Component Value Date/Time   COLORURINE AMBER (A) 08/15/2016 0425   APPEARANCEUR CLEAR 08/15/2016 0425   APPEARANCEUR Clear 12/27/2014 1806   LABSPEC 1.025 08/15/2016 0425   LABSPEC 1.035 12/27/2014 1806   PHURINE 5.5  08/15/2016 0425   GLUCOSEU NEGATIVE 08/15/2016 0425   GLUCOSEU >=500 12/27/2014 1806   HGBUR NEGATIVE 08/15/2016 0425   BILIRUBINUR SMALL (A) 08/15/2016 0425   BILIRUBINUR Negative 12/27/2014 1806   KETONESUR 40 (A) 08/15/2016 0425   PROTEINUR NEGATIVE 08/15/2016 0425   NITRITE NEGATIVE 08/15/2016 0425   LEUKOCYTESUR NEGATIVE 08/15/2016 0425   LEUKOCYTESUR Negative 12/27/2014 1806    Sepsis Labs: Lactic Acid, Venous No results found for: LATICACIDVEN  MICROBIOLOGY: Recent Results (from the past 240 hour(s))  Urine culture     Status: Abnormal (Preliminary result)   Collection Time: 08/13/16 10:15 AM  Result Value Ref Range Status   Specimen Description URINE, RANDOM  Final   Special Requests NONE  Final   Culture >=100,000 COLONIES/mL ESCHERICHIA COLI (A)  Final   Report Status PENDING  Incomplete    RADIOLOGY STUDIES/RESULTS: Ct Head Wo Contrast  Result Date: 08/14/2016 CLINICAL DATA:  Left-sided weakness and slurred speech. EXAM: CT HEAD WITHOUT CONTRAST TECHNIQUE: Contiguous axial images  were obtained from the base of the skull through the vertex without intravenous contrast. COMPARISON:  None. FINDINGS: Brain: There is extensive encephalomalacia in the right parietal and anterior temporal lobes. There is ex vacuo dilatation of the right lateral ventricle. There are chronic ischemic changes in the periventricular white matter. There is also global atrophy. There is no mass effect or midline shift. No evidence of hemorrhage. Vascular: There is a calcified soft tissue mass in the region of the right carotid terminus. Aneurysm is not excluded. This may simply represent ectatic vasculature and atherosclerotic calcification. Skull: Cranium is intact. Sinuses/Orbits: Minimal mucus material layers in the right maxillary sinus. Remainder the visualized paranasal sinuses are clear. Mastoid air cells are clear. Other: None. IMPRESSION: Extensive chronic ischemic changes in the right MCA  territory. Ectatic vasculature versus aneurysm in the right internal carotid artery. CT angiogram may be helpful. Electronically Signed   By: Jolaine ClickArthur  Hoss M.D.   On: 08/14/2016 13:14   Mr Maxine GlennMra Head Wo Contrast  Result Date: 08/14/2016 CLINICAL DATA:  50 year old hypertensive diabetic female with hyperlipidemia and history of infarct and seizures presenting with left-sided weakness. Subsequent encounter. EXAM: MRI HEAD WITHOUT CONTRAST MRA HEAD WITHOUT CONTRAST TECHNIQUE: Multiplanar, multiecho pulse sequences of the brain and surrounding structures were obtained without intravenous contrast. Angiographic images of the head were obtained using MRA technique without contrast. COMPARISON:  08/14/2016 head CT.  No comparison brain MR. FINDINGS: MRI HEAD FINDINGS Exam is motion degraded. Brain: Acute nonhemorrhagic left paracentral pontine infarct. Acute/ subacute infarct anterior margin of the posterior limb of the left internal capsule. Remote large right middle cerebral artery distribution infarct with encephalomalacia right temporal lobe, right parietal lobe and posterior right frontal lobe with subsequent dilation of the right lateral ventricle. Wallerian degeneration of the right cerebral peduncle. Mild chronic microvascular changes. Global atrophy without hydrocephalus. Punctate blood breakdown products left lenticular nucleus left thalamus consistent prior episodes hemorrhagic ischemia. Pituitary gland top-normal size measuring up to 7.8 mm with slightly convex superior margin without suprasellar extension. Vascular: As below. Skull and upper cervical spine: No acute abnormality. Sinuses/Orbits: Mucosal thickening inferior maxillary sinuses measuring up to 7 mm on the left and 3 mm on the right. No acute orbital abnormality. Other: Negative MRA HEAD FINDINGS Moderate narrowing and irregularity cavernous segment right internal carotid artery. Moderate tandem stenosis M1 segment right middle cerebral artery.  Decrease number of visualized right middle cerebral artery branches consistent with patient's right middle cerebral artery distribution infarct. Vessels which are visualized appear narrowed and irregular. Moderate to marked narrowing proximal A1 segment right anterior cerebral artery. Moderate tandem stenosis A1 segment left anterior cerebral artery. Moderate narrowing mid aspect M1 segment left middle cerebral artery. Left middle cerebral artery moderate branch vessel narrowing and irregularity. Right vertebral artery ends in a narrowed irregular right posterior inferior cerebellar artery distribution. Ectatic left vertebral artery mild narrowing distally. Nonvisualized left posterior inferior cerebellar artery. Mild irregularity in ectasia basilar artery without high-grade stenosis. Nonvisualized right anterior inferior cerebellar artery. Posterior cerebral artery distal branch vessel mild moderate narrowing greater on the right. No aneurysm noted. IMPRESSION: MRI HEAD Exam is motion degraded. Acute nonhemorrhagic left paracentral pontine infarct. Acute/ subacute infarct anterior margin of the posterior limb of the left internal capsule. 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 Mild chronic microvascular changes. Global atrophy. Punctate blood breakdown products left lenticular nucleus left thalamus consistent prior episodes of hemorrhagic ischemia. Pituitary gland top-normal size. Mucosal thickening inferior maxillary sinuses measuring  up to 7 mm on the left and 3 mm on the right. MRA HEAD Moderate narrowing and irregularity cavernous segment right internal carotid artery. Moderate tandem stenosis M1 segment right middle cerebral artery. Decrease number of visualized right middle cerebral artery branches consistent with patient's right middle cerebral artery distribution infarct. Vessels which are visualized appear narrowed and irregular. Moderate to marked narrowing  proximal A1 segment right anterior cerebral artery. Moderate tandem stenosis A1 segment left anterior cerebral artery. Moderate narrowing mid aspect M1 segment left middle cerebral artery. Left middle cerebral artery moderate branch vessel narrowing and irregularity. Right vertebral artery ends in a narrowed irregular right posterior inferior cerebellar artery distribution. Ectatic left vertebral artery mild narrowing distally. Nonvisualized left posterior inferior cerebellar artery. Mild irregularity and ectasia basilar artery without high-grade stenosis. Nonvisualized right anterior inferior cerebellar artery. Posterior cerebral artery distal branch vessel mild moderate narrowing greater on the right. No aneurysm noted. Electronically Signed   By: Lacy DuverneySteven  Olson M.D.   On: 08/14/2016 18:55   Mr Brain Wo Contrast  Result Date: 08/14/2016 CLINICAL DATA:  50 year old hypertensive diabetic female with hyperlipidemia and history of infarct and seizures presenting with left-sided weakness. Subsequent encounter. EXAM: MRI HEAD WITHOUT CONTRAST MRA HEAD WITHOUT CONTRAST TECHNIQUE: Multiplanar, multiecho pulse sequences of the brain and surrounding structures were obtained without intravenous contrast. Angiographic images of the head were obtained using MRA technique without contrast. COMPARISON:  08/14/2016 head CT.  No comparison brain MR. FINDINGS: MRI HEAD FINDINGS Exam is motion degraded. Brain: Acute nonhemorrhagic left paracentral pontine infarct. Acute/ subacute infarct anterior margin of the posterior limb of the left internal capsule. Remote large right middle cerebral artery distribution infarct with encephalomalacia right temporal lobe, right parietal lobe and posterior right frontal lobe with subsequent dilation of the right lateral ventricle. Wallerian degeneration of the right cerebral peduncle. Mild chronic microvascular changes. Global atrophy without hydrocephalus. Punctate blood breakdown products  left lenticular nucleus left thalamus consistent prior episodes hemorrhagic ischemia. Pituitary gland top-normal size measuring up to 7.8 mm with slightly convex superior margin without suprasellar extension. Vascular: As below. Skull and upper cervical spine: No acute abnormality. Sinuses/Orbits: Mucosal thickening inferior maxillary sinuses measuring up to 7 mm on the left and 3 mm on the right. No acute orbital abnormality. Other: Negative MRA HEAD FINDINGS Moderate narrowing and irregularity cavernous segment right internal carotid artery. Moderate tandem stenosis M1 segment right middle cerebral artery. Decrease number of visualized right middle cerebral artery branches consistent with patient's right middle cerebral artery distribution infarct. Vessels which are visualized appear narrowed and irregular. Moderate to marked narrowing proximal A1 segment right anterior cerebral artery. Moderate tandem stenosis A1 segment left anterior cerebral artery. Moderate narrowing mid aspect M1 segment left middle cerebral artery. Left middle cerebral artery moderate branch vessel narrowing and irregularity. Right vertebral artery ends in a narrowed irregular right posterior inferior cerebellar artery distribution. Ectatic left vertebral artery mild narrowing distally. Nonvisualized left posterior inferior cerebellar artery. Mild irregularity in ectasia basilar artery without high-grade stenosis. Nonvisualized right anterior inferior cerebellar artery. Posterior cerebral artery distal branch vessel mild moderate narrowing greater on the right. No aneurysm noted. IMPRESSION: MRI HEAD Exam is motion degraded. Acute nonhemorrhagic left paracentral pontine infarct. Acute/ subacute infarct anterior margin of the posterior limb of the left internal capsule. 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 Mild chronic microvascular changes. Global atrophy. Punctate blood breakdown products  left lenticular nucleus left thalamus consistent prior episodes of hemorrhagic ischemia. Pituitary gland  top-normal size. Mucosal thickening inferior maxillary sinuses measuring up to 7 mm on the left and 3 mm on the right. MRA HEAD Moderate narrowing and irregularity cavernous segment right internal carotid artery. Moderate tandem stenosis M1 segment right middle cerebral artery. Decrease number of visualized right middle cerebral artery branches consistent with patient's right middle cerebral artery distribution infarct. Vessels which are visualized appear narrowed and irregular. Moderate to marked narrowing proximal A1 segment right anterior cerebral artery. Moderate tandem stenosis A1 segment left anterior cerebral artery. Moderate narrowing mid aspect M1 segment left middle cerebral artery. Left middle cerebral artery moderate branch vessel narrowing and irregularity. Right vertebral artery ends in a narrowed irregular right posterior inferior cerebellar artery distribution. Ectatic left vertebral artery mild narrowing distally. Nonvisualized left posterior inferior cerebellar artery. Mild irregularity and ectasia basilar artery without high-grade stenosis. Nonvisualized right anterior inferior cerebellar artery. Posterior cerebral artery distal branch vessel mild moderate narrowing greater on the right. No aneurysm noted. Electronically Signed   By: Lacy Duverney M.D.   On: 08/14/2016 18:55     LOS: 1 day   Jeoffrey Massed, MD  [facility] Pager:[phone]  If 7PM-7AM, please contact night-coverage [email or web address] Password [email or web address] 08/15/2016, 5:01 PM

## 2016-08-15 NOTE — Consult Note (Signed)
Admission H&P    Chief Complaint: New onset left-sided weakness.  HPI: [name] is an 50 y.o. female with a history of hypertension, diabetes mellitus, hyperlipidemia, previous stroke and seizure disorder, presenting with new onset left-sided weakness which started about 8 AM on 05/14/2016. A she has been taking aspirin daily. She went to see her primary doctor who sent her to the ED. CT scan of her head was obtained which was unremarkable. MRI of the brain showed a left paracentral pontine ischemic infarction as well as left posterior internal capsular infarction. 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 Patient had no focal deficits at the time of this evaluation except for mild left lower facial weakness.  LSN: 8:00 AM on 08/14/2016 tPA Given: No: Deficits rapidly improved mRankin:  Past Medical History:  Diagnosis Date  . Allergic rhinitis   . Chickenpox   . Depression   . Diabetes ([place])   . Epilepsy ([place])   . Headache   . HTN (hypertension)   . Hyperlipidemia   . Seizures ([place])   . Stroke ([place])   . Urinary incontinence     Past Surgical History:  Procedure Laterality Date  . CESAREAN SECTION    . INCISION AND DRAINAGE Right 05/13/2016   Procedure: INCISION AND DRAINAGE;  Surgeon: Samara Deist, DPM;  Location: [facility] ORS;  Service: Podiatry;  Laterality: Right;    Family History  Problem Relation Age of Onset  . Hypertension Mother   . Diabetes Mellitus II Mother   . Hypertension Father   . Pancreatic cancer Father    Social History:  reports that she has never smoked. She has never used smokeless tobacco. She reports that she does not drink alcohol or use drugs.  Allergies: No Known Allergies  Medications Prior to Admission  Medication Sig Dispense Refill  . aspirin EC 325 MG EC tablet Take 1 tablet (325 mg total) by mouth daily. 30 tablet 0  . butalbital-acetaminophen-caffeine (FIORICET, ESGIC) 50-325-40 MG tablet Take 1-2 tablets by mouth every 6 (six) hours as  needed for headache. 20 tablet 0  . cyclobenzaprine (FLEXERIL) 5 MG tablet Take 1 tablet (5 mg total) by mouth 3 (three) times daily as needed for muscle spasms. 30 tablet 0  . etodolac (LODINE) 400 MG tablet Take 400 mg by mouth daily as needed.    Marland Kitchen glipiZIDE (GLUCOTROL XL) 5 MG 24 hr tablet Take 5 mg by mouth daily.    . insulin aspart (NOVOLOG FLEXPEN) 100 UNIT/ML FlexPen Inject 10 Units into the skin 3 (three) times daily with meals. 15 mL 11  . Insulin Detemir (LEVEMIR FLEXPEN) 100 UNIT/ML Pen Inject 30 Units into the skin daily at 10 pm. 15 mL 11  . lisinopril-hydrochlorothiazide (PRINZIDE,ZESTORETIC) 20-12.5 MG per tablet Take 1 tablet by mouth daily. 30 tablet 1  . metFORMIN (GLUCOPHAGE) 500 MG tablet Take 500 mg by mouth 2 (two) times daily with a meal.    . sulfamethoxazole-trimethoprim (BACTRIM DS,SEPTRA DS) 800-160 MG tablet Take 1 tablet by mouth 2 (two) times daily. 20 tablet 0  . divalproex (DEPAKOTE ER) 500 MG 24 hr tablet Take 500 mg by mouth daily.      ROS: History obtained from chart review and the patient  General ROS: negative for - chills, fatigue, fever, night sweats, weight gain or weight loss Psychological ROS: negative for - behavioral disorder, hallucinations, memory difficulties, mood swings or suicidal ideation Ophthalmic ROS: negative for - blurry vision, double vision, eye pain or loss of vision  ENT ROS: negative for - epistaxis, nasal discharge, oral lesions, sore throat, tinnitus or vertigo Allergy and Immunology ROS: negative for - hives or itchy/watery eyes Hematological and Lymphatic ROS: negative for - bleeding problems, bruising or swollen lymph nodes Endocrine ROS: negative for - galactorrhea, hair pattern changes, polydipsia/polyuria or temperature intolerance Respiratory ROS: negative for - cough, hemoptysis, shortness of breath or wheezing Cardiovascular ROS: negative for - chest pain, dyspnea on exertion, edema or irregular  heartbeat Gastrointestinal ROS: negative for - abdominal pain, diarrhea, hematemesis, nausea/vomiting or stool incontinence Genito-Urinary ROS: negative for - dysuria, hematuria, incontinence or urinary frequency/urgency Musculoskeletal ROS: negative for - joint swelling or muscular weakness Neurological ROS: as noted in HPI Dermatological ROS: negative for rash and skin lesion changes  Physical Examination: Blood pressure 139/81, pulse 94, temperature 97.7 F (36.5 C), temperature source Oral, resp. rate 18, height 5' 2"  (1.575 m), weight 108.1 kg (238 lb 5.1 oz), last menstrual period 07/17/2016, SpO2 94 %.  HEENT-  Normocephalic, no lesions, without obvious abnormality.  Normal external eye and conjunctiva.  Normal TM's bilaterally.  Normal auditory canals and external ears. Normal external nose, mucus membranes and septum.  Normal pharynx. Neck supple with no masses, nodes, nodules or enlargement. Cardiovascular - regular rate and rhythm, S1, S2 normal, no murmur, click, rub or gallop Lungs - chest clear, no wheezing, rales, normal symmetric air entry Abdomen - soft, non-tender; bowel sounds normal; no masses,  no organomegaly Extremities - no joint deformities, effusion, or inflammation  Neurologic Examination: Mental Status: Drowsy, oriented, no acute distress.  Speech fluent without evidence of aphasia. Able to follow commands without difficulty. Cranial Nerves: II-Visual fields were normal. III/IV/VI-Pupils were equal and reacted normally to light. Extraocular movements were full and conjugate.    V/VII-no facial numbnes; mild left lower facial weakness. VIII-normal. X-normal speech and symmetrical palatal movement. XI: trapezius strength/neck flexion strength normal bilaterally XII-midline tongue extension with normal strength. Motor: 5/5 bilaterally with normal tone and bulk Sensory: Normal throughout. Deep Tendon Reflexes: Trace to 1+ in upper extremities and absent in lower  extremities. Plantars: Flexor bilaterally Cerebellar: Normal finger-to-nose testing. Carotid auscultation: Normal  Results for orders placed or performed during the hospital encounter of 08/14/16 (from the past 48 hour(s))  CBG monitoring, ED     Status: Abnormal   Collection Time: 08/14/16 12:14 PM  Result Value Ref Range   Glucose-Capillary 311 (H) 65 - 99 mg/dL   Comment 1 Notify RN    Comment 2 Document in Chart   CBC with Differential     Status: Abnormal   Collection Time: 08/14/16 12:44 PM  Result Value Ref Range   WBC 5.8 4.0 - 10.5 K/uL   RBC 6.65 (H) 3.87 - 5.11 MIL/uL   Hemoglobin 13.2 12.0 - 15.0 g/dL   HCT 40.6 36.0 - 46.0 %   MCV 61.1 (L) 78.0 - 100.0 fL   MCH 19.8 (L) 26.0 - 34.0 pg   MCHC 32.5 30.0 - 36.0 g/dL   RDW 18.9 (H) 11.5 - 15.5 %   Platelets 149 (L) 150 - 400 K/uL   Neutrophils Relative % 62 %   Lymphocytes Relative 26 %   Monocytes Relative 10 %   Eosinophils Relative 2 %   Basophils Relative 0 %   Neutro Abs 3.6 1.7 - 7.7 K/uL   Lymphs Abs 1.5 0.7 - 4.0 K/uL   Monocytes Absolute 0.6 0.1 - 1.0 K/uL   Eosinophils Absolute 0.1 0.0 - 0.7 K/uL  Basophils Absolute 0.0 0.0 - 0.1 K/uL   Smear Review MORPHOLOGY UNREMARKABLE   Comprehensive metabolic panel     Status: Abnormal   Collection Time: 08/14/16 12:44 PM  Result Value Ref Range   Sodium 136 135 - 145 mmol/L   Potassium 4.3 3.5 - 5.1 mmol/L   Chloride 103 101 - 111 mmol/L   CO2 23 22 - 32 mmol/L   Glucose, Bld 311 (H) 65 - 99 mg/dL   BUN 16 6 - 20 mg/dL   Creatinine, Ser 0.88 0.44 - 1.00 mg/dL   Calcium 9.6 8.9 - 10.3 mg/dL   Total Protein 6.8 6.5 - 8.1 g/dL   Albumin 3.9 3.5 - 5.0 g/dL   AST 18 15 - 41 U/L   ALT 14 14 - 54 U/L   Alkaline Phosphatase 52 38 - 126 U/L   Total Bilirubin 0.3 0.3 - 1.2 mg/dL   GFR calc non Af Amer >60 >60 mL/min   GFR calc Af Amer >60 >60 mL/min    Comment: (NOTE) The eGFR has been calculated using the CKD EPI equation. This calculation has not been  validated in all clinical situations. eGFR's persistently <60 mL/min signify possible Chronic Kidney Disease.    Anion gap 10 5 - 15  Ethanol     Status: None   Collection Time: 08/14/16 12:44 PM  Result Value Ref Range   Alcohol, Ethyl (B) <5 <5 mg/dL    Comment:        LOWEST DETECTABLE LIMIT FOR SERUM ALCOHOL IS 5 mg/dL FOR MEDICAL PURPOSES ONLY   I-stat troponin, ED     Status: None   Collection Time: 08/14/16 12:53 PM  Result Value Ref Range   Troponin i, poc 0.02 0.00 - 0.08 ng/mL   Comment 3            Comment: Due to the release kinetics of cTnI, a negative result within the first hours of the onset of symptoms does not rule out myocardial infarction with certainty. If myocardial infarction is still suspected, repeat the test at appropriate intervals.   Glucose, capillary     Status: Abnormal   Collection Time: 08/14/16 11:43 PM  Result Value Ref Range   Glucose-Capillary 280 (H) 65 - 99 mg/dL  Lipid panel     Status: Abnormal   Collection Time: 08/15/16  3:47 AM  Result Value Ref Range   Cholesterol 189 0 - 200 mg/dL   Triglycerides 125 <150 mg/dL   HDL 43 >40 mg/dL   Total CHOL/HDL Ratio 4.4 RATIO   VLDL 25 0 - 40 mg/dL   LDL Cholesterol 121 (H) 0 - 99 mg/dL    Comment:        Total Cholesterol/HDL:CHD Risk Coronary Heart Disease Risk Table                     Men   Women  1/2 Average Risk   3.4   3.3  Average Risk       5.0   4.4  2 X Average Risk   9.6   7.1  3 X Average Risk  23.4   11.0        Use the calculated Patient Ratio above and the CHD Risk Table to determine the patient's CHD Risk.        ATP III CLASSIFICATION (LDL):  <100     mg/dL   Optimal  100-129  mg/dL   Near or Above  Optimal  130-159  mg/dL   Borderline  160-189  mg/dL   High  >190     mg/dL   Very High   Urinalysis, Routine w reflex microscopic (not at [facility])     Status: Abnormal   Collection Time: 08/15/16  4:25 AM  Result Value Ref Range   Color, Urine  AMBER (A) YELLOW    Comment: BIOCHEMICALS MAY BE AFFECTED BY COLOR   APPearance CLEAR CLEAR   Specific Gravity, Urine 1.025 1.005 - 1.030   pH 5.5 5.0 - 8.0   Glucose, UA NEGATIVE NEGATIVE mg/dL   Hgb urine dipstick NEGATIVE NEGATIVE   Bilirubin Urine SMALL (A) NEGATIVE   Ketones, ur 40 (A) NEGATIVE mg/dL   Protein, ur NEGATIVE NEGATIVE mg/dL   Nitrite NEGATIVE NEGATIVE   Leukocytes, UA NEGATIVE NEGATIVE    Comment: MICROSCOPIC NOT DONE ON URINES WITH NEGATIVE PROTEIN, BLOOD, LEUKOCYTES, NITRITE, OR GLUCOSE <1000 mg/dL.  Glucose, capillary     Status: Abnormal   Collection Time: 08/15/16  6:25 AM  Result Value Ref Range   Glucose-Capillary 207 (H) 65 - 99 mg/dL   Comment 1 Notify RN    Comment 2 Document in Chart    Ct Head Wo Contrast  Result Date: 08/14/2016 CLINICAL DATA:  Left-sided weakness and slurred speech. EXAM: CT HEAD WITHOUT CONTRAST TECHNIQUE: Contiguous axial images were obtained from the base of the skull through the vertex without intravenous contrast. COMPARISON:  None. FINDINGS: Brain: There is extensive encephalomalacia in the right parietal and anterior temporal lobes. There is ex vacuo dilatation of the right lateral ventricle. There are chronic ischemic changes in the periventricular white matter. There is also global atrophy. There is no mass effect or midline shift. No evidence of hemorrhage. Vascular: There is a calcified soft tissue mass in the region of the right carotid terminus. Aneurysm is not excluded. This may simply represent ectatic vasculature and atherosclerotic calcification. Skull: Cranium is intact. Sinuses/Orbits: Minimal mucus material layers in the right maxillary sinus. Remainder the visualized paranasal sinuses are clear. Mastoid air cells are clear. Other: None. IMPRESSION: Extensive chronic ischemic changes in the right MCA territory. Ectatic vasculature versus aneurysm in the right internal carotid artery. CT angiogram may be helpful.  Electronically Signed   By: Marybelle Killings M.D.   On: 08/14/2016 13:14   Mr Jodene Nam Head Wo Contrast  Result Date: 08/14/2016 CLINICAL DATA:  50 year old hypertensive diabetic female with hyperlipidemia and history of infarct and seizures presenting with left-sided weakness. Subsequent encounter. EXAM: MRI HEAD WITHOUT CONTRAST MRA HEAD WITHOUT CONTRAST TECHNIQUE: Multiplanar, multiecho pulse sequences of the brain and surrounding structures were obtained without intravenous contrast. Angiographic images of the head were obtained using MRA technique without contrast. COMPARISON:  08/14/2016 head CT.  No comparison brain MR. FINDINGS: MRI HEAD FINDINGS Exam is motion degraded. Brain: Acute nonhemorrhagic left paracentral pontine infarct. Acute/ subacute infarct anterior margin of the posterior limb of the left internal capsule. Remote large right middle cerebral artery distribution infarct with encephalomalacia right temporal lobe, right parietal lobe and posterior right frontal lobe with subsequent dilation of the right lateral ventricle. Wallerian degeneration of the right cerebral peduncle. Mild chronic microvascular changes. Global atrophy without hydrocephalus. Punctate blood breakdown products left lenticular nucleus left thalamus consistent prior episodes hemorrhagic ischemia. Pituitary gland top-normal size measuring up to 7.8 mm with slightly convex superior margin without suprasellar extension. Vascular: As below. Skull and upper cervical spine: No acute abnormality. Sinuses/Orbits: Mucosal thickening inferior maxillary sinuses measuring up  to 7 mm on the left and 3 mm on the right. No acute orbital abnormality. Other: Negative MRA HEAD FINDINGS Moderate narrowing and irregularity cavernous segment right internal carotid artery. Moderate tandem stenosis M1 segment right middle cerebral artery. Decrease number of visualized right middle cerebral artery branches consistent with patient's right middle cerebral  artery distribution infarct. Vessels which are visualized appear narrowed and irregular. Moderate to marked narrowing proximal A1 segment right anterior cerebral artery. Moderate tandem stenosis A1 segment left anterior cerebral artery. Moderate narrowing mid aspect M1 segment left middle cerebral artery. Left middle cerebral artery moderate branch vessel narrowing and irregularity. Right vertebral artery ends in a narrowed irregular right posterior inferior cerebellar artery distribution. Ectatic left vertebral artery mild narrowing distally. Nonvisualized left posterior inferior cerebellar artery. Mild irregularity in ectasia basilar artery without high-grade stenosis. Nonvisualized right anterior inferior cerebellar artery. Posterior cerebral artery distal branch vessel mild moderate narrowing greater on the right. No aneurysm noted. IMPRESSION: MRI HEAD Exam is motion degraded. Acute nonhemorrhagic left paracentral pontine infarct. Acute/ subacute infarct anterior margin of the posterior limb of the left internal capsule. 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 Mild chronic microvascular changes. Global atrophy. Punctate blood breakdown products left lenticular nucleus left thalamus consistent prior episodes of hemorrhagic ischemia. Pituitary gland top-normal size. Mucosal thickening inferior maxillary sinuses measuring up to 7 mm on the left and 3 mm on the right. MRA HEAD Moderate narrowing and irregularity cavernous segment right internal carotid artery. Moderate tandem stenosis M1 segment right middle cerebral artery. Decrease number of visualized right middle cerebral artery branches consistent with patient's right middle cerebral artery distribution infarct. Vessels which are visualized appear narrowed and irregular. Moderate to marked narrowing proximal A1 segment right anterior cerebral artery. Moderate tandem stenosis A1 segment left anterior cerebral  artery. Moderate narrowing mid aspect M1 segment left middle cerebral artery. Left middle cerebral artery moderate branch vessel narrowing and irregularity. Right vertebral artery ends in a narrowed irregular right posterior inferior cerebellar artery distribution. Ectatic left vertebral artery mild narrowing distally. Nonvisualized left posterior inferior cerebellar artery. Mild irregularity and ectasia basilar artery without high-grade stenosis. Nonvisualized right anterior inferior cerebellar artery. Posterior cerebral artery distal branch vessel mild moderate narrowing greater on the right. No aneurysm noted. Electronically Signed   By: Genia Del M.D.   On: 08/14/2016 18:55   Mr Brain Wo Contrast  Result Date: 08/14/2016 CLINICAL DATA:  50 year old hypertensive diabetic female with hyperlipidemia and history of infarct and seizures presenting with left-sided weakness. Subsequent encounter. EXAM: MRI HEAD WITHOUT CONTRAST MRA HEAD WITHOUT CONTRAST TECHNIQUE: Multiplanar, multiecho pulse sequences of the brain and surrounding structures were obtained without intravenous contrast. Angiographic images of the head were obtained using MRA technique without contrast. COMPARISON:  08/14/2016 head CT.  No comparison brain MR. FINDINGS: MRI HEAD FINDINGS Exam is motion degraded. Brain: Acute nonhemorrhagic left paracentral pontine infarct. Acute/ subacute infarct anterior margin of the posterior limb of the left internal capsule. Remote large right middle cerebral artery distribution infarct with encephalomalacia right temporal lobe, right parietal lobe and posterior right frontal lobe with subsequent dilation of the right lateral ventricle. Wallerian degeneration of the right cerebral peduncle. Mild chronic microvascular changes. Global atrophy without hydrocephalus. Punctate blood breakdown products left lenticular nucleus left thalamus consistent prior episodes hemorrhagic ischemia. Pituitary gland top-normal  size measuring up to 7.8 mm with slightly convex superior margin without suprasellar extension. Vascular: As below. Skull and upper cervical spine: No acute abnormality.  Sinuses/Orbits: Mucosal thickening inferior maxillary sinuses measuring up to 7 mm on the left and 3 mm on the right. No acute orbital abnormality. Other: Negative MRA HEAD FINDINGS Moderate narrowing and irregularity cavernous segment right internal carotid artery. Moderate tandem stenosis M1 segment right middle cerebral artery. Decrease number of visualized right middle cerebral artery branches consistent with patient's right middle cerebral artery distribution infarct. Vessels which are visualized appear narrowed and irregular. Moderate to marked narrowing proximal A1 segment right anterior cerebral artery. Moderate tandem stenosis A1 segment left anterior cerebral artery. Moderate narrowing mid aspect M1 segment left middle cerebral artery. Left middle cerebral artery moderate branch vessel narrowing and irregularity. Right vertebral artery ends in a narrowed irregular right posterior inferior cerebellar artery distribution. Ectatic left vertebral artery mild narrowing distally. Nonvisualized left posterior inferior cerebellar artery. Mild irregularity in ectasia basilar artery without high-grade stenosis. Nonvisualized right anterior inferior cerebellar artery. Posterior cerebral artery distal branch vessel mild moderate narrowing greater on the right. No aneurysm noted. IMPRESSION: MRI HEAD Exam is motion degraded. Acute nonhemorrhagic left paracentral pontine infarct. Acute/ subacute infarct anterior margin of the posterior limb of the left internal capsule. 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 Mild chronic microvascular changes. Global atrophy. Punctate blood breakdown products left lenticular nucleus left thalamus consistent prior episodes of hemorrhagic ischemia. Pituitary gland top-normal  size. Mucosal thickening inferior maxillary sinuses measuring up to 7 mm on the left and 3 mm on the right. MRA HEAD Moderate narrowing and irregularity cavernous segment right internal carotid artery. Moderate tandem stenosis M1 segment right middle cerebral artery. Decrease number of visualized right middle cerebral artery branches consistent with patient's right middle cerebral artery distribution infarct. Vessels which are visualized appear narrowed and irregular. Moderate to marked narrowing proximal A1 segment right anterior cerebral artery. Moderate tandem stenosis A1 segment left anterior cerebral artery. Moderate narrowing mid aspect M1 segment left middle cerebral artery. Left middle cerebral artery moderate branch vessel narrowing and irregularity. Right vertebral artery ends in a narrowed irregular right posterior inferior cerebellar artery distribution. Ectatic left vertebral artery mild narrowing distally. Nonvisualized left posterior inferior cerebellar artery. Mild irregularity and ectasia basilar artery without high-grade stenosis. Nonvisualized right anterior inferior cerebellar artery. Posterior cerebral artery distal branch vessel mild moderate narrowing greater on the right. No aneurysm noted. Electronically Signed   By: Genia Del M.D.   On: 08/14/2016 18:55    Assessment: 49 y.o. female with multiple risk factors for stroke as well as previous right MCA infarction, presenting with acute left pontine infarction as well as left posterior limb of internal capsule infarction.  Stroke Risk Factors - diabetes mellitus, family history, hyperlipidemia and hypertension  Plan: 1. HgbA1c, fasting lipid panel 2. Telemetry monitoring 3. PT consult, OT consult, Speech consult 4. Echocardiogram 5. Carotid dopplers 6. Prophylactic therapy-Antiplatelet med: Aspirin  7. Risk factor modification  C.R. Nicole Kindred, MD [facility] [phone]  08/15/2016, 6:44 AM

## 2016-08-15 NOTE — Progress Notes (Signed)
STROKE TEAM PROGRESS NOTE   HISTORY OF PRESENT ILLNESS (per record) [name] is an 50 y.o. female with a history of hypertension, diabetes mellitus, hyperlipidemia, previous stroke and seizure disorder, presenting with new onset left-sided weakness which started about 8 AM on 05/14/2016. A she has been taking aspirin daily. She went to see her primary doctor who sent her to the ED. CT scan of her head was obtained which was unremarkable. MRI of the brain showed a left paracentral pontine ischemic infarction as well as left posterior internal capsular infarction. 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 Patient had no focal deficits at the time of this evaluation except for mild left lower facial weakness. Patient was not administered IV t-PA secondary to deficits rapidly improved. She was admitted for further evaluation and treatment.   SUBJECTIVE (INTERVAL HISTORY) Her  Husband is at the bedside.  Overall she feels her condition is unchanged.    OBJECTIVE Temp:  [97.3 F (36.3 C)-98 F (36.7 C)] 97.3 F (36.3 C) (11/29 1027) Pulse Rate:  [89-103] 91 (11/29 1027) Cardiac Rhythm: Sinus tachycardia (11/29 0808) Resp:  [9-22] 18 (11/29 1027) BP: (116-153)/(64-108) 121/64 (11/29 1027) SpO2:  [94 %-100 %] 99 % (11/29 1027) Weight:  [106.6 kg (235 lb)-108.1 kg (238 lb 5.1 oz)] 108.1 kg (238 lb 5.1 oz) (11/28 2200)  CBC:   Recent Labs Lab 08/14/16 1244  WBC 5.8  NEUTROABS 3.6  HGB 13.2  HCT 40.6  MCV 61.1*  PLT 149*    Basic Metabolic Panel:   Recent Labs Lab 08/12/16 1351 08/14/16 1244  NA 137 136  K 4.0 4.3  CL 102 103  CO2 26 23  GLUCOSE 306* 311*  BUN 12 16  CREATININE 0.97 0.88  CALCIUM 8.9 9.6    Lipid Panel:     Component Value Date/Time   CHOL 189 08/15/2016 0347   CHOL 215 (H) 12/27/2014 1033   TRIG 125 08/15/2016 0347   TRIG 103 12/27/2014 1033   HDL 43 08/15/2016 0347   HDL 46 12/27/2014 1033   CHOLHDL 4.4 08/15/2016 0347   VLDL 25 08/15/2016 0347    VLDL 21 12/27/2014 1033   LDLCALC 121 (H) 08/15/2016 0347   LDLCALC 148 (H) 12/27/2014 1033   HgbA1c:  Lab Results  Component Value Date   HGBA1C 13.6 (H) 05/12/2016   Urine Drug Screen:     Component Value Date/Time   LABOPIA NONE DETECTED 08/12/2016 1015   COCAINSCRNUR NONE DETECTED 08/12/2016 1015   LABBENZ NONE DETECTED 08/12/2016 1015   AMPHETMU NONE DETECTED 08/12/2016 1015   THCU NONE DETECTED 08/12/2016 1015   LABBARB NONE DETECTED 08/12/2016 1015      IMAGING  Ct Head Wo Contrast 08/14/2016 Extensive chronic ischemic changes in the right MCA territory. Ectatic vasculature versus aneurysm in the right internal carotid artery.   MRI HEAD  08/14/2016 Exam is motion degraded. Acute nonhemorrhagic left paracentral pontine infarct. Acute/ subacute infarct anterior margin of the posterior limb of the left internal capsule. 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 Mild chronic microvascular changes. Global atrophy. Punctate blood breakdown products left lenticular nucleus left thalamus consistent prior episodes of hemorrhagic ischemia. Pituitary gland top-normal size. Mucosal thickening inferior maxillary sinuses measuring up to 7 mm on the left and 3 mm on the right.   MRA HEAD  08/14/2016 Moderate narrowing and irregularity cavernous segment right internal carotid artery. Moderate tandem stenosis M1 segment right middle cerebral artery. Decrease number of visualized right  middle cerebral artery branches consistent with patient's right middle cerebral artery distribution infarct. Vessels which are visualized appear narrowed and irregular. Moderate to marked narrowing proximal A1 segment right anterior cerebral artery. Moderate tandem stenosis A1 segment left anterior cerebral artery. Moderate narrowing mid aspect M1 segment left middle cerebral artery. Left middle cerebral artery moderate branch vessel narrowing and irregularity.  Right vertebral artery ends in a narrowed irregular right posterior inferior cerebellar artery distribution. Ectatic left vertebral artery mild narrowing distally. Nonvisualized left posterior inferior cerebellar artery. Mild irregularity and ectasia basilar artery without high-grade stenosis. Nonvisualized right anterior inferior cerebellar artery. Posterior cerebral artery distal branch vessel mild moderate narrowing greater on the right. No aneurysm noted.      PHYSICAL EXAM Pleasant middle-aged African-American lady currently not in distress. . Afebrile. Head is nontraumatic. Neck is supple without bruit.    Cardiac exam no murmur or gallop. Lungs are clear to auscultation. Distal pulses are well felt. Neuirological Exam :  Awake alert oriented 2. Diminished attention registration and recall. Speech slightly dysarthric no aphasia. Extraocular moments are full range with slight exotropia of the right eye. She blinks to threat on the right but not on the left and is not cooperative for detailed visual field testing. Fundi were not visualized. Mild right lower facial weakness. Tongue is midline. Jaw jerk is brisk. Motor system exam reveals mild right hemiparesis 4/5 strength with weakness of right grip, intrinsic hand muscles right hip flexors and ankle dorsiflexors. Deep tendon reflexes are 2+ symmetric. Both plantars are downgoing. Sensation appears preserved. Gait was not tested. ASSESSMENT/PLAN [name] is a 50 y.o. female with history of hypertension, diabetes mellitus, hyperlipidemia, previous stroke and seizure disorder presenting with L sided weakness. She did not receive IV t-PA due to deficits rapidly improved.   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 Old large R MCA infarct w/ encephalomalacia. Global atrophy. Maxillary sinus mucosal  thickening  MRA  Mod stenosis R ICA, R M1, R A1, L A1, L MCA  Carotid Doppler  pending   2D Echo  pending   LDL 121  HgbA1c 13.6 in Aug  SCDs for VTE prophylaxis Diet heart healthy/carb modified Room service appropriate? Yes; Fluid consistency: Thin  aspirin 325 mg daily prior to admission but not taking routinely, now on aspirin 325 mg daily . Continue aspirin at discharge  Patient counseled to be compliant with her antithrombotic medications  Ongoing aggressive stroke risk factor management  Therapy recommendations:  pending   Disposition:  pending   Hypertension  Stable  Long-term BP goal normotensive  Hyperlipidemia  Home meds:  No statin  LDL 121, goal < 70  Added liptor 40 mg daily  Continue statin at discharge  Diabetes type II  HgbA1c 13.6 in Aug, goal < 7.0  Uncontrolled  Other Stroke Risk Factors  Morbid Obesity, Body mass index is 43.59 kg/m., recommend weight loss, diet and exercise as appropriate   Hx stroke/[name]  11/2015  No stroke ([facility])  10/2015 HA, no stroke Thad Ranger)  03/2014 R brain [name] vs migraine s/p IV tPA Pearlean Brownie)  Other Active Problems  Seizure, on depakote ER 500 daily  Abnormal UA, on rocephin, cx pending  Hx "abnormal behaviors". Psych consult under consideration   Hospital day # 1  Rhoderick Moody [facility] See Amion for Pager information 08/15/2016 11:19  AM  I have personally examined this patient, reviewed notes, independently viewed imaging studies, participated in medical decision making and plan of care.ROS completed by me personally and pertinent positives fully documented  I have made any additions or clarifications directly to the above note. Agree with note above. She has presented with new right-sided weakness secondary to left pontine and internal capsule lacunar infarcts. She has history of remote right temporal parietal infarct in 2014 and suspected small brainstem infarct in 2015. Recommend  continue antiplatelet therapy and husband agrees patient has not been compliant with her medicines and aggressive risk factor modification. Greater than 50% time during this 35 minute visit was spent on counseling and coordination of care regarding recurrent stroke risk, prevention and treatment  Delia HeadyPramod Kisa Fujii, MD Medical Director [facility] Pager: [phone] 08/15/2016 1:44 PM  To contact Stroke Continuity provider, please refer to [email or web address]. After hours, contact General Neurology

## 2016-08-16 ENCOUNTER — Inpatient Hospital Stay (HOSPITAL_COMMUNITY): Payer: BLUE CROSS/BLUE SHIELD

## 2016-08-16 ENCOUNTER — Other Ambulatory Visit (HOSPITAL_COMMUNITY): Payer: BLUE CROSS/BLUE SHIELD

## 2016-08-16 DIAGNOSIS — I6789 Other cerebrovascular disease: Secondary | ICD-10-CM

## 2016-08-16 LAB — GLUCOSE, CAPILLARY
Glucose-Capillary: 253 mg/dL — ABNORMAL HIGH (ref 65–99)
Glucose-Capillary: 316 mg/dL — ABNORMAL HIGH (ref 65–99)
Glucose-Capillary: 325 mg/dL — ABNORMAL HIGH (ref 65–99)

## 2016-08-16 LAB — VAS US CAROTID
LCCAPSYS: 110 cm/s
LEFT ECA DIAS: -14 cm/s
LEFT VERTEBRAL DIAS: -29 cm/s
Left CCA dist dias: 27 cm/s
Left CCA dist sys: 99 cm/s
Left CCA prox dias: 24 cm/s
Left ICA dist dias: -21 cm/s
Left ICA dist sys: -67 cm/s
Left ICA prox dias: -27 cm/s
Left ICA prox sys: -134 cm/s
RCCADSYS: -64 cm/s
RCCAPDIAS: 15 cm/s
RIGHT ECA DIAS: -24 cm/s
RIGHT VERTEBRAL DIAS: 8 cm/s
Right CCA prox sys: 102 cm/s

## 2016-08-16 LAB — URINE CULTURE: Culture: >=100000 — AB

## 2016-08-16 LAB — HEMOGLOBIN A1C
HEMOGLOBIN A1C: 13.3 % — AB (ref 4.8–5.6)
Mean Plasma Glucose: 335 mg/dL

## 2016-08-16 LAB — ECHOCARDIOGRAM COMPLETE
HEIGHTINCHES: 62 in
WEIGHTICAEL: 3813.08 [oz_av]

## 2016-08-16 MED ORDER — DIVALPROEX SODIUM ER 500 MG PO TB24: 500.0000 mg | ORAL_TABLET | Freq: Every day | ORAL | 0 refills | Status: DC

## 2016-08-16 MED ORDER — INSULIN DETEMIR 100 UNIT/ML ~~LOC~~ SOLN
45.0000 [IU] | Freq: Every day | SUBCUTANEOUS | Status: DC
  Filled 2016-08-16: qty 0.45

## 2016-08-16 MED ORDER — INSULIN ASPART 100 UNIT/ML ~~LOC~~ SOLN: 10.0000 [IU] | Freq: Three times a day (TID) | SUBCUTANEOUS | Status: DC

## 2016-08-16 MED ORDER — INSULIN DETEMIR 100 UNIT/ML FLEXPEN
40.0000 [IU] | PEN_INJECTOR | Freq: Every day | SUBCUTANEOUS | 0 refills | Status: DC
Start: 2016-08-16 — End: 2020-03-03

## 2016-08-16 MED ORDER — LISINOPRIL-HYDROCHLOROTHIAZIDE 20-12.5 MG PO TABS: 1.0000 | ORAL_TABLET | Freq: Every day | ORAL | 0 refills | Status: DC

## 2016-08-16 MED ORDER — ASPIRIN 325 MG PO TBEC: 325.0000 mg | DELAYED_RELEASE_TABLET | Freq: Every day | ORAL | 0 refills | Status: DC

## 2016-08-16 MED ORDER — GLIPIZIDE ER 5 MG PO TB24: 5.0000 mg | ORAL_TABLET | Freq: Every day | ORAL | 0 refills | Status: DC

## 2016-08-16 MED ORDER — METFORMIN HCL 500 MG PO TABS: 500.0000 mg | ORAL_TABLET | Freq: Two times a day (BID) | ORAL | 0 refills | Status: DC

## 2016-08-16 MED ORDER — ATORVASTATIN CALCIUM 40 MG PO TABS: 40.0000 mg | ORAL_TABLET | Freq: Every day | ORAL | 0 refills | Status: AC

## 2016-08-16 MED ORDER — INSULIN ASPART 100 UNIT/ML FLEXPEN: 10.0000 [IU] | PEN_INJECTOR | Freq: Three times a day (TID) | SUBCUTANEOUS | 0 refills | Status: DC

## 2016-08-16 NOTE — Progress Notes (Signed)
STROKE TEAM PROGRESS NOTE   HISTORY OF PRESENT ILLNESS (per record) [name] is an 50 y.o. female with a history of hypertension, diabetes mellitus, hyperlipidemia, previous stroke and seizure disorder, presenting with new onset left-sided weakness which started about 8 AM on 05/14/2016. A she has been taking aspirin daily. She went to see her primary doctor who sent her to the ED. CT scan of her head was obtained which was unremarkable. MRI of the brain showed a left paracentral pontine ischemic infarction as well as left posterior internal capsular infarction. 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 Patient had no focal deficits at the time of this evaluation except for mild left lower facial weakness. Patient was not administered IV t-PA secondary to deficits rapidly improved. She was admitted for further evaluation and treatment.   SUBJECTIVE (INTERVAL HISTORY) Her  Husband is at the bedside.  Overall she feels her condition is unchanged.    OBJECTIVE Temp:  [97.8 F (36.6 C)-98.4 F (36.9 C)] 98.4 F (36.9 C) (11/30 1341) Pulse Rate:  [80-103] 99 (11/30 1341) Cardiac Rhythm: Normal sinus rhythm (11/30 0700) Resp:  [20] 20 (11/30 1341) BP: (110-143)/(63-82) 133/72 (11/30 1341) SpO2:  [97 %-100 %] 99 % (11/30 1341)  CBC:   Recent Labs Lab 08/14/16 1244  WBC 5.8  NEUTROABS 3.6  HGB 13.2  HCT 40.6  MCV 61.1*  PLT 149*    Basic Metabolic Panel:   Recent Labs Lab 08/12/16 1351 08/14/16 1244  NA 137 136  K 4.0 4.3  CL 102 103  CO2 26 23  GLUCOSE 306* 311*  BUN 12 16  CREATININE 0.97 0.88  CALCIUM 8.9 9.6    Lipid Panel:     Component Value Date/Time   CHOL 189 08/15/2016 0347   CHOL 215 (H) 12/27/2014 1033   TRIG 125 08/15/2016 0347   TRIG 103 12/27/2014 1033   HDL 43 08/15/2016 0347   HDL 46 12/27/2014 1033   CHOLHDL 4.4 08/15/2016 0347   VLDL 25 08/15/2016 0347   VLDL 21 12/27/2014 1033   LDLCALC 121 (H) 08/15/2016 0347   LDLCALC 148 (H) 12/27/2014  1033   HgbA1c:  Lab Results  Component Value Date   HGBA1C 13.3 (H) 08/15/2016   Urine Drug Screen:     Component Value Date/Time   LABOPIA NONE DETECTED 08/12/2016 1015   COCAINSCRNUR NONE DETECTED 08/12/2016 1015   LABBENZ NONE DETECTED 08/12/2016 1015   AMPHETMU NONE DETECTED 08/12/2016 1015   THCU NONE DETECTED 08/12/2016 1015   LABBARB NONE DETECTED 08/12/2016 1015      IMAGING  Ct Head Wo Contrast 08/14/2016 Extensive chronic ischemic changes in the right MCA territory. Ectatic vasculature versus aneurysm in the right internal carotid artery.   MRI HEAD  08/14/2016 Exam is motion degraded. Acute nonhemorrhagic left paracentral pontine infarct. Acute/ subacute infarct anterior margin of the posterior limb of the left internal capsule. 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 Mild chronic microvascular changes. Global atrophy. Punctate blood breakdown products left lenticular nucleus left thalamus consistent prior episodes of hemorrhagic ischemia. Pituitary gland top-normal size. Mucosal thickening inferior maxillary sinuses measuring up to 7 mm on the left and 3 mm on the right.   MRA HEAD  08/14/2016 Moderate narrowing and irregularity cavernous segment right internal carotid artery. Moderate tandem stenosis M1 segment right middle cerebral artery. Decrease number of visualized right middle cerebral artery branches consistent with patient's right middle cerebral artery distribution infarct. Vessels which are visualized appear  narrowed and irregular. Moderate to marked narrowing proximal A1 segment right anterior cerebral artery. Moderate tandem stenosis A1 segment left anterior cerebral artery. Moderate narrowing mid aspect M1 segment left middle cerebral artery. Left middle cerebral artery moderate branch vessel narrowing and irregularity. Right vertebral artery ends in a narrowed irregular right posterior inferior cerebellar artery  distribution. Ectatic left vertebral artery mild narrowing distally. Nonvisualized left posterior inferior cerebellar artery. Mild irregularity and ectasia basilar artery without high-grade stenosis. Nonvisualized right anterior inferior cerebellar artery. Posterior cerebral artery distal branch vessel mild moderate narrowing greater on the right. No aneurysm noted.      PHYSICAL EXAM Pleasant middle-aged African-American lady currently not in distress. . Afebrile. Head is nontraumatic. Neck is supple without bruit.    Cardiac exam no murmur or gallop. Lungs are clear to auscultation. Distal pulses are well felt. Neuirological Exam :  Awake alert oriented 2. Diminished attention registration and recall. Speech slightly dysarthric no aphasia. Extraocular moments are full range with slight exotropia of the right eye. She blinks to threat on the right but not on the left and is not cooperative for detailed visual field testing. Fundi were not visualized. Mild right lower facial weakness. Tongue is midline. Jaw jerk is brisk. Motor system exam reveals mild right hemiparesis 4/5 strength with weakness of right grip, intrinsic hand muscles right hip flexors and ankle dorsiflexors. Deep tendon reflexes are 2+ symmetric. Both plantars are downgoing. Sensation appears preserved. Gait was not tested. ASSESSMENT/PLAN [name] is a 50 y.o. female with history of hypertension, diabetes mellitus, hyperlipidemia, previous stroke and seizure disorder presenting with L sided weakness. She did not receive IV t-PA due to deficits rapidly improved.   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 Old large R MCA infarct w/ encephalomalacia. Global atrophy. Maxillary sinus mucosal thickening  MRA  Mod stenosis R ICA, R M1, R A1, L A1, L MCA  Carotid Doppler   1-39 % bilateral carotid  stenosis  2D Echo  pending   LDL 121  HgbA1c 13.6 in Aug  SCDs for VTE prophylaxis Diet heart healthy/carb modified Room service appropriate? Yes; Fluid consistency: Thin  aspirin 325 mg daily prior to admission but not taking routinely, now on aspirin 325 mg daily . Continue aspirin at discharge  Patient counseled to be compliant with her antithrombotic medications  Ongoing aggressive stroke risk factor management  Therapy recommendations:  Home PT/OT  Disposition:  home Hypertension  Stable  Long-term BP goal normotensive  Hyperlipidemia  Home meds:  No statin  LDL 121, goal < 70  Added liptor 40 mg daily  Continue statin at discharge  Diabetes type II  HgbA1c 13.6 in Aug, goal < 7.0  Uncontrolled  Other Stroke Risk Factors  Morbid Obesity, Body mass index is 43.59 kg/m., recommend weight loss, diet and exercise as appropriate   Hx stroke/[name]  11/2015  No stroke ([facility])  10/2015 HA, no stroke Thad Ranger([facility])  03/2014 R brain [name] vs migraine s/p IV tPA Pearlean Brownie([place])  Other Active Problems  Seizure, on depakote ER 500 daily  Abnormal UA, on rocephin, cx  ecoli  Hx "abnormal behaviors". Psych consult under consideration   Hospital day # 2    I have personally examined this patient, reviewed notes, independently viewed imaging studies, participated in medical decision making and plan of care.ROS completed by me personally and  pertinent positives fully documented  I have made any additions or clarifications directly to the above note. Agree with note above. She has presented with new right-sided weakness secondary to left pontine and internal capsule lacunar infarcts. She has history of remote right temporal parietal infarct in 2014 and suspected small brainstem infarct in 2015. Recommend continue antiplatelet therapy and husband agrees patient has not been compliant with her medicines and aggressive risk factor modification. Greater than 50% time during this 25  minute visit was spent on counseling and coordination of care regarding recurrent stroke risk, prevention and treatment.Check Echo and dc home. F/u as outpatient in 6 weeks in stroke clinic  Delia HeadyPramod Sethi, MD Medical Director [facility] Pager: [phone] 08/16/2016 3:06 PM  To contact Stroke Continuity provider, please refer to [email or web address]. After hours, contact General Neurology

## 2016-08-16 NOTE — Progress Notes (Signed)
  Echocardiogram 2D Echocardiogram has been performed.  Nolon RodBrown, Tony 08/16/2016, 4:11 PM

## 2016-08-16 NOTE — Progress Notes (Signed)
VASCULAR LAB PRELIMINARY  PRELIMINARY  PRELIMINARY  PRELIMINARY  Carotid duplex completed.    Preliminary report:  1-39% ICA plaquing. Vertebral artery flow is antegrade.   Ryenne Lynam, RVT 08/16/2016, 2:40 PM

## 2016-08-16 NOTE — Progress Notes (Signed)
0330 assessment patient was asleep and did not cooperate with assessment. She did respond to pain stimulation but facial expression was frown. There was eye movement under eye lids. [name] SkillVeronica Abeera Flannery LPN.

## 2016-08-16 NOTE — Discharge Summary (Signed)
PATIENT DETAILS Name: Beatrix Shipperndrea Entsminger Age: 50 y.o. Sex: female Date of Birth: 04/02/1966 MRN: [id number]. Admitting Physician: Michael LitterNikki Carter, MD NWG:[id number]:[facility]  Admit Date: 08/14/2016 Discharge date: 08/16/2016  Recommendations for Outpatient Follow-up:  1. Follow up with PCP in 1-2 weeks 2. Ensure follow-up at the stroke clinic 3. Please counseled regarding importance of compliance to medications and to follow-up 4. Please obtain BMP/CBC in one week 5. Repeat lipid panel in 3 months  Admitted From:  Home   Disposition: Home with home health services   Home Health: Yes  Equipment/Devices: None  Discharge Condition: Stable  CODE STATUS: FULL CODE  Diet recommendation:  Heart Healthy / Carb Modified   Brief Summary: See H&P, Labs, Consult and Test reports for all details in brief,Patient is a 50 y.o. female with prior history of CVA, dyslipidemia, hypertension, seizure disorder who was admitted for evaluation of slurred speech and left facial droop. MRI of the brain demonstrates a left paracentral pontine infarct. 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 Has mild right-sided deficits on exam. Carotid Doppler negative for significant stenosis, echocardiogram showed preserved EF without any embolic source.  A1c 13.3, LDL 121. Recommendations are to continue aspirin. Please ensure follow-up with neurology. Patient has been counseled extensively regarding importance of compliance to medications.   Hypertension: Continue lisinopril and HCTZ. Further optimization deferred to the outpatient setting  Dyslipidemia: Continue statin  Escherichia coli UTI: Managed with intravenous Rocephin while in the hospital, it appears that the patient was already on Bactrim prior to this admission since 11/27. Patient has already completed more than 3 days of antimicrobial therapy. I do  not think she requires any further antimicrobial therapy, she is afebrile and nontoxic looking.  Uncontrolled Insulin-dependent diabetes with hyperglycemia: CBGs uncontrolled, Levemir will be increased to 40 units, continue NovoLog 10 units with meals. Patient unfortunately has not been compliant-spouse thinks that she may have run out of insulin recently. A1c is 13.3. Further optimization deferred to the outpatient setting.  History of seizures: Continue Depakote.  History of depression: Recently presented to the ED in [place] on 11/26 with bizarre behavior-and was evaluated by psychiatry-not felt to require inpatient psychiatric admission. During my evaluation, she was awake and alert and answering all my questions appropriately. Per spouse, she is back to her usual baseline. Denied any suicidal or homicidal ideation  Noncompliance to medications: Per spouse, she has been noncompliant to medications in the past-he thinks that some of her medications may have run out.  Morbid obesity: Counseled  Procedures/Studies: None  Discharge Diagnoses:  Principal Problem:   Cerebral infarction [facility](HCC) Active Problems:   HTN (hypertension)   Diabetes (HCC)   Hyperlipidemia   Severe obesity (BMI >= 40) (HCC)   Epilepsy (HCC)   Acute CVA (cerebrovascular accident) [facility](HCC)   Discharge Instructions:  Activity:  As tolerated with Full fall precautions use walker/cane & assistance as needed  Discharge Instructions    Ambulatory referral to Neurology    Complete by:  As directed    Diet - low sodium heart healthy    Complete by:  As directed    Diet Carb Modified    Complete by:  As directed    Discharge instructions    Complete by:  As directed    Follow with Primary MD  [facility]  and neurology as instructed  Please get a complete blood count and chemistry  panel checked by your Primary MD at your next visit, and again as instructed by your Primary MD.  Get Medicines  reviewed and adjusted: Please take all your medications with you for your next visit with your Primary MD  Laboratory/radiological data: Please request your Primary MD to go over all hospital tests and procedure/radiological results at the follow up, please ask your Primary MD to get all Hospital records sent to his/her office.  In some cases, they will be blood work, cultures and biopsy results pending at the time of your discharge. Please request that your primary care M.D. follows up on these results.  Also Note the following: If you experience worsening of your admission symptoms, develop shortness of breath, life threatening emergency, suicidal or homicidal thoughts you must seek medical attention immediately by calling 911 or calling your MD immediately  if symptoms less severe.  You must read complete instructions/literature along with all the possible adverse reactions/side effects for all the Medicines you take and that have been prescribed to you. Take any new Medicines after you have completely understood and accpet all the possible adverse reactions/side effects.   Do not drive when taking Pain medications or sleeping medications (Benzodaizepines)  Do not take more than prescribed Pain, Sleep and Anxiety Medications. It is not advisable to combine anxiety,sleep and pain medications without talking with your primary care practitioner  Special Instructions: If you have smoked or chewed Tobacco  in the last 2 yrs please stop smoking, stop any regular Alcohol  and or any Recreational drug use.  Wear Seat belts while driving.  Please note: You were cared for by a hospitalist during your hospital stay. Once you are discharged, your primary care physician will handle any further medical issues. Please note that NO REFILLS for any discharge medications will be authorized once you are discharged, as it is imperative that you return to your primary care physician (or establish a relationship  with a primary care physician if you do not have one) for your post hospital discharge needs so that they can reassess your need for medications and monitor your lab values.   Increase activity slowly    Complete by:  As directed        Medication List    STOP taking these medications   cyclobenzaprine 5 MG tablet Commonly known as:  FLEXERIL   sulfamethoxazole-trimethoprim 800-160 MG tablet Commonly known as:  BACTRIM DS,SEPTRA DS     TAKE these medications   aspirin 325 MG EC tablet Take 1 tablet (325 mg total) by mouth daily.   atorvastatin 40 MG tablet Commonly known as:  LIPITOR Take 1 tablet (40 mg total) by mouth daily at 6 PM.   butalbital-acetaminophen-caffeine 50-325-40 MG tablet Commonly known as:  FIORICET, ESGIC Take 1-2 tablets by mouth every 6 (six) hours as needed for headache.   divalproex 500 MG 24 hr tablet Commonly known as:  DEPAKOTE ER Take 1 tablet (500 mg total) by mouth daily.   etodolac 400 MG tablet Commonly known as:  LODINE Take 400 mg by mouth daily as needed.   glipiZIDE 5 MG 24 hr tablet Commonly known as:  GLUCOTROL XL Take 1 tablet (5 mg total) by mouth daily.   insulin aspart 100 UNIT/ML FlexPen Commonly known as:  NOVOLOG FLEXPEN Inject 10 Units into the skin 3 (three) times daily with meals.   Insulin Detemir 100 UNIT/ML Pen Commonly known as:  LEVEMIR FLEXPEN Inject 40 Units into the skin daily at 10  pm. What changed:  how much to take   lisinopril-hydrochlorothiazide 20-12.5 MG tablet Commonly known as:  PRINZIDE,ZESTORETIC Take 1 tablet by mouth daily.   metFORMIN 500 MG tablet Commonly known as:  GLUCOPHAGE Take 1 tablet (500 mg total) by mouth 2 (two) times daily with a meal.            Durable Medical Equipment        Start     Ordered   08/16/16 1540  For home use only DME 3 n 1  Once     08/16/16 1539     Follow-up Information    [facility]. Schedule an appointment as soon as possible for  a visit in 2 week(s).   Specialty:  General Practice Contact information: [address] [place] [address] [phone]        SETHI,PRAMOD, MD. Schedule an appointment as soon as possible for a visit in 2 month(s).   Specialties:  Neurology, Radiology Why:  Office will call you with a follow-up appointment, if you do not hear from them- please give them a call Contact information: [address] [place] [address] [phone]          No Known Allergies  Consultations:   neurology  Other Procedures/Studies: Ct Head Wo Contrast  Result Date: 08/14/2016 CLINICAL DATA:  Left-sided weakness and slurred speech. EXAM: CT HEAD WITHOUT CONTRAST TECHNIQUE: Contiguous axial images were obtained from the base of the skull through the vertex without intravenous contrast. COMPARISON:  None. FINDINGS: Brain: There is extensive encephalomalacia in the right parietal and anterior temporal lobes. There is ex vacuo dilatation of the right lateral ventricle. There are chronic ischemic changes in the periventricular white matter. There is also global atrophy. There is no mass effect or midline shift. No evidence of hemorrhage. Vascular: There is a calcified soft tissue mass in the region of the right carotid terminus. Aneurysm is not excluded. This may simply represent ectatic vasculature and atherosclerotic calcification. Skull: Cranium is intact. Sinuses/Orbits: Minimal mucus material layers in the right maxillary sinus. Remainder the visualized paranasal sinuses are clear. Mastoid air cells are clear. Other: None. IMPRESSION: Extensive chronic ischemic changes in the right MCA territory. Ectatic vasculature versus aneurysm in the right internal carotid artery. CT angiogram may be helpful. Electronically Signed   By: Jolaine Click M.D.   On: 08/14/2016 13:14   Mr Maxine Glenn Head Wo Contrast  Result Date: 08/14/2016 CLINICAL DATA:  50 year old hypertensive diabetic female  with hyperlipidemia and history of infarct and seizures presenting with left-sided weakness. Subsequent encounter. EXAM: MRI HEAD WITHOUT CONTRAST MRA HEAD WITHOUT CONTRAST TECHNIQUE: Multiplanar, multiecho pulse sequences of the brain and surrounding structures were obtained without intravenous contrast. Angiographic images of the head were obtained using MRA technique without contrast. COMPARISON:  08/14/2016 head CT.  No comparison brain MR. FINDINGS: MRI HEAD FINDINGS Exam is motion degraded. Brain: Acute nonhemorrhagic left paracentral pontine infarct. Acute/ subacute infarct anterior margin of the posterior limb of the left internal capsule. Remote large right middle cerebral artery distribution infarct with encephalomalacia right temporal lobe, right parietal lobe and posterior right frontal lobe with subsequent dilation of the right lateral ventricle. Wallerian degeneration of the right cerebral peduncle. Mild chronic microvascular changes. Global atrophy without hydrocephalus. Punctate blood breakdown products left lenticular nucleus left thalamus consistent prior episodes hemorrhagic ischemia. Pituitary gland top-normal size measuring up to 7.8 mm with slightly convex superior margin without suprasellar extension. Vascular: As below. Skull  and upper cervical spine: No acute abnormality. Sinuses/Orbits: Mucosal thickening inferior maxillary sinuses measuring up to 7 mm on the left and 3 mm on the right. No acute orbital abnormality. Other: Negative MRA HEAD FINDINGS Moderate narrowing and irregularity cavernous segment right internal carotid artery. Moderate tandem stenosis M1 segment right middle cerebral artery. Decrease number of visualized right middle cerebral artery branches consistent with patient's right middle cerebral artery distribution infarct. Vessels which are visualized appear narrowed and irregular. Moderate to marked narrowing proximal A1 segment right anterior cerebral artery. Moderate  tandem stenosis A1 segment left anterior cerebral artery. Moderate narrowing mid aspect M1 segment left middle cerebral artery. Left middle cerebral artery moderate branch vessel narrowing and irregularity. Right vertebral artery ends in a narrowed irregular right posterior inferior cerebellar artery distribution. Ectatic left vertebral artery mild narrowing distally. Nonvisualized left posterior inferior cerebellar artery. Mild irregularity in ectasia basilar artery without high-grade stenosis. Nonvisualized right anterior inferior cerebellar artery. Posterior cerebral artery distal branch vessel mild moderate narrowing greater on the right. No aneurysm noted. IMPRESSION: MRI HEAD Exam is motion degraded. Acute nonhemorrhagic left paracentral pontine infarct. Acute/ subacute infarct anterior margin of the posterior limb of the left internal capsule. 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 Mild chronic microvascular changes. Global atrophy. Punctate blood breakdown products left lenticular nucleus left thalamus consistent prior episodes of hemorrhagic ischemia. Pituitary gland top-normal size. Mucosal thickening inferior maxillary sinuses measuring up to 7 mm on the left and 3 mm on the right. MRA HEAD Moderate narrowing and irregularity cavernous segment right internal carotid artery. Moderate tandem stenosis M1 segment right middle cerebral artery. Decrease number of visualized right middle cerebral artery branches consistent with patient's right middle cerebral artery distribution infarct. Vessels which are visualized appear narrowed and irregular. Moderate to marked narrowing proximal A1 segment right anterior cerebral artery. Moderate tandem stenosis A1 segment left anterior cerebral artery. Moderate narrowing mid aspect M1 segment left middle cerebral artery. Left middle cerebral artery moderate branch vessel narrowing and irregularity. Right vertebral artery ends  in a narrowed irregular right posterior inferior cerebellar artery distribution. Ectatic left vertebral artery mild narrowing distally. Nonvisualized left posterior inferior cerebellar artery. Mild irregularity and ectasia basilar artery without high-grade stenosis. Nonvisualized right anterior inferior cerebellar artery. Posterior cerebral artery distal branch vessel mild moderate narrowing greater on the right. No aneurysm noted. Electronically Signed   By: Lacy Duverney M.D.   On: 08/14/2016 18:55   Mr Brain Wo Contrast  Result Date: 08/14/2016 CLINICAL DATA:  50 year old hypertensive diabetic female with hyperlipidemia and history of infarct and seizures presenting with left-sided weakness. Subsequent encounter. EXAM: MRI HEAD WITHOUT CONTRAST MRA HEAD WITHOUT CONTRAST TECHNIQUE: Multiplanar, multiecho pulse sequences of the brain and surrounding structures were obtained without intravenous contrast. Angiographic images of the head were obtained using MRA technique without contrast. COMPARISON:  08/14/2016 head CT.  No comparison brain MR. FINDINGS: MRI HEAD FINDINGS Exam is motion degraded. Brain: Acute nonhemorrhagic left paracentral pontine infarct. Acute/ subacute infarct anterior margin of the posterior limb of the left internal capsule. Remote large right middle cerebral artery distribution infarct with encephalomalacia right temporal lobe, right parietal lobe and posterior right frontal lobe with subsequent dilation of the right lateral ventricle. Wallerian degeneration of the right cerebral peduncle. Mild chronic microvascular changes. Global atrophy without hydrocephalus. Punctate blood breakdown products left lenticular nucleus left thalamus consistent prior episodes hemorrhagic ischemia. Pituitary gland top-normal size measuring up to 7.8 mm with slightly convex superior margin  without suprasellar extension. Vascular: As below. Skull and upper cervical spine: No acute abnormality. Sinuses/Orbits:  Mucosal thickening inferior maxillary sinuses measuring up to 7 mm on the left and 3 mm on the right. No acute orbital abnormality. Other: Negative MRA HEAD FINDINGS Moderate narrowing and irregularity cavernous segment right internal carotid artery. Moderate tandem stenosis M1 segment right middle cerebral artery. Decrease number of visualized right middle cerebral artery branches consistent with patient's right middle cerebral artery distribution infarct. Vessels which are visualized appear narrowed and irregular. Moderate to marked narrowing proximal A1 segment right anterior cerebral artery. Moderate tandem stenosis A1 segment left anterior cerebral artery. Moderate narrowing mid aspect M1 segment left middle cerebral artery. Left middle cerebral artery moderate branch vessel narrowing and irregularity. Right vertebral artery ends in a narrowed irregular right posterior inferior cerebellar artery distribution. Ectatic left vertebral artery mild narrowing distally. Nonvisualized left posterior inferior cerebellar artery. Mild irregularity in ectasia basilar artery without high-grade stenosis. Nonvisualized right anterior inferior cerebellar artery. Posterior cerebral artery distal branch vessel mild moderate narrowing greater on the right. No aneurysm noted. IMPRESSION: MRI HEAD Exam is motion degraded. Acute nonhemorrhagic left paracentral pontine infarct. Acute/ subacute infarct anterior margin of the posterior limb of the left internal capsule. 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 Mild chronic microvascular changes. Global atrophy. Punctate blood breakdown products left lenticular nucleus left thalamus consistent prior episodes of hemorrhagic ischemia. Pituitary gland top-normal size. Mucosal thickening inferior maxillary sinuses measuring up to 7 mm on the left and 3 mm on the right. MRA HEAD Moderate narrowing and irregularity cavernous segment right internal  carotid artery. Moderate tandem stenosis M1 segment right middle cerebral artery. Decrease number of visualized right middle cerebral artery branches consistent with patient's right middle cerebral artery distribution infarct. Vessels which are visualized appear narrowed and irregular. Moderate to marked narrowing proximal A1 segment right anterior cerebral artery. Moderate tandem stenosis A1 segment left anterior cerebral artery. Moderate narrowing mid aspect M1 segment left middle cerebral artery. Left middle cerebral artery moderate branch vessel narrowing and irregularity. Right vertebral artery ends in a narrowed irregular right posterior inferior cerebellar artery distribution. Ectatic left vertebral artery mild narrowing distally. Nonvisualized left posterior inferior cerebellar artery. Mild irregularity and ectasia basilar artery without high-grade stenosis. Nonvisualized right anterior inferior cerebellar artery. Posterior cerebral artery distal branch vessel mild moderate narrowing greater on the right. No aneurysm noted. Electronically Signed   By: Lacy Duverney M.D.   On: 08/14/2016 18:55      TODAY-DAY OF DISCHARGE:  Subjective:   Ahlani Wickes today has no headache,no chest abdominal pain,no new weakness tingling or numbness, feels much better wants to go home today.   Objective:   Blood pressure 133/72, pulse 99, temperature 98.4 F (36.9 C), temperature source Oral, resp. rate 20, height 5\' 2"  (1.575 m), weight 108.1 kg (238 lb 5.1 oz), last menstrual period 07/17/2016, SpO2 99 %.  Intake/Output Summary (Last 24 hours) at 08/16/16 1634 Last data filed at 08/16/16 1352  Gross per 24 hour  Intake              240 ml  Output                0 ml  Net              240 ml   Filed Weights   08/14/16 1210 08/14/16 2200  Weight: 106.6 kg (235 lb) 108.1 kg (238 lb 5.1 oz)  Exam: Awake Alert, Oriented *3, No new F.N deficits, Normal affect [place].AT,PERRAL Supple Neck,No JVD, No  cervical lymphadenopathy appriciated.  Symmetrical Chest wall movement, Good air movement bilaterally, CTAB RRR,No Gallops,Rubs or new Murmurs, No Parasternal Heave +ve B.Sounds, Abd Soft, Non tender, No organomegaly appriciated, No rebound -guarding or rigidity. No Cyanosis, Clubbing or edema, No new Rash or bruise   PERTINENT RADIOLOGIC STUDIES: Ct Head Wo Contrast  Result Date: 08/14/2016 CLINICAL DATA:  Left-sided weakness and slurred speech. EXAM: CT HEAD WITHOUT CONTRAST TECHNIQUE: Contiguous axial images were obtained from the base of the skull through the vertex without intravenous contrast. COMPARISON:  None. FINDINGS: Brain: There is extensive encephalomalacia in the right parietal and anterior temporal lobes. There is ex vacuo dilatation of the right lateral ventricle. There are chronic ischemic changes in the periventricular white matter. There is also global atrophy. There is no mass effect or midline shift. No evidence of hemorrhage. Vascular: There is a calcified soft tissue mass in the region of the right carotid terminus. Aneurysm is not excluded. This may simply represent ectatic vasculature and atherosclerotic calcification. Skull: Cranium is intact. Sinuses/Orbits: Minimal mucus material layers in the right maxillary sinus. Remainder the visualized paranasal sinuses are clear. Mastoid air cells are clear. Other: None. IMPRESSION: Extensive chronic ischemic changes in the right MCA territory. Ectatic vasculature versus aneurysm in the right internal carotid artery. CT angiogram may be helpful. Electronically Signed   By: Jolaine Click M.D.   On: 08/14/2016 13:14   Mr Maxine Glenn Head Wo Contrast  Result Date: 08/14/2016 CLINICAL DATA:  50 year old hypertensive diabetic female with hyperlipidemia and history of infarct and seizures presenting with left-sided weakness. Subsequent encounter. EXAM: MRI HEAD WITHOUT CONTRAST MRA HEAD WITHOUT CONTRAST TECHNIQUE: Multiplanar, multiecho pulse  sequences of the brain and surrounding structures were obtained without intravenous contrast. Angiographic images of the head were obtained using MRA technique without contrast. COMPARISON:  08/14/2016 head CT.  No comparison brain MR. FINDINGS: MRI HEAD FINDINGS Exam is motion degraded. Brain: Acute nonhemorrhagic left paracentral pontine infarct. Acute/ subacute infarct anterior margin of the posterior limb of the left internal capsule. Remote large right middle cerebral artery distribution infarct with encephalomalacia right temporal lobe, right parietal lobe and posterior right frontal lobe with subsequent dilation of the right lateral ventricle. Wallerian degeneration of the right cerebral peduncle. Mild chronic microvascular changes. Global atrophy without hydrocephalus. Punctate blood breakdown products left lenticular nucleus left thalamus consistent prior episodes hemorrhagic ischemia. Pituitary gland top-normal size measuring up to 7.8 mm with slightly convex superior margin without suprasellar extension. Vascular: As below. Skull and upper cervical spine: No acute abnormality. Sinuses/Orbits: Mucosal thickening inferior maxillary sinuses measuring up to 7 mm on the left and 3 mm on the right. No acute orbital abnormality. Other: Negative MRA HEAD FINDINGS Moderate narrowing and irregularity cavernous segment right internal carotid artery. Moderate tandem stenosis M1 segment right middle cerebral artery. Decrease number of visualized right middle cerebral artery branches consistent with patient's right middle cerebral artery distribution infarct. Vessels which are visualized appear narrowed and irregular. Moderate to marked narrowing proximal A1 segment right anterior cerebral artery. Moderate tandem stenosis A1 segment left anterior cerebral artery. Moderate narrowing mid aspect M1 segment left middle cerebral artery. Left middle cerebral artery moderate branch vessel narrowing and irregularity. Right  vertebral artery ends in a narrowed irregular right posterior inferior cerebellar artery distribution. Ectatic left vertebral artery mild narrowing distally. Nonvisualized left posterior inferior cerebellar artery. Mild irregularity in ectasia basilar artery without  high-grade stenosis. Nonvisualized right anterior inferior cerebellar artery. Posterior cerebral artery distal branch vessel mild moderate narrowing greater on the right. No aneurysm noted. IMPRESSION: MRI HEAD Exam is motion degraded. Acute nonhemorrhagic left paracentral pontine infarct. Acute/ subacute infarct anterior margin of the posterior limb of the left internal capsule. 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 Mild chronic microvascular changes. Global atrophy. Punctate blood breakdown products left lenticular nucleus left thalamus consistent prior episodes of hemorrhagic ischemia. Pituitary gland top-normal size. Mucosal thickening inferior maxillary sinuses measuring up to 7 mm on the left and 3 mm on the right. MRA HEAD Moderate narrowing and irregularity cavernous segment right internal carotid artery. Moderate tandem stenosis M1 segment right middle cerebral artery. Decrease number of visualized right middle cerebral artery branches consistent with patient's right middle cerebral artery distribution infarct. Vessels which are visualized appear narrowed and irregular. Moderate to marked narrowing proximal A1 segment right anterior cerebral artery. Moderate tandem stenosis A1 segment left anterior cerebral artery. Moderate narrowing mid aspect M1 segment left middle cerebral artery. Left middle cerebral artery moderate branch vessel narrowing and irregularity. Right vertebral artery ends in a narrowed irregular right posterior inferior cerebellar artery distribution. Ectatic left vertebral artery mild narrowing distally. Nonvisualized left posterior inferior cerebellar artery. Mild irregularity  and ectasia basilar artery without high-grade stenosis. Nonvisualized right anterior inferior cerebellar artery. Posterior cerebral artery distal branch vessel mild moderate narrowing greater on the right. No aneurysm noted. Electronically Signed   By: Lacy DuverneySteven  Olson M.D.   On: 08/14/2016 18:55   Mr Brain Wo Contrast  Result Date: 08/14/2016 CLINICAL DATA:  50 year old hypertensive diabetic female with hyperlipidemia and history of infarct and seizures presenting with left-sided weakness. Subsequent encounter. EXAM: MRI HEAD WITHOUT CONTRAST MRA HEAD WITHOUT CONTRAST TECHNIQUE: Multiplanar, multiecho pulse sequences of the brain and surrounding structures were obtained without intravenous contrast. Angiographic images of the head were obtained using MRA technique without contrast. COMPARISON:  08/14/2016 head CT.  No comparison brain MR. FINDINGS: MRI HEAD FINDINGS Exam is motion degraded. Brain: Acute nonhemorrhagic left paracentral pontine infarct. Acute/ subacute infarct anterior margin of the posterior limb of the left internal capsule. Remote large right middle cerebral artery distribution infarct with encephalomalacia right temporal lobe, right parietal lobe and posterior right frontal lobe with subsequent dilation of the right lateral ventricle. Wallerian degeneration of the right cerebral peduncle. Mild chronic microvascular changes. Global atrophy without hydrocephalus. Punctate blood breakdown products left lenticular nucleus left thalamus consistent prior episodes hemorrhagic ischemia. Pituitary gland top-normal size measuring up to 7.8 mm with slightly convex superior margin without suprasellar extension. Vascular: As below. Skull and upper cervical spine: No acute abnormality. Sinuses/Orbits: Mucosal thickening inferior maxillary sinuses measuring up to 7 mm on the left and 3 mm on the right. No acute orbital abnormality. Other: Negative MRA HEAD FINDINGS Moderate narrowing and irregularity  cavernous segment right internal carotid artery. Moderate tandem stenosis M1 segment right middle cerebral artery. Decrease number of visualized right middle cerebral artery branches consistent with patient's right middle cerebral artery distribution infarct. Vessels which are visualized appear narrowed and irregular. Moderate to marked narrowing proximal A1 segment right anterior cerebral artery. Moderate tandem stenosis A1 segment left anterior cerebral artery. Moderate narrowing mid aspect M1 segment left middle cerebral artery. Left middle cerebral artery moderate branch vessel narrowing and irregularity. Right vertebral artery ends in a narrowed irregular right posterior inferior cerebellar artery distribution. Ectatic left vertebral artery mild narrowing distally. Nonvisualized left posterior inferior cerebellar artery.  Mild irregularity in ectasia basilar artery without high-grade stenosis. Nonvisualized right anterior inferior cerebellar artery. Posterior cerebral artery distal branch vessel mild moderate narrowing greater on the right. No aneurysm noted. IMPRESSION: MRI HEAD Exam is motion degraded. Acute nonhemorrhagic left paracentral pontine infarct. Acute/ subacute infarct anterior margin of the posterior limb of the left internal capsule. 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 Mild chronic microvascular changes. Global atrophy. Punctate blood breakdown products left lenticular nucleus left thalamus consistent prior episodes of hemorrhagic ischemia. Pituitary gland top-normal size. Mucosal thickening inferior maxillary sinuses measuring up to 7 mm on the left and 3 mm on the right. MRA HEAD Moderate narrowing and irregularity cavernous segment right internal carotid artery. Moderate tandem stenosis M1 segment right middle cerebral artery. Decrease number of visualized right middle cerebral artery branches consistent with patient's right middle cerebral  artery distribution infarct. Vessels which are visualized appear narrowed and irregular. Moderate to marked narrowing proximal A1 segment right anterior cerebral artery. Moderate tandem stenosis A1 segment left anterior cerebral artery. Moderate narrowing mid aspect M1 segment left middle cerebral artery. Left middle cerebral artery moderate branch vessel narrowing and irregularity. Right vertebral artery ends in a narrowed irregular right posterior inferior cerebellar artery distribution. Ectatic left vertebral artery mild narrowing distally. Nonvisualized left posterior inferior cerebellar artery. Mild irregularity and ectasia basilar artery without high-grade stenosis. Nonvisualized right anterior inferior cerebellar artery. Posterior cerebral artery distal branch vessel mild moderate narrowing greater on the right. No aneurysm noted. Electronically Signed   By: Lacy Duverney M.D.   On: 08/14/2016 18:55     PERTINENT LAB RESULTS: CBC:  Recent Labs  08/14/16 1244  WBC 5.8  HGB 13.2  HCT 40.6  PLT 149*   CMET CMP     Component Value Date/Time   NA 136 08/14/2016 1244   NA 134 (L) 12/28/2014 0458   K 4.3 08/14/2016 1244   K 4.2 12/28/2014 0458   CL 103 08/14/2016 1244   CL 103 12/28/2014 0458   CO2 23 08/14/2016 1244   CO2 26 12/28/2014 0458   GLUCOSE 311 (H) 08/14/2016 1244   GLUCOSE 359 (H) 12/28/2014 0458   BUN 16 08/14/2016 1244   BUN 15 12/28/2014 0458   CREATININE 0.88 08/14/2016 1244   CREATININE 0.77 12/28/2014 0458   CALCIUM 9.6 08/14/2016 1244   CALCIUM 8.4 (L) 12/28/2014 0458   PROT 6.8 08/14/2016 1244   PROT 7.6 12/27/2014 1033   ALBUMIN 3.9 08/14/2016 1244   ALBUMIN 4.0 12/27/2014 1033   AST 18 08/14/2016 1244   AST 19 12/27/2014 1033   ALT 14 08/14/2016 1244   ALT 11 (L) 12/27/2014 1033   ALKPHOS 52 08/14/2016 1244   ALKPHOS 49 12/27/2014 1033   BILITOT 0.3 08/14/2016 1244   BILITOT 1.2 12/27/2014 1033   GFRNONAA >60 08/14/2016 1244   GFRNONAA >60  12/28/2014 0458   GFRAA >60 08/14/2016 1244   GFRAA >60 12/28/2014 0458    GFR Estimated Creatinine Clearance: 88.5 mL/min (by C-G formula based on SCr of 0.88 mg/dL). No results for input(s): LIPASE, AMYLASE in the last 72 hours. No results for input(s): CKTOTAL, CKMB, CKMBINDEX, TROPONINI in the last 72 hours. Invalid input(s): POCBNP No results for input(s): DDIMER in the last 72 hours.  Recent Labs  08/15/16 0347  HGBA1C 13.3*    Recent Labs  08/15/16 0347  CHOL 189  HDL 43  LDLCALC 121*  TRIG 125  CHOLHDL 4.4   No results for input(s):  TSH, T4TOTAL, T3FREE, THYROIDAB in the last 72 hours.  Invalid input(s): FREET3 No results for input(s): VITAMINB12, FOLATE, FERRITIN, TIBC, IRON, RETICCTPCT in the last 72 hours. Coags: No results for input(s): INR in the last 72 hours.  Invalid input(s): PT Microbiology: Recent Results (from the past 240 hour(s))  Urine culture     Status: Abnormal   Collection Time: 08/13/16 10:15 AM  Result Value Ref Range Status   Specimen Description URINE, RANDOM  Final   Special Requests NONE  Final   Culture >=100,000 COLONIES/mL ESCHERICHIA COLI (A)  Final   Report Status 08/16/2016 FINAL  Final   Organism ID, Bacteria ESCHERICHIA COLI (A)  Final      Susceptibility   Escherichia coli - MIC*    AMPICILLIN >=32 RESISTANT Resistant     CEFAZOLIN <=4 SENSITIVE Sensitive     CEFTRIAXONE <=1 SENSITIVE Sensitive     CIPROFLOXACIN >=4 RESISTANT Resistant     GENTAMICIN <=1 SENSITIVE Sensitive     IMIPENEM <=0.25 SENSITIVE Sensitive     NITROFURANTOIN <=16 SENSITIVE Sensitive     TRIMETH/SULFA <=20 SENSITIVE Sensitive     AMPICILLIN/SULBACTAM >=32 RESISTANT Resistant     PIP/TAZO <=4 SENSITIVE Sensitive     Extended ESBL NEGATIVE Sensitive     * >=100,000 COLONIES/mL ESCHERICHIA COLI  Urine culture     Status: Abnormal   Collection Time: 08/15/16  4:25 AM  Result Value Ref Range Status   Specimen Description URINE, CLEAN CATCH  Final    Special Requests NONE  Final   Culture MULTIPLE SPECIES PRESENT, SUGGEST RECOLLECTION (A)  Final   Report Status 08/16/2016 FINAL  Final    FURTHER DISCHARGE INSTRUCTIONS:  Get Medicines reviewed and adjusted: Please take all your medications with you for your next visit with your Primary MD  Laboratory/radiological data: Please request your Primary MD to go over all hospital tests and procedure/radiological results at the follow up, please ask your Primary MD to get all Hospital records sent to his/her office.  In some cases, they will be blood work, cultures and biopsy results pending at the time of your discharge. Please request that your primary care M.D. goes through all the records of your hospital data and follows up on these results.  Also Note the following: If you experience worsening of your admission symptoms, develop shortness of breath, life threatening emergency, suicidal or homicidal thoughts you must seek medical attention immediately by calling 911 or calling your MD immediately  if symptoms less severe.  You must read complete instructions/literature along with all the possible adverse reactions/side effects for all the Medicines you take and that have been prescribed to you. Take any new Medicines after you have completely understood and accpet all the possible adverse reactions/side effects.   Do not drive when taking Pain medications or sleeping medications (Benzodaizepines)  Do not take more than prescribed Pain, Sleep and Anxiety Medications. It is not advisable to combine anxiety,sleep and pain medications without talking with your primary care practitioner  Special Instructions: If you have smoked or chewed Tobacco  in the last 2 yrs please stop smoking, stop any regular Alcohol  and or any Recreational drug use.  Wear Seat belts while driving.  Please note: You were cared for by a hospitalist during your hospital stay. Once you are discharged, your primary  care physician will handle any further medical issues. Please note that NO REFILLS for any discharge medications will be authorized once you are discharged, as it is  imperative that you return to your primary care physician (or establish a relationship with a primary care physician if you do not have one) for your post hospital discharge needs so that they can reassess your need for medications and monitor your lab values.  Total Time spent coordinating discharge including counseling, education and face to face time equals 45 minutes.  SignedJeoffrey Massed 08/16/2016 4:34 PM

## 2016-08-16 NOTE — Progress Notes (Signed)
Occupational Therapy Treatment Patient Details Name: [name] MRN: [id number] DOB: 03/26/1966 Today's Date: 08/16/2016    History of present illness 50 y.o. female admitted with L side weakness. 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  PMH of DM, HTN, CVA, legally blind, seizures.    OT comments  Husband available this session and able to offer more detailed information about pt's PLOF. States pt has been low energy since her stroke in 2014, but does not typically move or process information as slowly as she is currently and was not using a walker at home, although she has one. Pt will have 24 hour supervision at home and is currently mobilizing and completing standing ADL at this level. Agreeable to use of husband's shower seat and assist for tub transfers. Recommended husband also monitor pt's medication.   Follow Up Recommendations  Home health OT;Supervision/Assistance - 24 hour    Equipment Recommendations  3 in 1 bedside comode    Recommendations for Other Services      Precautions / Restrictions Precautions Precautions: Fall Restrictions Weight Bearing Restrictions: No       Mobility Bed Mobility Overal bed mobility: Modified Independent             General bed mobility comments: increased time, but no physical assist  Transfers Overall transfer level: Needs assistance   Transfers: Sit to/from Stand Sit to Stand: Supervision         General transfer comment: no physical assist from bed    Balance Overall balance assessment: Needs assistance Sitting-balance support: Feet supported Sitting balance-Leahy Scale: Good       Standing balance-Leahy Scale: Fair                     ADL Overall ADL's : Needs assistance/impaired     Grooming: Standing;Oral care;Supervision/safety               Lower Body Dressing: Minimal assistance;Sit to/from stand Lower Body Dressing Details (indicate cue type and reason):  donned and doffed socks crossing foot over opposite knee Toilet Transfer: Min guard;Ambulation;RW;BSC   Toileting- ArchitectClothing Manipulation and Hygiene: Min guard;Sit to/from stand       Functional mobility during ADLs: Min guard;Rolling walker General ADL Comments: pt does not typically use a shower seat as she reported, but husband has one she can use      Vision                 Additional Comments: pt with baseline impairment, cannot read small print   Perception     Praxis      Cognition   Behavior During Therapy: Flat affect Overall Cognitive Status: Impaired/Different from baseline Area of Impairment: Safety/judgement;Problem solving;Following commands        Following Commands: Follows one step commands with increased time Safety/Judgement: Decreased awareness of deficits   Problem Solving: Slow processing General Comments: per husband, pt typically manages her own meds, can use microwave and crochets and watches TV during the day    Extremity/Trunk Assessment               Exercises     Shoulder Instructions       General Comments      Pertinent Vitals/ Pain       Pain Assessment: No/denies pain Pain Score: 0-No pain  Home Living  Prior Functioning/Environment              Frequency  Min 2X/week        Progress Toward Goals  OT Goals(current goals can now be found in the care plan section)  Progress towards OT goals: Progressing toward goals  Acute Rehab OT Goals Patient Stated Goal: return home Potential to Achieve Goals: Good  Plan Discharge plan remains appropriate    Co-evaluation                 End of Session Equipment Utilized During Treatment: Gait belt;Rolling walker   Activity Tolerance Patient tolerated treatment well   Patient Left in bed;with call bell/phone within reach;with family/visitor present   Nurse Communication          Time:  0935-1001 OT Time Calculation (min): 26 min  Charges: OT General Charges $OT Visit: 1 Procedure OT Treatments $Self Care/Home Management : 23-37 mins  Evern BioMayberry, Brendaly Townsel Lynn 08/16/2016, 10:09 AM  [phone]

## 2016-08-16 NOTE — Progress Notes (Signed)
Speech Language Pathology Treatment: Cognitive-Linquistic  Patient Details Name: [name] MRN: [id number] DOB: 01/16/1966 Today's Date: 08/16/2016 Time: [id number] SLP Time Calculation (min) (ACUTE ONLY): 30 min  Assessment / Plan / Recommendation Clinical Impression  Skilled treatment session focused on cognition goals. SLP facilitated session by providing pt and her husband with handouts on memory strategies. Pt required Mod A verbal reminders to read handout using speech intelligibility strategies. Pt also required Mod A verbal encouragement to participate in task. When using speech intelligibility strategies, pt's intelligibility increasing to ~50% at the phrase level. [facility] Pt left in the care of her husband. Pt would benefit from further skilled ST services after discharge.    HPI HPI: 50 y/o patient presented to ER from home with IVC papers via [employer]. Patient states she is here "to get checked out because of having headaches". Patient's IVC papers state patient has h/o cutting self and SI attempt by overdose; that patient has been threatening husband and has been wandering off in the middle of the night outside. Patient has also been urinating on self and has seemed "out of it". Pt with history of hypertension, diabetes mellitus, hyperlipidemia, previous stroke (11/2015) and seizure disorder, presenting with new onset left-sided weakness. MRI revealed acute nonhemorrhagic left paracentral pontine infarct. 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 Global atrophy. Mild chronic microvascular changes.       SLP Plan  Continue with current plan of care     Recommendations                   General recommendations:  (Outpatient or HHST) Follow up Recommendations: Home health SLP;Outpatient SLP Plan: Continue with current plan of care       GO             Azharia Surratt B. Dreama Saaverton, M.S.,  CCC-SLP Speech-Language Pathologist [phone]   [name] 08/16/2016, 9:50 AM

## 2016-08-16 NOTE — Progress Notes (Signed)
PROGRESS NOTE        PATIENT DETAILS Name: [name] Age: 50 y.o. Sex: female Date of Birth: 12/19/1965 Admit Date: 08/14/2016 Admitting Physician Michael LitterNikki Carter, MD NWG:NFAOZHYPCP:[facility]  Brief Narrative: Patient is a 50 y.o. female with prior history of CVA, dyslipidemia, hypertension, seizure disorder who was admitted for evaluation of slurred speech and left facial droop. MRI of the brain demonstrates a left paracentral pontine infarct. See below for further details  Subjective: Wants to go home.   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 Has mild right-sided deficits on exam. Await carotid Doppler and echocardiogram. A1c 13.3, LDL 121. Recommendations are to continue aspirin. Await further workup.  Hypertension: Allow mild permissive hypertension, continue lisinopril and HCTZ. Follow  Dyslipidemia: Continue statin  Escherichia coli UTI: Continue Rocephin, will plan a very short course of probable 3 day course. She is afebrile  Uncontrolled Insulin-dependent diabetes with hyperglycemia: CBGs uncontrolled, increase Levemir to 45 units units, and NovoLog to 10 units with meals. A1c is 13.3-per spouse-she has been noncompliant to medications lately-he thinks she must have run out of her medications and just hasn't told her family or PCP.  Follow CBGs and adjust accordingly  History of seizures: Continue Depakote.  History of depression: Recently presented to the ED in [place] on 11/26 with bizarre behavior-and was evaluated by psychiatry-not felt to require inpatient psychiatric admission. During my evaluation, she was awake and alert and answering all my questions appropriately. Per spouse, she is back to her usual baseline. Denied any suicidal or homicidal ideation  Noncompliance to medications: Per spouse, she has been noncompliant to medications in the past-he thinks that some of her  medications may have run out.  Morbid obesity: Counseled   DVT Prophylaxis: Prophylactic Lovenox  Code Status: Full code  Family Communication: None at bedside  Disposition Plan: Remain inpatient-suspect would require home health on discharge  Antimicrobial agents: None  Procedures: None  CONSULTS:  neurology  Time spent: 25 minutes-Greater than 50% of this time was spent in counseling, explanation of diagnosis, planning of further management, and coordination of care.  MEDICATIONS: Anti-infectives    Start     Dose/Rate Route Frequency Ordered Stop   08/15/16 0330  cefTRIAXone (ROCEPHIN) 1 g in dextrose 5 % 50 mL IVPB     1 g 100 mL/hr over 30 Minutes Intravenous Daily at bedtime 08/15/16 0257        Scheduled Meds: . aspirin  325 mg Oral Daily  . atorvastatin  40 mg Oral q1800  . cefTRIAXone (ROCEPHIN)  IV  1 g Intravenous QHS  . divalproex  500 mg Oral Daily  . enoxaparin (LOVENOX) injection  40 mg Subcutaneous Q24H  . hydrochlorothiazide  12.5 mg Oral Daily  . insulin aspart  0-20 Units Subcutaneous TID WC  . insulin aspart  0-5 Units Subcutaneous QHS  . insulin aspart  6 Units Subcutaneous TID WC  . insulin detemir  35 Units Subcutaneous QHS  . lisinopril  20 mg Oral Daily   Continuous Infusions: PRN Meds:.acetaminophen **OR** acetaminophen   PHYSICAL EXAM: Vital signs: Vitals:   08/15/16 2100 08/16/16 0100 08/16/16 0630 08/16/16 0928  BP: (!) 143/63 130/65 110/82 119/77  Pulse: (!) 103 100 80 95  Resp: 20 20 20 20   Temp: 98.3 F (36.8 C) 98.1 F (  36.7 C) 97.9 F (36.6 C) 97.8 F (36.6 C)  TempSrc: Oral Oral Oral Oral  SpO2: 97% 98% 99% 100%  Weight:      Height:       Filed Weights   08/14/16 1210 08/14/16 2200  Weight: 106.6 kg (235 lb) 108.1 kg (238 lb 5.1 oz)   Body mass index is 43.59 kg/m.   General appearance :Awake, alert, not in any distress.  Eyes:, pupils equally reactive to light and accomodation,no scleral  icterus HEENT: Atraumatic and Normocephalic Neck: supple, no JVD. No cervical lymphadenopathy. No thyromegaly Resp:Good air entry bilaterally, no added sounds  CVS: S1 S2 regular, no murmurs.  GI: Bowel sounds present, Non tender and not distended with no gaurding, rigidity or rebound.No organomegaly Extremities: B/L Lower Ext shows no edema, both legs are warm to touch Neurology:  Right upper/right lower extremity around 4/5. Musculoskeletal:No digital cyanosis Skin:No Rash, warm and dry Wounds:N/A  I have personally reviewed following labs and imaging studies  LABORATORY DATA: CBC:  Recent Labs Lab 08/14/16 1244  WBC 5.8  NEUTROABS 3.6  HGB 13.2  HCT 40.6  MCV 61.1*  PLT 149*    Basic Metabolic Panel:  Recent Labs Lab 08/12/16 1351 08/14/16 1244  NA 137 136  K 4.0 4.3  CL 102 103  CO2 26 23  GLUCOSE 306* 311*  BUN 12 16  CREATININE 0.97 0.88  CALCIUM 8.9 9.6    GFR: Estimated Creatinine Clearance: 88.5 mL/min (by C-G formula based on SCr of 0.88 mg/dL).  Liver Function Tests:  Recent Labs Lab 08/12/16 1351 08/14/16 1244  AST 14* 18  ALT 15 14  ALKPHOS 53 52  BILITOT 0.7 0.3  PROT 7.5 6.8  ALBUMIN 3.8 3.9   No results for input(s): LIPASE, AMYLASE in the last 168 hours. No results for input(s): AMMONIA in the last 168 hours.  Coagulation Profile: No results for input(s): INR, PROTIME in the last 168 hours.  Cardiac Enzymes: No results for input(s): CKTOTAL, CKMB, CKMBINDEX, TROPONINI in the last 168 hours.  BNP (last 3 results) No results for input(s): PROBNP in the last 8760 hours.  HbA1C:  Recent Labs  08/15/16 0347  HGBA1C 13.3*    CBG:  Recent Labs Lab 08/15/16 0625 08/15/16 1102 08/15/16 2151 08/16/16 0618 08/16/16 1117  GLUCAP 207* 307* 341* 253* 316*    Lipid Profile:  Recent Labs  08/15/16 0347  CHOL 189  HDL 43  LDLCALC 121*  TRIG 125  CHOLHDL 4.4    Thyroid Function Tests: No results for input(s): TSH,  T4TOTAL, FREET4, T3FREE, THYROIDAB in the last 72 hours.  Anemia Panel: No results for input(s): VITAMINB12, FOLATE, FERRITIN, TIBC, IRON, RETICCTPCT in the last 72 hours.  Urine analysis:    Component Value Date/Time   COLORURINE AMBER (A) 08/15/2016 0425   APPEARANCEUR CLEAR 08/15/2016 0425   APPEARANCEUR Clear 12/27/2014 1806   LABSPEC 1.025 08/15/2016 0425   LABSPEC 1.035 12/27/2014 1806   PHURINE 5.5 08/15/2016 0425   GLUCOSEU NEGATIVE 08/15/2016 0425   GLUCOSEU >=500 12/27/2014 1806   HGBUR NEGATIVE 08/15/2016 0425   BILIRUBINUR SMALL (A) 08/15/2016 0425   BILIRUBINUR Negative 12/27/2014 1806   KETONESUR 40 (A) 08/15/2016 0425   PROTEINUR NEGATIVE 08/15/2016 0425   NITRITE NEGATIVE 08/15/2016 0425   LEUKOCYTESUR NEGATIVE 08/15/2016 0425   LEUKOCYTESUR Negative 12/27/2014 1806    Sepsis Labs: Lactic Acid, Venous No results found for: LATICACIDVEN  MICROBIOLOGY: Recent Results (from the past 240 hour(s))  Urine culture  Status: Abnormal   Collection Time: 08/13/16 10:15 AM  Result Value Ref Range Status   Specimen Description URINE, RANDOM  Final   Special Requests NONE  Final   Culture >=100,000 COLONIES/mL ESCHERICHIA COLI (A)  Final   Report Status 08/16/2016 FINAL  Final   Organism ID, Bacteria ESCHERICHIA COLI (A)  Final      Susceptibility   Escherichia coli - MIC*    AMPICILLIN >=32 RESISTANT Resistant     CEFAZOLIN <=4 SENSITIVE Sensitive     CEFTRIAXONE <=1 SENSITIVE Sensitive     CIPROFLOXACIN >=4 RESISTANT Resistant     GENTAMICIN <=1 SENSITIVE Sensitive     IMIPENEM <=0.25 SENSITIVE Sensitive     NITROFURANTOIN <=16 SENSITIVE Sensitive     TRIMETH/SULFA <=20 SENSITIVE Sensitive     AMPICILLIN/SULBACTAM >=32 RESISTANT Resistant     PIP/TAZO <=4 SENSITIVE Sensitive     Extended ESBL NEGATIVE Sensitive     * >=100,000 COLONIES/mL ESCHERICHIA COLI  Urine culture     Status: Abnormal   Collection Time: 08/15/16  4:25 AM  Result Value Ref Range  Status   Specimen Description URINE, CLEAN CATCH  Final   Special Requests NONE  Final   Culture MULTIPLE SPECIES PRESENT, SUGGEST RECOLLECTION (A)  Final   Report Status 08/16/2016 FINAL  Final    RADIOLOGY STUDIES/RESULTS: Ct Head Wo Contrast  Result Date: 08/14/2016 CLINICAL DATA:  Left-sided weakness and slurred speech. EXAM: CT HEAD WITHOUT CONTRAST TECHNIQUE: Contiguous axial images were obtained from the base of the skull through the vertex without intravenous contrast. COMPARISON:  None. FINDINGS: Brain: There is extensive encephalomalacia in the right parietal and anterior temporal lobes. There is ex vacuo dilatation of the right lateral ventricle. There are chronic ischemic changes in the periventricular white matter. There is also global atrophy. There is no mass effect or midline shift. No evidence of hemorrhage. Vascular: There is a calcified soft tissue mass in the region of the right carotid terminus. Aneurysm is not excluded. This may simply represent ectatic vasculature and atherosclerotic calcification. Skull: Cranium is intact. Sinuses/Orbits: Minimal mucus material layers in the right maxillary sinus. Remainder the visualized paranasal sinuses are clear. Mastoid air cells are clear. Other: None. IMPRESSION: Extensive chronic ischemic changes in the right MCA territory. Ectatic vasculature versus aneurysm in the right internal carotid artery. CT angiogram may be helpful. Electronically Signed   By: Jolaine ClickArthur  Hoss M.D.   On: 08/14/2016 13:14   Mr Maxine GlennMra Head Wo Contrast  Result Date: 08/14/2016 CLINICAL DATA:  50 year old hypertensive diabetic female with hyperlipidemia and history of infarct and seizures presenting with left-sided weakness. Subsequent encounter. EXAM: MRI HEAD WITHOUT CONTRAST MRA HEAD WITHOUT CONTRAST TECHNIQUE: Multiplanar, multiecho pulse sequences of the brain and surrounding structures were obtained without intravenous contrast. Angiographic images of the head were  obtained using MRA technique without contrast. COMPARISON:  08/14/2016 head CT.  No comparison brain MR. FINDINGS: MRI HEAD FINDINGS Exam is motion degraded. Brain: Acute nonhemorrhagic left paracentral pontine infarct. Acute/ subacute infarct anterior margin of the posterior limb of the left internal capsule. Remote large right middle cerebral artery distribution infarct with encephalomalacia right temporal lobe, right parietal lobe and posterior right frontal lobe with subsequent dilation of the right lateral ventricle. Wallerian degeneration of the right cerebral peduncle. Mild chronic microvascular changes. Global atrophy without hydrocephalus. Punctate blood breakdown products left lenticular nucleus left thalamus consistent prior episodes hemorrhagic ischemia. Pituitary gland top-normal size measuring up to 7.8 mm with slightly convex superior margin  without suprasellar extension. Vascular: As below. Skull and upper cervical spine: No acute abnormality. Sinuses/Orbits: Mucosal thickening inferior maxillary sinuses measuring up to 7 mm on the left and 3 mm on the right. No acute orbital abnormality. Other: Negative MRA HEAD FINDINGS Moderate narrowing and irregularity cavernous segment right internal carotid artery. Moderate tandem stenosis M1 segment right middle cerebral artery. Decrease number of visualized right middle cerebral artery branches consistent with patient's right middle cerebral artery distribution infarct. Vessels which are visualized appear narrowed and irregular. Moderate to marked narrowing proximal A1 segment right anterior cerebral artery. Moderate tandem stenosis A1 segment left anterior cerebral artery. Moderate narrowing mid aspect M1 segment left middle cerebral artery. Left middle cerebral artery moderate branch vessel narrowing and irregularity. Right vertebral artery ends in a narrowed irregular right posterior inferior cerebellar artery distribution. Ectatic left vertebral artery  mild narrowing distally. Nonvisualized left posterior inferior cerebellar artery. Mild irregularity in ectasia basilar artery without high-grade stenosis. Nonvisualized right anterior inferior cerebellar artery. Posterior cerebral artery distal branch vessel mild moderate narrowing greater on the right. No aneurysm noted. IMPRESSION: MRI HEAD Exam is motion degraded. Acute nonhemorrhagic left paracentral pontine infarct. Acute/ subacute infarct anterior margin of the posterior limb of the left internal capsule. 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 Mild chronic microvascular changes. Global atrophy. Punctate blood breakdown products left lenticular nucleus left thalamus consistent prior episodes of hemorrhagic ischemia. Pituitary gland top-normal size. Mucosal thickening inferior maxillary sinuses measuring up to 7 mm on the left and 3 mm on the right. MRA HEAD Moderate narrowing and irregularity cavernous segment right internal carotid artery. Moderate tandem stenosis M1 segment right middle cerebral artery. Decrease number of visualized right middle cerebral artery branches consistent with patient's right middle cerebral artery distribution infarct. Vessels which are visualized appear narrowed and irregular. Moderate to marked narrowing proximal A1 segment right anterior cerebral artery. Moderate tandem stenosis A1 segment left anterior cerebral artery. Moderate narrowing mid aspect M1 segment left middle cerebral artery. Left middle cerebral artery moderate branch vessel narrowing and irregularity. Right vertebral artery ends in a narrowed irregular right posterior inferior cerebellar artery distribution. Ectatic left vertebral artery mild narrowing distally. Nonvisualized left posterior inferior cerebellar artery. Mild irregularity and ectasia basilar artery without high-grade stenosis. Nonvisualized right anterior inferior cerebellar artery. Posterior cerebral  artery distal branch vessel mild moderate narrowing greater on the right. No aneurysm noted. Electronically Signed   By: Lacy Duverney M.D.   On: 08/14/2016 18:55   Mr Brain Wo Contrast  Result Date: 08/14/2016 CLINICAL DATA:  50 year old hypertensive diabetic female with hyperlipidemia and history of infarct and seizures presenting with left-sided weakness. Subsequent encounter. EXAM: MRI HEAD WITHOUT CONTRAST MRA HEAD WITHOUT CONTRAST TECHNIQUE: Multiplanar, multiecho pulse sequences of the brain and surrounding structures were obtained without intravenous contrast. Angiographic images of the head were obtained using MRA technique without contrast. COMPARISON:  08/14/2016 head CT.  No comparison brain MR. FINDINGS: MRI HEAD FINDINGS Exam is motion degraded. Brain: Acute nonhemorrhagic left paracentral pontine infarct. Acute/ subacute infarct anterior margin of the posterior limb of the left internal capsule. Remote large right middle cerebral artery distribution infarct with encephalomalacia right temporal lobe, right parietal lobe and posterior right frontal lobe with subsequent dilation of the right lateral ventricle. Wallerian degeneration of the right cerebral peduncle. Mild chronic microvascular changes. Global atrophy without hydrocephalus. Punctate blood breakdown products left lenticular nucleus left thalamus consistent prior episodes hemorrhagic ischemia. Pituitary gland top-normal size measuring up to  7.8 mm with slightly convex superior margin without suprasellar extension. Vascular: As below. Skull and upper cervical spine: No acute abnormality. Sinuses/Orbits: Mucosal thickening inferior maxillary sinuses measuring up to 7 mm on the left and 3 mm on the right. No acute orbital abnormality. Other: Negative MRA HEAD FINDINGS Moderate narrowing and irregularity cavernous segment right internal carotid artery. Moderate tandem stenosis M1 segment right middle cerebral artery. Decrease number of  visualized right middle cerebral artery branches consistent with patient's right middle cerebral artery distribution infarct. Vessels which are visualized appear narrowed and irregular. Moderate to marked narrowing proximal A1 segment right anterior cerebral artery. Moderate tandem stenosis A1 segment left anterior cerebral artery. Moderate narrowing mid aspect M1 segment left middle cerebral artery. Left middle cerebral artery moderate branch vessel narrowing and irregularity. Right vertebral artery ends in a narrowed irregular right posterior inferior cerebellar artery distribution. Ectatic left vertebral artery mild narrowing distally. Nonvisualized left posterior inferior cerebellar artery. Mild irregularity in ectasia basilar artery without high-grade stenosis. Nonvisualized right anterior inferior cerebellar artery. Posterior cerebral artery distal branch vessel mild moderate narrowing greater on the right. No aneurysm noted. IMPRESSION: MRI HEAD Exam is motion degraded. Acute nonhemorrhagic left paracentral pontine infarct. Acute/ subacute infarct anterior margin of the posterior limb of the left internal capsule. 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 Mild chronic microvascular changes. Global atrophy. Punctate blood breakdown products left lenticular nucleus left thalamus consistent prior episodes of hemorrhagic ischemia. Pituitary gland top-normal size. Mucosal thickening inferior maxillary sinuses measuring up to 7 mm on the left and 3 mm on the right. MRA HEAD Moderate narrowing and irregularity cavernous segment right internal carotid artery. Moderate tandem stenosis M1 segment right middle cerebral artery. Decrease number of visualized right middle cerebral artery branches consistent with patient's right middle cerebral artery distribution infarct. Vessels which are visualized appear narrowed and irregular. Moderate to marked narrowing proximal A1 segment  right anterior cerebral artery. Moderate tandem stenosis A1 segment left anterior cerebral artery. Moderate narrowing mid aspect M1 segment left middle cerebral artery. Left middle cerebral artery moderate branch vessel narrowing and irregularity. Right vertebral artery ends in a narrowed irregular right posterior inferior cerebellar artery distribution. Ectatic left vertebral artery mild narrowing distally. Nonvisualized left posterior inferior cerebellar artery. Mild irregularity and ectasia basilar artery without high-grade stenosis. Nonvisualized right anterior inferior cerebellar artery. Posterior cerebral artery distal branch vessel mild moderate narrowing greater on the right. No aneurysm noted. Electronically Signed   By: Lacy Duverney M.D.   On: 08/14/2016 18:55     LOS: 2 days   Jeoffrey Massed, MD  [facility] Pager:[phone]  If 7PM-7AM, please contact night-coverage [email or web address] Password [email or web address] 08/16/2016, 12:28 PM

## 2016-08-16 NOTE — Progress Notes (Signed)
Patient discharged home with husband. Discharge instructions were reviewed with patient ,and husband. Patient and husband verbalized understanding.

## 2018-03-05 ENCOUNTER — Encounter: Payer: Self-pay | Admitting: Physical Therapy

## 2018-03-05 ENCOUNTER — Other Ambulatory Visit: Payer: Self-pay

## 2018-03-05 ENCOUNTER — Ambulatory Visit: Payer: BLUE CROSS/BLUE SHIELD | Attending: Neurology | Admitting: Physical Therapy

## 2018-03-05 DIAGNOSIS — R2681 Unsteadiness on feet: Secondary | ICD-10-CM | POA: Insufficient documentation

## 2018-03-05 DIAGNOSIS — M6281 Muscle weakness (generalized): Secondary | ICD-10-CM | POA: Insufficient documentation

## 2018-03-05 DIAGNOSIS — R2689 Other abnormalities of gait and mobility: Secondary | ICD-10-CM | POA: Diagnosis not present

## 2018-03-05 NOTE — Therapy (Addendum)
[place] [facility] [address] [place], [place], [address] Phone: [phone]   Fax:  [phone]  Physical Therapy Evaluation  Patient Details  Name: [name] Shipperndrea Wecker MRN: [id number] Date of Birth: 04/25/1966 Referring Provider: Dr. Malvin JohnsPotter   Encounter Date: 03/05/2018  PT End of Session - 03/05/18 0956    Visit Number  1    Number of Visits  17    Date for PT Re-Evaluation  04/30/18    Authorization Type  progress note 1/10    PT Start Time  0845    PT Stop Time  0946    PT Time Calculation (min)  61 min    Equipment Utilized During Treatment  Gait belt    Activity Tolerance  Patient tolerated treatment well;Patient limited by fatigue    Behavior During Therapy  [facility] for tasks assessed/performed       Past Medical History:  Diagnosis Date  . Allergic rhinitis   . Chickenpox   . Depression   . Diabetes (HCC)   . Epilepsy (HCC)   . Headache   . HTN (hypertension)   . Hyperlipidemia   . Seizures (HCC)   . Stroke (HCC)   . Urinary incontinence     Past Surgical History:  Procedure Laterality Date  . CESAREAN SECTION    . INCISION AND DRAINAGE Right 05/13/2016   Procedure: INCISION AND DRAINAGE;  Surgeon: Gwyneth RevelsJustin Fowler, DPM;  Location: [facility] ORS;  Service: Podiatry;  Laterality: Right;    There were no vitals filed for this visit.   Subjective Assessment - 03/05/18 0851    Subjective  Pt reports "had a couple strokes and made left side weak." Pt states first stroke was in 2017 and had second stroke last year. Pt reports she feels unsteady when walking; reports fear of falling but denies any falls in the last few months. Pt reports 3 near fall experiences but no injuries. Pt reports having a "little bit" of pain in left leg only.     Pertinent History  Patient is a 52 y.o. female with history of strokes in 2017 and 2018 reporting left sided weakness and difficulty with ambulation. Pt is legally blind and uses a cane for  assistance with ambulation. PMH is significant for diabetes type 2, hyperlipidemia, hypertension, and obesity. Pt demonstrates LE > UE weakness on the left with general deconditioning and unsteadiness when standing.     Limitations  Standing;Walking;House hold activities    How long can you sit comfortably?  NA    How long can you stand comfortably?  Roughly 5 min    How long can you walk comfortably?  Few minutes    Diagnostic tests  MRI in 2017- left pontine infarct, infarct anterior margin of posterior limb of left internal capsule, right MCA infarct    Patient Stated Goals  Not having to use cane anymore    Currently in Pain?  Yes    Pain Score  6     Pain Location  Leg    Pain Orientation  Left    Pain Descriptors / Indicators  Tingling;Aching    Pain Type  Chronic pain    Pain Onset  More than a month ago    Pain Frequency  Intermittent    Aggravating Factors   standing, when leg gets tight, walking    Pain Relieving Factors  sitting, ibuprofen     Effect of Pain on Daily Activities  ADLs including cooking and cleaning  [facility] PT Assessment - 03/05/18 0001      Assessment   Medical Diagnosis  CVA/left sided weakness    Referring Provider  Dr. Malvin Johns    Onset Date/Surgical Date  -- First stroke in 2017    Hand Dominance  Right    Next MD Visit  Not currently scheduled    Prior Therapy  None      Precautions   Precautions  Fall      Restrictions   Weight Bearing Restrictions  No      Balance Screen   Has the patient fallen in the past 6 months  No    Has the patient had a decrease in activity level because of a fear of falling?   Yes    Is the patient reluctant to leave their home because of a fear of falling?   No      Home Environment   Living Environment  Private residence    Living Arrangements  Spouse/significant other;Children    Available Help at Discharge  Family    Type of Home  House    Home Access  Stairs to enter    Entrance Stairs-Number of  Steps  6 stairs in the front, 2 stairs in back     Entrance Stairs-Rails  Left;Right;Can reach both    Home Layout  Two level    Home Equipment  [place] - single point;Walker - 2 wheels      Prior Function   Level of Independence  Independent;Independent with basic ADLs;Independent with household mobility without device;Independent with gait;Independent with transfers    Vocation  On disability    Leisure  Watch TV, read, Production manager   Overall Cognitive Status  Within Functional Limits for tasks assessed      Observation/Other Assessments   Observations  Forward head, rounded shoulders      Sensation   Light Touch  Appears Intact      Coordination   Gross Motor Movements are Fluid and Coordinated  Yes    Fine Motor Movements are Fluid and Coordinated  No    Finger Nose Finger Test  Dysmetria on left > right with decreased speed       ROM / Strength   AROM / PROM / Strength  Strength      Strength   Right Hip Flexion  4/5    Left Hip Flexion  3+/5    Right Knee Flexion  4/5    Right Knee Extension  4/5    Left Knee Flexion  4-/5    Left Knee Extension  3+/5    Right Ankle Dorsiflexion  4/5    Left Ankle Dorsiflexion  4/5      Transfers   Transfers  Sit to Stand    Sit to Stand  6: Modified independent (Device/Increase time)      Ambulation/Gait   Gait Comments  Decreased gait speed; decreased step length bilaterally, wide BOS; shuffling steps with increased fatigue; decreased arm swing      Balance   Balance Assessed  Yes      Standardized Balance Assessment   Standardized Balance Assessment  Berg Balance Test;Five Times Sit to Stand;10 meter walk test    Five times sit to stand comments   64 sec with BUE support and use of SPC when standing; indicates high fall risk    10 Meter Walk  0.27 m/s with SPC indicates high fall risk and limited household ambulator(36.8  sec)      Berg Balance Test   Sit to Stand  Able to stand using hands after several tries     Standing Unsupported  Able to stand 30 seconds unsupported    Sitting with Back Unsupported but Feet Supported on Floor or Stool  Able to sit safely and securely 2 minutes    Stand to Sit  Controls descent by using hands    Transfers  Able to transfer safely, definite need of hands    Standing Unsupported with Eyes Closed  Able to stand 10 seconds with supervision    Standing Ubsupported with Feet Together  Able to place feet together independently but unable to hold for 30 seconds    From Standing, Reach Forward with Outstretched Arm  Can reach forward >5 cm safely (2")    From Standing Position, Pick up Object from Floor  Able to pick up shoe, needs supervision    From Standing Position, Turn to Look Behind Over each Shoulder  Looks behind one side only/other side shows less weight shift    Turn 360 Degrees  Able to turn 360 degrees safely but slowly    Standing Unsupported, Alternately Place Feet on Step/Stool  Able to complete >2 steps/needs minimal assist    Standing Unsupported, One Foot in [employer] balance while stepping or standing    Standing on One Leg  Tries to lift leg/unable to hold 3 seconds but remains standing independently    Total Score  31    Berg comment:  High fall risk (close to 100%)                Objective measurements completed on examination: See above findings.   Treatment Educated and instructed patient in HEP: Seated marching, alternating LE to work on LE hip flexion strength and work on activity tolerance Seated LAQs, alternating LE to work on LE knee extension strength and activity tolerance        PT Education - 03/05/18 0955    Education Details  plan of care, HEP, recommendations    Person(s) Educated  Patient    Methods  Explanation;Handout;Verbal cues;Demonstration    Comprehension  Verbalized understanding;Returned demonstration;Need further instruction       PT Short Term Goals - 03/05/18 1007      PT SHORT TERM GOAL #1    Title  Patient will increase BLE gross strength to 4+/5 as to improve functional strength for independent gait, increased standing tolerance and increased ADL ability.    Baseline  grossly 3+ to 4- throughout LE    Time  4    Period  Weeks    Status  New    Target Date  04/02/18      PT SHORT TERM GOAL #2   Title  Pt will increase standing tolerance time to at least 5 min to improve ability to perform ADLs such as preparing a meal.     Baseline  only able to stand for approximately 1 min;     Time  4    Period  Weeks    Status  New    Target Date  04/02/18        PT Long Term Goals - 03/05/18 1009      PT LONG TERM GOAL #1   Title  Patient will be independent in home exercise program to improve strength/mobility for better functional independence with ADLs.    Baseline  does not have a HEP  Time  8    Period  Weeks    Status  New    Target Date  04/30/18      PT LONG TERM GOAL #2   Title  Pt will improve Berg Balance Assessment score by 7 points to decrease fall risk in home and community environment.     Baseline  31/56    Time  8    Period  Weeks    Status  New    Target Date  04/30/18      PT LONG TERM GOAL #3   Title  Pt will increase gait speed by 0.5 m/s to improve ambulation in the home and community environment and to reduce fall risk.     Baseline  0.27 m/s with SPC    Time  8    Period  Weeks    Status  New    Target Date  04/30/18      PT LONG TERM GOAL #4   Title  Patient will decrease 5 times sit-to-stand time to < 1 min without UE support in order to demonstrate increased LE strength and endurance.    Baseline  current: 64 sec with UE pushing on chair;     Time  8    Period  Weeks    Status  New    Target Date  04/30/18             Plan - 03/05/18 0957    Clinical Impression Statement  Pt is a 52 y.o. female with LLE weakness secondary to previous CVA. Pt demonstrated overall deconditioning and decreased activity tolerance for activities  during therapy session. Pt performed 10 meter walk test slowly with decreased step length bilaterally and required VCs for continuing ambulation and CGA for safety. Pt required frequent rest breaks throughout session and could not tolerate standing/walking activities > 2 minutes at a time. Pt unable to perform high level balance tasks including SLS. Pt appeared fearful of falling with activities with limited UE support. Pt completed 10 meter walk, 5 times sit-to-stand, and Berg Balance Assessment which all indicated increased fall risk and limited household ambulator only. Pt will benefit from skilled therapy to address LE strength deficits, balance impairments, decreased activity tolerance, general deconditioning, and gait safety.    History and Personal Factors relevant to plan of care:  (+) family support, motivation, no fall history (-) obesity, co-morbidities, stairs to enter the home, 2-story home, legally blind    Clinical Presentation  Evolving    Clinical Presentation due to:  Co-morbidities well controlled with medication, no fall history, legally blind, high fall risk, lack of standing tolerance    Clinical Decision Making  Moderate    Rehab Potential  Fair    Clinical Impairments Affecting Rehab Potential  (+) family support, motivation, no recent falls (-) obesity, co-morbidities    PT Frequency  2x / week    PT Duration  8 weeks    PT Treatment/Interventions  Cryotherapy;Moist Heat;DME Instruction;Gait training;Stair training;Functional mobility training;Therapeutic activities;Therapeutic exercise;Neuromuscular re-education;Balance training;Patient/family education    PT Next Visit Plan  assess 6 min walk test, continue LE strengthening and balance training     PT Home Exercise Plan  seated marching and LAQ    Consulted and Agree with Plan of Care  Patient       Patient will benefit from skilled therapeutic intervention in order to improve the following deficits and impairments:   Abnormal gait, Decreased activity tolerance, Decreased balance, Decreased coordination,  Decreased endurance, Decreased mobility, Decreased strength, Difficulty walking, Pain  Visit Diagnosis: Other abnormalities of gait and mobility  Muscle weakness (generalized)  Unsteadiness on feet     Problem List Patient Active Problem List   Diagnosis Date Noted  . Cerebral infarction (HCC) 08/14/2016  . Acute CVA (cerebrovascular accident) (HCC) 08/14/2016  . Delirium due to another medical condition 08/13/2016  . Urinary tract infection 08/13/2016  . Right foot infection 05/11/2016  . Foot abscess, right 05/11/2016  . Anxiety and depression 12/02/2015  . CVA (cerebral infarction) 11/27/2015  . Epilepsy (HCC) 06/17/2015  . 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 Headache 05/19/2015  . Weakness of left side of body 05/19/2015  . History of stroke 05/19/2015  . Severe obesity (BMI >= 40) (HCC) 03/19/2014  . HTN (hypertension)   . Diabetes (HCC)   . Hyperlipidemia   . [name] (transient ischemic attack) 03/17/2014   Dossie Arbour, SPT This entire session was performed under direct supervision and direction of a licensed therapist/therapist assistant . I have personally read, edited and approve of the note as written.  Trotter,Margaret DPT 03/05/2018, 2:57 PM   [place] [facility] MAIN [facility] SERVICES [address] [place], [place], [address] Phone: [phone]   Fax:  [phone]  Name: [name] MRN: [id number] Date of Birth: November 25, 1965

## 2018-03-05 NOTE — Patient Instructions (Signed)
HEP given with seated marching and long arc quads (10 reps bilaterally x 3 sets x 1x daily x 7 days/week)

## 2018-03-11 ENCOUNTER — Ambulatory Visit: Payer: BLUE CROSS/BLUE SHIELD | Admitting: Physical Therapy

## 2018-03-13 ENCOUNTER — Encounter: Payer: Self-pay | Admitting: Physical Therapy

## 2018-03-13 ENCOUNTER — Ambulatory Visit: Payer: BLUE CROSS/BLUE SHIELD | Admitting: Physical Therapy

## 2018-03-13 DIAGNOSIS — M6281 Muscle weakness (generalized): Secondary | ICD-10-CM

## 2018-03-13 DIAGNOSIS — R2689 Other abnormalities of gait and mobility: Secondary | ICD-10-CM

## 2018-03-13 DIAGNOSIS — R2681 Unsteadiness on feet: Secondary | ICD-10-CM

## 2018-03-13 NOTE — Patient Instructions (Signed)
Access Code: [email or web address]  URL: [email or web address]  Date: 03/13/2018  Prepared by: Encarnacion ChuAshley Aniella Wandrey  Exercises  Supine Active Straight Leg Raise - 10 reps - 2 sets - 2x daily - 7x weekly  Supine Hip Abduction - 10 reps - 2 sets - 2x daily - 7x weekly

## 2018-03-13 NOTE — Therapy (Signed)
[place] [facility] [address] [place], [place], [address] Phone: [phone]   Fax:  [phone]  Physical Therapy Treatment  Patient Details  Name: [name] MRN: [id number] Date of Birth: 01/07/1966 Referring Provider: Dr. Malvin JohnsPotter   Encounter Date: 03/13/2018  PT End of Session - 03/13/18 0905    Visit Number  2    Number of Visits  17    Date for PT Re-Evaluation  04/30/18    Authorization Type  progress note 2/10    PT Start Time  0904    PT Stop Time  0944    PT Time Calculation (min)  40 min    Equipment Utilized During Treatment  Gait belt    Activity Tolerance  Patient tolerated treatment well;Patient limited by fatigue    Behavior During Therapy  [facility] for tasks assessed/performed       Past Medical History:  Diagnosis Date  . Allergic rhinitis   . Chickenpox   . Depression   . Diabetes (HCC)   . Epilepsy (HCC)   . Headache   . HTN (hypertension)   . Hyperlipidemia   . Seizures (HCC)   . Stroke (HCC)   . Urinary incontinence     Past Surgical History:  Procedure Laterality Date  . CESAREAN SECTION    . INCISION AND DRAINAGE Right 05/13/2016   Procedure: INCISION AND DRAINAGE;  Surgeon: Gwyneth RevelsJustin Fowler, DPM;  Location: [facility] ORS;  Service: Podiatry;  Laterality: Right;    There were no vitals filed for this visit.  Subjective Assessment - 03/13/18 0910    Subjective  Pt reports she has been completing her HEP without questions or concerns.  Pt reports pain in L medial knee which she noticed when she woke up this morning, this pain is new, pt denies doing any activity last night that would have caused this pain.      Pertinent History  Patient is a 52 y.o. female with history of strokes in 2017 and 2018 reporting left sided weakness and difficulty with ambulation. Pt is legally blind and uses a cane for assistance with ambulation. PMH is significant for diabetes type 2, hyperlipidemia, hypertension, and  obesity. Pt demonstrates LE > UE weakness on the left with general deconditioning and unsteadiness when standing.     Limitations  Standing;Walking;House hold activities    How long can you sit comfortably?  NA    How long can you stand comfortably?  Roughly 5 min    How long can you walk comfortably?  Few minutes    Diagnostic tests  MRI in 2017- left pontine infarct, infarct anterior margin of posterior limb of left internal capsule, right MCA infarct    Patient Stated Goals  Not having to use cane anymore    Currently in Pain?  Yes    Pain Score  4     Pain Location  Knee    Pain Orientation  Left;Medial    Pain Descriptors / Indicators  Aching    Pain Type  Acute pain    Pain Onset  Today        TREATMENT  Therapeutic Activity:  Pt requires min assist for sit<>stand and stand pivot transfer from transfer chair to mat table and again for pivot from mat table to transfer chair.  Pt requires mod assist to bring BLEs into bed for supine>sit.  Therapeutic Exercise:  Vitals taken at start of session in sitting: BP 129/86, Pulse 94, SpO2 99%  Seated marching 2x15 each LE. Cues for increased hip F as pt with minimal foot clearance initially  LAQ 2x10 each LE  Bil hip Adduction pillow squeeze isometric with 5 second holds x10 in sitting  SLR x10 each LE with extensor lag noted BLE. Pt requires several rest breaks to achieve 10 reps each LE. (added to HEP)  Supine hip Abd x10 each LE (added to HEP)  SAQ 2x10 each LE with cues for full E                         PT Education - 03/13/18 0905    Education Details  Exercise technique    Person(s) Educated  Patient    Methods  Explanation;Demonstration;Verbal cues    Comprehension  Verbalized understanding;Returned demonstration;Need further instruction       PT Short Term Goals - 03/05/18 1007      PT SHORT TERM GOAL #1   Title  Patient will increase BLE gross strength to 4+/5 as to improve functional strength for  independent gait, increased standing tolerance and increased ADL ability.    Baseline  grossly 3+ to 4- throughout LE    Time  4    Period  Weeks    Status  New    Target Date  04/02/18      PT SHORT TERM GOAL #2   Title  Pt will increase standing tolerance time to at least 5 min to improve ability to perform ADLs such as preparing a meal.     Baseline  only able to stand for approximately 1 min;     Time  4    Period  Weeks    Status  New    Target Date  04/02/18        PT Long Term Goals - 03/05/18 1009      PT LONG TERM GOAL #1   Title  Patient will be independent in home exercise program to improve strength/mobility for better functional independence with ADLs.    Baseline  does not have a HEP    Time  8    Period  Weeks    Status  New    Target Date  04/30/18      PT LONG TERM GOAL #2   Title  Pt will improve Berg Balance Assessment score by 7 points to decrease fall risk in home and community environment.     Baseline  31/56    Time  8    Period  Weeks    Status  New    Target Date  04/30/18      PT LONG TERM GOAL #3   Title  Pt will increase gait speed by 0.5 m/s to improve ambulation in the home and community environment and to reduce fall risk.     Baseline  0.27 m/s with SPC    Time  8    Period  Weeks    Status  New    Target Date  04/30/18      PT LONG TERM GOAL #4   Title  Patient will decrease 5 times sit-to-stand time to < 1 min without UE support in order to demonstrate increased LE strength and endurance.    Baseline  current: 64 sec with UE pushing on chair;     Time  8    Period  Weeks    Status  New    Target Date  04/30/18  Plan - 03/13/18 0911    Clinical Impression Statement  Pt requires min assist for sit<>stand and stand pivot transfers.  She demonstrates Bil hip F weakness with marching activity.  Pt demonstrates extensor lag with SLR BLE and fatigues quickly with SLR exercise.  Pt reported acute onset of L medial knee  pain this morning with no known MOI.  Site assessed with no TTP.  Will continue to assess this at future sessions should it continue.  Pt will benefit from continued skilled PT interventions for decreased pain and improved QOL.      Rehab Potential  Fair    Clinical Impairments Affecting Rehab Potential  (+) family support, motivation, no recent falls (-) obesity, co-morbidities    PT Frequency  2x / week    PT Duration  8 weeks    PT Treatment/Interventions  Cryotherapy;Moist Heat;DME Instruction;Gait training;Stair training;Functional mobility training;Therapeutic activities;Therapeutic exercise;Neuromuscular re-education;Balance training;Patient/family education    PT Next Visit Plan  assess 6 min walk test, continue LE strengthening and balance training     PT Home Exercise Plan  seated marching and LAQ, SLR, supine hip Abd    Consulted and Agree with Plan of Care  Patient       Patient will benefit from skilled therapeutic intervention in order to improve the following deficits and impairments:  Abnormal gait, Decreased activity tolerance, Decreased balance, Decreased coordination, Decreased endurance, Decreased mobility, Decreased strength, Difficulty walking, Pain  Visit Diagnosis: Other abnormalities of gait and mobility  Muscle weakness (generalized)  Unsteadiness on feet     Problem List Patient Active Problem List   Diagnosis Date Noted  . Cerebral infarction (HCC) 08/14/2016  . Acute CVA (cerebrovascular accident) (HCC) 08/14/2016  . Delirium due to another medical condition 08/13/2016  . Urinary tract infection 08/13/2016  . Right foot infection 05/11/2016  . Foot abscess, right 05/11/2016  . Anxiety and depression 12/02/2015  . CVA (cerebral infarction) 11/27/2015  . Epilepsy (HCC) 06/17/2015  . 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 Headache 05/19/2015  . Weakness of left side of body 05/19/2015  . History of stroke 05/19/2015  . Severe obesity (BMI >= 40) (HCC)  03/19/2014  . HTN (hypertension)   . Diabetes (HCC)   . Hyperlipidemia   . [name] (transient ischemic attack) 03/17/2014    Encarnacion Chu PT, DPT 03/13/2018, 9:43 AM  [place] [facility] MAIN [facility] SERVICES [address] [place], [place], [address] Phone: [phone]   Fax:  [phone]  Name: [name] MRN: [id number] Date of Birth: 29-Aug-1966

## 2018-03-18 ENCOUNTER — Encounter: Payer: Self-pay | Admitting: Physical Therapy

## 2018-03-18 ENCOUNTER — Ambulatory Visit: Payer: BLUE CROSS/BLUE SHIELD | Attending: Neurology | Admitting: Physical Therapy

## 2018-03-18 VITALS — BP 125/88 | HR 81

## 2018-03-18 DIAGNOSIS — R2681 Unsteadiness on feet: Secondary | ICD-10-CM

## 2018-03-18 DIAGNOSIS — R2689 Other abnormalities of gait and mobility: Secondary | ICD-10-CM

## 2018-03-18 DIAGNOSIS — M6281 Muscle weakness (generalized): Secondary | ICD-10-CM

## 2018-03-18 NOTE — Patient Instructions (Signed)
Access Code: [email or web address]  URL: [email or web address]  Date: 03/18/2018  Prepared by: Encarnacion ChuAshley Abashian   Exercises  Seated March - 15 reps - 2 sets - 2x daily - 7x weekly  Seated Long Arc Quad - 10 reps - 3 sets - 2x daily - 7x weekly  Supine Active Straight Leg Raise - 10 reps - 2 sets - 2x daily - 7x weekly  Supine Hip Abduction - 10 reps - 3 sets - 2x daily - 7x weekly

## 2018-03-18 NOTE — Therapy (Signed)
[facility] [address] [place], [place], [address] Phone: [phone]   Fax:  [phone]  Physical Therapy Treatment  Patient Details  Name: [name] MRN: [id number] Date of Birth: 05/09/1966 Referring Provider: Dr. Malvin JohnsPotter   Encounter Date: 03/18/2018  PT End of Session - 03/18/18 0923    Visit Number  3    Number of Visits  17    Date for PT Re-Evaluation  04/30/18    Authorization Type  progress note 3/10    PT Start Time  0924    PT Stop Time  1018    PT Time Calculation (min)  54 min    Equipment Utilized During Treatment  Gait belt    Activity Tolerance  Patient tolerated treatment well;Patient limited by fatigue    Behavior During Therapy  [facility] for tasks assessed/performed       Past Medical History:  Diagnosis Date  . Allergic rhinitis   . Chickenpox   . Depression   . Diabetes (HCC)   . Epilepsy (HCC)   . Headache   . HTN (hypertension)   . Hyperlipidemia   . Seizures (HCC)   . Stroke (HCC)   . Urinary incontinence     Past Surgical History:  Procedure Laterality Date  . CESAREAN SECTION    . INCISION AND DRAINAGE Right 05/13/2016   Procedure: INCISION AND DRAINAGE;  Surgeon: Gwyneth RevelsJustin Fowler, DPM;  Location: [facility] ORS;  Service: Podiatry;  Laterality: Right;    Vitals:   03/18/18 0934  BP: 125/88  Pulse: 81  SpO2: 100%    Subjective Assessment - 03/18/18 0954    Subjective  Pt reports she is feeling tired today because she did not get her usual amount of sleep last night.  She denies pain.  She says she did not receive the test with her HEP last session but was completing some of her HEP from memory.     Pertinent History  Patient is a 52 y.o. female with history of strokes in 2017 and 2018 reporting left sided weakness and difficulty with ambulation. Pt is legally blind and uses a cane for assistance with ambulation. PMH is significant for diabetes type 2, hyperlipidemia, hypertension, and  obesity. Pt demonstrates LE > UE weakness on the left with general deconditioning and unsteadiness when standing.     Limitations  Standing;Walking;House hold activities    How long can you sit comfortably?  NA    How long can you stand comfortably?  Roughly 5 min    How long can you walk comfortably?  Few minutes    Diagnostic tests  MRI in 2017- left pontine infarct, infarct anterior margin of posterior limb of left internal capsule, right MCA infarct    Patient Stated Goals  Not having to use cane anymore    Currently in Pain?  No/denies    Pain Onset  Today        TREATMENT   Therapeutic Activity:  Pt requires light min assist for sit<>stand and stand pivot transfer from transfer chair to mat table and again for pivot from mat table to transfer chair.  Pt requires min assist to bring BLEs into bed for supine>sit.   Therapeutic Exercise:  Seated marching 2x15 each LE. Cues for increased hip F as pt with minimal foot clearance initially. Cues to keep hand in lap for greater core recruitment. (added to HEP)  LAQ x12, x15 each LE (added to HEP)  Bil hip Adduction  pillow squeeze isometric with 5 second holds x10 in sitting  SLR x10 each LE with extensor lag noted BLE. Pt requires several rest breaks to achieve 10 reps each LE. (added to HEP)  Supine hip Abd 2x10 each LE (added to HEP)  SAQ 2x10 each LE with cues for full E and with 5 second holds  Hooklying Bil hip Abd/ER with GTB around knees x20                         PT Education - 03/18/18 0922    Education Details  Exercise technique    Person(s) Educated  Patient    Methods  Explanation;Demonstration;Verbal cues    Comprehension  Verbalized understanding;Verbal cues required;Returned demonstration;Need further instruction       PT Short Term Goals - 03/05/18 1007      PT SHORT TERM GOAL #1   Title  Patient will increase BLE gross strength to 4+/5 as to improve functional strength for independent  gait, increased standing tolerance and increased ADL ability.    Baseline  grossly 3+ to 4- throughout LE    Time  4    Period  Weeks    Status  New    Target Date  04/02/18      PT SHORT TERM GOAL #2   Title  Pt will increase standing tolerance time to at least 5 min to improve ability to perform ADLs such as preparing a meal.     Baseline  only able to stand for approximately 1 min;     Time  4    Period  Weeks    Status  New    Target Date  04/02/18        PT Long Term Goals - 03/05/18 1009      PT LONG TERM GOAL #1   Title  Patient will be independent in home exercise program to improve strength/mobility for better functional independence with ADLs.    Baseline  does not have a HEP    Time  8    Period  Weeks    Status  New    Target Date  04/30/18      PT LONG TERM GOAL #2   Title  Pt will improve Berg Balance Assessment score by 7 points to decrease fall risk in home and community environment.     Baseline  31/56    Time  8    Period  Weeks    Status  New    Target Date  04/30/18      PT LONG TERM GOAL #3   Title  Pt will increase gait speed by 0.5 m/s to improve ambulation in the home and community environment and to reduce fall risk.     Baseline  0.27 m/s with SPC    Time  8    Period  Weeks    Status  New    Target Date  04/30/18      PT LONG TERM GOAL #4   Title  Patient will decrease 5 times sit-to-stand time to < 1 min without UE support in order to demonstrate increased LE strength and endurance.    Baseline  current: 64 sec with UE pushing on chair;     Time  8    Period  Weeks    Status  New    Target Date  04/30/18            Plan -  03/18/18 0956    Clinical Impression Statement  Pt performs bed mobility and stand pivot transfers with less assist required today.  She does continue to demonstrate fatigue with strengthening exercises, especially those requiring hip flexion (marching and SLR).  Pt demonstrates decreased knee E with hold during  SAQ this session. Pt will benefit from continued skilled PT interventions for improved strength and independence.      Rehab Potential  Fair    Clinical Impairments Affecting Rehab Potential  (+) family support, motivation, no recent falls (-) obesity, co-morbidities    PT Frequency  2x / week    PT Duration  8 weeks    PT Treatment/Interventions  Cryotherapy;Moist Heat;DME Instruction;Gait training;Stair training;Functional mobility training;Therapeutic activities;Therapeutic exercise;Neuromuscular re-education;Balance training;Patient/family education    PT Next Visit Plan  assess 6 min walk test, continue LE strengthening and balance training     PT Home Exercise Plan  seated marching and LAQ, SLR, supine hip Abd    Consulted and Agree with Plan of Care  Patient       Patient will benefit from skilled therapeutic intervention in order to improve the following deficits and impairments:  Abnormal gait, Decreased activity tolerance, Decreased balance, Decreased coordination, Decreased endurance, Decreased mobility, Decreased strength, Difficulty walking, Pain  Visit Diagnosis: Other abnormalities of gait and mobility  Muscle weakness (generalized)  Unsteadiness on feet     Problem List Patient Active Problem List   Diagnosis Date Noted  . Cerebral infarction (HCC) 08/14/2016  . Acute CVA (cerebrovascular accident) (HCC) 08/14/2016  . Delirium due to another medical condition 08/13/2016  . Urinary tract infection 08/13/2016  . Right foot infection 05/11/2016  . Foot abscess, right 05/11/2016  . Anxiety and depression 12/02/2015  . CVA (cerebral infarction) 11/27/2015  . Epilepsy (HCC) 06/17/2015  . 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 Headache 05/19/2015  . Weakness of left side of body 05/19/2015  . History of stroke 05/19/2015  . Severe obesity (BMI >= 40) (HCC) 03/19/2014  . HTN (hypertension)   . Diabetes (HCC)   . Hyperlipidemia   . [name] (transient ischemic attack)  03/17/2014    Encarnacion Chu PT, DPT 03/18/2018, 10:15 AM   [facility] MAIN [facility] SERVICES [address] [place], [place], [address] Phone: [phone]   Fax:  [phone]  Name: [name] MRN: [id number] Date of Birth: 11-11-1965

## 2018-03-25 ENCOUNTER — Ambulatory Visit: Payer: BLUE CROSS/BLUE SHIELD | Admitting: Physical Therapy

## 2018-03-27 ENCOUNTER — Encounter: Payer: Self-pay | Admitting: Physical Therapy

## 2018-03-27 ENCOUNTER — Ambulatory Visit: Payer: BLUE CROSS/BLUE SHIELD | Admitting: Physical Therapy

## 2018-03-27 DIAGNOSIS — R2689 Other abnormalities of gait and mobility: Secondary | ICD-10-CM | POA: Diagnosis not present

## 2018-03-27 DIAGNOSIS — R2681 Unsteadiness on feet: Secondary | ICD-10-CM | POA: Diagnosis present

## 2018-03-27 DIAGNOSIS — M6281 Muscle weakness (generalized): Secondary | ICD-10-CM | POA: Diagnosis present

## 2018-03-27 NOTE — Therapy (Signed)
[facility] MAIN [facility] SERVICES [address] [place], [place], [address] Phone: [phone]   Fax:  [phone]  Physical Therapy Treatment  Patient Details  Name: Collin Hendley MRN: [id number] Date of Birth: [birth year] Referring Provider: Dr. Malvin Johns   Encounter Date: 03/27/2018  PT End of Session - 03/27/18 0847    Visit Number  4    Number of Visits  17    Date for PT Re-Evaluation  04/30/18    Authorization Type  progress note 4/10    PT Start Time  0846    PT Stop Time  0932    PT Time Calculation (min)  46 min    Equipment Utilized During Treatment  Gait belt    Activity Tolerance  Patient tolerated treatment well;Patient limited by fatigue    Behavior During Therapy  [facility] for tasks assessed/performed       Past Medical History:  Diagnosis Date  . Allergic rhinitis   . Chickenpox   . Depression   . Diabetes (HCC)   . Epilepsy (HCC)   . Headache   . HTN (hypertension)   . Hyperlipidemia   . Seizures (HCC)   . Stroke (HCC)   . Urinary incontinence     Past Surgical History:  Procedure Laterality Date  . CESAREAN SECTION    . INCISION AND DRAINAGE Right 05/13/2016   Procedure: INCISION AND DRAINAGE;  Surgeon: Gwyneth Revels, DPM;  Location: [facility] ORS;  Service: Podiatry;  Laterality: Right;    There were no vitals filed for this visit.  Subjective Assessment - 03/27/18 0853    Subjective  Pt reports she has been completing her HEP 2 days/wk.  Pt reports she is doing well.  Her legs were sore after last session but not bad.  No pain currently.  Pt says she is tired because she had to wake up early to come to this appointment.     Pertinent History  Patient is a 52 y.o. female with history of strokes in 2017 and 2018 reporting left sided weakness and difficulty with ambulation. Pt is legally blind and uses a cane for assistance with ambulation. PMH is significant for diabetes type 2, hyperlipidemia, hypertension, and obesity. Pt  demonstrates LE > UE weakness on the left with general deconditioning and unsteadiness when standing.     Limitations  Standing;Walking;House hold activities    How long can you sit comfortably?  NA    How long can you stand comfortably?  Roughly 5 min    How long can you walk comfortably?  Few minutes    Diagnostic tests  MRI in 2017- left pontine infarct, infarct anterior margin of posterior limb of left internal capsule, right MCA infarct    Patient Stated Goals  Not having to use cane anymore    Currently in Pain?  No/denies    Pain Onset  Today         TREATMENT   Therapeutic Activity:   Pt requires light min assist for sit<>stand and stand pivot transfer from transfer chair to mat table and again for pivot from mat table to transfer chair.   Pt requires min guard for sit>supine and mod assist for supine>sit to elevate trunk.   Sit<>Stand from elevated mat table: 2x5 without UE support. Pt reports and demonstrates fatigue on last rep of each set.       Therapeutic Exercise:   Seated marching 2x15 each LE   SLR x10 each LE with extensor lag  noted BLE. Pt requires several rest breaks to achieve 10 reps each LE   Hooklying Bil hip Abd/ER with GTB around knees 2x10. Cues for glute squeeze for greater glute recruitment. Pt required rest break between sets due to fatigue.   SAQ x10 each LE with cues for full E and with 5 second holds   Forward ambulation in // bars with BUE support x8 lengths   Side stepping in // bars with BUE support x8 lengths. Cues to keep feet pointed forward for sideways walking and cues to keep feet from shuffling.   Marching in standing with BUE supported on // bars 2x10 each LE with cues to bring knees up as high as possible.                        PT Education - 03/27/18 0846    Education Details  Exercise technique    Person(s) Educated  Patient    Methods  Explanation;Demonstration;Verbal cues    Comprehension   Verbalized understanding;Returned demonstration;Verbal cues required;Need further instruction       PT Short Term Goals - 03/05/18 1007      PT SHORT TERM GOAL #1   Title  Patient will increase BLE gross strength to 4+/5 as to improve functional strength for independent gait, increased standing tolerance and increased ADL ability.    Baseline  grossly 3+ to 4- throughout LE    Time  4    Period  Weeks    Status  New    Target Date  04/02/18      PT SHORT TERM GOAL #2   Title  Pt will increase standing tolerance time to at least 5 min to improve ability to perform ADLs such as preparing a meal.     Baseline  only able to stand for approximately 1 min;     Time  4    Period  Weeks    Status  New    Target Date  04/02/18        PT Long Term Goals - 03/05/18 1009      PT LONG TERM GOAL #1   Title  Patient will be independent in home exercise program to improve strength/mobility for better functional independence with ADLs.    Baseline  does not have a HEP    Time  8    Period  Weeks    Status  New    Target Date  04/30/18      PT LONG TERM GOAL #2   Title  Pt will improve Berg Balance Assessment score by 7 points to decrease fall risk in home and community environment.     Baseline  31/56    Time  8    Period  Weeks    Status  New    Target Date  04/30/18      PT LONG TERM GOAL #3   Title  Pt will increase gait speed by 0.5 m/s to improve ambulation in the home and community environment and to reduce fall risk.     Baseline  0.27 m/s with SPC    Time  8    Period  Weeks    Status  New    Target Date  04/30/18      PT LONG TERM GOAL #4   Title  Patient will decrease 5 times sit-to-stand time to < 1 min without UE support in order to demonstrate increased LE strength and endurance.  Baseline  current: 64 sec with UE pushing on chair;     Time  8    Period  Weeks    Status  New    Target Date  04/30/18            Plan - 03/27/18 0855    Clinical  Impression Statement  Pt was able to perform sit<>stand from elevated mat table without UE support but with significant fatigue after 5 reps.  She demonstrates and reports fatigue with Bil hip Abd/ER exercise with addition of glute squeezes this session. Pt was able to perform sit>supine with min guard this session but does require mod assist for supine>sit. Practiced ambulating in // bars for improved endurance. Pt reports fatigue and soreness in Bil thighs after completing sideways ambulation. Pt will benefit from continued skilled PT interventions for improved strength and independence.      Rehab Potential  Fair    Clinical Impairments Affecting Rehab Potential  (+) family support, motivation, no recent falls (-) obesity, co-morbidities    PT Frequency  2x / week    PT Duration  8 weeks    PT Treatment/Interventions  Cryotherapy;Moist Heat;DME Instruction;Gait training;Stair training;Functional mobility training;Therapeutic activities;Therapeutic exercise;Neuromuscular re-education;Balance training;Patient/family education    PT Next Visit Plan  assess 6 min walk test, continue LE strengthening and balance training     PT Home Exercise Plan  seated marching and LAQ, SLR, supine hip Abd    Consulted and Agree with Plan of Care  Patient       Patient will benefit from skilled therapeutic intervention in order to improve the following deficits and impairments:  Abnormal gait, Decreased activity tolerance, Decreased balance, Decreased coordination, Decreased endurance, Decreased mobility, Decreased strength, Difficulty walking, Pain  Visit Diagnosis: Other abnormalities of gait and mobility  Muscle weakness (generalized)  Unsteadiness on feet     Problem List Patient Active Problem List   Diagnosis Date Noted  . Cerebral infarction (HCC) 08/14/2016  . Acute CVA (cerebrovascular accident) (HCC) 08/14/2016  . Delirium due to another medical condition 08/13/2016  . Urinary tract infection  08/13/2016  . Right foot infection 05/11/2016  . Foot abscess, right 05/11/2016  . Anxiety and depression 12/02/2015  . CVA (cerebral infarction) 11/27/2015  . Epilepsy (HCC) 06/17/2015  . 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 Headache 05/19/2015  . Weakness of left side of body 05/19/2015  . History of stroke 05/19/2015  . Severe obesity (BMI >= 40) (HCC) 03/19/2014  . HTN (hypertension)   . Diabetes (HCC)   . Hyperlipidemia   . [name] (transient ischemic attack) 03/17/2014    Encarnacion ChuAshley Adira Limburg PT, DPT 03/27/2018, 9:33 AM  [place] [facility] [address] [place], [place], [address] Phone: [phone]   Fax:  [phone]  Name: Beatrix Shipperndrea Goforth MRN: [id number] Date of Birth: 12/20/1965

## 2018-04-01 ENCOUNTER — Ambulatory Visit: Payer: BLUE CROSS/BLUE SHIELD | Admitting: Physical Therapy

## 2018-04-01 ENCOUNTER — Encounter: Payer: Self-pay | Admitting: Physical Therapy

## 2018-04-01 DIAGNOSIS — R2689 Other abnormalities of gait and mobility: Secondary | ICD-10-CM

## 2018-04-01 DIAGNOSIS — R2681 Unsteadiness on feet: Secondary | ICD-10-CM

## 2018-04-01 DIAGNOSIS — M6281 Muscle weakness (generalized): Secondary | ICD-10-CM

## 2018-04-01 NOTE — Addendum Note (Signed)
Addended by: Zettie PhoROTTER, Tempie Gibeault E on: 04/01/2018 12:03 PM   Modules accepted: Orders

## 2018-04-01 NOTE — Therapy (Signed)
[facility] [address] [place], [place], [address] Phone: [phone]   Fax:  [phone]  Physical Therapy Treatment  Patient Details  Name: [name] Shipperndrea Seybold MRN: [id number] Date of Birth: 01/31/1966 Referring Provider: Dr. Malvin JohnsPotter   Encounter Date: 04/01/2018  PT End of Session - 04/01/18 0854    Visit Number  5    Number of Visits  17    Date for PT Re-Evaluation  04/30/18    Authorization Type  progress note 5/10    PT Start Time  0854    PT Stop Time  0935    PT Time Calculation (min)  41 min    Equipment Utilized During Treatment  Gait belt    Activity Tolerance  Patient tolerated treatment well;Patient limited by fatigue    Behavior During Therapy  [facility] for tasks assessed/performed       Past Medical History:  Diagnosis Date  . Allergic rhinitis   . Chickenpox   . Depression   . Diabetes (HCC)   . Epilepsy (HCC)   . Headache   . HTN (hypertension)   . Hyperlipidemia   . Seizures (HCC)   . Stroke (HCC)   . Urinary incontinence     Past Surgical History:  Procedure Laterality Date  . CESAREAN SECTION    . INCISION AND DRAINAGE Right 05/13/2016   Procedure: INCISION AND DRAINAGE;  Surgeon: Gwyneth RevelsJustin Fowler, DPM;  Location: [facility] ORS;  Service: Podiatry;  Laterality: Right;    There were no vitals filed for this visit.  Subjective Assessment - 04/01/18 0856    Subjective  Pt reports she is doing well.  Pt is doing her HEP 5 days/wk without questions or concerns.  Pt felt tired after last session.     Pertinent History  Patient is a 52 y.o. female with history of strokes in 2017 and 2018 reporting left sided weakness and difficulty with ambulation. Pt is legally blind and uses a cane for assistance with ambulation. PMH is significant for diabetes type 2, hyperlipidemia, hypertension, and obesity. Pt demonstrates LE > UE weakness on the left with general deconditioning and unsteadiness when standing.     Limitations   Standing;Walking;House hold activities    How long can you sit comfortably?  NA    How long can you stand comfortably?  Roughly 5 min    How long can you walk comfortably?  Few minutes    Diagnostic tests  MRI in 2017- left pontine infarct, infarct anterior margin of posterior limb of left internal capsule, right MCA infarct    Patient Stated Goals  Not having to use cane anymore    Currently in Pain?  No/denies       TREATMENT   Therapeutic Exercise:  Sit<>Stand from elevated mat table: 2x10 without UE support. Cues for improved eccentric control.  SLR x10 each LE initially with extensor lag noted BLE. Pt able to correct this with one verbal cue. No rest break required this session.  Hooklying Bil hip Abd/ER with GTB around knees 2x10. Cues for glute squeeze for greater glute recruitment.  SAQ x10 each LE with cues for full E and with 5 second holds. Repeated with 2# ankle weights  Seated marching 2x15 each LE  Forward ambulation in // bars with BUE support x4 lengths  Forward ambulation in // bars with BUE support x4 lengths  Side stepping in // bars with BUE support x6 lengths. Cues to keep feet pointed forward for  sideways walking and cues to keep feet from shuffling.  Marching in standing with BUE supported on // bars 2x10 each LE with cues to bring knees up as high as possible.  Mini squats with BUE support in // bars x10  Forward and backward stepping over hurdle with BUE support, pt demonstrates instability with single UE support. x5                          PT Education - 04/01/18 0854    Education Details  Exercise technique    Person(s) Educated  Patient    Methods  Explanation;Demonstration;Verbal cues    Comprehension  Verbalized understanding;Returned demonstration;Verbal cues required;Need further instruction       PT Short Term Goals - 03/05/18 1007      PT SHORT TERM GOAL #1   Title  Patient will increase BLE gross strength to 4+/5 as to  improve functional strength for independent gait, increased standing tolerance and increased ADL ability.    Baseline  grossly 3+ to 4- throughout LE    Time  4    Period  Weeks    Status  New    Target Date  04/02/18      PT SHORT TERM GOAL #2   Title  Pt will increase standing tolerance time to at least 5 min to improve ability to perform ADLs such as preparing a meal.     Baseline  only able to stand for approximately 1 min;     Time  4    Period  Weeks    Status  New    Target Date  04/02/18        PT Long Term Goals - 03/05/18 1009      PT LONG TERM GOAL #1   Title  Patient will be independent in home exercise program to improve strength/mobility for better functional independence with ADLs.    Baseline  does not have a HEP    Time  8    Period  Weeks    Status  New    Target Date  04/30/18      PT LONG TERM GOAL #2   Title  Pt will improve Berg Balance Assessment score by 7 points to decrease fall risk in home and community environment.     Baseline  31/56    Time  8    Period  Weeks    Status  New    Target Date  04/30/18      PT LONG TERM GOAL #3   Title  Pt will increase gait speed by 0.5 m/s to improve ambulation in the home and community environment and to reduce fall risk.     Baseline  0.27 m/s with SPC    Time  8    Period  Weeks    Status  New    Target Date  04/30/18      PT LONG TERM GOAL #4   Title  Patient will decrease 5 times sit-to-stand time to < 1 min without UE support in order to demonstrate increased LE strength and endurance.    Baseline  current: 64 sec with UE pushing on chair;     Time  8    Period  Weeks    Status  New    Target Date  04/30/18            Plan - 04/01/18 0857    Clinical Impression Statement  Pt demonstrated ability to perform sit>supine without physical assist this session.  Pt able to perform sit<>stand x10 consecutively.  Cues provided for improved eccentric control which the pt is able to demonstrate back  to this therapist.  Pt was able to perform SLR without extensor lag this session and without rest breaks between reps. Pt was able to perform SAQ with added 2# ankle weights this session. Pt does continue to fatigue quickly with ambulatory endurance training in // bars.   Pt will benefit from continued skilled PT interventions for improved strength, balance, and to decrease risk of falling.     Rehab Potential  Fair    Clinical Impairments Affecting Rehab Potential  (+) family support, motivation, no recent falls (-) obesity, co-morbidities    PT Frequency  2x / week    PT Duration  8 weeks    PT Treatment/Interventions  Cryotherapy;Moist Heat;DME Instruction;Gait training;Stair training;Functional mobility training;Therapeutic activities;Therapeutic exercise;Neuromuscular re-education;Balance training;Patient/family education    PT Next Visit Plan  assess 6 min walk test, continue LE strengthening and balance training     PT Home Exercise Plan  seated marching and LAQ, SLR, supine hip Abd    Consulted and Agree with Plan of Care  Patient       Patient will benefit from skilled therapeutic intervention in order to improve the following deficits and impairments:  Abnormal gait, Decreased activity tolerance, Decreased balance, Decreased coordination, Decreased endurance, Decreased mobility, Decreased strength, Difficulty walking, Pain  Visit Diagnosis: Other abnormalities of gait and mobility  Muscle weakness (generalized)  Unsteadiness on feet     Problem List Patient Active Problem List   Diagnosis Date Noted  . Cerebral infarction (HCC) 08/14/2016  . Acute CVA (cerebrovascular accident) (HCC) 08/14/2016  . Delirium due to another medical condition 08/13/2016  . Urinary tract infection 08/13/2016  . Right foot infection 05/11/2016  . Foot abscess, right 05/11/2016  . Anxiety and depression 12/02/2015  . CVA (cerebral infarction) 11/27/2015  . Epilepsy (HCC) 06/17/2015  .  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 Headache 05/19/2015  . Weakness of left side of body 05/19/2015  . History of stroke 05/19/2015  . Severe obesity (BMI >= 40) (HCC) 03/19/2014  . HTN (hypertension)   . Diabetes (HCC)   . Hyperlipidemia   . [name] (transient ischemic attack) 03/17/2014    Encarnacion Chu PT, DPT 04/01/2018, 9:40 AM  [place] [facility] MAIN [facility] SERVICES [address] [place], [place], [address] Phone: [phone]   Fax:  [phone]  Name: [name] MRN: [id number] Date of Birth: September 29, 1965

## 2018-04-03 ENCOUNTER — Ambulatory Visit: Payer: BLUE CROSS/BLUE SHIELD | Admitting: Physical Therapy

## 2018-04-03 ENCOUNTER — Encounter: Payer: Self-pay | Admitting: Physical Therapy

## 2018-04-03 DIAGNOSIS — R2681 Unsteadiness on feet: Secondary | ICD-10-CM

## 2018-04-03 DIAGNOSIS — M6281 Muscle weakness (generalized): Secondary | ICD-10-CM

## 2018-04-03 DIAGNOSIS — R2689 Other abnormalities of gait and mobility: Secondary | ICD-10-CM | POA: Diagnosis not present

## 2018-04-03 NOTE — Therapy (Addendum)
[place] [facility] MAIN [facility] SERVICES [address] [place], [place], [address] Phone: [phone]   Fax:  [phone]  Physical Therapy Treatment  Patient Details  Name: [name] MRN: [id number] Date of Birth: [birth year] Referring Provider: Dr. Malvin Johns   Encounter Date: 04/03/2018  PT End of Session - 04/03/18 0856    Visit Number  6    Number of Visits  17    Date for PT Re-Evaluation  04/30/18    Authorization Type  progress note 6/10    PT Start Time  0847    PT Stop Time  0930    PT Time Calculation (min)  43 min    Equipment Utilized During Treatment  Gait belt    Activity Tolerance  Patient tolerated treatment well;Patient limited by fatigue    Behavior During Therapy  [facility] for tasks assessed/performed       Past Medical History:  Diagnosis Date  . Allergic rhinitis   . Chickenpox   . Depression   . Diabetes (HCC)   . Epilepsy (HCC)   . Headache   . HTN (hypertension)   . Hyperlipidemia   . Seizures (HCC)   . Stroke (HCC)   . Urinary incontinence     Past Surgical History:  Procedure Laterality Date  . CESAREAN SECTION    . INCISION AND DRAINAGE Right 05/13/2016   Procedure: INCISION AND DRAINAGE;  Surgeon: Gwyneth Revels, DPM;  Location: [facility] ORS;  Service: Podiatry;  Laterality: Right;    There were no vitals filed for this visit.  Subjective Assessment - 04/03/18 0855    Subjective  Patient states she is doing well; reports doing HEP 5 days/week. Pt felt tired and experienced some soreness after last session.     Pertinent History  Patient is a 52 y.o. female with history of strokes in 2017 and 2018 reporting left sided weakness and difficulty with ambulation. Pt is legally blind and uses a cane for assistance with ambulation. PMH is significant for diabetes type 2, hyperlipidemia, hypertension, and obesity. Pt demonstrates LE > UE weakness on the left with general deconditioning and unsteadiness when standing.     Limitations  Standing;Walking;House hold activities    How long can you sit comfortably?  NA    How long can you stand comfortably?  Roughly 5 min    How long can you walk comfortably?  Few minutes    Diagnostic tests  MRI in 2017- left pontine infarct, infarct anterior margin of posterior limb of left internal capsule, right MCA infarct    Patient Stated Goals  Not having to use cane anymore    Currently in Pain?  No/denies       Treatment Pt required CGA-min A for sit <> stand from transfer chair and stand pivot transfer to mat table and back from mat table to transfer chair   Supine, SLR x10 bilaterally; requires mod-max VCs to continue exercise as well as for proper technique and sequencing; requires rest breaks after ~3-4 repetitions to complete 10 reps total  Hooklying, bilateral hip Abd/ER with Green TBand around knees 2x10; VCs for glute squeeze for greater glute recruitment.   Supine > sit with min A for upper body coming upright, VCs for rolling and swinging legs off mat table prior to coming to sit   SAQ x10 with 2# ankle weights each LE with VCs for full extension and 5 second holds;   Seated marching x15 each LE with 2# ankle weights; VCs  for lifting knee high enough and tactile cuing for where the knee should come up to when marching  Forward ambulation in // bars with BUE support x8 lengths  Side stepping in // bars with BUE support x2 lengths; VCs for keeping toes facing forwards and really taking side steps rather than rotating foot to take a forward step                    PT Education - 04/03/18 0856    Education Details  Exercise technique    Person(s) Educated  Patient    Methods  Explanation;Demonstration;Verbal cues    Comprehension  Verbalized understanding;Verbal cues required;Returned demonstration;Need further instruction       PT Short Term Goals - 03/05/18 1007      PT SHORT TERM GOAL #1   Title  Patient will increase BLE gross  strength to 4+/5 as to improve functional strength for independent gait, increased standing tolerance and increased ADL ability.    Baseline  grossly 3+ to 4- throughout LE    Time  4    Period  Weeks    Status  New    Target Date  04/02/18      PT SHORT TERM GOAL #2   Title  Pt will increase standing tolerance time to at least 5 min to improve ability to perform ADLs such as preparing a meal.     Baseline  only able to stand for approximately 1 min;     Time  4    Period  Weeks    Status  New    Target Date  04/02/18        PT Long Term Goals - 03/05/18 1009      PT LONG TERM GOAL #1   Title  Patient will be independent in home exercise program to improve strength/mobility for better functional independence with ADLs.    Baseline  does not have a HEP    Time  8    Period  Weeks    Status  New    Target Date  04/30/18      PT LONG TERM GOAL #2   Title  Pt will improve Berg Balance Assessment score by 7 points to decrease fall risk in home and community environment.     Baseline  31/56    Time  8    Period  Weeks    Status  New    Target Date  04/30/18      PT LONG TERM GOAL #3   Title  Pt will increase gait speed by 0.5 m/s to improve ambulation in the home and community environment and to reduce fall risk.     Baseline  0.27 m/s with SPC    Time  8    Period  Weeks    Status  New    Target Date  04/30/18      PT LONG TERM GOAL #4   Title  Patient will decrease 5 times sit-to-stand time to < 1 min without UE support in order to demonstrate increased LE strength and endurance.    Baseline  current: 64 sec with UE pushing on chair;     Time  8    Period  Weeks    Status  New    Target Date  04/30/18            Plan - 04/03/18 0856    Clinical Impression Statement  Patient tolerated therapy session well.  Pt required CGA-min A for transfers and bed mobility. Pt required frequent rest breaks within sets of exercises due to fatigue; VCs required for all exercises  to assist in counting and proper technique. Pt performed ambulation in // bars x8 lengths, which is double last visit, to demonstrate increased endurance and activity tolerance in standing. Pt performed side stepping x2 lengths and required VCs to keep toes pointing forward and supervision for safety. Pt will continue to benefit from skilled PT for improvements in balance, strength, and gait safety.    Rehab Potential  Fair    Clinical Impairments Affecting Rehab Potential  (+) family support, motivation, no recent falls (-) obesity, co-morbidities    PT Frequency  2x / week    PT Duration  8 weeks    PT Treatment/Interventions  Cryotherapy;Moist Heat;DME Instruction;Gait training;Stair training;Functional mobility training;Therapeutic activities;Therapeutic exercise;Neuromuscular re-education;Balance training;Patient/family education    PT Next Visit Plan  assess 6 min walk test, continue LE strengthening and balance training     PT Home Exercise Plan  seated marching and LAQ, SLR, supine hip Abd    Consulted and Agree with Plan of Care  Patient       Patient will benefit from skilled therapeutic intervention in order to improve the following deficits and impairments:  Abnormal gait, Decreased activity tolerance, Decreased balance, Decreased coordination, Decreased endurance, Decreased mobility, Decreased strength, Difficulty walking, Pain  Visit Diagnosis: Other abnormalities of gait and mobility  Muscle weakness (generalized)  Unsteadiness on feet     Problem List Patient Active Problem List   Diagnosis Date Noted  . Cerebral infarction (HCC) 08/14/2016  . Acute CVA (cerebrovascular accident) (HCC) 08/14/2016  . Delirium due to another medical condition 08/13/2016  . Urinary tract infection 08/13/2016  . Right foot infection 05/11/2016  . Foot abscess, right 05/11/2016  . Anxiety and depression 12/02/2015  . CVA (cerebral infarction) 11/27/2015  . Epilepsy (HCC) 06/17/2015  .  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 Headache 05/19/2015  . Weakness of left side of body 05/19/2015  . History of stroke 05/19/2015  . Severe obesity (BMI >= 40) (HCC) 03/19/2014  . HTN (hypertension)   . Diabetes (HCC)   . Hyperlipidemia   . [name] (transient ischemic attack) 03/17/2014   Dossie ArbourKayla Devory Mckinzie, SPT This entire session was performed under direct supervision and direction of a licensed therapist/therapist assistant . I have personally read, edited and approve of the note as written.  Trotter,Margaret PT, DPT 04/03/2018, 11:48 AM  [place] [facility] [address] [place], [place], [address] Phone: [phone]   Fax:  [phone]  Name: Beatrix Shipperndrea Stephani MRN: [id number] Date of Birth: 02/12/1966

## 2018-04-08 ENCOUNTER — Ambulatory Visit: Payer: BLUE CROSS/BLUE SHIELD | Admitting: Physical Therapy

## 2018-04-08 ENCOUNTER — Encounter: Payer: Self-pay | Admitting: Physical Therapy

## 2018-04-08 DIAGNOSIS — M6281 Muscle weakness (generalized): Secondary | ICD-10-CM

## 2018-04-08 DIAGNOSIS — R2689 Other abnormalities of gait and mobility: Secondary | ICD-10-CM

## 2018-04-08 DIAGNOSIS — R2681 Unsteadiness on feet: Secondary | ICD-10-CM

## 2018-04-08 NOTE — Therapy (Addendum)
[place] [facility] MAIN [facility] [address] [place], [place], [address] Phone: [phone]   Fax:  [phone]  Physical Therapy Treatment  Patient Details  Name: [name] Shipperndrea Peabody MRN: [id number] Date of Birth: 03/13/1966 Referring Provider: Dr. Malvin JohnsPotter   Encounter Date: 04/08/2018  PT End of Session - 04/08/18 0851    Visit Number  7    Number of Visits  17    Date for PT Re-Evaluation  04/30/18    Authorization Type  progress note 7/10    PT Start Time  0848    PT Stop Time  0930    PT Time Calculation (min)  42 min    Equipment Utilized During Treatment  Gait belt    Activity Tolerance  Patient tolerated treatment well;Patient limited by fatigue    Behavior During Therapy  [facility] for tasks assessed/performed       Past Medical History:  Diagnosis Date  . Allergic rhinitis   . Chickenpox   . Depression   . Diabetes (HCC)   . Epilepsy (HCC)   . Headache   . HTN (hypertension)   . Hyperlipidemia   . Seizures (HCC)   . Stroke (HCC)   . Urinary incontinence     Past Surgical History:  Procedure Laterality Date  . CESAREAN SECTION    . INCISION AND DRAINAGE Right 05/13/2016   Procedure: INCISION AND DRAINAGE;  Surgeon: Gwyneth RevelsJustin Fowler, DPM;  Location: [facility] ORS;  Service: Podiatry;  Laterality: Right;    There were no vitals filed for this visit.  Subjective Assessment - 04/08/18 0851    Subjective  Patient reports she had a good weekend; states she had a lot of soreness after last session. Pt states she has increased back pain (8/10) following the way she was 'sitting in the chair.' Pt states 'it hurts when I don't have anything behind my back.'     Pertinent History  Patient is a 52 y.o. female with history of strokes in 2017 and 2018 reporting left sided weakness and difficulty with ambulation. Pt is legally blind and uses a cane for assistance with ambulation. PMH is significant for diabetes type 2, hyperlipidemia, hypertension, and  obesity. Pt demonstrates LE > UE weakness on the left with general deconditioning and unsteadiness when standing.     Limitations  Standing;Walking;House hold activities    How long can you sit comfortably?  NA    How long can you stand comfortably?  Roughly 5 min    How long can you walk comfortably?  Few minutes    Diagnostic tests  MRI in 2017- left pontine infarct, infarct anterior margin of posterior limb of left internal capsule, right MCA infarct    Patient Stated Goals  Not having to use cane anymore    Currently in Pain?  Yes    Pain Score  8     Pain Location  Back    Pain Orientation  Lower;Mid    Pain Descriptors / Indicators  Aching;Sore    Pain Type  Acute pain    Pain Onset  Today    Pain Frequency  Intermittent    Aggravating Factors   sitting without something on her back    Pain Relieving Factors  sitting back    Multiple Pain Sites  No        Treatment Pt required CGA-min A for sit <> stand from transfer chair and stand pivot transfer to mat table and back from mat table to  transfer chair  Forward ambulation in // bars with BUE support x4 lengths; pt reported back pain increased and unable to continue with ambulation in // bars    Therex with moist heat to low back: Supine, SAQs x15 reps bilaterally with 2# ankle weights, VCs for proper technique and sequencing of exercise as well as reaching full extension  Hooklying, bilateral hip Abd/ER with Chilton Si TBand around knees x15; VCs for glute squeeze for greater glute recruitment as well as proper technique and sequencing of exercise  Hooklying alternating marching x10 reps bilaterally; VCs for proper technique and counting as well as for abdominal engagement  Supine > sit with min A for upper body coming upright, VCs for rolling and swinging legs off mat table prior to coming to sit               PT Education - 04/08/18 0850    Education Details  Exercise technique    Person(s) Educated  Patient     Methods  Explanation;Demonstration;Verbal cues    Comprehension  Verbalized understanding;Returned demonstration;Verbal cues required;Need further instruction       PT Short Term Goals - 03/05/18 1007      PT SHORT TERM GOAL #1   Title  Patient will increase BLE gross strength to 4+/5 as to improve functional strength for independent gait, increased standing tolerance and increased ADL ability.    Baseline  grossly 3+ to 4- throughout LE    Time  4    Period  Weeks    Status  New    Target Date  04/02/18      PT SHORT TERM GOAL #2   Title  Pt will increase standing tolerance time to at least 5 min to improve ability to perform ADLs such as preparing a meal.     Baseline  only able to stand for approximately 1 min;     Time  4    Period  Weeks    Status  New    Target Date  04/02/18        PT Long Term Goals - 03/05/18 1009      PT LONG TERM GOAL #1   Title  Patient will be independent in home exercise program to improve strength/mobility for better functional independence with ADLs.    Baseline  does not have a HEP    Time  8    Period  Weeks    Status  New    Target Date  04/30/18      PT LONG TERM GOAL #2   Title  Pt will improve Berg Balance Assessment score by 7 points to decrease fall risk in home and community environment.     Baseline  31/56    Time  8    Period  Weeks    Status  New    Target Date  04/30/18      PT LONG TERM GOAL #3   Title  Pt will increase gait speed by 0.5 m/s to improve ambulation in the home and community environment and to reduce fall risk.     Baseline  0.27 m/s with SPC    Time  8    Period  Weeks    Status  New    Target Date  04/30/18      PT LONG TERM GOAL #4   Title  Patient will decrease 5 times sit-to-stand time to < 1 min without UE support in order to demonstrate increased LE strength  and endurance.    Baseline  current: 64 sec with UE pushing on chair;     Time  8    Period  Weeks    Status  New    Target Date   04/30/18            Plan - 04/08/18 0851    Clinical Impression Statement  Patient tolerated therapy session well. Pt reported increase in back pain this morning following the way she was sitting in the chair; demonstrated grimacing and verbal discomfort when ambulating in // bars. Pt unable to continue after walking 4 lengths secondary to back pain. Pt agreeable to supine exercises with heat on low back; unable to perform SLR due to pain in low back region. Pt demonstrated grimacing and verbal groans of discomfort but did not report exercises were increasing pain. Pt required max encouragement to participate in therapy exercises this session. Pt required VCs for maintaining count of exercises as well as proper exercise technique and sequencing. Pt will continue to benefit from skilled PT for improvements in balance, strength, and gait safety.    Rehab Potential  Fair    Clinical Impairments Affecting Rehab Potential  (+) family support, motivation, no recent falls (-) obesity, co-morbidities    PT Frequency  2x / week    PT Duration  8 weeks    PT Treatment/Interventions  Cryotherapy;Moist Heat;DME Instruction;Gait training;Stair training;Functional mobility training;Therapeutic activities;Therapeutic exercise;Neuromuscular re-education;Balance training;Patient/family education    PT Next Visit Plan  assess 6 min walk test, continue LE strengthening and balance training     PT Home Exercise Plan  seated marching and LAQ, SLR, supine hip Abd    Consulted and Agree with Plan of Care  Patient       Patient will benefit from skilled therapeutic intervention in order to improve the following deficits and impairments:  Abnormal gait, Decreased activity tolerance, Decreased balance, Decreased coordination, Decreased endurance, Decreased mobility, Decreased strength, Difficulty walking, Pain  Visit Diagnosis: Other abnormalities of gait and mobility  Muscle weakness (generalized)  Unsteadiness  on feet     Problem List Patient Active Problem List   Diagnosis Date Noted  . Cerebral infarction (HCC) 08/14/2016  . Acute CVA (cerebrovascular accident) (HCC) 08/14/2016  . Delirium due to another medical condition 08/13/2016  . Urinary tract infection 08/13/2016  . Right foot infection 05/11/2016  . Foot abscess, right 05/11/2016  . Anxiety and depression 12/02/2015  . CVA (cerebral infarction) 11/27/2015  . Epilepsy (HCC) 06/17/2015  . 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 Headache 05/19/2015  . Weakness of left side of body 05/19/2015  . History of stroke 05/19/2015  . Severe obesity (BMI >= 40) (HCC) 03/19/2014  . HTN (hypertension)   . Diabetes (HCC)   . Hyperlipidemia   . [name] (transient ischemic attack) 03/17/2014   Dossie Arbour, SPT This entire session was performed under direct supervision and direction of a licensed therapist/therapist assistant . I have personally read, edited and approve of the note as written.  Trotter,Margaret PT, DPT 04/08/2018, 9:49 AM  [facility] MAIN [facility] SERVICES [address] [place], [place], [address] Phone: [phone]   Fax:  [phone]  Name: [name] MRN: [id number] Date of Birth: 1966-08-12

## 2018-04-15 ENCOUNTER — Encounter: Payer: Self-pay | Admitting: Physical Therapy

## 2018-04-15 ENCOUNTER — Ambulatory Visit: Payer: BLUE CROSS/BLUE SHIELD | Admitting: Physical Therapy

## 2018-04-15 DIAGNOSIS — R2681 Unsteadiness on feet: Secondary | ICD-10-CM

## 2018-04-15 DIAGNOSIS — R2689 Other abnormalities of gait and mobility: Secondary | ICD-10-CM

## 2018-04-15 DIAGNOSIS — M6281 Muscle weakness (generalized): Secondary | ICD-10-CM

## 2018-04-15 NOTE — Therapy (Signed)
[place] [facility] MAIN [facility] SERVICES [address] [place], [place], [address] Phone: [phone]   Fax:  [phone]  Physical Therapy Treatment  Patient Details  Name: [name] MRN: [id number] Date of Birth: October 06, 1965 Referring Provider: Dr. Malvin Johns   Encounter Date: 04/15/2018  PT End of Session - 04/15/18 0912    Visit Number  8    Number of Visits  17    Date for PT Re-Evaluation  04/30/18    Authorization Type  progress note 8/10    Authorization Time Period  medicaid auth 12 visits 04/08/18-05/19/18    Authorization - Visit Number  2    Authorization - Number of Visits  12    PT Start Time  0902    PT Stop Time  0945    PT Time Calculation (min)  43 min    Equipment Utilized During Treatment  Gait belt    Activity Tolerance  Patient tolerated treatment well;Patient limited by fatigue    Behavior During Therapy  [facility] for tasks assessed/performed       Past Medical History:  Diagnosis Date  . Allergic rhinitis   . Chickenpox   . Depression   . Diabetes (HCC)   . Epilepsy (HCC)   . Headache   . HTN (hypertension)   . Hyperlipidemia   . Seizures (HCC)   . Stroke (HCC)   . Urinary incontinence     Past Surgical History:  Procedure Laterality Date  . CESAREAN SECTION    . INCISION AND DRAINAGE Right 05/13/2016   Procedure: INCISION AND DRAINAGE;  Surgeon: Gwyneth Revels, DPM;  Location: [facility] ORS;  Service: Podiatry;  Laterality: Right;    There were no vitals filed for this visit.  Subjective Assessment - 04/15/18 0910    Subjective  Patient reports compliance with HEP; She reports no pain currently and denies any new falls. She reports walking approximately 20 feet, 5 times a day;     Pertinent History  Patient is a 52 y.o. female with history of strokes in 2017 and 2018 reporting left sided weakness and difficulty with ambulation. Pt is legally blind and uses a cane for assistance with ambulation. PMH is significant for diabetes  type 2, hyperlipidemia, hypertension, and obesity. Pt demonstrates LE > UE weakness on the left with general deconditioning and unsteadiness when standing.     Limitations  Standing;Walking;House hold activities    How long can you sit comfortably?  NA    How long can you stand comfortably?  Roughly 5 min    How long can you walk comfortably?  Few minutes    Diagnostic tests  MRI in 2017- left pontine infarct, infarct anterior margin of posterior limb of left internal capsule, right MCA infarct    Patient Stated Goals  Not having to use cane anymore    Currently in Pain?  No/denies    Pain Onset  Today    Multiple Pain Sites  No            Treatment Warm up on Nustep BUE/BLE level 2 x4 min with cues to increase steps per minute to >40  Standing outside parallel bars: Forward/backward walking with 1 rail assist x10 feet x5 laps with CGA to close supervision with cues to increase step length and improve erect posture for better gait techniques/safety;  Sitting with red tband around BLE: Hip flexion march x10 with cues to increase ROM for better strengthening; Hip abduction/ER x15 with min VCs to  increase ROM for strengthening; Ankle DF x10 bilaterally with cues for sequencing for better ankle strengthening;  Sit<>Stand from regular chair with 1 HHA pushing on chair x10 reps with min VCs to improve forward weight shift for better transfer safety;  Balance: Standing on airex: -alternate toe taps to 6 inch step x10 with 2-1 rail assist; -modified tandem staggered stance 10 sec hold unsupported x3 reps each foot in front;  Standing on 1/2 bolster: -heel/toe rock x10 with B rail assist with mod Vcs to increase ankle ROM for better ankle mobility; -alternate UE lift x10 reps with CGA to safety and cues for better foot placement for better ankle strategies.  Patient required min VCs for balance stability, including to increase trunk control for less loss of balance with smaller base of  support                    PT Education - 04/15/18 0912    Education Details  exercise technique, balance/gait safety, HEP reinforced;     Person(s) Educated  Patient    Methods  Explanation;Demonstration;Verbal cues    Comprehension  Verbalized understanding;Verbal cues required;Returned demonstration;Need further instruction       PT Short Term Goals - 03/05/18 1007      PT SHORT TERM GOAL #1   Title  Patient will increase BLE gross strength to 4+/5 as to improve functional strength for independent gait, increased standing tolerance and increased ADL ability.    Baseline  grossly 3+ to 4- throughout LE    Time  4    Period  Weeks    Status  New    Target Date  04/02/18      PT SHORT TERM GOAL #2   Title  Pt will increase standing tolerance time to at least 5 min to improve ability to perform ADLs such as preparing a meal.     Baseline  only able to stand for approximately 1 min;     Time  4    Period  Weeks    Status  New    Target Date  04/02/18        PT Long Term Goals - 03/05/18 1009      PT LONG TERM GOAL #1   Title  Patient will be independent in home exercise program to improve strength/mobility for better functional independence with ADLs.    Baseline  does not have a HEP    Time  8    Period  Weeks    Status  New    Target Date  04/30/18      PT LONG TERM GOAL #2   Title  Pt will improve Berg Balance Assessment score by 7 points to decrease fall risk in home and community environment.     Baseline  31/56    Time  8    Period  Weeks    Status  New    Target Date  04/30/18      PT LONG TERM GOAL #3   Title  Pt will increase gait speed by 0.5 m/s to improve ambulation in the home and community environment and to reduce fall risk.     Baseline  0.27 m/s with SPC    Time  8    Period  Weeks    Status  New    Target Date  04/30/18      PT LONG TERM GOAL #4   Title  Patient will decrease 5 times sit-to-stand time  to < 1 min without UE  support in order to demonstrate increased LE strength and endurance.    Baseline  current: 64 sec with UE pushing on chair;     Time  8    Period  Weeks    Status  New    Target Date  04/30/18            Plan - 04/15/18 0950    Clinical Impression Statement  Patient instructed in advanced LE strengthening and balance exercise. Patient required frequent rest breaks. She is slow moving requiring cues to improve sequencing and exercise performance. Patient denies any pain with exercise but does report increased fatigue. She required CGA to min A throughout session. Patient would benefit from additional skilled PT intervention to improve strength, balance and gait safety;     Rehab Potential  Fair    Clinical Impairments Affecting Rehab Potential  (+) family support, motivation, no recent falls (-) obesity, co-morbidities    PT Frequency  2x / week    PT Duration  8 weeks    PT Treatment/Interventions  Cryotherapy;Moist Heat;DME Instruction;Gait training;Stair training;Functional mobility training;Therapeutic activities;Therapeutic exercise;Neuromuscular re-education;Balance training;Patient/family education    PT Next Visit Plan  assess 6 min walk test, continue LE strengthening and balance training     PT Home Exercise Plan  seated marching and LAQ, SLR, supine hip Abd    Consulted and Agree with Plan of Care  Patient       Patient will benefit from skilled therapeutic intervention in order to improve the following deficits and impairments:  Abnormal gait, Decreased activity tolerance, Decreased balance, Decreased coordination, Decreased endurance, Decreased mobility, Decreased strength, Difficulty walking, Pain  Visit Diagnosis: Other abnormalities of gait and mobility  Muscle weakness (generalized)  Unsteadiness on feet     Problem List Patient Active Problem List   Diagnosis Date Noted  . Cerebral infarction (HCC) 08/14/2016  . Acute CVA (cerebrovascular accident) (HCC)  08/14/2016  . Delirium due to another medical condition 08/13/2016  . Urinary tract infection 08/13/2016  . Right foot infection 05/11/2016  . Foot abscess, right 05/11/2016  . Anxiety and depression 12/02/2015  . CVA (cerebral infarction) 11/27/2015  . Epilepsy (HCC) 06/17/2015  . 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 Headache 05/19/2015  . Weakness of left side of body 05/19/2015  . History of stroke 05/19/2015  . Severe obesity (BMI >= 40) (HCC) 03/19/2014  . HTN (hypertension)   . Diabetes (HCC)   . Hyperlipidemia   . [name] (transient ischemic attack) 03/17/2014    Trotter,Margaret  PT, DPT 04/15/2018, 9:58 AM  [facility] MAIN [facility] SERVICES [address] [place], [place], [address] Phone: [phone]   Fax:  [phone]  Name: [name] MRN: [id number] Date of Birth: March 29, 1966

## 2018-04-15 NOTE — Patient Instructions (Signed)
SIT TO STAND: No Device   Sit with feet shoulder-width apart, on floor.(Make sure that you are in a chair that won't move like a chair against a wall or couch etc) Lean chest forward, raise hips up from surface. Straighten hips and knees. Weight bear equally on left and right sides. 10___ reps per set, _2__ sets per day, _5__ days per week Place left leg closer to sitting surface.  Copyright  VHI. All rights reserved.  Backward Walking   Walk backward, toes of each foot coming down first. Take long, even strides. Make sure you have a clear pathway with no obstructions when you do this. Stand beside counter and walk backward  And then walk forward doing opposite directions; repeat 10 laps 2x a day at least 5 days a week.  ABDUCTION: Sitting - Exercise Ball: Resistance Band (Active)   Sitting in a chair, with feet on floor. With band tied around both legs, Lift right leg slightly and, against resistance band, draw it out to side. Complete __2_ sets of __10_ repetitions. Perform _2__ sessions per day.  Copyright  VHI. All rights reserved.  FLEXION: Sitting - Resistance Band (Active)   Sit, both feet flat. Have band tied around both legs above knees, lift right knee toward ceiling.Repeat with other knee Complete _2__ sets of _10__ repetitions. Perform _2__ sessions per day.  http://gtsc.exer.us/21   .  Copyright  VHI. All rights reserved.  FLEXION: Sitting - Resistance Band (Active)   Sit with right foot flat. Have band tied around both feet, bend ankle, bringing toes toward head. Complete __2_ sets of __10_ repetitions. Perform _2__ sessions per day.

## 2018-04-17 ENCOUNTER — Ambulatory Visit: Payer: BLUE CROSS/BLUE SHIELD | Admitting: Physical Therapy

## 2018-04-17 ENCOUNTER — Other Ambulatory Visit: Payer: Self-pay | Admitting: Family Medicine

## 2018-04-17 DIAGNOSIS — Z1231 Encounter for screening mammogram for malignant neoplasm of breast: Secondary | ICD-10-CM

## 2018-04-22 ENCOUNTER — Ambulatory Visit: Payer: BLUE CROSS/BLUE SHIELD | Attending: Neurology

## 2018-04-22 VITALS — BP 134/94 | HR 85

## 2018-04-22 DIAGNOSIS — M6281 Muscle weakness (generalized): Secondary | ICD-10-CM | POA: Diagnosis present

## 2018-04-22 DIAGNOSIS — R2681 Unsteadiness on feet: Secondary | ICD-10-CM

## 2018-04-22 DIAGNOSIS — R2689 Other abnormalities of gait and mobility: Secondary | ICD-10-CM | POA: Diagnosis not present

## 2018-04-22 NOTE — Therapy (Signed)
[facility] [address] [place], [place], [address] Phone: [phone]   Fax:  [phone]  Physical Therapy Treatment  Patient Details  Name: [name] MRN: [id number] Date of Birth: 06/03/1966 Referring Provider: Dr. Malvin JohnsPotter   Encounter Date: 04/22/2018  PT End of Session - 04/22/18 0913    Visit Number  9    Number of Visits  17    Date for PT Re-Evaluation  04/30/18    Authorization Type  progress note 9/10    Authorization Time Period  medicaid auth 12 visits 04/08/18-05/19/18, goals last updated 03/05/18    Authorization - Visit Number  2    Authorization - Number of Visits  12    PT Start Time  0900    PT Stop Time  0945    PT Time Calculation (min)  45 min    Equipment Utilized During Treatment  Gait belt    Activity Tolerance  Patient tolerated treatment well;Patient limited by fatigue    Behavior During Therapy  [facility] for tasks assessed/performed       Past Medical History:  Diagnosis Date  . Allergic rhinitis   . Chickenpox   . Depression   . Diabetes (HCC)   . Epilepsy (HCC)   . Headache   . HTN (hypertension)   . Hyperlipidemia   . Seizures (HCC)   . Stroke (HCC)   . Urinary incontinence     Past Surgical History:  Procedure Laterality Date  . CESAREAN SECTION    . INCISION AND DRAINAGE Right 05/13/2016   Procedure: INCISION AND DRAINAGE;  Surgeon: Gwyneth RevelsJustin Fowler, DPM;  Location: [facility] ORS;  Service: Podiatry;  Laterality: Right;    Vitals:   04/22/18 0909  BP: (!) 134/94  Pulse: 85  SpO2: 100%    Subjective Assessment - 04/22/18 0852    Subjective  Patient reports compliance with HEP. She reports some R hip pain upon arrival that started this morning upon waking. She rates the pain as a 5/10. Pt denies chronic pain but reports that she has had similar hip pain in the past. No falls since her last appointment. No complaints at this time.    Pertinent History  Patient is a 52 y.o. female with  history of strokes in 2017 and 2018 reporting left sided weakness and difficulty with ambulation. Pt is legally blind and uses a cane for assistance with ambulation. PMH is significant for diabetes type 2, hyperlipidemia, hypertension, and obesity. Pt demonstrates LE > UE weakness on the left with general deconditioning and unsteadiness when standing.     Limitations  Standing;Walking;House hold activities    How long can you sit comfortably?  NA    How long can you stand comfortably?  Roughly 5 min    How long can you walk comfortably?  Few minutes    Diagnostic tests  MRI in 2017- left pontine infarct, infarct anterior margin of posterior limb of left internal capsule, right MCA infarct    Patient Stated Goals  Not having to use cane anymore    Currently in Pain?  Yes    Pain Score  5     Pain Location  Hip    Pain Orientation  Right    Pain Descriptors / Indicators  Aching    Pain Type  Acute pain    Pain Onset  Today    Pain Frequency  Intermittent         TREATMENT  Ther-ex  Warm up on Nustep BUE/BLE level 2 x 5 min during history with repeated cues to increase steps per minute. Pt frequently distracted.  Sit to stand without UE support from regular height chair with Airex pad on seat 2 x 10, min VCs to improve weight shifting and sequeing;  Standing with 2# ankle weights BLE: Hip flexion march 2 x 15 with cues to increase ROM for better strengthening; Hip abduction 2 x15 with cues for proper form; Hip extension 2 x 15 with cues for proper form;  Neuromuscular Re-education  Standing on airex: Alternate toe taps to 6 inch step x10 with 2-1 rail assist; Wide stance with horizontal and vertical head turns 60s each with rest break between; Pt struggles particularly with vertical head turns;  Patient required min VCs for balance stability, including to increase trunk control for less loss of balance with smaller base of support                    PT  Education - 04/22/18 0912    Education Details  exercise form/technique    Person(s) Educated  Patient    Methods  Explanation    Comprehension  Verbalized understanding       PT Short Term Goals - 03/05/18 1007      PT SHORT TERM GOAL #1   Title  Patient will increase BLE gross strength to 4+/5 as to improve functional strength for independent gait, increased standing tolerance and increased ADL ability.    Baseline  grossly 3+ to 4- throughout LE    Time  4    Period  Weeks    Status  New    Target Date  04/02/18      PT SHORT TERM GOAL #2   Title  Pt will increase standing tolerance time to at least 5 min to improve ability to perform ADLs such as preparing a meal.     Baseline  only able to stand for approximately 1 min;     Time  4    Period  Weeks    Status  New    Target Date  04/02/18        PT Long Term Goals - 03/05/18 1009      PT LONG TERM GOAL #1   Title  Patient will be independent in home exercise program to improve strength/mobility for better functional independence with ADLs.    Baseline  does not have a HEP    Time  8    Period  Weeks    Status  New    Target Date  04/30/18      PT LONG TERM GOAL #2   Title  Pt will improve Berg Balance Assessment score by 7 points to decrease fall risk in home and community environment.     Baseline  31/56    Time  8    Period  Weeks    Status  New    Target Date  04/30/18      PT LONG TERM GOAL #3   Title  Pt will increase gait speed by 0.5 m/s to improve ambulation in the home and community environment and to reduce fall risk.     Baseline  0.27 m/s with SPC    Time  8    Period  Weeks    Status  New    Target Date  04/30/18      PT LONG TERM GOAL #4   Title  Patient will  decrease 5 times sit-to-stand time to < 1 min without UE support in order to demonstrate increased LE strength and endurance.    Baseline  current: 64 sec with UE pushing on chair;     Time  8    Period  Weeks    Status  New     Target Date  04/30/18            Plan - 04/22/18 0913    Rehab Potential  Fair    Clinical Impairments Affecting Rehab Potential  (+) family support, motivation, no recent falls (-) obesity, co-morbidities    PT Frequency  2x / week    PT Duration  8 weeks    PT Treatment/Interventions  Cryotherapy;Moist Heat;DME Instruction;Gait training;Stair training;Functional mobility training;Therapeutic activities;Therapeutic exercise;Neuromuscular re-education;Balance training;Patient/family education    PT Next Visit Plan  outcome measures, update goals, progress note; continue LE strengthening and balance training     PT Home Exercise Plan  seated marching and LAQ, SLR, supine hip Abd    Consulted and Agree with Plan of Care  Patient       Patient will benefit from skilled therapeutic intervention in order to improve the following deficits and impairments:  Abnormal gait, Decreased activity tolerance, Decreased balance, Decreased coordination, Decreased endurance, Decreased mobility, Decreased strength, Difficulty walking, Pain  Visit Diagnosis: Other abnormalities of gait and mobility  Muscle weakness (generalized)  Unsteadiness on feet     Problem List Patient Active Problem List   Diagnosis Date Noted  . Cerebral infarction (HCC) 08/14/2016  . Acute CVA (cerebrovascular accident) (HCC) 08/14/2016  . Delirium due to another medical condition 08/13/2016  . Urinary tract infection 08/13/2016  . Right foot infection 05/11/2016  . Foot abscess, right 05/11/2016  . Anxiety and depression 12/02/2015  . CVA (cerebral infarction) 11/27/2015  . Epilepsy (HCC) 06/17/2015  . 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 Headache 05/19/2015  . Weakness of left side of body 05/19/2015  . History of stroke 05/19/2015  . Severe obesity (BMI >= 40) (HCC) 03/19/2014  . HTN (hypertension)   . Diabetes (HCC)   . Hyperlipidemia   . [name] (transient ischemic attack) 03/17/2014   Lynnea Maizes PT,  DPT, GCS  Jaylanni Eltringham 04/22/2018, 12:48 PM  [facility] MAIN [facility] SERVICES [address] [place], [place], [address] Phone: [phone]   Fax:  [phone]  Name: Milliani Herrada MRN: [id number] Date of Birth: 1966-08-29

## 2018-04-24 ENCOUNTER — Ambulatory Visit: Payer: BLUE CROSS/BLUE SHIELD

## 2018-04-24 VITALS — BP 154/96 | HR 83

## 2018-04-24 DIAGNOSIS — M6281 Muscle weakness (generalized): Secondary | ICD-10-CM

## 2018-04-24 DIAGNOSIS — R2681 Unsteadiness on feet: Secondary | ICD-10-CM

## 2018-04-24 DIAGNOSIS — R2689 Other abnormalities of gait and mobility: Secondary | ICD-10-CM | POA: Diagnosis not present

## 2018-04-24 NOTE — Therapy (Signed)
[place] MAIN [facility] SERVICES [address] [place], [place], [address] Phone: [phone]   Fax:  [phone]  Physical Therapy Treatment/Progress Note  Dates of reporting period 03/05/18    to   04/24/18  Patient Details  Name: [name] MRN: [id number] Date of Birth: 1966-01-25 Referring Provider: Dr. Melrose Nakayama   Encounter Date: 04/24/2018  PT End of Session - 04/24/18 0904    Visit Number  10    Number of Visits  17    Date for PT Re-Evaluation  04/30/18    Authorization Type  progress note 10/10    Authorization Time Period  medicaid auth 12 visits 04/08/18-05/19/18, goals last updated 03/05/18    Authorization - Visit Number  3    Authorization - Number of Visits  12    PT Start Time  0858    PT Stop Time  0940    PT Time Calculation (min)  42 min    Equipment Utilized During Treatment  Gait belt    Activity Tolerance  Patient tolerated treatment well;Patient limited by fatigue    Behavior During Therapy  [facility] for tasks assessed/performed       Past Medical History:  Diagnosis Date  . Allergic rhinitis   . Chickenpox   . Depression   . Diabetes ([place])   . Epilepsy ([place])   . Headache   . HTN (hypertension)   . Hyperlipidemia   . Seizures ([place])   . Stroke ([place])   . Urinary incontinence     Past Surgical History:  Procedure Laterality Date  . CESAREAN SECTION    . INCISION AND DRAINAGE Right 05/13/2016   Procedure: INCISION AND DRAINAGE;  Surgeon: Samara Deist, DPM;  Location: [facility] ORS;  Service: Podiatry;  Laterality: Right;    Vitals:   04/24/18 0902  BP: (!) 154/96  Pulse: 83  SpO2: 100%    Subjective Assessment - 04/24/18 0903    Subjective  Pt states she is doing well today. She denies pain currently. No specific questions or concerns at this time.     Pertinent History  Patient is a 52 y.o. female with history of strokes in 2017 and 2018 reporting left sided weakness and difficulty with ambulation. Pt is legally blind  and uses a cane for assistance with ambulation. PMH is significant for diabetes type 2, hyperlipidemia, hypertension, and obesity. Pt demonstrates LE > UE weakness on the left with general deconditioning and unsteadiness when standing.     Limitations  Standing;Walking;House hold activities    How long can you sit comfortably?  NA    How long can you stand comfortably?  Roughly 5 min    How long can you walk comfortably?  Few minutes    Diagnostic tests  MRI in 2017- left pontine infarct, infarct anterior margin of posterior limb of left internal capsule, right MCA infarct    Patient Stated Goals  Not having to use cane anymore    Currently in Pain?  No/denies         [facility] PT Assessment - 04/24/18 0914      Standardized Balance Assessment   Standardized Balance Assessment  Berg Balance Test;Five Times Sit to Stand;10 meter walk test    Five times sit to stand comments   37.75 seconds with BUE support, high fall risk    10 Meter Walk  self-selected: 24.0s=0.42 m/s, fastest: 18.8s=0.53 m/s;      [employer]   Sit to Stand  Able to stand using hands after several tries    Standing Unsupported  Able to stand 30 seconds unsupported    Sitting with Back Unsupported but Feet Supported on Floor or Stool  Able to sit safely and securely 2 minutes    Stand to Sit  Uses backs of legs against chair to control descent    Transfers  Able to transfer safely, definite need of hands    Standing Unsupported with Eyes Closed  Able to stand 10 seconds with supervision    Standing Ubsupported with Feet Together  Able to place feet together independently but unable to hold for 30 seconds    From Standing, Reach Forward with Outstretched Arm  Can reach forward >12 cm safely (5")    From Standing Position, Pick up Object from Floor  Able to pick up shoe, needs supervision    From Standing Position, Turn to Look Behind Over each Shoulder  Turn sideways only but maintains balance    Turn 360 Degrees  Able  to turn 360 degrees safely but slowly    Standing Unsupported, Alternately Place Feet on Step/Stool  Needs assistance to keep from falling or unable to try    Standing Unsupported, One Foot in Front  Needs help to step but can hold 15 seconds    Standing on One Leg  Tries to lift leg/unable to hold 3 seconds but remains standing independently    Total Score  30    Berg comment:  High fall risk, close to 100% chance of repeat fall        TREATMENT  Ther-ex  Outcome measures performed with patient: 8mgait speed: self-selected: 24.0s=0.42 m/s, fastest: 18.8s=0.53 m/s; BERG: 30/56 5TSTS: 37.75s with bilateral UE support on chair  Sit to stand without UE support from regular height chair with Airex pad on seat 2 x 10, min VCs to improve weight shifting and sequeing;  Standing with 2# ankle weights BLE: Hip flexion march x 15 with cues to increase ROM for better strengthening; Hip abduction x 15 with cues for proper form; Hip extension x 15 with cues for proper form;  Patient required min VCs for balance stability, including to increase trunk control for less loss of balance with smaller base of support                   PT Education - 04/24/18 0904    Education Details  exercise form/technique, outcome measures, goals    Person(s) Educated  Patient    Methods  Explanation    Comprehension  Verbalized understanding       PT Short Term Goals - 04/24/18 0905      PT SHORT TERM GOAL #1   Title  Patient will increase BLE gross strength to 4+/5 as to improve functional strength for independent gait, increased standing tolerance and increased ADL ability.    Baseline  grossly 3+ to 4- throughout LE    Time  4    Period  Weeks    Status  Deferred      PT SHORT TERM GOAL #2   Title  Pt will increase standing tolerance time to at least 5 min to improve ability to perform ADLs such as preparing a meal.     Baseline  only able to stand for approximately 1 min;  04/24/18: "maybe 5 minutes"    Time  4    Period  Weeks    Status  Achieved  PT Long Term Goals - 04/24/18 0906      PT LONG TERM GOAL #1   Title  Patient will be independent in home exercise program to improve strength/mobility for better functional independence with ADLs.    Baseline  does not have a HEP, 04/24/18: Pt reports independence with HEP    Time  8    Period  Weeks    Status  Achieved      PT LONG TERM GOAL #2   Title  Pt will improve Berg Balance Assessment score by 7 points to decrease fall risk in home and community environment.     Baseline  31/56; 04/24/18: 30/56    Time  8    Period  Weeks    Status  On-going    Target Date  04/30/18      PT LONG TERM GOAL #3   Title  Pt will increase gait speed by 0.5 m/s to improve ambulation in the home and community environment and to reduce fall risk.     Baseline  0.27 m/s with SPC; 04/24/18: self-selected: 24.0s=0.42 m/s, fastest: 18.8s=0.53 m/s;    Time  8    Period  Weeks    Status  Partially Met    Target Date  04/30/18      PT LONG TERM GOAL #4   Title  Patient will decrease 5 times sit-to-stand time to < 1 min without UE support in order to demonstrate increased LE strength and endurance.    Baseline  current: 64 sec with UE pushing on chair; 04/24/18: 37.75s with BUE support, still unable to perform without UE support    Time  8    Period  Weeks    Status  Partially Met    Target Date  04/30/18            Plan - 04/24/18 5573    Clinical Impression Statement  Performed outcome measures with patient on this date. She scored 30/56 on the BERG which is essentially unchanged since her initial evaluation. Her Five Time Sit to Stand improved to 37.75s today from 64s at initial evaluation however pt continues to require use of her bilateral UEs to stand. Ten meter gait speed has improved significantly since initial evaluation but is still not functional for community mobility. Pt encouraged to continue HEP and  follow-up as scheduled.     Rehab Potential  Fair    Clinical Impairments Affecting Rehab Potential  (+) family support, motivation, no recent falls (-) obesity, co-morbidities    PT Frequency  2x / week    PT Duration  8 weeks    PT Treatment/Interventions  Cryotherapy;Moist Heat;DME Instruction;Gait training;Stair training;Functional mobility training;Therapeutic activities;Therapeutic exercise;Neuromuscular re-education;Balance training;Patient/family education    PT Next Visit Plan  --    PT Home Exercise Plan  seated marching and LAQ, SLR, supine hip Abd    Consulted and Agree with Plan of Care  Patient       Patient will benefit from skilled therapeutic intervention in order to improve the following deficits and impairments:  Abnormal gait, Decreased activity tolerance, Decreased balance, Decreased coordination, Decreased endurance, Decreased mobility, Decreased strength, Difficulty walking, Pain  Visit Diagnosis: Muscle weakness (generalized)  Unsteadiness on feet     Problem List Patient Active Problem List   Diagnosis Date Noted  . Cerebral infarction ([place]) 08/14/2016  . Acute CVA (cerebrovascular accident) ([place]) 08/14/2016  . Delirium due to another medical condition 08/13/2016  . Urinary tract infection 08/13/2016  .  Right foot infection 05/11/2016  . Foot abscess, right 05/11/2016  . Anxiety and depression 12/02/2015  . CVA (cerebral infarction) 11/27/2015  . Epilepsy ([place]) 06/17/2015  . Complicated migraine 95/74/7340  . Headache 05/19/2015  . Weakness of left side of body 05/19/2015  . History of stroke 05/19/2015  . Severe obesity (BMI >= 40) ([place]) 03/19/2014  . HTN (hypertension)   . Diabetes ([place])   . Hyperlipidemia   . [name] (transient ischemic attack) 03/17/2014   Phillips Grout PT, DPT, GCS  Bryah Ocheltree 04/24/2018, 3:06 PM  [place] MAIN [facility] SERVICES [address] [place], [place], [address] Phone:  [phone]   Fax:  [phone]  Name: Mckinleigh Schuchart MRN: [id number] Date of Birth: 1965-12-20

## 2018-04-29 ENCOUNTER — Ambulatory Visit: Payer: BLUE CROSS/BLUE SHIELD | Admitting: Physical Therapy

## 2018-04-29 ENCOUNTER — Encounter: Payer: Self-pay | Admitting: Physical Therapy

## 2018-04-29 DIAGNOSIS — M6281 Muscle weakness (generalized): Secondary | ICD-10-CM

## 2018-04-29 DIAGNOSIS — R2689 Other abnormalities of gait and mobility: Secondary | ICD-10-CM

## 2018-04-29 DIAGNOSIS — R2681 Unsteadiness on feet: Secondary | ICD-10-CM

## 2018-04-29 NOTE — Therapy (Signed)
[place] MAIN [facility] SERVICES [address] [place], [place], [address] Phone: [phone]   Fax:  [phone]  Physical Therapy Treatment  Patient Details  Name: [name] MRN: [id number] Date of Birth: November 16, 1965 Referring Provider: Dr. Melrose Nakayama   Encounter Date: 04/29/2018  PT End of Session - 04/29/18 0914    Visit Number  11    Number of Visits  25    Date for PT Re-Evaluation  05/27/18    Authorization Type  progress note 1/10    Authorization Time Period  medicaid auth 12 visits 04/08/18-05/19/18, goals last updated 04/25/18    Authorization - Visit Number  4    Authorization - Number of Visits  12    PT Start Time  0902    PT Stop Time  0945    PT Time Calculation (min)  43 min    Equipment Utilized During Treatment  Gait belt    Activity Tolerance  Patient tolerated treatment well;Patient limited by fatigue    Behavior During Therapy  [facility] for tasks assessed/performed       Past Medical History:  Diagnosis Date  . Allergic rhinitis   . Chickenpox   . Depression   . Diabetes ([place])   . Epilepsy ([place])   . Headache   . HTN (hypertension)   . Hyperlipidemia   . Seizures ([place])   . Stroke ([place])   . Urinary incontinence     Past Surgical History:  Procedure Laterality Date  . CESAREAN SECTION    . INCISION AND DRAINAGE Right 05/13/2016   Procedure: INCISION AND DRAINAGE;  Surgeon: Samara Deist, DPM;  Location: [facility] ORS;  Service: Podiatry;  Laterality: Right;    There were no vitals filed for this visit.  Subjective Assessment - 04/29/18 0910    Subjective  Pt states she is doing well today. She denies pain currently. Reports going out of town this past weekend and had a good time;     Pertinent History  Patient is a 52 y.o. female with history of strokes in 2017 and 2018 reporting left sided weakness and difficulty with ambulation. Pt is legally blind and uses a cane for assistance with ambulation. PMH is significant for  diabetes type 2, hyperlipidemia, hypertension, and obesity. Pt demonstrates LE > UE weakness on the left with general deconditioning and unsteadiness when standing.     Limitations  Standing;Walking;House hold activities    How long can you sit comfortably?  NA    How long can you stand comfortably?  Roughly 5 min    How long can you walk comfortably?  Few minutes    Diagnostic tests  MRI in 2017- left pontine infarct, infarct anterior margin of posterior limb of left internal capsule, right MCA infarct    Patient Stated Goals  Not having to use cane anymore    Currently in Pain?  No/denies    Multiple Pain Sites  No          Treatment Warm up on Nustep BUE/BLE level 2 x4 min with cues to increase steps per minute to >50; patient had difficulty achieving 50 spm and required frequent cues to not slow down with increased repetition;   Standing outside parallel bars: Forward/backward walking with 1 rail assist x10 feet x5 laps with CGA to close supervision with cues to increase step length and improve erect posture for better gait techniques/safety;  Standing on airex: -Heel/toe raises x15 with cues to keep knee straight for  better ankle strengthening;  -alternate toe taps to 6 inch step x15 with 2-1 rail assist; -Alternate march x5 reps, sitting rest break x10 reps with 2 rail assist with cues to increase hip flexion for better strengthening; -mini squat unsupported x10 reps with cues for positioning for better quad control;    Patient requires several rest breaks due to fatigue.  Advanced HEP with instruction in standing exercise. Educated caregiver to increase adherence; Patient agreeable;                       PT Education - 04/29/18 0914    Education Details  exercise, strengthening/balance;     Person(s) Educated  Patient    Methods  Explanation;Verbal cues    Comprehension  Verbalized understanding;Returned demonstration;Verbal cues required;Need further  instruction       PT Short Term Goals - 04/24/18 0905      PT SHORT TERM GOAL #1   Title  Patient will increase BLE gross strength to 4+/5 as to improve functional strength for independent gait, increased standing tolerance and increased ADL ability.    Baseline  grossly 3+ to 4- throughout LE    Time  4    Period  Weeks    Status  Deferred      PT SHORT TERM GOAL #2   Title  Pt will increase standing tolerance time to at least 5 min to improve ability to perform ADLs such as preparing a meal.     Baseline  only able to stand for approximately 1 min; 04/24/18: "maybe 5 minutes"    Time  4    Period  Weeks    Status  Achieved        PT Long Term Goals - 04/29/18 2725      PT LONG TERM GOAL #1   Title  Patient will be independent in home exercise program to improve strength/mobility for better functional independence with ADLs.    Baseline  does not have a HEP, 04/24/18: Pt reports independence with HEP    Time  8    Period  Weeks    Status  Achieved      PT LONG TERM GOAL #2   Title  Pt will improve Berg Balance Assessment score by 7 points to decrease fall risk in home and community environment.     Baseline  31/56; 04/24/18: 30/56    Time  4    Period  Weeks    Status  On-going    Target Date  05/27/18      PT LONG TERM GOAL #3   Title  Pt will increase gait speed by 0.5 m/s to improve ambulation in the home and community environment and to reduce fall risk.     Baseline  0.27 m/s with SPC; 04/24/18: self-selected: 24.0s=0.42 m/s, fastest: 18.8s=0.53 m/s;    Time  4    Period  Weeks    Status  Partially Met    Target Date  05/27/18      PT LONG TERM GOAL #4   Title  Patient will decrease 5 times sit-to-stand time to < 1 min without UE support in order to demonstrate increased LE strength and endurance.    Baseline  current: 64 sec with UE pushing on chair; 04/24/18: 37.75s with BUE support, still unable to perform without UE support    Time  4    Period  Weeks    Status   Partially Met  Target Date  05/27/18            Plan - 04/29/18 2811    Clinical Impression Statement  Patient instructed in advanced LE strengthening exercise. She fatigues quickly requiring cues to increase ROM and increase mobility for better standing tolerance. Patient requires at least 1 rail assist for safety when standing on compliant surfaces. She required cues for better posture and weight shift with advanced balance tasks. Reinforced HEP. Patient has made some progress towards goals, refer to last progress note on 04/25/18; Patient would benefit from additional skilled PT intervention to improve strength, balance and gait safety;     Rehab Potential  Fair    Clinical Impairments Affecting Rehab Potential  (+) family support, motivation, no recent falls (-) obesity, co-morbidities    PT Frequency  2x / week    PT Duration  4 weeks    PT Treatment/Interventions  Cryotherapy;Moist Heat;DME Instruction;Gait training;Stair training;Functional mobility training;Therapeutic activities;Therapeutic exercise;Neuromuscular re-education;Balance training;Patient/family education    PT Home Exercise Plan  seated marching and LAQ, SLR, supine hip Abd    Consulted and Agree with Plan of Care  Patient       Patient will benefit from skilled therapeutic intervention in order to improve the following deficits and impairments:  Abnormal gait, Decreased activity tolerance, Decreased balance, Decreased coordination, Decreased endurance, Decreased mobility, Decreased strength, Difficulty walking, Pain  Visit Diagnosis: Muscle weakness (generalized)  Unsteadiness on feet  Other abnormalities of gait and mobility     Problem List Patient Active Problem List   Diagnosis Date Noted  . Cerebral infarction ([place]) 08/14/2016  . Acute CVA (cerebrovascular accident) ([place]) 08/14/2016  . Delirium due to another medical condition 08/13/2016  . Urinary tract infection 08/13/2016  . Right foot infection  05/11/2016  . Foot abscess, right 05/11/2016  . Anxiety and depression 12/02/2015  . CVA (cerebral infarction) 11/27/2015  . Epilepsy ([place]) 06/17/2015  . Complicated migraine 88/67/7373  . Headache 05/19/2015  . Weakness of left side of body 05/19/2015  . History of stroke 05/19/2015  . Severe obesity (BMI >= 40) ([place]) 03/19/2014  . HTN (hypertension)   . Diabetes ([place])   . Hyperlipidemia   . [name] (transient ischemic attack) 03/17/2014    Quinlin Conant PT, DPT 04/29/2018, 10:11 AM  [place] MAIN [facility] SERVICES [place], [place], [address] Phone: [phone]   Fax:  [phone]  Name: [name] MRN: [id number] Date of Birth: Apr 11, 1966

## 2018-04-29 NOTE — Patient Instructions (Signed)
Access Code: [email or web address]  URL: [email or web address]  Date: 04/29/2018  Prepared by: Zettie PhoMargaret Jag Lenz   Exercises  Heel Raises with Counter Support - 15 reps - 2 sets - 1x daily - 7x weekly  Standing March with Counter Support - 15 reps - 2 sets - 1x daily - 7x weekly  Standing Hip Abduction with Counter Support - 15 reps - 2 sets - 1x daily - 7x weekly  Standing Partial Squat - 15 reps - 2 sets - 1x daily - 7x weekly

## 2018-05-06 ENCOUNTER — Encounter: Payer: Self-pay | Admitting: Physical Therapy

## 2018-05-06 ENCOUNTER — Ambulatory Visit: Payer: BLUE CROSS/BLUE SHIELD | Admitting: Physical Therapy

## 2018-05-06 DIAGNOSIS — M6281 Muscle weakness (generalized): Secondary | ICD-10-CM

## 2018-05-06 DIAGNOSIS — R2689 Other abnormalities of gait and mobility: Secondary | ICD-10-CM | POA: Diagnosis not present

## 2018-05-06 DIAGNOSIS — R2681 Unsteadiness on feet: Secondary | ICD-10-CM

## 2018-05-06 NOTE — Therapy (Signed)
[place] MAIN [facility] SERVICES [address] [place], [place], [address] Phone: [phone]   Fax:  [phone]  Physical Therapy Treatment  Patient Details  Name: [name] MRN: [id number] Date of Birth: 08-28-1966 Referring Provider: Dr. Melrose Nakayama   Encounter Date: 05/06/2018  PT End of Session - 05/06/18 0844    Visit Number  12    Number of Visits  25    Date for PT Re-Evaluation  05/27/18    Authorization Type  progress note 2/10    Authorization Time Period  medicaid auth 12 visits 04/08/18-05/19/18, goals last updated 04/25/18    Authorization - Visit Number  5    Authorization - Number of Visits  12    PT Start Time  0846    PT Stop Time  0929    PT Time Calculation (min)  43 min    Equipment Utilized During Treatment  Gait belt    Activity Tolerance  Patient tolerated treatment well;Patient limited by fatigue    Behavior During Therapy  [facility] for tasks assessed/performed       Past Medical History:  Diagnosis Date  . Allergic rhinitis   . Chickenpox   . Depression   . Diabetes ([place])   . Epilepsy ([place])   . Headache   . HTN (hypertension)   . Hyperlipidemia   . Seizures ([place])   . Stroke ([place])   . Urinary incontinence     Past Surgical History:  Procedure Laterality Date  . CESAREAN SECTION    . INCISION AND DRAINAGE Right 05/13/2016   Procedure: INCISION AND DRAINAGE;  Surgeon: Samara Deist, DPM;  Location: [facility] ORS;  Service: Podiatry;  Laterality: Right;    There were no vitals filed for this visit.  Subjective Assessment - 05/06/18 0849    Subjective  Pt denies any recent falls.  She has been completing her HEP 3x/day, 7 days/wk without questions or concerns.      Pertinent History  Patient is a 52 y.o. female with history of strokes in 2017 and 2018 reporting left sided weakness and difficulty with ambulation. Pt is legally blind and uses a cane for assistance with ambulation. PMH is significant for diabetes type 2,  hyperlipidemia, hypertension, and obesity. Pt demonstrates LE > UE weakness on the left with general deconditioning and unsteadiness when standing.     Limitations  Standing;Walking;House hold activities    How long can you sit comfortably?  NA    How long can you stand comfortably?  Roughly 5 min    How long can you walk comfortably?  Few minutes    Diagnostic tests  MRI in 2017- left pontine infarct, infarct anterior margin of posterior limb of left internal capsule, right MCA infarct    Patient Stated Goals  Not having to use cane anymore    Currently in Pain?  No/denies         TREATMENT  Forward and backward stepping over hurdle with 1UE support only  Sideways stepping over hurdle with 1UE support only  Forward/backward walking in // bars with 1 rail assist x10 feet x7 laps with CGA to close supervision with cues to increase step length and improve erect posture for better gait techniques/safety.  Marching in standing on airex pad with 1UE support  Mini squat unsupported 2x10 reps with cues for positioning for better quad control?  Sideways walking on airex pad x2 laps in // bars with 1UE support  Step ups to 4" step  x20 with BUE support  Side step ups to 4" step x10 each direction with BUE support                         PT Education - 05/06/18 0844    Education Details  Exercise technique    Person(s) Educated  Patient    Methods  Explanation;Demonstration;Verbal cues    Comprehension  Verbalized understanding;Returned demonstration;Verbal cues required;Need further instruction       PT Short Term Goals - 04/24/18 0905      PT SHORT TERM GOAL #1   Title  Patient will increase BLE gross strength to 4+/5 as to improve functional strength for independent gait, increased standing tolerance and increased ADL ability.    Baseline  grossly 3+ to 4- throughout LE    Time  4    Period  Weeks    Status  Deferred      PT SHORT TERM GOAL #2   Title  Pt will  increase standing tolerance time to at least 5 min to improve ability to perform ADLs such as preparing a meal.     Baseline  only able to stand for approximately 1 min; 04/24/18: "maybe 5 minutes"    Time  4    Period  Weeks    Status  Achieved        PT Long Term Goals - 04/29/18 0623      PT LONG TERM GOAL #1   Title  Patient will be independent in home exercise program to improve strength/mobility for better functional independence with ADLs.    Baseline  does not have a HEP, 04/24/18: Pt reports independence with HEP    Time  8    Period  Weeks    Status  Achieved      PT LONG TERM GOAL #2   Title  Pt will improve Berg Balance Assessment score by 7 points to decrease fall risk in home and community environment.     Baseline  31/56; 04/24/18: 30/56    Time  4    Period  Weeks    Status  On-going    Target Date  05/27/18      PT LONG TERM GOAL #3   Title  Pt will increase gait speed by 0.5 m/s to improve ambulation in the home and community environment and to reduce fall risk.     Baseline  0.27 m/s with SPC; 04/24/18: self-selected: 24.0s=0.42 m/s, fastest: 18.8s=0.53 m/s;    Time  4    Period  Weeks    Status  Partially Met    Target Date  05/27/18      PT LONG TERM GOAL #4   Title  Patient will decrease 5 times sit-to-stand time to < 1 min without UE support in order to demonstrate increased LE strength and endurance.    Baseline  current: 64 sec with UE pushing on chair; 04/24/18: 37.75s with BUE support, still unable to perform without UE support    Time  4    Period  Weeks    Status  Partially Met    Target Date  05/27/18            Plan - 05/06/18 0850    Clinical Impression Statement  Pt demonstrates poor motor planning with side stepping activity over hurdle with cues for proper foot positioning.  Pt requires frequent cues to decrease UE dependence during standing exercises.  Pt's endurance remains  limited and pt frequently asking for a rest break; however, pt was  able to ambulate 7 laps in // bars compared to 5 last session, with max encouragement.  Pt will benefit from continued skilled PT interventions for decreased risk of falling and improved endurance and strength.      Rehab Potential  Fair    Clinical Impairments Affecting Rehab Potential  (+) family support, motivation, no recent falls (-) obesity, co-morbidities    PT Frequency  2x / week    PT Duration  4 weeks    PT Treatment/Interventions  Cryotherapy;Moist Heat;DME Instruction;Gait training;Stair training;Functional mobility training;Therapeutic activities;Therapeutic exercise;Neuromuscular re-education;Balance training;Patient/family education    PT Home Exercise Plan  seated marching and LAQ, SLR, supine hip Abd    Consulted and Agree with Plan of Care  Patient       Patient will benefit from skilled therapeutic intervention in order to improve the following deficits and impairments:  Abnormal gait, Decreased activity tolerance, Decreased balance, Decreased coordination, Decreased endurance, Decreased mobility, Decreased strength, Difficulty walking, Pain  Visit Diagnosis: Muscle weakness (generalized)  Unsteadiness on feet  Other abnormalities of gait and mobility     Problem List Patient Active Problem List   Diagnosis Date Noted  . Cerebral infarction ([place]) 08/14/2016  . Acute CVA (cerebrovascular accident) ([place]) 08/14/2016  . Delirium due to another medical condition 08/13/2016  . Urinary tract infection 08/13/2016  . Right foot infection 05/11/2016  . Foot abscess, right 05/11/2016  . Anxiety and depression 12/02/2015  . CVA (cerebral infarction) 11/27/2015  . Epilepsy ([place]) 06/17/2015  . Complicated migraine 73/56/7014  . Headache 05/19/2015  . Weakness of left side of body 05/19/2015  . History of stroke 05/19/2015  . Severe obesity (BMI >= 40) ([place]) 03/19/2014  . HTN (hypertension)   . Diabetes ([place])   . Hyperlipidemia   . [name] (transient ischemic attack)  03/17/2014    Collie Siad PT, DPT 05/06/2018, 9:27 AM  [place] MAIN [facility] SERVICES [address] [place], [place], [address] Phone: [phone]   Fax:  [phone]  Name: [name] MRN: [id number] Date of Birth: 1966/07/24

## 2018-05-08 ENCOUNTER — Ambulatory Visit: Payer: BLUE CROSS/BLUE SHIELD | Admitting: Physical Therapy

## 2018-05-08 ENCOUNTER — Encounter: Payer: Self-pay | Admitting: Physical Therapy

## 2018-05-08 DIAGNOSIS — R2681 Unsteadiness on feet: Secondary | ICD-10-CM

## 2018-05-08 DIAGNOSIS — R2689 Other abnormalities of gait and mobility: Secondary | ICD-10-CM | POA: Diagnosis not present

## 2018-05-08 DIAGNOSIS — M6281 Muscle weakness (generalized): Secondary | ICD-10-CM

## 2018-05-08 NOTE — Therapy (Signed)
[place] MAIN [facility] SERVICES [address] [place], [place], [address] Phone: [phone]   Fax:  [phone]  Physical Therapy Treatment  Patient Details  Name: [name] MRN: [id number] Date of Birth: [birth year] Referring Provider: Dr. Melrose Nakayama   Encounter Date: 05/08/2018  PT End of Session - 05/08/18 0844    Visit Number  13    Number of Visits  25    Date for PT Re-Evaluation  05/27/18    Authorization Type  progress note 3/10    Authorization Time Period  medicaid auth 12 visits 04/08/18-05/19/18, goals last updated 04/25/18    Authorization - Visit Number  6    Authorization - Number of Visits  12    PT Start Time  0846    PT Stop Time  0929    PT Time Calculation (min)  43 min    Equipment Utilized During Treatment  Gait belt    Activity Tolerance  Patient tolerated treatment well;Patient limited by fatigue    Behavior During Therapy  [facility] for tasks assessed/performed       Past Medical History:  Diagnosis Date  . Allergic rhinitis   . Chickenpox   . Depression   . Diabetes ([place])   . Epilepsy ([place])   . Headache   . HTN (hypertension)   . Hyperlipidemia   . Seizures ([place])   . Stroke ([place])   . Urinary incontinence     Past Surgical History:  Procedure Laterality Date  . CESAREAN SECTION    . INCISION AND DRAINAGE Right 05/13/2016   Procedure: INCISION AND DRAINAGE;  Surgeon: Samara Deist, DPM;  Location: [facility] ORS;  Service: Podiatry;  Laterality: Right;    There were no vitals filed for this visit.  Subjective Assessment - 05/08/18 0851    Subjective  Pt reports she is doing well this date.  She says she has been completing her HEP 3x/day but when asked which exercises she is doing she says marching and walking.  Pt says she can't remember her other exercises and that she lost her handout.  Pt denies pain at start of session but after performing side stepping in // bars the pt report 9/10 upper back pain which she says  began this past weekend one day when she woke up and is irritated with prolonged standing and bending over.  Pain down to 0/10 when seated or lying down.      Pertinent History  Patient is a 52 y.o. female with history of strokes in 2017 and 2018 reporting left sided weakness and difficulty with ambulation. Pt is legally blind and uses a cane for assistance with ambulation. PMH is significant for diabetes type 2, hyperlipidemia, hypertension, and obesity. Pt demonstrates LE > UE weakness on the left with general deconditioning and unsteadiness when standing.     Limitations  Standing;Walking;House hold activities    How long can you sit comfortably?  NA    How long can you stand comfortably?  Roughly 5 min    How long can you walk comfortably?  Few minutes    Diagnostic tests  MRI in 2017- left pontine infarct, infarct anterior margin of posterior limb of left internal capsule, right MCA infarct    Patient Stated Goals  Not having to use cane anymore    Currently in Pain?  No/denies    Pain Score  9     Pain Location  Back    Pain Orientation  Upper  Pain Descriptors / Indicators  Aching    Pain Type  Acute pain    Pain Onset  In the past 7 days   pt reports she woke up last weekend with back pain   Pain Frequency  Intermittent    Aggravating Factors   bending over, standing too long    Pain Relieving Factors  sitting, laying down    Multiple Pain Sites  No        TREATMENT   Forward walking in // bars with 1 rail assist x9 laps with CGA to close supervision with cues to increase step length and improve erect posture for better gait techniques/safety.   Marching in standing on airex pad with 1UE support x10 each LE   Side stepping in // bars with balloon toss at each end x6 lengths in // bars. Limited reps by upper back pain with pt reporting pain 9/10.   Standing hamstring curls with BUE x20 each LE   Mini squat unsupported 2x10 reps with cues for positioning for better quad  control?   Bil heel raises x15 with BUE support with very poor foot clearance. Pt requires max verbal cues to not bend knees.   Step ups to 4" step x20 with BUE support   Backward walking in // bars with 1 rail assist x3 laps with supervision for safety.   Forward and backward stepping over hurdle with 1UE support initially with pt transitioning to BUE support due to fatigue despite encouragement to maintain 1UE support only x10                         PT Education - 05/08/18 0843    Education Details  Exercise technique; importance of completing HEP in full; clinical reasoning behind decreasing rest breaks for improved endurance    Person(s) Educated  Patient    Methods  Explanation;Demonstration;Verbal cues;Handout    Comprehension  Verbalized understanding;Returned demonstration;Verbal cues required;Need further instruction       PT Short Term Goals - 04/24/18 0905      PT SHORT TERM GOAL #1   Title  Patient will increase BLE gross strength to 4+/5 as to improve functional strength for independent gait, increased standing tolerance and increased ADL ability.    Baseline  grossly 3+ to 4- throughout LE    Time  4    Period  Weeks    Status  Deferred      PT SHORT TERM GOAL #2   Title  Pt will increase standing tolerance time to at least 5 min to improve ability to perform ADLs such as preparing a meal.     Baseline  only able to stand for approximately 1 min; 04/24/18: "maybe 5 minutes"    Time  4    Period  Weeks    Status  Achieved        PT Long Term Goals - 04/29/18 2248      PT LONG TERM GOAL #1   Title  Patient will be independent in home exercise program to improve strength/mobility for better functional independence with ADLs.    Baseline  does not have a HEP, 04/24/18: Pt reports independence with HEP    Time  8    Period  Weeks    Status  Achieved      PT LONG TERM GOAL #2   Title  Pt will improve Berg Balance Assessment score by 7 points  to decrease fall risk in  home and community environment.     Baseline  31/56; 04/24/18: 30/56    Time  4    Period  Weeks    Status  On-going    Target Date  05/27/18      PT LONG TERM GOAL #3   Title  Pt will increase gait speed by 0.5 m/s to improve ambulation in the home and community environment and to reduce fall risk.     Baseline  0.27 m/s with SPC; 04/24/18: self-selected: 24.0s=0.42 m/s, fastest: 18.8s=0.53 m/s;    Time  4    Period  Weeks    Status  Partially Met    Target Date  05/27/18      PT LONG TERM GOAL #4   Title  Patient will decrease 5 times sit-to-stand time to < 1 min without UE support in order to demonstrate increased LE strength and endurance.    Baseline  current: 64 sec with UE pushing on chair; 04/24/18: 37.75s with BUE support, still unable to perform without UE support    Time  4    Period  Weeks    Status  Partially Met    Target Date  05/27/18            Plan - 05/08/18 0915    Clinical Impression Statement  Pt continues to demonstrate progress with ambulatory endurance.  HEP was re-printed for the pt and emphasis placed on completing HEP in full.  Limited pt to taking only 3 seated rest breaks this session in an effort to improve endurance.  Pt continues to require max encouragement to complete each exercise in full instead of stopping early due to fatigue.  Pt requires max verbal cues for correct form and technique throughout each exercise and often does not follow instruction from this therapist.  Pt will benefit from continued skilled PT interventions for improved endurance, strength, and functional independence.      Rehab Potential  Fair    Clinical Impairments Affecting Rehab Potential  (+) family support, motivation, no recent falls (-) obesity, co-morbidities    PT Frequency  2x / week    PT Duration  4 weeks    PT Treatment/Interventions  Cryotherapy;Moist Heat;DME Instruction;Gait training;Stair training;Functional mobility training;Therapeutic  activities;Therapeutic exercise;Neuromuscular re-education;Balance training;Patient/family education    PT Home Exercise Plan  seated marching and LAQ, SLR, supine hip Abd    Consulted and Agree with Plan of Care  Patient       Patient will benefit from skilled therapeutic intervention in order to improve the following deficits and impairments:  Abnormal gait, Decreased activity tolerance, Decreased balance, Decreased coordination, Decreased endurance, Decreased mobility, Decreased strength, Difficulty walking, Pain  Visit Diagnosis: Muscle weakness (generalized)  Unsteadiness on feet  Other abnormalities of gait and mobility     Problem List Patient Active Problem List   Diagnosis Date Noted  . Cerebral infarction ([place]) 08/14/2016  . Acute CVA (cerebrovascular accident) ([place]) 08/14/2016  . Delirium due to another medical condition 08/13/2016  . Urinary tract infection 08/13/2016  . Right foot infection 05/11/2016  . Foot abscess, right 05/11/2016  . Anxiety and depression 12/02/2015  . CVA (cerebral infarction) 11/27/2015  . Epilepsy ([place]) 06/17/2015  . Complicated migraine 56/21/3086  . Headache 05/19/2015  . Weakness of left side of body 05/19/2015  . History of stroke 05/19/2015  . Severe obesity (BMI >= 40) ([place]) 03/19/2014  . HTN (hypertension)   . Diabetes ([place])   . Hyperlipidemia   . [name] (  transient ischemic attack) 03/17/2014    Collie Siad PT, DPT 05/08/2018, 9:50 AM  [place] MAIN [facility] SERVICES [address] [place], [place], [address] Phone: [phone]   Fax:  [phone]  Name: Alvita Fana MRN: [id number] Date of Birth: 1966/07/14

## 2018-05-08 NOTE — Patient Instructions (Signed)
Access Code: [email or web address]  URL: [email or web address]  Date: 04/29/2018  Prepared by: Encarnacion ChuAshley Abashian   Exercises  Heel Raises with Counter Support - 15 reps - 2 sets - 1x daily - 7x weekly  Standing March with Counter Support - 15 reps - 2 sets - 1x daily - 7x weekly  Standing Hip Abduction with Counter Support - 15 reps - 2 sets - 1x daily - 7x weekly  Standing Partial Squat - 15 reps - 2 sets - 1x daily - 7x weekly

## 2018-05-13 ENCOUNTER — Ambulatory Visit: Payer: BLUE CROSS/BLUE SHIELD | Admitting: Physical Therapy

## 2018-05-15 ENCOUNTER — Ambulatory Visit: Payer: BLUE CROSS/BLUE SHIELD

## 2018-05-20 ENCOUNTER — Ambulatory Visit: Payer: BLUE CROSS/BLUE SHIELD | Admitting: Physical Therapy

## 2018-05-20 ENCOUNTER — Ambulatory Visit: Payer: BLUE CROSS/BLUE SHIELD | Attending: Neurology | Admitting: Physical Therapy

## 2018-05-20 DIAGNOSIS — R2689 Other abnormalities of gait and mobility: Secondary | ICD-10-CM | POA: Insufficient documentation

## 2018-05-20 DIAGNOSIS — R2681 Unsteadiness on feet: Secondary | ICD-10-CM | POA: Insufficient documentation

## 2018-05-20 DIAGNOSIS — M6281 Muscle weakness (generalized): Secondary | ICD-10-CM | POA: Insufficient documentation

## 2018-05-22 ENCOUNTER — Ambulatory Visit: Payer: BLUE CROSS/BLUE SHIELD | Admitting: Physical Therapy

## 2018-05-27 ENCOUNTER — Ambulatory Visit: Payer: BLUE CROSS/BLUE SHIELD | Admitting: Physical Therapy

## 2018-05-29 ENCOUNTER — Ambulatory Visit: Payer: BLUE CROSS/BLUE SHIELD | Admitting: Physical Therapy

## 2018-06-03 ENCOUNTER — Ambulatory Visit: Payer: BLUE CROSS/BLUE SHIELD | Admitting: Physical Therapy

## 2018-06-05 ENCOUNTER — Encounter: Payer: Self-pay | Admitting: Physical Therapy

## 2018-06-05 ENCOUNTER — Ambulatory Visit: Payer: BLUE CROSS/BLUE SHIELD | Admitting: Physical Therapy

## 2018-06-05 DIAGNOSIS — M6281 Muscle weakness (generalized): Secondary | ICD-10-CM

## 2018-06-05 DIAGNOSIS — R2689 Other abnormalities of gait and mobility: Secondary | ICD-10-CM

## 2018-06-05 DIAGNOSIS — R2681 Unsteadiness on feet: Secondary | ICD-10-CM

## 2018-06-05 NOTE — Therapy (Signed)
[place] MAIN [facility] SERVICES [address] [place], [place], [address] Phone: [phone]   Fax:  [phone]  June 05, 2018     Physical Therapy Discharge Summary  Patient: [name]  MRN: [id number]  Date of Birth: 1966-02-15   Diagnosis: Muscle weakness (generalized)  Unsteadiness on feet  Other abnormalities of gait and mobility Referring Provider: Dr. Melrose Nakayama   The above patient had 5 no shows and thus has been notified that she will unfortunately be discharged from therapy at this time.      PT Long Term Goals - 04/29/18 5732      PT LONG TERM GOAL #1   Title  Patient will be independent in home exercise program to improve strength/mobility for better functional independence with ADLs.    Baseline  does not have a HEP, 04/24/18: Pt reports independence with HEP    Time  8    Period  Weeks    Status  Achieved      PT LONG TERM GOAL #2   Title  Pt will improve Berg Balance Assessment score by 7 points to decrease fall risk in home and community environment.     Baseline  31/56; 04/24/18: 30/56    Time  4    Period  Weeks    Status  On-going    Target Date  05/27/18      PT LONG TERM GOAL #3   Title  Pt will increase gait speed by 0.5 m/s to improve ambulation in the home and community environment and to reduce fall risk.     Baseline  0.27 m/s with SPC; 04/24/18: self-selected: 24.0s=0.42 m/s, fastest: 18.8s=0.53 m/s;    Time  4    Period  Weeks    Status  Partially Met    Target Date  05/27/18      PT LONG TERM GOAL #4   Title  Patient will decrease 5 times sit-to-stand time to < 1 min without UE support in order to demonstrate increased LE strength and endurance.    Baseline  current: 64 sec with UE pushing on chair; 04/24/18: 37.75s with BUE support, still unable to perform without UE support    Time  4    Period  Weeks    Status  Partially Met    Target Date  05/27/18        Sincerely,  Collie Siad PT,  DPT [place] Si[place], [place] MAIN [facility] SERVICES [address] [place], [place], [address] Phone: [phone]   Fax:  [phone]  Patient: [name]  MRN: [id number]  Date of Birth: Jan 31, 1966

## 2018-06-10 ENCOUNTER — Other Ambulatory Visit: Payer: Self-pay

## 2018-06-10 ENCOUNTER — Ambulatory Visit: Payer: BLUE CROSS/BLUE SHIELD | Admitting: Physical Therapy

## 2018-06-10 ENCOUNTER — Encounter: Payer: Self-pay | Admitting: Physical Therapy

## 2018-06-10 DIAGNOSIS — M6281 Muscle weakness (generalized): Secondary | ICD-10-CM | POA: Diagnosis not present

## 2018-06-10 DIAGNOSIS — R2681 Unsteadiness on feet: Secondary | ICD-10-CM

## 2018-06-10 DIAGNOSIS — R2689 Other abnormalities of gait and mobility: Secondary | ICD-10-CM

## 2018-06-10 NOTE — Patient Instructions (Signed)
Access Code: [email or web address]  URL: [email or web address]  Date: 06/10/2018  Prepared by: Encarnacion ChuAshley Nakesha Ebrahim   Exercises  Seated March - 15 reps - 2 sets - 2x daily - 7x weekly  Sit to Stand with Armchair - 10 reps - 2 sets - 2x daily - 7x weekly  Standing March with Counter Support - 15 reps - 2 sets - 2x daily - 7x weekly  Seated Long Arc Quad - 10 reps - 2 sets - 2x daily - 7x weekly  Mini Squat with Counter Support - 10 reps - 2 sets - 2x daily - 7x weekly

## 2018-06-10 NOTE — Therapy (Signed)
[facility] [facility] MAIN [facility] SERVICES [address] [place], [place], [address] Phone: [phone]   Fax:  [phone]  Physical Therapy Evaluation  Patient Details  Name: [name] MRN: [id number] Date of Birth: 23-Jun-1966 Referring Provider: Dr. Malvin Johns   Encounter Date: 06/10/2018  PT End of Session - 06/10/18 1005    Visit Number  1    Number of Visits  13    Date for PT Re-Evaluation  08/05/18    Authorization Type  MEDICAID    PT Start Time  0846    PT Stop Time  0926    PT Time Calculation (min)  40 min    Equipment Utilized During Treatment  Gait belt    Activity Tolerance  Patient limited by fatigue    Behavior During Therapy  [facility] for tasks assessed/performed       Past Medical History:  Diagnosis Date  . Allergic rhinitis   . Chickenpox   . Depression   . Diabetes (HCC)   . Epilepsy (HCC)   . Headache   . HTN (hypertension)   . Hyperlipidemia   . Seizures (HCC)   . Stroke (HCC)   . Urinary incontinence     Past Surgical History:  Procedure Laterality Date  . CESAREAN SECTION    . INCISION AND DRAINAGE Right 05/13/2016   Procedure: INCISION AND DRAINAGE;  Surgeon: Gwyneth Revels, DPM;  Location: [facility] ORS;  Service: Podiatry;  Laterality: Right;    There were no vitals filed for this visit.   Subjective Assessment - 06/10/18 1231    Subjective  "I want to be able to walk without the cane"    Patient is accompained by:  Family member   husband in waiting room   Pertinent History  Pt is well known to this clinic and has returned to therapy due to weakness.  Patient is a 52 y.o. female with history of strokes in 2017 and 2018 reporting left sided weakness and difficulty with ambulation. Pt is legally blind and uses a cane at all times for assistance with ambulation. PMH is significant for diabetes type 2, hyperlipidemia, hypertension, and obesity. Pt demonstrates LE > UE weakness on the left with general deconditioning and  unsteadiness when standing.  Goals: to walk without the cane, to be able to cook independently (be able to stand up long enough to do so).  Pt reports limited ambulatory endurance, with the ability to walk up to 10 minutes.  Family does the driving and running errands so the pt mainly remains in her home.  Pt denies any pain.  Pt reports she has been completing sit<>stand and walking as part of her HEP since she was last seen in this clinic.     Limitations  Standing;Walking;House hold activities    How long can you walk comfortably?  10 minutes    Patient Stated Goals  see above    Currently in Pain?  No/denies    Multiple Pain Sites  No         [facility] PT Assessment - 06/10/18 0852      Assessment   Medical Diagnosis  CVA/left sided weakness    Referring Provider  Dr. Malvin Johns    Onset Date/Surgical Date  04/09/16   First stroke in July, 2017 per pt   Hand Dominance  Right    Next MD Visit  9/26 with PCP for annual physical    Prior Therapy  Yes, with this clinic  Precautions   Precautions  Fall      Restrictions   Weight Bearing Restrictions  No      Balance Screen   Has the patient fallen in the past 6 months  No    Has the patient had a decrease in activity level because of a fear of falling?   Yes    Is the patient reluctant to leave their home because of a fear of falling?   Yes      Home Environment   Living Environment  Private residence    Living Arrangements  Spouse/significant other;Children    Available Help at Discharge  Family    Type of Home  House    Home Access  Stairs to enter    Entrance Stairs-Number of Steps  6 stairs in the front, 2 stairs in back     Entrance Stairs-Rails  None   pt reports no railings at front or back steps   Home Layout  Two level;Able to live on main level with bedroom/bathroom    Home Equipment  Cane - single point;Walker - 2 wheels      Prior Function   Level of Independence  Independent with basic ADLs;Independent with  household mobility with device    Leisure  reading, dancing, watch TV      Cognition   Overall Cognitive Status  Difficult to assess    Area of Impairment  Safety/judgement;Problem solving    Difficult to assess due to  --   Pt reserved and not forthcoming with responses   Safety/Judgement  Decreased awareness of deficits    Problem Solving  Slow processing;Decreased initiation;Requires verbal cues;Requires tactile cues      Observation/Other Assessments   Observations  Forward head, rounded shoulders      Coordination   Gross Motor Movements are Fluid and Coordinated  Yes      Standardized Balance Assessment   Standardized Balance Assessment  Berg Balance Test      Berg Balance Test   Sit to Stand  Able to stand  independently using hands    Standing Unsupported  Able to stand safely 2 minutes    Sitting with Back Unsupported but Feet Supported on Floor or Stool  Able to sit safely and securely 2 minutes    Stand to Sit  Controls descent by using hands    Transfers  Able to transfer safely, definite need of hands    Standing Unsupported with Eyes Closed  Able to stand 10 seconds safely    Standing Ubsupported with Feet Together  Able to place feet together independently but unable to hold for 30 seconds    From Standing, Reach Forward with Outstretched Arm  Can reach forward >5 cm safely (2")    From Standing Position, Pick up Object from Floor  Able to pick up shoe, needs supervision    From Standing Position, Turn to Look Behind Over each Shoulder  Needs supervision when turning    Turn 360 Degrees  Able to turn 360 degrees safely but slowly    Standing Unsupported, Alternately Place Feet on Step/Stool  Needs assistance to keep from falling or unable to try    Standing Unsupported, One Foot in [employer] balance while stepping or standing    Standing on One Leg  Tries to lift leg/unable to hold 3 seconds but remains standing independently    Total Score  32    Berg comment:   High fall risk  EXAMINATION   BLE strength: RLE strength grossly 4-/5 except hamstring 5/5 and hip F 2+/5, LLE strength grossly 3-/5 except hamstring 4+/5 and hip F 2+/5.   Berg Balance Test: 32/56   10mWT with SPC: 0.80 m/s   5xSTS: 23. 06 sec   TUG without SPC: 20.09 sec   Gait assessment: Dec step length Bil and pt shuffling her feet Bil, dec arm swing and trunk rotation Bil, dec stance time LLE, pt intermittently using SPC        TREATMENT   Marching in sitting x15 each LE (added to HEP)   Mini squat x10 with UE support (added to HEP)   Standing march with UE support x15 each LE (added to HEP)   LAQ x10 each LE (added to HEP)           Objective measurements completed on examination: See above findings.              PT Education - 06/10/18 1004    Education Details  POC, role of PT, importance of adherence to HEP, HEP provided and reviewed    Person(s) Educated  Patient    Methods  Explanation;Demonstration;Handout    Comprehension  Verbalized understanding;Returned demonstration;Verbal cues required;Need further instruction       PT Short Term Goals - 06/10/18 1256      PT SHORT TERM GOAL #1   Title  Pt will independently complete HEP at least 4 days/wk for carryover between sessions    Time  2    Period  Weeks    Status  New        PT Long Term Goals - 06/10/18 1258      PT LONG TERM GOAL #1   Title  Pt's Berg Balance Test will improve to at least 45/56 to demonstrate improved balance    Baseline  32/56    Time  6    Period  Weeks    Status  New      PT LONG TERM GOAL #2   Title  Pt will be able to stand for at least 10 minutes to allow her to participate in cooking at home    Time  6    Period  Weeks    Status  New      PT LONG TERM GOAL #3   Title  Pt's TUG time will improve to at least equal or less than 13 seconds to demonstrate improved gait speed and safety with turns    Baseline  20.09 sec    Time  6     Period  Weeks    Status  New      PT LONG TERM GOAL #4   Title  Pt's 10mWT will improve to at least 1.0 m/s without cane for improved gait speed    Baseline  0.80 m/s with SPC    Time  4    Period  Weeks    Status  New      PT LONG TERM GOAL #5   Title  Pt's 5xSTS time will improve to at least 16 seconds to demonstrate improved balance and BLE strength    Baseline  23.06 sec    Time  5    Period  Weeks    Status  New             Plan - 06/10/18 0910    Clinical Impression Statement  Pt is a 52 y/o F who returns to therapy due to  continuing generalized weakness, impaired balance, and limited ambulatory endurance.  Pt's 5xSTS time implies BLE weakness and impaired balance.  Her Berg Balance Test indicates she is at an increased risk of falling.  Pt uses SPC to perform with decreased speed.  She demonstrates weakness BLEs, espeically with Bil hip F.  Pt will benefit from skilled PT interventions for improved gait mechanics, ambulatory endurance, balance, and strength.     History and Personal Factors relevant to plan of care:  (-) Pt's inconsistent committment to therapy sessions, pt has been non compliant with HEP in the past, pt with multiple co-morbidities  (+) Pt motivated to return to therapy with specific goals in mind, very supportive husband    Clinical Presentation  Evolving    Clinical Presentation due to:  Pt has multiple co-morbidities that affect her ADLs and functional mobility    Clinical Decision Making  Moderate    Rehab Potential  Fair    PT Frequency  2x / week    PT Duration  6 weeks    PT Treatment/Interventions  ADLs/Self Care Home Management;Aquatic Therapy;Cryotherapy;Electrical Stimulation;Moist Heat;Iontophoresis 4mg /ml Dexamethasone;Ultrasound;DME Instruction;Gait training;Stair training;Functional mobility training;Therapeutic activities;Therapeutic exercise;Balance training;Neuromuscular re-education;Cognitive remediation;Patient/family  education;Orthotic Fit/Training;Manual techniques;Wheelchair mobility training;Energy conservation;Taping    PT Next Visit Plan  continue strengthening program, balance, ambulatory endurance    PT Home Exercise Plan  seated marching, standing marching with UE support, mini squats with UE support, LAQ, sit to stand    Recommended Other Services  none at this time    Consulted and Agree with Plan of Care  Patient       Patient will benefit from skilled therapeutic intervention in order to improve the following deficits and impairments:  Abnormal gait, Decreased activity tolerance, Decreased balance, Decreased cognition, Decreased endurance, Decreased knowledge of use of DME, Decreased mobility, Decreased safety awareness, Decreased strength, Difficulty walking, Increased fascial restricitons, Impaired perceived functional ability, Impaired flexibility, Impaired vision/preception, Improper body mechanics, Postural dysfunction  Visit Diagnosis: Muscle weakness (generalized)  Unsteadiness on feet  Other abnormalities of gait and mobility     Problem List Patient Active Problem List   Diagnosis Date Noted  . Cerebral infarction (HCC) 08/14/2016  . Acute CVA (cerebrovascular accident) (HCC) 08/14/2016  . Delirium due to another medical condition 08/13/2016  . Urinary tract infection 08/13/2016  . Right foot infection 05/11/2016  . Foot abscess, right 05/11/2016  . Anxiety and depression 12/02/2015  . CVA (cerebral infarction) 11/27/2015  . Epilepsy (HCC) 06/17/2015  . 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 Headache 05/19/2015  . Weakness of left side of body 05/19/2015  . History of stroke 05/19/2015  . Severe obesity (BMI >= 40) (HCC) 03/19/2014  . HTN (hypertension)   . Diabetes (HCC)   . Hyperlipidemia   . [name] (transient ischemic attack) 03/17/2014    Encarnacion Chu PT, DPT 06/10/2018, 4:26 PM  [facility] MAIN [facility] SERVICES [address]  [place], [place], [address] Phone: [phone]   Fax:  [phone]  Name: [name] MRN: [id number] Date of Birth: Mar 19, 1966

## 2018-06-12 ENCOUNTER — Ambulatory Visit: Payer: BLUE CROSS/BLUE SHIELD | Admitting: Physical Therapy

## 2018-06-19 ENCOUNTER — Ambulatory Visit: Payer: BLUE CROSS/BLUE SHIELD | Admitting: Physical Therapy

## 2018-06-19 ENCOUNTER — Encounter: Payer: Self-pay | Admitting: Physical Therapy

## 2018-06-19 ENCOUNTER — Ambulatory Visit: Payer: BLUE CROSS/BLUE SHIELD | Attending: Neurology | Admitting: Physical Therapy

## 2018-06-19 DIAGNOSIS — R2689 Other abnormalities of gait and mobility: Secondary | ICD-10-CM | POA: Insufficient documentation

## 2018-06-19 DIAGNOSIS — R2681 Unsteadiness on feet: Secondary | ICD-10-CM | POA: Diagnosis present

## 2018-06-19 DIAGNOSIS — M6281 Muscle weakness (generalized): Secondary | ICD-10-CM | POA: Diagnosis present

## 2018-06-19 NOTE — Therapy (Signed)
[facility] MAIN [facility] SERVICES [address] [place], [place], [address] Phone: [phone]   Fax:  [phone]  Physical Therapy Treatment  Patient Details  Name: [name] MRN: [id number] Date of Birth: 1966-08-11 Referring Provider (PT): Dr. Malvin Johns   Encounter Date: 06/19/2018  PT End of Session - 06/19/18 0844    Visit Number  2    Number of Visits  13    Date for PT Re-Evaluation  08/05/18    Authorization Type  Medicaid has approved 12 visits 10/3-11/13    Authorization Time Period  1/12 Medicaid; 1/10 progress note    PT Start Time  0844    PT Stop Time  0923    PT Time Calculation (min)  39 min    Equipment Utilized During Treatment  Gait belt    Activity Tolerance  Patient limited by fatigue    Behavior During Therapy  [facility] for tasks assessed/performed       Past Medical History:  Diagnosis Date  . Allergic rhinitis   . Chickenpox   . Depression   . Diabetes (HCC)   . Epilepsy (HCC)   . Headache   . HTN (hypertension)   . Hyperlipidemia   . Seizures (HCC)   . Stroke (HCC)   . Urinary incontinence     Past Surgical History:  Procedure Laterality Date  . CESAREAN SECTION    . INCISION AND DRAINAGE Right 05/13/2016   Procedure: INCISION AND DRAINAGE;  Surgeon: Gwyneth Revels, DPM;  Location: [facility] ORS;  Service: Podiatry;  Laterality: Right;    There were no vitals filed for this visit.  Subjective Assessment - 06/19/18 0852    Subjective  "I'm sleepy".  Pt denies any pain.  She was able to report 2/4 exercises on her HEP and says she has been doing her HEP just 2 days/wk.  Pt says, "How many times am I supposed to do it?".     Patient is accompained by:  Family member   husband in waiting room   Pertinent History  Pt is well known to this clinic and has returned to therapy due to weakness.  Patient is a 52 y.o. female with history of strokes in 2017 and 2018 reporting left sided weakness and difficulty with ambulation.  Pt is legally blind and uses a cane at all times for assistance with ambulation. PMH is significant for diabetes type 2, hyperlipidemia, hypertension, and obesity. Pt demonstrates LE > UE weakness on the left with general deconditioning and unsteadiness when standing.  Goals: to walk without the cane, to be able to cook independently (be able to stand up long enough to do so).  Pt reports limited ambulatory endurance, with the ability to walk up to 10 minutes.  Family does the driving and running errands so the pt mainly remains in her home.  Pt denies any pain.  Pt reports she has been completing sit<>stand and walking as part of her HEP since she was last seen in this clinic.     Limitations  Standing;Walking;House hold activities    How long can you walk comfortably?  10 minutes    Patient Stated Goals  see above    Currently in Pain?  No/denies        TREATMENT   Ambulating forward in // bars x6 lengths with BUE support. Repeated without UE support x10 lengths.   Marching in sitting x15 each LE   Mini squat x10 with UE support  Standing march with 1UE support x15 each LE. Cues to bring knees up as high as possible.   LAQ x10 each LE   Alternating toe taps up to 6" step with BUE support x10 each LE. Pt unable to hold on with just 1UE support.   Sit<>stand from elevated mat table without UE support 2x10 with cues to avoid using UE support by holding hands out in front of her body   Side stepping over  foam roll with 1UE support x10 each direction   Forward and backward stepping over  foam roll with 1UE support x15 each direction. Pt using intermittent BUE support.   Sideways walking in // bars with no UE support x8 lengths   Backward walking in // bars with no UE support x6 lengths. Had pt hold ball in attempt to prevent pt holding onto // bars but pt continues to reach for // bars.                         PT Education - 06/19/18 0844    Education Details   Exercise technique; emphasized importance of completing HEP at least 5 days/wk    Person(s) Educated  Patient    Methods  Explanation;Demonstration;Verbal cues    Comprehension  Verbalized understanding;Returned demonstration;Verbal cues required;Need further instruction       PT Short Term Goals - 06/10/18 1256      PT SHORT TERM GOAL #1   Title  Pt will independently complete HEP at least 4 days/wk for carryover between sessions    Time  2    Period  Weeks    Status  New        PT Long Term Goals - 06/10/18 1258      PT LONG TERM GOAL #1   Title  Pt's Berg Balance Test will improve to at least 45/56 to demonstrate improved balance    Baseline  32/56    Time  6    Period  Weeks    Status  New      PT LONG TERM GOAL #2   Title  Pt will be able to stand for at least 10 minutes to allow her to participate in cooking at home    Time  6    Period  Weeks    Status  New      PT LONG TERM GOAL #3   Title  Pt's TUG time will improve to at least equal or less than 13 seconds to demonstrate improved gait speed and safety with turns    Baseline  20.09 sec    Time  6    Period  Weeks    Status  New      PT LONG TERM GOAL #4   Title  Pt's will improve to at least 1.0 m/s without cane for improved gait speed    Baseline  0.80 m/s with SPC    Time  4    Period  Weeks    Status  New      PT LONG TERM GOAL #5   Title  Pt's 5xSTS time will improve to at least 16 seconds to demonstrate improved balance and BLE strength    Baseline  23.06 sec    Time  5    Period  Weeks    Status  New            Plan - 06/19/18 0853    Clinical Impression Statement  Pt demonstrated improved ambulatory endurance walking in // bars this session compared to the past.  Pt says she has been working on this some at home.  Emphasized importance of completing HEP in full at least 5 days/wk. reviewed HEP in session with cues provided for proper technique.  Pt demonstrated difficulty with  sit>stand from transfer chair, thus practiced sit>stand from elevated mat table to work toward improved safety with this transfer.  Pt requires constant cues to continue during walking exercises.  Pt will benefit from continued skilled PT interventions for improved independence, strength, and endurance.     Rehab Potential  Fair    PT Frequency  2x / week    PT Duration  6 weeks    PT Treatment/Interventions  ADLs/Self Care Home Management;Aquatic Therapy;Cryotherapy;Electrical Stimulation;Moist Heat;Iontophoresis 4mg /ml Dexamethasone;Ultrasound;DME Instruction;Gait training;Stair training;Functional mobility training;Therapeutic activities;Therapeutic exercise;Balance training;Neuromuscular re-education;Cognitive remediation;Patient/family education;Orthotic Fit/Training;Manual techniques;Wheelchair mobility training;Energy conservation;Taping    PT Next Visit Plan  continue strengthening program, balance, ambulatory endurance    PT Home Exercise Plan  seated marching, standing marching with UE support, mini squats with UE support, LAQ, sit to stand    Consulted and Agree with Plan of Care  Patient       Patient will benefit from skilled therapeutic intervention in order to improve the following deficits and impairments:  Abnormal gait, Decreased activity tolerance, Decreased balance, Decreased cognition, Decreased endurance, Decreased knowledge of use of DME, Decreased mobility, Decreased safety awareness, Decreased strength, Difficulty walking, Increased fascial restricitons, Impaired perceived functional ability, Impaired flexibility, Impaired vision/preception, Improper body mechanics, Postural dysfunction  Visit Diagnosis: Muscle weakness (generalized)  Unsteadiness on feet  Other abnormalities of gait and mobility     Problem List Patient Active Problem List   Diagnosis Date Noted  . Cerebral infarction (HCC) 08/14/2016  . Acute CVA (cerebrovascular accident) (HCC) 08/14/2016  .  Delirium due to another medical condition 08/13/2016  . Urinary tract infection 08/13/2016  . Right foot infection 05/11/2016  . Foot abscess, right 05/11/2016  . Anxiety and depression 12/02/2015  . CVA (cerebral infarction) 11/27/2015  . Epilepsy (HCC) 06/17/2015  . 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 Headache 05/19/2015  . Weakness of left side of body 05/19/2015  . History of stroke 05/19/2015  . Severe obesity (BMI >= 40) (HCC) 03/19/2014  . HTN (hypertension)   . Diabetes (HCC)   . Hyperlipidemia   . [name] (transient ischemic attack) 03/17/2014    Encarnacion Chu PT, DPT 06/19/2018, 9:23 AM  [facility] MAIN [facility] SERVICES [address] [place], [place], [address] Phone: [phone]   Fax:  [phone]  Name: [name] MRN: [id number] Date of Birth: [birth year]

## 2018-06-24 ENCOUNTER — Ambulatory Visit: Payer: BLUE CROSS/BLUE SHIELD

## 2018-06-25 ENCOUNTER — Ambulatory Visit: Payer: BLUE CROSS/BLUE SHIELD

## 2018-06-26 ENCOUNTER — Ambulatory Visit: Payer: BLUE CROSS/BLUE SHIELD | Admitting: Physical Therapy

## 2018-06-27 ENCOUNTER — Ambulatory Visit: Payer: BLUE CROSS/BLUE SHIELD

## 2018-07-03 ENCOUNTER — Ambulatory Visit: Payer: BLUE CROSS/BLUE SHIELD

## 2018-07-15 ENCOUNTER — Encounter: Payer: Self-pay | Admitting: Physical Therapy

## 2018-07-15 ENCOUNTER — Ambulatory Visit: Payer: BLUE CROSS/BLUE SHIELD | Admitting: Physical Therapy

## 2018-07-15 DIAGNOSIS — M6281 Muscle weakness (generalized): Secondary | ICD-10-CM

## 2018-07-15 DIAGNOSIS — R2681 Unsteadiness on feet: Secondary | ICD-10-CM

## 2018-07-15 DIAGNOSIS — R2689 Other abnormalities of gait and mobility: Secondary | ICD-10-CM

## 2018-07-15 NOTE — Therapy (Signed)
[place] [facility] MAIN [facility] SERVICES [address] [place], [place], [address] Phone: [phone]   Fax:  [phone]  Physical Therapy Treatment  Patient Details  Name: [name] MRN: [id number] Date of Birth: [birth year] Referring Provider (PT): Dr. Malvin Johns   Encounter Date: 07/15/2018  PT End of Session - 07/15/18 0912    Visit Number  3    Number of Visits  13    Date for PT Re-Evaluation  08/05/18    Authorization Type  Medicaid has approved 12 visits 10/3-11/13    Authorization Time Period  2/12 Medicaid; 2/10 progress note    PT Start Time  0900    PT Stop Time  0940    PT Time Calculation (min)  40 min    Equipment Utilized During Treatment  Gait belt    Activity Tolerance  Patient limited by fatigue    Behavior During Therapy  [facility] for tasks assessed/performed       Past Medical History:  Diagnosis Date  . Allergic rhinitis   . Chickenpox   . Depression   . Diabetes (HCC)   . Epilepsy (HCC)   . Headache   . HTN (hypertension)   . Hyperlipidemia   . Seizures (HCC)   . Stroke (HCC)   . Urinary incontinence     Past Surgical History:  Procedure Laterality Date  . CESAREAN SECTION    . INCISION AND DRAINAGE Right 05/13/2016   Procedure: INCISION AND DRAINAGE;  Surgeon: Gwyneth Revels, DPM;  Location: [facility] ORS;  Service: Podiatry;  Laterality: Right;    There were no vitals filed for this visit.  Subjective Assessment - 07/15/18 0910    Subjective  "I'm sleepy".  Pt denies any pain.  She reports doing her  exercises on her HEP .    Patient is accompained by:  Family member   husband in waiting room   Pertinent History  Pt is well known to this clinic and has returned to therapy due to weakness.  Patient is a 52 y.o. female with history of strokes in 2017 and 2018 reporting left sided weakness and difficulty with ambulation. Pt is legally blind and uses a cane at all times for assistance with ambulation. PMH is significant for  diabetes type 2, hyperlipidemia, hypertension, and obesity. Pt demonstrates LE > UE weakness on the left with general deconditioning and unsteadiness when standing.  Goals: to walk without the cane, to be able to cook independently (be able to stand up long enough to do so).  Pt reports limited ambulatory endurance, with the ability to walk up to 10 minutes.  Family does the driving and running errands so the pt mainly remains in her home.  Pt denies any pain.  Pt reports she has been completing sit<>stand and walking as part of her HEP since she was last seen in this clinic.     Limitations  Standing;Walking;House hold activities    How long can you sit comfortably?  NA    How long can you stand comfortably?  Roughly 5 min    How long can you walk comfortably?  10 minutes    Diagnostic tests  MRI in 2017- left pontine infarct, infarct anterior margin of posterior limb of left internal capsule, right MCA infarct    Patient Stated Goals  see above    Currently in Pain?  No/denies    Pain Score  0-No pain    Pain Onset  In the past 7 days  pt reports she woke up last weekend with back pain      Treatment: Outcome measures were performed including : 5 x sit to stand, TUG, 10 MW, and timed standing to assess goals  Therapeutic exercise:  Nu-step x 5 mins  LAQ with 2 lbs with 3 sec hold x 15  Marching with 2 lbs x 40 reps   Sit to stand from low mat with UE assist x 10 x 2   Patient needs constant cuing to stay on task and to complete each exercise with correct technique and full ROM   Patient is having difficulty with ambulation today due to her pants being too long and getting caught under her shoe and making it not safe.                      PT Education - 07/15/18 0911    Education Details  review HEP    Person(s) Educated  Patient    Methods  Explanation    Comprehension  Verbalized understanding;Returned demonstration;Need further instruction       PT Short  Term Goals - 06/10/18 1256      PT SHORT TERM GOAL #1   Title  Pt will independently complete HEP at least 4 days/wk for carryover between sessions    Time  2    Period  Weeks    Status  New        PT Long Term Goals - 07/15/18 0914      PT LONG TERM GOAL #1   Title  Pt's Berg Balance Test will improve to at least 45/56 to demonstrate improved balance    Baseline  07/15/18= 32/56     Time  6    Period  Weeks    Status  On-going    Target Date  08/05/18      PT LONG TERM GOAL #2   Title  Pt will be able to stand for at least 10 minutes to allow her to participate in cooking at home    Baseline  07/15/18=  3 mins 41 sec    Time  6    Period  Weeks    Status  On-going    Target Date  08/05/18      PT LONG TERM GOAL #3   Title  Pt's TUG time will improve to at least equal or less than 13 seconds to demonstrate improved gait speed and safety with turns    Baseline  20.09 sec; 07/15/18 = 38. 4 sec     Time  6    Period  Weeks    Status  On-going    Target Date  08/05/18      PT LONG TERM GOAL #4   Title  Pt's will improve to at least 1.0 m/s without cane for improved gait speed    Baseline  0.80 m/s with SPC, 07/15/18= . 50 m/sec     Time  4    Period  Weeks    Status  On-going    Target Date  08/05/18      PT LONG TERM GOAL #5   Title  Pt's 5xSTS time will improve to at least 16 seconds to demonstrate improved balance and BLE strength    Baseline  23.06 sec , 07/15/18= 38.50 sec    Time  5    Period  Weeks    Status  On-going    Target Date  08/05/18  Plan - 07/15/18 0913    Clinical Impression Statement  Patient instructed in beginning LE strengthening exercise. She fatigues quickly requiring cues to increase ROM and increase mobility for better standing tolerance. Patient requires at least 1 rail assist for safety when standing on compliant surfaces. She required cues for better posture and weight shift with advanced balance tasks. Reinforced  HEP    Rehab Potential  Fair    PT Frequency  2x / week    PT Duration  6 weeks    PT Treatment/Interventions  ADLs/Self Care Home Management;Aquatic Therapy;Cryotherapy;Electrical Stimulation;Moist Heat;Iontophoresis 4mg /ml Dexamethasone;Ultrasound;DME Instruction;Gait training;Stair training;Functional mobility training;Therapeutic activities;Therapeutic exercise;Balance training;Neuromuscular re-education;Cognitive remediation;Patient/family education;Orthotic Fit/Training;Manual techniques;Wheelchair mobility training;Energy conservation;Taping    PT Next Visit Plan  continue strengthening program, balance, ambulatory endurance    PT Home Exercise Plan  seated marching, standing marching with UE support, mini squats with UE support, LAQ, sit to stand    Consulted and Agree with Plan of Care  Patient       Patient will benefit from skilled therapeutic intervention in order to improve the following deficits and impairments:  Abnormal gait, Decreased activity tolerance, Decreased balance, Decreased cognition, Decreased endurance, Decreased knowledge of use of DME, Decreased mobility, Decreased safety awareness, Decreased strength, Difficulty walking, Increased fascial restricitons, Impaired perceived functional ability, Impaired flexibility, Impaired vision/preception, Improper body mechanics, Postural dysfunction  Visit Diagnosis: Muscle weakness (generalized)  Unsteadiness on feet  Other abnormalities of gait and mobility     Problem List Patient Active Problem List   Diagnosis Date Noted  . Cerebral infarction (HCC) 08/14/2016  . Acute CVA (cerebrovascular accident) (HCC) 08/14/2016  . Delirium due to another medical condition 08/13/2016  . Urinary tract infection 08/13/2016  . Right foot infection 05/11/2016  . Foot abscess, right 05/11/2016  . Anxiety and depression 12/02/2015  . CVA (cerebral infarction) 11/27/2015  . Epilepsy (HCC) 06/17/2015  . Complicated migraine  06/14/2015  . Headache 05/19/2015  . Weakness of left side of body 05/19/2015  . History of stroke 05/19/2015  . Severe obesity (BMI >= 40) (HCC) 03/19/2014  . HTN (hypertension)   . Diabetes (HCC)   . Hyperlipidemia   . [name] (transient ischemic attack) 03/17/2014    Ezekiel Ina, PT DPT 07/15/2018, 9:41 AM  [place] [facility] MAIN [facility] SERVICES [address] [place], [place], [address] Phone: [phone]   Fax:  [phone]  Name: [name] MRN: [id number] Date of Birth: Nov 25, 1965

## 2018-07-17 ENCOUNTER — Ambulatory Visit: Payer: BLUE CROSS/BLUE SHIELD

## 2018-07-21 ENCOUNTER — Ambulatory Visit: Payer: BLUE CROSS/BLUE SHIELD | Attending: Neurology

## 2018-07-21 VITALS — BP 155/86 | HR 80

## 2018-07-21 DIAGNOSIS — R2689 Other abnormalities of gait and mobility: Secondary | ICD-10-CM | POA: Insufficient documentation

## 2018-07-21 DIAGNOSIS — M6281 Muscle weakness (generalized): Secondary | ICD-10-CM | POA: Diagnosis present

## 2018-07-21 DIAGNOSIS — R2681 Unsteadiness on feet: Secondary | ICD-10-CM

## 2018-07-21 NOTE — Therapy (Signed)
[place] [facility] MAIN [facility] SERVICES [address] [place], [place], [address] Phone: [phone]   Fax:  [phone]  Physical Therapy Treatment  Patient Details  Name: [name] MRN: [id number] Date of Birth: 30-May-1966 Referring Provider (PT): Dr. Malvin Johns   Encounter Date: 07/21/2018  PT End of Session - 07/21/18 0853    Visit Number  4    Number of Visits  13    Date for PT Re-Evaluation  08/05/18    Authorization Type  Medicaid has approved 12 visits 10/3-11/13    Authorization Time Period  3/12 Medicaid; 3/10 progress note    PT Start Time  0847    PT Stop Time  0930    PT Time Calculation (min)  43 min    Equipment Utilized During Treatment  Gait belt    Activity Tolerance  Patient limited by fatigue    Behavior During Therapy  [facility] for tasks assessed/performed       Past Medical History:  Diagnosis Date  . Allergic rhinitis   . Chickenpox   . Depression   . Diabetes (HCC)   . Epilepsy (HCC)   . Headache   . HTN (hypertension)   . Hyperlipidemia   . Seizures (HCC)   . Stroke (HCC)   . Urinary incontinence     Past Surgical History:  Procedure Laterality Date  . CESAREAN SECTION    . INCISION AND DRAINAGE Right 05/13/2016   Procedure: INCISION AND DRAINAGE;  Surgeon: Gwyneth Revels, DPM;  Location: [facility] ORS;  Service: Podiatry;  Laterality: Right;    Vitals:   07/21/18 0853  BP: (!) 155/86  Pulse: 80  SpO2: 100%    Subjective Assessment - 07/21/18 0852    Subjective  Pt reports that she is fine today. Pt denies any pain.  She reports doing her exercises on her HEP. No specific questions or concerns currently.     Patient is accompained by:  Family member   husband in waiting room   Pertinent History  Pt is well known to this clinic and has returned to therapy due to weakness.  Patient is a 52 y.o. female with history of strokes in 2017 and 2018 reporting left sided weakness and difficulty with ambulation. Pt is legally  blind and uses a cane at all times for assistance with ambulation. PMH is significant for diabetes type 2, hyperlipidemia, hypertension, and obesity. Pt demonstrates LE > UE weakness on the left with general deconditioning and unsteadiness when standing.  Goals: to walk without the cane, to be able to cook independently (be able to stand up long enough to do so).  Pt reports limited ambulatory endurance, with the ability to walk up to 10 minutes.  Family does the driving and running errands so the pt mainly remains in her home.  Pt denies any pain.  Pt reports she has been completing sit<>stand and walking as part of her HEP since she was last seen in this clinic.     Limitations  Standing;Walking;House hold activities    How long can you sit comfortably?  NA    How long can you stand comfortably?  Roughly 5 min    How long can you walk comfortably?  10 minutes    Diagnostic tests  MRI in 2017- left pontine infarct, infarct anterior margin of posterior limb of left internal capsule, right MCA infarct    Patient Stated Goals  see above    Currently in Pain?  No/denies  Pain Onset  --          TREATMENT    Ther-ex  NuStep L0-1 x 4 minutes for warm-up during history (3 minutes unbilled); Marching in sitting x 15 each LE; Seated LAQ x 10 each LE; Sit<>stand from elevated mat table without UE support x 7 with cues to avoid using UE support by crossing arms, discontinued exercise after seven reps due to reports of increased L knee pain; Mini squat 2 x 10 with UE support; Standing march with 1UE support 2 x 10 each LE. Cues to bring knees up as high as possible;    Neuromuscular Re-education  Alternating toe taps up to 4" step alternating LE x 10 bilateral, pt instructed to hold ball as she cannot stop reaching for bars when placing her arms across her chest; 6" hurdle steps from staggered stance starting position, pt holds ball in one hand as otherwise she is unable to keep bilateral UE off  bars, x 10 bilateral; Side stepping over  foam roll bilateral UE support x 10 each direction, cues required for mental sequencing to understand that she needs to step far enough to provide space for her contralateral LE;    Pt educated throughout session about proper posture and technique with exercises. Improved exercise technique, movement at target joints, use of target muscles after min to mod verbal, visual, tactile cues.   Pt continues to require seated rest breaks during session today. She also requires frequent redirection to complete all repetitions of her exercises. Pt reports L knee pain with sit to stand so exercise had to be discontinued after 7 repetitions. Pt requires external distraction to prevent her from using her UEs for support during balance activities in parallel bars. Pt encouraged to continue HEP. She will benefit from continued skilled PT interventions for improved independence, strength, and endurance.                       PT Short Term Goals - 06/10/18 1256      PT SHORT TERM GOAL #1   Title  Pt will independently complete HEP at least 4 days/wk for carryover between sessions    Time  2    Period  Weeks    Status  New        PT Long Term Goals - 07/15/18 0914      PT LONG TERM GOAL #1   Title  Pt's Berg Balance Test will improve to at least 45/56 to demonstrate improved balance    Baseline  07/15/18= 32/56     Time  6    Period  Weeks    Status  On-going    Target Date  08/05/18      PT LONG TERM GOAL #2   Title  Pt will be able to stand for at least 10 minutes to allow her to participate in cooking at home    Baseline  07/15/18=  3 mins 41 sec    Time  6    Period  Weeks    Status  On-going    Target Date  08/05/18      PT LONG TERM GOAL #3   Title  Pt's TUG time will improve to at least equal or less than 13 seconds to demonstrate improved gait speed and safety with turns    Baseline  20.09 sec; 07/15/18 = 38. 4 sec     Time   6    Period  Weeks  Status  On-going    Target Date  08/05/18      PT LONG TERM GOAL #4   Title  Pt's will improve to at least 1.0 m/s without cane for improved gait speed    Baseline  0.80 m/s with SPC, 07/15/18= . 50 m/sec     Time  4    Period  Weeks    Status  On-going    Target Date  08/05/18      PT LONG TERM GOAL #5   Title  Pt's 5xSTS time will improve to at least 16 seconds to demonstrate improved balance and BLE strength    Baseline  23.06 sec , 07/15/18= 38.50 sec    Time  5    Period  Weeks    Status  On-going    Target Date  08/05/18            Plan - 07/21/18 0857    Clinical Impression Statement  Pt continues to require seated rest breaks during session today. She also requires frequent redirection to complete all repetitions of her exercises. Pt reports L knee pain with sit to stand so exercise had to be discontinued after 7 repetitions. Pt requires external distraction to prevent her from using her UEs for support during balance activities in parallel bars. Pt encouraged to continue HEP. She will benefit from continued skilled PT interventions for improved independence, strength, and endurance.     Rehab Potential  Fair    PT Frequency  2x / week    PT Duration  6 weeks    PT Treatment/Interventions  ADLs/Self Care Home Management;Aquatic Therapy;Cryotherapy;Electrical Stimulation;Moist Heat;Iontophoresis 4mg /ml Dexamethasone;Ultrasound;DME Instruction;Gait training;Stair training;Functional mobility training;Therapeutic activities;Therapeutic exercise;Balance training;Neuromuscular re-education;Cognitive remediation;Patient/family education;Orthotic Fit/Training;Manual techniques;Wheelchair mobility training;Energy conservation;Taping    PT Next Visit Plan  continue strengthening program, balance, ambulatory endurance    PT Home Exercise Plan  seated marching, standing marching with UE support, mini squats with UE support, LAQ, sit to stand    Consulted  and Agree with Plan of Care  Patient       Patient will benefit from skilled therapeutic intervention in order to improve the following deficits and impairments:  Abnormal gait, Decreased activity tolerance, Decreased balance, Decreased cognition, Decreased endurance, Decreased knowledge of use of DME, Decreased mobility, Decreased safety awareness, Decreased strength, Difficulty walking, Increased fascial restricitons, Impaired perceived functional ability, Impaired flexibility, Impaired vision/preception, Improper body mechanics, Postural dysfunction  Visit Diagnosis: Muscle weakness (generalized)  Unsteadiness on feet  Other abnormalities of gait and mobility     Problem List Patient Active Problem List   Diagnosis Date Noted  . Cerebral infarction (HCC) 08/14/2016  . Acute CVA (cerebrovascular accident) (HCC) 08/14/2016  . Delirium due to another medical condition 08/13/2016  . Urinary tract infection 08/13/2016  . Right foot infection 05/11/2016  . Foot abscess, right 05/11/2016  . Anxiety and depression 12/02/2015  . CVA (cerebral infarction) 11/27/2015  . Epilepsy (HCC) 06/17/2015  . 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 Headache 05/19/2015  . Weakness of left side of body 05/19/2015  . History of stroke 05/19/2015  . Severe obesity (BMI >= 40) (HCC) 03/19/2014  . HTN (hypertension)   . Diabetes (HCC)   . Hyperlipidemia   . [name] (transient ischemic attack) 03/17/2014   Lynnea Maizes PT, DPT, GCS  Huprich,Jason 07/21/2018, 10:22 AM  [place] [facility] MAIN [facility] SERVICES [address] [place], [place], [address] Phone: [phone]   Fax:  [phone]  Name:  [name] MRN: [id number] Date of Birth: 20-Apr-1966

## 2018-07-22 ENCOUNTER — Ambulatory Visit: Payer: Self-pay | Admitting: Urology

## 2018-07-23 ENCOUNTER — Encounter: Payer: Self-pay | Admitting: Urology

## 2018-07-23 ENCOUNTER — Ambulatory Visit: Payer: BLUE CROSS/BLUE SHIELD | Admitting: Physical Therapy

## 2018-07-24 ENCOUNTER — Ambulatory Visit: Payer: BLUE CROSS/BLUE SHIELD

## 2018-07-28 ENCOUNTER — Ambulatory Visit: Payer: BLUE CROSS/BLUE SHIELD

## 2018-09-30 ENCOUNTER — Other Ambulatory Visit: Payer: Self-pay | Admitting: Family Medicine

## 2018-09-30 DIAGNOSIS — Z1231 Encounter for screening mammogram for malignant neoplasm of breast: Secondary | ICD-10-CM

## 2019-04-16 ENCOUNTER — Inpatient Hospital Stay: Payer: BC Managed Care – PPO

## 2019-04-16 ENCOUNTER — Emergency Department: Payer: BC Managed Care – PPO

## 2019-04-16 ENCOUNTER — Inpatient Hospital Stay
Admission: EM | Admit: 2019-04-16 | Discharge: 2019-04-17 | DRG: 071 | Disposition: A | Discharge status: Home Health | Payer: BC Managed Care – PPO | Attending: Specialist | Admitting: Specialist

## 2019-04-16 ENCOUNTER — Encounter: Payer: Self-pay | Admitting: Emergency Medicine

## 2019-04-16 ENCOUNTER — Other Ambulatory Visit: Payer: Self-pay

## 2019-04-16 DIAGNOSIS — Z833 Family history of diabetes mellitus: Secondary | ICD-10-CM

## 2019-04-16 DIAGNOSIS — I639 Cerebral infarction, unspecified: Secondary | ICD-10-CM | POA: Diagnosis present

## 2019-04-16 DIAGNOSIS — G9340 Encephalopathy, unspecified: Principal | ICD-10-CM | POA: Diagnosis present

## 2019-04-16 DIAGNOSIS — Z20828 Contact with and (suspected) exposure to other viral communicable diseases: Secondary | ICD-10-CM | POA: Diagnosis present

## 2019-04-16 DIAGNOSIS — Z794 Long term (current) use of insulin: Secondary | ICD-10-CM

## 2019-04-16 DIAGNOSIS — Z79899 Other long term (current) drug therapy: Secondary | ICD-10-CM | POA: Diagnosis not present

## 2019-04-16 DIAGNOSIS — Z6841 Body Mass Index (BMI) 40.0 and over, adult: Secondary | ICD-10-CM | POA: Diagnosis not present

## 2019-04-16 DIAGNOSIS — E1165 Type 2 diabetes mellitus with hyperglycemia: Secondary | ICD-10-CM | POA: Diagnosis present

## 2019-04-16 DIAGNOSIS — Z8673 Personal history of transient ischemic attack (TIA), and cerebral infarction without residual deficits: Secondary | ICD-10-CM

## 2019-04-16 DIAGNOSIS — E785 Hyperlipidemia, unspecified: Secondary | ICD-10-CM | POA: Diagnosis present

## 2019-04-16 DIAGNOSIS — I361 Nonrheumatic tricuspid (valve) insufficiency: Secondary | ICD-10-CM | POA: Diagnosis not present

## 2019-04-16 DIAGNOSIS — Z9114 Patient's other noncompliance with medication regimen: Secondary | ICD-10-CM | POA: Diagnosis not present

## 2019-04-16 DIAGNOSIS — Z7982 Long term (current) use of aspirin: Secondary | ICD-10-CM

## 2019-04-16 DIAGNOSIS — R739 Hyperglycemia, unspecified: Secondary | ICD-10-CM

## 2019-04-16 DIAGNOSIS — I10 Essential (primary) hypertension: Secondary | ICD-10-CM | POA: Diagnosis present

## 2019-04-16 DIAGNOSIS — R4182 Altered mental status, unspecified: Secondary | ICD-10-CM | POA: Diagnosis not present

## 2019-04-16 LAB — BASIC METABOLIC PANEL
Anion gap: 15 (ref 5–15)
BUN: 11 mg/dL (ref 6–20)
CO2: 21 mmol/L — ABNORMAL LOW (ref 22–32)
Calcium: 8.8 mg/dL — ABNORMAL LOW (ref 8.9–10.3)
Chloride: 99 mmol/L (ref 98–111)
Creatinine, Ser: 1.02 mg/dL — ABNORMAL HIGH (ref 0.44–1.00)
GFR calc Af Amer: >60 mL/min (ref >60)
GFR calc non Af Amer: >60 mL/min (ref >60)
Glucose, Bld: 514 mg/dL (ref 70–99)
Potassium: 4.1 mmol/L (ref 3.5–5.1)
Sodium: 135 mmol/L (ref 135–145)

## 2019-04-16 LAB — URINALYSIS, COMPLETE (UACMP) WITH MICROSCOPIC
Bacteria, UA: NONE SEEN
Bilirubin Urine: NEGATIVE
Glucose, UA: >=500 mg/dL — AB
Hgb urine dipstick: NEGATIVE
Ketones, ur: 20 mg/dL — AB
Leukocytes,Ua: NEGATIVE
Nitrite: NEGATIVE
Protein, ur: NEGATIVE mg/dL
Specific Gravity, Urine: 1.028 (ref 1.005–1.030)
pH: 7 (ref 5.0–8.0)

## 2019-04-16 LAB — CBC
HCT: 37.4 % (ref 36.0–46.0)
Hemoglobin: 11.4 g/dL — ABNORMAL LOW (ref 12.0–15.0)
MCH: 19.8 pg — ABNORMAL LOW (ref 26.0–34.0)
MCHC: 30.5 g/dL (ref 30.0–36.0)
MCV: 64.9 fL — ABNORMAL LOW (ref 80.0–100.0)
Platelets: 182 10*3/uL (ref 150–400)
RBC: 5.76 MIL/uL — ABNORMAL HIGH (ref 3.87–5.11)
RDW: 20.4 % — ABNORMAL HIGH (ref 11.5–15.5)
WBC: 10.8 10*3/uL — ABNORMAL HIGH (ref 4.0–10.5)
nRBC: 0 % (ref 0.0–0.2)

## 2019-04-16 LAB — GLUCOSE, CAPILLARY
Glucose-Capillary: 509 mg/dL (ref 70–99)
Glucose-Capillary: 281 mg/dL — ABNORMAL HIGH (ref 70–99)
Glucose-Capillary: 307 mg/dL — ABNORMAL HIGH (ref 70–99)
Glucose-Capillary: 226 mg/dL — ABNORMAL HIGH (ref 70–99)

## 2019-04-16 LAB — SARS CORONAVIRUS 2 BY RT PCR (HOSPITAL ORDER, PERFORMED IN ~~LOC~~ HOSPITAL LAB): SARS Coronavirus 2: NEGATIVE

## 2019-04-16 LAB — VALPROIC ACID LEVEL: Valproic Acid Lvl: <10 ug/mL — ABNORMAL LOW (ref 50.0–100.0)

## 2019-04-16 LAB — HEMOGLOBIN A1C
Hgb A1c MFr Bld: 11.5 % — ABNORMAL HIGH (ref 4.8–5.6)
Mean Plasma Glucose: 283.35 mg/dL

## 2019-04-16 MED ORDER — INSULIN ASPART 100 UNIT/ML ~~LOC~~ SOLN
10.0000 [IU] | Freq: Three times a day (TID) | SUBCUTANEOUS | Status: DC
  Administered 2019-04-16 – 2019-04-17 (×3): 10 [IU] via SUBCUTANEOUS
  Filled 2019-04-16 (×3): qty 1

## 2019-04-16 MED ORDER — INSULIN ASPART 100 UNIT/ML ~~LOC~~ SOLN
0.0000 [IU] | Freq: Every day | SUBCUTANEOUS | Status: DC
  Administered 2019-04-16: 5 [IU] via SUBCUTANEOUS
  Filled 2019-04-16: qty 1

## 2019-04-16 MED ORDER — DIVALPROEX SODIUM ER 500 MG PO TB24
500.0000 mg | ORAL_TABLET | Freq: Every day | ORAL | Status: DC
  Administered 2019-04-16 – 2019-04-17 (×2): 500 mg via ORAL
  Filled 2019-04-16 (×2): qty 1

## 2019-04-16 MED ORDER — METOPROLOL TARTRATE 25 MG PO TABS
25.0000 mg | ORAL_TABLET | Freq: Two times a day (BID) | ORAL | Status: DC
  Administered 2019-04-16 – 2019-04-17 (×2): 25 mg via ORAL
  Filled 2019-04-16 (×2): qty 1

## 2019-04-16 MED ORDER — INSULIN ASPART 100 UNIT/ML ~~LOC~~ SOLN
10.0000 [IU] | Freq: Once | SUBCUTANEOUS | Status: AC
  Administered 2019-04-16: 10 [IU] via INTRAVENOUS
  Filled 2019-04-16: qty 1

## 2019-04-16 MED ORDER — ACETAMINOPHEN 650 MG RE SUPP: 650.0000 mg | RECTAL | Status: DC | PRN

## 2019-04-16 MED ORDER — INSULIN DETEMIR 100 UNIT/ML ~~LOC~~ SOLN
40.0000 [IU] | Freq: Every day | SUBCUTANEOUS | Status: DC
  Administered 2019-04-16: 22:00:00 40 [IU] via SUBCUTANEOUS
  Filled 2019-04-16 (×2): qty 0.4

## 2019-04-16 MED ORDER — STROKE: EARLY STAGES OF RECOVERY BOOK
Freq: Once | Status: AC
  Administered 2019-04-16: 17:00:00

## 2019-04-16 MED ORDER — LEVETIRACETAM IN NACL 1000 MG/100ML IV SOLN
1000.0000 mg | Freq: Two times a day (BID) | INTRAVENOUS | Status: DC
  Administered 2019-04-16 – 2019-04-17 (×2): 1000 mg via INTRAVENOUS
  Filled 2019-04-16 (×5): qty 100

## 2019-04-16 MED ORDER — ENOXAPARIN SODIUM 40 MG/0.4ML ~~LOC~~ SOLN
40.0000 mg | SUBCUTANEOUS | Status: DC
  Administered 2019-04-16: 40 mg via SUBCUTANEOUS
  Filled 2019-04-16: qty 0.4

## 2019-04-16 MED ORDER — ASPIRIN EC 325 MG PO TBEC
325.0000 mg | DELAYED_RELEASE_TABLET | Freq: Every day | ORAL | Status: DC
  Administered 2019-04-16 – 2019-04-17 (×2): 325 mg via ORAL
  Filled 2019-04-16 (×2): qty 1

## 2019-04-16 MED ORDER — SODIUM CHLORIDE 0.9 % IV SOLN
Freq: Once | INTRAVENOUS | Status: AC
  Administered 2019-04-16: 10:00:00 via INTRAVENOUS

## 2019-04-16 MED ORDER — INSULIN ASPART 100 UNIT/ML ~~LOC~~ SOLN
0.0000 [IU] | Freq: Three times a day (TID) | SUBCUTANEOUS | Status: DC
  Administered 2019-04-16: 7 [IU] via SUBCUTANEOUS
  Administered 2019-04-17: 3 [IU] via SUBCUTANEOUS
  Administered 2019-04-17: 5 [IU] via SUBCUTANEOUS
  Filled 2019-04-16 (×3): qty 1

## 2019-04-16 MED ORDER — SODIUM CHLORIDE 0.9 % IV SOLN
INTRAVENOUS | Status: DC
  Administered 2019-04-16: 17:00:00 via INTRAVENOUS

## 2019-04-16 MED ORDER — ACETAMINOPHEN 160 MG/5ML PO SOLN
650.0000 mg | ORAL | Status: DC | PRN
  Filled 2019-04-16: qty 20.3

## 2019-04-16 MED ORDER — ACETAMINOPHEN 325 MG PO TABS: 650.0000 mg | ORAL_TABLET | ORAL | Status: DC | PRN

## 2019-04-16 MED ORDER — ATORVASTATIN CALCIUM 20 MG PO TABS
40.0000 mg | ORAL_TABLET | Freq: Every day | ORAL | Status: DC
  Administered 2019-04-16: 40 mg via ORAL
  Filled 2019-04-16: qty 2

## 2019-04-16 NOTE — Progress Notes (Signed)
Pt with altered mental status, unable to make this decision at this time

## 2019-04-16 NOTE — H&P (Signed)
[employer] - [place] [facility]   PATIENT NAME: [name]    MR#:  [id number]  DATE OF BIRTH:  11/15/1965  DATE OF ADMISSION:  04/16/2019  PRIMARY CARE PHYSICIAN: [facility]   REQUESTING/REFERRING PHYSICIAN: Shaune PollackIsaacs, Cameron, MD  CHIEF COMPLAINT:   Chief Complaint  Patient presents with  . Loss of Consciousness    HISTORY OF PRESENT ILLNESS: [name]  is a 53 y.o. female with a known history of diabetes type 2, depression, hypertension, hyperlipidemia, seizure and a previous who was brought to the hospital with decreasing responsiveness.  Earlier this morning patient's husband was unable to wake his wife up.  He tried to wake her up with aggressive movements and she was unable to arouse.  When EMS arrived they were able to wake her up with painful stimuli.  Initially in the emergency room she was noted to have a blood sugar of 488.  There was no witnessed seizure.  CT scan suggestive of acute pontine stroke.  Currently patient is more awake and able to state that she knows she is in the hospital.  Otherwise she is not able to provide much review of systems.      stroke PAST MEDICAL HISTORY:   Past Medical History:  Diagnosis Date  . Allergic rhinitis   . Chickenpox   . Depression   . Diabetes (HCC)   . Epilepsy (HCC)   . Headache   . HTN (hypertension)   . Hyperlipidemia   . Seizures (HCC)   . Stroke (HCC)   . Urinary incontinence     PAST SURGICAL HISTORY:  Past Surgical History:  Procedure Laterality Date  . CESAREAN SECTION    . INCISION AND DRAINAGE Right 05/13/2016   Procedure: INCISION AND DRAINAGE;  Surgeon: Gwyneth RevelsJustin Fowler, DPM;  Location: [facility] ORS;  Service: Podiatry;  Laterality: Right;    SOCIAL HISTORY:  Social History   Tobacco Use  . Smoking status: Never Smoker  . Smokeless tobacco: Never Used  Substance Use Topics  . Alcohol use: No    Alcohol/week: 0.0 standard drinks    FAMILY HISTORY:  Family  History  Problem Relation Age of Onset  . Hypertension Mother   . Diabetes Mellitus II Mother   . Hypertension Father   . Pancreatic cancer Father     DRUG ALLERGIES: No Known Allergies  REVIEW OF SYSTEMS:   CONSTITUTIONAL: Limited due to current medical illness MEDICATIONS AT HOME:  Prior to Admission medications   Medication Sig Start Date End Date Taking? Authorizing Provider  aspirin 325 MG EC tablet Take 1 tablet (325 mg total) by mouth daily. 08/16/16  Yes Ghimire, Werner LeanShanker M, MD  atorvastatin (LIPITOR) 40 MG tablet Take 1 tablet (40 mg total) by mouth daily at 6 PM. 08/16/16  Yes Ghimire, Werner LeanShanker M, MD  divalproex (DEPAKOTE ER) 500 MG 24 hr tablet Take 1 tablet (500 mg total) by mouth daily. 08/16/16  Yes Ghimire, Werner LeanShanker M, MD  glipiZIDE (GLUCOTROL XL) 5 MG 24 hr tablet Take 1 tablet (5 mg total) by mouth daily. 08/16/16  Yes Ghimire, Werner LeanShanker M, MD  insulin aspart (NOVOLOG FLEXPEN) 100 UNIT/ML FlexPen Inject 10 Units into the skin 3 (three) times daily with meals. 08/16/16  Yes Ghimire, Werner LeanShanker M, MD  Insulin Detemir (LEVEMIR FLEXPEN) 100 UNIT/ML Pen Inject 40 Units into the skin daily at 10 pm. 08/16/16  Yes Ghimire, Werner LeanShanker M, MD  lisinopril-hydrochlorothiazide (PRINZIDE,ZESTORETIC) 20-12.5 MG tablet Take 1 tablet by mouth  daily. 08/16/16  Yes Ghimire, Werner LeanShanker M, MD  metFORMIN (GLUCOPHAGE) 500 MG tablet Take 1 tablet (500 mg total) by mouth 2 (two) times daily with a meal. 08/16/16  Yes Ghimire, Werner LeanShanker M, MD  metoprolol tartrate (LOPRESSOR) 25 MG tablet Take 25 mg by mouth 2 (two) times a day.   Yes [provider]  etodolac (LODINE) 400 MG tablet Take 400 mg by mouth daily as needed.    [provider]      PHYSICAL EXAMINATION:   VITAL SIGNS: Blood pressure (!) 189/106, pulse (!) 105, temperature 98.2 F (36.8 C), temperature source Oral, resp. rate (!) 22, height 5\' 6"  (1.676 m), weight 114.3 kg, SpO2 98 %.  GENERAL:  53 y.o.-year-old patient lying in  the bed morbidly obese  eYES: Pupils equal, round, reactive to light and accommodation. No scleral icterus. Extraocular muscles intact.  HEENT: Head atraumatic, normocephalic. Oropharynx and nasopharynx clear.  NECK:  Supple, no jugular venous distention. No thyroid enlargement, no tenderness.  LUNGS: Normal breath sounds bilaterally, no wheezing, rales,rhonchi or crepitation. No use of accessory muscles of respiration.  CARDIOVASCULAR: S1, S2 normal. No murmurs, rubs, or gallops.  ABDOMEN: Soft, nontender, nondistended. Bowel sounds present. No organomegaly or mass.  EXTREMITIES: No pedal edema, cyanosis, or clubbing.  NEUROLOGIC: Cranial nerves II through XII are intact.  Left upper extremity 3 out of 5, right upper extremity strength is 4-5 out of 5.  Able to move both lower extremities  pSYCHIATRIC: The patient is currently awake oriented to place not time SKIN: No obvious rash, lesion, or ulcer.   LABORATORY PANEL:   CBC Recent Labs  Lab 04/16/19 0757  WBC 10.8*  HGB 11.4*  HCT 37.4  PLT 182  MCV 64.9*  MCH 19.8*  MCHC 30.5  RDW 20.4*   ------------------------------------------------------------------------------------------------------------------  Chemistries  Recent Labs  Lab 04/16/19 0757  NA 135  K 4.1  CL 99  CO2 21*  GLUCOSE 514*  BUN 11  CREATININE 1.02*  CALCIUM 8.8*   ------------------------------------------------------------------------------------------------------------------ estimated creatinine clearance is 81.9 mL/min (A) (by C-G formula based on SCr of 1.02 mg/dL (H)). ------------------------------------------------------------------------------------------------------------------ No results for input(s): TSH, T4TOTAL, T3FREE, THYROIDAB in the last 72 hours.  Invalid input(s): FREET3   Coagulation profile No results for input(s): INR, PROTIME in the last 168  hours. ------------------------------------------------------------------------------------------------------------------- No results for input(s): DDIMER in the last 72 hours. -------------------------------------------------------------------------------------------------------------------  Cardiac Enzymes No results for input(s): CKMB, TROPONINI, MYOGLOBIN in the last 168 hours.  Invalid input(s): CK ------------------------------------------------------------------------------------------------------------------ Invalid input(s): POCBNP  ---------------------------------------------------------------------------------------------------------------  Urinalysis    Component Value Date/Time   COLORURINE STRAW (A) 04/16/2019 1135   APPEARANCEUR CLEAR (A) 04/16/2019 1135   APPEARANCEUR Clear 12/27/2014 1806   LABSPEC 1.028 04/16/2019 1135   LABSPEC 1.035 12/27/2014 1806   PHURINE 7.0 04/16/2019 1135   GLUCOSEU >=500 (A) 04/16/2019 1135   GLUCOSEU >=500 12/27/2014 1806   HGBUR NEGATIVE 04/16/2019 1135   BILIRUBINUR NEGATIVE 04/16/2019 1135   BILIRUBINUR Negative 12/27/2014 1806   KETONESUR 20 (A) 04/16/2019 1135   PROTEINUR NEGATIVE 04/16/2019 1135   NITRITE NEGATIVE 04/16/2019 1135   LEUKOCYTESUR NEGATIVE 04/16/2019 1135   LEUKOCYTESUR Negative 12/27/2014 1806     RADIOLOGY: Ct Head Wo Contrast  Result Date: 04/16/2019 CLINICAL DATA:  53 year old female with acute confusion, poorly arousable this morning. EXAM: CT HEAD WITHOUT CONTRAST TECHNIQUE: Contiguous axial images were obtained from the base of the skull through the vertex without intravenous contrast. COMPARISON:  Brain MRI 08/14/2016 and earlier. FINDINGS: Brain:  Chronic right hemisphere encephalomalacia, primarily in the right MCA territory but also involving the thalamus is stable since 2017. New hypodensity in the brainstem since 2017, bilateral but greater on the left (series 2, image 9). No midline shift,  ventriculomegaly, mass effect, evidence of mass lesion, intracranial hemorrhage or evidence of cortically based acute infarction. Vascular: Calcified atherosclerosis at the skull base. No suspicious intracranial vascular hyperdensity. Skull: No acute osseous abnormality identified. Sinuses/Orbits: Visualized paranasal sinuses and mastoids are stable and well pneumatized. Other: Similar Disconjugate gaze. Visualized scalp soft tissues are within normal limits. IMPRESSION: 1. 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 2. Chronic right hemisphere encephalomalacia is stable. No acute intracranial hemorrhage. Electronically Signed   By: Genevie Ann M.D.   On: 04/16/2019 09:41   Dg Chest Portable 1 View  Result Date: 04/16/2019 CLINICAL DATA:  Altered mental status EXAM: PORTABLE CHEST 1 VIEW COMPARISON:  Chest radiograph 05/12/2016 FINDINGS: Monitoring leads overlie the patient. Stable cardiomegaly. Bibasilar atelectasis. No large area of pulmonary consolidation. No pleural effusion or pneumothorax. IMPRESSION: No acute cardiopulmonary process. Cardiomegaly. Electronically Signed   By: Lovey Newcomer M.D.   On: 04/16/2019 09:28    EKG: Orders placed or performed during the hospital encounter of 04/16/19  . EKG 12-Lead  . EKG 12-Lead  . ED EKG  . ED EKG    IMPRESSION AND PLAN: Patient is 53 year old with previous history of stroke and seizure being admitted with acute encephalopathy  1.  Altered mental status suspect due to combination of a stroke and a possible seizure for now I will place patient on IV Keppra, patient will get MRI of the brain.  I have notified neurology of the consult Patient is on valproic acid I will check valproic acid levels  2.  Small pontine stroke we will obtain MRI of the brain echocardiogram of the heart carotid Dopplers treat with aspirin, continue cholesterol-lowering medication We will obtain a swallow eval, PT  evaluation Monitor on telemetry   3.  Diabetes type 2 with poor control we will check a hemoglobin A1c Place her on sliding scale insulin continue her Levemir We will give her some IV hydration  4.  Essential hypertension continue metoprolol, continue lisinopril HCTZ  5.  Hyperlipidemia continue Lipitor   6.  [place] Lovenox for DVT prophylaxis   All the records are reviewed and case discussed with ED provider. Management plans discussed with the patient, family and they are in agreement.  CODE STATUS: Code Status History    Date Active Date Inactive Code Status Order ID Comments User Context   08/14/2016 2201 08/16/2016 2245 Full Code [id number]  Lily Kocher, MD Inpatient   05/11/2016 1729 05/15/2016 1514 Full Code [id number]  Theodoro Grist, MD Inpatient   11/27/2015 1235 11/28/2015 1626 Full Code [id number]  Henreitta Leber, MD ED   06/14/2015 0620 06/15/2015 2214 Full Code [id number]  Harrie Foreman, MD Inpatient   05/19/2015 0111 05/19/2015 2041 Full Code [id number]  Juluis Mire, MD Inpatient   Advance Care Planning Activity       TOTAL TIME TAKING CARE OF THIS PATIENT55 minutes.    Dustin Flock M.D on 04/16/2019 at 2:39 PM  Between 7am to 6pm - Pager - [phone]  After 6pm go to [email or web address] - Proofreader  [employer] Office  [phone]  CC: Primary care physician; [facility] [place]

## 2019-04-16 NOTE — ED Provider Notes (Addendum)
[facility] Emergency Department Provider Note  ____________________________________________   [facility] [phone]     (approximate)  I have reviewed the triage vital signs and the nursing notes.   HISTORY  Chief Complaint Loss of Consciousness    HPI [name] is a 53 y.o. female with extensive past medical history as below including history of stroke, reported epilepsy, here with altered mental status.  Patient arrives via EMS.  History initially limited.  However, upon husband's arrival, he states that she was in her usual state of health last night.  She has baseline fairly immobilized due to her history of stroke, and he is her primary caregiver.  When he got home from work overnight, this morning, the patient was drowsy.  Try to wake her up and she would not respond.  He tried to sternal rub her without significant response.  She has been increasingly awake and alert since then, but remains significantly more drowsy than usual.  Not recall any tongue biting or loss of bowel or bladder continence, though she has baseline incontinence.  Denies any witnessed seizure activity.  She denies any complaints on my assessment.  Level 5 caveat invoked as remainder of history, ROS, and physical exam limited due to patient's AMS.         Past Medical History:  Diagnosis Date   Allergic rhinitis    Chickenpox    Depression    Diabetes (HCC)    Epilepsy (HCC)    Headache    HTN (hypertension)    Hyperlipidemia    Seizures (HCC)    Stroke [facility](HCC)    Urinary incontinence     Patient Active Problem List   Diagnosis Date Noted   Cerebral infarction (HCC) 08/14/2016   Acute CVA (cerebrovascular accident) (HCC) 08/14/2016   Delirium due to another medical condition 08/13/2016   Urinary tract infection 08/13/2016   Right foot infection 05/11/2016   Foot abscess, right 05/11/2016   Anxiety and depression  12/02/2015   CVA (cerebral infarction) 11/27/2015   Epilepsy (HCC) 06/17/2015   Complicated migraine 06/14/2015   Headache 05/19/2015   Weakness of left side of body 05/19/2015   History of stroke 05/19/2015   Severe obesity (BMI >= 40) (HCC) 03/19/2014   HTN (hypertension)    Diabetes (HCC)    Hyperlipidemia    [name] (transient ischemic attack) 03/17/2014    Past Surgical History:  Procedure Laterality Date   CESAREAN SECTION     INCISION AND DRAINAGE Right 05/13/2016   Procedure: INCISION AND DRAINAGE;  Surgeon: Gwyneth RevelsJustin Fowler, DPM;  Location: [facility] ORS;  Service: Podiatry;  Laterality: Right;    Prior to Admission medications   Medication Sig Start Date End Date Taking? Authorizing Provider  aspirin 325 MG EC tablet Take 1 tablet (325 mg total) by mouth daily. 08/16/16  Yes Ghimire, Werner LeanShanker M, MD  atorvastatin (LIPITOR) 40 MG tablet Take 1 tablet (40 mg total) by mouth daily at 6 PM. 08/16/16  Yes Ghimire, Werner LeanShanker M, MD  divalproex (DEPAKOTE ER) 500 MG 24 hr tablet Take 1 tablet (500 mg total) by mouth daily. 08/16/16  Yes Ghimire, Werner LeanShanker M, MD  glipiZIDE (GLUCOTROL XL) 5 MG 24 hr tablet Take 1 tablet (5 mg total) by mouth daily. 08/16/16  Yes Ghimire, Werner LeanShanker M, MD  insulin aspart (NOVOLOG FLEXPEN) 100 UNIT/ML FlexPen Inject 10 Units into the skin 3 (three) times daily with meals. 08/16/16  Yes Ghimire, Werner LeanShanker M, MD  Insulin Detemir (LEVEMIR FLEXPEN) 100 UNIT/ML Pen Inject 40 Units into the skin daily at 10 pm. 08/16/16  Yes Ghimire, Werner LeanShanker M, MD  lisinopril-hydrochlorothiazide (PRINZIDE,ZESTORETIC) 20-12.5 MG tablet Take 1 tablet by mouth daily. 08/16/16  Yes Ghimire, Werner LeanShanker M, MD  metFORMIN (GLUCOPHAGE) 500 MG tablet Take 1 tablet (500 mg total) by mouth 2 (two) times daily with a meal. 08/16/16  Yes Ghimire, Werner LeanShanker M, MD  metoprolol tartrate (LOPRESSOR) 25 MG tablet Take 25 mg by mouth 2 (two) times a day.   Yes [provider]  etodolac (LODINE) 400 MG  tablet Take 400 mg by mouth daily as needed.    [provider]    Allergies Patient has no known allergies.  Family History  Problem Relation Age of Onset   Hypertension Mother    Diabetes Mellitus II Mother    Hypertension Father    Pancreatic cancer Father     Social History Social History   Tobacco Use   Smoking status: Never Smoker   Smokeless tobacco: Never Used  Substance Use Topics   Alcohol use: No    Alcohol/week: 0.0 standard drinks   Drug use: No    Review of Systems  Review of Systems  Unable to perform ROS: Mental status change     ____________________________________________  PHYSICAL EXAM:      VITAL SIGNS: ED Triage Vitals  Enc Vitals Group     BP 04/16/19 0755 (!) 160/86     Pulse Rate 04/16/19 0755 (!) 105     Resp 04/16/19 0755 (!) 22     Temp 04/16/19 0755 98.2 F (36.8 C)     Temp Source 04/16/19 0755 Oral     SpO2 04/16/19 0755 95 %     Weight 04/16/19 0754 252 lb (114.3 kg)     Height 04/16/19 0754 5\' 6"  (1.676 m)     Head Circumference --      Peak Flow --      Pain Score --      Pain Loc --      Pain Edu? --      Excl. in GC? --      Physical Exam Vitals signs and nursing note reviewed.  Constitutional:      Appearance: She is well-developed.  HENT:     Head: Normocephalic and atraumatic.  Eyes:     Conjunctiva/sclera: Conjunctivae normal.  Neck:     Musculoskeletal: Neck supple.  Cardiovascular:     Rate and Rhythm: Normal rate and regular rhythm.     Heart sounds: Normal heart sounds. No murmur. No friction rub.  Pulmonary:     Effort: Pulmonary effort is normal. No respiratory distress.     Breath sounds: Normal breath sounds. No wheezing or rales.  Abdominal:     General: There is no distension.     Palpations: Abdomen is soft.     Tenderness: There is no abdominal tenderness.  Skin:    General: Skin is warm.     Capillary Refill: Capillary refill takes less than 2 seconds.  Neurological:      Mental Status: She is confused.     Motor: No abnormal muscle tone.     Comments: Baseline weakness LUE and LLE. CN intact. Speech slightly slurred. No other CN deficits.        ____________________________________________   LABS (all labs ordered are listed, but only abnormal results are displayed)  Labs Reviewed  BASIC METABOLIC PANEL - Abnormal; Notable for the following  components:      Result Value   CO2 21 (*)    Glucose, Bld 514 (*)    Creatinine, Ser 1.02 (*)    Calcium 8.8 (*)    All other components within normal limits  CBC - Abnormal; Notable for the following components:   WBC 10.8 (*)    RBC 5.76 (*)    Hemoglobin 11.4 (*)    MCV 64.9 (*)    MCH 19.8 (*)    RDW 20.4 (*)    All other components within normal limits  URINALYSIS, COMPLETE (UACMP) WITH MICROSCOPIC - Abnormal; Notable for the following components:   Color, Urine STRAW (*)    APPearance CLEAR (*)    Glucose, UA >=500 (*)    Ketones, ur 20 (*)    All other components within normal limits  GLUCOSE, CAPILLARY - Abnormal; Notable for the following components:   Glucose-Capillary 509 (*)    All other components within normal limits  GLUCOSE, CAPILLARY - Abnormal; Notable for the following components:   Glucose-Capillary 281 (*)    All other components within normal limits  VALPROIC ACID LEVEL - Abnormal; Notable for the following components:   Valproic Acid Lvl <10 (*)    All other components within normal limits  SARS CORONAVIRUS 2 (HOSPITAL ORDER, [place] [facility])  HEMOGLOBIN A1C  CBG MONITORING, ED  CBG MONITORING, ED    ____________________________________________  EKG: Sinus tachycardia, ventricular rate 104.  QRS 89, QTc 478.  No acute ischemic changes. ________________________________________  RADIOLOGY All imaging, including plain films, CT scans, and ultrasounds, independently reviewed by me, and interpretations confirmed via formal radiology reads.  ED MD  interpretation:   CT head: Possible new pontine infarct compared to prior Chest x-ray: No acute abnormality  Official radiology report(s): Ct Head Wo Contrast  Result Date: 04/16/2019 CLINICAL DATA:  53 year old female with acute confusion, poorly arousable this morning. EXAM: CT HEAD WITHOUT CONTRAST TECHNIQUE: Contiguous axial images were obtained from the base of the skull through the vertex without intravenous contrast. COMPARISON:  Brain MRI 08/14/2016 and earlier. FINDINGS: Brain: Chronic right hemisphere encephalomalacia, primarily in the right MCA territory but also involving the thalamus is stable since 2017. New hypodensity in the brainstem since 2017, bilateral but greater on the left (series 2, image 9). No midline shift, ventriculomegaly, mass effect, evidence of mass lesion, intracranial hemorrhage or evidence of cortically based acute infarction. Vascular: Calcified atherosclerosis at the skull base. No suspicious intracranial vascular hyperdensity. Skull: No acute osseous abnormality identified. Sinuses/Orbits: Visualized paranasal sinuses and mastoids are stable and well pneumatized. Other: Similar Disconjugate gaze. Visualized scalp soft tissues are within normal limits. IMPRESSION: 1. 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 2. Chronic right hemisphere encephalomalacia is stable. No acute intracranial hemorrhage. Electronically Signed   By: Genevie Ann M.D.   On: 04/16/2019 09:41   Dg Chest Portable 1 View  Result Date: 04/16/2019 CLINICAL DATA:  Altered mental status EXAM: PORTABLE CHEST 1 VIEW COMPARISON:  Chest radiograph 05/12/2016 FINDINGS: Monitoring leads overlie the patient. Stable cardiomegaly. Bibasilar atelectasis. No large area of pulmonary consolidation. No pleural effusion or pneumothorax. IMPRESSION: No acute cardiopulmonary process. Cardiomegaly. Electronically Signed   By: Lovey Newcomer M.D.   On: 04/16/2019 09:28     ____________________________________________  PROCEDURES   Procedure(s) performed (including Critical Care):  .Critical Care Performed by: Duffy Bruce, MD Authorized by: Duffy Bruce, MD   Critical care provider statement:  Critical care time (minutes):  35   Critical care time was exclusive of:  Separately billable procedures and treating other patients and teaching time   Critical care was necessary to treat or prevent imminent or life-threatening deterioration of the following conditions:  Circulatory failure, cardiac failure, dehydration, metabolic crisis and CNS failure or compromise   Critical care was time spent personally by me on the following activities:  Development of treatment plan with patient or surrogate, discussions with consultants, evaluation of patient's response to treatment, examination of patient, obtaining history from patient or surrogate, ordering and performing treatments and interventions, ordering and review of laboratory studies, ordering and review of radiographic studies, pulse oximetry, re-evaluation of patient's condition and review of old charts   I assumed direction of critical care for this patient from another provider in my specialty: no      ____________________________________________  INITIAL IMPRESSION / MDM / ASSESSMENT AND PLAN / ED COURSE  As part of my medical decision making, I reviewed the following data within the electronic MEDICAL RECORD NUMBER Notes from prior ED visits and [place] Controlled Substance Database      *[name] was evaluated in Emergency Department on 04/16/2019 for the symptoms described in the history of present illness. She was evaluated in the context of the global COVID-19 pandemic, which necessitated consideration that the patient might be at risk for infection with the SARS-CoV-2 virus that causes COVID-19. Institutional protocols and algorithms that pertain to the evaluation of patients at risk for COVID-19  are in a state of rapid change based on information released by regulatory bodies including the CDC and federal and state organizations. These policies and algorithms were followed during the patient's care in the ED.  Some ED evaluations and interventions may be delayed as a result of limited staffing during the pandemic.*      Medical Decision Making: 53 year old female here with confusion and decreased responsiveness.  CT shows possible new pontine stroke.  Consider possible pontine CVA, well outside of TPA window and no signs of LVO clinically.  Must also consider possible seizure.  She is also significantly hyperglycemic which could be contributing to mild encephalopathy.  Anion gap is 15 and bicarb is only minimally decreased.  Fluids, insulin given.  Admit to medicine  ____________________________________________  FINAL CLINICAL IMPRESSION(S) / ED DIAGNOSES  Final diagnoses:  Acute encephalopathy  Hyperglycemia     MEDICATIONS GIVEN DURING THIS VISIT:  Medications  insulin aspart (novoLOG) injection 0-9 Units (has no administration in time range)  insulin aspart (novoLOG) injection 0-5 Units (has no administration in time range)  0.9 %  sodium chloride infusion (has no administration in time range)  levETIRAcetam (KEPPRA) IVPB 1000 mg/100 mL premix (has no administration in time range)  0.9 %  sodium chloride infusion ( Intravenous New Bag/Given 04/16/19 0935)  insulin aspart (novoLOG) injection 10 Units (10 Units Intravenous Given 04/16/19 1133)     ED Discharge Orders    None       Note:  This document was prepared using Dragon voice recognition software and may include unintentional dictation errors.   Shaune PollackIsaacs, Catalyna Reilly, MD 04/16/19 1513    Shaune PollackIsaacs, Callin Ashe, MD 04/26/19 1340

## 2019-04-16 NOTE — ED Notes (Signed)
Patient's brief and bed linen changed. Purewick placed. When changing patient's clothes and placing her in hospital gown, bedbug was seen and placed in specimen container. Patient's clothes double bagged and contact precautions initiated. MD and charge RN made aware.

## 2019-04-16 NOTE — ED Notes (Signed)
Nurse called MRI for screening prior to scan.

## 2019-04-16 NOTE — ED Triage Notes (Signed)
Patient to ED from home via [facility]. Per EMS, patient's familyh was unable to wake her up this morning. Patient was only responsive to painful stimuli upon EMS arrival. Patient looking around and opening eyes spontaneously upon arrival to ED. VSS for EMS. Patient's CBG 488.

## 2019-04-16 NOTE — ED Notes (Signed)
Pt transported to room 106 by this RN

## 2019-04-16 NOTE — ED Notes (Addendum)
ED TO INPATIENT HANDOFF REPORT  ED Nurse Name and Phone #: Jearld Shineslivia, RN [phone] S Name/Age/Gender [name] 53 y.o. female Room/Bed: ED15A/ED15A  Code Status   Code Status: Prior  Home/SNF/Other Home Patient oriented to: self, place Is this baseline? No   Triage Complete: Triage complete  Chief Complaint loss of consciousness  Triage Note Patient to ED from home via [facility]. Per EMS, patient's familyh was unable to wake her up this morning. Patient was only responsive to painful stimuli upon EMS arrival. Patient looking around and opening eyes spontaneously upon arrival to ED. VSS for EMS. Patient's CBG 488.    Allergies No Known Allergies  Level of Care/Admitting Diagnosis ED Disposition    ED Disposition Condition Comment   Admit  Hospital Area: [facility] [[id number]]  Level of Care: Med-Surg [16]  Covid Evaluation: Confirmed COVID Negative  Diagnosis: Acute CVA (cerebrovascular accident) [facility](HCC) [[id number]][[id number]]  Admitting Physician: Auburn BilberryPATEL, SHREYANG [[id number]]  Attending Physician: Auburn BilberryPATEL, SHREYANG [phone][[id number]]  Estimated length of stay: past midnight tomorrow  Certification:: I certify this patient will need inpatient services for at least 2 midnights  PT Class (Do Not Modify): Inpatient [101]  PT Acc Code (Do Not Modify): Private [1]       B Medical/Surgery History Past Medical History:  Diagnosis Date  . Allergic rhinitis   . Chickenpox   . Depression   . Diabetes (HCC)   . Epilepsy (HCC)   . Headache   . HTN (hypertension)   . Hyperlipidemia   . Seizures (HCC)   . Stroke (HCC)   . Urinary incontinence    Past Surgical History:  Procedure Laterality Date  . CESAREAN SECTION    . INCISION AND DRAINAGE Right 05/13/2016   Procedure: INCISION AND DRAINAGE;  Surgeon: Gwyneth RevelsJustin Fowler, DPM;  Location: [facility] ORS;  Service: Podiatry;  Laterality: Right;     A IV Location/Drains/Wounds Patient Lines/Drains/Airways Status   Active Line/Drains/Airways     Name:   Placement date:   Placement time:   Site:   Days:   Peripheral IV 04/16/19 Left Antecubital   04/16/19    0751    Antecubital   less than 1   External Urinary Catheter   04/16/19    0845    -   less than 1   Incision (Closed) 05/13/16 Foot Right   05/13/16    0838     1068   Wound / Incision (Open or Dehisced) 05/11/16 Diabetic ulcer Foot Right Diabetic ulcer to outer portion of right foot below 5th toe, discoloration noted, no drainage   05/11/16    1800    Foot   1070          Intake/Output Last 24 hours  Intake/Output Summary (Last 24 hours) at 04/16/2019 1505 Last data filed at 04/16/2019 1139 Gross per 24 hour  Intake -  Output 600 ml  Net -600 ml    Labs/Imaging Results for orders placed or performed during the hospital encounter of 04/16/19 (from the past 48 hour(s))  Basic metabolic panel     Status: Abnormal   Collection Time: 04/16/19  7:57 AM  Result Value Ref Range   Sodium 135 135 - 145 mmol/L   Potassium 4.1 3.5 - 5.1 mmol/L   Chloride 99 98 - 111 mmol/L   CO2 21 (L) 22 - 32 mmol/L   Glucose, Bld 514 (HH) 70 - 99 mg/dL    Comment: CRITICAL RESULT CALLED TO, READ BACK BY AND VERIFIED WITH  LAURA CATES AT [id number] 04/16/2019 SDR    BUN 11 6 - 20 mg/dL   Creatinine, Ser 1.191.02 (H) 0.44 - 1.00 mg/dL   Calcium 8.8 (L) 8.9 - 10.3 mg/dL   GFR calc non Af Amer >60 >60 mL/min   GFR calc Af Amer >60 >60 mL/min   Anion gap 15 5 - 15    Comment: Performed at [facility], [address] [place], [place] [address]  CBC     Status: Abnormal   Collection Time: 04/16/19  7:57 AM  Result Value Ref Range   WBC 10.8 (H) 4.0 - 10.5 K/uL   RBC 5.76 (H) 3.87 - 5.11 MIL/uL   Hemoglobin 11.4 (L) 12.0 - 15.0 g/dL   HCT 95.637.4 21.336.0 - 08.646.0 %   MCV 64.9 (L) 80.0 - 100.0 fL   MCH 19.8 (L) 26.0 - 34.0 pg   MCHC 30.5 30.0 - 36.0 g/dL   RDW 57.820.4 (H) 46.911.5 - 62.915.5 %   Platelets 182 150 - 400 K/uL   nRBC 0.0 0.0 - 0.2 %    Comment: Performed at [facility], [address] [place], [place] [address]  Glucose, capillary     Status: Abnormal   Collection Time: 04/16/19  8:01 AM  Result Value Ref Range   Glucose-Capillary 509 (HH) 70 - 99 mg/dL   Comment 1 Document in Chart   Valproic acid level     Status: Abnormal   Collection Time: 04/16/19  8:08 AM  Result Value Ref Range   Valproic Acid Lvl <10 (L) 50.0 - 100.0 ug/mL    Comment: Performed at [facility], [address] [place], [place] [address]  SARS Coronavirus 2 (CEPHEID - Performed in [facility] hospital lab), Hosp Order     Status: None   Collection Time: 04/16/19  9:35 AM   Specimen: Nasopharyngeal Swab  Result Value Ref Range   SARS Coronavirus 2 NEGATIVE NEGATIVE    Comment: (NOTE) If result is NEGATIVE SARS-CoV-2 target nucleic acids are NOT DETECTED. The SARS-CoV-2 RNA is generally detectable in upper and lower  respiratory specimens during the acute phase of infection. The lowest  concentration of SARS-CoV-2 viral copies this assay can detect is 250  copies / mL. A negative result does not preclude SARS-CoV-2 infection  and should not be used as the sole basis for treatment or other  patient management decisions.  A negative result may occur with  improper specimen collection / handling, submission of specimen other  than nasopharyngeal swab, presence of viral mutation(s) within the  areas targeted by this assay, and inadequate number of viral copies  (<250 copies / mL). A negative result must be combined with clinical  observations, patient history, and epidemiological information. If result is POSITIVE SARS-CoV-2 target nucleic acids are DETECTED. The SARS-CoV-2 RNA is generally detectable in upper and lower  respiratory specimens dur ing the acute phase of infection.  Positive  results are indicative of active infection with SARS-CoV-2.  Clinical  correlation with patient history and other diagnostic information is  necessary to determine patient infection  status.  Positive results do  not rule out bacterial infection or co-infection with other viruses. If result is PRESUMPTIVE POSTIVE SARS-CoV-2 nucleic acids MAY BE PRESENT.   A presumptive positive result was obtained on the submitted specimen  and confirmed on repeat testing.  While 2019 novel coronavirus  (SARS-CoV-2) nucleic acids may be present in the submitted sample  additional confirmatory testing may be  necessary for epidemiological  and / or clinical management purposes  to differentiate between  SARS-CoV-2 and other Sarbecovirus currently known to infect humans.  If clinically indicated additional testing with an alternate test  methodology [phone]) is advised. The SARS-CoV-2 RNA is generally  detectable in upper and lower respiratory sp ecimens during the acute  phase of infection. The expected result is Negative. Fact Sheet for Patients:  StrictlyIdeas.no Fact Sheet for Healthcare Providers: BankingDealers.co.za This test is not yet approved or cleared by the [place] FDA and has been authorized for detection and/or diagnosis of SARS-CoV-2 by FDA under an Emergency Use Authorization (EUA).  This EUA will remain in effect (meaning this test can be used) for the duration of the COVID-19 declaration under Section 564(b)(1) of the Act, 21 U.S.C. section 360bbb-3(b)(1), unless the authorization is terminated or revoked sooner. Performed at [facility], [address] [place], [place] [address]   Urinalysis, Complete w Microscopic     Status: Abnormal   Collection Time: 04/16/19 11:35 AM  Result Value Ref Range   Color, Urine STRAW (A) YELLOW   APPearance CLEAR (A) CLEAR   Specific Gravity, Urine 1.028 1.005 - 1.030   pH 7.0 5.0 - 8.0   Glucose, UA >=500 (A) NEGATIVE mg/dL   Hgb urine dipstick NEGATIVE NEGATIVE   Bilirubin Urine NEGATIVE NEGATIVE   Ketones, ur 20 (A) NEGATIVE mg/dL   Protein, ur NEGATIVE  NEGATIVE mg/dL   Nitrite NEGATIVE NEGATIVE   Leukocytes,Ua NEGATIVE NEGATIVE   RBC / HPF 0-5 0 - 5 RBC/hpf   WBC, UA 0-5 0 - 5 WBC/hpf   Bacteria, UA NONE SEEN NONE SEEN   Squamous Epithelial / LPF 0-5 0 - 5    Comment: Performed at [facility], [address] [place], [place] [address]  Glucose, capillary     Status: Abnormal   Collection Time: 04/16/19 12:53 PM  Result Value Ref Range   Glucose-Capillary 281 (H) 70 - 99 mg/dL   Comment 1 Document in Chart    Ct Head Wo Contrast  Result Date: 04/16/2019 CLINICAL DATA:  53 year old female with acute confusion, poorly arousable this morning. EXAM: CT HEAD WITHOUT CONTRAST TECHNIQUE: Contiguous axial images were obtained from the base of the skull through the vertex without intravenous contrast. COMPARISON:  Brain MRI 08/14/2016 and earlier. FINDINGS: Brain: Chronic right hemisphere encephalomalacia, primarily in the right MCA territory but also involving the thalamus is stable since 2017. New hypodensity in the brainstem since 2017, bilateral but greater on the left (series 2, image 9). No midline shift, ventriculomegaly, mass effect, evidence of mass lesion, intracranial hemorrhage or evidence of cortically based acute infarction. Vascular: Calcified atherosclerosis at the skull base. No suspicious intracranial vascular hyperdensity. Skull: No acute osseous abnormality identified. Sinuses/Orbits: Visualized paranasal sinuses and mastoids are stable and well pneumatized. Other: Similar Disconjugate gaze. Visualized scalp soft tissues are within normal limits. IMPRESSION: 1. 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 2. Chronic right hemisphere encephalomalacia is stable. No acute intracranial hemorrhage. Electronically Signed   By: Genevie Ann M.D.   On: 04/16/2019 09:41   Dg Chest Portable 1 View  Result Date: 04/16/2019 CLINICAL DATA:  Altered mental status EXAM: PORTABLE CHEST  1 VIEW COMPARISON:  Chest radiograph 05/12/2016 FINDINGS: Monitoring leads overlie the patient. Stable cardiomegaly. Bibasilar atelectasis. No large area of pulmonary consolidation. No pleural effusion or pneumothorax. IMPRESSION: No acute cardiopulmonary process. Cardiomegaly. Electronically Signed   By:  Annia Beltrew  Davis M.D.   On: 04/16/2019 09:28    Pending Labs Unresulted Labs (From admission, onward)    Start     Ordered   04/16/19 1438  Hemoglobin A1c  Once,   STAT    Comments: To assess prior glycemic control    04/16/19 1437   Signed and Held  Hemoglobin A1c  Tomorrow morning,   R     Signed and Held   Signed and Held  Lipid panel  Tomorrow morning,   R    Comments: Fasting    Signed and Held   Signed and Held  CBC  (enoxaparin (LOVENOX)    CrCl >/= 30 ml/min)  Once,   R    Comments: Baseline for enoxaparin therapy IF NOT ALREADY DRAWN.  Notify MD if PLT < 100 K.    Signed and Held   Signed and Held  Creatinine, serum  (enoxaparin (LOVENOX)    CrCl >/= 30 ml/min)  Once,   R    Comments: Baseline for enoxaparin therapy IF NOT ALREADY DRAWN.    Signed and Held   Signed and Held  Creatinine, serum  (enoxaparin (LOVENOX)    CrCl >/= 30 ml/min)  Weekly,   R    Comments: while on enoxaparin therapy    Signed and Held          Vitals/Pain Today's Vitals   04/16/19 0755 04/16/19 0800 04/16/19 1257 04/16/19 1330  BP: (!) 160/86 (!) 162/88 (!) 163/125 (!) 189/106  Pulse: (!) 105 (!) 108 (!) 105   Resp: (!) 22 (!) 36 (!) 22   Temp: 98.2 F (36.8 C)     TempSrc: Oral     SpO2: 95% 93% 98%   Weight:      Height:        Isolation Precautions No active isolations  Medications Medications  insulin aspart (novoLOG) injection 0-9 Units (has no administration in time range)  insulin aspart (novoLOG) injection 0-5 Units (has no administration in time range)  0.9 %  sodium chloride infusion (has no administration in time range)  levETIRAcetam (KEPPRA) IVPB 1000 mg/100 mL premix  (has no administration in time range)  0.9 %  sodium chloride infusion ( Intravenous New Bag/Given 04/16/19 0935)  insulin aspart (novoLOG) injection 10 Units (10 Units Intravenous Given 04/16/19 1133)    Mobility walks with person assist High fall risk   Focused Assessments Neuro Assessment Handoff:  Swallow screen pass? Pt has not attempted due to confusion.     Last date known well: 04/15/19   Neuro Assessment: Exceptions to WDL Neuro Checks:      Last Documented NIHSS Modified Score:   Has TPA been given? No If patient is a Neuro Trauma and patient is going to OR before floor call report to 4N Charge nurse: [phone] or [phone]     R Recommendations: See Admitting Provider Note  Report given to:   Additional Notes:  Pt was found this morning unable to wake up. Increased drowsiness per her husband. Pt now awakens easily to voice but still remains confused. Pt has hx of right sided stroke. New infarct noted on CT scan to her brain stem. Pt tends to lean to the left when sitting/favoring that side. BG was elevated but has come down appropriately with fluids and insulin IV dose.

## 2019-04-17 ENCOUNTER — Inpatient Hospital Stay (HOSPITAL_COMMUNITY)
Admit: 2019-04-17 | Discharge: 2019-04-17 | Disposition: A | Discharge status: Home / Self Care | Payer: BC Managed Care – PPO | Attending: Internal Medicine | Admitting: Internal Medicine

## 2019-04-17 DIAGNOSIS — G9340 Encephalopathy, unspecified: Principal | ICD-10-CM

## 2019-04-17 DIAGNOSIS — I361 Nonrheumatic tricuspid (valve) insufficiency: Secondary | ICD-10-CM

## 2019-04-17 LAB — LIPID PANEL
Cholesterol: 214 mg/dL — ABNORMAL HIGH (ref 0–200)
HDL: 51 mg/dL (ref >40)
LDL Cholesterol: 135 mg/dL — ABNORMAL HIGH (ref 0–99)
Total CHOL/HDL Ratio: 4.2 RATIO
Triglycerides: 142 mg/dL (ref <150)
VLDL: 28 mg/dL (ref 0–40)

## 2019-04-17 LAB — ECHOCARDIOGRAM COMPLETE
Height: 62 in
Weight: 3901.26 oz

## 2019-04-17 LAB — GLUCOSE, CAPILLARY
Glucose-Capillary: 219 mg/dL — ABNORMAL HIGH (ref 70–99)
Glucose-Capillary: 269 mg/dL — ABNORMAL HIGH (ref 70–99)

## 2019-04-17 LAB — HEMOGLOBIN A1C
Hgb A1c MFr Bld: 11.3 % — ABNORMAL HIGH (ref 4.8–5.6)
Mean Plasma Glucose: 277.61 mg/dL

## 2019-04-17 MED ORDER — ENOXAPARIN SODIUM 40 MG/0.4ML ~~LOC~~ SOLN
40.0000 mg | Freq: Two times a day (BID) | SUBCUTANEOUS | Status: DC
  Administered 2019-04-17: 40 mg via SUBCUTANEOUS
  Filled 2019-04-17: qty 0.4

## 2019-04-17 NOTE — Progress Notes (Addendum)
Inpatient Diabetes Program Recommendations  AACE/ADA: New Consensus Statement on Inpatient Glycemic Control (2015)  Target Ranges:  Prepandial:   less than 140 mg/dL      Peak postprandial:   less than 180 mg/dL (1-2 hours)      Critically ill patients:  140 - 180 mg/dL   Results for [name], [name] (MRN [id number]) as of 04/17/2019 07:58  Ref. Range 04/16/2019 08:01 04/16/2019 12:53 04/16/2019 16:25 04/16/2019 21:39 04/17/2019 07:46  Glucose-Capillary Latest Ref Range: 70 - 99 mg/dL 509 (HH)  10 units NOVOLOG given at 11:33am 281 (H) 307 (H)  17 units NOVOLOG  226 (H)  5 units NOVOLOG +  40 units LEVEMIR 219 (H)  13 units NOVOLOG    Results for [name], [name] (MRN [id number]) as of 04/17/2019 07:58  Ref. Range 05/12/2016 04:12 08/15/2016 03:47 04/16/2019 07:57  Hemoglobin A1C Latest Ref Range: 4.8 - 5.6 % 13.6 (H) 13.3 (H) 11.5 (H)  (283 mg/dl)     Admit: AMS/ Acute [place] CVA  History: DM, CVA  Home DM Meds: Levemir 40 units QHS (55 units QHS per Husband)       Novolog 10 units TID with meals (15 units TID per husband)       Metformin 500 mg BID       Glipizide 5 mg Daily   Current Orders: Levemir 40 units QHS     Novolog Sensitive Correction Scale/ SSI (0-9 units) TID AC + HS     Novolog 10 units TID with meals    PCP: [place]      MD- Please consider the following in-hospital insulin adjustments:  1. Increase Levemir slightly to 45 units QHS (CBG still elevated this AM)  2. Increase Novolog Meal Coverage to Novolog 15 units TID with meals (home dose per pt's husband)     Current A1c of 11.5% shows continued poor glucose control at home--Last few A1c levels on file back in 2017 were 13.6% and 13.3%.  Needs better CBG control at home especially in light of new CVA.      Addendum 10:45am- Called pt's husband [name] at home to discuss pt's current CBGs and home DM care regimen.  [name] told me he helps pt with all medications.   He has a pill box for patient and will dial up the correct dose of insulin for the pt on the insulin pen and then allows the patient to give her own injection.  Told me that pt's Levemir dose was recently increased to 55 units QHS and that Novolog dose was increased to 15 units TID with meals.  Explained to [name] that pt's current A1c is 11.5%.  Explained what an A1c is and what it measures.  Discussed with [name] that pt's A1c should ideally be closer to 7-8% to help prevent further complications including cardiovascular events like MIs and CVAs.  [name] told me that pt's CBGs are often very variable at home and that he often sees high CBGs.  [name] stated to me that the insulins were increased at the last PCP visit.  Encouraged frequent CBG checks (at least TID ac meals) at home and also encouraged pt's husband to contact PCP office if pt continues to have consistently elevated CBGs >200 mg/dl at home.  Pt's husband told me they have meds at home and have no problems getting meds and CBG meter supplies.    --Will follow patient during hospitalization--  Wyn Quaker RN,  MSN, CDE Diabetes Coordinator Inpatient Glycemic Control Team Team Pager: [phone] (8a-5p)

## 2019-04-17 NOTE — Discharge Instructions (Signed)
Blood Glucose Monitoring, Adult °Monitoring your blood sugar (glucose) is an important part of managing your diabetes (diabetes mellitus). Blood glucose monitoring involves checking your blood glucose as often as directed and keeping a record (log) of your results over time. °Checking your blood glucose regularly and keeping a blood glucose log can: °· Help you and your health care provider adjust your diabetes management plan as needed, including your medicines or insulin. °· Help you understand how food, exercise, illnesses, and medicines affect your blood glucose. °· Let you know what your blood glucose is at any time. You can quickly find out if you have low blood glucose (hypoglycemia) or high blood glucose (hyperglycemia). °Your health care provider will set individualized treatment goals for you. Your goals will be based on your age, other medical conditions you have, and how you respond to diabetes treatment. Generally, the goal of treatment is to maintain the following blood glucose levels: °· Before meals (preprandial): 80-130 mg/dL (4.4-7.2 mmol/L). °· After meals (postprandial): below 180 mg/dL (10 mmol/L). °· A1c level: less than 7%. °Supplies needed: °· Blood glucose meter. °· Test strips for your meter. Each meter has its own strips. You must use the strips that came with your meter. °· A needle to prick your finger (lancet). Do not use a lancet more than one time. °· A device that holds the lancet (lancing device). °· A journal or log book to write down your results. °How to check your blood glucose ° °1. Wash your hands with soap and water. °2. Prick the side of your finger (not the tip) with the lancet. Use a different finger each time. °3. Gently rub the finger until a small drop of blood appears. °4. Follow instructions that come with your meter for inserting the test strip, applying blood to the strip, and using your blood glucose meter. °5. Write down your result and any notes. °Some meters  allow you to use areas of your body other than your finger (alternative sites) to test your blood. The most common alternative sites are: °· Forearm. °· Thigh. °· Palm of the hand. °If you think you may have hypoglycemia, or if you have a history of not knowing when your blood glucose is getting low (hypoglycemia unawareness), do not use alternative sites. Use your finger instead. Alternative sites may not be as accurate as the fingers, because blood flow is slower in these areas. This means that the result you get may be delayed, and it may be different from the result that you would get from your finger. °Follow these instructions at home: °Blood glucose log ° °· Every time you check your blood glucose, write down your result. Also write down any notes about things that may be affecting your blood glucose, such as your diet and exercise for the day. This information can help you and your health care provider: °? Look for patterns in your blood glucose over time. °? Adjust your diabetes management plan as needed. °· Check if your meter allows you to download your records to a computer. Most glucose meters store a record of glucose readings in the meter. °If you have type 1 diabetes: °· Check your blood glucose 2 or more times a day. °· Also check your blood glucose: °? Before every insulin injection. °? Before and after exercise. °? Before meals. °? 2 hours after a meal. °? Occasionally between 2:00 a.m. and 3:00 a.m., as directed. °? Before potentially dangerous tasks, like driving or using heavy machinery. °?   At bedtime.  You may need to check your blood glucose more often, up to 6-10 times a day, if you: ? Use an insulin pump. ? Need multiple daily injections (MDI). ? Have diabetes that is not well-controlled. ? Are ill. ? Have a history of severe hypoglycemia. ? Have hypoglycemia unawareness. If you have type 2 diabetes:  If you take insulin or other diabetes medicines, check your blood glucose 2 or  more times a day.  If you are on intensive insulin therapy, check your blood glucose 4 or more times a day. Occasionally, you may also need to check between 2:00 a.m. and 3:00 a.m., as directed.  Also check your blood glucose: ? Before and after exercise. ? Before potentially dangerous tasks, like driving or using heavy machinery.  You may need to check your blood glucose more often if: ? Your medicine is being adjusted. ? Your diabetes is not well-controlled. ? You are ill. General tips  Always keep your supplies with you.  If you have questions or need help, all blood glucose meters have a 24-hour "hotline" phone number that you can call. You may also contact your health care provider.  After you use a few boxes of test strips, adjust (calibrate) your blood glucose meter by following instructions that came with your meter. Contact a health care provider if:  Your blood glucose is at or above 240 mg/dL (13.3 mmol/L) for 2 days in a row.  You have been sick or have had a fever for 2 days or longer, and you are not getting better.  You have any of the following problems for more than 6 hours: ? You cannot eat or drink. ? You have nausea or vomiting. ? You have diarrhea. Get help right away if:  Your blood glucose is lower than 54 mg/dL (3 mmol/L).  You become confused or you have trouble thinking clearly.  You have difficulty breathing.  You have moderate or large ketone levels in your urine. Summary  Monitoring your blood sugar (glucose) is an important part of managing your diabetes (diabetes mellitus).  Blood glucose monitoring involves checking your blood glucose as often as directed and keeping a record (log) of your results over time.  Your health care provider will set individualized treatment goals for you. Your goals will be based on your age, other medical conditions you have, and how you respond to diabetes treatment.  Every time you check your blood glucose,  write down your result. Also write down any notes about things that may be affecting your blood glucose, such as your diet and exercise for the day. This information is not intended to replace advice given to you by your health care provider. Make sure you discuss any questions you have with your health care provider. Document Released: 09/06/2003 Document Revised: 06/27/2018 Document Reviewed: 02/13/2016 Elsevier Patient Education  2020 [place].   Hyperglycemia Hyperglycemia is when the sugar (glucose) level in your blood is too high. It may not cause symptoms. If you do have symptoms, they may include warning signs, such as:  Feeling more thirsty than normal.  Hunger.  Feeling tired.  Needing to pee (urinate) more than normal.  Blurry eyesight (vision). You may get other symptoms as it gets worse, such as:  Dry mouth.  Not being hungry (loss of appetite).  Fruity-smelling breath.  Weakness.  Weight gain or loss that is not planned. Weight loss may be fast.  A tingling or numb feeling in your hands or  feet.  Headache.  Skin that does not bounce back quickly when it is lightly pinched and released (poor skin turgor).  Pain in your belly (abdomen).  Cuts or bruises that heal slowly. High blood sugar can happen to people who do or do not have diabetes. High blood sugar can happen slowly or quickly, and it can be an emergency. Follow these instructions at home: General instructions  Take over-the-counter and prescription medicines only as told by your doctor.  Do not use products that contain nicotine or tobacco, such as cigarettes and e-cigarettes. If you need help quitting, ask your doctor.  Limit alcohol intake to no more than 1 drink per day for nonpregnant women and 2 drinks per day for men. One drink equals 12 oz of beer, 5 oz of wine, or 1 oz of hard liquor.  Manage stress. If you need help with this, ask your doctor.  Keep all follow-up visits as told by  your doctor. This is important. Eating and drinking   Stay at a healthy weight.  Exercise regularly, as told by your doctor.  Drink enough fluid, especially when you: ? Exercise. ? Get sick. ? Are in hot temperatures.  Eat healthy foods, such as: ? Low-fat (lean) proteins. ? Complex carbs (complex carbohydrates), such as whole wheat bread or brown rice. ? Fresh fruits and vegetables. ? Low-fat dairy products. ? Healthy fats.  Drink enough fluid to keep your pee (urine) clear or pale yellow. If you have diabetes:   Make sure you know the symptoms of hyperglycemia.  Follow your diabetes management plan, as told by your doctor. Make sure you: ? Take insulin and medicines as told. ? Follow your exercise plan. ? Follow your meal plan. Eat on time. Do not skip meals. ? Check your blood sugar as often as told. Make sure to check before and after exercise. If you exercise longer or in a different way than you normally do, check your blood sugar more often. ? Follow your sick day plan whenever you cannot eat or drink normally. Make this plan ahead of time with your doctor.  Share your diabetes management plan with people in your workplace, school, and household.  Check your urine for ketones when you are ill and as told by your doctor.  Carry a card or wear jewelry that says that you have diabetes. Contact a doctor if:  Your blood sugar level is higher than 240 mg/dL (13.3 mmol/L) for 2 days in a row.  You have problems keeping your blood sugar in your target range.  High blood sugar happens often for you. Get help right away if:  You have trouble breathing.  You have a change in how you think, feel, or act (mental status).  You feel sick to your stomach (nauseous), and that feeling does not go away.  You cannot stop throwing up (vomiting). These symptoms may be an emergency. Do not wait to see if the symptoms will go away. Get medical help right away. Call your local  emergency services (911 in the [place] Do not drive yourself to the hospital. Summary  Hyperglycemia is when the sugar (glucose) level in your blood is too high.  High blood sugar can happen to people who do or do not have diabetes.  Make sure you drink enough fluids, eat healthy foods, and exercise regularly.  Contact your doctor if you have problems keeping your blood sugar in your target range. This information is not intended to replace advice given  to you by your health care provider. Make sure you discuss any questions you have with your health care provider. Document Released: 07/01/2009 Document Revised: 05/21/2016 Document Reviewed: 05/21/2016 Elsevier Patient Education  2020 [place].   Subcutaneous Injection Instructions Using a Prefilled Syringe A subcutaneous injection is a shot of medicine that is given into the layer of fat and tissue between skin and muscle. The injection is given with a single-use syringe that is already filled with medicine (prefilled syringe). Read the medicine guide or package insert that came with the syringe. Follow directions from the guide about how to prepare and give the injection. This is important because the directions may be different for each medicine. Use only the syringe, needle, and medicine that your health care provider prescribes. Use each prefilled syringe and needle only one time. Supplies needed:  Prefilled syringe with needle. Use the needle length and size (gauge) that your health care provider or pharmacist gives to you.  Alcohol wipes.  Bandage.  A container for syringe disposal. This may be a puncture-proof sharps container or a hard-sided plastic container that has a secure lid, such as an empty laundry detergent bottle. How to choose a site for injection Follow instructions from your health care provider about where to give an injection. Do not inject in the same spot each time. There are five main areas that can be used  for injecting. These areas include:  Abdomen. Avoid the area that is within 2 inches (5 cm) of your navel (umbilicus).  Front of thigh.  Upper, outer side of thigh.  Upper, outer side of arm.  Upper, outer part of buttock. How to give an injection using a prefilled syringe  1. Wash your hands with soap and water. If soap and water are not available, use hand sanitizer. 2. Use an alcohol wipe to clean the site where you will be injecting the needle. Let the site air-dry. 3. Remove the plastic cover from the needle on the syringe. Do not let the needle touch anything. 4. Hold the syringe with the needle pointing up. Check the syringe for any remaining air bubbles. If there are air bubbles, flick the syringe with your finger until the air bubbles rise to the top. Then, gently push on the plunger until you can see a drop of medicine appear at the tip of the needle. This will clear any remaining air bubbles from the syringe. 5. Hold the syringe in your writing hand like a pencil. 6. Use your other hand to pinch and hold about an inch (2.5 cm) of skin. Do not directly touch the cleaned part of the skin. 7. Insert the entire needle straight into the fold of skin. The needle should be at a 90-degree angle (perpendicular) to the skin. Push the needle all the way against the skin. The needle may need to be injected at a 45-degree angle in thin adults or children who have a small amount of body fat. 8. After the needle is completely inserted into the skin, release the skin that you are pinching. Continue to hold the syringe with your writing hand. 9. Use your thumb or index finger of your writing hand to push the plunger all the way into the syringe to inject the medicine. 10. Pull the needle straight out of the skin. 11. Press and hold the alcohol wipe over the injection site until bleeding stops. Do not rub the area. 12. Cover the injection site with a bandage, if needed. How to  safely throw away the  supplies If you are using a syringe that does not have a safety system for shielding the needle after injection:  Do not recap the needle. Place the syringe and needle in the disposal container. If your syringe has a safety system for shielding the needle after injection:  Firmly push down on the plunger after you complete the injection. The protective sleeve will automatically cover the needle, and you will hear a click. The click means that the needle is safely covered. Follow the disposal regulations for the area where you live. Do not use any syringe or needle more than one time. Contact a health care provider if:  You have difficulty giving the injection.  You think that the injection was not given correctly.  You have difficulty with any of the supplies.  The medicine causes side effects.  Rashes develop on the skin.  A fever develops.  The condition that is being treated gets worse. Get help right away if: Any of these symptoms develop after the injection is given:  Difficulty breathing.  Chest pain.  A rash over most or all of the body.  Swelling of the lips or tongue.  Difficulty swallowing. Summary  A subcutaneous injection is a shot of medicine that is given into the layer of fat and tissue between skin and muscle.  Read the medicine guide or package insert that came with the syringe. Follow directions from the guide about how to prepare and give the injection.  Follow instructions from your health care provider about where to give an injection.  Contact a health care provider if you cannot give an injection, you think you gave it incorrectly, or you develop any side effects of the medicine.  Get help right away if any of these develop after an injection: difficulty breathing, chest pain, rash on the body, swelling of the lips or tongue, or difficulty swallowing. This information is not intended to replace advice given to you by your health care provider. Make  sure you discuss any questions you have with your health care provider. Document Released: 05/17/2011 Document Revised: 12/25/2018 Document Reviewed: 06/04/2018 Elsevier Patient Education  2020 [employer]

## 2019-04-17 NOTE — TOC Initial Note (Signed)
Transition of Care [facility]) - Initial/Assessment Note    Patient Details  Name: [name] MRN: [id number] Date of Birth: [birth year]  Transition of Care [facility]) CM/SW Contact:    Shelbie Hutching, RN Phone Number: 04/17/2019, 9:48 AM  Clinical Narrative:                 Patient admitted for acute CVA.  Patient has had a stroke in the past with residual deficits.  Patient is from home with her husband, Hinton Dyer.  Hinton Dyer is the patient's primary caregiver, he reports that she can walk around the home by herself and feed herself but she needs assistance with everything else.  Patient has a walker and cane.  Patient and husband are very interested in home health services, they have used [facility] in the past and would like to use them again.  Referral sent to [facility] with Alvis Lemmings for RN, PT, and aide.  PCP is current, no problems obtaining medications.  Husband will pick up patient when ready for discharge.   Expected Discharge Plan: [place] Barriers to Discharge: Continued Medical Work up   Patient Goals and CMS Choice   CMS Medicare.gov Compare Post Acute Care list provided to:: Patient Represenative (must comment)(Husband Hinton Dyer) Choice offered to / list presented to : Spouse  Expected Discharge Plan and Services Expected Discharge Plan: [place]   Discharge Planning Services: CM Consult Post Acute Care Choice: [place] arrangements for the past 2 months: Single Family Home Expected Discharge Date: 04/18/19                         HH Arranged: RN, PT, Nurse's Aide [place] Agency: [place] Date [facility] Agency Contacted: 04/17/19 Time HH Agency Contacted: [phone] Representative spoke with at [place]: Tommi Rumps  Prior Living Arrangements/Services Living arrangements for the past 2 months: [place] Lives with:: Spouse Patient language and need for interpreter reviewed:: No Do you feel safe going back to the place where you live?: Yes       Need for Family Participation in Patient Care: Yes (Comment)(chronic medical conditions) Care giver support system in place?: Yes (comment) Current home services: DME(walker and cane) Criminal Activity/Legal Involvement Pertinent to Current Situation/Hospitalization: No - Comment as needed  Activities of Daily Living Home Assistive Devices/Equipment: Other (Comment)(unknown, uta) ADL Screening (condition at time of admission) Patient's cognitive ability adequate to safely complete daily activities?: No Is the patient deaf or have difficulty hearing?: No Does the patient have difficulty seeing, even when wearing glasses/contacts?: No Does the patient have difficulty concentrating, remembering, or making decisions?: Yes Patient able to express need for assistance with ADLs?: No Does the patient have difficulty dressing or bathing?: Yes Independently performs ADLs?: No Communication: Independent Dressing (OT): Needs assistance Grooming: Needs assistance Feeding: Needs assistance Bathing: Needs assistance Toileting: Needs assistance In/Out Bed: Needs assistance Walks in Home: Needs assistance Does the patient have difficulty walking or climbing stairs?: No Weakness of Legs: None Weakness of Arms/Hands: None  Permission Sought/Granted Permission sought to share information with : Case Manager, Family Supports, Other (comment) Permission granted to share information with : Yes, Verbal Permission Granted     Permission granted to share info w AGENCY: [place] granted to share info w Relationship: Husband     Emotional Assessment       Orientation: : Oriented to Self, Oriented to Place, Oriented to  Time Alcohol /  Substance Use: Not Applicable Psych Involvement: No (comment)  Admission diagnosis:  Hyperglycemia [R73.9] Acute encephalopathy [G93.40] Patient Active Problem List   Diagnosis Date Noted  . Cerebral infarction (HCC) 08/14/2016  . Acute CVA  (cerebrovascular accident) (HCC) 08/14/2016  . Delirium due to another medical condition 08/13/2016  . Urinary tract infection 08/13/2016  . Right foot infection 05/11/2016  . Foot abscess, right 05/11/2016  . Anxiety and depression 12/02/2015  . CVA (cerebral infarction) 11/27/2015  . Epilepsy (HCC) 06/17/2015  . 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 Headache 05/19/2015  . Weakness of left side of body 05/19/2015  . History of stroke 05/19/2015  . Severe obesity (BMI >= 40) (HCC) 03/19/2014  . HTN (hypertension)   . Diabetes (HCC)   . Hyperlipidemia   . [name] (transient ischemic attack) 03/17/2014   PCP:  [facility]:   [facility] [address] - [place] - [address] [place] [id number] Phone: [phone] Fax: [phone]     Social Determinants of Health (SDOH) Interventions    Readmission Risk Interventions No flowsheet data found.

## 2019-04-17 NOTE — Progress Notes (Signed)
PHARMACIST - PHYSICIAN COMMUNICATION  CONCERNING:  Enoxaparin (Lovenox) for DVT Prophylaxis    RECOMMENDATION: Patient was prescribed enoxaparin 40mg  q24 hours for VTE prophylaxis.   Filed Weights   04/16/19 0754 04/16/19 1615  Weight: 252 lb (114.3 kg) 243 lb 13.3 oz (110.6 kg)    Body mass index is 44.6 kg/m.  Estimated Creatinine Clearance: 74.8 mL/min (A) (by C-G formula based on SCr of 1.02 mg/dL (H)).   Based on [place] patient is candidate for enoxaparin 40mg  every 12 hour dosing due to BMI being >40.    DESCRIPTION: Pharmacy has adjusted enoxaparin dose per [facility] policy.  Patient is now receiving enoxaparin 40mg  every 12 hours.    Tawnya Crook, PharmD Clinical Pharmacist  04/17/2019 8:08 AM

## 2019-04-17 NOTE — Discharge Summary (Signed)
[facility] NAME: [name]    MR#:  [id number]  DATE OF BIRTH:  [birth year]  DATE OF ADMISSION:  04/16/2019 ADMITTING PHYSICIAN: Dustin Flock, MD  DATE OF DISCHARGE: 04/17/2019  PRIMARY CARE PHYSICIAN: [facility]    ADMISSION DIAGNOSIS:  Hyperglycemia [R73.9] Acute encephalopathy [G93.40]  DISCHARGE DIAGNOSIS:  Active Problems:   Acute CVA (cerebrovascular accident) ([place])   SECONDARY DIAGNOSIS:   Past Medical History:  Diagnosis Date  . Allergic rhinitis   . Chickenpox   . Depression   . Diabetes ([place])   . Epilepsy ([place])   . Headache   . HTN (hypertension)   . Hyperlipidemia   . Seizures ([place])   . Stroke ([place])   . Urinary incontinence     HOSPITAL COURSE:   53 year old female with past medical history of previous CVA, seizures, hypertension, hyperlipidemia, previous history epilepsy, diabetes, depression, urinary incontinence who presented to the hospital due to altered mental status.  1.  Altered mental status- initially suspected to be secondary to a combination of underlying stroke and possible seizure. - Patient observed overnight in the hospital, underwent a CT of the head which was suspicious for stroke but MRI did not show any evidence of stroke.  Patient was seen by neurology and they did not recommend any further intervention or any further imaging. -Mental status is back to baseline.  2.  Suspected seizures-now ruled out.  Patient was observed overnight.  Had no further seizure activity.  Seen by neurology who did not recommend any further imaging or EEG.  Patient is already on Depakote and a level was subtherapeutic likely secondary to noncompliance.  Further follow-up as an outpatient.  3.  Uncontrolled diabetes with hyperglycemia-patient's A1c is greater than 11. - Patient is likely noncompliant with her insulin. -No evidence of DKA, patient will continue her Levemir, aspart  with meals.  Further changes can be done as an outpatient.  Home health nursing was arranged for her to help manage her diabetes.  4.  Essential hypertension-patient will continue her lisinopril/HCTZ, metoprolol upon discharge.  5.  Diabetes type 2 without complication-patient resume her metformin and glipizide upon discharge.  6.  Hyperlipidemia-patient will continue atorvastatin.  DISCHARGE CONDITIONS:   Stable  CONSULTS OBTAINED:  Treatment Team:  Leotis Pain, MD  DRUG ALLERGIES:  No Known Allergies  DISCHARGE MEDICATIONS:   Allergies as of 04/17/2019   No Known Allergies     Medication List    TAKE these medications   aspirin 325 MG EC tablet Take 1 tablet (325 mg total) by mouth daily. Notes to patient: 04/18/19   atorvastatin 40 MG tablet Commonly known as: LIPITOR Take 1 tablet (40 mg total) by mouth daily at 6 PM. Notes to patient: 04/17/19   divalproex 500 MG 24 hr tablet Commonly known as: DEPAKOTE ER Take 1 tablet (500 mg total) by mouth daily. Notes to patient: 04/18/19   etodolac 400 MG tablet Commonly known as: LODINE Take 400 mg by mouth daily as needed.   glipiZIDE 5 MG 24 hr tablet Commonly known as: GLUCOTROL XL Take 1 tablet (5 mg total) by mouth daily. Notes to patient: 04/17/19   insulin aspart 100 UNIT/ML FlexPen Commonly known as: NovoLOG FlexPen Inject 10 Units into the skin 3 (three) times daily with meals. Notes to patient: 04/17/19   Insulin Detemir 100 UNIT/ML Pen Commonly known as: Levemir FlexPen Inject 40 Units into the skin daily at 10  pm. Notes to patient: 04/17/19   lisinopril-hydrochlorothiazide 20-12.5 MG tablet Commonly known as: ZESTORETIC Take 1 tablet by mouth daily. Notes to patient: 04/17/19   metFORMIN 500 MG tablet Commonly known as: GLUCOPHAGE Take 1 tablet (500 mg total) by mouth 2 (two) times daily with a meal. Notes to patient: 04/17/19   metoprolol tartrate 25 MG tablet Commonly known as:  LOPRESSOR Take 25 mg by mouth 2 (two) times a day. Notes to patient: 04/17/19         DISCHARGE INSTRUCTIONS:   DIET:  Cardiac diet and Diabetic diet  DISCHARGE CONDITION:  Stable  ACTIVITY:  Activity as tolerated  OXYGEN:  Home Oxygen: No.   Oxygen Delivery: room air  DISCHARGE LOCATION:  Home with Home Health PT, RN, OT, Aide, Social Work.    If you experience worsening of your admission symptoms, develop shortness of breath, life threatening emergency, suicidal or homicidal thoughts you must seek medical attention immediately by calling 911 or calling your MD immediately  if symptoms less severe.  You Must read complete instructions/literature along with all the possible adverse reactions/side effects for all the Medicines you take and that have been prescribed to you. Take any new Medicines after you have completely understood and accpet all the possible adverse reactions/side effects.   Please note  You were cared for by a hospitalist during your hospital stay. If you have any questions about your discharge medications or the care you received while you were in the hospital after you are discharged, you can call the unit and asked to speak with the hospitalist on call if the hospitalist that took care of you is not available. Once you are discharged, your primary care physician will handle any further medical issues. Please note that NO REFILLS for any discharge medications will be authorized once you are discharged, as it is imperative that you return to your primary care physician (or establish a relationship with a primary care physician if you do not have one) for your aftercare needs so that they can reassess your need for medications and monitor your lab values.     Today   No acute events overnight, mental status back to baseline.  Patient is ambulating well.  Seen by neurology and they do not recommend any further imaging.  Will discharge home today with home  health services.  VITAL SIGNS:  Blood pressure (!) 143/79, pulse 91, temperature 98 F (36.7 C), temperature source Oral, resp. rate 16, height 5' 2"  (1.575 m), weight 110.6 kg, SpO2 98 %.  I/O:    Intake/Output Summary (Last 24 hours) at 04/17/2019 1434 Last data filed at 04/17/2019 0451 Gross per 24 hour  Intake 723.46 ml  Output 300 ml  Net 423.46 ml    PHYSICAL EXAMINATION:  GENERAL:  53 y.o.-year-old patient lying in the bed with no acute distress.  EYES: Pupils equal, round, reactive to light and accommodation. No scleral icterus. Extraocular muscles intact.  HEENT: Head atraumatic, normocephalic. Oropharynx and nasopharynx clear.  NECK:  Supple, no jugular venous distention. No thyroid enlargement, no tenderness.  LUNGS: Normal breath sounds bilaterally, no wheezing, rales,rhonchi. No use of accessory muscles of respiration.  CARDIOVASCULAR: S1, S2 normal. No murmurs, rubs, or gallops.  ABDOMEN: Soft, non-tender, non-distended. Bowel sounds present. No organomegaly or mass.  EXTREMITIES: No pedal edema, cyanosis, or clubbing.  NEUROLOGIC: Cranial nerves II through XII are intact. No focal motor or sensory defecits b/l.  PSYCHIATRIC: The patient is alert and oriented x 3.  SKIN: No obvious rash, lesion, or ulcer.   DATA REVIEW:   CBC Recent Labs  Lab 04/16/19 0757  WBC 10.8*  HGB 11.4*  HCT 37.4  PLT 182    Chemistries  Recent Labs  Lab 04/16/19 0757  NA 135  K 4.1  CL 99  CO2 21*  GLUCOSE 514*  BUN 11  CREATININE 1.02*  CALCIUM 8.8*    Cardiac Enzymes No results for input(s): TROPONINI in the last 168 hours.  Microbiology Results  Results for orders placed or performed during the hospital encounter of 04/16/19  SARS Coronavirus 2 (CEPHEID - Performed in [place] hospital lab), Hosp Order     Status: None   Collection Time: 04/16/19  9:35 AM   Specimen: Nasopharyngeal Swab  Result Value Ref Range Status   SARS Coronavirus 2 NEGATIVE NEGATIVE  Final    Comment: (NOTE) If result is NEGATIVE SARS-CoV-2 target nucleic acids are NOT DETECTED. The SARS-CoV-2 RNA is generally detectable in upper and lower  respiratory specimens during the acute phase of infection. The lowest  concentration of SARS-CoV-2 viral copies this assay can detect is 250  copies / mL. A negative result does not preclude SARS-CoV-2 infection  and should not be used as the sole basis for treatment or other  patient management decisions.  A negative result may occur with  improper specimen collection / handling, submission of specimen other  than nasopharyngeal swab, presence of viral mutation(s) within the  areas targeted by this assay, and inadequate number of viral copies  (<250 copies / mL). A negative result must be combined with clinical  observations, patient history, and epidemiological information. If result is POSITIVE SARS-CoV-2 target nucleic acids are DETECTED. The SARS-CoV-2 RNA is generally detectable in upper and lower  respiratory specimens dur ing the acute phase of infection.  Positive  results are indicative of active infection with SARS-CoV-2.  Clinical  correlation with patient history and other diagnostic information is  necessary to determine patient infection status.  Positive results do  not rule out bacterial infection or co-infection with other viruses. If result is PRESUMPTIVE POSTIVE SARS-CoV-2 nucleic acids MAY BE PRESENT.   A presumptive positive result was obtained on the submitted specimen  and confirmed on repeat testing.  While 2019 novel coronavirus  (SARS-CoV-2) nucleic acids may be present in the submitted sample  additional confirmatory testing may be necessary for epidemiological  and / or clinical management purposes  to differentiate between  SARS-CoV-2 and other Sarbecovirus currently known to infect humans.  If clinically indicated additional testing with an alternate test  methodology [id number]) is advised. The  SARS-CoV-2 RNA is generally  detectable in upper and lower respiratory sp ecimens during the acute  phase of infection. The expected result is Negative. Fact Sheet for Patients:  StrictlyIdeas.no Fact Sheet for Healthcare Providers: BankingDealers.co.za This test is not yet approved or cleared by the [place] FDA and has been authorized for detection and/or diagnosis of SARS-CoV-2 by FDA under an Emergency Use Authorization (EUA).  This EUA will remain in effect (meaning this test can be used) for the duration of the COVID-19 declaration under Section 564(b)(1) of the Act, 21 U.S.C. section 360bbb-3(b)(1), unless the authorization is terminated or revoked sooner. Performed at [facility], [place], [place] [address]     RADIOLOGY:  Ct Head Wo Contrast  Result Date: 04/16/2019 CLINICAL DATA:  53 year old female with acute confusion, poorly arousable this morning. EXAM: CT HEAD WITHOUT CONTRAST  TECHNIQUE: Contiguous axial images were obtained from the base of the skull through the vertex without intravenous contrast. COMPARISON:  Brain MRI 08/14/2016 and earlier. FINDINGS: Brain: Chronic right hemisphere encephalomalacia, primarily in the right MCA territory but also involving the thalamus is stable since 2017. New hypodensity in the brainstem since 2017, bilateral but greater on the left (series 2, image 9). No midline shift, ventriculomegaly, mass effect, evidence of mass lesion, intracranial hemorrhage or evidence of cortically based acute infarction. Vascular: Calcified atherosclerosis at the skull base. No suspicious intracranial vascular hyperdensity. Skull: No acute osseous abnormality identified. Sinuses/Orbits: Visualized paranasal sinuses and mastoids are stable and well pneumatized. Other: Similar Disconjugate gaze. Visualized scalp soft tissues are within normal limits. IMPRESSION: 1. New hypodensity in the  brainstem since 2017 compatible with age indeterminate pontine lacunar infarcts. 2. Chronic right hemisphere encephalomalacia is stable. No acute intracranial hemorrhage. Electronically Signed   By: Genevie Ann M.D.   On: 04/16/2019 09:41   Mr Brain Wo Contrast  Result Date: 04/16/2019 CLINICAL DATA:  Initial evaluation for decreased responsiveness. EXAM: MRI HEAD WITHOUT CONTRAST TECHNIQUE: Multiplanar, multiecho pulse sequences of the brain and surrounding structures were obtained without intravenous contrast. COMPARISON:  Prior CT from earlier the same day. FINDINGS: Brain: Examination moderately degraded by motion artifact. Diffuse prominence of the CSF containing spaces compatible with generalized age-related cerebral atrophy. Patchy hypodensity within the periventricular and deep white matter both cerebral hemispheres most consistent with chronic microvascular ischemic disease, mild to moderate nature. Superimposed remote lacunar infarct present at the ventral left thalamus. Small vessel type changes with multiple remote lacunar infarcts present within the pons. Extensive encephalomalacia with gliosis throughout the right parieto-occipital in temporal region compatible with chronic right MCA territory infarct. No abnormal foci of restricted diffusion to suggest acute or subacute ischemia. Gray-white matter differentiation met maintained. No other areas of chronic infarction. Few punctate chronic micro hemorrhages noted about the left thalamus and left basal ganglia, likely related underlying hypertension. No acute intracranial hemorrhage. No mass lesion, midline shift or mass effect. Ex vacuo dilatation of the right lateral ventricle related to the chronic right MCA territory infarct. No hydrocephalus. No extra-axial fluid collection. Vascular: Major intracranial vascular flow voids are maintained at the skull base. Skull and upper cervical spine: Craniocervical junction grossly within normal limits. Bone  marrow signal intensity normal. No scalp soft tissue abnormality. Sinuses/Orbits: Globes and orbital soft tissues within normal limits. Minimal mucosal thickening within the ethmoidal air cells and maxillary sinuses. Paranasal sinuses are otherwise clear. No mastoid effusion. Inner ear structures normal. Other: None. IMPRESSION: 1. No acute intracranial abnormality identified. 2. Extensive chronic ischemic changes, with large remote right posterior MCA territory infarct, with additional chronic lacunar infarcts involving the ventral left thalamus and pons. 3. Underlying atrophy with mild to moderate chronic microvascular ischemic disease. Electronically Signed   By: Jeannine Boga M.D.   On: 04/16/2019 23:33   Dg Chest Portable 1 View  Result Date: 04/16/2019 CLINICAL DATA:  Altered mental status EXAM: PORTABLE CHEST 1 VIEW COMPARISON:  Chest radiograph 05/12/2016 FINDINGS: Monitoring leads overlie the patient. Stable cardiomegaly. Bibasilar atelectasis. No large area of pulmonary consolidation. No pleural effusion or pneumothorax. IMPRESSION: No acute cardiopulmonary process. Cardiomegaly. Electronically Signed   By: Lovey Newcomer M.D.   On: 04/16/2019 09:28      Management plans discussed with the patient, family and they are in agreement.  CODE STATUS:     Code Status Orders  (From admission, onward)  Start     Ordered   04/16/19 1554  Full code  Continuous     04/16/19 1553         TOTAL TIME TAKING CARE OF THIS PATIENT: 40 minutes.    Henreitta Leber M.D on 04/17/2019 at 2:34 PM  Between 7am to 6pm - Pager - [phone]  After 6pm go to [email or web address] - [facility]  Office  [phone]  CC: Primary care physician; [facility] [place]

## 2019-04-17 NOTE — Consult Note (Signed)
Reason for Consult: confusion  Referring Physician: Dr.  Verdell Carmine  CC: confusion   HPI: [name] is an 53 y.o. female with a known history of diabetes type 2, depression, hypertension, hyperlipidemia, seizure and a previous who was brought to the hospital with decreasing responsiveness as she was difficulty to wake up yesteray. No clear seizure activity, post ictal state.  Currently improved and close to baseline.   Past Medical History:  Diagnosis Date  . Allergic rhinitis   . Chickenpox   . Depression   . Diabetes ([place])   . Epilepsy ([place])   . Headache   . HTN (hypertension)   . Hyperlipidemia   . Seizures ([place])   . Stroke ([place])   . Urinary incontinence     Past Surgical History:  Procedure Laterality Date  . CESAREAN SECTION    . INCISION AND DRAINAGE Right 05/13/2016   Procedure: INCISION AND DRAINAGE;  Surgeon: Samara Deist, DPM;  Location: [facility] ORS;  Service: Podiatry;  Laterality: Right;    Family History  Problem Relation Age of Onset  . Hypertension Mother   . Diabetes Mellitus II Mother   . Hypertension Father   . Pancreatic cancer Father     Social History:  reports that she has never smoked. She has never used smokeless tobacco. She reports that she does not drink alcohol or use drugs.  No Known Allergies  Medications: I have reviewed the patient's current medications.  ROS: History obtained from the patient  General ROS: negative for - chills, fatigue, fever, night sweats, weight gain or weight loss Psychological ROS: negative for - behavioral disorder, hallucinations, memory difficulties, mood swings or suicidal ideation Ophthalmic ROS: negative for - blurry vision, double vision, eye pain or loss of vision ENT ROS: negative for - epistaxis, nasal discharge, oral lesions, sore throat, tinnitus or vertigo Allergy and Immunology ROS: negative for - hives or itchy/watery eyes Hematological and Lymphatic ROS: negative for - bleeding problems, bruising or  swollen lymph nodes Endocrine ROS: negative for - galactorrhea, hair pattern changes, polydipsia/polyuria or temperature intolerance Respiratory ROS: negative for - cough, hemoptysis, shortness of breath or wheezing Cardiovascular ROS: negative for - chest pain, dyspnea on exertion, edema or irregular heartbeat Gastrointestinal ROS: negative for - abdominal pain, diarrhea, hematemesis, nausea/vomiting or stool incontinence Genito-Urinary ROS: negative for - dysuria, hematuria, incontinence or urinary frequency/urgency Musculoskeletal ROS: negative for - joint swelling or muscular weakness Neurological ROS: as noted in HPI Dermatological ROS: negative for rash and skin lesion changes  Physical Examination: Blood pressure (!) 143/79, pulse 91, temperature 98 F (36.7 C), temperature source Oral, resp. rate 16, height 5' 2"  (1.575 m), weight 110.6 kg, SpO2 98 %.    Neurological Examination   Mental Status: Alert, oriented, thought content appropriate.  Speech fluent without evidence of aphasia.  Able to follow 3 step commands without difficulty. Cranial Nerves: II: Discs flat bilaterally; Visual fields grossly normal, pupils equal, round, reactive to light and accommodation III,IV, VI: ptosis not present, extra-ocular motions intact bilaterally V,VII: smile symmetric, facial light touch sensation normal bilaterally VIII: hearing normal bilaterally IX,X: gag reflex present XI: bilateral shoulder shrug XII: midline tongue extension Motor: Right : Upper extremity   5/5    Left:     Upper extremity   5/5  Lower extremity   5/5     Lower extremity   5/5 Tone and bulk:normal tone throughout; no atrophy noted Sensory: Pinprick and light touch intact throughout, bilaterally Deep Tendon Reflexes: 1+ and  symmetric throughout Plantars: Right: downgoing   Left: downgoing Cerebellar: normal finger-to-nose, normal rapid alternating movements and normal heel-to-shin test Gait: not tested       Laboratory Studies:   Basic Metabolic Panel: Recent Labs  Lab 04/16/19 0757  NA 135  K 4.1  CL 99  CO2 21*  GLUCOSE 514*  BUN 11  CREATININE 1.02*  CALCIUM 8.8*    Liver Function Tests: No results for input(s): AST, ALT, ALKPHOS, BILITOT, PROT, ALBUMIN in the last 168 hours. No results for input(s): LIPASE, AMYLASE in the last 168 hours. No results for input(s): AMMONIA in the last 168 hours.  CBC: Recent Labs  Lab 04/16/19 0757  WBC 10.8*  HGB 11.4*  HCT 37.4  MCV 64.9*  PLT 182    Cardiac Enzymes: No results for input(s): CKTOTAL, CKMB, CKMBINDEX, TROPONINI in the last 168 hours.  BNP: Invalid input(s): POCBNP  CBG: Recent Labs  Lab 04/16/19 0801 04/16/19 1253 04/16/19 1625 04/16/19 2139 04/17/19 0746  GLUCAP 509* 281* 307* 226* 219*    Microbiology: Results for orders placed or performed during the hospital encounter of 04/16/19  SARS Coronavirus 2 (CEPHEID - Performed in [place] hospital lab), Hosp Order     Status: None   Collection Time: 04/16/19  9:35 AM   Specimen: Nasopharyngeal Swab  Result Value Ref Range Status   SARS Coronavirus 2 NEGATIVE NEGATIVE Final    Comment: (NOTE) If result is NEGATIVE SARS-CoV-2 target nucleic acids are NOT DETECTED. The SARS-CoV-2 RNA is generally detectable in upper and lower  respiratory specimens during the acute phase of infection. The lowest  concentration of SARS-CoV-2 viral copies this assay can detect is 250  copies / mL. A negative result does not preclude SARS-CoV-2 infection  and should not be used as the sole basis for treatment or other  patient management decisions.  A negative result may occur with  improper specimen collection / handling, submission of specimen other  than nasopharyngeal swab, presence of viral mutation(s) within the  areas targeted by this assay, and inadequate number of viral copies  (<250 copies / mL). A negative result must be combined with clinical   observations, patient history, and epidemiological information. If result is POSITIVE SARS-CoV-2 target nucleic acids are DETECTED. The SARS-CoV-2 RNA is generally detectable in upper and lower  respiratory specimens dur ing the acute phase of infection.  Positive  results are indicative of active infection with SARS-CoV-2.  Clinical  correlation with patient history and other diagnostic information is  necessary to determine patient infection status.  Positive results do  not rule out bacterial infection or co-infection with other viruses. If result is PRESUMPTIVE POSTIVE SARS-CoV-2 nucleic acids MAY BE PRESENT.   A presumptive positive result was obtained on the submitted specimen  and confirmed on repeat testing.  While 2019 novel coronavirus  (SARS-CoV-2) nucleic acids may be present in the submitted sample  additional confirmatory testing may be necessary for epidemiological  and / or clinical management purposes  to differentiate between  SARS-CoV-2 and other Sarbecovirus currently known to infect humans.  If clinically indicated additional testing with an alternate test  methodology [phone]) is advised. The SARS-CoV-2 RNA is generally  detectable in upper and lower respiratory sp ecimens during the acute  phase of infection. The expected result is Negative. Fact Sheet for Patients:  StrictlyIdeas.no Fact Sheet for Healthcare Providers: BankingDealers.co.za This test is not yet approved or cleared by the [place] FDA and has been authorized for  detection and/or diagnosis of SARS-CoV-2 by FDA under an Emergency Use Authorization (EUA).  This EUA will remain in effect (meaning this test can be used) for the duration of the COVID-19 declaration under Section 564(b)(1) of the Act, 21 U.S.C. section 360bbb-3(b)(1), unless the authorization is terminated or revoked sooner. Performed at [facility], [address] [place], [place] [address]     Coagulation Studies: No results for input(s): LABPROT, INR in the last 72 hours.  Urinalysis:  Recent Labs  Lab 04/16/19 1135  COLORURINE STRAW*  LABSPEC 1.028  PHURINE 7.0  GLUCOSEU >=500*  HGBUR NEGATIVE  BILIRUBINUR NEGATIVE  KETONESUR 20*  PROTEINUR NEGATIVE  NITRITE NEGATIVE  LEUKOCYTESUR NEGATIVE    Lipid Panel:     Component Value Date/Time   CHOL 214 (H) 04/17/2019 0457   CHOL 215 (H) 12/27/2014 1033   TRIG 142 04/17/2019 0457   TRIG 103 12/27/2014 1033   HDL 51 04/17/2019 0457   HDL 46 12/27/2014 1033   CHOLHDL 4.2 04/17/2019 0457   VLDL 28 04/17/2019 0457   VLDL 21 12/27/2014 1033   LDLCALC 135 (H) 04/17/2019 0457   LDLCALC 148 (H) 12/27/2014 1033    HgbA1C:  Lab Results  Component Value Date   HGBA1C 11.3 (H) 04/17/2019    Urine Drug Screen:      Component Value Date/Time   LABOPIA NONE DETECTED 08/12/2016 1015   COCAINSCRNUR NONE DETECTED 08/12/2016 1015   LABBENZ NONE DETECTED 08/12/2016 1015   AMPHETMU NONE DETECTED 08/12/2016 1015   THCU NONE DETECTED 08/12/2016 1015   LABBARB NONE DETECTED 08/12/2016 1015    Alcohol Level: No results for input(s): ETH in the last 168 hours.  Imaging: Ct Head Wo Contrast  Result Date: 04/16/2019 CLINICAL DATA:  53 year old female with acute confusion, poorly arousable this morning. EXAM: CT HEAD WITHOUT CONTRAST TECHNIQUE: Contiguous axial images were obtained from the base of the skull through the vertex without intravenous contrast. COMPARISON:  Brain MRI 08/14/2016 and earlier. FINDINGS: Brain: Chronic right hemisphere encephalomalacia, primarily in the right MCA territory but also involving the thalamus is stable since 2017. New hypodensity in the brainstem since 2017, bilateral but greater on the left (series 2, image 9). No midline shift, ventriculomegaly, mass effect, evidence of mass lesion, intracranial hemorrhage or evidence of cortically based acute infarction.  Vascular: Calcified atherosclerosis at the skull base. No suspicious intracranial vascular hyperdensity. Skull: No acute osseous abnormality identified. Sinuses/Orbits: Visualized paranasal sinuses and mastoids are stable and well pneumatized. Other: Similar Disconjugate gaze. Visualized scalp soft tissues are within normal limits. IMPRESSION: 1. 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 2. Chronic right hemisphere encephalomalacia is stable. No acute intracranial hemorrhage. Electronically Signed   By: Genevie Ann M.D.   On: 04/16/2019 09:41   Mr Brain Wo Contrast  Result Date: 04/16/2019 CLINICAL DATA:  Initial evaluation for decreased responsiveness. EXAM: MRI HEAD WITHOUT CONTRAST TECHNIQUE: Multiplanar, multiecho pulse sequences of the brain and surrounding structures were obtained without intravenous contrast. COMPARISON:  Prior CT from earlier the same day. FINDINGS: Brain: Examination moderately degraded by motion artifact. Diffuse prominence of the CSF containing spaces compatible with generalized age-related cerebral atrophy. Patchy hypodensity within the periventricular and deep white matter both cerebral hemispheres most consistent with chronic microvascular ischemic disease, mild to moderate nature. Superimposed remote lacunar infarct present at the ventral left thalamus. Small vessel type changes with multiple remote lacunar infarcts present within the pons. Extensive encephalomalacia with  gliosis throughout the right parieto-occipital in temporal region compatible with chronic right MCA territory infarct. No abnormal foci of restricted diffusion to suggest acute or subacute ischemia. Gray-white matter differentiation met maintained. No other areas of chronic infarction. Few punctate chronic micro hemorrhages noted about the left thalamus and left basal ganglia, likely related underlying hypertension. No acute intracranial hemorrhage. No  mass lesion, midline shift or mass effect. Ex vacuo dilatation of the right lateral ventricle related to the chronic right MCA territory infarct. No hydrocephalus. No extra-axial fluid collection. Vascular: Major intracranial vascular flow voids are maintained at the skull base. Skull and upper cervical spine: Craniocervical junction grossly within normal limits. Bone marrow signal intensity normal. No scalp soft tissue abnormality. Sinuses/Orbits: Globes and orbital soft tissues within normal limits. Minimal mucosal thickening within the ethmoidal air cells and maxillary sinuses. Paranasal sinuses are otherwise clear. No mastoid effusion. Inner ear structures normal. Other: None. IMPRESSION: 1. No acute intracranial abnormality identified. 2. Extensive chronic ischemic changes, with large remote right posterior MCA territory infarct, with additional chronic lacunar infarcts involving the ventral left thalamus and pons. 3. Underlying atrophy with mild to moderate chronic microvascular ischemic disease. Electronically Signed   By: Jeannine Boga M.D.   On: 04/16/2019 23:33   Dg Chest Portable 1 View  Result Date: 04/16/2019 CLINICAL DATA:  Altered mental status EXAM: PORTABLE CHEST 1 VIEW COMPARISON:  Chest radiograph 05/12/2016 FINDINGS: Monitoring leads overlie the patient. Stable cardiomegaly. Bibasilar atelectasis. No large area of pulmonary consolidation. No pleural effusion or pneumothorax. IMPRESSION: No acute cardiopulmonary process. Cardiomegaly. Electronically Signed   By: Lovey Newcomer M.D.   On: 04/16/2019 09:28     Assessment/Plan:  53 y.o. female with a known history of diabetes type 2, depression, hypertension, hyperlipidemia, seizure and a previous who was brought to the hospital with decreasing responsiveness as she was difficulty to wake up yesteray. No clear seizure activity, post ictal state.  Currently improved and close to baseline.   - MRI no acute abnormalities - pt has woken  up and follows commands - On VPA likely for mood disorder but not sure she is compliant with it as very low not therapeutic level even though I think this is for mood stabilization - she was started on Keppra, which I am not sure is needed as psychiatric hx and likely non compliance with meds. Would d/c - No further imaging from neuro stand point [facility], Jolita Haefner  04/17/2019, 11:26 AM

## 2019-04-17 NOTE — Evaluation (Addendum)
Occupational Therapy Evaluation Patient Details Name: [name] MRN: [id number] DOB: 04/07/1966 Today's Date: 04/17/2019    History of Present Illness 53 y.o. female with a known history of diabetes type 2, depression, hypertension, hyperlipidemia, and seizure who was brought to the hospital with decreasing responsiveness as she was difficult to wake up yesterday. Stroke work up negative for acute infarct.   Clinical Impression   Pt seen for OT evaluation this date. Prior to hospital admission, pt was living at home with her spouse and 2 adult daughters. No family present to confirm/deny information provided by pt. Currently pt demonstrates impairments in vision (possible Exotropia?) and experiencing intermittent blurry vision, cognitive processing and mild slurred speech (baseline?), weakness, bilat foot numbness that "comes and goes," and decreased safety awareness requiring increased assist for ADL and mobility.  Pt would benefit from skilled OT to address noted impairments and functional limitations (see below for any additional details) in order to maximize safety and independence while minimizing falls risk and caregiver burden.  Upon hospital discharge[facility] RN[facility] notified.    Follow Up Recommendations  Home health OT;Supervision/Assistance - 24 hour    Equipment Recommendations  None recommended by OT    Recommendations for Other Services       Precautions / Restrictions Precautions Precautions: Fall Restrictions Weight Bearing Restrictions: No      Mobility Bed Mobility Overal bed mobility: Independent             General bed mobility comments: no difficulty  Transfers                 General transfer comment: deferred, pt requesting to rest and then dining services called    Balance                                           ADL either performed or assessed with clinical judgement   ADL                                         General ADL Comments: Pt appears near baseline, but requires increased supervision assist for safety     Vision Patient Visual Report: Blurring of vision(pt reports intermittent blurry vision that comes and goes, starting 1 week ago) Vision Assessment?: Yes Eye Alignment: Impaired (comment)(possible Exotropia?) Ocular Range of Motion: Within Functional Limits Alignment/Gaze Preference: Within Defined Limits Tracking/Visual Pursuits: Able to track stimulus in all quads without difficulty Convergence: Impaired - to be further tested in functional context     Perception     Praxis      Pertinent Vitals/Pain Pain Assessment: 0-10 Pain Score: 6  Pain Location: headache Pain Descriptors / Indicators: Headache Pain Intervention(s): Limited activity within patient's tolerance;Monitored during session;Patient requesting pain meds-RN notified     Hand Dominance Right   Extremity/Trunk Assessment Upper Extremity Assessment Upper Extremity Assessment: Generalized weakness(limited participation with testing, but functionally demo's limited deficits)   Lower Extremity Assessment Lower Extremity Assessment: Generalized weakness(limited participation with testing, but functionally demo's limited deficits; pt endorses decreased sensation/numbness to both feet that comes and goes and does not appear to improve or worsen with positional changes)       Communication Communication Communication: Expressive difficulties(very slight slurred speech, appears to be baseline)   Cognition  Arousal/Alertness: Awake/alert Behavior During Therapy: Flat affect Overall Cognitive Status: No family/caregiver present to determine baseline cognitive functioning                                 General Comments: pt relatively alert (a bit sleepy), oriented, follows commands, but slow to process and respond to questions   General Comments       Exercises      Shoulder Instructions      Home Living Family/patient expects to be discharged to:: Private residence Living Arrangements: Children(pt reports 2 adult daughters live with her and her husband) Available Help at Discharge: Family(unclear if there 24/7, spouse works night shift) Type of Home: House                                  Prior Functioning/Environment Level of Independence: Needs assistance  Gait / Transfers Assistance Needed: amb w/ RW ADL's / Homemaking Assistance Needed: pt reports indep with basic ADL, assist from family for IADL including driving            OT Problem List: Decreased cognition;Decreased safety awareness;Impaired vision/perception;Pain;Decreased strength      OT Treatment/Interventions: Self-care/ADL training;Therapeutic exercise;Therapeutic activities;Cognitive remediation/compensation;Visual/perceptual remediation/compensation;DME and/or AE instruction;Patient/family education    OT Goals(Current goals can be found in the care plan section) Acute Rehab OT Goals Patient Stated Goal: go home OT Goal Formulation: With patient Time For Goal Achievement: 05/01/19 Potential to Achieve Goals: Good ADL Goals Pt Will Perform Lower Body Dressing: with supervision;sit to/from stand Pt Will Transfer to Toilet: with supervision;ambulating(LRAD for amb)  OT Frequency: Min 1X/week   Barriers to D/C:            Co-evaluation              AM-PAC OT "6 Clicks" Daily Activity     Outcome Measure Help from another person eating meals?: None Help from another person taking care of personal grooming?: None Help from another person toileting, which includes using toliet, bedpan, or urinal?: A Little Help from another person bathing (including washing, rinsing, drying)?: A Little Help from another person to put on and taking off regular upper body clothing?: None Help from another person to put on and taking off regular lower body clothing?: A  Little 6 Click Score: 21   End of Session Nurse Communication: Patient requests pain meds  Activity Tolerance: Patient limited by fatigue Patient left: in bed;with call bell/phone within reach;with bed alarm set  OT Visit Diagnosis: Other abnormalities of gait and mobility (R26.89);Muscle weakness (generalized) (M62.81);Low vision, both eyes (H54.2)                Time: 9518-8416 OT Time Calculation (min): 15 min Charges:  OT General Charges $OT Visit: 1 Visit OT Evaluation $OT Eval Low Complexity: 1 Low  Jeni Salles, MPH, MS, OTR/L ascom [phone] 04/17/19, 12:14 PM

## 2019-04-17 NOTE — Evaluation (Signed)
Physical Therapy Evaluation Patient Details Name: [name] MRN: [id number] DOB: 09/19/1965 Today's Date: 04/17/2019   History of Present Illness  53 y.o. female with a known history of diabetes type 2, depression, hypertension, hyperlipidemia, and seizure who was brought to the hospital with decreasing responsiveness as she was difficult to wake up yesterday. Stroke work up negative for acute infarct.  Clinical Impression  Pt did relatively well with PT exam and was able to ambulate ~100 ft in room with FWW.  She reports she uses RW or cane at baseline, and feels as though her cadence/gait is about at her baseline.  Pt agrees that she feels safe to go home and does not require further PT intervention.  Overall showed good mobility and safety, and all issues appear to be baseline level; no further PT needs.    Follow Up Recommendations No PT follow up    Equipment Recommendations  None recommended by PT    Recommendations for Other Services       Precautions / Restrictions Precautions Precautions: Fall Restrictions Weight Bearing Restrictions: No      Mobility  Bed Mobility Overal bed mobility: Independent             General bed mobility comments: Pt was able to get to EOB easily and without issue  Transfers Overall transfer level: Modified independent Equipment used: Rolling walker (2 wheeled)             General transfer comment: Pt able to rise to standing w/o physical assist, no LOBs or safety issues  Ambulation/Gait Ambulation/Gait assistance: Supervision Gait Distance (Feet): 100 Feet Assistive device: Rolling walker (2 wheeled)       General Gait Details: Pt is able to ambulate with good safety and confidence, light use of the walker and slow but confident cadence that she reports is her baseline.  Stairs            Wheelchair Mobility    Modified Rankin (Stroke Patients Only)       Balance Overall balance assessment: Independent                                            Pertinent Vitals/Pain Pain Assessment: No/denies pain Pain Score: 6  Pain Location: headache Pain Descriptors / Indicators: Headache Pain Intervention(s): Limited activity within patient's tolerance;Monitored during session;Patient requesting pain meds-RN notified    Home Living Family/patient expects to be discharged to:: Private residence Living Arrangements: Spouse/significant other;Children Available Help at Discharge: Family;Available 24 hours/day Type of Home: House Home Access: Stairs to enter Entrance Stairs-Rails: None Entrance Stairs-Number of Steps: 3   Home Equipment: Cane - single point;Walker - 2 wheels      Prior Function Level of Independence: Independent with assistive device(s)   Gait / Transfers Assistance Needed: (reports she uses AD in and out of the home, usually SPC?)  ADL's / Homemaking Assistance Needed: pt reports indep with basic ADL, assist from family for IADL including driving        Hand Dominance   Dominant Hand: Right    Extremity/Trunk Assessment   Upper Extremity Assessment Upper Extremity Assessment: Generalized weakness;Overall [facility] for tasks assessed    Lower Extremity Assessment Lower Extremity Assessment: Generalized weakness;Overall WFL for tasks assessed(equal bilaterally)       Communication   Communication: Expressive difficulties  Cognition Arousal/Alertness: Awake/alert Behavior During Therapy: Flat affect  Overall Cognitive Status: No family/caregiver present to determine baseline cognitive functioning                                 General Comments: pt relatively alert (a bit sleepy), oriented, follows commands, but slow to process and respond to questions      General Comments      Exercises     Assessment/Plan    PT Assessment Patent does not need any further PT services  PT Problem List         PT Treatment Interventions      PT  Goals (Current goals can be found in the Care Plan section)  Acute Rehab PT Goals Patient Stated Goal: go home PT Goal Formulation: All assessment and education complete, DC therapy    Frequency     Barriers to discharge        Co-evaluation               AM-PAC PT "6 Clicks" Mobility  Outcome Measure Help needed turning from your back to your side while in a flat bed without using bedrails?: None Help needed moving from lying on your back to sitting on the side of a flat bed without using bedrails?: None Help needed moving to and from a bed to a chair (including a wheelchair)?: None Help needed standing up from a chair using your arms (e.g., wheelchair or bedside chair)?: None Help needed to walk in hospital room?: A Little Help needed climbing 3-5 steps with a railing? : A Little 6 Click Score: 22    End of Session Equipment Utilized During Treatment: Gait belt Activity Tolerance: Patient tolerated treatment well Patient left: with chair alarm set;with call bell/phone within reach Nurse Communication: Mobility status PT Visit Diagnosis: Muscle weakness (generalized) (M62.81);Difficulty in walking, not elsewhere classified (R26.2)    Time: 9381-0175 PT Time Calculation (min) (ACUTE ONLY): 28 min   Charges:   PT Evaluation $PT Eval Low Complexity: 1 Low PT Treatments $Gait Training: 8-22 mins        Kreg Shropshire, DPT 04/17/2019, 1:13 PM

## 2019-04-17 NOTE — Plan of Care (Signed)
[name] to be D/C'd Home per MD. Discussed with the Bea Laura spouse and all questions fully answered.   VSS, skin clean, dry and intact without evidence of skin break down, no evidence of skin tears noted.  IV catheters discontinued intact. Site without signs and symptoms of complications. Dressing and pressure applied.   An after visit summary was printed and given to the Spouse.   D/c education completed with patient/family including follow up instructions, medications list, d/c activities limitations if indicated, with other d/c instructions as indicated by MD- patient able to verbalize understanding, all questions fully answered.   Patient instructed to return to ED, call 911, or Call MD for any changes in condition.  [facility] and D/C home via private auto.

## 2019-04-17 NOTE — TOC Progression Note (Signed)
Transition of Care [facility]) - Progression Note    Patient Details  Name: [name] MRN: [id number] Date of Birth: 1966-05-07  [facility]) CM/SW Contact  Shelbie Hutching, RN Phone Number: 04/17/2019, 10:26 AM  Clinical Narrative:    [facility], and [facility] were all unable to accept patient due to staffing.  [facility] accepted referral.  Patient will discharge home with [facility] for RN, PT, and aide.    Expected Discharge Plan: [place] Barriers to Discharge: Continued Medical Work up  Expected Discharge Plan and Services Expected Discharge Plan: [place]   Discharge Planning Services: CM Consult Post Acute Care Choice: [place] arrangements for the past 2 months: Single Family Home Expected Discharge Date: 04/18/19                         HH Arranged: RN, PT, Nurse's Aide [place] Agency: [facility] Date [place]: 04/17/19 Time [place]: 1026 Representative spoke with at [place]: [place] (SDOH) Interventions    Readmission Risk Interventions No flowsheet data found.

## 2019-04-17 NOTE — TOC Transition Note (Signed)
Transition of Care [facility]) - CM/SW Discharge Note   Patient Details  Name: [name] MRN: [id number] Date of Birth: Jan 30, 1966  Transition of Care [facility]) CM/SW Contact:  Shelbie Hutching, RN Phone Number: 04/17/2019, 11:56 AM   Clinical Narrative:    Patient will discharge home with husband today.  Home Health services set up with [facility].  Husband will pick patient up at discharge.     Final next level of care: Home w Home Health Services Barriers to Discharge: Barriers Resolved   Patient Goals and CMS Choice   CMS Medicare.gov Compare Post Acute Care list provided to:: Patient Represenative (must comment) Choice offered to / list presented to : Spouse  Discharge Placement                       Discharge Plan and Services   Discharge Planning Services: CM Consult Post Acute Care Choice: Home Health                    HH Arranged: RN, PT, OT, Nurse's Aide [place] Agency: [facility] (now [facility]) Date [place]: 04/17/19 Time [place]: 7106 Representative spoke with at [place]: [place] (SDOH) Interventions     Readmission Risk Interventions No flowsheet data found.

## 2019-04-17 NOTE — Progress Notes (Signed)
SLP Cancellation Note  Patient Details Name: [name] MRN: [id number] DOB: 28-Apr-1966   Cancelled treatment:       Reason Eval/Treat Not Completed: SLP screened, no needs identified, will sign off(chart reviewed; consulted NSG then met w/ pt ). Pt if fully awake today and feeding self lunch meal upon entering room; no overt s/s of aspiration were noted while in room. 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 Pt is swallowing Pills w/ NSG adequately per report. Pt conversed minimally w/ SLP but has had further conversational level w/out deficits noted per NSG; pt denied any speech-language deficits.  No further skilled ST services indicated as pt appears at her baseline. Recommended she f/u w/ her PCP if any changes noted that she would like to seek Outpt services for. Pt agreed. NSG to reconsult if any change in status.     Orinda Kenner, MS, CCC-SLP Watson,Katherine 04/17/2019, 12:47 PM

## 2019-04-17 NOTE — Progress Notes (Signed)
*  PRELIMINARY RESULTS* Echocardiogram 2D Echocardiogram has been performed.  [name] 04/17/2019, 10:47 AM

## 2019-07-20 ENCOUNTER — Other Ambulatory Visit: Payer: Self-pay

## 2019-07-20 ENCOUNTER — Encounter: Payer: Self-pay | Admitting: Emergency Medicine

## 2019-07-20 ENCOUNTER — Emergency Department: Payer: BC Managed Care – PPO

## 2019-07-20 ENCOUNTER — Emergency Department
Admission: EM | Admit: 2019-07-20 | Discharge: 2019-07-20 | Disposition: A | Discharge status: Home / Self Care | Payer: BC Managed Care – PPO | Attending: Emergency Medicine | Admitting: Emergency Medicine

## 2019-07-20 DIAGNOSIS — Z7982 Long term (current) use of aspirin: Secondary | ICD-10-CM | POA: Diagnosis not present

## 2019-07-20 DIAGNOSIS — M79605 Pain in left leg: Secondary | ICD-10-CM | POA: Diagnosis present

## 2019-07-20 DIAGNOSIS — Z79899 Other long term (current) drug therapy: Secondary | ICD-10-CM | POA: Insufficient documentation

## 2019-07-20 DIAGNOSIS — I10 Essential (primary) hypertension: Secondary | ICD-10-CM | POA: Insufficient documentation

## 2019-07-20 DIAGNOSIS — E119 Type 2 diabetes mellitus without complications: Secondary | ICD-10-CM | POA: Diagnosis not present

## 2019-07-20 DIAGNOSIS — R531 Weakness: Secondary | ICD-10-CM | POA: Insufficient documentation

## 2019-07-20 DIAGNOSIS — Z8673 Personal history of transient ischemic attack (TIA), and cerebral infarction without residual deficits: Secondary | ICD-10-CM | POA: Diagnosis not present

## 2019-07-20 DIAGNOSIS — Z794 Long term (current) use of insulin: Secondary | ICD-10-CM | POA: Diagnosis not present

## 2019-07-20 DIAGNOSIS — N390 Urinary tract infection, site not specified: Secondary | ICD-10-CM | POA: Insufficient documentation

## 2019-07-20 LAB — CBC WITH DIFFERENTIAL/PLATELET
Abs Immature Granulocytes: 0.04 10*3/uL (ref 0.00–0.07)
Basophils Absolute: 0.1 10*3/uL (ref 0.0–0.1)
Basophils Relative: 1 %
Eosinophils Absolute: 0.1 10*3/uL (ref 0.0–0.5)
Eosinophils Relative: 1 %
HCT: 31.8 % — ABNORMAL LOW (ref 36.0–46.0)
Hemoglobin: 9.9 g/dL — ABNORMAL LOW (ref 12.0–15.0)
Immature Granulocytes: 0 %
Lymphocytes Relative: 19 %
Lymphs Abs: 1.9 10*3/uL (ref 0.7–4.0)
MCH: 19.7 pg — ABNORMAL LOW (ref 26.0–34.0)
MCHC: 31.1 g/dL (ref 30.0–36.0)
MCV: 63.2 fL — ABNORMAL LOW (ref 80.0–100.0)
Monocytes Absolute: 1.3 10*3/uL — ABNORMAL HIGH (ref 0.1–1.0)
Monocytes Relative: 13 %
Neutro Abs: 6.7 10*3/uL (ref 1.7–7.7)
Neutrophils Relative %: 66 %
Platelets: UNDETERMINED 10*3/uL (ref 150–400)
RBC: 5.03 MIL/uL (ref 3.87–5.11)
RDW: 19.6 % — ABNORMAL HIGH (ref 11.5–15.5)
Smear Review: UNDETERMINED
WBC: 10 10*3/uL (ref 4.0–10.5)
nRBC: 0 % (ref 0.0–0.2)

## 2019-07-20 LAB — URINALYSIS, COMPLETE (UACMP) WITH MICROSCOPIC
Bilirubin Urine: NEGATIVE
Glucose, UA: NEGATIVE mg/dL
Hgb urine dipstick: NEGATIVE
Ketones, ur: NEGATIVE mg/dL
Nitrite: NEGATIVE
Protein, ur: 100 mg/dL — AB
Specific Gravity, Urine: 1.019 (ref 1.005–1.030)
Squamous Epithelial / HPF: NONE SEEN (ref 0–5)
WBC, UA: >50 WBC/hpf — ABNORMAL HIGH (ref 0–5)
pH: 7 (ref 5.0–8.0)

## 2019-07-20 LAB — TROPONIN I (HIGH SENSITIVITY): Troponin I (High Sensitivity): 17 ng/L (ref <18)

## 2019-07-20 LAB — COMPREHENSIVE METABOLIC PANEL
ALT: 11 U/L (ref 0–44)
AST: 21 U/L (ref 15–41)
Albumin: 3.6 g/dL (ref 3.5–5.0)
Alkaline Phosphatase: 34 U/L — ABNORMAL LOW (ref 38–126)
Anion gap: 14 (ref 5–15)
BUN: 23 mg/dL — ABNORMAL HIGH (ref 6–20)
CO2: 24 mmol/L (ref 22–32)
Calcium: 8.8 mg/dL — ABNORMAL LOW (ref 8.9–10.3)
Chloride: 103 mmol/L (ref 98–111)
Creatinine, Ser: 1.06 mg/dL — ABNORMAL HIGH (ref 0.44–1.00)
GFR calc Af Amer: >60 mL/min (ref >60)
GFR calc non Af Amer: 60 mL/min — ABNORMAL LOW (ref >60)
Glucose, Bld: 74 mg/dL (ref 70–99)
Potassium: 3.6 mmol/L (ref 3.5–5.1)
Sodium: 141 mmol/L (ref 135–145)
Total Bilirubin: 1 mg/dL (ref 0.3–1.2)
Total Protein: 7 g/dL (ref 6.5–8.1)

## 2019-07-20 MED ORDER — SULFAMETHOXAZOLE-TRIMETHOPRIM 800-160 MG PO TABS
1.0000 | ORAL_TABLET | Freq: Once | ORAL | Status: AC
  Administered 2019-07-20: 1 via ORAL
  Filled 2019-07-20: qty 1

## 2019-07-20 MED ORDER — SULFAMETHOXAZOLE-TRIMETHOPRIM 800-160 MG PO TABS: 1.0000 | ORAL_TABLET | Freq: Two times a day (BID) | ORAL | 0 refills | Status: DC

## 2019-07-20 MED ORDER — SODIUM CHLORIDE 0.9 % IV SOLN
1.0000 g | Freq: Once | INTRAVENOUS | Status: DC
  Filled 2019-07-20: qty 10

## 2019-07-20 NOTE — ED Notes (Signed)
Patients brief dry at this time.

## 2019-07-20 NOTE — ED Provider Notes (Signed)
[facility] Emergency Department Provider Note  ____________________________________________   First MD Initiated Contact with Patient 07/20/19 1352     (approximate)  I have reviewed the triage vital signs and the nursing notes.   HISTORY  Chief Complaint Leg Pain and trouble walking    HPI [name] Shipperndrea Deery is a 53 y.o. female is brought to the ED by her husband with complaint of trouble walking due to pain in her left lower extremity.  Husband states that she has had bilateral leg pain for the last 5 days and has trouble walking.  Patient states that she frequently refuses to walk even though she gets physical therapy at home and would rather watch TV shows.  Husband reports that physical therapy works with her to ambulate several times a week.  Patient states that she has fallen on her left knee recently.  She continues to take her regular medication.  She denies any head injury or loss of consciousness with these reported falls.  Husband agrees.  He also states that her blood sugar has been elevated several times.  He also recently started using compression socks on her and states that this is helped with her leg edema.  Patient and husband currently are eating food from a fast food restaurant.  Patient rates her pain as a 10/10.       Past Medical History:  Diagnosis Date  . Allergic rhinitis   . Chickenpox   . Depression   . Diabetes (HCC)   . Epilepsy (HCC)   . Headache   . HTN (hypertension)   . Hyperlipidemia   . Seizures (HCC)   . Stroke (HCC)   . Urinary incontinence     Patient Active Problem List   Diagnosis Date Noted  . Cerebral infarction (HCC) 08/14/2016  . Acute CVA (cerebrovascular accident) (HCC) 08/14/2016  . Delirium due to another medical condition 08/13/2016  . Urinary tract infection 08/13/2016  . Right foot infection 05/11/2016  . Foot abscess, right 05/11/2016  . Anxiety and depression 12/02/2015  . CVA (cerebral infarction)  11/27/2015  . Epilepsy (HCC) 06/17/2015  . 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 Headache 05/19/2015  . Weakness of left side of body 05/19/2015  . History of stroke 05/19/2015  . Severe obesity (BMI >= 40) (HCC) 03/19/2014  . HTN (hypertension)   . Diabetes (HCC)   . Hyperlipidemia   . [name] (transient ischemic attack) 03/17/2014    Past Surgical History:  Procedure Laterality Date  . CESAREAN SECTION    . INCISION AND DRAINAGE Right 05/13/2016   Procedure: INCISION AND DRAINAGE;  Surgeon: Gwyneth RevelsJustin Fowler, DPM;  Location: [facility] ORS;  Service: Podiatry;  Laterality: Right;    Prior to Admission medications   Medication Sig Start Date End Date Taking? Authorizing Provider  aspirin 325 MG EC tablet Take 1 tablet (325 mg total) by mouth daily. 08/16/16   Ghimire, Werner LeanShanker M, MD  atorvastatin (LIPITOR) 40 MG tablet Take 1 tablet (40 mg total) by mouth daily at 6 PM. 08/16/16   Ghimire, Werner LeanShanker M, MD  divalproex (DEPAKOTE ER) 500 MG 24 hr tablet Take 1 tablet (500 mg total) by mouth daily. 08/16/16   GhimireWerner Lean, Shanker M, MD  etodolac (LODINE) 400 MG tablet Take 400 mg by mouth daily as needed.    [provider]  glipiZIDE (GLUCOTROL XL) 5 MG 24 hr tablet Take 1 tablet (5 mg total) by mouth daily. 08/16/16   Ghimire, Werner LeanShanker M, MD  insulin aspart (NOVOLOG  FLEXPEN) 100 UNIT/ML FlexPen Inject 10 Units into the skin 3 (three) times daily with meals. 08/16/16   Ghimire, Henreitta Leber, MD  Insulin Detemir (LEVEMIR FLEXPEN) 100 UNIT/ML Pen Inject 40 Units into the skin daily at 10 pm. 08/16/16   Ghimire, Henreitta Leber, MD  lisinopril-hydrochlorothiazide (PRINZIDE,ZESTORETIC) 20-12.5 MG tablet Take 1 tablet by mouth daily. 08/16/16   Ghimire, Henreitta Leber, MD  metFORMIN (GLUCOPHAGE) 500 MG tablet Take 1 tablet (500 mg total) by mouth 2 (two) times daily with a meal. 08/16/16   Ghimire, Henreitta Leber, MD  metoprolol tartrate (LOPRESSOR) 25 MG tablet Take 25 mg by mouth 2 (two) times a day.    [provider]  sulfamethoxazole-trimethoprim (BACTRIM DS) 800-160 MG tablet Take 1 tablet by mouth 2 (two) times daily. 07/20/19   Johnn Hai, PA-C    Allergies Patient has no known allergies.  Family History  Problem Relation Age of Onset  . Hypertension Mother   . Diabetes Mellitus II Mother   . Hypertension Father   . Pancreatic cancer Father     Social History Social History   Tobacco Use  . Smoking status: Never Smoker  . Smokeless tobacco: Never Used  Substance Use Topics  . Alcohol use: No    Alcohol/week: 0.0 standard drinks  . Drug use: No    Review of Systems Constitutional: No fever/chills Eyes: No visual changes. ENT: No sore throat. Cardiovascular: Denies chest pain. Respiratory: Denies shortness of breath. Gastrointestinal: No abdominal pain.  No nausea, no vomiting.  Genitourinary: Negative for dysuria. Musculoskeletal: Positive for left knee pain.  Positive for generalized left lower extremity pain. Skin: Negative for rash. Neurological: Negative for headaches, focal weakness or numbness.  Patient is status post CVA. ___________________________________________   PHYSICAL EXAM:  VITAL SIGNS: ED Triage Vitals  Enc Vitals Group     BP 07/20/19 1210 127/81     Pulse Rate 07/20/19 1210 (!) 105     Resp 07/20/19 1210 16     Temp 07/20/19 1210 98.9 F (37.2 C)     Temp Source 07/20/19 1210 Oral     SpO2 07/20/19 1210 96 %     Weight 07/20/19 1211 283 lb (128.4 kg)     Height 07/20/19 1211 5\' 2"  (1.575 m)     Head Circumference --      Peak Flow --      Pain Score 07/20/19 1211 10     Pain Loc --      Pain Edu? --      Excl. in [place]? --    Constitutional: Alert and oriented. Well appearing and in no acute distress.  Patient is alert and talkative.  Morbidly obese. Eyes: Conjunctivae are normal.  Head: Atraumatic. Neck: No stridor.   Cardiovascular: Normal rate, regular rhythm. Grossly normal heart sounds.  Good peripheral circulation. Respiratory:  Normal respiratory effort.  No retractions. Lungs CTAB. Gastrointestinal: Soft and nontender. No distention.  Bowel sounds are present x4 quadrants. Musculoskeletal: On examination of the lower extremities there is no gross deformity noted.  There is no warmth or redness appreciated.  Bevelyn Buckles' sign is negative.  Patient is able to move extremities without assistance.  No point tenderness on palpation of the knees or ankles bilaterally.  On compression of the pelvis there is no tenderness.  Patient does have point tenderness on palpation mid femur on the anterior aspect of the left extremity.  No pitting edema is appreciated at this time.  Skin is intact and  without discoloration. Neurologic:  Normal speech and language. No gross focal neurologic deficits are appreciated. No gait instability. Skin:  Skin is warm, dry and intact. No rash noted. Psychiatric: Mood and affect are normal. Speech and behavior are normal.  ____________________________________________   LABS (all labs ordered are listed, but only abnormal results are displayed)  Labs Reviewed  COMPREHENSIVE METABOLIC PANEL - Abnormal; Notable for the following components:      Result Value   BUN 23 (*)    Creatinine, Ser 1.06 (*)    Calcium 8.8 (*)    Alkaline Phosphatase 34 (*)    GFR calc non Af Amer 60 (*)    All other components within normal limits  URINALYSIS, COMPLETE (UACMP) WITH MICROSCOPIC - Abnormal; Notable for the following components:   Color, Urine YELLOW (*)    APPearance CLOUDY (*)    Protein, ur 100 (*)    Leukocytes,Ua LARGE (*)    WBC, UA >50 (*)    Bacteria, UA MANY (*)    All other components within normal limits  URINE CULTURE  CBC WITH DIFFERENTIAL/PLATELET  TROPONIN I (HIGH SENSITIVITY)   ____________________________________________  EKG  Was read by Dr. Cyril Loosen.  Patient had normal sinus rhythm with a ventricular rate of 97, PR interval 150, QRS duration 88.  ____________________________________________  RADIOLOGY  Official radiology report(s): Dg Tibia/fibula Left  Result Date: 07/20/2019 CLINICAL DATA:  Left leg pain EXAM: LEFT FEMUR 2 VIEWS; LEFT TIBIA AND FIBULA - 2 VIEW COMPARISON:  None. FINDINGS: There is no evidence of fracture or other focal bone lesions. Joint spaces of the left hip and ankle appear maintained. Mild joint space narrowing at the left knee. Mild diffuse soft tissue prominence. Scattered vascular calcifications. Soft tissues are unremarkable. IMPRESSION: 1. No acute osseous abnormality of the left femur or tibia-fibula. 2. Diffuse lower extremity soft tissue prominence, nonspecific. 3. Vascular calcifications, greater than expected for patient's age. Electronically Signed   By: Duanne Guess M.D.   On: 07/20/2019 14:50   US Venous Img Lower Unilateral Left  Result Date: 07/20/2019 CLINICAL DATA:  53 year old with left lower extremity pain and swelling. EXAM: LEFT LOWER EXTREMITY VENOUS DOPPLER ULTRASOUND TECHNIQUE: Gray-scale sonography with graded compression, as well as color Doppler and duplex ultrasound were performed to evaluate the lower extremity deep venous systems from the level of the common femoral vein and including the common femoral, femoral, profunda femoral, popliteal and calf veins including the posterior tibial, peroneal and gastrocnemius veins when visible. The superficial great saphenous vein was also interrogated. Spectral Doppler was utilized to evaluate flow at rest and with distal augmentation maneuvers in the common femoral, femoral and popliteal veins. COMPARISON:  None. FINDINGS: Contralateral Common Femoral Vein: Respiratory phasicity is normal and symmetric with the symptomatic side. No evidence of thrombus. Normal compressibility. Common Femoral Vein: No evidence of thrombus. Normal compressibility, respiratory phasicity and response to augmentation. Saphenofemoral Junction: No evidence of thrombus.  Normal compressibility and flow on color Doppler imaging. Profunda Femoral Vein: No evidence of thrombus. Normal compressibility and flow on color Doppler imaging. Femoral Vein: No evidence of thrombus. Normal compressibility, respiratory phasicity and response to augmentation. Popliteal Vein: No evidence of thrombus. Normal compressibility, respiratory phasicity and response to augmentation. Calf Veins: Visualized left deep calf veins are patent without thrombus. Other Findings:  None. IMPRESSION: Negative for deep venous thrombosis in left lower extremity. Electronically Signed   By: Richarda Overlie M.D.   On: 07/20/2019 16:34   Dg Chest Portable 1  View  Result Date: 07/20/2019 CLINICAL DATA:  Weakness, hypertension EXAM: PORTABLE CHEST 1 VIEW COMPARISON:  04/16/2019 FINDINGS: The heart size and mediastinal contours are stable. Both lungs are clear. The visualized skeletal structures are unremarkable. IMPRESSION: No active disease.  No acute cardiopulmonary findings. Electronically Signed   By: Duanne Guess M.D.   On: 07/20/2019 14:47   Dg Femur Min 2 Views Left  Result Date: 07/20/2019 CLINICAL DATA:  Left leg pain EXAM: LEFT FEMUR 2 VIEWS; LEFT TIBIA AND FIBULA - 2 VIEW COMPARISON:  None. FINDINGS: There is no evidence of fracture or other focal bone lesions. Joint spaces of the left hip and ankle appear maintained. Mild joint space narrowing at the left knee. Mild diffuse soft tissue prominence. Scattered vascular calcifications. Soft tissues are unremarkable. IMPRESSION: 1. No acute osseous abnormality of the left femur or tibia-fibula. 2. Diffuse lower extremity soft tissue prominence, nonspecific. 3. Vascular calcifications, greater than expected for patient's age. Electronically Signed   By: Duanne Guess M.D.   On: 07/20/2019 14:50   ____________________________________________   PROCEDURES  Procedure(s) performed (including Critical Care):  Procedures  ____________________________________________   INITIAL IMPRESSION / ASSESSMENT AND PLAN / ED COURSE  As part of my medical decision making, I reviewed the following data within the electronic MEDICAL RECORD NUMBER Notes from prior ED visits and [place] Controlled Substance Database  53 year old female with her husband presents with complaint of weakness and left lower extremity pain.  Patient states that she fell on her left knee several days ago.  She states she has continued to have some pain in her left lower extremity.  Husband gave a history of patient walking while physical therapy is in the house and after they leave she sits on the sofa watching TV shows.  Patient is morbidly obese which also increases her chance of a DVT in her left leg.  X-rays of her left lower extremity were negative for acute bony injury.  Venous ultrasound was negative for DVT.  Labs were unremarkable but urinalysis did show that she has a UTI.  Patient was made aware that we would give some IV antibiotics while she was in the ED since she did have an IV.  Patient however took her IV out.  IV access was extremely hard and the IV team had to start her IV initially anyway.  Patient was given a Bactrim DS while in the ED.  Husband is aware that the remaining antibiotics were sent to her pharmacy.  She was encouraged to get up and walk frequently even if it is during commercials while watching TV.  Husband is aware that patient needs to follow-up with her PCP and also to make sure that her UTI has completely cleared. ____________________________________________   FINAL CLINICAL IMPRESSION(S) / ED DIAGNOSES  Final diagnoses:  Urinary tract infection without hematuria, site unspecified  Weakness     ED Discharge Orders         Ordered    sulfamethoxazole-trimethoprim (BACTRIM DS) 800-160 MG tablet  2 times daily     07/20/19 1643           Note:  This document was prepared using Dragon voice recognition software and may  include unintentional dictation errors.    Tommi Rumps, PA-C 07/20/19 1752    Sharman Cheek, MD 07/21/19 1351

## 2019-07-20 NOTE — ED Notes (Signed)
Patient in xray 

## 2019-07-20 NOTE — Discharge Instructions (Addendum)
Call make a follow-up appointment with your primary care provider for reevaluation and also to have your urine rechecked to make sure that the infection has cleared.  Increase fluids.  Get up frequently and walk as we have already discussed.  Sitting for long periods of time increase your chances for blood clots and increased weakness.  This also will decrease the strength in your legs if you sit for long periods of time or not walking at all.  You were given 1 pill of your antibiotic while you are in the ED.  The rest of your pills were sent to your pharmacy.  You will need to take the entire 10-day course.

## 2019-07-20 NOTE — ED Triage Notes (Signed)
Pt to ED via POV c/o bilateral leg pain x 5 days. Pt states that she is having trouble walking due to the pain. Pt states that her legs feel achy. Pt denies any other symptoms. Pt is in NAD at this time.

## 2019-07-20 NOTE — ED Notes (Signed)
Patients soiled brief changed to dry one brought from home. Patient helped into pants and wheelchair for discharge

## 2019-07-20 NOTE — ED Notes (Signed)
Patient arrived in two soiled briefs. Patients brief changed and pants placed in patient belongings bag. Patient pulled up in bed and covered with a sheet

## 2019-07-21 ENCOUNTER — Telehealth: Payer: Self-pay | Admitting: Emergency Medicine

## 2019-07-21 LAB — PATHOLOGIST SMEAR REVIEW

## 2019-07-21 NOTE — Telephone Encounter (Addendum)
Called patient due to abnormal cbc and dr Joni Fears recommends she follow up with hematology dr Grayland Ormond [phone].  I left message asking patient to call me.   0940am--she has not called me back. I did notify her pcp via the receptionist at [facility].  She will inform dr Clide Deutscher of abnormal cbc and recommendation that patient see dr Grayland Ormond or other hematologist.  THey will also reach out to the patient again, as they are aware she has not called me back.

## 2019-07-23 ENCOUNTER — Inpatient Hospital Stay: Payer: BC Managed Care – PPO

## 2019-07-23 ENCOUNTER — Inpatient Hospital Stay
Admission: EM | Admit: 2019-07-23 | Discharge: 2019-07-25 | DRG: 064 | Disposition: A | Discharge status: Home Health | Payer: BC Managed Care – PPO | Attending: Internal Medicine | Admitting: Internal Medicine

## 2019-07-23 ENCOUNTER — Encounter: Payer: Self-pay | Admitting: Emergency Medicine

## 2019-07-23 ENCOUNTER — Emergency Department: Payer: BC Managed Care – PPO

## 2019-07-23 ENCOUNTER — Other Ambulatory Visit: Payer: Self-pay

## 2019-07-23 DIAGNOSIS — G9341 Metabolic encephalopathy: Secondary | ICD-10-CM | POA: Diagnosis present

## 2019-07-23 DIAGNOSIS — Z8673 Personal history of transient ischemic attack (TIA), and cerebral infarction without residual deficits: Secondary | ICD-10-CM

## 2019-07-23 DIAGNOSIS — Z8249 Family history of ischemic heart disease and other diseases of the circulatory system: Secondary | ICD-10-CM

## 2019-07-23 DIAGNOSIS — G40909 Epilepsy, unspecified, not intractable, without status epilepticus: Secondary | ICD-10-CM | POA: Diagnosis present

## 2019-07-23 DIAGNOSIS — R296 Repeated falls: Secondary | ICD-10-CM | POA: Diagnosis present

## 2019-07-23 DIAGNOSIS — E1165 Type 2 diabetes mellitus with hyperglycemia: Secondary | ICD-10-CM | POA: Diagnosis present

## 2019-07-23 DIAGNOSIS — R32 Unspecified urinary incontinence: Secondary | ICD-10-CM | POA: Diagnosis present

## 2019-07-23 DIAGNOSIS — E785 Hyperlipidemia, unspecified: Secondary | ICD-10-CM | POA: Diagnosis present

## 2019-07-23 DIAGNOSIS — J309 Allergic rhinitis, unspecified: Secondary | ICD-10-CM | POA: Diagnosis present

## 2019-07-23 DIAGNOSIS — F329 Major depressive disorder, single episode, unspecified: Secondary | ICD-10-CM | POA: Diagnosis present

## 2019-07-23 DIAGNOSIS — I248 Other forms of acute ischemic heart disease: Secondary | ICD-10-CM | POA: Diagnosis present

## 2019-07-23 DIAGNOSIS — Z833 Family history of diabetes mellitus: Secondary | ICD-10-CM

## 2019-07-23 DIAGNOSIS — I10 Essential (primary) hypertension: Secondary | ICD-10-CM | POA: Diagnosis present

## 2019-07-23 DIAGNOSIS — R569 Unspecified convulsions: Secondary | ICD-10-CM

## 2019-07-23 DIAGNOSIS — I6381 Other cerebral infarction due to occlusion or stenosis of small artery: Principal | ICD-10-CM | POA: Diagnosis present

## 2019-07-23 DIAGNOSIS — T368X5A Adverse effect of other systemic antibiotics, initial encounter: Secondary | ICD-10-CM | POA: Diagnosis present

## 2019-07-23 DIAGNOSIS — R7989 Other specified abnormal findings of blood chemistry: Secondary | ICD-10-CM | POA: Diagnosis present

## 2019-07-23 DIAGNOSIS — Z7982 Long term (current) use of aspirin: Secondary | ICD-10-CM

## 2019-07-23 DIAGNOSIS — R29710 NIHSS score 10: Secondary | ICD-10-CM | POA: Diagnosis present

## 2019-07-23 DIAGNOSIS — E1151 Type 2 diabetes mellitus with diabetic peripheral angiopathy without gangrene: Secondary | ICD-10-CM | POA: Diagnosis present

## 2019-07-23 DIAGNOSIS — I639 Cerebral infarction, unspecified: Secondary | ICD-10-CM

## 2019-07-23 DIAGNOSIS — R778 Other specified abnormalities of plasma proteins: Secondary | ICD-10-CM | POA: Diagnosis present

## 2019-07-23 DIAGNOSIS — N179 Acute kidney failure, unspecified: Secondary | ICD-10-CM | POA: Diagnosis present

## 2019-07-23 DIAGNOSIS — N390 Urinary tract infection, site not specified: Secondary | ICD-10-CM | POA: Diagnosis present

## 2019-07-23 DIAGNOSIS — I69354 Hemiplegia and hemiparesis following cerebral infarction affecting left non-dominant side: Secondary | ICD-10-CM

## 2019-07-23 DIAGNOSIS — E119 Type 2 diabetes mellitus without complications: Secondary | ICD-10-CM

## 2019-07-23 DIAGNOSIS — N3000 Acute cystitis without hematuria: Secondary | ICD-10-CM | POA: Diagnosis not present

## 2019-07-23 DIAGNOSIS — Z79899 Other long term (current) drug therapy: Secondary | ICD-10-CM

## 2019-07-23 DIAGNOSIS — Z20828 Contact with and (suspected) exposure to other viral communicable diseases: Secondary | ICD-10-CM | POA: Diagnosis present

## 2019-07-23 DIAGNOSIS — Z794 Long term (current) use of insulin: Secondary | ICD-10-CM | POA: Diagnosis not present

## 2019-07-23 DIAGNOSIS — I6389 Other cerebral infarction: Secondary | ICD-10-CM | POA: Diagnosis not present

## 2019-07-23 DIAGNOSIS — W19XXXA Unspecified fall, initial encounter: Secondary | ICD-10-CM

## 2019-07-23 LAB — URINE CULTURE
Culture: >=100000 — AB
Special Requests: NORMAL

## 2019-07-23 LAB — BASIC METABOLIC PANEL
Anion gap: 11 (ref 5–15)
BUN: 29 mg/dL — ABNORMAL HIGH (ref 6–20)
CO2: 26 mmol/L (ref 22–32)
Calcium: 8.9 mg/dL (ref 8.9–10.3)
Chloride: 104 mmol/L (ref 98–111)
Creatinine, Ser: 1.59 mg/dL — ABNORMAL HIGH (ref 0.44–1.00)
GFR calc Af Amer: 43 mL/min — ABNORMAL LOW (ref >60)
GFR calc non Af Amer: 37 mL/min — ABNORMAL LOW (ref >60)
Glucose, Bld: 98 mg/dL (ref 70–99)
Potassium: 3.7 mmol/L (ref 3.5–5.1)
Sodium: 141 mmol/L (ref 135–145)

## 2019-07-23 LAB — URINALYSIS, COMPLETE (UACMP) WITH MICROSCOPIC
Bilirubin Urine: NEGATIVE
Glucose, UA: NEGATIVE mg/dL
Hgb urine dipstick: NEGATIVE
Ketones, ur: NEGATIVE mg/dL
Nitrite: POSITIVE — AB
Protein, ur: NEGATIVE mg/dL
Specific Gravity, Urine: 1.018 (ref 1.005–1.030)
pH: 6 (ref 5.0–8.0)

## 2019-07-23 LAB — HEPATIC FUNCTION PANEL
ALT: 13 U/L (ref 0–44)
AST: 26 U/L (ref 15–41)
Albumin: 3.7 g/dL (ref 3.5–5.0)
Alkaline Phosphatase: 37 U/L — ABNORMAL LOW (ref 38–126)
Bilirubin, Direct: <0.1 mg/dL (ref 0.0–0.2)
Total Bilirubin: 0.7 mg/dL (ref 0.3–1.2)
Total Protein: 7.4 g/dL (ref 6.5–8.1)

## 2019-07-23 LAB — APTT: aPTT: 31 seconds (ref 24–36)

## 2019-07-23 LAB — SARS CORONAVIRUS 2 (TAT 6-24 HRS): SARS Coronavirus 2: NEGATIVE

## 2019-07-23 LAB — BRAIN NATRIURETIC PEPTIDE: B Natriuretic Peptide: 34 pg/mL (ref 0.0–100.0)

## 2019-07-23 LAB — GLUCOSE, CAPILLARY: Glucose-Capillary: 83 mg/dL (ref 70–99)

## 2019-07-23 LAB — CBC
HCT: 35.2 % — ABNORMAL LOW (ref 36.0–46.0)
Hemoglobin: 10.9 g/dL — ABNORMAL LOW (ref 12.0–15.0)
MCH: 19.7 pg — ABNORMAL LOW (ref 26.0–34.0)
MCHC: 31 g/dL (ref 30.0–36.0)
MCV: 63.8 fL — ABNORMAL LOW (ref 80.0–100.0)
Platelets: 171 10*3/uL (ref 150–400)
RBC: 5.52 MIL/uL — ABNORMAL HIGH (ref 3.87–5.11)
RDW: 19.4 % — ABNORMAL HIGH (ref 11.5–15.5)
WBC: 7.7 10*3/uL (ref 4.0–10.5)
nRBC: 0 % (ref 0.0–0.2)

## 2019-07-23 LAB — PROTIME-INR
INR: 1 (ref 0.8–1.2)
Prothrombin Time: 13.5 seconds (ref 11.4–15.2)

## 2019-07-23 LAB — POCT PREGNANCY, URINE: Preg Test, Ur: NEGATIVE

## 2019-07-23 LAB — TROPONIN I (HIGH SENSITIVITY): Troponin I (High Sensitivity): 1313 ng/L (ref <18)

## 2019-07-23 LAB — VALPROIC ACID LEVEL: Valproic Acid Lvl: 64 ug/mL (ref 50.0–100.0)

## 2019-07-23 LAB — CK: Total CK: 242 U/L — ABNORMAL HIGH (ref 38–234)

## 2019-07-23 MED ORDER — SODIUM CHLORIDE 0.9% FLUSH: 3.0000 mL | Freq: Once | INTRAVENOUS | Status: DC

## 2019-07-23 MED ORDER — SENNOSIDES-DOCUSATE SODIUM 8.6-50 MG PO TABS
1.0000 | ORAL_TABLET | Freq: Every evening | ORAL | Status: DC | PRN
  Filled 2019-07-23: qty 1

## 2019-07-23 MED ORDER — ASPIRIN 300 MG RE SUPP
300.0000 mg | Freq: Every day | RECTAL | Status: DC
  Filled 2019-07-23: qty 1

## 2019-07-23 MED ORDER — SODIUM CHLORIDE 0.9 % IV SOLN
1.0000 g | Freq: Once | INTRAVENOUS | Status: AC
  Administered 2019-07-23: 1 g via INTRAVENOUS
  Filled 2019-07-23: qty 10

## 2019-07-23 MED ORDER — SODIUM CHLORIDE 0.9 % IV SOLN
1.0000 g | INTRAVENOUS | Status: DC
  Administered 2019-07-24 – 2019-07-25 (×2): 1 g via INTRAVENOUS
  Filled 2019-07-23: qty 10
  Filled 2019-07-23 (×2): qty 1

## 2019-07-23 MED ORDER — DARIFENACIN HYDROBROMIDE ER 7.5 MG PO TB24
7.5000 mg | ORAL_TABLET | Freq: Every day | ORAL | Status: DC
  Administered 2019-07-24 – 2019-07-25 (×2): 7.5 mg via ORAL
  Filled 2019-07-23 (×2): qty 1

## 2019-07-23 MED ORDER — ASPIRIN 81 MG PO CHEW
324.0000 mg | CHEWABLE_TABLET | Freq: Once | ORAL | Status: AC
  Administered 2019-07-23: 324 mg via ORAL
  Filled 2019-07-23: qty 4

## 2019-07-23 MED ORDER — ACETAMINOPHEN 650 MG RE SUPP
650.0000 mg | RECTAL | Status: DC | PRN
  Filled 2019-07-23: qty 1

## 2019-07-23 MED ORDER — HEPARIN BOLUS VIA INFUSION: 4000.0000 [IU] | Freq: Once | INTRAVENOUS | Status: DC

## 2019-07-23 MED ORDER — STROKE: EARLY STAGES OF RECOVERY BOOK: Freq: Once | Status: DC

## 2019-07-23 MED ORDER — GABAPENTIN 300 MG PO CAPS
300.0000 mg | ORAL_CAPSULE | Freq: Every day | ORAL | Status: DC
  Administered 2019-07-24 – 2019-07-25 (×2): 300 mg via ORAL
  Filled 2019-07-23 (×2): qty 1

## 2019-07-23 MED ORDER — HEPARIN (PORCINE) 25000 UT/250ML-% IV SOLN: 900.0000 [IU]/h | INTRAVENOUS | Status: DC

## 2019-07-23 MED ORDER — INSULIN ASPART 100 UNIT/ML ~~LOC~~ SOLN
0.0000 [IU] | Freq: Three times a day (TID) | SUBCUTANEOUS | Status: DC
  Administered 2019-07-24 (×2): 5 [IU] via SUBCUTANEOUS
  Administered 2019-07-25: 2 [IU] via SUBCUTANEOUS
  Administered 2019-07-25: 3 [IU] via SUBCUTANEOUS
  Filled 2019-07-23 (×4): qty 1

## 2019-07-23 MED ORDER — GABAPENTIN 300 MG PO CAPS
600.0000 mg | ORAL_CAPSULE | Freq: Every day | ORAL | Status: DC
  Administered 2019-07-24: 600 mg via ORAL
  Filled 2019-07-23 (×2): qty 2

## 2019-07-23 MED ORDER — ENOXAPARIN SODIUM 40 MG/0.4ML ~~LOC~~ SOLN
40.0000 mg | Freq: Two times a day (BID) | SUBCUTANEOUS | Status: DC
  Administered 2019-07-24 – 2019-07-25 (×3): 40 mg via SUBCUTANEOUS
  Filled 2019-07-23 (×5): qty 0.4

## 2019-07-23 MED ORDER — ATORVASTATIN CALCIUM 20 MG PO TABS
40.0000 mg | ORAL_TABLET | Freq: Every day | ORAL | Status: DC
  Administered 2019-07-24: 40 mg via ORAL
  Filled 2019-07-23 (×3): qty 2

## 2019-07-23 MED ORDER — DIVALPROEX SODIUM 250 MG PO DR TAB
250.0000 mg | DELAYED_RELEASE_TABLET | Freq: Every day | ORAL | Status: DC
  Administered 2019-07-24 – 2019-07-25 (×2): 250 mg via ORAL
  Filled 2019-07-23 (×2): qty 1

## 2019-07-23 MED ORDER — DIVALPROEX SODIUM 500 MG PO DR TAB
500.0000 mg | DELAYED_RELEASE_TABLET | Freq: Every day | ORAL | Status: DC
  Administered 2019-07-24: 500 mg via ORAL
  Filled 2019-07-23 (×4): qty 1

## 2019-07-23 MED ORDER — ACETAMINOPHEN 160 MG/5ML PO SOLN
650.0000 mg | ORAL | Status: DC | PRN
  Filled 2019-07-23: qty 20.3

## 2019-07-23 MED ORDER — INSULIN ASPART 100 UNIT/ML ~~LOC~~ SOLN
0.0000 [IU] | Freq: Every day | SUBCUTANEOUS | Status: DC
  Administered 2019-07-24: 2 [IU] via SUBCUTANEOUS
  Filled 2019-07-23: qty 1

## 2019-07-23 MED ORDER — ACETAMINOPHEN 325 MG PO TABS: 650.0000 mg | ORAL_TABLET | ORAL | Status: DC | PRN

## 2019-07-23 MED ORDER — ASPIRIN EC 325 MG PO TBEC
325.0000 mg | DELAYED_RELEASE_TABLET | Freq: Every day | ORAL | Status: DC
  Administered 2019-07-24 – 2019-07-25 (×2): 325 mg via ORAL
  Filled 2019-07-23 (×2): qty 1

## 2019-07-23 NOTE — ED Notes (Signed)
Lab informed to add trop to labs that were previously sent

## 2019-07-23 NOTE — ED Notes (Signed)
Pt ambulated to toilet with 2 person assist, daughter at bedside

## 2019-07-23 NOTE — ED Notes (Signed)
Called lab to check on 2nd trop that has not results, Levada Dy to check on this

## 2019-07-23 NOTE — Progress Notes (Signed)
Thoroughly assessed pt with ultrasound both forearm and upper arm.Both forearm have a tiny veins. Both upper arms,the cephalic,brachial and basilic vein not visible in ultrasound.

## 2019-07-23 NOTE — H&P (Signed)
[facility] History and Physical   Patient: [name] EAV:[id number]   [facility]B: 12/25/1965   DOA: 07/23/2019   DOS: 07/23/2019   DOS: the patient was seen and examined on 07/23/2019  Patient coming from: The patient is coming from Home  Chief Complaint: Fall and confusion  HPI: [name] is a 53 y.o. female with Past medical history of depression, type II DM, seizures, HTN, CVA with residual left-sided weakness, HLD, incontinence of urine. Patient was brought in by family to the hospital because of a fall and confusion. Reportedly patient was seen recently for a UTI and was on Bactrim. Patient has been having generalized weakness as well as difficulty speaking for last 1 week and off and on noticing some confusion. Patient had a fall the other day as her legs were giving away. This morning she had another fall when she fell that she was not able to lift her leg but her husband thought she should be able to walk and made her walk. She denies having any head injury or neck injury.  Denies any passing out episode. Denies any chest pain chest tightness denies any shortness of breath denies any cough denies any fever.  No burning urination.  No diarrhea no constipation. Patient is compliant with all her medications. Her sugars has been running anywhere from 200-300 on the high side to 50s and 60s on the low side for last couple of days. There is also reported poor p.o. intake. No other change in medications reported.  ED Course: Presents with a fall and generalized weakness. Work-up shows that she has possible UTI as well as elevated troponin. There is also concern for CHF. Patient was referred for admission for UTI and CHF.  Work-up that I performed identified acute lacunar infarct as well.  At her baseline ambulates with assistance independent for most of her ADL;  manages her medication on her own.  Review of Systems: as mentioned in the  history of present illness.  All other systems reviewed and are negative.  Past Medical History:  Diagnosis Date  . Allergic rhinitis   . Chickenpox   . Depression   . Diabetes (HCC)   . Epilepsy (HCC)   . Headache   . HTN (hypertension)   . Hyperlipidemia   . Seizures (HCC)   . Stroke (HCC)   . Urinary incontinence    Past Surgical History:  Procedure Laterality Date  . CESAREAN SECTION    . INCISION AND DRAINAGE Right 05/13/2016   Procedure: INCISION AND DRAINAGE;  Surgeon: Gwyneth RevelsJustin Fowler, DPM;  Location: [facility] ORS;  Service: Podiatry;  Laterality: Right;   Social History:  reports that she has never smoked. She has never used smokeless tobacco. She reports that she does not drink alcohol or use drugs.  No Known Allergies  Family history reviewed and not pertinent Family History  Problem Relation Age of Onset  . Hypertension Mother   . Diabetes Mellitus II Mother   . Hypertension Father   . Pancreatic cancer Father      Prior to Admission medications   Medication Sig Start Date End Date Taking? Authorizing Provider  atorvastatin (LIPITOR) 40 MG tablet Take 1 tablet (40 mg total) by mouth daily at 6 PM. 08/16/16  Yes Ghimire, Werner LeanShanker M, MD  divalproex (DEPAKOTE) 250 MG DR tablet Take 250-500 mg by mouth 2 (two) times daily. Take one tablet (250 mg) by mouth every morning and two tablets (500 mg) at  bedtime 07/13/19  Yes [provider]  gabapentin (NEURONTIN) 300 MG capsule Take 300-900 mg by mouth 2 (two) times daily. Take one capsule (300 mg) by mouth every morning and two capsules (600 mg) at bedtime. (May take an additional 300 mg at bedtime as needed) 07/13/19  Yes [provider]  insulin aspart (NOVOLOG FLEXPEN) 100 UNIT/ML FlexPen Inject 10 Units into the skin 3 (three) times daily with meals. Patient taking differently: Inject 15 Units into the skin 3 (three) times daily with meals.  08/16/16  Yes Ghimire, Henreitta Leber, MD  Insulin Detemir (LEVEMIR  FLEXPEN) 100 UNIT/ML Pen Inject 40 Units into the skin daily at 10 pm. Patient taking differently: Inject 55 Units into the skin daily at 10 pm.  08/16/16  Yes Ghimire, Henreitta Leber, MD  lisinopril-hydrochlorothiazide (PRINZIDE,ZESTORETIC) 20-12.5 MG tablet Take 1 tablet by mouth daily. 08/16/16  Yes Ghimire, Henreitta Leber, MD  metFORMIN (GLUCOPHAGE) 500 MG tablet Take 1 tablet (500 mg total) by mouth 2 (two) times daily with a meal. 08/16/16  Yes Ghimire, Henreitta Leber, MD  sulfamethoxazole-trimethoprim (BACTRIM DS) 800-160 MG tablet Take 1 tablet by mouth 2 (two) times daily. 07/20/19  Yes Letitia Neri L, PA-C  VESICARE 10 MG tablet Take 10 mg by mouth 2 (two) times daily. 05/26/19  Yes [provider]  etodolac (LODINE) 400 MG tablet Take 400 mg by mouth daily as needed.    [provider]  glipiZIDE (GLUCOTROL XL) 5 MG 24 hr tablet Take 1 tablet (5 mg total) by mouth daily. Patient not taking: Reported on 07/23/2019 08/16/16   Jonetta Osgood, MD  metoprolol tartrate (LOPRESSOR) 25 MG tablet Take 25 mg by mouth 2 (two) times a day.    [provider]    Physical Exam: Vitals:   07/23/19 1123 07/23/19 1320 07/23/19 1445  BP: 131/85 (!) 146/86   Pulse: 99 93 87  Resp: 14 19   Temp: 99.2 F (37.3 C)    TempSrc: Oral    SpO2: 92% 98% 97%  Weight: 120 kg    Height: 5\' 2"  (1.575 m)      General: alert and oriented to time, place, and person. Appear in moderate distress, affect appropriate Eyes: PERRL, Conjunctiva normal ENT: Oral Mucosa Clear, moist  Neck: difficult to assess  JVD, no Abnormal Mass Or lumps Cardiovascular: S1 and S2 Present, no Murmur, peripheral pulses symmetrical Respiratory: increased respiratory effort, Bilateral Air entry equal and Decreased, no signs of accessory muscle use, bilateral Crackles, no wheezes Abdomen: Bowel Sound present, Soft and no tenderness, no hernia Skin: no rashes  Extremities: bilateral  Pedal edema, no calf tenderness  Neurologic: mental status, alert and oriented x3, speech normal, PERLA, Motor strength reduced at left upper and left lower extremity, Sensation grossly normal to light touch, Reflex difficult to assess and Finger to nose test abnormal on left Gait not checked due to patient safety concerns  Data Reviewed: I have personally reviewed and interpreted labs, imaging as discussed below.  CBC: Recent Labs  Lab 07/20/19 1551 07/23/19 1128  WBC 10.0 7.7  NEUTROABS 6.7  --   HGB 9.9* 10.9*  HCT 31.8* 35.2*  MCV 63.2* 63.8*  PLT PLATELET CLUMPS NOTED ON SMEAR, UNABLE TO ESTIMATE 884   Basic Metabolic Panel: Recent Labs  Lab 07/20/19 1551 07/23/19 1128  NA 141 141  K 3.6 3.7  CL 103 104  CO2 24 26  GLUCOSE 74 98  BUN 23* 29*  CREATININE 1.06* 1.59*  CALCIUM 8.8* 8.9   GFR: Estimated Creatinine Clearance: 50.4 mL/min (A) (by C-G formula based on SCr of 1.59 mg/dL (H)). Liver Function Tests: Recent Labs  Lab 07/20/19 1551 07/23/19 1128  AST 21 26  ALT 11 13  ALKPHOS 34* 37*  BILITOT 1.0 0.7  PROT 7.0 7.4  ALBUMIN 3.6 3.7   No results for input(s): LIPASE, AMYLASE in the last 168 hours. No results for input(s): AMMONIA in the last 168 hours. Coagulation Profile: Recent Labs  Lab 07/23/19 1513  INR 1.0   Cardiac Enzymes: Recent Labs  Lab 07/23/19 1513  CKTOTAL 242*   BNP (last 3 results) No results for input(s): PROBNP in the last 8760 hours. HbA1C: No results for input(s): HGBA1C in the last 72 hours. CBG: Recent Labs  Lab 07/23/19 1131  GLUCAP 83   Lipid Profile: No results for input(s): CHOL, HDL, LDLCALC, TRIG, CHOLHDL, LDLDIRECT in the last 72 hours. Thyroid Function Tests: No results for input(s): TSH, T4TOTAL, FREET4, T3FREE, THYROIDAB in the last 72 hours. Anemia Panel: No results for input(s): VITAMINB12, FOLATE, FERRITIN, TIBC, IRON, RETICCTPCT in the last 72 hours. Urine analysis:    Component Value Date/Time   COLORURINE YELLOW (A)  07/23/2019 1208   APPEARANCEUR CLOUDY (A) 07/23/2019 1208   APPEARANCEUR Clear 12/27/2014 1806   LABSPEC 1.018 07/23/2019 1208   LABSPEC 1.035 12/27/2014 1806   PHURINE 6.0 07/23/2019 1208   GLUCOSEU NEGATIVE 07/23/2019 1208   GLUCOSEU >=500 12/27/2014 1806   HGBUR NEGATIVE 07/23/2019 1208   BILIRUBINUR NEGATIVE 07/23/2019 1208   BILIRUBINUR Negative 12/27/2014 1806   KETONESUR NEGATIVE 07/23/2019 1208   PROTEINUR NEGATIVE 07/23/2019 1208   NITRITE POSITIVE (A) 07/23/2019 1208   LEUKOCYTESUR MODERATE (A) 07/23/2019 1208   LEUKOCYTESUR Negative 12/27/2014 1806    Radiological Exams on Admission: Ct Head Wo Contrast  Result Date: 07/23/2019 CLINICAL DATA:  UTI, leg swelling EXAM: CT HEAD WITHOUT CONTRAST TECHNIQUE: Contiguous axial images were obtained from the base of the skull through the vertex without intravenous contrast. COMPARISON:  04/16/2019 FINDINGS: Brain: No evidence of acute infarction, hemorrhage, extra-axial collection, ventriculomegaly, or mass effect. 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 Generalized cerebral atrophy. Periventricular white matter low attenuation likely secondary to microangiopathy. Vascular: Cerebrovascular atherosclerotic calcifications are noted. Skull: Negative for fracture or focal lesion. Sinuses/Orbits: Visualized portions of the orbits are unremarkable. Visualized portions of the paranasal sinuses and mastoid air cells are unremarkable. Other: None. IMPRESSION: 1. No acute intracranial pathology. 2. 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 Electronically Signed   By: Elige Ko   On: 07/23/2019 13:17   Mr Brain Wo Contrast  Result Date: 07/23/2019 CLINICAL DATA:  53 year old female with unexplained altered mental status. Chronic ischemic disease. EXAM: MRI HEAD WITHOUT CONTRAST TECHNIQUE: Multiplanar, multiecho pulse sequences of the brain and surrounding structures were obtained without intravenous contrast.  COMPARISON:  Brain MRI 04/16/2019. Head CT earlier today. FINDINGS: Brain: 9 millimeter lacunar infarct with restricted diffusion in the left lentiform/external capsule (series 5, image 24). Mild T2 and FLAIR hyperintensity with no associated hemorrhage or mass effect. A superimposed chronic microhemorrhage of the posterior left lentiform was present on the prior exam. No other restricted diffusion. Chronic right hemisphere encephalomalacia involving the posterior right MCA and some of the right PCA territory. Chronic lacunar infarcts in the pons. Stable gray and white matter signal findings above aside. From the acute finding above. No midline shift, mass effect, evidence of mass lesion, ventriculomegaly, extra-axial collection or acute intracranial hemorrhage. Cervicomedullary junction  and pituitary are within normal limits. Vascular: Major intracranial vascular flow voids appear stable. Skull and upper cervical spine: Stable bone marrow signal. Upper cervical spine today obscured by motion. Sinuses/Orbits: Negative orbits. Paranasal sinuses are clear. Other: Mastoids remain clear. Negative scalp and face soft tissues. IMPRESSION: 1. Acute lacunar infarct in the left lentiform/external capsule. No acute hemorrhage or associated mass effect. 2. Underlying advanced chronic ischemic disease which is otherwise stable since July. Electronically Signed   By: Odessa Fleming M.D.   On: 07/23/2019 15:05   Dg Chest Portable 1 View  Result Date: 07/23/2019 CLINICAL DATA:  Weakness. EXAM: PORTABLE CHEST 1 VIEW COMPARISON:  07/20/2019. FINDINGS: Cardiomegaly with pulmonary venous congestion. No focal infiltrate. Interval resolution of basilar atelectasis with improved aeration of both lungs from prior exam. No pleural effusion or pneumothorax. Degenerative change thoracic spine. IMPRESSION: Cardiomegaly with pulmonary venous congestion. No focal infiltrate. Interval resolution of basilar atelectasis with improved aeration from  prior exam. Electronically Signed   By: Maisie Fus  Register   On: 07/23/2019 12:39   EKG: Independently reviewed. normal sinus rhythm, nonspecific ST and T waves changes. Echocardiogram: Reordered on 07/23/2019, limited  I reviewed all nursing notes, pharmacy notes, vitals, pertinent old records.  Assessment/Plan 1.  Acute lacunar infarct. Neurology consulted. Patient is on no antiplatelet medications at home. Currently will initiate with aspirin. Lipitor 40 mg we will continue that. MRI brain as well as echocardiogram hemoglobin A1c, lipid profile ordered. PT OT and speech therapy consult ordered. N.p.o. until cleared for stroke swallowing eval.  2.  Type 2 diabetes mellitus, uncontrolled with hyper and hypoglycemia.  Associated with chronic kidney disease. Check hemoglobin A1c. Continue with sliding scale insulin. Holding oral hypoglycemic agent including glipizide as well as Metformin. Also holding patient scheduled Levemir for now.  3.  Possible UTI. UA shows evidence of nitrite positive pyuria. We will treat with IV ceftriaxone. Monitor.  4.  History of seizure disorder. Patient is on Depakote at home. About Depakote level is actually currently normal. We will continue with Depakote for now.  5.  Acute kidney injury. Renal function worsening from baseline. Baseline serum creatinine 1.0-1.06.  Current serum creatinine 1.59. Likely secondary to Bactrim as well as ongoing use of lisinopril or just HCTZ combination in a patient who is having poor p.o. intake for at least last 1 week. I will be holding off on this medication and hopefully it will improve her renal function.  6.  Elevated troponin. Troponin levels were 1300. Likely demand ischemia from acute stroke as well as poor clearance from acute kidney injury. Repeat troponin is ordered. We will monitor.  7.  Recurrent falls. Likely multifactorial. CT scan as well as MRI were negative for any acute hemorrhage or acute  fracture. Monitor.  PT OT consulted.  8.  Essential hypertension. Holding lisinopril HCTZ. Allowing permissive hypertension in the setting of acute stroke.  9.  Urinary incontinence. Continue Vesicare for now.  Nutrition: NPO until stroke swallow clears DVT Prophylaxis: Subcutaneous Lovenox  Advance goals of care discussion: Full code   Consults: I personally Discussed with neurology   Family Communication: family was present at bedside, at the time of interview.  Opportunity was given to ask question and all questions were answered satisfactorily.  Disposition: Admitted as inpatient, telemetryunit. Likely to be discharged home, in 2-3 days.  I have discussed plan of care as described above with RN and patient/family.  Author: Lynden Oxford, MD [facility] 07/23/2019 7:23 PM   To reach  On-call, see care teams to locate the attending and reach out to them via [email or web address]. If 7PM-7AM, please contact night-coverage If you still have difficulty reaching the attending provider, please page the [facility] (Director on Call) for [facility] on amion for assistance.

## 2019-07-23 NOTE — ED Notes (Signed)
Urine sent to lab 

## 2019-07-23 NOTE — ED Notes (Signed)
purewick placed

## 2019-07-23 NOTE — ED Notes (Signed)
Dr. Malinda at bedside.  

## 2019-07-23 NOTE — ED Triage Notes (Signed)
Pt to ED via EMS from home c/o fall today while walking to bathroom, states she became dizzy.  Denies LOC, states landed on hands when she fell, denies pain at this time.

## 2019-07-23 NOTE — Consult Note (Signed)
Reason for Consult: Acute lacunar infarct in the left lentiform/external capsule Referring Physician: Hospitalist    HPI: [name] is an 53 y.o. 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 She states she had a bout of dizziness. She c/o weakness on both legs, no sz like activity, no visual disturbance, no HA,  Patient started on rocephin for UTI. Labs per EMR: Head ct: No acute intracranial pathology.Old right temporoparietal infarct with encephalomalacia. MRI: Acute lacunar infarct in the left lentiform/external capsule. No acute hemorrhage or associated mass effect.Underlying advanced chronic ischemic disease which is otherwise stable since July.  Past Medical History:  Diagnosis Date  . Allergic rhinitis   . Chickenpox   . Depression   . Diabetes (HCC)   . Epilepsy (HCC)   . Headache   . HTN (hypertension)   . Hyperlipidemia   . Seizures (HCC)   . Stroke (HCC)   . Urinary incontinence     Past Surgical History:  Procedure Laterality Date  . CESAREAN SECTION    . INCISION AND DRAINAGE Right 05/13/2016   Procedure: INCISION AND DRAINAGE;  Surgeon: Gwyneth Revels, DPM;  Location: [facility] ORS;  Service: Podiatry;  Laterality: Right;    Family History  Problem Relation Age of Onset  . Hypertension Mother   . Diabetes Mellitus II Mother   . Hypertension Father   . Pancreatic cancer Father     Social History:  reports that she has never smoked. She has never used smokeless tobacco. She reports that she does not drink alcohol or use drugs.  No Known Allergies  Medications: I have reviewed the patient's current medications.  ROS: *as per hpi Physical Examination: Blood pressure (!) 146/86, pulse 87, temperature 99.2 F (37.3 C), temperature source Oral,  resp. rate 19, height 5\' 2"  (1.575 m), weight 120 kg, SpO2 97 %.  Neurologic Examination Alert, awake, oriented x2, speech is nle, follows commands PERLA< EOMI, vff seems full, left facial, face sensation nle to touch, uvula/tongue midline She is 5/5 on RUEX. 4/5 on RLEX and LUEX. She is 3/5 on LLEX No sensory deficit appreciated to touch Left dysmetria to FN DTRs and gait not checked at time of evaluation  Results for orders placed or performed during the hospital encounter of 07/23/19 (from the past 48 hour(s))  Basic metabolic panel     Status: Abnormal   Collection Time: 07/23/19 11:28 AM  Result Value Ref Range   Sodium 141 135 - 145 mmol/L   Potassium 3.7 3.5 - 5.1 mmol/L   Chloride 104 98 - 111 mmol/L   CO2 26 22 - 32 mmol/L   Glucose, Bld 98 70 - 99 mg/dL   BUN 29 (H) 6 - 20 mg/dL   Creatinine, Ser 13/05/20 (H) 0.44 - 1.00 mg/dL   Calcium 8.9 8.9 - 0.10 mg/dL   GFR calc non Af Amer 37 (L) >60 mL/min   GFR calc Af Amer 43 (L) >60 mL/min   Anion gap 11 5 - 15    Comment: Performed at [facility], [address] [place], [place]  CBC     Status: Abnormal   Collection Time: 07/23/19 11:28 AM  Result Value Ref Range   WBC 7.7 4.0 - 10.5 K/uL   RBC 5.52 (H) 3.87 -  5.11 MIL/uL   Hemoglobin 10.9 (L) 12.0 - 15.0 g/dL   HCT 35.2 (L) 36.0 - 46.0 %   MCV 63.8 (L) 80.0 - 100.0 fL   MCH 19.7 (L) 26.0 - 34.0 pg   MCHC 31.0 30.0 - 36.0 g/dL   RDW 19.4 (H) 11.5 - 15.5 %   Platelets 171 150 - 400 K/uL   nRBC 0.0 0.0 - 0.2 %    Comment: Performed at [facility], [address] [place], [place] [address]  Hepatic function panel     Status: Abnormal   Collection Time: 07/23/19 11:28 AM  Result Value Ref Range   Total Protein 7.4 6.5 - 8.1 g/dL   Albumin 3.7 3.5 - 5.0 g/dL   AST 26 15 - 41 U/L   ALT 13 0 - 44 U/L   Alkaline Phosphatase 37 (L) 38 - 126 U/L   Total Bilirubin 0.7 0.3 - 1.2 mg/dL   Bilirubin, Direct <0.1 0.0 - 0.2 mg/dL   Indirect  Bilirubin NOT CALCULATED 0.3 - 0.9 mg/dL    Comment: Performed at [facility], [address] [place], [place] [address]  Troponin I (High Sensitivity)     Status: Abnormal   Collection Time: 07/23/19 11:28 AM  Result Value Ref Range   Troponin I (High Sensitivity) 1,313 (HH) <18 ng/L    Comment: CRITICAL RESULT CALLED TO, READ BACK BY AND VERIFIED WITH JESSICA FULCHER AT 6789 ON 07/23/2019 JJB (NOTE) Elevated high sensitivity troponin I (hsTnI) values and significant  changes across serial measurements may suggest ACS but many other  chronic and acute conditions are known to elevate hsTnI results.  Refer to the Links section for chest pain algorithms and additional  guidance. Performed at [facility], [address] [place], [place] [address]   Brain natriuretic peptide     Status: None   Collection Time: 07/23/19 11:28 AM  Result Value Ref Range   B Natriuretic Peptide 34.0 0.0 - 100.0 pg/mL    Comment: Performed at [facility], [address] [place], [place] [address]  Glucose, capillary     Status: None   Collection Time: 07/23/19 11:31 AM  Result Value Ref Range   Glucose-Capillary 83 70 - 99 mg/dL  Urinalysis, Complete w Microscopic     Status: Abnormal   Collection Time: 07/23/19 12:08 PM  Result Value Ref Range   Color, Urine YELLOW (A) YELLOW   APPearance CLOUDY (A) CLEAR   Specific Gravity, Urine 1.018 1.005 - 1.030   pH 6.0 5.0 - 8.0   Glucose, UA NEGATIVE NEGATIVE mg/dL   Hgb urine dipstick NEGATIVE NEGATIVE   Bilirubin Urine NEGATIVE NEGATIVE   Ketones, ur NEGATIVE NEGATIVE mg/dL   Protein, ur NEGATIVE NEGATIVE mg/dL   Nitrite POSITIVE (A) NEGATIVE   Leukocytes,Ua MODERATE (A) NEGATIVE   RBC / HPF 6-10 0 - 5 RBC/hpf   WBC, UA 6-10 0 - 5 WBC/hpf   Bacteria, UA MANY (A) NONE SEEN   Squamous Epithelial / LPF 11-20 0 - 5   Mucus PRESENT     Comment: Performed at [facility], [address] [place], [place] [address]   Pregnancy, urine POC     Status: None   Collection Time: 07/23/19 12:13 PM  Result Value Ref Range   Preg Test, Ur NEGATIVE NEGATIVE    Comment:        THE SENSITIVITY OF THIS METHODOLOGY IS >24 mIU/mL   CK     Status:  Abnormal   Collection Time: 07/23/19  3:13 PM  Result Value Ref Range   Total CK 242 (H) 38 - 234 U/L    Comment: Performed at [facility], [address] [place], [place] [address]  Valproic acid level     Status: None   Collection Time: 07/23/19  3:13 PM  Result Value Ref Range   Valproic Acid Lvl 64 50.0 - 100.0 ug/mL    Comment: Performed at [facility], [address] [place], [place] [address]  APTT     Status: None   Collection Time: 07/23/19  3:13 PM  Result Value Ref Range   aPTT 31 24 - 36 seconds    Comment: Performed at [facility], [address] [place], [place] [address]  Protime-INR     Status: None   Collection Time: 07/23/19  3:13 PM  Result Value Ref Range   Prothrombin Time 13.5 11.4 - 15.2 seconds   INR 1.0 0.8 - 1.2    Comment: (NOTE) INR goal varies based on device and disease states. Performed at [facility], [address] [place], [place] [address]     Recent Results (from the past 240 hour(s))  Urine culture     Status: Abnormal   Collection Time: 07/20/19  3:07 PM   Specimen: Urine, Random  Result Value Ref Range Status   Specimen Description   Final    URINE, RANDOM Performed at [facility], [address] [place], [place] [address]    Special Requests   Final    Normal Performed at [facility], [address] [place], [place] [address]    Culture >=100,000 COLONIES/mL PROTEUS MIRABILIS (A)  Final   Report Status 07/23/2019 FINAL  Final   Organism ID, Bacteria PROTEUS MIRABILIS (A)  Final      Susceptibility   Proteus mirabilis - MIC*    AMPICILLIN <=2 SENSITIVE Sensitive     CEFAZOLIN <=4 SENSITIVE Sensitive     CEFTRIAXONE <=1 SENSITIVE  Sensitive     CIPROFLOXACIN <=0.25 SENSITIVE Sensitive     GENTAMICIN <=1 SENSITIVE Sensitive     IMIPENEM 1 SENSITIVE Sensitive     NITROFURANTOIN 128 RESISTANT Resistant     TRIMETH/SULFA <=20 SENSITIVE Sensitive     AMPICILLIN/SULBACTAM <=2 SENSITIVE Sensitive     PIP/TAZO <=4 SENSITIVE Sensitive     * >=100,000 COLONIES/mL PROTEUS MIRABILIS    Ct Head Wo Contrast  Result Date: 07/23/2019 CLINICAL DATA:  UTI, leg swelling EXAM: CT HEAD WITHOUT CONTRAST TECHNIQUE: Contiguous axial images were obtained from the base of the skull through the vertex without intravenous contrast. COMPARISON:  04/16/2019 FINDINGS: Brain: No evidence of acute infarction, hemorrhage, extra-axial collection, ventriculomegaly, or mass effect. 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 Generalized cerebral atrophy. Periventricular white matter low attenuation likely secondary to microangiopathy. Vascular: Cerebrovascular atherosclerotic calcifications are noted. Skull: Negative for fracture or focal lesion. Sinuses/Orbits: Visualized portions of the orbits are unremarkable. Visualized portions of the paranasal sinuses and mastoid air cells are unremarkable. Other: None. IMPRESSION: 1. No acute intracranial pathology. 2. 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 Electronically Signed   By: Elige Ko   On: 07/23/2019 13:17   Mr Brain Wo Contrast  Result Date: 07/23/2019 CLINICAL DATA:  53 year old female with unexplained altered mental status. Chronic ischemic disease. EXAM: MRI HEAD WITHOUT CONTRAST TECHNIQUE: Multiplanar, multiecho pulse sequences of the brain and surrounding structures were obtained without intravenous contrast. COMPARISON:  Brain MRI 04/16/2019. Head  CT earlier today. FINDINGS: Brain: 9 millimeter lacunar infarct with restricted diffusion in the left lentiform/external capsule (series 5, image 24). Mild T2 and FLAIR hyperintensity with no associated hemorrhage or mass  effect. A superimposed chronic microhemorrhage of the posterior left lentiform was present on the prior exam. No other restricted diffusion. Chronic right hemisphere encephalomalacia involving the posterior right MCA and some of the right PCA territory. Chronic lacunar infarcts in the pons. Stable gray and white matter signal findings above aside. From the acute finding above. No midline shift, mass effect, evidence of mass lesion, ventriculomegaly, extra-axial collection or acute intracranial hemorrhage. Cervicomedullary junction and pituitary are within normal limits. Vascular: Major intracranial vascular flow voids appear stable. Skull and upper cervical spine: Stable bone marrow signal. Upper cervical spine today obscured by motion. Sinuses/Orbits: Negative orbits. Paranasal sinuses are clear. Other: Mastoids remain clear. Negative scalp and face soft tissues. IMPRESSION: 1. Acute lacunar infarct in the left lentiform/external capsule. No acute hemorrhage or associated mass effect. 2. Underlying advanced chronic ischemic disease which is otherwise stable since July. Electronically Signed   By: Odessa FlemingH  Hall M.D.   On: 07/23/2019 15:05   Dg Chest Portable 1 View  Result Date: 07/23/2019 CLINICAL DATA:  Weakness. EXAM: PORTABLE CHEST 1 VIEW COMPARISON:  07/20/2019. FINDINGS: Cardiomegaly with pulmonary venous congestion. No focal infiltrate. Interval resolution of basilar atelectasis with improved aeration of both lungs from prior exam. No pleural effusion or pneumothorax. Degenerative change thoracic spine. IMPRESSION: Cardiomegaly with pulmonary venous congestion. No focal infiltrate. Interval resolution of basilar atelectasis with improved aeration from prior exam. 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 - dvt prophylaxis - Will follow up with you 07/23/2019, 4:23 PM

## 2019-07-23 NOTE — Consult Note (Addendum)
ANTICOAGULATION CONSULT NOTE - Initial Consult  UPDATE: Not starting heparin at this time per MD  Pharmacy Consult for Heparin Indication: chest pain/ACS  No Known Allergies  Patient Measurements: Height: 5\' 2"  (157.5 cm) Weight: 264 lb 8.8 oz (120 kg) IBW/kg (Calculated) : 50.1 Heparin Dosing Weight: 79.8  Vital Signs: Temp: 99.2 F (37.3 C) (11/05 1123) Temp Source: Oral (11/05 1123) BP: 131/85 (11/05 1123) Pulse Rate: 99 (11/05 1123)  Labs: Recent Labs    07/20/19 1551 07/23/19 1128  HGB 9.9* 10.9*  HCT 31.8* 35.2*  PLT PLATELET CLUMPS NOTED ON SMEAR, UNABLE TO ESTIMATE 171  CREATININE 1.06* 1.59*  TROPONINIHS 17 1,313*    Estimated Creatinine Clearance: 50.4 mL/min (A) (by C-G formula based on SCr of 1.59 mg/dL (H)).   Medical History: Past Medical History:  Diagnosis Date  . Allergic rhinitis   . Chickenpox   . Depression   . Diabetes ([place])   . Epilepsy ([place])   . Headache   . HTN (hypertension)   . Hyperlipidemia   . Seizures ([place])   . Stroke ([place])   . Urinary incontinence     Medications:  No anticoagulation prior to admission  Assessment: Patient is a 53 y/o F with hx of CVA who presented to [facility] ED 11/5 c/o fall and dizziness. Troponin elevated. EKG changes. No acute intracranial abnormality noted on CT head. Pharmacy has been consulted for heparin drip for ACS. Baseline H&H significant for anemia but consistent with baseline. Platelets at lower end of normal. Baseline coags pending.  Goal of Therapy:  Heparin level 0.3-0.7 units/ml Monitor platelets by anticoagulation protocol: Yes   Plan:  -Heparin bolus 4000 units IV x 1 followed by continuous infusion at 900 units/hr. -HL 6 hours after initiation of infusion -Daily CBC per protocol  [place] Resident 07/23/2019,1:13 PM

## 2019-07-23 NOTE — ED Notes (Signed)
Blood that was drawn by IV team has clotted, unable to send to lab

## 2019-07-23 NOTE — ED Notes (Signed)
Dr. Kerman Passey at bedside with u/s to attempt IV access

## 2019-07-23 NOTE — Progress Notes (Signed)
PHARMACIST - PHYSICIAN COMMUNICATION  CONCERNING:  Enoxaparin (Lovenox) for DVT Prophylaxis   RECOMMENDATION: Patient was prescribed enoxaprin 40mg  q24 hours for VTE prophylaxis.   Filed Weights   07/23/19 1123  Weight: 264 lb 8.8 oz (120 kg)   Body mass index is 48.39 kg/m.  Estimated Creatinine Clearance: 50.4 mL/min (A) (by C-G formula based on SCr of 1.59 mg/dL (H)).  Based on [place] patient is candidate for enoxaparin 40mg  every 12 hour dosing due to BMI being >40.  DESCRIPTION: Pharmacy has adjusted enoxaparin dose per [facility] policy.  Patient is now receiving enoxaparin 40mg  every 12 hours.   Pernell Dupre, PharmD, BCPS Clinical Pharmacist 07/23/2019 6:06 PM

## 2019-07-23 NOTE — ED Notes (Signed)
Dr. Cinda Quest informed of critical trop

## 2019-07-23 NOTE — ED Notes (Signed)
EMS pt to lobby, lost her balance and fell , EMS reports pt swells of urine

## 2019-07-23 NOTE — ED Notes (Signed)
Attempted to call report to [phone]. Secretary states that they have not approved it yet and the nurse has not had time to review pt's chart. Will retry in a few minutes.[name]

## 2019-07-23 NOTE — ED Notes (Signed)
Pt otf for imaging 

## 2019-07-23 NOTE — ED Provider Notes (Signed)
[facility] Emergency Department Provider Note   ____________________________________________   First MD Initiated Contact with Patient 07/23/19 1207     (approximate)  I have reviewed the triage vital signs and the nursing notes.   HISTORY  Chief Complaint Fall    HPI [name] is a 53 y.o. female he has had a prior stroke but has been doing well at home up until recently.  She began getting weak and not wanting to walk and came in the emergency room on the second.  She was diagnosed with UTI and given Bactrim.  Since then she has become weaker and more groggy.  She is awake and alert but very sleepy and easily confused which is not her usual mental status.  She is complaining now of weakness in both legs and falling a lot more than she had been.  She also says she feels weak all over.  There is no focal weakness and she does not feel like she has had another stroke.        Past Medical History:  Diagnosis Date   Allergic rhinitis    Chickenpox    Depression    Diabetes (HCC)    Epilepsy (HCC)    Headache    HTN (hypertension)    Hyperlipidemia    Seizures (HCC)    Stroke [facility])    Urinary incontinence     Patient Active Problem List   Diagnosis Date Noted   Cerebral infarction (HCC) 08/14/2016   Acute CVA (cerebrovascular accident) (HCC) 08/14/2016   Delirium due to another medical condition 08/13/2016   Urinary tract infection 08/13/2016   Right foot infection 05/11/2016   Foot abscess, right 05/11/2016   Anxiety and depression 12/02/2015   CVA (cerebral infarction) 11/27/2015   Epilepsy (HCC) 06/17/2015   Complicated migraine 06/14/2015   Headache 05/19/2015   Weakness of left side of body 05/19/2015   History of stroke 05/19/2015   Severe obesity (BMI >= 40) (HCC) 03/19/2014   HTN (hypertension)    Diabetes (HCC)    Hyperlipidemia    [name] (transient ischemic attack) 03/17/2014    Past Surgical  History:  Procedure Laterality Date   CESAREAN SECTION     INCISION AND DRAINAGE Right 05/13/2016   Procedure: INCISION AND DRAINAGE;  Surgeon: Gwyneth Revels, DPM;  Location: [facility] ORS;  Service: Podiatry;  Laterality: Right;    Prior to Admission medications   Medication Sig Start Date End Date Taking? Authorizing Provider  aspirin 325 MG EC tablet Take 1 tablet (325 mg total) by mouth daily. 08/16/16   Ghimire, Werner Lean, MD  atorvastatin (LIPITOR) 40 MG tablet Take 1 tablet (40 mg total) by mouth daily at 6 PM. 08/16/16   Ghimire, Werner Lean, MD  divalproex (DEPAKOTE ER) 500 MG 24 hr tablet Take 1 tablet (500 mg total) by mouth daily. 08/16/16   GhimireWerner Lean, MD  etodolac (LODINE) 400 MG tablet Take 400 mg by mouth daily as needed.    [provider]  glipiZIDE (GLUCOTROL XL) 5 MG 24 hr tablet Take 1 tablet (5 mg total) by mouth daily. 08/16/16   Ghimire, Werner Lean, MD  insulin aspart (NOVOLOG FLEXPEN) 100 UNIT/ML FlexPen Inject 10 Units into the skin 3 (three) times daily with meals. 08/16/16   Ghimire, Werner Lean, MD  Insulin Detemir (LEVEMIR FLEXPEN) 100 UNIT/ML Pen Inject 40 Units into the skin daily at 10 pm. 08/16/16   Ghimire, Werner Lean, MD  lisinopril-hydrochlorothiazide (PRINZIDE,ZESTORETIC) 20-12.5 MG  tablet Take 1 tablet by mouth daily. 08/16/16   Ghimire, Henreitta Leber, MD  metFORMIN (GLUCOPHAGE) 500 MG tablet Take 1 tablet (500 mg total) by mouth 2 (two) times daily with a meal. 08/16/16   Ghimire, Henreitta Leber, MD  metoprolol tartrate (LOPRESSOR) 25 MG tablet Take 25 mg by mouth 2 (two) times a day.    [provider]  sulfamethoxazole-trimethoprim (BACTRIM DS) 800-160 MG tablet Take 1 tablet by mouth 2 (two) times daily. 07/20/19   Johnn Hai, PA-C    Allergies Patient has no known allergies.  Family History  Problem Relation Age of Onset   Hypertension Mother    Diabetes Mellitus II Mother    Hypertension Father    Pancreatic cancer Father      Social History Social History   Tobacco Use   Smoking status: Never Smoker   Smokeless tobacco: Never Used  Substance Use Topics   Alcohol use: No    Alcohol/week: 0.0 standard drinks   Drug use: No    Review of Systems  Constitutional: No fever/chills Eyes: No visual changes. ENT: No sore throat. Cardiovascular: Denies chest pain. Respiratory: Denies shortness of breath. Gastrointestinal: No abdominal pain.  No nausea, no vomiting.  No diarrhea.  No constipation. Genitourinary: Negative for dysuria. Musculoskeletal: Negative for back pain. Skin: Negative for rash. Neurological: Negative for headaches, focal weakness    ____________________________________________   PHYSICAL EXAM:  VITAL SIGNS: ED Triage Vitals [07/23/19 1123]  Enc Vitals Group     BP 131/85     Pulse Rate 99     Resp 14     Temp 99.2 F (37.3 C)     Temp Source Oral     SpO2 92 %     Weight 264 lb 8.8 oz (120 kg)     Height 5\' 2"  (1.575 m)     Head Circumference      Peak Flow      Pain Score 0     Pain Loc      Pain Edu?      Excl. in [place]?     Constitutional: Sleepy but arousable and oriented. Well appearing and in no acute distress. Eyes: Conjunctivae are normal. PERRL. EOMI. Head: Atraumatic. Nose: No congestion/rhinnorhea. Mouth/Throat: Mucous membranes are moist.  Oropharynx non-erythematous.  Poor dentition Neck: No stridor.  Cardiovascular: Normal rate, regular rhythm. Grossly normal heart sounds.  Good peripheral circulation. Respiratory: Normal respiratory effort.  No retractions. Lungs CTAB. Gastrointestinal: Soft and nontender. No distention. No abdominal bruits.  Musculoskeletal: No lower extremity tenderness nor edema.  No joint effusions. Neurologic:  Normal speech and language.  Cranial nerves II through XII are intact although visual fields were not checked rapid alternating movements in the hands are very slowed and patient gets her right and left in a confused  frequently.  Finger-to-nose is also very slow but there is no ataxia.  Motor strength is 5/5 throughout. Skin:  Skin is warm, dry and intact. No rash noted.   ____________________________________________   LABS (all labs ordered are listed, but only abnormal results are displayed)  Labs Reviewed  BASIC METABOLIC PANEL - Abnormal; Notable for the following components:      Result Value   BUN 29 (*)    Creatinine, Ser 1.59 (*)    GFR calc non Af Amer 37 (*)    GFR calc Af Amer 43 (*)    All other components within normal limits  CBC - Abnormal; Notable for the following  components:   RBC 5.52 (*)    Hemoglobin 10.9 (*)    HCT 35.2 (*)    MCV 63.8 (*)    MCH 19.7 (*)    RDW 19.4 (*)    All other components within normal limits  URINALYSIS, COMPLETE (UACMP) WITH MICROSCOPIC - Abnormal; Notable for the following components:   Color, Urine YELLOW (*)    APPearance CLOUDY (*)    Nitrite POSITIVE (*)    Leukocytes,Ua MODERATE (*)    Bacteria, UA MANY (*)    All other components within normal limits  HEPATIC FUNCTION PANEL - Abnormal; Notable for the following components:   Alkaline Phosphatase 37 (*)    All other components within normal limits  TROPONIN I (HIGH SENSITIVITY) - Abnormal; Notable for the following components:   Troponin I (High Sensitivity) 1,313 (*)    All other components within normal limits  SARS CORONAVIRUS 2 (TAT 6-24 HRS)  GLUCOSE, CAPILLARY  BRAIN NATRIURETIC PEPTIDE  CK  VALPROIC ACID LEVEL  CBG MONITORING, ED  POC URINE PREG, ED  POCT PREGNANCY, URINE   ____________________________________________  EKG  EKG read interpreted by me shows normal sinus rhythm rate of 96 left axis no R wave progression whatsoever flipped T's in 1 and L this is all quite new from EKG done 30 July.  Except for the axis change.  Patient had much better R wave progression and did not have flipped T's in 1 and L  previously. ____________________________________________  RADIOLOGY  ED MD interpretation: Chest x-ray read by radiology reviewed by me shows cardiomegaly with some pulmonary venous congestion.  I agree with radiologist reading.  Official radiology report(s): Dg Chest Portable 1 View  Result Date: 07/23/2019 CLINICAL DATA:  Weakness. EXAM: PORTABLE CHEST 1 VIEW COMPARISON:  07/20/2019. FINDINGS: Cardiomegaly with pulmonary venous congestion. No focal infiltrate. Interval resolution of basilar atelectasis with improved aeration of both lungs from prior exam. No pleural effusion or pneumothorax. Degenerative change thoracic spine. IMPRESSION: Cardiomegaly with pulmonary venous congestion. No focal infiltrate. Interval resolution of basilar atelectasis with improved aeration from prior exam. Electronically Signed   By: Maisie Fushomas  Register   On: 07/23/2019 12:39    ____________________________________________   PROCEDURES  Procedure(s) performed (including Critical Care):  Procedures   ____________________________________________   INITIAL IMPRESSION / ASSESSMENT AND PLAN / ED COURSE Patient with new EKG changes CHF and markedly elevated troponin.  She is now weak and falling she appears to have had a heart attack in the last few days.  She has no chest pain currently.  She is a diabetic though.  EKG does not look like a STEMI at this point.                   ____________________________________________   FINAL CLINICAL IMPRESSION(S) / ED DIAGNOSES  Final diagnoses:  Fall, initial encounter  AKI (acute kidney injury) (HCC)  Elevated troponin     ED Discharge Orders    None       Note:  This document was prepared using Dragon voice recognition software and may include unintentional dictation errors.    Arnaldo NatalMalinda, Elira Colasanti F, MD 07/23/19 1309

## 2019-07-23 NOTE — ED Notes (Signed)
.. ED TO INPATIENT HANDOFF REPORT  ED Nurse Name and Phone #: Pattricia Boss [phone]  Q Name/Age/Gender [name] 53 y.o. female Room/Bed: ED35A/ED35A  Code Status   Code Status: Full Code  Home/SNF/Other Home Patient oriented to: self, place, time and situation Is this baseline? Yes   Triage Complete: Triage complete  Chief Complaint ems lobby  fall  Triage Note Pt called from [facility] to treatment room, no response  Pt to ED via EMS from home c/o fall today while walking to bathroom, states she became dizzy.  Denies LOC, states landed on hands when she fell, denies pain at this time.   Allergies No Known Allergies  Level of Care/Admitting Diagnosis ED Disposition    ED Disposition Condition Comment   Admit  Hospital Area: [facility] [[id number]]  Level of Care: Telemetry [5]  Covid Evaluation: Asymptomatic Screening Protocol (No Symptoms)  Diagnosis: Acute metabolic encephalopathy [[id number]]  Admitting Physician: Rolly Salter [[id number]]  Attending Physician: Rolly Salter [[id number]]  Estimated length of stay: past midnight tomorrow  Certification:: I certify this patient will need inpatient services for at least 2 midnights  PT Class (Do Not Modify): Inpatient [101]  PT Acc Code (Do Not Modify): Private [1]       B Medical/Surgery History Past Medical History:  Diagnosis Date  . Allergic rhinitis   . Chickenpox   . Depression   . Diabetes (HCC)   . Epilepsy (HCC)   . Headache   . HTN (hypertension)   . Hyperlipidemia   . Seizures (HCC)   . Stroke (HCC)   . Urinary incontinence    Past Surgical History:  Procedure Laterality Date  . CESAREAN SECTION    . INCISION AND DRAINAGE Right 05/13/2016   Procedure: INCISION AND DRAINAGE;  Surgeon: Gwyneth Revels, DPM;  Location: [facility] ORS;  Service: Podiatry;  Laterality: Right;     A IV Location/Drains/Wounds Patient Lines/Drains/Airways Status   Active Line/Drains/Airways    Name:   Placement date:    Placement time:   Site:   Days:   Peripheral IV 07/23/19 Right Forearm   07/23/19    1508    Forearm   less than 1   Peripheral IV 07/23/19 Left Antecubital   07/23/19    2223    Antecubital   less than 1   Incision (Closed) 05/13/16 Foot Right   05/13/16    0838     1166   Wound / Incision (Open or Dehisced) 05/11/16 Diabetic ulcer Foot Right Diabetic ulcer to outer portion of right foot below 5th toe, discoloration noted, no drainage   05/11/16    1800    Foot   1168          Intake/Output Last 24 hours No intake or output data in the 24 hours ending 07/23/19 2254  Labs/Imaging Results for orders placed or performed during the hospital encounter of 07/23/19 (from the past 48 hour(s))  Basic metabolic panel     Status: Abnormal   Collection Time: 07/23/19 11:28 AM  Result Value Ref Range   Sodium 141 135 - 145 mmol/L   Potassium 3.7 3.5 - 5.1 mmol/L   Chloride 104 98 - 111 mmol/L   CO2 26 22 - 32 mmol/L   Glucose, Bld 98 70 - 99 mg/dL   BUN 29 (H) 6 - 20 mg/dL   Creatinine, Ser 1.88 (H) 0.44 - 1.00 mg/dL   Calcium 8.9 8.9 - 41.6 mg/dL   GFR calc  non Af Amer 37 (L) >60 mL/min   GFR calc Af Amer 43 (L) >60 mL/min   Anion gap 11 5 - 15    Comment: Performed at [facility], [address] [place], [place] [address]  CBC     Status: Abnormal   Collection Time: 07/23/19 11:28 AM  Result Value Ref Range   WBC 7.7 4.0 - 10.5 K/uL   RBC 5.52 (H) 3.87 - 5.11 MIL/uL   Hemoglobin 10.9 (L) 12.0 - 15.0 g/dL   HCT 01.035.2 (L) 27.236.0 - 53.646.0 %   MCV 63.8 (L) 80.0 - 100.0 fL   MCH 19.7 (L) 26.0 - 34.0 pg   MCHC 31.0 30.0 - 36.0 g/dL   RDW 64.419.4 (H) 03.411.5 - 74.215.5 %   Platelets 171 150 - 400 K/uL   nRBC 0.0 0.0 - 0.2 %    Comment: Performed at [facility], [address] [place], [place] [address]  Hepatic function panel     Status: Abnormal   Collection Time: 07/23/19 11:28 AM  Result Value Ref Range   Total Protein 7.4 6.5 - 8.1 g/dL   Albumin 3.7 3.5 - 5.0 g/dL    AST 26 15 - 41 U/L   ALT 13 0 - 44 U/L   Alkaline Phosphatase 37 (L) 38 - 126 U/L   Total Bilirubin 0.7 0.3 - 1.2 mg/dL   Bilirubin, Direct <8.7<0.1 0.0 - 0.2 mg/dL   Indirect Bilirubin NOT CALCULATED 0.3 - 0.9 mg/dL    Comment: Performed at [facility], [address] [place], [place] [address]  Troponin I (High Sensitivity)     Status: Abnormal   Collection Time: 07/23/19 11:28 AM  Result Value Ref Range   Troponin I (High Sensitivity) 1,313 (HH) <18 ng/L    Comment: CRITICAL RESULT CALLED TO, READ BACK BY AND VERIFIED WITH JESSICA FULCHER AT 1253 ON 07/23/2019 JJB (NOTE) Elevated high sensitivity troponin I (hsTnI) values and significant  changes across serial measurements may suggest ACS but many other  chronic and acute conditions are known to elevate hsTnI results.  Refer to the Links section for chest pain algorithms and additional  guidance. Performed at [facility], [address] [place], [place] [address]   Brain natriuretic peptide     Status: None   Collection Time: 07/23/19 11:28 AM  Result Value Ref Range   B Natriuretic Peptide 34.0 0.0 - 100.0 pg/mL    Comment: Performed at [facility], [address] [place], [place] [address]  Glucose, capillary     Status: None   Collection Time: 07/23/19 11:31 AM  Result Value Ref Range   Glucose-Capillary 83 70 - 99 mg/dL  Urinalysis, Complete w Microscopic     Status: Abnormal   Collection Time: 07/23/19 12:08 PM  Result Value Ref Range   Color, Urine YELLOW (A) YELLOW   APPearance CLOUDY (A) CLEAR   Specific Gravity, Urine 1.018 1.005 - 1.030   pH 6.0 5.0 - 8.0   Glucose, UA NEGATIVE NEGATIVE mg/dL   Hgb urine dipstick NEGATIVE NEGATIVE   Bilirubin Urine NEGATIVE NEGATIVE   Ketones, ur NEGATIVE NEGATIVE mg/dL   Protein, ur NEGATIVE NEGATIVE mg/dL   Nitrite POSITIVE (A) NEGATIVE   Leukocytes,Ua MODERATE (A) NEGATIVE   RBC / HPF 6-10 0 - 5 RBC/hpf   WBC, UA 6-10 0 - 5 WBC/hpf    Bacteria, UA MANY (A) NONE SEEN   Squamous Epithelial / LPF 11-20 0 - 5  Mucus PRESENT     Comment: Performed at [facility], [address] [place], [place] [address]  Pregnancy, urine POC     Status: None   Collection Time: 07/23/19 12:13 PM  Result Value Ref Range   Preg Test, Ur NEGATIVE NEGATIVE    Comment:        THE SENSITIVITY OF THIS METHODOLOGY IS >24 mIU/mL   SARS CORONAVIRUS 2 (TAT 6-24 HRS) Nasopharyngeal Nasopharyngeal Swab     Status: None   Collection Time: 07/23/19  1:20 PM   Specimen: Nasopharyngeal Swab  Result Value Ref Range   SARS Coronavirus 2 NEGATIVE NEGATIVE    Comment: (NOTE) SARS-CoV-2 target nucleic acids are NOT DETECTED. The SARS-CoV-2 RNA is generally detectable in upper and lower respiratory specimens during the acute phase of infection. Negative results do not preclude SARS-CoV-2 infection, do not rule out co-infections with other pathogens, and should not be used as the sole basis for treatment or other patient management decisions. Negative results must be combined with clinical observations, patient history, and epidemiological information. The expected result is Negative. Fact Sheet for Patients: HairSlick.no Fact Sheet for Healthcare Providers: quierodirigir.com This test is not yet approved or cleared by the [place] FDA and  has been authorized for detection and/or diagnosis of SARS-CoV-2 by FDA under an Emergency Use Authorization (EUA). This EUA will remain  in effect (meaning this test can be used) for the duration of the COVID-19 declaration under Section 56 4(b)(1) of the Act, 21 U.S.C. section 360bbb-3(b)(1), unless the authorization is terminated or revoked sooner. Performed at [facility], [address], [place], [place] [address]   CK     Status: Abnormal   Collection Time: 07/23/19  3:13 PM  Result Value Ref Range   Total CK 242 (H) 38 - 234  U/L    Comment: Performed at [facility], [address] [place], [place] [address]  Valproic acid level     Status: None   Collection Time: 07/23/19  3:13 PM  Result Value Ref Range   Valproic Acid Lvl 64 50.0 - 100.0 ug/mL    Comment: Performed at [facility], [address] [place], [place] [address]  APTT     Status: None   Collection Time: 07/23/19  3:13 PM  Result Value Ref Range   aPTT 31 24 - 36 seconds    Comment: Performed at [facility], [address] [place], [place] [address]  Protime-INR     Status: None   Collection Time: 07/23/19  3:13 PM  Result Value Ref Range   Prothrombin Time 13.5 11.4 - 15.2 seconds   INR 1.0 0.8 - 1.2    Comment: (NOTE) INR goal varies based on device and disease states. Performed at [facility], [address] [place], [place] [address]    Ct Head Wo Contrast  Result Date: 07/23/2019 CLINICAL DATA:  UTI, leg swelling EXAM: CT HEAD WITHOUT CONTRAST TECHNIQUE: Contiguous axial images were obtained from the base of the skull through the vertex without intravenous contrast. COMPARISON:  04/16/2019 FINDINGS: Brain: No evidence of acute infarction, hemorrhage, extra-axial collection, ventriculomegaly, or mass effect. 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 Generalized cerebral atrophy. Periventricular white matter low attenuation likely secondary to microangiopathy. Vascular: Cerebrovascular atherosclerotic calcifications are noted. Skull: Negative for fracture or focal lesion. Sinuses/Orbits: Visualized portions of the orbits are unremarkable. Visualized portions of the paranasal sinuses and mastoid air cells are unremarkable. Other: None. IMPRESSION:  1. No acute intracranial pathology. 2. 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 Electronically Signed   By: Kathreen Devoid   On: 07/23/2019 13:17   Mr Brain Wo Contrast  Result Date: 07/23/2019 CLINICAL DATA:  53 year old  female with unexplained altered mental status. Chronic ischemic disease. EXAM: MRI HEAD WITHOUT CONTRAST TECHNIQUE: Multiplanar, multiecho pulse sequences of the brain and surrounding structures were obtained without intravenous contrast. COMPARISON:  Brain MRI 04/16/2019. Head CT earlier today. FINDINGS: Brain: 9 millimeter lacunar infarct with restricted diffusion in the left lentiform/external capsule (series 5, image 24). Mild T2 and FLAIR hyperintensity with no associated hemorrhage or mass effect. A superimposed chronic microhemorrhage of the posterior left lentiform was present on the prior exam. No other restricted diffusion. Chronic right hemisphere encephalomalacia involving the posterior right MCA and some of the right PCA territory. Chronic lacunar infarcts in the pons. Stable gray and white matter signal findings above aside. From the acute finding above. No midline shift, mass effect, evidence of mass lesion, ventriculomegaly, extra-axial collection or acute intracranial hemorrhage. Cervicomedullary junction and pituitary are within normal limits. Vascular: Major intracranial vascular flow voids appear stable. Skull and upper cervical spine: Stable bone marrow signal. Upper cervical spine today obscured by motion. Sinuses/Orbits: Negative orbits. Paranasal sinuses are clear. Other: Mastoids remain clear. Negative scalp and face soft tissues. IMPRESSION: 1. Acute lacunar infarct in the left lentiform/external capsule. No acute hemorrhage or associated mass effect. 2. Underlying advanced chronic ischemic disease which is otherwise stable since July. Electronically Signed   By: Genevie Ann M.D.   On: 07/23/2019 15:05   Dg Chest Portable 1 View  Result Date: 07/23/2019 CLINICAL DATA:  Weakness. EXAM: PORTABLE CHEST 1 VIEW COMPARISON:  07/20/2019. FINDINGS: Cardiomegaly with pulmonary venous congestion. No focal infiltrate. Interval resolution of basilar atelectasis with improved aeration of both lungs from  prior exam. No pleural effusion or pneumothorax. Degenerative change thoracic spine. IMPRESSION: Cardiomegaly with pulmonary venous congestion. No focal infiltrate. Interval resolution of basilar atelectasis with improved aeration from prior exam. Electronically Signed   By: Marcello Moores  Register   On: 07/23/2019 12:39    Pending Labs Unresulted Labs (From admission, onward)    Start     Ordered   07/24/19 0500  CBC  Tomorrow morning,   STAT     07/23/19 1323   07/24/19 0500  HIV Antibody (routine testing w rflx)  (HIV Antibody (Routine testing w reflex) panel)  Tomorrow morning,   STAT     07/23/19 1759   07/24/19 0500  Hemoglobin A1c  Tomorrow morning,   STAT     07/23/19 1759   07/24/19 0500  Lipid panel  Tomorrow morning,   STAT    Comments: Fasting    07/23/19 1759   07/23/19 1510  Iron and TIBC  Once,   STAT     07/23/19 1509   07/23/19 1510  Ferritin  Once,   STAT     07/23/19 1509   07/23/19 1510  Vitamin B12  Once,   STAT     07/23/19 1509   07/23/19 1510  Vitamin B1  Once,   STAT     07/23/19 1509   07/23/19 1311  Urine culture  Add-on,   AD     07/23/19 1311          Vitals/Pain Today's Vitals   07/23/19 1123 07/23/19 1320 07/23/19 1445  BP: 131/85 (!) 146/86   Pulse: 99 93 87  Resp: 14 19   Temp: 99.2  F (37.3 C)    TempSrc: Oral    SpO2: 92% 98% 97%  Weight: 120 kg    Height: 5\' 2"  (1.575 m)    PainSc: 0-No pain      Isolation Precautions No active isolations  Medications Medications   stroke: mapping our early stages of recovery book (has no administration in time range)  acetaminophen (TYLENOL) tablet 650 mg (has no administration in time range)    Or  acetaminophen (TYLENOL) 160 MG/5ML solution 650 mg (has no administration in time range)    Or  acetaminophen (TYLENOL) suppository 650 mg (has no administration in time range)  senna-docusate (Senokot-S) tablet 1 tablet (has no administration in time range)  enoxaparin (LOVENOX) injection 40 mg (has  no administration in time range)  aspirin suppository 300 mg (has no administration in time range)    Or  aspirin EC tablet 325 mg (has no administration in time range)  insulin aspart (novoLOG) injection 0-15 Units (has no administration in time range)  insulin aspart (novoLOG) injection 0-5 Units (has no administration in time range)  atorvastatin (LIPITOR) tablet 40 mg (has no administration in time range)  divalproex (DEPAKOTE) DR tablet 250 mg (has no administration in time range)  gabapentin (NEURONTIN) capsule 300 mg (has no administration in time range)  darifenacin (ENABLEX) 24 hr tablet 7.5 mg (has no administration in time range)  cefTRIAXone (ROCEPHIN) 1 g in sodium chloride 0.9 % 100 mL IVPB (has no administration in time range)  divalproex (DEPAKOTE) DR tablet 500 mg (has no administration in time range)  gabapentin (NEURONTIN) capsule 600 mg (has no administration in time range)  aspirin chewable tablet 324 mg (324 mg Oral Given 07/23/19 1321)  cefTRIAXone (ROCEPHIN) 1 g in sodium chloride 0.9 % 100 mL IVPB (0 g Intravenous Stopped 07/23/19 1657)    Mobility non-ambulatory Moderate fall risk      R Recommendations: See Admitting Provider Note  Report given to:   Additional Notes:

## 2019-07-23 NOTE — ED Notes (Signed)
IV team at bedside 

## 2019-07-23 NOTE — ED Notes (Signed)
Pt otf for imaging, daughter at bedside

## 2019-07-23 NOTE — ED Notes (Addendum)
Came into room where IV was not in arm anymore, pt unsure how this happened, catheter intact. Admitting provider and EDP informed. Will place another IV team consult.

## 2019-07-23 NOTE — ED Notes (Signed)
IV team attempted access but was unsuccessful, Dr. Cinda Quest and admitting provider informed

## 2019-07-23 NOTE — ED Notes (Addendum)
Pt seen Monday and dx with UTI and leg swelling, legs swelling has subsided but now legs feel very weak. Reports frequent falls since being discharged on Monday. Pt did not start taking abx for UTI until Tuesday. Pt reports she has not been dizzy but when she stands her legs hurt.

## 2019-07-23 NOTE — ED Triage Notes (Signed)
Pt called from [facility] to treatment room, no response 

## 2019-07-24 ENCOUNTER — Inpatient Hospital Stay (HOSPITAL_COMMUNITY)
Admit: 2019-07-24 | Discharge: 2019-07-24 | Disposition: A | Discharge status: Home / Self Care | Payer: BC Managed Care – PPO | Attending: Internal Medicine | Admitting: Internal Medicine

## 2019-07-24 ENCOUNTER — Inpatient Hospital Stay: Payer: BC Managed Care – PPO

## 2019-07-24 ENCOUNTER — Other Ambulatory Visit: Payer: Self-pay

## 2019-07-24 DIAGNOSIS — I6381 Other cerebral infarction due to occlusion or stenosis of small artery: Secondary | ICD-10-CM | POA: Diagnosis present

## 2019-07-24 DIAGNOSIS — R778 Other specified abnormalities of plasma proteins: Secondary | ICD-10-CM

## 2019-07-24 DIAGNOSIS — I10 Essential (primary) hypertension: Secondary | ICD-10-CM

## 2019-07-24 DIAGNOSIS — N179 Acute kidney failure, unspecified: Secondary | ICD-10-CM | POA: Diagnosis present

## 2019-07-24 DIAGNOSIS — I6389 Other cerebral infarction: Secondary | ICD-10-CM

## 2019-07-24 DIAGNOSIS — N3000 Acute cystitis without hematuria: Secondary | ICD-10-CM

## 2019-07-24 DIAGNOSIS — R7989 Other specified abnormal findings of blood chemistry: Secondary | ICD-10-CM | POA: Diagnosis present

## 2019-07-24 DIAGNOSIS — E119 Type 2 diabetes mellitus without complications: Secondary | ICD-10-CM

## 2019-07-24 DIAGNOSIS — R569 Unspecified convulsions: Secondary | ICD-10-CM

## 2019-07-24 HISTORY — DX: Other cerebral infarction due to occlusion or stenosis of small artery: I63.81

## 2019-07-24 LAB — ECHOCARDIOGRAM LIMITED
Height: 62 in
Weight: 4156.8 oz

## 2019-07-24 LAB — LIPID PANEL
Cholesterol: 169 mg/dL (ref 0–200)
HDL: 50 mg/dL (ref >40)
LDL Cholesterol: 99 mg/dL (ref 0–99)
Total CHOL/HDL Ratio: 3.4 RATIO
Triglycerides: 100 mg/dL (ref <150)
VLDL: 20 mg/dL (ref 0–40)

## 2019-07-24 LAB — IRON AND TIBC
Iron: 43 ug/dL (ref 28–170)
Saturation Ratios: 11 % (ref 10.4–31.8)
TIBC: 386 ug/dL (ref 250–450)
UIBC: 343 ug/dL

## 2019-07-24 LAB — VITAMIN B12: Vitamin B-12: 237 pg/mL (ref 180–914)

## 2019-07-24 LAB — GLUCOSE, CAPILLARY
Glucose-Capillary: 182 mg/dL — ABNORMAL HIGH (ref 70–99)
Glucose-Capillary: 205 mg/dL — ABNORMAL HIGH (ref 70–99)
Glucose-Capillary: 211 mg/dL — ABNORMAL HIGH (ref 70–99)
Glucose-Capillary: 222 mg/dL — ABNORMAL HIGH (ref 70–99)
Glucose-Capillary: 230 mg/dL — ABNORMAL HIGH (ref 70–99)

## 2019-07-24 LAB — TROPONIN I (HIGH SENSITIVITY)
Troponin I (High Sensitivity): 1000 ng/L (ref <18)
Troponin I (High Sensitivity): 521 ng/L (ref <18)

## 2019-07-24 LAB — CBC
HCT: 37.1 % (ref 36.0–46.0)
Hemoglobin: 11.2 g/dL — ABNORMAL LOW (ref 12.0–15.0)
MCH: 19.5 pg — ABNORMAL LOW (ref 26.0–34.0)
MCHC: 30.2 g/dL (ref 30.0–36.0)
MCV: 64.5 fL — ABNORMAL LOW (ref 80.0–100.0)
Platelets: 161 10*3/uL (ref 150–400)
RBC: 5.75 MIL/uL — ABNORMAL HIGH (ref 3.87–5.11)
RDW: 19.9 % — ABNORMAL HIGH (ref 11.5–15.5)
WBC: 7.3 10*3/uL (ref 4.0–10.5)
nRBC: 0 % (ref 0.0–0.2)

## 2019-07-24 LAB — FERRITIN: Ferritin: 53 ng/mL (ref 11–307)

## 2019-07-24 LAB — HEMOGLOBIN A1C
Hgb A1c MFr Bld: 9.1 % — ABNORMAL HIGH (ref 4.8–5.6)
Mean Plasma Glucose: 214.47 mg/dL

## 2019-07-24 LAB — HIV ANTIBODY (ROUTINE TESTING W REFLEX): HIV Screen 4th Generation wRfx: NONREACTIVE

## 2019-07-24 MED ORDER — INSULIN DETEMIR 100 UNIT/ML ~~LOC~~ SOLN
20.0000 [IU] | Freq: Every day | SUBCUTANEOUS | Status: DC
  Administered 2019-07-24 – 2019-07-25 (×2): 20 [IU] via SUBCUTANEOUS
  Filled 2019-07-24 (×3): qty 0.2

## 2019-07-24 MED ORDER — SODIUM CHLORIDE 0.9 % IV SOLN
INTRAVENOUS | Status: DC
  Administered 2019-07-24 – 2019-07-25 (×2): via INTRAVENOUS

## 2019-07-24 MED ORDER — LORAZEPAM 2 MG/ML IJ SOLN: 0.5000 mg | INTRAMUSCULAR | Status: DC | PRN

## 2019-07-24 NOTE — Progress Notes (Signed)
PT Cancellation Note  Patient Details Name: [name] MRN: [id number] DOB: [birth year]   Cancelled Treatment:    Reason Eval/Treat Not Completed: Other (comment)(PT entered room, pt sleeping soundly. Pt did move her head and groaned once to her name, but did not wake to participate. PT will re-attempt as able.)   Lieutenant Diego PT, DPT [phone] AM,07/24/19 [phone]

## 2019-07-24 NOTE — Plan of Care (Signed)
  Problem: Clinical Measurements: Goal: Respiratory complications will improve Outcome: Progressing Goal: Cardiovascular complication will be avoided Outcome: Progressing   Problem: Education: Goal: Knowledge of disease or condition will improve Outcome: Progressing Goal: Knowledge of secondary prevention will improve Outcome: Progressing Goal: Knowledge of patient specific risk factors addressed and post discharge goals established will improve Outcome: Progressing   Problem: Nutrition: Goal: Risk of aspiration will decrease Outcome: Progressing

## 2019-07-24 NOTE — Consult Note (Signed)
11/6: Patient resting on bed  US carotid 11/06:Bilateral carotid bifurcation plaque resulting in less than 50% diameter ICA stenosis.Antegrade bilateral vertebral arterial flow. ECHO pending  Neuro exam remains essentially unchanged and stable Alert, awake, oriented x2, speech is nle, follows commands PERLA, EOMI, vff seems full, left facial, face sensation nle to touch, uvula/tongue midline She is 5/5 on RUEX. 4/5 on RLEX and LUEX. She is 3/5 on LLEX No sensory deficit appreciated to touch Left dysmetria to FN DTRs and gait not checked at time of evaluation  RECS:  - -Neuro protectives measures while admityted including normothermia, normoglycemia, correct electrolytes/metabolic abnlities, treat any infection - Stroke w/up echo/US. Will f/up - PT/OT. Appreciate  recs and help - swallow eval - ASA/statin if no CI and not on it. - dvt prophylaxis - Will follow up with you     INITIAL CONSULT 11/5 Reason for Consult: Acute lacunar infarct in the left lentiform/external capsule Referring Physician: Hospitalist    HPI: [name] is an 53 y.o. 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 She states she had a bout of dizziness. She c/o weakness on both legs, no sz like activity, no visual disturbance, no HA,  Patient started on rocephin for UTI. Labs per EMR: Head ct: No acute intracranial pathology.Old right temporoparietal infarct with encephalomalacia. MRI: Acute lacunar infarct in the left lentiform/external capsule. No acute hemorrhage or associated mass effect.Underlying advanced chronic ischemic disease which is otherwise stable since July.  Past Medical History:  Diagnosis Date  . Allergic rhinitis   . Chickenpox   . Depression   . Diabetes (HCC)   .  Epilepsy (HCC)   . Headache   . HTN (hypertension)   . Hyperlipidemia   . Seizures (HCC)   . Stroke (HCC)   . Urinary incontinence     Past Surgical History:  Procedure Laterality Date  . CESAREAN SECTION    . INCISION AND DRAINAGE Right 05/13/2016   Procedure: INCISION AND DRAINAGE;  Surgeon: Gwyneth RevelsJustin Fowler, DPM;  Location: [facility] ORS;  Service: Podiatry;  Laterality: Right;    Family History  Problem Relation Age of Onset  . Hypertension Mother   . Diabetes Mellitus II Mother   . Hypertension Father   . Pancreatic cancer Father     Social History:  reports that she has never smoked. She has never used smokeless tobacco. She reports that she does not drink alcohol or use drugs.  No Known Allergies  Medications: I have reviewed the patient's current medications.  ROS: *as per hpi Physical Examination: Blood pressure 136/81, pulse 85, temperature 97.9 F (36.6 C), resp. rate 18, height 5\' 2"  (1.575 m), weight 117.8 kg, SpO2 100 %.  Neurologic Examination Alert, awake, oriented x2, speech is nle, follows commands PERLA< EOMI, vff seems full, left facial, face sensation nle to touch, uvula/tongue midline She is 5/5 on RUEX. 4/5 on RLEX and LUEX. She is 3/5 on LLEX No sensory deficit appreciated to touch Left dysmetria to FN DTRs and gait not checked at time of evaluation  Results for orders placed or performed during the hospital encounter of 07/23/19 (from the past 48 hour(s))  Basic metabolic panel     Status: Abnormal   Collection Time: 07/23/19 11:28 AM  Result Value Ref Range  Sodium 141 135 - 145 mmol/L   Potassium 3.7 3.5 - 5.1 mmol/L   Chloride 104 98 - 111 mmol/L   CO2 26 22 - 32 mmol/L   Glucose, Bld 98 70 - 99 mg/dL   BUN 29 (H) 6 - 20 mg/dL   Creatinine, Ser 1.59 (H) 0.44 - 1.00 mg/dL   Calcium 8.9 8.9 - 10.3 mg/dL   GFR calc non Af Amer 37 (L) >60 mL/min   GFR calc Af Amer 43 (L) >60 mL/min   Anion gap 11 5 - 15    Comment: Performed at [facility], [address] [place], [place] [address]  CBC     Status: Abnormal   Collection Time: 07/23/19 11:28 AM  Result Value Ref Range   WBC 7.7 4.0 - 10.5 K/uL   RBC 5.52 (H) 3.87 - 5.11 MIL/uL   Hemoglobin 10.9 (L) 12.0 - 15.0 g/dL   HCT 35.2 (L) 36.0 - 46.0 %   MCV 63.8 (L) 80.0 - 100.0 fL   MCH 19.7 (L) 26.0 - 34.0 pg   MCHC 31.0 30.0 - 36.0 g/dL   RDW 19.4 (H) 11.5 - 15.5 %   Platelets 171 150 - 400 K/uL   nRBC 0.0 0.0 - 0.2 %    Comment: Performed at [facility], [address] [place], [place] [address]  Hepatic function panel     Status: Abnormal   Collection Time: 07/23/19 11:28 AM  Result Value Ref Range   Total Protein 7.4 6.5 - 8.1 g/dL   Albumin 3.7 3.5 - 5.0 g/dL   AST 26 15 - 41 U/L   ALT 13 0 - 44 U/L   Alkaline Phosphatase 37 (L) 38 - 126 U/L   Total Bilirubin 0.7 0.3 - 1.2 mg/dL   Bilirubin, Direct <0.1 0.0 - 0.2 mg/dL   Indirect Bilirubin NOT CALCULATED 0.3 - 0.9 mg/dL    Comment: Performed at [facility], [place], [place] [address]  Troponin I (High Sensitivity)     Status: Abnormal   Collection Time: 07/23/19 11:28 AM  Result Value Ref Range   Troponin I (High Sensitivity) 1,313 (HH) <18 ng/L    Comment: CRITICAL RESULT CALLED TO, READ BACK BY AND VERIFIED WITH JESSICA FULCHER AT 1253 ON 07/23/2019 JJB (NOTE) Elevated high sensitivity troponin I (hsTnI) values and significant  changes across serial measurements may suggest ACS but many other  chronic and acute conditions are known to elevate hsTnI results.  Refer to the Links section for chest pain algorithms and additional  guidance. Performed at [facility], [address] [place], [place] [address]   Brain natriuretic peptide     Status: None   Collection Time: 07/23/19 11:28 AM  Result Value Ref Range   B Natriuretic Peptide 34.0 0.0 - 100.0 pg/mL    Comment: Performed at [facility], [address] [place], [place] [address]   Glucose, capillary     Status: None   Collection Time: 07/23/19 11:31 AM  Result Value Ref Range   Glucose-Capillary 83 70 - 99 mg/dL  Urinalysis, Complete w Microscopic     Status: Abnormal   Collection Time: 07/23/19 12:08 PM  Result Value Ref Range   Color, Urine YELLOW (A) YELLOW   APPearance CLOUDY (A) CLEAR   Specific Gravity, Urine 1.018 1.005 - 1.030   pH 6.0 5.0 - 8.0   Glucose, UA NEGATIVE NEGATIVE mg/dL   Hgb urine dipstick NEGATIVE NEGATIVE  Bilirubin Urine NEGATIVE NEGATIVE   Ketones, ur NEGATIVE NEGATIVE mg/dL   Protein, ur NEGATIVE NEGATIVE mg/dL   Nitrite POSITIVE (A) NEGATIVE   Leukocytes,Ua MODERATE (A) NEGATIVE   RBC / HPF 6-10 0 - 5 RBC/hpf   WBC, UA 6-10 0 - 5 WBC/hpf   Bacteria, UA MANY (A) NONE SEEN   Squamous Epithelial / LPF 11-20 0 - 5   Mucus PRESENT     Comment: Performed at [facility], [address] [place], [place] [address]  Urine culture     Status: Abnormal (Preliminary result)   Collection Time: 07/23/19 12:08 PM   Specimen: Urine, Random  Result Value Ref Range   Specimen Description      URINE, RANDOM Performed at [facility], [address] [place], [place] [address]    Special Requests      NONE Performed at [facility], [address] [place], [place] [address]    Culture >=100,000 COLONIES/mL GRAM NEGATIVE RODS (A)    Report Status PENDING   Pregnancy, urine POC     Status: None   Collection Time: 07/23/19 12:13 PM  Result Value Ref Range   Preg Test, Ur NEGATIVE NEGATIVE    Comment:        THE SENSITIVITY OF THIS METHODOLOGY IS >24 mIU/mL   SARS CORONAVIRUS 2 (TAT 6-24 HRS) Nasopharyngeal Nasopharyngeal Swab     Status: None   Collection Time: 07/23/19  1:20 PM   Specimen: Nasopharyngeal Swab  Result Value Ref Range   SARS Coronavirus 2 NEGATIVE NEGATIVE    Comment: (NOTE) SARS-CoV-2 target nucleic acids are NOT DETECTED. The SARS-CoV-2 RNA is generally detectable in upper and  lower respiratory specimens during the acute phase of infection. Negative results do not preclude SARS-CoV-2 infection, do not rule out co-infections with other pathogens, and should not be used as the sole basis for treatment or other patient management decisions. Negative results must be combined with clinical observations, patient history, and epidemiological information. The expected result is Negative. Fact Sheet for Patients: HairSlick.no Fact Sheet for Healthcare Providers: quierodirigir.com This test is not yet approved or cleared by the [place] FDA and  has been authorized for detection and/or diagnosis of SARS-CoV-2 by FDA under an Emergency Use Authorization (EUA). This EUA will remain  in effect (meaning this test can be used) for the duration of the COVID-19 declaration under Section 56 4(b)(1) of the Act, 21 U.S.C. section 360bbb-3(b)(1), unless the authorization is terminated or revoked sooner. Performed at [facility], [address], [place], [place] [address]   CK     Status: Abnormal   Collection Time: 07/23/19  3:13 PM  Result Value Ref Range   Total CK 242 (H) 38 - 234 U/L    Comment: Performed at [facility], [address] [place], [place] [address]  Valproic acid level     Status: None   Collection Time: 07/23/19  3:13 PM  Result Value Ref Range   Valproic Acid Lvl 64 50.0 - 100.0 ug/mL    Comment: Performed at [facility], [address] [place], [place] [address]  APTT     Status: None   Collection Time: 07/23/19  3:13 PM  Result Value Ref Range   aPTT 31 24 - 36 seconds    Comment: Performed at [facility], [address], [place], [place] [address]  Protime-INR     Status: None   Collection Time: 07/23/19  3:13 PM  Result Value Ref Range   Prothrombin Time 13.5 11.4 - 15.2 seconds   INR 1.0 0.8 - 1.2    Comment: (NOTE) INR goal varies based on  device and disease states. Performed at [facility], [address] [place], [place] [address]   Glucose, capillary     Status: Abnormal   Collection Time: 07/24/19 12:17 AM  Result Value Ref Range   Glucose-Capillary 182 (H) 70 - 99 mg/dL  Troponin I (High Sensitivity)     Status: Abnormal   Collection Time: 07/24/19  2:41 AM  Result Value Ref Range   Troponin I (High Sensitivity) 1,000 (HH) <18 ng/L    Comment: CRITICAL VALUE NOTED. VALUE IS CONSISTENT WITH PREVIOUSLY REPORTED/CALLED VALUE SRC (NOTE) Elevated high sensitivity troponin I (hsTnI) values and significant  changes across serial measurements may suggest ACS but many other  chronic and acute conditions are known to elevate hsTnI results.  Refer to the "Links" section for chest pain algorithms and additional  guidance. Performed at [facility], [address] [place], [place] [address]   Iron and TIBC     Status: None   Collection Time: 07/24/19  2:41 AM  Result Value Ref Range   Iron 43 28 - 170 ug/dL   TIBC 098 119 - 147 ug/dL   Saturation Ratios 11 10.4 - 31.8 %   UIBC 343 ug/dL    Comment: Performed at [facility], [address] [place], [place] [address]  Ferritin     Status: None   Collection Time: 07/24/19  2:41 AM  Result Value Ref Range   Ferritin 53 11 - 307 ng/mL    Comment: Performed at [facility], [address] [place], [place] [address]  Vitamin B12     Status: None   Collection Time: 07/24/19  2:41 AM  Result Value Ref Range   Vitamin B-12 237 180 - 914 pg/mL    Comment: (NOTE) This assay is not validated for testing neonatal or myeloproliferative syndrome specimens for Vitamin B12 levels. Performed at [facility], [address], [place], [place] [address]   CBC     Status: Abnormal   Collection Time: 07/24/19  2:41 AM  Result Value Ref Range   WBC 7.3 4.0 - 10.5 K/uL   RBC 5.75 (H) 3.87 - 5.11 MIL/uL   Hemoglobin 11.2 (L) 12.0 -  15.0 g/dL   HCT 69.6 29.5 - 28.4 %   MCV 64.5 (L) 80.0 - 100.0 fL   MCH 19.5 (L) 26.0 - 34.0 pg   MCHC 30.2 30.0 - 36.0 g/dL   RDW 13.2 (H) 44.0 - 10.2 %   Platelets 161 150 - 400 K/uL   nRBC 0.0 0.0 - 0.2 %    Comment: Performed at [facility], [address] [place], [place] [address]  Hemoglobin A1c     Status: Abnormal   Collection Time: 07/24/19  2:41 AM  Result Value Ref Range   Hgb A1c MFr Bld 9.1 (H) 4.8 - 5.6 %    Comment: (NOTE) Pre diabetes:          5.7%-6.4% Diabetes:              >6.4% Glycemic control for   <7.0% adults with diabetes    Mean Plasma Glucose 214.47 mg/dL    Comment: Performed at [facility], [address], [place], [place] [address]  Lipid panel     Status:  None   Collection Time: 07/24/19  2:41 AM  Result Value Ref Range   Cholesterol 169 0 - 200 mg/dL   Triglycerides 885 <027 mg/dL   HDL 50 >74 mg/dL   Total CHOL/HDL Ratio 3.4 RATIO   VLDL 20 0 - 40 mg/dL   LDL Cholesterol 99 0 - 99 mg/dL    Comment:        Total Cholesterol/HDL:CHD Risk Coronary Heart Disease Risk Table                     Men   Women  1/2 Average Risk   3.4   3.3  Average Risk       5.0   4.4  2 X Average Risk   9.6   7.1  3 X Average Risk  23.4   11.0        Use the calculated Patient Ratio above and the CHD Risk Table to determine the patient's CHD Risk.        ATP III CLASSIFICATION (LDL):  <100     mg/dL   Optimal  128-786  mg/dL   Near or Above                    Optimal  130-159  mg/dL   Borderline  767-209  mg/dL   High  >470     mg/dL   Very High Performed at [facility], [address] [place], [place] [address]   Glucose, capillary     Status: Abnormal   Collection Time: 07/24/19  7:37 AM  Result Value Ref Range   Glucose-Capillary 222 (H) 70 - 99 mg/dL  Glucose, capillary     Status: Abnormal   Collection Time: 07/24/19 12:08 PM  Result Value Ref Range   Glucose-Capillary 205 (H) 70 - 99 mg/dL    Recent Results  (from the past 240 hour(s))  Urine culture     Status: Abnormal   Collection Time: 07/20/19  3:07 PM   Specimen: Urine, Random  Result Value Ref Range Status   Specimen Description   Final    URINE, RANDOM Performed at [facility], [address] [place], [place] [address]    Special Requests   Final    Normal Performed at [facility], [address] [place], [place] [address]    Culture >=100,000 COLONIES/mL PROTEUS MIRABILIS (A)  Final   Report Status 07/23/2019 FINAL  Final   Organism ID, Bacteria PROTEUS MIRABILIS (A)  Final      Susceptibility   Proteus mirabilis - MIC*    AMPICILLIN <=2 SENSITIVE Sensitive     CEFAZOLIN <=4 SENSITIVE Sensitive     CEFTRIAXONE <=1 SENSITIVE Sensitive     CIPROFLOXACIN <=0.25 SENSITIVE Sensitive     GENTAMICIN <=1 SENSITIVE Sensitive     IMIPENEM 1 SENSITIVE Sensitive     NITROFURANTOIN 128 RESISTANT Resistant     TRIMETH/SULFA <=20 SENSITIVE Sensitive     AMPICILLIN/SULBACTAM <=2 SENSITIVE Sensitive     PIP/TAZO <=4 SENSITIVE Sensitive     * >=100,000 COLONIES/mL PROTEUS MIRABILIS  Urine culture     Status: Abnormal (Preliminary result)   Collection Time: 07/23/19 12:08 PM   Specimen: Urine, Random  Result Value Ref Range Status   Specimen Description   Final    URINE, RANDOM Performed at [facility], [address] [place], [place] [address]    Special Requests   Final    NONE Performed  at [facility], [address] [place], [place] [address]    Culture >=100,000 COLONIES/mL GRAM NEGATIVE RODS (A)  Final   Report Status PENDING  Incomplete  SARS CORONAVIRUS 2 (TAT 6-24 HRS) Nasopharyngeal Nasopharyngeal Swab     Status: None   Collection Time: 07/23/19  1:20 PM   Specimen: Nasopharyngeal Swab  Result Value Ref Range Status   SARS Coronavirus 2 NEGATIVE NEGATIVE Final    Comment: (NOTE) SARS-CoV-2 target nucleic acids are NOT DETECTED. The SARS-CoV-2 RNA is generally detectable in  upper and lower respiratory specimens during the acute phase of infection. Negative results do not preclude SARS-CoV-2 infection, do not rule out co-infections with other pathogens, and should not be used as the sole basis for treatment or other patient management decisions. Negative results must be combined with clinical observations, patient history, and epidemiological information. The expected result is Negative. Fact Sheet for Patients: HairSlick.no Fact Sheet for Healthcare Providers: quierodirigir.com This test is not yet approved or cleared by the [place] FDA and  has been authorized for detection and/or diagnosis of SARS-CoV-2 by FDA under an Emergency Use Authorization (EUA). This EUA will remain  in effect (meaning this test can be used) for the duration of the COVID-19 declaration under Section 56 4(b)(1) of the Act, 21 U.S.C. section 360bbb-3(b)(1), unless the authorization is terminated or revoked sooner. Performed at [facility], [address], [place], [place] [address]     Ct Head Wo Contrast  Result Date: 07/23/2019 CLINICAL DATA:  UTI, leg swelling EXAM: CT HEAD WITHOUT CONTRAST TECHNIQUE: Contiguous axial images were obtained from the base of the skull through the vertex without intravenous contrast. COMPARISON:  04/16/2019 FINDINGS: Brain: No evidence of acute infarction, hemorrhage, extra-axial collection, ventriculomegaly, or mass effect. 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 Generalized cerebral atrophy. Periventricular white matter low attenuation likely secondary to microangiopathy. Vascular: Cerebrovascular atherosclerotic calcifications are noted. Skull: Negative for fracture or focal lesion. Sinuses/Orbits: Visualized portions of the orbits are unremarkable. Visualized portions of the paranasal sinuses and mastoid air cells are unremarkable. Other: None. IMPRESSION: 1. No  acute intracranial pathology. 2. 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 Electronically Signed   By: Elige Ko   On: 07/23/2019 13:17   Mr Brain Wo Contrast  Result Date: 07/23/2019 CLINICAL DATA:  53 year old female with unexplained altered mental status. Chronic ischemic disease. EXAM: MRI HEAD WITHOUT CONTRAST TECHNIQUE: Multiplanar, multiecho pulse sequences of the brain and surrounding structures were obtained without intravenous contrast. COMPARISON:  Brain MRI 04/16/2019. Head CT earlier today. FINDINGS: Brain: 9 millimeter lacunar infarct with restricted diffusion in the left lentiform/external capsule (series 5, image 24). Mild T2 and FLAIR hyperintensity with no associated hemorrhage or mass effect. A superimposed chronic microhemorrhage of the posterior left lentiform was present on the prior exam. No other restricted diffusion. Chronic right hemisphere encephalomalacia involving the posterior right MCA and some of the right PCA territory. Chronic lacunar infarcts in the pons. Stable gray and white matter signal findings above aside. From the acute finding above. No midline shift, mass effect, evidence of mass lesion, ventriculomegaly, extra-axial collection or acute intracranial hemorrhage. Cervicomedullary junction and pituitary are within normal limits. Vascular: Major intracranial vascular flow voids appear stable. Skull and upper cervical spine: Stable bone marrow signal. Upper cervical spine today obscured by motion. Sinuses/Orbits: Negative orbits. Paranasal sinuses are clear. Other: Mastoids remain clear. Negative scalp and face soft tissues. IMPRESSION: 1. Acute lacunar infarct in the left lentiform/external capsule. No  acute hemorrhage or associated mass effect. 2. Underlying advanced chronic ischemic disease which is otherwise stable since July. Electronically Signed   By: Odessa Fleming M.D.   On: 07/23/2019 15:05   US Carotid Bilateral (at [facility] And [facility] Only)  Result Date:  07/24/2019 CLINICAL DATA:  Stroke.  Hypertension, hyperlipidemia, diabetes. EXAM: BILATERAL CAROTID DUPLEX ULTRASOUND TECHNIQUE: Wallace Cullens scale imaging, color Doppler and duplex ultrasound were performed of bilateral carotid and vertebral arteries in the neck. COMPARISON:  05/19/2015 FINDINGS: Criteria: Quantification of carotid stenosis is based on velocity parameters that correlate the residual internal carotid diameter with NASCET-based stenosis levels, using the diameter of the distal internal carotid lumen as the denominator for stenosis measurement. The following velocity measurements were obtained: RIGHT ICA: 65/26 cm/sec CCA: 87/19 cm/sec SYSTOLIC ICA/CCA RATIO:  0.7 ECA: 174 cm/sec LEFT ICA: 77/27 cm/sec CCA: 78/14 cm/sec SYSTOLIC ICA/CCA RATIO:  1.0 ECA: 104 cm/sec RIGHT CAROTID ARTERY: Intimal thickening in the common carotid artery. Partially calcified plaque in the bulb and proximal ICA. No high-grade stenosis. Normal waveforms and color Doppler signal. RIGHT VERTEBRAL ARTERY:  Normal flow direction and waveform. LEFT CAROTID ARTERY: Mild tortuosity. Calcified nonocclusive plaque in the common carotid artery. No high-grade stenosis. Normal waveforms and color Doppler signal. LEFT VERTEBRAL ARTERY:  Normal flow direction and waveform. IMPRESSION: 1. Bilateral carotid bifurcation plaque resulting in less than 50% diameter ICA stenosis. 2. Antegrade bilateral vertebral arterial flow. Electronically Signed   By: Corlis Leak M.D.   On: 07/24/2019 10:47   Dg Chest Portable 1 View  Result Date: 07/23/2019 CLINICAL DATA:  Weakness. EXAM: PORTABLE CHEST 1 VIEW COMPARISON:  07/20/2019. FINDINGS: Cardiomegaly with pulmonary venous congestion. No focal infiltrate. Interval resolution of basilar atelectasis with improved aeration of both lungs from prior exam. No pleural effusion or pneumothorax. Degenerative change thoracic spine. IMPRESSION: Cardiomegaly with pulmonary venous congestion. No focal infiltrate.  Interval resolution of basilar atelectasis with improved aeration from prior exam. 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 - dvt prophylaxis - Will follow up with you 07/24/2019, 1:36 PM

## 2019-07-24 NOTE — Progress Notes (Signed)
PROGRESS NOTE    [name]  OZH:[id number] DOB: 02/24/1966 DOA: 07/23/2019 PCP: [facility]    Brief Narrative:   [name] is a 53 y.o. female with Past medical history of depression, type II DM, seizures, HTN, CVA with residual left-sided weakness, HLD, incontinence of urine. Patient was brought in by family to the hospital because of a fall and confusion. Reportedly patient was seen recently for a UTI and was on Bactrim. Patient has been having generalized weakness as well as difficulty speaking for last 1 week and off and on noticing some confusion. Patient had a fall the other day as her legs were giving away. This morning she had another fall when she fell that she was not able to lift her leg but her husband thought she should be able to walk and made her walk. She denies having any head injury or neck injury.  Denies any passing out episode. Denies any chest pain chest tightness denies any shortness of breath denies any cough denies any fever.  No burning urination.  No diarrhea no constipation. Patient is compliant with all her medications. Her sugars has been running anywhere from 200-300 on the high side to 50s and 60s on the low side for last couple of days. There is also reported poor p.o. intake. No other change in medications reported.  ED Course: Presents with a fall and generalized weakness. Work-up shows that she has possible UTI as well as elevated troponin. There is also concern for CHF. Patient was referred for admission for UTI and CHF.  Work-up that I performed identified acute lacunar infarct as well.  At her baseline ambulates with assistance independent for most of her ADL;  manages her medication on her own.  Interim History: 1. trop elevation: trop 1313 --> 1000, consulted card, Dr. Mariah Milling 2. MRI showed acute lacunar infarct in the left lentiform/external capsule. No acute hemorrhage or associated mass effect. Underlying  advanced chronic ischemic disease which is otherwise stable since July.   Assessment & Plan:   Principal Problem:   Lacunar stroke, acute (HCC) Active Problems:   HTN (hypertension)   Diabetes mellitus without complication (HCC)   Hyperlipidemia   History of stroke   Urinary tract infection   Acute metabolic encephalopathy   Seizure (HCC)   Elevated troponin   AKI (acute kidney injury) (HCC)   Lacunar stroke, acute (HCC): MRI showed acute lacunar infarct in the left lentiform/external capsule. Neuro was consulted, Dr. Tomi Bamberger evalated pt. He recommended stroke work-up.  - Highly appreciated neurologist's consultation,will follow up recommendations - MRA  - Check carotid dopplers  - ASA  - Lipitor 40 mg daily - fasting lipid panel and HbA1c  - 2D transthoracic echocardiography  - Carotid Doppler - swallowing screen. If fails, will get SLP - PT/OT  Diabetes mellitus without complication: Last A1c 11.3 on 04/16/19, poorly controled. Patient is taking Levemir 40 units daily and NovoLog at home -Levemir 20 units daily -SSI  Possible UTI: pt has positive urinalysis and urinary frequency. -ceftriaxone. -F/u Urine culture  Seizure -Seizure precaution -When necessary Ativan for seizure -Continue Home medications:  Depakote  Acute kidney injury: Baseline serum creatinine 1.0-1.06.  Current serum creatinine 1.59. Likely secondary to Bactrim as well as ongoing use of lisinopril or just HCTZ combination in a patient who is having poor p.o. intake for at least last 1 week. -holding off on this medication and hopefully it will improve her renal function. -IVF: 67 cc/h  NS  Elevated troponin: no CP. Possible demand ischemia. Troponin levels were 1301 -->1000 -trend trop -consult card, dr. Rockey Situ - on ASA and lipitor  Recurrent falls: Likely multifactorial. CT scan as well as MRI were negative for any acute hemorrhage or acute fracture. - PT OT consulted.  Essential  hypertension. - Holding lisinopril HCTZ. - Allowing permissive hypertension in the setting of acute stroke.  Urinary incontinence. -Continue Vesicare for now.  DVT prophylaxis: Levenox Code Status: fall Family Communication: none, not at bedsdie Disposition Plan: possible d/c tomorrow after completing stroke work u Barriers for discharge:    Consultants:  -neurology  Procedures:    Antimicrobials:  Anti-infectives (From admission, onward)   Start     Dose/Rate Route Frequency Ordered Stop   07/24/19 1000  cefTRIAXone (ROCEPHIN) 1 g in sodium chloride 0.9 % 100 mL IVPB     1 g 200 mL/hr over 30 Minutes Intravenous Every 24 hours 07/23/19 1934     07/23/19 1315  cefTRIAXone (ROCEPHIN) 1 g in sodium chloride 0.9 % 100 mL IVPB     1 g 200 mL/hr over 30 Minutes Intravenous  Once 07/23/19 1311 07/23/19 1657     Subjective:  Patient states that she has old bilateral leg weakness. has urinary frequency, no chest pain, nausea, vomiting, abdominal pain or diarrhea.  Objective: Vitals:   07/24/19 0304 07/24/19 0446 07/24/19 0735 07/24/19 1541  BP: (!) 158/81 136/81 136/81 127/81  Pulse: 87 84 85 90  Resp: 20 20 18 18   Temp: 98.6 F (37 C) 98.4 F (36.9 C) 97.9 F (36.6 C) 97.9 F (36.6 C)  TempSrc: Oral Oral    SpO2: 97% 97% 100% 100%  Weight:      Height:        Intake/Output Summary (Last 24 hours) at 07/24/2019 1906 Last data filed at 07/24/2019 1019 Gross per 24 hour  Intake 0 ml  Output --  Net 0 ml   Filed Weights   07/23/19 1123 07/24/19 0038  Weight: 120 kg 117.8 kg    Examination: Physical Exam:  General: Not in acute distress HEENT: PERRL, EOMI, no scleral icterus, No JVD or bruit Cardiac: S1/S2, RRR, No murmurs, gallops or rubs Pulm:  Clear to auscultation bilaterally. No rales, wheezing, rhonchi or rubs. Abd: Soft, nondistended, nontender, no rebound pain, no organomegaly, BS present Ext: No edema. 2+DP/PT pulse bilaterally Musculoskeletal:  No joint deformities, erythema, or stiffness, ROM full Skin: No rashes.  Neuro: Alert and oriented X3, cranial nerves II-XII grossly intact, muscle strength 5/5 in all extremeties, sensation to light touch intact. Psych: Patient is not psychotic, no suicidal or hemocidal ideation.    Data Reviewed: I have personally reviewed following labs and imaging studies  CBC: Recent Labs  Lab 07/20/19 1551 07/23/19 1128 07/24/19 0241  WBC 10.0 7.7 7.3  NEUTROABS 6.7  --   --   HGB 9.9* 10.9* 11.2*  HCT 31.8* 35.2* 37.1  MCV 63.2* 63.8* 64.5*  PLT PLATELET CLUMPS NOTED ON SMEAR, UNABLE TO ESTIMATE 171 335   Basic Metabolic Panel: Recent Labs  Lab 07/20/19 1551 07/23/19 1128  NA 141 141  K 3.6 3.7  CL 103 104  CO2 24 26  GLUCOSE 74 98  BUN 23* 29*  CREATININE 1.06* 1.59*  CALCIUM 8.8* 8.9   GFR: Estimated Creatinine Clearance: 49.9 mL/min (A) (by C-G formula based on SCr of 1.59 mg/dL (H)). Liver Function Tests: Recent Labs  Lab 07/20/19 1551 07/23/19 1128  AST 21 26  ALT 11 13  ALKPHOS 34* 37*  BILITOT 1.0 0.7  PROT 7.0 7.4  ALBUMIN 3.6 3.7   No results for input(s): LIPASE, AMYLASE in the last 168 hours. No results for input(s): AMMONIA in the last 168 hours. Coagulation Profile: Recent Labs  Lab 07/23/19 1513  INR 1.0   Cardiac Enzymes: Recent Labs  Lab 07/23/19 1513  CKTOTAL 242*   BNP (last 3 results) No results for input(s): PROBNP in the last 8760 hours. HbA1C: Recent Labs    07/24/19 0241  HGBA1C 9.1*   CBG: Recent Labs  Lab 07/23/19 1131 07/24/19 0017 07/24/19 0737 07/24/19 1208 07/24/19 1631  GLUCAP 83 182* 222* 205* 230*   Lipid Profile: Recent Labs    07/24/19 0241  CHOL 169  HDL 50  LDLCALC 99  TRIG 100  CHOLHDL 3.4   Thyroid Function Tests: No results for input(s): TSH, T4TOTAL, FREET4, T3FREE, THYROIDAB in the last 72 hours. Anemia Panel: Recent Labs    07/24/19 0241  VITAMINB12 237  FERRITIN 53  TIBC 386  IRON 43    Sepsis Labs: No results for input(s): PROCALCITON, LATICACIDVEN in the last 168 hours.  Recent Results (from the past 240 hour(s))  Urine culture     Status: Abnormal   Collection Time: 07/20/19  3:07 PM   Specimen: Urine, Random  Result Value Ref Range Status   Specimen Description   Final    URINE, RANDOM Performed at [facility], [address] [place], [place] [address]    Special Requests   Final    Normal Performed at [facility], [address] [place], [place] [address]    Culture >=100,000 COLONIES/mL PROTEUS MIRABILIS (A)  Final   Report Status 07/23/2019 FINAL  Final   Organism ID, Bacteria PROTEUS MIRABILIS (A)  Final      Susceptibility   Proteus mirabilis - MIC*    AMPICILLIN <=2 SENSITIVE Sensitive     CEFAZOLIN <=4 SENSITIVE Sensitive     CEFTRIAXONE <=1 SENSITIVE Sensitive     CIPROFLOXACIN <=0.25 SENSITIVE Sensitive     GENTAMICIN <=1 SENSITIVE Sensitive     IMIPENEM 1 SENSITIVE Sensitive     NITROFURANTOIN 128 RESISTANT Resistant     TRIMETH/SULFA <=20 SENSITIVE Sensitive     AMPICILLIN/SULBACTAM <=2 SENSITIVE Sensitive     PIP/TAZO <=4 SENSITIVE Sensitive     * >=100,000 COLONIES/mL PROTEUS MIRABILIS  Urine culture     Status: Abnormal (Preliminary result)   Collection Time: 07/23/19 12:08 PM   Specimen: Urine, Random  Result Value Ref Range Status   Specimen Description   Final    URINE, RANDOM Performed at [facility], [address] [place], [place] [address]    Special Requests   Final    NONE Performed at [facility], [address] [place], [place] [address]    Culture (A)  Final    >=100,000 COLONIES/mL GRAM NEGATIVE RODS CULTURE REINCUBATED FOR BETTER GROWTH Performed at [facility], [address], [place], [place] [address]    Report Status PENDING  Incomplete  SARS CORONAVIRUS 2 (TAT 6-24 HRS) Nasopharyngeal Nasopharyngeal Swab     Status: None   Collection Time: 07/23/19  1:20  PM   Specimen: Nasopharyngeal Swab  Result Value Ref Range Status   SARS Coronavirus 2 NEGATIVE NEGATIVE Final    Comment: (NOTE) SARS-CoV-2 target nucleic acids are NOT DETECTED. The SARS-CoV-2 RNA is generally detectable in upper and lower respiratory specimens during the  acute phase of infection. Negative results do not preclude SARS-CoV-2 infection, do not rule out co-infections with other pathogens, and should not be used as the sole basis for treatment or other patient management decisions. Negative results must be combined with clinical observations, patient history, and epidemiological information. The expected result is Negative. Fact Sheet for Patients: HairSlick.nohttps://www.fda.gov/media/138098/download Fact Sheet for Healthcare Providers: quierodirigir.comhttps://www.fda.gov/media/138095/download This test is not yet approved or cleared by the [place] FDA and  has been authorized for detection and/or diagnosis of SARS-CoV-2 by FDA under an Emergency Use Authorization (EUA). This EUA will remain  in effect (meaning this test can be used) for the duration of the COVID-19 declaration under Section 56 4(b)(1) of the Act, 21 U.S.C. section 360bbb-3(b)(1), unless the authorization is terminated or revoked sooner. Performed at [facility], [address] [place], [place] [address]         Radiology Studies: Ct Head Wo Contrast  Result Date: 07/23/2019 CLINICAL DATA:  UTI, leg swelling EXAM: CT HEAD WITHOUT CONTRAST TECHNIQUE: Contiguous axial images were obtained from the base of the skull through the vertex without intravenous contrast. COMPARISON:  04/16/2019 FINDINGS: Brain: No evidence of acute infarction, hemorrhage, extra-axial collection, ventriculomegaly, or mass effect. 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 Generalized cerebral atrophy. Periventricular white matter low attenuation likely secondary to microangiopathy. Vascular: Cerebrovascular atherosclerotic  calcifications are noted. Skull: Negative for fracture or focal lesion. Sinuses/Orbits: Visualized portions of the orbits are unremarkable. Visualized portions of the paranasal sinuses and mastoid air cells are unremarkable. Other: None. IMPRESSION: 1. No acute intracranial pathology. 2. 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 Electronically Signed   By: Elige KoHetal  Patel   On: 07/23/2019 13:17   Mr Angio Head Wo Contrast  Result Date: 07/24/2019 CLINICAL DATA:  53 year old female with altered mental status and chronic ischemic disease. Acute lacunar infarct of the left lentiform/external capsule on brain MRI yesterday. EXAM: MRA HEAD WITHOUT CONTRAST TECHNIQUE: Angiographic images of the Circle of Willis were obtained using MRA technique without intravenous contrast. COMPARISON:  Brain MRI 07/23/2019.  Intracranial MRA 08/14/2016. FINDINGS: Study is mildly degraded by motion artifact despite repeated imaging attempts. Antegrade flow in the posterior circulation is stable from the 2017 MRA with dominant left and diminutive right distal vertebral arteries. The right vertebral appears to functionally terminates in PICA as before. The left vertebral supplies the basilar without stenosis. Patent basilar artery. Mild basilar irregularity without stenosis. Patent SCA and PCA origins. Bilateral PCA branches are stable since 2017 when allowing for motion. Antegrade flow in both ICA siphons also appears stable since 2017, with evidence of right greater than left siphon calcified plaque. Multifocal mild to moderate right ICA siphon stenosis appears increased, and maximal in the proximal supraclinoid segment. No left siphon stenosis. Patent carotid termini. Patent MCA and ACA origins. Mild chronic bilateral A1 stenosis. Visible ACA branches are otherwise within normal limits. Mild chronic left MCA M1 irregularity. The left MCA trifurcation appears stable. Left MCA branch detail is degraded by motion. Chronic  mild-to-moderate right M1 irregularity and stenosis does not appear progressed. Right MCA bifurcation appears stable. Right MCA branch detail degraded by motion. IMPRESSION: 1. Mildly degraded by motion despite repeated imaging attempts. 2. Chronic intracranial atherosclerosis with progression in the Right ICA siphon since the 2017 MRA. Up to moderate multifocal right siphon stenosis now. 3. Stable up to moderate Right M1 and mild bilateral A1 segment stenoses. 4. Stable and negative posterior circulation, non dominant right vertebral artery which functionally  terminates in PICA. Electronically Signed   By: Odessa Fleming M.D.   On: 07/24/2019 13:52   Mr Brain Wo Contrast  Result Date: 07/23/2019 CLINICAL DATA:  53 year old female with unexplained altered mental status. Chronic ischemic disease. EXAM: MRI HEAD WITHOUT CONTRAST TECHNIQUE: Multiplanar, multiecho pulse sequences of the brain and surrounding structures were obtained without intravenous contrast. COMPARISON:  Brain MRI 04/16/2019. Head CT earlier today. FINDINGS: Brain: 9 millimeter lacunar infarct with restricted diffusion in the left lentiform/external capsule (series 5, image 24). Mild T2 and FLAIR hyperintensity with no associated hemorrhage or mass effect. A superimposed chronic microhemorrhage of the posterior left lentiform was present on the prior exam. No other restricted diffusion. Chronic right hemisphere encephalomalacia involving the posterior right MCA and some of the right PCA territory. Chronic lacunar infarcts in the pons. Stable gray and white matter signal findings above aside. From the acute finding above. No midline shift, mass effect, evidence of mass lesion, ventriculomegaly, extra-axial collection or acute intracranial hemorrhage. Cervicomedullary junction and pituitary are within normal limits. Vascular: Major intracranial vascular flow voids appear stable. Skull and upper cervical spine: Stable bone marrow signal. Upper cervical  spine today obscured by motion. Sinuses/Orbits: Negative orbits. Paranasal sinuses are clear. Other: Mastoids remain clear. Negative scalp and face soft tissues. IMPRESSION: 1. Acute lacunar infarct in the left lentiform/external capsule. No acute hemorrhage or associated mass effect. 2. Underlying advanced chronic ischemic disease which is otherwise stable since July. Electronically Signed   By: Odessa Fleming M.D.   On: 07/23/2019 15:05   US Carotid Bilateral (at [facility] And [facility] Only)  Result Date: 07/24/2019 CLINICAL DATA:  Stroke.  Hypertension, hyperlipidemia, diabetes. EXAM: BILATERAL CAROTID DUPLEX ULTRASOUND TECHNIQUE: Wallace Cullens scale imaging, color Doppler and duplex ultrasound were performed of bilateral carotid and vertebral arteries in the neck. COMPARISON:  05/19/2015 FINDINGS: Criteria: Quantification of carotid stenosis is based on velocity parameters that correlate the residual internal carotid diameter with NASCET-based stenosis levels, using the diameter of the distal internal carotid lumen as the denominator for stenosis measurement. The following velocity measurements were obtained: RIGHT ICA: 65/26 cm/sec CCA: 87/19 cm/sec SYSTOLIC ICA/CCA RATIO:  0.7 ECA: 174 cm/sec LEFT ICA: 77/27 cm/sec CCA: 78/14 cm/sec SYSTOLIC ICA/CCA RATIO:  1.0 ECA: 104 cm/sec RIGHT CAROTID ARTERY: Intimal thickening in the common carotid artery. Partially calcified plaque in the bulb and proximal ICA. No high-grade stenosis. Normal waveforms and color Doppler signal. RIGHT VERTEBRAL ARTERY:  Normal flow direction and waveform. LEFT CAROTID ARTERY: Mild tortuosity. Calcified nonocclusive plaque in the common carotid artery. No high-grade stenosis. Normal waveforms and color Doppler signal. LEFT VERTEBRAL ARTERY:  Normal flow direction and waveform. IMPRESSION: 1. Bilateral carotid bifurcation plaque resulting in less than 50% diameter ICA stenosis. 2. Antegrade bilateral vertebral arterial flow. Electronically Signed   By: Corlis Leak M.D.   On: 07/24/2019 10:47   Dg Chest Portable 1 View  Result Date: 07/23/2019 CLINICAL DATA:  Weakness. EXAM: PORTABLE CHEST 1 VIEW COMPARISON:  07/20/2019. FINDINGS: Cardiomegaly with pulmonary venous congestion. No focal infiltrate. Interval resolution of basilar atelectasis with improved aeration of both lungs from prior exam. No pleural effusion or pneumothorax. Degenerative change thoracic spine. IMPRESSION: Cardiomegaly with pulmonary venous congestion. No focal infiltrate. Interval resolution of basilar atelectasis with improved aeration from prior exam. Electronically Signed   By: Maisie Fus  Register   On: 07/23/2019 12:39        Scheduled Meds:   stroke: mapping our early stages of recovery book   Does  not apply Once   aspirin  300 mg Rectal Daily   Or   aspirin EC  325 mg Oral Daily   atorvastatin  40 mg Oral q1800   darifenacin  7.5 mg Oral Daily   divalproex  250 mg Oral Daily   divalproex  500 mg Oral QHS   enoxaparin (LOVENOX) injection  40 mg Subcutaneous Q12H   gabapentin  300 mg Oral Daily   gabapentin  600 mg Oral QHS   insulin aspart  0-15 Units Subcutaneous TID WC   insulin aspart  0-5 Units Subcutaneous QHS   insulin detemir  20 Units Subcutaneous Daily   Continuous Infusions:  sodium chloride     cefTRIAXone (ROCEPHIN)  IV 1 g (07/24/19 1316)     LOS: 1 day    Time spent: 30    Lorretta Harp, DO [facility] PAGER is on AMION  If 7PM-7AM, please contact night-coverage [email or web address] Password [email or web address] 07/24/2019, 7:06 PM

## 2019-07-24 NOTE — Progress Notes (Addendum)
OT Cancellation Note  Patient Details Name: [name] MRN: [id number] DOB: 05-Jun-1966   Cancelled Treatment:    Reason Eval/Treat Not Completed: Patient at procedure or test/ unavailable. Consult received, chart reviewed. Pt out of room for testing. Also noted troponin critically elevated. Will re-attempt OT evaluation at later date/time as pt is available and medically appropriate.   Jeni Salles, MPH, MS, OTR/L ascom [phone] 07/24/19, 10:20 AM

## 2019-07-24 NOTE — Evaluation (Signed)
Physical Therapy Evaluation Patient Details Name: [name] MRN: [id number] DOB: [birth year] Today's Date: 07/24/2019   History of Present Illness   53 y.o. female with Past medical history of depression, type II DM, seizures, HTN, CVA with residual left-sided weakness, HLD, incontinence of urine.  Pt with weakness and multiple falls in the last week, imaging reveals a new CVA.  Clinical Impression  Pt sleepy but initially able to participate despite minimal verbal engagement.  She was, however, very slow and labored t/o the exam and subsequent gait training and did close eyes and nearly fall off to sleep multiple times t/o the session.  Her vitals were fine (O2 in the mid 90s, HR stable 80-100 bpm) even during activity but she did not able to fully engage and daughter/PT needed to give consistent encouragement to engage her fully.  She was able to ambulate ~35 ft with very slow, labored gait (reliant on the walker, no AD or SPC at baseline) and siting L knee pain needed to sit after modest effort.  Pt will have 24/7 assistance at home and should be able to return home safely however she will need HHPT to work back to baseline or at least a more functional and independent level.  This PT saw her ~4 months ago and she was markedly more mobile and safe with ambulation at that time.     Follow Up Recommendations Home health PT    Equipment Recommendations  None recommended by PT(would benefit from bed rails)    Recommendations for Other Services       Precautions / Restrictions Precautions Precautions: Fall Restrictions Weight Bearing Restrictions: No      Mobility  Bed Mobility Overal bed mobility: Needs Assistance Bed Mobility: Supine to Sit     Supine to sit: Min assist     General bed mobility comments: Pt showed good effort but need hand rails to roll and then light HHA to attain sitting  Transfers Overall transfer level: Needs assistance Equipment used: Rolling walker (2  wheeled) Transfers: Sit to/from Stand Sit to Stand: Min guard;Min assist         General transfer comment: Pt needed consistent cuing and encouragement to get to standing.  Heavy UE use and forward lean but needed only very little tactile cuing to keep hips forward getting up from low setting bed  Ambulation/Gait Ambulation/Gait assistance: Min guard Gait Distance (Feet): 35 Feet Assistive device: Rolling walker (2 wheeled)       General Gait Details: Pt with slow and labored ambulation.  Per pt/family she ambulates w/o AD most of the time and was able to be up for prolonged activities.  Today she struggled with each step, was highly reliant on the walker and started to c/o L knee pain after ~30 ft and needed to sit shortly thereafter.   Stairs            Wheelchair Mobility    Modified Rankin (Stroke Patients Only)       Balance Overall balance assessment: Needs assistance Sitting-balance support: No upper extremity supported Sitting balance-Leahy Scale: Good Sitting balance - Comments: Pt able to maintain static sitting balance relatively well   Standing balance support: Bilateral upper extremity supported Standing balance-Leahy Scale: Fair Standing balance comment: reliant on the walker                             Pertinent Vitals/Pain Pain Assessment: Faces Faces Pain Scale: Hurts  little more Pain Location: L knee pain during ambulation    Home Living Family/patient expects to be discharged to:: Private residence Living Arrangements: Children;Spouse/significant other Available Help at Discharge: Family;Available 24 hours/day Type of Home: House Home Access: Stairs to enter Entrance Stairs-Rails: None Entrance Stairs-Number of Steps: 3 Home Layout: One level Home Equipment: Cane - single point;Walker - 2 wheels;Grab bars - tub/shower(daughter indicated that they did have a BSC too)      Prior Function Level of Independence: Independent with  assistive device(s)               Hand Dominance   Dominant Hand: Right    Extremity/Trunk Assessment   Upper Extremity Assessment Upper Extremity Assessment: Generalized weakness(lacks shld elevation >100 b/l otherwise L grossly 3+/5, R 4-)    Lower Extremity Assessment Lower Extremity Assessment: Generalized weakness(R grossly 4/5, L grossly 4-/5)       Communication   Communication: Expressive difficulties  Cognition Arousal/Alertness: Lethargic Behavior During Therapy: WFL for tasks assessed/performed;Flat affect Overall Cognitive Status: Within Functional Limits for tasks assessed                                 General Comments: Pt sleepy, slow to answer, deferred to daughter with a lot of information gathering      General Comments      Exercises     Assessment/Plan    PT Assessment Patient needs continued PT services  PT Problem List Decreased strength;Decreased range of motion;Decreased activity tolerance;Decreased balance;Decreased mobility;Decreased coordination;Decreased cognition;Decreased knowledge of use of DME;Decreased safety awareness;Cardiopulmonary status limiting activity;Obesity;Pain       PT Treatment Interventions DME instruction;Gait training;Stair training;Functional mobility training;Therapeutic activities;Therapeutic exercise;Balance training;Neuromuscular re-education;Patient/family education    PT Goals (Current goals can be found in the Care Plan section)  Acute Rehab PT Goals Patient Stated Goal: go home PT Goal Formulation: With patient Time For Goal Achievement: 08/07/19 Potential to Achieve Goals: Fair    Frequency 7X/week   Barriers to discharge        Co-evaluation               AM-PAC PT "6 Clicks" Mobility  Outcome Measure Help needed turning from your back to your side while in a flat bed without using bedrails?: A Little Help needed moving from lying on your back to sitting on the side of a  flat bed without using bedrails?: A Little Help needed moving to and from a bed to a chair (including a wheelchair)?: A Little Help needed standing up from a chair using your arms (e.g., wheelchair or bedside chair)?: A Little Help needed to walk in hospital room?: A Little Help needed climbing 3-5 steps with a railing? : A Lot 6 Click Score: 17    End of Session Equipment Utilized During Treatment: Gait belt Activity Tolerance: Patient limited by fatigue;Patient limited by lethargy Patient left: with chair alarm set;with call bell/phone within reach;with family/visitor present Nurse Communication: Mobility status PT Visit Diagnosis: Muscle weakness (generalized) (M62.81);Difficulty in walking, not elsewhere classified (R26.2);Other symptoms and signs involving the nervous system (R29.898);Hemiplegia and hemiparesis Hemiplegia - Right/Left: Left Hemiplegia - dominant/non-dominant: Non-dominant Hemiplegia - caused by: Cerebral infarction    Time: 1610-9604 PT Time Calculation (min) (ACUTE ONLY): 48 min   Charges:   PT Evaluation $PT Eval Moderate Complexity: 1 Mod PT Treatments $Gait Training: 8-22 mins        Kreg Shropshire,  DPT 07/24/2019, 4:22 PM

## 2019-07-24 NOTE — Progress Notes (Signed)
SLP Cancellation Note  Patient Details Name: [name] MRN: [id number] DOB: 07-07-1966   Cancelled treatment:       Reason Eval/Treat Not Completed: Fatigue/lethargy limiting ability to participate(chart reviewed; consulted [facility] staff). Pt is too drowsy/lethargic and could not awaken sufficiently for adequate Cognitive-linguistic screening/assessment. ST services will f/u tomorrow.    Orinda Kenner, MS, CCC-SLP Watson,Katherine 07/24/2019, 4:50 PM

## 2019-07-24 NOTE — Progress Notes (Signed)
*  PRELIMINARY RESULTS* Echocardiogram 2D Echocardiogram limited has been performed.  [name] 07/24/2019, 9:34 AM

## 2019-07-24 NOTE — Progress Notes (Signed)
Inpatient Diabetes Program Recommendations  AACE/ADA: New Consensus Statement on Inpatient Glycemic Control (2015)  Target Ranges:  Prepandial:   less than 140 mg/dL      Peak postprandial:   less than 180 mg/dL (1-2 hours)      Critically ill patients:  140 - 180 mg/dL   Results for NICOLAS, SISLER (MRN [id number]) as of 07/24/2019 12:50  Ref. Range 07/23/2019 11:31 07/24/2019 00:17 07/24/2019 07:37 07/24/2019 12:08  Glucose-Capillary Latest Ref Range: 70 - 99 mg/dL 83 182 (H) 222 (H) 205 (H)   Results for TIASHA, HELVIE (MRN [id number]) as of 07/24/2019 12:50  Ref. Range 04/17/2019 04:57 07/24/2019 02:41  Hemoglobin A1C Latest Ref Range: 4.8 - 5.6 % 11.3 (H) 9.1 (H)    Admit Confusion with Fall at Home/ New CVA/ Possible UTI   History: DM, CVA   Home DM meds: Levemir 55 units QHS        Novolog 15 units TID        Metformin 500 mg BID   Current Orders: Novolog Moderate Correction Scale/ SSI (0-15 units) TID AC + HS     Current A1c 9.1% (down from 11.3% back in July). This DM Coordinator spoke w/ pt's husband back in July and he told me he helps pt with her meds at home.      MD- May consider starting a portion of patient's home dose of Levemir:  Recommend Levemir 20 units QHS (~1/3 total home dose)     --Will follow patient during hospitalization--  Wyn Quaker RN, MSN, CDE Diabetes Coordinator Inpatient Glycemic Control Team Team Pager: [phone] (8a-5p)

## 2019-07-24 NOTE — Progress Notes (Signed)
Patient refuses to wake up to participate in q2 NIH/neuro checks. NP Ouma made aware. No new orders.   [name] M

## 2019-07-25 DIAGNOSIS — E785 Hyperlipidemia, unspecified: Secondary | ICD-10-CM

## 2019-07-25 DIAGNOSIS — W19XXXA Unspecified fall, initial encounter: Secondary | ICD-10-CM

## 2019-07-25 LAB — BASIC METABOLIC PANEL
Anion gap: 8 (ref 5–15)
BUN: 18 mg/dL (ref 6–20)
CO2: 27 mmol/L (ref 22–32)
Calcium: 8.5 mg/dL — ABNORMAL LOW (ref 8.9–10.3)
Chloride: 107 mmol/L (ref 98–111)
Creatinine, Ser: 0.93 mg/dL (ref 0.44–1.00)
GFR calc Af Amer: >60 mL/min (ref >60)
GFR calc non Af Amer: >60 mL/min (ref >60)
Glucose, Bld: 174 mg/dL — ABNORMAL HIGH (ref 70–99)
Potassium: 4 mmol/L (ref 3.5–5.1)
Sodium: 142 mmol/L (ref 135–145)

## 2019-07-25 LAB — GLUCOSE, CAPILLARY
Glucose-Capillary: 149 mg/dL — ABNORMAL HIGH (ref 70–99)
Glucose-Capillary: 187 mg/dL — ABNORMAL HIGH (ref 70–99)

## 2019-07-25 LAB — CBC
HCT: 32.6 % — ABNORMAL LOW (ref 36.0–46.0)
Hemoglobin: 10.3 g/dL — ABNORMAL LOW (ref 12.0–15.0)
MCH: 19.5 pg — ABNORMAL LOW (ref 26.0–34.0)
MCHC: 31.6 g/dL (ref 30.0–36.0)
MCV: 61.7 fL — ABNORMAL LOW (ref 80.0–100.0)
Platelets: 163 10*3/uL (ref 150–400)
RBC: 5.28 MIL/uL — ABNORMAL HIGH (ref 3.87–5.11)
RDW: 19.2 % — ABNORMAL HIGH (ref 11.5–15.5)
WBC: 4.8 10*3/uL (ref 4.0–10.5)
nRBC: 0 % (ref 0.0–0.2)

## 2019-07-25 MED ORDER — ASPIRIN 325 MG PO TBEC: 325.0000 mg | DELAYED_RELEASE_TABLET | Freq: Every day | ORAL | 3 refills | Status: DC

## 2019-07-25 NOTE — Progress Notes (Signed)
Physical Therapy Treatment Patient Details Name: Elyzabeth Goatley MRN: [id number] DOB: 1966/01/29 Today's Date: 07/25/2019    History of Present Illness  53 y.o. female with Past medical history of depression, type II DM, seizures, HTN, CVA with residual left-sided weakness, HLD, incontinence of urine.  Pt with weakness and multiple falls in the last week, imaging reveals a new CVA.    PT Comments    Pt asleep on first attempt at 9:00.  Returned at 9:51 and pt more awake.  RN and tech both state she has been more lethargic today.  Participated in exercises as described below in attempts to awaken pt for mobility.  She often seems to drift back off to sleep or is less engaged and needs constant verbal and tactile cues to remain engaged.  To edge of bed with min/mod a x 1 and increased time and cues.  Once sitting she is generally steady.  Attempted to stand x 2 with no attempt to assist by pt.  On third attempt, she does put effort in and was able to stand with min assist +2.  She is able to march in place and transfer to recliner at bedside but further gait is limited at this time.  She remains up with OT in room for eval.  Pt requires +2 assist for mobility at this time.  Pt with limited progress and gait today limited by lethargy.  Anticipate improvement tomorrow but if she does not progress towards goals, SNF or possibly CIR may be appropriate for discharge planning.  Will continue to adjust recommendations as appropriate.   Follow Up Recommendations  Home health PT     Equipment Recommendations  Rolling walker with 5" wheels    Recommendations for Other Services       Precautions / Restrictions Precautions Precautions: Fall Restrictions Weight Bearing Restrictions: No    Mobility  Bed Mobility Overal bed mobility: Needs Assistance Bed Mobility: Supine to Sit     Supine to sit: Mod assist;Min assist        Transfers Overall transfer level: Needs assistance Equipment used:  Rolling walker (2 wheeled) Transfers: Sit to/from Stand Sit to Stand: Min assist;+2 physical assistance         General transfer comment: multiple attempts with varied level of attempt by pt.  Ambulation/Gait Ambulation/Gait assistance: Min guard;+2 safety/equipment Gait Distance (Feet): 3 Feet Assistive device: Rolling walker (2 wheeled) Gait Pattern/deviations: Step-to pattern     General Gait Details: transfers to chair only today due to lethargy.  Tech and RN both report pt more lethargic today.   Stairs             Wheelchair Mobility    Modified Rankin (Stroke Patients Only)       Balance Overall balance assessment: Needs assistance Sitting-balance support: Feet supported Sitting balance-Leahy Scale: Fair Sitting balance - Comments: inscreases supervision today due to lethargy.   Standing balance support: Bilateral upper extremity supported Standing balance-Leahy Scale: Fair Standing balance comment: reliant on the walker                            Cognition Arousal/Alertness: Lethargic Behavior During Therapy: WFL for tasks assessed/performed;Flat affect Overall Cognitive Status: Difficult to assess                                        Exercises  Other Exercises Other Exercises: BLE ARROM in attempts to awaken pt for mobility.  ankle pumps, heel slides, ab/add and slr with pt seeming to drift off at times to sleep/less engaged.    General Comments        Pertinent Vitals/Pain Pain Assessment: No/denies pain    Home Living                      Prior Function            PT Goals (current goals can now be found in the care plan section) Progress towards PT goals: Not progressing toward goals - comment    Frequency    7X/week      PT Plan Current plan remains appropriate;Other (comment)    Co-evaluation PT/OT/SLP Co-Evaluation/Treatment: Yes Reason for Co-Treatment: For patient/therapist  safety;To address functional/ADL transfers          AM-PAC PT "6 Clicks" Mobility   Outcome Measure  Help needed turning from your back to your side while in a flat bed without using bedrails?: A Little Help needed moving from lying on your back to sitting on the side of a flat bed without using bedrails?: A Little Help needed moving to and from a bed to a chair (including a wheelchair)?: A Lot Help needed standing up from a chair using your arms (e.g., wheelchair or bedside chair)?: A Lot Help needed to walk in hospital room?: A Lot Help needed climbing 3-5 steps with a railing? : A Lot 6 Click Score: 14    End of Session Equipment Utilized During Treatment: Gait belt Activity Tolerance: Patient limited by lethargy Patient left: with chair alarm set;with call bell/phone within reach;in chair Nurse Communication: Mobility status Hemiplegia - Right/Left: Left Hemiplegia - dominant/non-dominant: Non-dominant Hemiplegia - caused by: Cerebral infarction     Time: 3016-0109 PT Time Calculation (min) (ACUTE ONLY): 17 min  Charges:  $Therapeutic Activity: 8-22 mins                     Danielle Dess, PTA 07/25/19, 10:33 AM

## 2019-07-25 NOTE — Evaluation (Signed)
Occupational Therapy Evaluation Patient Details Name: Jing Howatt MRN: [id number] DOB: 1965/12/13 Today's Date: 07/25/2019    History of Present Illness  53 y.o. female with Past medical history of depression, type II DM, seizures, HTN, CVA with residual left-sided weakness, HLD, incontinence of urine.  Pt with weakness and multiple falls in the last week, imaging reveals a new CVA.   Clinical Impression   OT order received for evaluation.  Patient appears to have a flat affect, slow to respond to questions and commands during evaluation.  She indicates she required some assistance at home with lower body self care and home management tasks prior to admission.  She required 2 people for transfers for safety, min-mod of one to stand but another person to manage IV and for safety since patient was slow with responses and appears lethargic at times. Patient presents with muscle weakness, decreased ROM bilateral shoulders, coordination appears intact but slow to complete tasks. Patient has full fisting, oppositional movements and can hold and grasp items, use remote, etc.  Difficult to determine her baseline level of cognition from patient report and no family available to provide information.  She does require assist with lower body dressing and assist for toileting.  Patient would benefit from continued skilled OT services to maximize her safety and independence with necessary daily tasks.     Follow Up Recommendations  Home health OT;Other (comment)  MD in during session to indicate patient may return home this date with HHPT, if patient returns home then please order HHOT however, given her current level of assist required for self care and transfers she may be more appropriate for short term SNF or CIR to return to baseline level of care.  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    Equipment  Recommendations       Recommendations for Other Services       Precautions / Restrictions Precautions Precautions: Fall Restrictions Weight Bearing Restrictions: No      Mobility Bed Mobility Overal bed mobility: Needs Assistance Bed Mobility: Supine to Sit     Supine to sit: Mod assist;Min assist        Transfers Overall transfer level: Needs assistance Equipment used: Rolling walker (2 wheeled) Transfers: Sit to/from Stand Sit to Stand: Min assist;+2 physical assistance         General transfer comment: multiple attempts with varied level of attempt by pt.    Balance Overall balance assessment: Needs assistance Sitting-balance support: Feet supported Sitting balance-Leahy Scale: Fair Sitting balance - Comments: inscreases supervision today due to lethargy.   Standing balance support: Bilateral upper extremity supported Standing balance-Leahy Scale: Fair Standing balance comment: reliant on the walker                           ADL either performed or assessed with clinical judgement   ADL Overall ADL's : Needs assistance/impaired Eating/Feeding: Modified independent   Grooming: Set up;Sitting   Upper Body Bathing: Set up;Minimal assistance   Lower Body Bathing: Set up;Maximal assistance   Upper Body Dressing : Set up;Minimal assistance   Lower Body Dressing: Set up;Maximal assistance   Toilet Transfer: +2 for safety/equipment Toilet Transfer Details (indicate cue type and reason): patient slow with response to commands at times, 2 person for safety but patient could move from sit to stand with min  assist                 Vision Patient Visual Report: No change from baseline Vision Assessment?: No apparent visual deficits Additional Comments: Patient denies any changes in vision, she normally wears glasses for reading.     Perception     Praxis      Pertinent Vitals/Pain Pain Assessment: No/denies pain Faces Pain Scale: No  hurt     Hand Dominance Right   Extremity/Trunk Assessment Upper Extremity Assessment Upper Extremity Assessment: Generalized weakness           Communication Communication Communication: Expressive difficulties   Cognition Arousal/Alertness: Awake/alert Behavior During Therapy: WFL for tasks assessed/performed;Flat affect Overall Cognitive Status: Difficult to assess                                 General Comments: Patient was slow to answer questions and process information during evaluation this date, Unsure if this is different from her norm   General Comments       Exercises Other Exercises Other Exercises: BLE ARROM in attempts to awaken pt for mobility.  ankle pumps, heel slides, ab/add and slr with pt seeming to drift off at times to sleep/less engaged.   Shoulder Instructions      Home Living Family/patient expects to be discharged to:: Private residence Living Arrangements: Children;Spouse/significant other Available Help at Discharge: Family;Available 24 hours/day   Home Access: Stairs to enter Entrance Stairs-Number of Steps: 3 Entrance Stairs-Rails: None Home Layout: One level     Bathroom Shower/Tub: Tub/shower unit;Curtain   FirefighterBathroom Toilet: Standard     Home Equipment: Cane - single point;Walker - 2 wheels;Grab bars - tub/shower;Shower seat          Prior Functioning/Environment Level of Independence: Independent with assistive device(s)        Comments: patient has never driven        OT Problem List: Decreased strength;Decreased knowledge of use of DME or AE;Decreased range of motion;Decreased activity tolerance;Impaired balance (sitting and/or standing);Decreased safety awareness      OT Treatment/Interventions: Self-care/ADL training;Therapeutic exercise;Patient/family education;Neuromuscular education;Balance training;Therapeutic activities;DME and/or AE instruction;Cognitive remediation/compensation    OT  Goals(Current goals can be found in the care plan section) Acute Rehab OT Goals Patient Stated Goal: Patient reports she would like to go home with her family OT Goal Formulation: With patient Time For Goal Achievement: 08/01/19 Potential to Achieve Goals: Good ADL Goals Pt Will Perform Lower Body Dressing: with min assist Pt Will Transfer to Toilet: with min assist  OT Frequency: Min 1X/week   Barriers to D/C:            Co-evaluation PT/OT/SLP Co-Evaluation/Treatment: Yes Reason for Co-Treatment: For patient/therapist safety PT goals addressed during session: Mobility/safety with mobility;Balance OT goals addressed during session: ADL's and self-care      AM-PAC OT "6 Clicks" Daily Activity     Outcome Measure Help from another person eating meals?: None Help from another person taking care of personal grooming?: A Little Help from another person toileting, which includes using toliet, bedpan, or urinal?: A Lot Help from another person bathing (including washing, rinsing, drying)?: A Lot Help from another person to put on and taking off regular upper body clothing?: A Little Help from another person to put on and taking off regular lower body clothing?: A Lot 6 Click Score: 16   End of Session Equipment Utilized During Treatment: Gait  belt;Rolling walker  Activity Tolerance: Patient tolerated treatment well;Patient limited by lethargy Patient left: in chair;with chair alarm set  OT Visit Diagnosis: Unsteadiness on feet (R26.81);Muscle weakness (generalized) (M62.81)                Time: 1000-1025 OT Time Calculation (min): 25 min Charges:  OT General Charges $OT Visit: 1 Visit OT Evaluation $OT Eval Low Complexity: 1 Low OT Treatments $Self Care/Home Management : 8-22 mins  Kerrianne Jeng T Khylan Sawyer, OTR/L, CLT   Angellica Maddison 07/25/2019, 11:15 AM

## 2019-07-25 NOTE — Plan of Care (Signed)
  Problem: Education: Goal: Knowledge of General Education information will improve Description: Including pain rating scale, medication(s)/side effects and non-pharmacologic comfort measures Outcome: Progressing   Problem: Education: Goal: Knowledge of secondary prevention will improve Outcome: Progressing   Problem: Self-Care: Goal: Ability to communicate needs accurately will improve Outcome: Progressing   Problem: Ischemic Stroke/[name] Tissue Perfusion: Goal: Complications of ischemic stroke/[name] will be minimized Outcome: Progressing

## 2019-07-25 NOTE — Discharge Summary (Addendum)
Physician Discharge Summary  [name] ZOX:[id number] DOB: 1966/01/26 DOA: 07/23/2019  PCP: [facility]  Admit date: 07/23/2019 Discharge date: 07/26/2019  Recommendations for Outpatient Follow-up:  1. Follow up with PCP in 2 weeks and neurologist in 3 weeks 2. Please obtain BMP/CBC in one week 3.   Home Health: HH PT and HH OT Equipment/Devices: rolling walker 5" wheels  Discharge Condition: stable CODE STATUS: full  Diet recommendation: heart/carb modified diet  Brief/Interim Summary (HPI):  [name] is a 53 y.o. female with Past medical history of depression, type II DM, seizures, HTN, CVA with residual left-sided weakness, HLD, incontinence of urine. Patient was brought in by family to the hospital because of a fall and confusion. Reportedly patient was seen recently for a UTI and was on Bactrim. Patient has been having generalized weakness as well as difficulty speaking for last 1 week and off and on noticing some confusion. Patient had a fall the other day as her legs were giving away. This morning she had another fall when she fell that she was not able to lift her leg but her husband thought she should be able to walk and made her walk. She denies having any head injury or neck injury.  Denies any passing out episode. Denies any chest pain chest tightness denies any shortness of breath denies any cough denies any fever.  No burning urination.  No diarrhea no constipation. Patient is compliant with all her medications. Her sugars has been running anywhere from 200-300 on the high side to 50s and 60s on the low side for last couple of days. There is also reported poor p.o. intake. No other change in medications reported.  ED Course: Presents with a fall and generalized weakness. Work-up shows that she has possible UTI as well as elevated troponin. There is also concern for CHF. Patient was referred for admission for UTI and CHF.  Work-up that I  performed identified acute lacunar infarct as well.  At her baseline ambulates with assistance independent for most of her ADL;  manages her medication on her own.  Discharge Diagnoses and Hospital Course:   Principal Problem:   Lacunar stroke, acute (HCC) Active Problems:   HTN (hypertension)   Diabetes mellitus without complication (HCC)   Hyperlipidemia   History of stroke   Urinary tract infection   Acute metabolic encephalopathy   Seizure (HCC)   Elevated troponin   AKI (acute kidney injury) (HCC)   Fall   Lacunar stroke, acute [facility]): MRI showed acute lacunar infarct in the left lentiform/external capsule. Neuro was consulted, Dr. Tomi Bamberger evalated pt. He recommended stroke work-up. US carotid 11/06: Bilateral carotid bifurcation plaque resulting in less than 50% diameter ICA stenosis.Antegrade bilateral vertebral arterial flow. ECHO showed EF 60-65%. MRA showed milldly degraded by motion despite repeated imaging attempts. 2. Chronic intracranial atherosclerosis with progression in the Right ICA siphon since the 2017 MRA. Up to moderate multifocal right siphon stenosis now. 3. Stable up to moderate Right M1 and mild bilateral A1 segment stenoses. 4. Stable and negative posterior circulation, non dominant right vertebral artery which functionally terminates in PICA. I discussed with IR, Dr. Loreta Ave on the phone. Both neurologist Dr. Tomi Bamberger and Dr. Loreta Ave recommeded medical treatment.  - ASA and Lipitor 40 mg daily - HH PT/OT  Diabetes mellitus without complication: Last A1c 11.3 on 04/16/19, poorly controled. Patient is taking Levemir 40 units daily and NovoLog at home -continue home med  Possible UTI: pt has positive urinalysis  and urinary frequency. -treated with ceftriaxone in hospital -->will switch to cipro for 3 more day at discharge  Addendum: forgot to order Cipro on discharge, reordered and sent to pharm in the early AM 11/8. Cipro 500 mg bid for 3  days.  Seizure -Continue Home medications:  Depakote  Acute kidney injury: Baseline serum creatinine 1.0-1.06. Current serum creatinine 1.59 -->0.93. back to baseline.Likely secondary to Bactrim as well as ongoing use of lisinopril or just HCTZ combination in a patient who is having poor p.o. intake for at least last 1 week.  Elevated troponin: no CP. Possible demand ischemia. Troponin levels were 1301 -->1000 -->251. No chest pain. Most likely demand ischemia. -will be on ASA and lipitor  Recurrent falls: Likely multifactorial. CT scan as well as MRI were negative for any acute hemorrhage or acute fracture. -PT OT  Essential hypertension. -continue home meds  Urinary incontinence. -Continue Vesicare for now.     Discharge Instructions  Discharge Instructions    Call MD for:  difficulty breathing, headache or visual disturbances   Complete by: As directed    Call MD for:  persistant dizziness or light-headedness   Complete by: As directed    Call MD for:  persistant nausea and vomiting   Complete by: As directed    Increase activity slowly   Complete by: As directed      Allergies as of 07/25/2019   No Known Allergies     Medication List    STOP taking these medications   etodolac 400 MG tablet Commonly known as: LODINE   sulfamethoxazole-trimethoprim 800-160 MG tablet Commonly known as: BACTRIM DS     TAKE these medications   aspirin 325 MG EC tablet Take 1 tablet (325 mg total) by mouth daily.   atorvastatin 40 MG tablet Commonly known as: LIPITOR Take 1 tablet (40 mg total) by mouth daily at 6 PM.   divalproex 250 MG DR tablet Commonly known as: DEPAKOTE Take 250-500 mg by mouth 2 (two) times daily. Take one tablet (250 mg) by mouth every morning and two tablets (500 mg) at bedtime   gabapentin 300 MG capsule Commonly known as: NEURONTIN Take 300-900 mg by mouth 2 (two) times daily. Take one capsule (300 mg) by mouth every morning and two  capsules (600 mg) at bedtime. (May take an additional 300 mg at bedtime as needed)   glipiZIDE 5 MG 24 hr tablet Commonly known as: GLUCOTROL XL Take 1 tablet (5 mg total) by mouth daily.   insulin aspart 100 UNIT/ML FlexPen Commonly known as: NovoLOG FlexPen Inject 10 Units into the skin 3 (three) times daily with meals. What changed: how much to take   Insulin Detemir 100 UNIT/ML Pen Commonly known as: Levemir FlexPen Inject 40 Units into the skin daily at 10 pm. What changed: how much to take   lisinopril-hydrochlorothiazide 20-12.5 MG tablet Commonly known as: ZESTORETIC Take 1 tablet by mouth daily.   metFORMIN 500 MG tablet Commonly known as: GLUCOPHAGE Take 1 tablet (500 mg total) by mouth 2 (two) times daily with a meal.   metoprolol tartrate 25 MG tablet Commonly known as: LOPRESSOR Take 25 mg by mouth 2 (two) times a day.   VESIcare 10 MG tablet Generic drug: solifenacin Take 10 mg by mouth 2 (two) times daily.      Follow-up Information    [facility] Follow up in 2 week(s).   Specialty: General Practice Contact information: [address] [place]  [place] [address] [phone]        Marita SnellenGuerch, Meziane, MD Follow up in 3 week(s).   Specialty: Neurology Contact information: [address] [place] [address] [phone]          No Known Allergies  Consultations:     Procedures/Studies: Dg Tibia/fibula Left  Result Date: 07/20/2019 CLINICAL DATA:  Left leg pain EXAM: LEFT FEMUR 2 VIEWS; LEFT TIBIA AND FIBULA - 2 VIEW COMPARISON:  None. FINDINGS: There is no evidence of fracture or other focal bone lesions. Joint spaces of the left hip and ankle appear maintained. Mild joint space narrowing at the left knee. Mild diffuse soft tissue prominence. Scattered vascular calcifications. Soft tissues are unremarkable. IMPRESSION: 1. No acute osseous abnormality of the left femur or tibia-fibula. 2. Diffuse  lower extremity soft tissue prominence, nonspecific. 3. Vascular calcifications, greater than expected for patient's age. Electronically Signed   By: Duanne GuessNicholas  Plundo M.D.   On: 07/20/2019 14:50   Ct Head Wo Contrast  Result Date: 07/23/2019 CLINICAL DATA:  UTI, leg swelling EXAM: CT HEAD WITHOUT CONTRAST TECHNIQUE: Contiguous axial images were obtained from the base of the skull through the vertex without intravenous contrast. COMPARISON:  04/16/2019 FINDINGS: Brain: No evidence of acute infarction, hemorrhage, extra-axial collection, ventriculomegaly, or mass effect. 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 Generalized cerebral atrophy. Periventricular white matter low attenuation likely secondary to microangiopathy. Vascular: Cerebrovascular atherosclerotic calcifications are noted. Skull: Negative for fracture or focal lesion. Sinuses/Orbits: Visualized portions of the orbits are unremarkable. Visualized portions of the paranasal sinuses and mastoid air cells are unremarkable. Other: None. IMPRESSION: 1. No acute intracranial pathology. 2. 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 Electronically Signed   By: Elige KoHetal  Patel   On: 07/23/2019 13:17   Mr Angio Head Wo Contrast  Result Date: 07/24/2019 CLINICAL DATA:  53 year old female with altered mental status and chronic ischemic disease. Acute lacunar infarct of the left lentiform/external capsule on brain MRI yesterday. EXAM: MRA HEAD WITHOUT CONTRAST TECHNIQUE: Angiographic images of the Circle of Willis were obtained using MRA technique without intravenous contrast. COMPARISON:  Brain MRI 07/23/2019.  Intracranial MRA 08/14/2016. FINDINGS: Study is mildly degraded by motion artifact despite repeated imaging attempts. Antegrade flow in the posterior circulation is stable from the 2017 MRA with dominant left and diminutive right distal vertebral arteries. The right vertebral appears to functionally terminates in PICA as  before. The left vertebral supplies the basilar without stenosis. Patent basilar artery. Mild basilar irregularity without stenosis. Patent SCA and PCA origins. Bilateral PCA branches are stable since 2017 when allowing for motion. Antegrade flow in both ICA siphons also appears stable since 2017, with evidence of right greater than left siphon calcified plaque. Multifocal mild to moderate right ICA siphon stenosis appears increased, and maximal in the proximal supraclinoid segment. No left siphon stenosis. Patent carotid termini. Patent MCA and ACA origins. Mild chronic bilateral A1 stenosis. Visible ACA branches are otherwise within normal limits. Mild chronic left MCA M1 irregularity. The left MCA trifurcation appears stable. Left MCA branch detail is degraded by motion. Chronic mild-to-moderate right M1 irregularity and stenosis does not appear progressed. Right MCA bifurcation appears stable. Right MCA branch detail degraded by motion. IMPRESSION: 1. Mildly degraded by motion despite repeated imaging attempts. 2. Chronic intracranial atherosclerosis with progression in the Right ICA siphon since the 2017 MRA. Up to moderate multifocal right siphon stenosis now. 3. Stable up to moderate Right M1 and mild bilateral A1 segment stenoses. 4. Stable and negative posterior  circulation, non dominant right vertebral artery which functionally terminates in PICA. Electronically Signed   By: Odessa Fleming M.D.   On: 07/24/2019 13:52   Mr Brain Wo Contrast  Result Date: 07/23/2019 CLINICAL DATA:  53 year old female with unexplained altered mental status. Chronic ischemic disease. EXAM: MRI HEAD WITHOUT CONTRAST TECHNIQUE: Multiplanar, multiecho pulse sequences of the brain and surrounding structures were obtained without intravenous contrast. COMPARISON:  Brain MRI 04/16/2019. Head CT earlier today. FINDINGS: Brain: 9 millimeter lacunar infarct with restricted diffusion in the left lentiform/external capsule (series 5, image  24). Mild T2 and FLAIR hyperintensity with no associated hemorrhage or mass effect. A superimposed chronic microhemorrhage of the posterior left lentiform was present on the prior exam. No other restricted diffusion. Chronic right hemisphere encephalomalacia involving the posterior right MCA and some of the right PCA territory. Chronic lacunar infarcts in the pons. Stable gray and white matter signal findings above aside. From the acute finding above. No midline shift, mass effect, evidence of mass lesion, ventriculomegaly, extra-axial collection or acute intracranial hemorrhage. Cervicomedullary junction and pituitary are within normal limits. Vascular: Major intracranial vascular flow voids appear stable. Skull and upper cervical spine: Stable bone marrow signal. Upper cervical spine today obscured by motion. Sinuses/Orbits: Negative orbits. Paranasal sinuses are clear. Other: Mastoids remain clear. Negative scalp and face soft tissues. IMPRESSION: 1. Acute lacunar infarct in the left lentiform/external capsule. No acute hemorrhage or associated mass effect. 2. Underlying advanced chronic ischemic disease which is otherwise stable since July. Electronically Signed   By: Odessa Fleming M.D.   On: 07/23/2019 15:05   US Carotid Bilateral (at [facility] Only)  Result Date: 07/24/2019 CLINICAL DATA:  Stroke.  Hypertension, hyperlipidemia, diabetes. EXAM: BILATERAL CAROTID DUPLEX ULTRASOUND TECHNIQUE: Wallace Cullens scale imaging, color Doppler and duplex ultrasound were performed of bilateral carotid and vertebral arteries in the neck. COMPARISON:  05/19/2015 FINDINGS: Criteria: Quantification of carotid stenosis is based on velocity parameters that correlate the residual internal carotid diameter with NASCET-based stenosis levels, using the diameter of the distal internal carotid lumen as the denominator for stenosis measurement. The following velocity measurements were obtained: RIGHT ICA: 65/26 cm/sec CCA: 87/19 cm/sec SYSTOLIC  ICA/CCA RATIO:  0.7 ECA: 174 cm/sec LEFT ICA: 77/27 cm/sec CCA: 78/14 cm/sec SYSTOLIC ICA/CCA RATIO:  1.0 ECA: 104 cm/sec RIGHT CAROTID ARTERY: Intimal thickening in the common carotid artery. Partially calcified plaque in the bulb and proximal ICA. No high-grade stenosis. Normal waveforms and color Doppler signal. RIGHT VERTEBRAL ARTERY:  Normal flow direction and waveform. LEFT CAROTID ARTERY: Mild tortuosity. Calcified nonocclusive plaque in the common carotid artery. No high-grade stenosis. Normal waveforms and color Doppler signal. LEFT VERTEBRAL ARTERY:  Normal flow direction and waveform. IMPRESSION: 1. Bilateral carotid bifurcation plaque resulting in less than 50% diameter ICA stenosis. 2. Antegrade bilateral vertebral arterial flow. Electronically Signed   By: Corlis Leak M.D.   On: 07/24/2019 10:47   US Venous Img Lower Unilateral Left  Result Date: 07/20/2019 CLINICAL DATA:  53 year old with left lower extremity pain and swelling. EXAM: LEFT LOWER EXTREMITY VENOUS DOPPLER ULTRASOUND TECHNIQUE: Gray-scale sonography with graded compression, as well as color Doppler and duplex ultrasound were performed to evaluate the lower extremity deep venous systems from the level of the common femoral vein and including the common femoral, femoral, profunda femoral, popliteal and calf veins including the posterior tibial, peroneal and gastrocnemius veins when visible. The superficial great saphenous vein was also interrogated. Spectral Doppler was utilized to evaluate flow at rest and  with distal augmentation maneuvers in the common femoral, femoral and popliteal veins. COMPARISON:  None. FINDINGS: Contralateral Common Femoral Vein: Respiratory phasicity is normal and symmetric with the symptomatic side. No evidence of thrombus. Normal compressibility. Common Femoral Vein: No evidence of thrombus. Normal compressibility, respiratory phasicity and response to augmentation. Saphenofemoral Junction: No evidence of  thrombus. Normal compressibility and flow on color Doppler imaging. Profunda Femoral Vein: No evidence of thrombus. Normal compressibility and flow on color Doppler imaging. Femoral Vein: No evidence of thrombus. Normal compressibility, respiratory phasicity and response to augmentation. Popliteal Vein: No evidence of thrombus. Normal compressibility, respiratory phasicity and response to augmentation. Calf Veins: Visualized left deep calf veins are patent without thrombus. Other Findings:  None. IMPRESSION: Negative for deep venous thrombosis in left lower extremity. Electronically Signed   By: Markus Daft M.D.   On: 07/20/2019 16:34   Dg Chest Portable 1 View  Result Date: 07/23/2019 CLINICAL DATA:  Weakness. EXAM: PORTABLE CHEST 1 VIEW COMPARISON:  07/20/2019. FINDINGS: Cardiomegaly with pulmonary venous congestion. No focal infiltrate. Interval resolution of basilar atelectasis with improved aeration of both lungs from prior exam. No pleural effusion or pneumothorax. Degenerative change thoracic spine. IMPRESSION: Cardiomegaly with pulmonary venous congestion. No focal infiltrate. Interval resolution of basilar atelectasis with improved aeration from prior exam. Electronically Signed   By: Marcello Moores  Register   On: 07/23/2019 12:39   Dg Chest Portable 1 View  Result Date: 07/20/2019 CLINICAL DATA:  Weakness, hypertension EXAM: PORTABLE CHEST 1 VIEW COMPARISON:  04/16/2019 FINDINGS: The heart size and mediastinal contours are stable. Both lungs are clear. The visualized skeletal structures are unremarkable. IMPRESSION: No active disease.  No acute cardiopulmonary findings. Electronically Signed   By: Davina Poke M.D.   On: 07/20/2019 14:47   Dg Femur Min 2 Views Left  Result Date: 07/20/2019 CLINICAL DATA:  Left leg pain EXAM: LEFT FEMUR 2 VIEWS; LEFT TIBIA AND FIBULA - 2 VIEW COMPARISON:  None. FINDINGS: There is no evidence of fracture or other focal bone lesions. Joint spaces of the left hip and  ankle appear maintained. Mild joint space narrowing at the left knee. Mild diffuse soft tissue prominence. Scattered vascular calcifications. Soft tissues are unremarkable. IMPRESSION: 1. No acute osseous abnormality of the left femur or tibia-fibula. 2. Diffuse lower extremity soft tissue prominence, nonspecific. 3. Vascular calcifications, greater than expected for patient's age. Electronically Signed   By: Davina Poke M.D.   On: 07/20/2019 14:50     Subjective:   Discharge Exam: Vitals:   07/25/19 0353 07/25/19 0905  BP: 121/73 (!) 141/91  Pulse: 89 83  Resp:  16  Temp: (!) 97.4 F (36.3 C) 98.8 F (37.1 C)  SpO2: 94% 100%   Vitals:   07/25/19 0023 07/25/19 0027 07/25/19 0353 07/25/19 0905  BP: 117/70  121/73 (!) 141/91  Pulse: 91  89 83  Resp:    16  Temp: 98.7 F (37.1 C)  (!) 97.4 F (36.3 C) 98.8 F (37.1 C)  TempSrc: Oral  Oral Oral  SpO2: 91% 98% 94% 100%  Weight:   117.9 kg   Height:        General: Pt is alert, awake, not in acute distress Cardiovascular: RRR, S1/S2 +, no rubs, no gallops Respiratory: CTA bilaterally, no wheezing, no rhonchi Abdominal: Soft, NT, ND, bowel sounds + Extremities: no edema, no cyanosis    The results of significant diagnostics from this hospitalization (including imaging, microbiology, ancillary and laboratory) are listed below for reference.  Microbiology: Recent Results (from the past 240 hour(s))  Urine culture     Status: Abnormal   Collection Time: 07/20/19  3:07 PM   Specimen: Urine, Random  Result Value Ref Range Status   Specimen Description   Final    URINE, RANDOM Performed at [facility], [address], [place], [place] [address]    Special Requests   Final    Normal Performed at [facility], [address] [place], [place] [address]    Culture >=100,000 COLONIES/mL PROTEUS MIRABILIS (A)  Final   Report Status 07/23/2019 FINAL  Final   Organism ID, Bacteria PROTEUS MIRABILIS  (A)  Final      Susceptibility   Proteus mirabilis - MIC*    AMPICILLIN <=2 SENSITIVE Sensitive     CEFAZOLIN <=4 SENSITIVE Sensitive     CEFTRIAXONE <=1 SENSITIVE Sensitive     CIPROFLOXACIN <=0.25 SENSITIVE Sensitive     GENTAMICIN <=1 SENSITIVE Sensitive     IMIPENEM 1 SENSITIVE Sensitive     NITROFURANTOIN 128 RESISTANT Resistant     TRIMETH/SULFA <=20 SENSITIVE Sensitive     AMPICILLIN/SULBACTAM <=2 SENSITIVE Sensitive     PIP/TAZO <=4 SENSITIVE Sensitive     * >=100,000 COLONIES/mL PROTEUS MIRABILIS  Urine culture     Status: Abnormal (Preliminary result)   Collection Time: 07/23/19 12:08 PM   Specimen: Urine, Random  Result Value Ref Range Status   Specimen Description   Final    URINE, RANDOM Performed at [facility], [address], [place], [place] [address]    Special Requests   Final    NONE Performed at [facility], [address], [place], [place] [address]    Culture (A)  Final    >=100,000 COLONIES/mL GRAM NEGATIVE RODS IDENTIFICATION AND SUSCEPTIBILITIES TO FOLLOW Performed at [facility], [address], [place], [place] [address]    Report Status PENDING  Incomplete  SARS CORONAVIRUS 2 (TAT 6-24 HRS) Nasopharyngeal Nasopharyngeal Swab     Status: None   Collection Time: 07/23/19  1:20 PM   Specimen: Nasopharyngeal Swab  Result Value Ref Range Status   SARS Coronavirus 2 NEGATIVE NEGATIVE Final    Comment: (NOTE) SARS-CoV-2 target nucleic acids are NOT DETECTED. The SARS-CoV-2 RNA is generally detectable in upper and lower respiratory specimens during the acute phase of infection. Negative results do not preclude SARS-CoV-2 infection, do not rule out co-infections with other pathogens, and should not be used as the sole basis for treatment or other patient management decisions. Negative results must be combined with clinical observations, patient history, and epidemiological information. The expected result is  Negative. Fact Sheet for Patients: HairSlick.no Fact Sheet for Healthcare Providers: quierodirigir.com This test is not yet approved or cleared by the [place] FDA and  has been authorized for detection and/or diagnosis of SARS-CoV-2 by FDA under an Emergency Use Authorization (EUA). This EUA will remain  in effect (meaning this test can be used) for the duration of the COVID-19 declaration under Section 56 4(b)(1) of the Act, 21 U.S.C. section 360bbb-3(b)(1), unless the authorization is terminated or revoked sooner. Performed at [facility], [address], [place], [place] [address]      Labs: BNP (last 3 results) Recent Labs    07/23/19 1128  BNP 34.0   Basic Metabolic Panel: Recent Labs  Lab 07/20/19 1551 07/23/19 1128 07/25/19 0557  NA 141 141 142  K 3.6 3.7 4.0  CL 103 104 107  CO2 24 26 27   GLUCOSE 74 98 174*  BUN 23* 29* 18  CREATININE 1.06* 1.59* 0.93  CALCIUM 8.8* 8.9 8.5*   Liver Function Tests: Recent Labs  Lab 07/20/19 1551 07/23/19 1128  AST 21 26  ALT 11 13  ALKPHOS 34* 37*  BILITOT 1.0 0.7  PROT 7.0 7.4  ALBUMIN 3.6 3.7   No results for input(s): LIPASE, AMYLASE in the last 168 hours. No results for input(s): AMMONIA in the last 168 hours. CBC: Recent Labs  Lab 07/20/19 1551 07/23/19 1128 07/24/19 0241 07/25/19 0557  WBC 10.0 7.7 7.3 4.8  NEUTROABS 6.7  --   --   --   HGB 9.9* 10.9* 11.2* 10.3*  HCT 31.8* 35.2* 37.1 32.6*  MCV 63.2* 63.8* 64.5* 61.7*  PLT PLATELET CLUMPS NOTED ON SMEAR, UNABLE TO ESTIMATE 171 161 163   Cardiac Enzymes: Recent Labs  Lab 07/23/19 1513  CKTOTAL 242*   BNP: Invalid input(s): POCBNP CBG: Recent Labs  Lab 07/24/19 1208 07/24/19 1631 07/24/19 2212 07/25/19 0837 07/25/19 1155  GLUCAP 205* 230* 211* 149* 187*   D-Dimer No results for input(s): DDIMER in the last 72 hours. Hgb A1c Recent Labs    07/24/19 0241  HGBA1C 9.1*    Lipid Profile Recent Labs    07/24/19 0241  CHOL 169  HDL 50  LDLCALC 99  TRIG 100  CHOLHDL 3.4   Thyroid function studies No results for input(s): TSH, T4TOTAL, T3FREE, THYROIDAB in the last 72 hours.  Invalid input(s): FREET3 Anemia work up Recent Labs    07/24/19 0241  VITAMINB12 237  FERRITIN 53  TIBC 386  IRON 43   Urinalysis    Component Value Date/Time   COLORURINE YELLOW (A) 07/23/2019 1208   APPEARANCEUR CLOUDY (A) 07/23/2019 1208   APPEARANCEUR Clear 12/27/2014 1806   LABSPEC 1.018 07/23/2019 1208   LABSPEC 1.035 12/27/2014 1806   PHURINE 6.0 07/23/2019 1208   GLUCOSEU NEGATIVE 07/23/2019 1208   GLUCOSEU >=500 12/27/2014 1806   HGBUR NEGATIVE 07/23/2019 1208   BILIRUBINUR NEGATIVE 07/23/2019 1208   BILIRUBINUR Negative 12/27/2014 1806   KETONESUR NEGATIVE 07/23/2019 1208   PROTEINUR NEGATIVE 07/23/2019 1208   NITRITE POSITIVE (A) 07/23/2019 1208   LEUKOCYTESUR MODERATE (A) 07/23/2019 1208   LEUKOCYTESUR Negative 12/27/2014 1806   [facility] Invalid input(s): PROCALCITONIN,  WBC,  LACTICIDVEN Microbiology Recent Results (from the past 240 hour(s))  Urine culture     Status: Abnormal   Collection Time: 07/20/19  3:07 PM   Specimen: Urine, Random  Result Value Ref Range Status   Specimen Description   Final    URINE, RANDOM Performed at [facility], [address], [place], [place] [address]    Special Requests   Final    Normal Performed at [facility], [address] [place], [place] [address]    Culture >=100,000 COLONIES/mL PROTEUS MIRABILIS (A)  Final   Report Status 07/23/2019 FINAL  Final   Organism ID, Bacteria PROTEUS MIRABILIS (A)  Final      Susceptibility   Proteus mirabilis - MIC*    AMPICILLIN <=2 SENSITIVE Sensitive     CEFAZOLIN <=4 SENSITIVE Sensitive     CEFTRIAXONE <=1 SENSITIVE Sensitive     CIPROFLOXACIN <=0.25 SENSITIVE Sensitive     GENTAMICIN <=1 SENSITIVE Sensitive     IMIPENEM 1 SENSITIVE  Sensitive     NITROFURANTOIN 128 RESISTANT Resistant     TRIMETH/SULFA <=20 SENSITIVE Sensitive     AMPICILLIN/SULBACTAM <=2 SENSITIVE Sensitive  PIP/TAZO <=4 SENSITIVE Sensitive     * >=100,000 COLONIES/mL PROTEUS MIRABILIS  Urine culture     Status: Abnormal (Preliminary result)   Collection Time: 07/23/19 12:08 PM   Specimen: Urine, Random  Result Value Ref Range Status   Specimen Description   Final    URINE, RANDOM Performed at [facility], [address] [place], [place] [address]    Special Requests   Final    NONE Performed at [facility], [address] [place], [place] [address]    Culture (A)  Final    >=100,000 COLONIES/mL GRAM NEGATIVE RODS IDENTIFICATION AND SUSCEPTIBILITIES TO FOLLOW Performed at [facility], [address] [place], [place] [address]    Report Status PENDING  Incomplete  SARS CORONAVIRUS 2 (TAT 6-24 HRS) Nasopharyngeal Nasopharyngeal Swab     Status: None   Collection Time: 07/23/19  1:20 PM   Specimen: Nasopharyngeal Swab  Result Value Ref Range Status   SARS Coronavirus 2 NEGATIVE NEGATIVE Final    Comment: (NOTE) SARS-CoV-2 target nucleic acids are NOT DETECTED. The SARS-CoV-2 RNA is generally detectable in upper and lower respiratory specimens during the acute phase of infection. Negative results do not preclude SARS-CoV-2 infection, do not rule out co-infections with other pathogens, and should not be used as the sole basis for treatment or other patient management decisions. Negative results must be combined with clinical observations, patient history, and epidemiological information. The expected result is Negative. Fact Sheet for Patients: HairSlick.no Fact Sheet for Healthcare Providers: quierodirigir.com This test is not yet approved or cleared by the [place] FDA and  has been authorized for detection and/or diagnosis of SARS-CoV-2 by FDA  under an Emergency Use Authorization (EUA). This EUA will remain  in effect (meaning this test can be used) for the duration of the COVID-19 declaration under Section 56 4(b)(1) of the Act, 21 U.S.C. section 360bbb-3(b)(1), unless the authorization is terminated or revoked sooner. Performed at [facility], [address], [place], [place] [address]     Time coordinating discharge:  35  minutes  SIGNED:  Lorretta Harp, DO [facility] 07/26/2019, 6:36 AM Pager is on AMION  If 7PM-7AM, please contact night-coverage [email or web address] Password [email or web address]

## 2019-07-25 NOTE — Consult Note (Signed)
11/7: Patient on cahir , watching TV, states that she feels better and asking when she will be able to go home  ECHO per EMR  Neuro exam remains essentially unchanged and stable Alert, awake, oriented x2, speech is nle, follows commands PERLA, EOMI, vff seems full, left facial, face sensation nle to touch, uvula/tongue midline She is 5/5 on RUEX. 4/5 on RLEX and LUEX. She is 3/5 on LLEX No sensory deficit appreciated to touch Left dysmetria to FN DTRs and gait not checked at time of evaluation  RECS:  - -Neuro protectives measures while admityted including normothermia, normoglycemia, correct electrolytes/metabolic abnlities, treat any infection - PT/OT. Appreciate  recs and help - swallow eval. Appreciate recs and help - ASA/statin  - Continue divalproex - dvt prophylaxis    11/6: Patient resting on bed  US carotid 11/06:Bilateral carotid bifurcation plaque resulting in less than 50% diameter ICA stenosis.Antegrade bilateral vertebral arterial flow. ECHO pending  Neuro exam remains essentially unchanged and stable Alert, awake, oriented x2, speech is nle, follows commands PERLA, EOMI, vff seems full, left facial, face sensation nle to touch, uvula/tongue midline She is 5/5 on RUEX. 4/5 on RLEX and LUEX. She is 3/5 on LLEX No sensory deficit appreciated to touch Left dysmetria to FN DTRs and gait not checked at time of evaluation  RECS:  - -Neuro protectives measures while admityted including normothermia, normoglycemia, correct electrolytes/metabolic abnlities, treat any infection - Stroke w/up echo/US. Will f/up - PT/OT. Appreciate  recs and help - swallow eval - ASA/statin if no CI and not on it. - dvt prophylaxis - Will follow up with you     INITIAL CONSULT 11/5 Reason for Consult: Acute lacunar infarct in the left lentiform/external capsule Referring Physician: Hospitalist    HPI: [name] is an 53 y.o. 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 She states she had a bout of dizziness. She c/o weakness on both legs, no sz like activity, no visual disturbance, no HA,  Patient started on rocephin for UTI. Labs per EMR: Head ct: No acute intracranial pathology.Old right temporoparietal infarct with encephalomalacia. MRI: Acute lacunar infarct in the left lentiform/external capsule. No acute hemorrhage or associated mass effect.Underlying advanced chronic ischemic disease which is otherwise stable since July.  Past Medical History:  Diagnosis Date  . Allergic rhinitis   . Chickenpox   . Depression   . Diabetes (HCC)   . Epilepsy (HCC)   . Headache   . HTN (hypertension)   . Hyperlipidemia   . Seizures (HCC)   . Stroke (HCC)   . Urinary incontinence     Past Surgical History:  Procedure Laterality Date  . CESAREAN SECTION    . INCISION AND DRAINAGE Right 05/13/2016   Procedure: INCISION AND DRAINAGE;  Surgeon: Gwyneth Revels, DPM;  Location: [facility] ORS;  Service: Podiatry;  Laterality: Right;    Family History  Problem Relation Age of Onset  . Hypertension Mother   . Diabetes Mellitus II Mother   . Hypertension Father   . Pancreatic cancer Father     Social History:  reports that she has never smoked. She has never used smokeless tobacco. She reports that she does not drink alcohol or use drugs.  No Known Allergies  Medications: I have reviewed the  patient's current medications.  ROS: *as per hpi Physical Examination: Blood pressure (!) 141/91, pulse 83, temperature 98.8 F (37.1 C), temperature source Oral, resp. rate 16, height 5\' 2"  (1.575 m), weight 117.9 kg, SpO2 100 %.  Neurologic Examination Alert, awake, oriented x2, speech is nle, follows commands PERLA< EOMI, vff seems full, left facial, face  sensation nle to touch, uvula/tongue midline She is 5/5 on RUEX. 4/5 on RLEX and LUEX. She is 3/5 on LLEX No sensory deficit appreciated to touch Left dysmetria to FN DTRs and gait not checked at time of evaluation  Results for orders placed or performed during the hospital encounter of 07/23/19 (from the past 48 hour(s))  Basic metabolic panel     Status: Abnormal   Collection Time: 07/23/19 11:28 AM  Result Value Ref Range   Sodium 141 135 - 145 mmol/L   Potassium 3.7 3.5 - 5.1 mmol/L   Chloride 104 98 - 111 mmol/L   CO2 26 22 - 32 mmol/L   Glucose, Bld 98 70 - 99 mg/dL   BUN 29 (H) 6 - 20 mg/dL   Creatinine, Ser 1.61 (H) 0.44 - 1.00 mg/dL   Calcium 8.9 8.9 - 09.6 mg/dL   GFR calc non Af Amer 37 (L) >60 mL/min   GFR calc Af Amer 43 (L) >60 mL/min   Anion gap 11 5 - 15    Comment: Performed at [facility], [address] [place], [place] [address]  CBC     Status: Abnormal   Collection Time: 07/23/19 11:28 AM  Result Value Ref Range   WBC 7.7 4.0 - 10.5 K/uL   RBC 5.52 (H) 3.87 - 5.11 MIL/uL   Hemoglobin 10.9 (L) 12.0 - 15.0 g/dL   HCT 98.1 (L) 19.1 - 47.8 %   MCV 63.8 (L) 80.0 - 100.0 fL   MCH 19.7 (L) 26.0 - 34.0 pg   MCHC 31.0 30.0 - 36.0 g/dL   RDW 29.5 (H) 62.1 - 30.8 %   Platelets 171 150 - 400 K/uL   nRBC 0.0 0.0 - 0.2 %    Comment: Performed at [facility], [address] [place], [place] [address]  Hepatic function panel     Status: Abnormal   Collection Time: 07/23/19 11:28 AM  Result Value Ref Range   Total Protein 7.4 6.5 - 8.1 g/dL   Albumin 3.7 3.5 - 5.0 g/dL   AST 26 15 - 41 U/L   ALT 13 0 - 44 U/L   Alkaline Phosphatase 37 (L) 38 - 126 U/L   Total Bilirubin 0.7 0.3 - 1.2 mg/dL   Bilirubin, Direct <6.9 0.0 - 0.2 mg/dL   Indirect Bilirubin NOT CALCULATED 0.3 - 0.9 mg/dL    Comment: Performed at [facility], [address] [place], [place] [address]  Troponin I (High Sensitivity)     Status: Abnormal   Collection Time:  07/23/19 11:28 AM  Result Value Ref Range   Troponin I (High Sensitivity) 1,313 (HH) <18 ng/L    Comment: CRITICAL RESULT CALLED TO, READ BACK BY AND VERIFIED WITH JESSICA FULCHER AT 1253 ON 07/23/2019 JJB (NOTE) Elevated high sensitivity troponin I (hsTnI) values and significant  changes across serial measurements may suggest ACS but many other  chronic and acute conditions are known to elevate hsTnI results.  Refer to the Links section for chest pain algorithms and additional  guidance. Performed at [facility], [address] [place], [place] [address]   Brain natriuretic  peptide     Status: None   Collection Time: 07/23/19 11:28 AM  Result Value Ref Range   B Natriuretic Peptide 34.0 0.0 - 100.0 pg/mL    Comment: Performed at [facility], [address] [place], [place] [address]  Glucose, capillary     Status: None   Collection Time: 07/23/19 11:31 AM  Result Value Ref Range   Glucose-Capillary 83 70 - 99 mg/dL  Urinalysis, Complete w Microscopic     Status: Abnormal   Collection Time: 07/23/19 12:08 PM  Result Value Ref Range   Color, Urine YELLOW (A) YELLOW   APPearance CLOUDY (A) CLEAR   Specific Gravity, Urine 1.018 1.005 - 1.030   pH 6.0 5.0 - 8.0   Glucose, UA NEGATIVE NEGATIVE mg/dL   Hgb urine dipstick NEGATIVE NEGATIVE   Bilirubin Urine NEGATIVE NEGATIVE   Ketones, ur NEGATIVE NEGATIVE mg/dL   Protein, ur NEGATIVE NEGATIVE mg/dL   Nitrite POSITIVE (A) NEGATIVE   Leukocytes,Ua MODERATE (A) NEGATIVE   RBC / HPF 6-10 0 - 5 RBC/hpf   WBC, UA 6-10 0 - 5 WBC/hpf   Bacteria, UA MANY (A) NONE SEEN   Squamous Epithelial / LPF 11-20 0 - 5   Mucus PRESENT     Comment: Performed at [facility], [address] [place], [place] [address]  Urine culture     Status: Abnormal (Preliminary result)   Collection Time: 07/23/19 12:08 PM   Specimen: Urine, Random  Result Value Ref Range   Specimen Description      URINE, RANDOM Performed at  [facility], [address] [place], [place] [address]    Special Requests      NONE Performed at [facility], [address] [place], [place] [address]    Culture (A)     >=100,000 COLONIES/mL GRAM NEGATIVE RODS IDENTIFICATION AND SUSCEPTIBILITIES TO FOLLOW Performed at [facility], [address] [place], [place] [address]    Report Status PENDING   Pregnancy, urine POC     Status: None   Collection Time: 07/23/19 12:13 PM  Result Value Ref Range   Preg Test, Ur NEGATIVE NEGATIVE    Comment:        THE SENSITIVITY OF THIS METHODOLOGY IS >24 mIU/mL   SARS CORONAVIRUS 2 (TAT 6-24 HRS) Nasopharyngeal Nasopharyngeal Swab     Status: None   Collection Time: 07/23/19  1:20 PM   Specimen: Nasopharyngeal Swab  Result Value Ref Range   SARS Coronavirus 2 NEGATIVE NEGATIVE    Comment: (NOTE) SARS-CoV-2 target nucleic acids are NOT DETECTED. The SARS-CoV-2 RNA is generally detectable in upper and lower respiratory specimens during the acute phase of infection. Negative results do not preclude SARS-CoV-2 infection, do not rule out co-infections with other pathogens, and should not be used as the sole basis for treatment or other patient management decisions. Negative results must be combined with clinical observations, patient history, and epidemiological information. The expected result is Negative. Fact Sheet for Patients: HairSlick.no Fact Sheet for Healthcare Providers: quierodirigir.com This test is not yet approved or cleared by the [place] FDA and  has been authorized for detection and/or diagnosis of SARS-CoV-2 by FDA under an Emergency Use Authorization (EUA). This EUA will remain  in effect (meaning this test can be used) for the duration of the COVID-19 declaration under Section 56 4(b)(1) of the Act, 21 U.S.C. section 360bbb-3(b)(1), unless the authorization is terminated  or revoked sooner. Performed at [facility], [address], [place], [place] [address]   CK     Status: Abnormal   Collection Time: 07/23/19  3:13 PM  Result Value Ref Range   Total CK 242 (H) 38 - 234 U/L    Comment: Performed at [facility], [address] [place], [place] [address]  Valproic acid level     Status: None   Collection Time: 07/23/19  3:13 PM  Result Value Ref Range   Valproic Acid Lvl 64 50.0 - 100.0 ug/mL    Comment: Performed at [facility], [address] [place], [place] [address]  APTT     Status: None   Collection Time: 07/23/19  3:13 PM  Result Value Ref Range   aPTT 31 24 - 36 seconds    Comment: Performed at [facility], [address] [place], [place] [address]  Protime-INR     Status: None   Collection Time: 07/23/19  3:13 PM  Result Value Ref Range   Prothrombin Time 13.5 11.4 - 15.2 seconds   INR 1.0 0.8 - 1.2    Comment: (NOTE) INR goal varies based on device and disease states. Performed at [facility], [address] [place], [place] [address]   Glucose, capillary     Status: Abnormal   Collection Time: 07/24/19 12:17 AM  Result Value Ref Range   Glucose-Capillary 182 (H) 70 - 99 mg/dL  Troponin I (High Sensitivity)     Status: Abnormal   Collection Time: 07/24/19  2:41 AM  Result Value Ref Range   Troponin I (High Sensitivity) 1,000 (HH) <18 ng/L    Comment: CRITICAL VALUE NOTED. VALUE IS CONSISTENT WITH PREVIOUSLY REPORTED/CALLED VALUE SRC (NOTE) Elevated high sensitivity troponin I (hsTnI) values and significant  changes across serial measurements may suggest ACS but many other  chronic and acute conditions are known to elevate hsTnI results.  Refer to the "Links" section for chest pain algorithms and additional  guidance. Performed at [facility], [address] [place], [place] [address]   Iron and TIBC     Status: None   Collection Time: 07/24/19  2:41 AM   Result Value Ref Range   Iron 43 28 - 170 ug/dL   TIBC 578 469 - 629 ug/dL   Saturation Ratios 11 10.4 - 31.8 %   UIBC 343 ug/dL    Comment: Performed at [facility], [address] [place], [place] [address]  Ferritin     Status: None   Collection Time: 07/24/19  2:41 AM  Result Value Ref Range   Ferritin 53 11 - 307 ng/mL    Comment: Performed at [facility], [address] [place] [address] [place], [place] [address]  Vitamin B12     Status: None   Collection Time: 07/24/19  2:41 AM  Result Value Ref Range   Vitamin B-12 237 180 - 914 pg/mL    Comment: (NOTE) This assay is not validated for testing neonatal or myeloproliferative syndrome specimens for Vitamin B12 levels. Performed at [facility], [address], [place], [place] [address]   CBC     Status: Abnormal   Collection Time: 07/24/19  2:41 AM  Result Value Ref Range   WBC 7.3 4.0 - 10.5 K/uL   RBC 5.75 (H) 3.87 - 5.11 MIL/uL   Hemoglobin 11.2 (L) 12.0 - 15.0 g/dL   HCT 53.6 64.4 - 03.4 %   MCV 64.5 (L) 80.0 - 100.0 fL  MCH 19.5 (L) 26.0 - 34.0 pg   MCHC 30.2 30.0 - 36.0 g/dL   RDW 16.119.9 (H) 09.611.5 - 04.515.5 %   Platelets 161 150 - 400 K/uL   nRBC 0.0 0.0 - 0.2 %    Comment: Performed at [facility], [address] [place], [place] [address]  HIV Antibody (routine testing w rflx)     Status: None   Collection Time: 07/24/19  2:41 AM  Result Value Ref Range   HIV Screen 4th Generation wRfx NON REACTIVE NON REACTIVE    Comment: Performed at [facility], [address] [place], [place] [address]  Hemoglobin A1c     Status: Abnormal   Collection Time: 07/24/19  2:41 AM  Result Value Ref Range   Hgb A1c MFr Bld 9.1 (H) 4.8 - 5.6 %    Comment: (NOTE) Pre diabetes:          5.7%-6.4% Diabetes:              >6.4% Glycemic control for   <7.0% adults with diabetes    Mean Plasma Glucose 214.47 mg/dL    Comment: Performed at [facility], [address] [place], [place] [address]   Lipid panel     Status: None   Collection Time: 07/24/19  2:41 AM  Result Value Ref Range   Cholesterol 169 0 - 200 mg/dL   Triglycerides 213100 <086<150 mg/dL   HDL 50 >57>40 mg/dL   Total CHOL/HDL Ratio 3.4 RATIO   VLDL 20 0 - 40 mg/dL   LDL Cholesterol 99 0 - 99 mg/dL    Comment:        Total Cholesterol/HDL:CHD Risk Coronary Heart Disease Risk Table                     Men   Women  1/2 Average Risk   3.4   3.3  Average Risk       5.0   4.4  2 X Average Risk   9.6   7.1  3 X Average Risk  23.4   11.0        Use the calculated Patient Ratio above and the CHD Risk Table to determine the patient's CHD Risk.        ATP III CLASSIFICATION (LDL):  <100     mg/dL   Optimal  [phone]  mg/dL   Near or Above                    Optimal  130-159  mg/dL   Borderline  [phone]  mg/dL   High  >324>190     mg/dL   Very High Performed at [facility], [address] [place], [place] [address]   Glucose, capillary     Status: Abnormal   Collection Time: 07/24/19  7:37 AM  Result Value Ref Range   Glucose-Capillary 222 (H) 70 - 99 mg/dL  Glucose, capillary     Status: Abnormal   Collection Time: 07/24/19 12:08 PM  Result Value Ref Range   Glucose-Capillary 205 (H) 70 - 99 mg/dL  Glucose, capillary     Status: Abnormal   Collection Time: 07/24/19  4:31 PM  Result Value Ref Range   Glucose-Capillary 230 (H) 70 - 99 mg/dL  Troponin I (High Sensitivity)     Status: Abnormal   Collection Time: 07/24/19  6:48 PM  Result Value Ref Range   Troponin I (High Sensitivity) 521 (HH) <  18 ng/L    Comment: CRITICAL VALUE NOTED. VALUE IS CONSISTENT WITH PREVIOUSLY REPORTED/CALLED VALUE MJU (NOTE) Elevated high sensitivity troponin I (hsTnI) values and significant  changes across serial measurements may suggest ACS but many other  chronic and acute conditions are known to elevate hsTnI results.  Refer to the "Links" section for chest pain algorithms and additional  guidance. Performed at [facility], [address] [place], [place] [address]   Glucose, capillary     Status: Abnormal   Collection Time: 07/24/19 10:12 PM  Result Value Ref Range   Glucose-Capillary 211 (H) 70 - 99 mg/dL  AM - BMET     Status: Abnormal   Collection Time: 07/25/19  5:57 AM  Result Value Ref Range   Sodium 142 135 - 145 mmol/L   Potassium 4.0 3.5 - 5.1 mmol/L   Chloride 107 98 - 111 mmol/L   CO2 27 22 - 32 mmol/L   Glucose, Bld 174 (H) 70 - 99 mg/dL   BUN 18 6 - 20 mg/dL   Creatinine, Ser 6.04 0.44 - 1.00 mg/dL   Calcium 8.5 (L) 8.9 - 10.3 mg/dL   GFR calc non Af Amer >60 >60 mL/min   GFR calc Af Amer >60 >60 mL/min   Anion gap 8 5 - 15    Comment: Performed at [facility], [address] [place], [place] [address]  AM - CBC     Status: Abnormal   Collection Time: 07/25/19  5:57 AM  Result Value Ref Range   WBC 4.8 4.0 - 10.5 K/uL   RBC 5.28 (H) 3.87 - 5.11 MIL/uL   Hemoglobin 10.3 (L) 12.0 - 15.0 g/dL   HCT 11.9 (L) 14.7 - 82.9 %   MCV 61.7 (L) 80.0 - 100.0 fL   MCH 19.5 (L) 26.0 - 34.0 pg   MCHC 31.6 30.0 - 36.0 g/dL   RDW 56.2 (H) 13.0 - 86.5 %   Platelets 163 150 - 400 K/uL   nRBC 0.0 0.0 - 0.2 %    Comment: Performed at [facility], [address] [place], [place] [address]  Glucose, capillary     Status: Abnormal   Collection Time: 07/25/19  8:37 AM  Result Value Ref Range   Glucose-Capillary 149 (H) 70 - 99 mg/dL   Comment 1 Notify RN    Comment 2 Document in Chart     Recent Results (from the past 240 hour(s))  Urine culture     Status: Abnormal   Collection Time: 07/20/19  3:07 PM   Specimen: Urine, Random  Result Value Ref Range Status   Specimen Description   Final    URINE, RANDOM Performed at [facility], [address], [place], [place] [address]    Special Requests   Final    Normal Performed at [facility], [address] [place], [place] [address]    Culture >=100,000 COLONIES/mL PROTEUS MIRABILIS  (A)  Final   Report Status 07/23/2019 FINAL  Final   Organism ID, Bacteria PROTEUS MIRABILIS (A)  Final      Susceptibility   Proteus mirabilis - MIC*    AMPICILLIN <=2 SENSITIVE Sensitive     CEFAZOLIN <=4 SENSITIVE Sensitive     CEFTRIAXONE <=1 SENSITIVE Sensitive     CIPROFLOXACIN <=0.25 SENSITIVE Sensitive     GENTAMICIN <=1 SENSITIVE Sensitive     IMIPENEM 1 SENSITIVE Sensitive     NITROFURANTOIN 128 RESISTANT Resistant     TRIMETH/SULFA <=  20 SENSITIVE Sensitive     AMPICILLIN/SULBACTAM <=2 SENSITIVE Sensitive     PIP/TAZO <=4 SENSITIVE Sensitive     * >=100,000 COLONIES/mL PROTEUS MIRABILIS  Urine culture     Status: Abnormal (Preliminary result)   Collection Time: 07/23/19 12:08 PM   Specimen: Urine, Random  Result Value Ref Range Status   Specimen Description   Final    URINE, RANDOM Performed at [facility], [address] [place], [place] [address]    Special Requests   Final    NONE Performed at [facility], [address] [place], [place] [address]    Culture (A)  Final    >=100,000 COLONIES/mL GRAM NEGATIVE RODS IDENTIFICATION AND SUSCEPTIBILITIES TO FOLLOW Performed at [facility], [address], [place], [place] [address]    Report Status PENDING  Incomplete  SARS CORONAVIRUS 2 (TAT 6-24 HRS) Nasopharyngeal Nasopharyngeal Swab     Status: None   Collection Time: 07/23/19  1:20 PM   Specimen: Nasopharyngeal Swab  Result Value Ref Range Status   SARS Coronavirus 2 NEGATIVE NEGATIVE Final    Comment: (NOTE) SARS-CoV-2 target nucleic acids are NOT DETECTED. The SARS-CoV-2 RNA is generally detectable in upper and lower respiratory specimens during the acute phase of infection. Negative results do not preclude SARS-CoV-2 infection, do not rule out co-infections with other pathogens, and should not be used as the sole basis for treatment or other patient management decisions. Negative results must be combined with clinical  observations, patient history, and epidemiological information. The expected result is Negative. Fact Sheet for Patients: HairSlick.no Fact Sheet for Healthcare Providers: quierodirigir.com This test is not yet approved or cleared by the [place] FDA and  has been authorized for detection and/or diagnosis of SARS-CoV-2 by FDA under an Emergency Use Authorization (EUA). This EUA will remain  in effect (meaning this test can be used) for the duration of the COVID-19 declaration under Section 56 4(b)(1) of the Act, 21 U.S.C. section 360bbb-3(b)(1), unless the authorization is terminated or revoked sooner. Performed at [facility], [address] [place], [place] [address]     Ct Head Wo Contrast  Result Date: 07/23/2019 CLINICAL DATA:  UTI, leg swelling EXAM: CT HEAD WITHOUT CONTRAST TECHNIQUE: Contiguous axial images were obtained from the base of the skull through the vertex without intravenous contrast. COMPARISON:  04/16/2019 FINDINGS: Brain: No evidence of acute infarction, hemorrhage, extra-axial collection, ventriculomegaly, or mass effect. 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 Generalized cerebral atrophy. Periventricular white matter low attenuation likely secondary to microangiopathy. Vascular: Cerebrovascular atherosclerotic calcifications are noted. Skull: Negative for fracture or focal lesion. Sinuses/Orbits: Visualized portions of the orbits are unremarkable. Visualized portions of the paranasal sinuses and mastoid air cells are unremarkable. Other: None. IMPRESSION: 1. No acute intracranial pathology. 2. 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 Electronically Signed   By: Elige Ko   On: 07/23/2019 13:17   Mr Angio Head Wo Contrast  Result Date: 07/24/2019 CLINICAL DATA:  53 year old female with altered mental status and chronic ischemic disease. Acute lacunar infarct of the  left lentiform/external capsule on brain MRI yesterday. EXAM: MRA HEAD WITHOUT CONTRAST TECHNIQUE: Angiographic images of the Circle of Willis were obtained using MRA technique without intravenous contrast. COMPARISON:  Brain MRI 07/23/2019.  Intracranial MRA 08/14/2016. FINDINGS: Study is mildly degraded by motion artifact despite repeated imaging attempts. Antegrade flow in the posterior circulation is stable from the 2017 MRA with dominant left and diminutive right distal vertebral  arteries. The right vertebral appears to functionally terminates in PICA as before. The left vertebral supplies the basilar without stenosis. Patent basilar artery. Mild basilar irregularity without stenosis. Patent SCA and PCA origins. Bilateral PCA branches are stable since 2017 when allowing for motion. Antegrade flow in both ICA siphons also appears stable since 2017, with evidence of right greater than left siphon calcified plaque. Multifocal mild to moderate right ICA siphon stenosis appears increased, and maximal in the proximal supraclinoid segment. No left siphon stenosis. Patent carotid termini. Patent MCA and ACA origins. Mild chronic bilateral A1 stenosis. Visible ACA branches are otherwise within normal limits. Mild chronic left MCA M1 irregularity. The left MCA trifurcation appears stable. Left MCA branch detail is degraded by motion. Chronic mild-to-moderate right M1 irregularity and stenosis does not appear progressed. Right MCA bifurcation appears stable. Right MCA branch detail degraded by motion. IMPRESSION: 1. Mildly degraded by motion despite repeated imaging attempts. 2. Chronic intracranial atherosclerosis with progression in the Right ICA siphon since the 2017 MRA. Up to moderate multifocal right siphon stenosis now. 3. Stable up to moderate Right M1 and mild bilateral A1 segment stenoses. 4. Stable and negative posterior circulation, non dominant right vertebral artery which functionally terminates in PICA.  Electronically Signed   By: Odessa Fleming M.D.   On: 07/24/2019 13:52   Mr Brain Wo Contrast  Result Date: 07/23/2019 CLINICAL DATA:  53 year old female with unexplained altered mental status. Chronic ischemic disease. EXAM: MRI HEAD WITHOUT CONTRAST TECHNIQUE: Multiplanar, multiecho pulse sequences of the brain and surrounding structures were obtained without intravenous contrast. COMPARISON:  Brain MRI 04/16/2019. Head CT earlier today. FINDINGS: Brain: 9 millimeter lacunar infarct with restricted diffusion in the left lentiform/external capsule (series 5, image 24). Mild T2 and FLAIR hyperintensity with no associated hemorrhage or mass effect. A superimposed chronic microhemorrhage of the posterior left lentiform was present on the prior exam. No other restricted diffusion. Chronic right hemisphere encephalomalacia involving the posterior right MCA and some of the right PCA territory. Chronic lacunar infarcts in the pons. Stable gray and white matter signal findings above aside. From the acute finding above. No midline shift, mass effect, evidence of mass lesion, ventriculomegaly, extra-axial collection or acute intracranial hemorrhage. Cervicomedullary junction and pituitary are within normal limits. Vascular: Major intracranial vascular flow voids appear stable. Skull and upper cervical spine: Stable bone marrow signal. Upper cervical spine today obscured by motion. Sinuses/Orbits: Negative orbits. Paranasal sinuses are clear. Other: Mastoids remain clear. Negative scalp and face soft tissues. IMPRESSION: 1. Acute lacunar infarct in the left lentiform/external capsule. No acute hemorrhage or associated mass effect. 2. Underlying advanced chronic ischemic disease which is otherwise stable since July. Electronically Signed   By: Odessa Fleming M.D.   On: 07/23/2019 15:05   US Carotid Bilateral (at [facility] Only)  Result Date: 07/24/2019 CLINICAL DATA:  Stroke.  Hypertension, hyperlipidemia, diabetes. EXAM:  BILATERAL CAROTID DUPLEX ULTRASOUND TECHNIQUE: Wallace Cullens scale imaging, color Doppler and duplex ultrasound were performed of bilateral carotid and vertebral arteries in the neck. COMPARISON:  05/19/2015 FINDINGS: Criteria: Quantification of carotid stenosis is based on velocity parameters that correlate the residual internal carotid diameter with NASCET-based stenosis levels, using the diameter of the distal internal carotid lumen as the denominator for stenosis measurement. The following velocity measurements were obtained: RIGHT ICA: 65/26 cm/sec CCA: 87/19 cm/sec SYSTOLIC ICA/CCA RATIO:  0.7 ECA: 174 cm/sec LEFT ICA: 77/27 cm/sec CCA: 78/14 cm/sec SYSTOLIC ICA/CCA RATIO:  1.0 ECA: 104 cm/sec RIGHT CAROTID ARTERY:  Intimal thickening in the common carotid artery. Partially calcified plaque in the bulb and proximal ICA. No high-grade stenosis. Normal waveforms and color Doppler signal. RIGHT VERTEBRAL ARTERY:  Normal flow direction and waveform. LEFT CAROTID ARTERY: Mild tortuosity. Calcified nonocclusive plaque in the common carotid artery. No high-grade stenosis. Normal waveforms and color Doppler signal. LEFT VERTEBRAL ARTERY:  Normal flow direction and waveform. IMPRESSION: 1. Bilateral carotid bifurcation plaque resulting in less than 50% diameter ICA stenosis. 2. Antegrade bilateral vertebral arterial flow. Electronically Signed   By: Lucrezia Europe M.D.   On: 07/24/2019 10:47   Dg Chest Portable 1 View  Result Date: 07/23/2019 CLINICAL DATA:  Weakness. EXAM: PORTABLE CHEST 1 VIEW COMPARISON:  07/20/2019. FINDINGS: Cardiomegaly with pulmonary venous congestion. No focal infiltrate. Interval resolution of basilar atelectasis with improved aeration of both lungs from prior exam. No pleural effusion or pneumothorax. Degenerative change thoracic spine. IMPRESSION: Cardiomegaly with pulmonary venous congestion. No focal infiltrate. Interval resolution of basilar atelectasis with improved aeration from prior exam.  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 - dvt prophylaxis - Will follow up with you 07/25/2019, 10:55 AM

## 2019-07-25 NOTE — Plan of Care (Signed)
  Problem: Education: Goal: Knowledge of General Education information will improve Description: Including pain rating scale, medication(s)/side effects and non-pharmacologic comfort measures Outcome: Progressing   Problem: Education: Goal: Knowledge of disease or condition will improve Outcome: Progressing   Problem: Self-Care: Goal: Ability to communicate needs accurately will improve Outcome: Progressing

## 2019-07-25 NOTE — Discharge Instructions (Signed)
You were cared for by a hospitalist during your hospital stay. If you have any questions about your discharge medications or the care you received while you were in the hospital after you are discharged, you can call the unit and ask to speak with the hospitalist on call if the hospitalist that took care of you is not available. Once you are discharged, your primary care physician will handle any further medical issues. Please note that NO REFILLS for any discharge medications will be authorized once you are discharged, as it is imperative that you return to your primary care physician (or establish a relationship with a primary care physician if you do not have one) for your aftercare needs so that they can reassess your need for medications and monitor your lab values. ° °Follow up with PCP and neurologist. Take all medications as prescribed. If symptoms change or worsen please return to the ED for evaluation  ° °

## 2019-07-25 NOTE — TOC Transition Note (Signed)
Transition of Care [facility]) - CM/SW Discharge Note   Patient Details  Name: [name] MRN: [id number] Date of Birth: 1966/03/14  [facility] CM/SW Contact:  Latanya Maudlin, RN Phone Number: 07/25/2019, 3:52 PM   Clinical Narrative:  Patient to be discharged per MD order. Orders in place for home health services. Previous TOC team member was working to set up home health with [facility] unable to take referral. [facility] was able to take referral. Patient agreeable to this. Patient understands she may have co pay. DME RW provided via [employer] to transport.      Final next level of care: Home w Home Health Services Barriers to Discharge: No Barriers Identified   Patient Goals and CMS Choice   CMS Medicare.gov Compare Post Acute Care list provided to:: Patient Choice offered to / list presented to : Patient  Discharge Placement                       Discharge Plan and Services                          HH Arranged: RN, PT, Nurse's Aide [facility] Agency: [place] ([facility]) Date [place]: 07/25/19 Time [place]: [phone] Representative spoke with at [place]: [place] ([place]) Interventions     Readmission Risk Interventions Readmission Risk Prevention Plan 07/25/2019  Post Dischage Appt Complete  Medication Screening Complete  Transportation Screening Complete  Some recent data might be hidden

## 2019-07-26 ENCOUNTER — Other Ambulatory Visit: Payer: Self-pay | Admitting: Internal Medicine

## 2019-07-26 LAB — URINE CULTURE: Culture: >=100000 — AB

## 2019-07-26 MED ORDER — CIPROFLOXACIN HCL 500 MG PO TABS: 500.0000 mg | ORAL_TABLET | Freq: Two times a day (BID) | ORAL | 0 refills | Status: AC

## 2019-07-29 LAB — VITAMIN B1: Vitamin B1 (Thiamine): 97.8 nmol/L (ref 66.5–200.0)

## 2019-12-20 ENCOUNTER — Emergency Department: Payer: Medicaid Other

## 2019-12-20 ENCOUNTER — Inpatient Hospital Stay
Admission: EM | Admit: 2019-12-20 | Discharge: 2019-12-22 | DRG: 641 | Disposition: A | Discharge status: Home / Self Care | Payer: Medicaid Other | Attending: Family Medicine | Admitting: Family Medicine

## 2019-12-20 ENCOUNTER — Other Ambulatory Visit: Payer: Self-pay

## 2019-12-20 DIAGNOSIS — Z833 Family history of diabetes mellitus: Secondary | ICD-10-CM

## 2019-12-20 DIAGNOSIS — N179 Acute kidney failure, unspecified: Secondary | ICD-10-CM | POA: Diagnosis present

## 2019-12-20 DIAGNOSIS — Z20822 Contact with and (suspected) exposure to covid-19: Secondary | ICD-10-CM | POA: Diagnosis present

## 2019-12-20 DIAGNOSIS — E860 Dehydration: Secondary | ICD-10-CM | POA: Diagnosis not present

## 2019-12-20 DIAGNOSIS — G40909 Epilepsy, unspecified, not intractable, without status epilepticus: Secondary | ICD-10-CM

## 2019-12-20 DIAGNOSIS — R8271 Bacteriuria: Secondary | ICD-10-CM | POA: Diagnosis present

## 2019-12-20 DIAGNOSIS — Z794 Long term (current) use of insulin: Secondary | ICD-10-CM

## 2019-12-20 DIAGNOSIS — R8281 Pyuria: Secondary | ICD-10-CM | POA: Diagnosis present

## 2019-12-20 DIAGNOSIS — J309 Allergic rhinitis, unspecified: Secondary | ICD-10-CM | POA: Diagnosis present

## 2019-12-20 DIAGNOSIS — R32 Unspecified urinary incontinence: Secondary | ICD-10-CM | POA: Diagnosis present

## 2019-12-20 DIAGNOSIS — I959 Hypotension, unspecified: Secondary | ICD-10-CM | POA: Diagnosis present

## 2019-12-20 DIAGNOSIS — R000 Tachycardia, unspecified: Secondary | ICD-10-CM | POA: Diagnosis present

## 2019-12-20 DIAGNOSIS — Z8249 Family history of ischemic heart disease and other diseases of the circulatory system: Secondary | ICD-10-CM | POA: Diagnosis not present

## 2019-12-20 DIAGNOSIS — Z789 Other specified health status: Secondary | ICD-10-CM | POA: Diagnosis not present

## 2019-12-20 DIAGNOSIS — I10 Essential (primary) hypertension: Secondary | ICD-10-CM | POA: Diagnosis not present

## 2019-12-20 DIAGNOSIS — F329 Major depressive disorder, single episode, unspecified: Secondary | ICD-10-CM | POA: Diagnosis present

## 2019-12-20 DIAGNOSIS — Z6841 Body Mass Index (BMI) 40.0 and over, adult: Secondary | ICD-10-CM

## 2019-12-20 DIAGNOSIS — I639 Cerebral infarction, unspecified: Secondary | ICD-10-CM | POA: Diagnosis not present

## 2019-12-20 DIAGNOSIS — W19XXXA Unspecified fall, initial encounter: Secondary | ICD-10-CM | POA: Diagnosis present

## 2019-12-20 DIAGNOSIS — R531 Weakness: Secondary | ICD-10-CM | POA: Diagnosis not present

## 2019-12-20 DIAGNOSIS — D638 Anemia in other chronic diseases classified elsewhere: Secondary | ICD-10-CM | POA: Diagnosis present

## 2019-12-20 DIAGNOSIS — R296 Repeated falls: Secondary | ICD-10-CM | POA: Diagnosis not present

## 2019-12-20 DIAGNOSIS — E1169 Type 2 diabetes mellitus with other specified complication: Secondary | ICD-10-CM | POA: Diagnosis not present

## 2019-12-20 DIAGNOSIS — Z79899 Other long term (current) drug therapy: Secondary | ICD-10-CM | POA: Diagnosis not present

## 2019-12-20 DIAGNOSIS — I69354 Hemiplegia and hemiparesis following cerebral infarction affecting left non-dominant side: Secondary | ICD-10-CM | POA: Diagnosis not present

## 2019-12-20 DIAGNOSIS — Z7982 Long term (current) use of aspirin: Secondary | ICD-10-CM

## 2019-12-20 DIAGNOSIS — I69398 Other sequelae of cerebral infarction: Secondary | ICD-10-CM

## 2019-12-20 DIAGNOSIS — E785 Hyperlipidemia, unspecified: Secondary | ICD-10-CM | POA: Diagnosis not present

## 2019-12-20 LAB — COMPREHENSIVE METABOLIC PANEL
ALT: 15 U/L (ref 0–44)
AST: 20 U/L (ref 15–41)
Albumin: 3.9 g/dL (ref 3.5–5.0)
Alkaline Phosphatase: 37 U/L — ABNORMAL LOW (ref 38–126)
Anion gap: 12 (ref 5–15)
BUN: 32 mg/dL — ABNORMAL HIGH (ref 6–20)
CO2: 26 mmol/L (ref 22–32)
Calcium: 9 mg/dL (ref 8.9–10.3)
Chloride: 103 mmol/L (ref 98–111)
Creatinine, Ser: 1.63 mg/dL — ABNORMAL HIGH (ref 0.44–1.00)
GFR calc Af Amer: 41 mL/min — ABNORMAL LOW (ref >60)
GFR calc non Af Amer: 35 mL/min — ABNORMAL LOW (ref >60)
Glucose, Bld: 191 mg/dL — ABNORMAL HIGH (ref 70–99)
Potassium: 4.1 mmol/L (ref 3.5–5.1)
Sodium: 141 mmol/L (ref 135–145)
Total Bilirubin: 0.6 mg/dL (ref 0.3–1.2)
Total Protein: 7.2 g/dL (ref 6.5–8.1)

## 2019-12-20 LAB — CBC
HCT: 32.8 % — ABNORMAL LOW (ref 36.0–46.0)
Hemoglobin: 9.8 g/dL — ABNORMAL LOW (ref 12.0–15.0)
MCH: 19.4 pg — ABNORMAL LOW (ref 26.0–34.0)
MCHC: 29.9 g/dL — ABNORMAL LOW (ref 30.0–36.0)
MCV: 64.8 fL — ABNORMAL LOW (ref 80.0–100.0)
Platelets: 223 10*3/uL (ref 150–400)
RBC: 5.06 MIL/uL (ref 3.87–5.11)
RDW: 21.4 % — ABNORMAL HIGH (ref 11.5–15.5)
WBC: 7.9 10*3/uL (ref 4.0–10.5)
nRBC: 0 % (ref 0.0–0.2)

## 2019-12-20 MED ORDER — ACETAMINOPHEN 325 MG PO TABS: 650.0000 mg | ORAL_TABLET | ORAL | Status: DC | PRN

## 2019-12-20 MED ORDER — ACETAMINOPHEN 650 MG RE SUPP: 650.0000 mg | Freq: Four times a day (QID) | RECTAL | Status: DC | PRN

## 2019-12-20 MED ORDER — INSULIN ASPART 100 UNIT/ML ~~LOC~~ SOLN: 0.0000 [IU] | Freq: Every day | SUBCUTANEOUS | Status: DC

## 2019-12-20 MED ORDER — SENNOSIDES-DOCUSATE SODIUM 8.6-50 MG PO TABS
1.0000 | ORAL_TABLET | Freq: Every evening | ORAL | Status: DC | PRN
  Administered 2019-12-21: 1 via ORAL
  Filled 2019-12-20: qty 1

## 2019-12-20 MED ORDER — ACETAMINOPHEN 325 MG PO TABS: 650.0000 mg | ORAL_TABLET | Freq: Four times a day (QID) | ORAL | Status: DC | PRN

## 2019-12-20 MED ORDER — INSULIN ASPART 100 UNIT/ML ~~LOC~~ SOLN
0.0000 [IU] | Freq: Three times a day (TID) | SUBCUTANEOUS | Status: DC
  Administered 2019-12-21: 3 [IU] via SUBCUTANEOUS
  Administered 2019-12-21: 2 [IU] via SUBCUTANEOUS
  Administered 2019-12-21: 5 [IU] via SUBCUTANEOUS
  Administered 2019-12-22: 2 [IU] via SUBCUTANEOUS
  Administered 2019-12-22: 4 [IU] via SUBCUTANEOUS
  Filled 2019-12-20 (×4): qty 1

## 2019-12-20 MED ORDER — STROKE: EARLY STAGES OF RECOVERY BOOK: Freq: Once | Status: DC

## 2019-12-20 MED ORDER — ONDANSETRON HCL 4 MG PO TABS: 4.0000 mg | ORAL_TABLET | Freq: Four times a day (QID) | ORAL | Status: DC | PRN

## 2019-12-20 MED ORDER — ONDANSETRON HCL 4 MG/2ML IJ SOLN: 4.0000 mg | Freq: Four times a day (QID) | INTRAMUSCULAR | Status: DC | PRN

## 2019-12-20 MED ORDER — ACETAMINOPHEN 650 MG RE SUPP: 650.0000 mg | RECTAL | Status: DC | PRN

## 2019-12-20 MED ORDER — ENOXAPARIN SODIUM 40 MG/0.4ML ~~LOC~~ SOLN
40.0000 mg | SUBCUTANEOUS | Status: DC
  Administered 2019-12-21: 40 mg via SUBCUTANEOUS
  Filled 2019-12-20: qty 0.4

## 2019-12-20 MED ORDER — SODIUM CHLORIDE 0.9 % IV BOLUS: 1000.0000 mL | Freq: Once | INTRAVENOUS | Status: DC

## 2019-12-20 MED ORDER — SENNOSIDES-DOCUSATE SODIUM 8.6-50 MG PO TABS: 1.0000 | ORAL_TABLET | Freq: Every evening | ORAL | Status: DC | PRN

## 2019-12-20 MED ORDER — SODIUM CHLORIDE 0.9 % IV SOLN: INTRAVENOUS | Status: DC

## 2019-12-20 MED ORDER — ACETAMINOPHEN 160 MG/5ML PO SOLN
650.0000 mg | ORAL | Status: DC | PRN
  Filled 2019-12-20: qty 20.3

## 2019-12-20 NOTE — ED Triage Notes (Signed)
Patient to ED for multiple falls since yesterday.  Patient reports previous stroke with left sided deficits.

## 2019-12-20 NOTE — ED Provider Notes (Addendum)
[facility] Emergency Department Provider Note  ____________________________________________   First MD Initiated Contact with Patient 12/20/19 2221     (approximate)  I have reviewed the triage vital signs and the nursing notes.   HISTORY  Chief Complaint Fall    HPI [name] is a 54 y.o. female with past medical history as below here with generalized weakness.  History provided primarily by the patient's husband.  Per report, the patient has been increasingly weak over the last several days.  She has had decreasing appetite.  She has been so weak that she has essentially been unable to walk over the last 24 hours.  She has fallen multiple times due to this weakness.  She has a history of similar episodes, particularly when she gets a UTI or dehydrated.  She has history of prior strokes and normally has no focal deficits, but often gets weak on her left side when she gets ill.  She denies any specific complaints on my assessment.  She denies any headache.  Denies any abdominal pain.  Denies any dysuria.  She does states she feels generally weak.        Past Medical History:  Diagnosis Date  . Allergic rhinitis   . Chickenpox   . Depression   . Diabetes (HCC)   . Epilepsy (HCC)   . Headache   . HTN (hypertension)   . Hyperlipidemia   . Seizures (HCC)   . Stroke (HCC)   . Urinary incontinence     Patient Active Problem List   Diagnosis Date Noted  . Generalized weakness 12/20/2019  . Hemiparesis affecting left side as late effect of cerebrovascular accident (CVA) (HCC) 12/20/2019  . Type 2 diabetes mellitus with hyperlipidemia (HCC) 12/20/2019  . Seizure disorder as sequela of cerebrovascular accident (HCC) 12/20/2019  . Urinary incontinence 12/20/2019  . Self-care deficit 12/20/2019  . Fall   . Lacunar stroke, acute (HCC) 07/24/2019  . Seizure (HCC) 07/24/2019  . Elevated troponin 07/24/2019  . AKI (acute kidney injury) (HCC) 07/24/2019    . Acute metabolic encephalopathy 07/23/2019  . Cerebral infarction (HCC) 08/14/2016  . Recurrent cerebrovascular accidents (CVAs) (HCC) 08/14/2016  . Delirium due to another medical condition 08/13/2016  . Urinary tract infection 08/13/2016  . Right foot infection 05/11/2016  . Foot abscess, right 05/11/2016  . Anxiety and depression 12/02/2015  . CVA (cerebral infarction) 11/27/2015  . Epilepsy (HCC) 06/17/2015  . 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 Headache 05/19/2015  . Weakness of left side of body 05/19/2015  . History of stroke 05/19/2015  . Severe obesity (BMI >= 40) (HCC) 03/19/2014  . HTN (hypertension)   . Diabetes mellitus without complication (HCC)   . Hyperlipidemia   . [name] (transient ischemic attack) 03/17/2014    Past Surgical History:  Procedure Laterality Date  . CESAREAN SECTION    . INCISION AND DRAINAGE Right 05/13/2016   Procedure: INCISION AND DRAINAGE;  Surgeon: Gwyneth Revels, DPM;  Location: [facility] ORS;  Service: Podiatry;  Laterality: Right;    Prior to Admission medications   Medication Sig Start Date End Date Taking? Authorizing Provider  aspirin EC 325 MG EC tablet Take 1 tablet (325 mg total) by mouth daily. 07/26/19  Yes Lorretta Harp, MD  atorvastatin (LIPITOR) 40 MG tablet Take 1 tablet (40 mg total) by mouth daily at 6 PM. 08/16/16  Yes Ghimire, Werner Lean, MD  glipiZIDE (GLUCOTROL XL) 5 MG 24 hr tablet Take 1 tablet (5 mg total) by mouth  daily. 08/16/16  Yes Ghimire, Werner Lean, MD  insulin aspart (NOVOLOG FLEXPEN) 100 UNIT/ML FlexPen Inject 10 Units into the skin 3 (three) times daily with meals. Patient taking differently: Inject 15 Units into the skin 3 (three) times daily with meals.  08/16/16  Yes Ghimire, Werner Lean, MD  Insulin Detemir (LEVEMIR FLEXPEN) 100 UNIT/ML Pen Inject 40 Units into the skin daily at 10 pm. Patient taking differently: Inject 45 Units into the skin daily at 10 pm.  08/16/16  Yes Ghimire, Werner Lean, MD   lisinopril-hydrochlorothiazide (PRINZIDE,ZESTORETIC) 20-12.5 MG tablet Take 1 tablet by mouth daily. 08/16/16  Yes Ghimire, Werner Lean, MD  metFORMIN (GLUCOPHAGE-XR) 500 MG 24 hr tablet Take 500 mg by mouth 2 (two) times daily. 08/23/19  Yes [provider]  divalproex (DEPAKOTE) 250 MG DR tablet Take 250-500 mg by mouth 2 (two) times daily. Take one tablet (250 mg) by mouth every morning and two tablets (500 mg) at bedtime 07/13/19   [provider]  gabapentin (NEURONTIN) 300 MG capsule Take 300-900 mg by mouth 2 (two) times daily. Take one capsule (300 mg) by mouth every morning and two capsules (600 mg) at bedtime. (May take an additional 300 mg at bedtime as needed) 07/13/19   [provider]  metoprolol tartrate (LOPRESSOR) 25 MG tablet Take 25 mg by mouth 2 (two) times a day.    [provider]  VESICARE 10 MG tablet Take 10 mg by mouth 2 (two) times daily. 05/26/19   [provider]    Allergies Patient has no known allergies.  Family History  Problem Relation Age of Onset  . Hypertension Mother   . Diabetes Mellitus II Mother   . Hypertension Father   . Pancreatic cancer Father     Social History Social History   Tobacco Use  . Smoking status: Never Smoker  . Smokeless tobacco: Never Used  Substance Use Topics  . Alcohol use: No    Alcohol/week: 0.0 standard drinks  . Drug use: No    Review of Systems  Review of Systems  Constitutional: Positive for fatigue. Negative for fever.  HENT: Negative for congestion and sore throat.   Eyes: Negative for visual disturbance.  Respiratory: Negative for cough and shortness of breath.   Cardiovascular: Negative for chest pain.  Gastrointestinal: Negative for abdominal pain, diarrhea, nausea and vomiting.  Genitourinary: Negative for flank pain.  Musculoskeletal: Positive for gait problem. Negative for back pain and neck pain.  Skin: Negative for rash and wound.  Neurological: Positive  for weakness.  All other systems reviewed and are negative.    ____________________________________________  PHYSICAL EXAM:      VITAL SIGNS: ED Triage Vitals  Enc Vitals Group     BP 12/20/19 2017 93/66     Pulse Rate 12/20/19 2017 (!) 101     Resp 12/20/19 2017 18     Temp 12/20/19 2017 99 F (37.2 C)     Temp Source 12/20/19 2017 Oral     SpO2 12/20/19 2017 95 %     Weight 12/20/19 2018 160 lb (72.6 kg)     Height 12/20/19 2018 5\' 2"  (1.575 m)     Head Circumference --      Peak Flow --      Pain Score 12/20/19 2017 0     Pain Loc --      Pain Edu? --      Excl. in GC? --      Physical Exam Vitals  and nursing note reviewed.  Constitutional:      General: She is not in acute distress.    Appearance: She is well-developed.  HENT:     Head: Normocephalic and atraumatic.     Mouth/Throat:     Mouth: Mucous membranes are dry.  Eyes:     Conjunctiva/sclera: Conjunctivae normal.  Cardiovascular:     Rate and Rhythm: Normal rate and regular rhythm.     Heart sounds: Normal heart sounds.  Pulmonary:     Effort: Pulmonary effort is normal. No respiratory distress.     Breath sounds: No wheezing.  Abdominal:     General: There is no distension.  Musculoskeletal:     Cervical back: Neck supple.  Skin:    General: Skin is warm.     Capillary Refill: Capillary refill takes less than 2 seconds.     Findings: No rash.  Neurological:     Mental Status: She is alert and oriented to person, place, and time.     Motor: No abnormal muscle tone.     Comments: Slight subjective weakness left upper and lower extremities.  Cranial nerves intact.  Oriented to person and place as well as time.       ____________________________________________   LABS (all labs ordered are listed, but only abnormal results are displayed)  Labs Reviewed  COMPREHENSIVE METABOLIC PANEL - Abnormal; Notable for the following components:      Result Value   Glucose, Bld 191 (*)    BUN 32 (*)      Creatinine, Ser 1.63 (*)    Alkaline Phosphatase 37 (*)    GFR calc non Af Amer 35 (*)    GFR calc Af Amer 41 (*)    All other components within normal limits  CBC - Abnormal; Notable for the following components:   Hemoglobin 9.8 (*)    HCT 32.8 (*)    MCV 64.8 (*)    MCH 19.4 (*)    MCHC 29.9 (*)    RDW 21.4 (*)    All other components within normal limits  GLUCOSE, CAPILLARY - Abnormal; Notable for the following components:   Glucose-Capillary 159 (*)    All other components within normal limits  SARS CORONAVIRUS 2 (TAT 6-24 HRS)  URINALYSIS, COMPLETE (UACMP) WITH MICROSCOPIC  VALPROIC ACID LEVEL  HEMOGLOBIN A1C  LIPID PANEL  HEMOGLOBIN A1C  POC URINE PREG, ED    ____________________________________________  ZOX:WRUEAVEKG:Normal sinus rhythm, ventricular 100.  PR 150, QRS 84, QTc 472.  Nonspecific T wave abnormalities.  No acute ST elevations. ________________________________________  RADIOLOGY All imaging, including plain films, CT scans, and ultrasounds, independently reviewed by me, and interpretations confirmed via formal radiology reads.  ED MD interpretation:   CT Head: NAICA CXR: No active disease  Official radiology report(s): CT Head Wo Contrast  Result Date: 12/20/2019 CLINICAL DATA:  Multiple falls, previous stroke EXAM: CT HEAD WITHOUT CONTRAST TECHNIQUE: Contiguous axial images were obtained from the base of the skull through the vertex without intravenous contrast. COMPARISON:  07/23/2019 FINDINGS: Brain: There is no acute intracranial hemorrhage, mass effect, or edema. There is no new loss of gray-white differentiation. Large chronic right MCA territory infarction again identified with encephalomalacia within the frontoparietal and temporal lobes and posterior insula. Additional patchy and confluent hypoattenuation in the supratentorial white matter is nonspecific but likely reflects stable chronic microvascular ischemic changes. There are chronic small vessel  infarcts of the pons. Wallerian degeneration along the right cerebral peduncle. There is no  extra-axial fluid collection. Ex vacuo dilatation of right lateral ventricle. Superimposed prominence of the ventricles and sulci reflects stable generalized parenchymal volume loss. Vascular: There is atherosclerotic calcification at the skull base. Skull: Calvarium is unremarkable. Sinuses/Orbits: Minor mucosal thickening.  Orbits are unremarkable. Other: None. IMPRESSION: No acute intracranial abnormality. Stable chronic findings detailed above. Electronically Signed   By: Guadlupe Spanish M.D.   On: 12/20/2019 21:02   DG Chest Portable 1 View  Result Date: 12/20/2019 CLINICAL DATA:  Weakness EXAM: PORTABLE CHEST 1 VIEW COMPARISON:  07/23/2019 FINDINGS: The heart size and mediastinal contours are within normal limits. Both lungs are clear. The visualized skeletal structures are unremarkable. IMPRESSION: No active disease. Electronically Signed   By: Deatra Robinson M.D.   On: 12/20/2019 22:55    ____________________________________________  PROCEDURES   Procedure(s) performed (including Critical Care):  .1-3 Lead EKG Interpretation Performed by: Shaune Pollack, MD Authorized by: Shaune Pollack, MD     Interpretation: normal     ECG rate assessment: normal     Rhythm: sinus rhythm     Ectopy: none     Conduction: normal   Comments:     Indication: Weakness, generalized, with cardiac history    ____________________________________________  INITIAL IMPRESSION / MDM / ASSESSMENT AND PLAN / ED COURSE  As part of my medical decision making, I reviewed the following data within the electronic MEDICAL RECORD NUMBER Nursing notes reviewed and incorporated, Old chart reviewed, Notes from prior ED visits, and  Controlled Substance Database       *Aliany Fiorenza was evaluated in Emergency Department on 12/21/2019 for the symptoms described in the history of present illness. She was evaluated in the context of  the global COVID-19 pandemic, which necessitated consideration that the patient might be at risk for infection with the SARS-CoV-2 virus that causes COVID-19. Institutional protocols and algorithms that pertain to the evaluation of patients at risk for COVID-19 are in a state of rapid change based on information released by regulatory bodies including the CDC and federal and state organizations. These policies and algorithms were followed during the patient's care in the ED.  Some ED evaluations and interventions may be delayed as a result of limited staffing during the pandemic.*     Medical Decision Making:  54 yo F here with generalized weakness and difficulty ambulating. Labs show mild likely prerenal AKI, which I suspect is causing some of her weakness and difficulty ambulating. No new neuro deficits on exam and CT head neg, so I suspect her subjective weakness is recrudescence of her prior stroke deficits from her dehydration. Consider possible UTI as well. Will check UA, admit given her inability to ambulate w/ recurrent falls and dehydration.  ____________________________________________  FINAL CLINICAL IMPRESSION(S) / ED DIAGNOSES  Final diagnoses:  Generalized weakness  Dehydration     MEDICATIONS GIVEN DURING THIS VISIT:  Medications  sodium chloride 0.9 % bolus 1,000 mL (has no administration in time range)   stroke: mapping our early stages of recovery book (has no administration in time range)  0.9 %  sodium chloride infusion (has no administration in time range)  acetaminophen (TYLENOL) tablet 650 mg (has no administration in time range)    Or  acetaminophen (TYLENOL) 160 MG/5ML solution 650 mg (has no administration in time range)    Or  acetaminophen (TYLENOL) suppository 650 mg (has no administration in time range)  senna-docusate (Senokot-S) tablet 1 tablet (has no administration in time range)  senna-docusate (Senokot-S) tablet 1 tablet (has  no administration in time  range)  ondansetron (ZOFRAN) tablet 4 mg (has no administration in time range)    Or  ondansetron (ZOFRAN) injection 4 mg (has no administration in time range)  enoxaparin (LOVENOX) injection 40 mg (has no administration in time range)  insulin aspart (novoLOG) injection 0-15 Units (has no administration in time range)  insulin aspart (novoLOG) injection 0-5 Units (0 Units Subcutaneous Not Given 12/21/19 0031)     ED Discharge Orders    None       Note:  This document was prepared using Dragon voice recognition software and may include unintentional dictation errors.   Duffy Bruce, MD 12/21/19 Raye Sorrow    Duffy Bruce, MD 12/21/19 [phone]

## 2019-12-20 NOTE — ED Notes (Signed)
This RN and Museum/gallery curator at bedside. Unable to obtain urine via in and out catheter. Purewick placed on pt. IV team consult made at this time, pt difficult stick.

## 2019-12-20 NOTE — H&P (Signed)
History and Physical    [name] MEQ:[id number] DOB: [birth year] DOA: 12/20/2019  PCP: [facility]   Patient coming from: home  I have personally briefly reviewed patient's old medical records in [facility]  Chief Complaint: weakness, fall  HPI: [name] is a 54 y.o. female with medical history significant for recurrent CVAs with residual left hemiparesis and seizure disorder, diabetes, hypertension, urinary incontinence who was brought into the emergency room with a several day history of generalized weakness, causing falls, worsening in the past day to where patient will not get out of bed.  Patient was last hospitalized in November 2020 with an acute lacunar infarct presenting with a fall and she was discharged on rolling walker at that time.  Most of the history is given by the husband.  She has had no recent seizures.  She follows with neurology in the outpatient.  In addition to generalized weakness, she has decreased oral intake.  Denies nausea, vomiting or diarrhea.  Denies abdominal pain.  She has urinary incontinence at baseline.  No fevers or chills.  No cough chest pain or shortness of breath ED Course: On arrival in the emergency room she had a temperature of 99, blood pressure 93/66 improving to 126/78 with hydration, mildly tachycardic at 101, O2 sat 95% on room air.  Blood work significant for creatinine of 1.63 above baseline of 0.93.  Hemoglobin 9.8 which is baseline.  Head CT showed no acute findings, EKG normal sinus rhythm with no acute ST-T wave changes.  Chest x-ray no acute disease.  Urinalysis pending at time of decision to admit.  Hospitalist consulted for admission.  Review of Systems: As per HPI otherwise 10 point review of systems negative.    Past Medical History:  Diagnosis Date  . Allergic rhinitis   . Chickenpox   . Depression   . Diabetes (HCC)   . Epilepsy (HCC)   . Headache   . HTN (hypertension)   . Hyperlipidemia   .  Seizures (HCC)   . Stroke (HCC)   . Urinary incontinence     Past Surgical History:  Procedure Laterality Date  . CESAREAN SECTION    . INCISION AND DRAINAGE Right 05/13/2016   Procedure: INCISION AND DRAINAGE;  Surgeon: Gwyneth Revels, DPM;  Location: [facility] ORS;  Service: Podiatry;  Laterality: Right;     reports that she has never smoked. She has never used smokeless tobacco. She reports that she does not drink alcohol or use drugs.  No Known Allergies  Family History  Problem Relation Age of Onset  . Hypertension Mother   . Diabetes Mellitus II Mother   . Hypertension Father   . Pancreatic cancer Father      Prior to Admission medications   Medication Sig Start Date End Date Taking? Authorizing Provider  aspirin EC 325 MG EC tablet Take 1 tablet (325 mg total) by mouth daily. 07/26/19   Lorretta Harp, MD  atorvastatin (LIPITOR) 40 MG tablet Take 1 tablet (40 mg total) by mouth daily at 6 PM. 08/16/16   Ghimire, Werner Lean, MD  divalproex (DEPAKOTE) 250 MG DR tablet Take 250-500 mg by mouth 2 (two) times daily. Take one tablet (250 mg) by mouth every morning and two tablets (500 mg) at bedtime 07/13/19   [provider]  gabapentin (NEURONTIN) 300 MG capsule Take 300-900 mg by mouth 2 (two) times daily. Take one capsule (300 mg) by mouth every morning and two capsules (600 mg) at  bedtime. (May take an additional 300 mg at bedtime as needed) 07/13/19   [provider]  glipiZIDE (GLUCOTROL XL) 5 MG 24 hr tablet Take 1 tablet (5 mg total) by mouth daily. Patient not taking: Reported on 07/23/2019 08/16/16   Maretta Bees, MD  insulin aspart (NOVOLOG FLEXPEN) 100 UNIT/ML FlexPen Inject 10 Units into the skin 3 (three) times daily with meals. Patient taking differently: Inject 15 Units into the skin 3 (three) times daily with meals.  08/16/16   Ghimire, Werner Lean, MD  Insulin Detemir (LEVEMIR FLEXPEN) 100 UNIT/ML Pen Inject 40 Units into the skin daily at 10  pm. Patient taking differently: Inject 55 Units into the skin daily at 10 pm.  08/16/16   Ghimire, Werner Lean, MD  lisinopril-hydrochlorothiazide (PRINZIDE,ZESTORETIC) 20-12.5 MG tablet Take 1 tablet by mouth daily. 08/16/16   Ghimire, Werner Lean, MD  metFORMIN (GLUCOPHAGE) 500 MG tablet Take 1 tablet (500 mg total) by mouth 2 (two) times daily with a meal. 08/16/16   Ghimire, Werner Lean, MD  metoprolol tartrate (LOPRESSOR) 25 MG tablet Take 25 mg by mouth 2 (two) times a day.    [provider]  VESICARE 10 MG tablet Take 10 mg by mouth 2 (two) times daily. 05/26/19   [provider]    Physical Exam: Vitals:   12/20/19 2214 12/20/19 2215 12/20/19 2230 12/20/19 2300  BP: 128/78  127/68 133/83  Pulse:  94  84  Resp:    18  Temp:      TempSrc:      SpO2:  96%  96%  Weight:      Height:         Vitals:   12/20/19 2214 12/20/19 2215 12/20/19 2230 12/20/19 2300  BP: 128/78  127/68 133/83  Pulse:  94  84  Resp:    18  Temp:      TempSrc:      SpO2:  96%  96%  Weight:      Height:        Constitutional: Alert and awake, oriented x3, not in any acute distress. Eyes: PERLA, EOMI, irises appear normal, anicteric sclera,  ENMT: external ears and nose appear normal, normal hearing             Lips appears normal, oropharynx mucosa, tongue, posterior pharynx appear normal  Neck: neck appears normal, no masses, normal ROM, no thyromegaly, no JVD  CVS: S1-S2 clear, no murmur rubs or gallops,  , no carotid bruits, pedal pulses palpable, No LE edema Respiratory:  clear to auscultation bilaterally, no wheezing, rales or rhonchi. Respiratory effort normal. No accessory muscle use.  Abdomen: soft nontender, nondistended, normal bowel sounds, no hepatosplenomegaly, no hernias Musculoskeletal: : no cyanosis, clubbing , no contractures or atrophy Neuro: Cranial nerves II-XII intact, minimal weakness on left Psych: judgement and insight appear normal, stable mood and affect,  Skin:  no rashes or lesions or ulcers, no induration or nodules   Labs on Admission: I have personally reviewed following labs and imaging studies  CBC: Recent Labs  Lab 12/20/19 2027  WBC 7.9  HGB 9.8*  HCT 32.8*  MCV 64.8*  PLT 223   Basic Metabolic Panel: Recent Labs  Lab 12/20/19 2027  NA 141  K 4.1  CL 103  CO2 26  GLUCOSE 191*  BUN 32*  CREATININE 1.63*  CALCIUM 9.0   GFR: Estimated Creatinine Clearance: 36.8 mL/min (A) (by C-G formula based on SCr of 1.63 mg/dL (H)). Liver Function  Tests: Recent Labs  Lab 12/20/19 2027  AST 20  ALT 15  ALKPHOS 37*  BILITOT 0.6  PROT 7.2  ALBUMIN 3.9   No results for input(s): LIPASE, AMYLASE in the last 168 hours. No results for input(s): AMMONIA in the last 168 hours. Coagulation Profile: No results for input(s): INR, PROTIME in the last 168 hours. Cardiac Enzymes: No results for input(s): CKTOTAL, CKMB, CKMBINDEX, TROPONINI in the last 168 hours. BNP (last 3 results) No results for input(s): PROBNP in the last 8760 hours. HbA1C: No results for input(s): HGBA1C in the last 72 hours. CBG: No results for input(s): GLUCAP in the last 168 hours. Lipid Profile: No results for input(s): CHOL, HDL, LDLCALC, TRIG, CHOLHDL, LDLDIRECT in the last 72 hours. Thyroid Function Tests: No results for input(s): TSH, T4TOTAL, FREET4, T3FREE, THYROIDAB in the last 72 hours. Anemia Panel: No results for input(s): VITAMINB12, FOLATE, FERRITIN, TIBC, IRON, RETICCTPCT in the last 72 hours. Urine analysis:    Component Value Date/Time   COLORURINE YELLOW (A) 07/23/2019 1208   APPEARANCEUR CLOUDY (A) 07/23/2019 1208   APPEARANCEUR Clear 12/27/2014 1806   LABSPEC 1.018 07/23/2019 1208   LABSPEC 1.035 12/27/2014 1806   PHURINE 6.0 07/23/2019 1208   GLUCOSEU NEGATIVE 07/23/2019 1208   GLUCOSEU >=500 12/27/2014 1806   HGBUR NEGATIVE 07/23/2019 1208   BILIRUBINUR NEGATIVE 07/23/2019 1208   BILIRUBINUR Negative 12/27/2014 1806   KETONESUR  NEGATIVE 07/23/2019 1208   PROTEINUR NEGATIVE 07/23/2019 1208   NITRITE POSITIVE (A) 07/23/2019 1208   LEUKOCYTESUR MODERATE (A) 07/23/2019 1208   LEUKOCYTESUR Negative 12/27/2014 1806    Radiological Exams on Admission: CT Head Wo Contrast  Result Date: 12/20/2019 CLINICAL DATA:  Multiple falls, previous stroke EXAM: CT HEAD WITHOUT CONTRAST TECHNIQUE: Contiguous axial images were obtained from the base of the skull through the vertex without intravenous contrast. COMPARISON:  07/23/2019 FINDINGS: Brain: There is no acute intracranial hemorrhage, mass effect, or edema. There is no new loss of gray-white differentiation. Large chronic right MCA territory infarction again identified with encephalomalacia within the frontoparietal and temporal lobes and posterior insula. Additional patchy and confluent hypoattenuation in the supratentorial white matter is nonspecific but likely reflects stable chronic microvascular ischemic changes. There are chronic small vessel infarcts of the pons. Wallerian degeneration along the right cerebral peduncle. There is no extra-axial fluid collection. Ex vacuo dilatation of right lateral ventricle. Superimposed prominence of the ventricles and sulci reflects stable generalized parenchymal volume loss. Vascular: There is atherosclerotic calcification at the skull base. Skull: Calvarium is unremarkable. Sinuses/Orbits: Minor mucosal thickening.  Orbits are unremarkable. Other: None. IMPRESSION: No acute intracranial abnormality. Stable chronic findings detailed above. Electronically Signed   By: Macy Mis M.D.   On: 12/20/2019 21:02   DG Chest Portable 1 View  Result Date: 12/20/2019 CLINICAL DATA:  Weakness EXAM: PORTABLE CHEST 1 VIEW COMPARISON:  07/23/2019 FINDINGS: The heart size and mediastinal contours are within normal limits. Both lungs are clear. The visualized skeletal structures are unremarkable. IMPRESSION: No active disease. Electronically Signed   By: Ulyses Jarred M.D.   On: 12/20/2019 22:55    EKG: Independently reviewed.   Assessment/Plan    Fall Possible CVA/generalized weakness -Patient has no acute focal weakness but she presented similarly in November when and was diagnosed with an acute lacunar infarct -Differential includes CVA, UTI, dehydration -Echocardiogram.  Will not repeat Doppler which she had in November.  Cardiac monitoring -MRI -Continue aspirin and statin, if ruled in, can add 30-day overlap of  Plavix -Permissive hypertension -Neurology consult  Recurrent cerebrovascular accidents (CVAs) (HCC)   Hemiparesis affecting left side as late effect of cerebrovascular accident (CVA) (HCC) -Stroke work-up as outlined above    Seizure disorder as sequela of cerebrovascular accident (HCC) -Continue Depakote -Patient follows with neurology as outpatient.  Last EEG was within normal limits    Self-care deficit -Patient's husband provides assistance with ADLs    AKI (acute kidney injury) (HCC) -Secondary to dehydration from poor oral intake. -IV hydration -Being evaluated for UTI    HTN (hypertension) -Hold home antihypertensives as patient was mildly hypotensive on admission, 93/66, improving with IV fluids    Type 2 diabetes mellitus with hyperlipidemia (HCC) -Sliding scale insulin coverage    Urinary incontinence -Continue Vesicare -Follow urinalysis    DVT prophylaxis: Lovenox  Code Status: full code  Family Communication:  none  Disposition Plan: Back to previous home environment Consults called: Neurology, Dr. Thad Ranger Status:inp    Andris Baumann MD [facility]     12/20/2019, 11:52 PM

## 2019-12-21 ENCOUNTER — Inpatient Hospital Stay: Payer: Medicaid Other

## 2019-12-21 DIAGNOSIS — R531 Weakness: Secondary | ICD-10-CM

## 2019-12-21 DIAGNOSIS — R296 Repeated falls: Secondary | ICD-10-CM

## 2019-12-21 DIAGNOSIS — I69398 Other sequelae of cerebral infarction: Secondary | ICD-10-CM

## 2019-12-21 DIAGNOSIS — R32 Unspecified urinary incontinence: Secondary | ICD-10-CM

## 2019-12-21 DIAGNOSIS — E1169 Type 2 diabetes mellitus with other specified complication: Secondary | ICD-10-CM

## 2019-12-21 DIAGNOSIS — N179 Acute kidney failure, unspecified: Secondary | ICD-10-CM

## 2019-12-21 DIAGNOSIS — W19XXXA Unspecified fall, initial encounter: Secondary | ICD-10-CM

## 2019-12-21 DIAGNOSIS — E785 Hyperlipidemia, unspecified: Secondary | ICD-10-CM

## 2019-12-21 DIAGNOSIS — I10 Essential (primary) hypertension: Secondary | ICD-10-CM

## 2019-12-21 DIAGNOSIS — Z789 Other specified health status: Secondary | ICD-10-CM

## 2019-12-21 DIAGNOSIS — I69354 Hemiplegia and hemiparesis following cerebral infarction affecting left non-dominant side: Secondary | ICD-10-CM

## 2019-12-21 DIAGNOSIS — G40909 Epilepsy, unspecified, not intractable, without status epilepticus: Secondary | ICD-10-CM

## 2019-12-21 LAB — URINALYSIS, COMPLETE (UACMP) WITH MICROSCOPIC
Bilirubin Urine: NEGATIVE
Glucose, UA: NEGATIVE mg/dL
Hgb urine dipstick: NEGATIVE
Ketones, ur: 5 mg/dL — AB
Nitrite: NEGATIVE
Protein, ur: 30 mg/dL — AB
Specific Gravity, Urine: 1.018 (ref 1.005–1.030)
Squamous Epithelial / HPF: NONE SEEN (ref 0–5)
pH: 6 (ref 5.0–8.0)

## 2019-12-21 LAB — HEMOGLOBIN A1C
Hgb A1c MFr Bld: 7.9 % — ABNORMAL HIGH (ref 4.8–5.6)
Mean Plasma Glucose: 180.03 mg/dL

## 2019-12-21 LAB — LIPID PANEL
Cholesterol: 127 mg/dL (ref 0–200)
HDL: 42 mg/dL (ref >40)
LDL Cholesterol: 64 mg/dL (ref 0–99)
Total CHOL/HDL Ratio: 3 RATIO
Triglycerides: 105 mg/dL (ref <150)
VLDL: 21 mg/dL (ref 0–40)

## 2019-12-21 LAB — GLUCOSE, CAPILLARY
Glucose-Capillary: 140 mg/dL — ABNORMAL HIGH (ref 70–99)
Glucose-Capillary: 153 mg/dL — ABNORMAL HIGH (ref 70–99)
Glucose-Capillary: 159 mg/dL — ABNORMAL HIGH (ref 70–99)
Glucose-Capillary: 164 mg/dL — ABNORMAL HIGH (ref 70–99)
Glucose-Capillary: 220 mg/dL — ABNORMAL HIGH (ref 70–99)

## 2019-12-21 LAB — VALPROIC ACID LEVEL: Valproic Acid Lvl: 48 ug/mL — ABNORMAL LOW (ref 50.0–100.0)

## 2019-12-21 LAB — SARS CORONAVIRUS 2 (TAT 6-24 HRS): SARS Coronavirus 2: NEGATIVE

## 2019-12-21 MED ORDER — GABAPENTIN 300 MG PO CAPS
300.0000 mg | ORAL_CAPSULE | Freq: Every morning | ORAL | Status: DC
  Administered 2019-12-22: 300 mg via ORAL
  Filled 2019-12-21: qty 1

## 2019-12-21 MED ORDER — GABAPENTIN 300 MG PO CAPS: 300.0000 mg | ORAL_CAPSULE | Freq: Every evening | ORAL | Status: DC | PRN

## 2019-12-21 MED ORDER — ASPIRIN EC 325 MG PO TBEC
325.0000 mg | DELAYED_RELEASE_TABLET | Freq: Every day | ORAL | Status: DC
  Administered 2019-12-21 – 2019-12-22 (×2): 325 mg via ORAL
  Filled 2019-12-21 (×2): qty 1

## 2019-12-21 MED ORDER — DIVALPROEX SODIUM 500 MG PO DR TAB
500.0000 mg | DELAYED_RELEASE_TABLET | Freq: Every day | ORAL | Status: DC
  Administered 2019-12-21: 500 mg via ORAL
  Filled 2019-12-21 (×2): qty 1

## 2019-12-21 MED ORDER — SODIUM CHLORIDE 0.9 % IV SOLN
1.0000 g | INTRAVENOUS | Status: DC
  Administered 2019-12-22: 1 g via INTRAVENOUS
  Filled 2019-12-21: qty 1

## 2019-12-21 MED ORDER — METOPROLOL TARTRATE 25 MG PO TABS
25.0000 mg | ORAL_TABLET | Freq: Two times a day (BID) | ORAL | Status: DC
  Administered 2019-12-21 – 2019-12-22 (×2): 25 mg via ORAL
  Filled 2019-12-21 (×2): qty 1

## 2019-12-21 MED ORDER — DIVALPROEX SODIUM 250 MG PO DR TAB
250.0000 mg | DELAYED_RELEASE_TABLET | Freq: Every day | ORAL | Status: DC
  Administered 2019-12-22: 250 mg via ORAL
  Filled 2019-12-21 (×2): qty 1

## 2019-12-21 MED ORDER — GABAPENTIN 300 MG PO CAPS
600.0000 mg | ORAL_CAPSULE | Freq: Every day | ORAL | Status: DC
  Administered 2019-12-21: 600 mg via ORAL
  Filled 2019-12-21: qty 2

## 2019-12-21 MED ORDER — ATORVASTATIN CALCIUM 20 MG PO TABS
40.0000 mg | ORAL_TABLET | Freq: Every day | ORAL | Status: DC
  Administered 2019-12-21: 40 mg via ORAL
  Filled 2019-12-21: qty 2

## 2019-12-21 MED ORDER — ENOXAPARIN SODIUM 40 MG/0.4ML ~~LOC~~ SOLN
40.0000 mg | Freq: Two times a day (BID) | SUBCUTANEOUS | Status: DC
  Administered 2019-12-21 – 2019-12-22 (×2): 40 mg via SUBCUTANEOUS
  Filled 2019-12-21 (×2): qty 0.4

## 2019-12-21 NOTE — Evaluation (Signed)
Occupational Therapy Evaluation Patient Details Name: [name] MRN: [id number] DOB: 1966/04/05 Today's Date: 12/21/2019    History of Present Illness [name] is a 54 y.o. female with medical history significant for recurrent CVAs with residual left hemiparesis and seizure disorder, diabetes, hypertension, urinary incontinence who was brought into the emergency room with a several day history of generalized weakness, causing falls, worsening in the past day to where patient will not get out of bed.  MRI reveals acute R MCA.   Clinical Impression   Upon entry, patient supine in bed with husband at bedside.  Agreeable to therapy evaluation.  Patient with no reports of pain and vitals at 125/76, 79HR.  Most PLOF gathered from husband.  Patient demonstrating some confusion and difficulty with recall.  Previously, patient required assistance for all self-care and functional transfers using 2WW.  Husband verbalizes fearfulness as he states patient has become increasingly unmotivated to assist with any care.  Husband states he is uncertain if he can continue caring for her at home appropriately but wishes her to return home.  Patient able to sit up in bed with SPV but requires more extensive assist for mobility of L LE.  Unable to assess functional transfers/balance this date as RN entered for IV placement.  Provided extensive education to husband regarding OT role/goals and questioning/encouragement techniques to improve motivation from patient.  Patient would benefit from continued occupational therapy to address the deficits in performance components outlined below.  Based on today's performance, recommending SNF.  Will continue to monitor progress and update as appropriate.      Follow Up Recommendations  SNF;Supervision/Assistance - 24 hour(Husband would like to take patient home but is uncertain if he could provide adequate care.)    Equipment Recommendations  Other (comment)(defer to next level of  care)    Recommendations for Other Services       Precautions / Restrictions Precautions Precautions: Fall;Other (comment) Precaution Comments: aspiration and seizure Restrictions Weight Bearing Restrictions: No      Mobility Bed Mobility Overal bed mobility: Needs Assistance Bed Mobility: Sidelying to Sit   Sidelying to sit: Supervision;HOB elevated       General bed mobility comments: Patient demonstrated good core control and was able to sit upright in bed with SPV.  HOwever, required MOD-MAX A to mobilize L LE to EOB  Transfers Overall transfer level: Needs assistance               General transfer comment: Unable to assess transfers this date.    Balance                                           ADL either performed or assessed with clinical judgement   ADL Overall ADL's : Needs assistance/impaired                     Lower Body Dressing: Maximal assistance;Bed level     Toilet Transfer Details (indicate cue type and reason): Unable to assess Toileting- Clothing Manipulation and Hygiene: Total assistance Toileting - Clothing Manipulation Details (indicate cue type and reason): Patient is incontinent     Functional mobility during ADLs: (Unable to assess) General ADL Comments: Husband reports pt can complete simple tasks when she is motivated, but often he has to provide maximal help for all self care.     Vision Patient Visual Report:  No change from baseline Additional Comments: No reports of changes.     Perception     Praxis      Pertinent Vitals/Pain Pain Assessment: No/denies pain     Hand Dominance Right   Extremity/Trunk Assessment Upper Extremity Assessment Upper Extremity Assessment: Generalized weakness   Lower Extremity Assessment Lower Extremity Assessment: Defer to PT evaluation(Reports L leg is weaker)   Cervical / Trunk Assessment Cervical / Trunk Assessment: Normal   Communication  Communication Communication: (Requires extra time for processing but is alert and oriented.)   Cognition Arousal/Alertness: Awake/alert Behavior During Therapy: WFL for tasks assessed/performed;Flat affect Overall Cognitive Status: Impaired/Different from baseline Area of Impairment: Memory;Safety/judgement                     Memory: Decreased short-term memory   Safety/Judgement: Decreased awareness of deficits;Decreased awareness of safety     General Comments: Per husband, he states she is "a little off" and is often answering PLOF questions incorrectly.   General Comments  Demonstrates some confusion and poor memory.  Patient demonstrates limited motivation to participate in therapy but wants to return home.    Exercises Other Exercises Other Exercises: Provided education on goals and role of OT in acute setting Other Exercises: Provided education on general safety (i.e. use of call light, bed controls, etc) Other Exercises: Performed bed mobility with SPV-MOD A.  Unable to full assess mobility nurse needed to place IV.   Shoulder Instructions      Home Living Family/patient expects to be discharged to:: Private residence Living Arrangements: Spouse/significant other Available Help at Discharge: Family;Available PRN/intermittently(Husband reports he works at night and the patient is alone for hours at a time.) Type of Home: House Home Access: Stairs to enter [employer] of Steps: 3 in front and in back.  Prefers back entrace as hand rails are in place. Entrance Stairs-Rails: (Uncertain which side or if both.) Home Layout: One level     Bathroom Shower/Tub: Tub/shower unit;Curtain         Home Equipment: Cane - single point;Walker - 2 wheels;Grab bars - tub/shower;Shower seat          Prior Functioning/Environment Level of Independence: Needs assistance  Gait / Transfers Assistance Needed: Per husband, patient will transfer and perform short  mobility distances within the home with his assistance. ADL's / Homemaking Assistance Needed: Husband performs all IADLs and homemaking.  Per husband, he assists with all self-care.  Reports pain may be able to dress herself on some occasions.  Patient is incontinent. Communication / Swallowing Assistance Needed: currently with aspiration precautions          OT Problem List: Decreased strength;Decreased range of motion;Decreased activity tolerance;Decreased safety awareness;Impaired sensation;Decreased cognition;Decreased knowledge of precautions;Decreased knowledge of use of DME or AE      OT Treatment/Interventions: Self-care/ADL training;Therapeutic exercise;Neuromuscular education;Energy conservation;DME and/or AE instruction;Cognitive remediation/compensation;Therapeutic activities;Patient/family education    OT Goals(Current goals can be found in the care plan section) Acute Rehab OT Goals Patient Stated Goal: "go home" OT Goal Formulation: With patient Time For Goal Achievement: 01/04/20 Potential to Achieve Goals: Fair  OT Frequency: Min 2X/week   Barriers to D/C: Decreased caregiver support;Other (comment)  Husband reports he wants to take her home, but is fearful as she requires extensive assistance.       Co-evaluation              AM-PAC OT "6 Clicks" Daily Activity     Outcome Measure Help  from another person eating meals?: A Little Help from another person taking care of personal grooming?: A Little Help from another person toileting, which includes using toliet, bedpan, or urinal?: A Lot Help from another person bathing (including washing, rinsing, drying)?: A Lot Help from another person to put on and taking off regular upper body clothing?: A Lot Help from another person to put on and taking off regular lower body clothing?: A Lot 6 Click Score: 14   End of Session Nurse Communication: Precautions;Other (comment)(Discussing appropriate time to see patient  for therapy)  Activity Tolerance: No increased pain;Patient tolerated treatment well;Other (comment)(Limited secondary to IV placement attempt) Patient left: in bed;with call bell/phone within reach;with bed alarm set;with nursing/sitter in room;with family/visitor present  OT Visit Diagnosis: Muscle weakness (generalized) (M62.81);Repeated falls (R29.6)                Time: 3300-7622 OT Time Calculation (min): 38 min Charges:  OT General Charges $OT Visit: 1 Visit OT Evaluation $OT Eval Moderate Complexity: 1 Mod OT Treatments $Therapeutic Activity: 38-52 mins  Louanne Belton, MS, OTR/L 12/21/19, 2:00 PM

## 2019-12-21 NOTE — Progress Notes (Signed)
VAST consulted to obtain IV access. Called unit and spoke with pt's nurse, Joy regarding unsuccessful attempts for IV access early this am by 2 RN's and IV team. Joy, RN will work with patient this morning to encourage fluid intake and VAST RN will assess once again utilizing [place] later this morning.

## 2019-12-21 NOTE — Progress Notes (Signed)
Pt very sleepy, lethargic, refusing to drink water, asking to be left alone so she can sleep. IV team nurse saying we need to push fluids so she can get an IV. Messaged Dr. Jarvis Newcomer made aware pt not cooperating with drinking water. Pt denying pain. AOX4.

## 2019-12-21 NOTE — ED Notes (Signed)
IV team not successful. MD notified.

## 2019-12-21 NOTE — Progress Notes (Signed)
Pt. Arrived to unit via stretcher from ED.  Pt. A max assist (5 staff) to move from stretcher to bed. Skin assessed with Michelle,RN. Transport nurse tried looking for pt. A PIV. Pt. Refused stated she will wait for IV team in morning. MD already aware pt. Is without PIV. Explain to pt. The need for PIV. Verbalized understanding. Vitals stable. Room orientation reviewed with pt. Call bell in reach. Will continue to monitor.

## 2019-12-21 NOTE — Progress Notes (Signed)
Pt moved top room 242 to be closer to nurses station as she keeps setting off bed alarm and can not follow directions.

## 2019-12-21 NOTE — Progress Notes (Signed)
PROGRESS NOTE  [name]  HUT:[id number] DOB: 12/12/1965 DOA: 12/20/2019 PCP: [facility]   Brief Narrative: [name] is a 54 y.o. female with a history of recurrent CVAs including right MCA CVA w/residual left hemiparesis, seizure disorder, T2DM, HTN, and urinary incontinence who presented to [facility] ED 4/4 with progressive generalized weakness and repeated falls at home associated with decrease per oral intake. Afebrile and hypotensive on admission, tachycardic with SCr 1.63, above baseline of 0.93. CT head with no acute findings. No hypoxia or acute CXR findings. IV fluids started, MRI and echocardiogram ordered for further stroke evaluation.   Assessment & Plan: Active Problems:   HTN (hypertension)   Recurrent cerebrovascular accidents (CVAs) (HCC)   AKI (acute kidney injury) (HCC)   Fall   Generalized weakness   Hemiparesis affecting left side as late effect of cerebrovascular accident (CVA) (HCC)   Type 2 diabetes mellitus with hyperlipidemia (HCC)   Seizure disorder as sequela of cerebrovascular accident [facility])   Urinary incontinence   Self-care deficit  Generalized weakness: Suspect this is due to progressive deconditioning and recently worsened by dehydration in patient with diminished cerebral and physical reserve due to cerebrovascular disease/hx CVA and obesity.  - PT/OT consulted.  - Continue IVF as below. - D/w husband today  AKI:  - Continue IVF, recheck BMP - Avoid nephrotoxins - Hold ACE-thiazide for now. BP normotensive.  History of recurrent CVA, right MCA CVA with left hemiparesis: No acute CVA on MRI.  - Continue aspirin, statin, risk factor modification.  - Neurology reportedly consulted at admission, though with no acute MRI findings, low suspicion for nonconvulsive seizures.  - No strict indication for echo w/negative neuroimaging.   Seizure disorder:  - Continue depakote. Level 48.   IDT2DM: Poorly controlled with HbA1c 7.9%.  -  Strict control to reduce risk of stroke. Give SSI - Long-acting insulin not reordered and CBGs appear at inpatient goal.  Microcytic anemia of chronic disease: Last iron panel last year showed low-normal levels, though pt's diet has become more sparse and indices quite microcytic. No reports of blood loss, though possibly nutritionally mediated.  - Check iron panel, consider replacement if indicated.  HLD:  - Continue statin, LDL 64.  Asymptomatic pyuria: No Tx indicated  OAB:  - Vesicare was continued. Will need to check PVR to r/o overflow incontinence.  HTN:  - Ok to restart home metoprolol.   Morbid obesity: Body mass index is 48.69 kg/m.   DVT prophylaxis: Lovenox Code Status: Full Family Communication: Husband by phone Disposition Plan: Patient from home with increased weakness and falls, requiring rehabilitation, SNF being pursued. Current barrier is IV fluid requirement for AKI.   Consultants:   None  Procedures:   None  Antimicrobials:  None   Subjective: Alert, oriented, states she's eating breakfast ok this morning but is just falling all the time at home and becoming continuously less able to get back up. This has been gradual and she denies acute or new numbness or weakness focally. Completely denies any dysuria, urinary frequency or urgency, just stable incontinence.   Objective: Vitals:   12/21/19 0242 12/21/19 0822 12/21/19 1121 12/21/19 1604  BP: 128/76 (!) 153/108 139/71 125/78  Pulse: 87 84 81 88  Resp: 20 18 18 17   Temp: 97.8 F (36.6 C) (!) 97.5 F (36.4 C) 98.3 F (36.8 C) 98 F (36.7 C)  TempSrc: Oral Oral Oral Oral  SpO2: 97% 100% 99% 100%  Weight: 120.7 kg  Height: 5\' 2"  (1.575 m)       Intake/Output Summary (Last 24 hours) at 12/21/2019 1614 Last data filed at 12/21/2019 1330 Gross per 24 hour  Intake 480 ml  Output --  Net 480 ml   Filed Weights   12/20/19 2018 12/21/19 0242  Weight: 72.6 kg 120.7 kg   Gen: 54 y.o. female  in no distress Pulm: Non-labored breathing room air. Clear to auscultation bilaterally.  CV: Regular rate and rhythm. No murmur, rub, or gallop. No pitting pedal edema. GI: Abdomen soft, non-tender, non-distended, with normoactive bowel sounds. No organomegaly or masses felt. Ext: Warm, no deformities Skin: No rashes, lesions or ulcers Neuro: Alert and oriented. Left hemiparesis which the patient reports is the same as usual. Still has antigravity strength throughout. Psych: Judgement and insight appear normal. Mood & affect appropriate.   Data Reviewed: I have personally reviewed following labs and imaging studies  CBC: Recent Labs  Lab 12/20/19 2027  WBC 7.9  HGB 9.8*  HCT 32.8*  MCV 64.8*  PLT 223   Basic Metabolic Panel: Recent Labs  Lab 12/20/19 2027  NA 141  K 4.1  CL 103  CO2 26  GLUCOSE 191*  BUN 32*  CREATININE 1.63*  CALCIUM 9.0   GFR: Estimated Creatinine Clearance: 48.8 mL/min (A) (by C-G formula based on SCr of 1.63 mg/dL (H)). Liver Function Tests: Recent Labs  Lab 12/20/19 2027  AST 20  ALT 15  ALKPHOS 37*  BILITOT 0.6  PROT 7.2  ALBUMIN 3.9   No results for input(s): LIPASE, AMYLASE in the last 168 hours. No results for input(s): AMMONIA in the last 168 hours. Coagulation Profile: No results for input(s): INR, PROTIME in the last 168 hours. Cardiac Enzymes: No results for input(s): CKTOTAL, CKMB, CKMBINDEX, TROPONINI in the last 168 hours. BNP (last 3 results) No results for input(s): PROBNP in the last 8760 hours. HbA1C: Recent Labs    12/20/19 2027  HGBA1C 7.9*   CBG: Recent Labs  Lab 12/21/19 0021 12/21/19 0820 12/21/19 1219  GLUCAP 159* 140* 164*   Lipid Profile: Recent Labs    12/21/19 0554  CHOL 127  HDL 42  LDLCALC 64  TRIG 105  CHOLHDL 3.0   Thyroid Function Tests: No results for input(s): TSH, T4TOTAL, FREET4, T3FREE, THYROIDAB in the last 72 hours. Anemia Panel: No results for input(s): VITAMINB12, FOLATE,  FERRITIN, TIBC, IRON, RETICCTPCT in the last 72 hours. Urine analysis:    Component Value Date/Time   COLORURINE AMBER (A) 12/21/2019 0224   APPEARANCEUR CLOUDY (A) 12/21/2019 0224   APPEARANCEUR Clear 12/27/2014 1806   LABSPEC 1.018 12/21/2019 0224   LABSPEC 1.035 12/27/2014 1806   PHURINE 6.0 12/21/2019 0224   GLUCOSEU NEGATIVE 12/21/2019 0224   GLUCOSEU >=500 12/27/2014 1806   HGBUR NEGATIVE 12/21/2019 0224   BILIRUBINUR NEGATIVE 12/21/2019 0224   BILIRUBINUR Negative 12/27/2014 1806   KETONESUR 5 (A) 12/21/2019 0224   PROTEINUR 30 (A) 12/21/2019 0224   NITRITE NEGATIVE 12/21/2019 0224   LEUKOCYTESUR SMALL (A) 12/21/2019 0224   LEUKOCYTESUR Negative 12/27/2014 1806   Recent Results (from the past 240 hour(s))  SARS CORONAVIRUS 2 (TAT 6-24 HRS) Nasopharyngeal Nasopharyngeal Swab     Status: None   Collection Time: 12/21/19 12:30 AM   Specimen: Nasopharyngeal Swab  Result Value Ref Range Status   SARS Coronavirus 2 NEGATIVE NEGATIVE Final    Comment: (NOTE) SARS-CoV-2 target nucleic acids are NOT DETECTED. The SARS-CoV-2 RNA is generally detectable in upper and lower  respiratory specimens during the acute phase of infection. Negative results do not preclude SARS-CoV-2 infection, do not rule out co-infections with other pathogens, and should not be used as the sole basis for treatment or other patient management decisions. Negative results must be combined with clinical observations, patient history, and epidemiological information. The expected result is Negative. Fact Sheet for Patients: SugarRoll.be Fact Sheet for Healthcare Providers: https://www.woods-mathews.com/ This test is not yet approved or cleared by the [place] FDA and  has been authorized for detection and/or diagnosis of SARS-CoV-2 by FDA under an Emergency Use Authorization (EUA). This EUA will remain  in effect (meaning this test can be used) for the duration  of the COVID-19 declaration under Section 56 4(b)(1) of the Act, 21 U.S.C. section 360bbb-3(b)(1), unless the authorization is terminated or revoked sooner. Performed at [facility], [place] [address] [place],  [address]       Radiology Studies: CT Head Wo Contrast  Result Date: 12/20/2019 CLINICAL DATA:  Multiple falls, previous stroke EXAM: CT HEAD WITHOUT CONTRAST TECHNIQUE: Contiguous axial images were obtained from the base of the skull through the vertex without intravenous contrast. COMPARISON:  07/23/2019 FINDINGS: Brain: There is no acute intracranial hemorrhage, mass effect, or edema. There is no new loss of gray-white differentiation. Large chronic right MCA territory infarction again identified with encephalomalacia within the frontoparietal and temporal lobes and posterior insula. Additional patchy and confluent hypoattenuation in the supratentorial white matter is nonspecific but likely reflects stable chronic microvascular ischemic changes. There are chronic small vessel infarcts of the pons. Wallerian degeneration along the right cerebral peduncle. There is no extra-axial fluid collection. Ex vacuo dilatation of right lateral ventricle. Superimposed prominence of the ventricles and sulci reflects stable generalized parenchymal volume loss. Vascular: There is atherosclerotic calcification at the skull base. Skull: Calvarium is unremarkable. Sinuses/Orbits: Minor mucosal thickening.  Orbits are unremarkable. Other: None. IMPRESSION: No acute intracranial abnormality. Stable chronic findings detailed above. Electronically Signed   By: Macy Mis M.D.   On: 12/20/2019 21:02   MR BRAIN WO CONTRAST  Result Date: 12/21/2019 CLINICAL DATA:  Generalized weakness that has increased where several days EXAM: MRI HEAD WITHOUT CONTRAST TECHNIQUE: Multiplanar, multiecho pulse sequences of the brain and surrounding structures were obtained without intravenous contrast. COMPARISON:   07/23/2019 FINDINGS: Brain: Large remote right MCA distribution infarct affecting the posterior division primarily. Encephalomalacia is dense and there is surrounding gliosis. Remote lacunar infarcts in the pons, which is atrophic. Remote lacunar infarct at the anterior left thalamus or genu of the internal capsule. No acute hemorrhage, acute infarct, hydrocephalus, collection, or masslike finding. Vascular: Grossly preserved flow voids Skull and upper cervical spine: Normal marrow signal Sinuses/Orbits: Negative Other: Significantly motion degraded brain MRI with some nondiagnostic slices despite fast brain protocol IMPRESSION: 1. Motion degraded brain MRI without acute finding. 2. Large remote right MCA distribution infarct. 3. Chronic small vessel ischemia including remote lacunar infarcts in the pons. Electronically Signed   By: Monte Fantasia M.D.   On: 12/21/2019 10:50   DG Chest Portable 1 View  Result Date: 12/20/2019 CLINICAL DATA:  Weakness EXAM: PORTABLE CHEST 1 VIEW COMPARISON:  07/23/2019 FINDINGS: The heart size and mediastinal contours are within normal limits. Both lungs are clear. The visualized skeletal structures are unremarkable. IMPRESSION: No active disease. Electronically Signed   By: Ulyses Jarred M.D.   On: 12/20/2019 22:55    Scheduled Meds: . [START ON 12/22/2019] divalproex  250 mg Oral Daily  And  . divalproex  500 mg Oral Daily  . enoxaparin (LOVENOX) injection  40 mg Subcutaneous Q12H  . insulin aspart  0-15 Units Subcutaneous TID WC  . insulin aspart  0-5 Units Subcutaneous QHS   Continuous Infusions: . sodium chloride 100 mL/hr at 12/21/19 1227  . sodium chloride       LOS: 1 day   Time spent: 25 minutes.  Tyrone Nine, MD [facility] [email or web address] 12/21/2019, 4:14 PM

## 2019-12-21 NOTE — Progress Notes (Signed)
PT Cancellation Note  Patient Details Name: [name] MRN: [id number] DOB: 18-Oct-1965   Cancelled Treatment:    Reason Eval/Treat Not Completed: (Consult received and chart reviewed. Patient currently with IV team for IV placement; will re-attempt in PM as medically appropriate and available.)   Elaisha Zahniser H. Manson Passey, PT, DPT, NCS 12/21/19, 11:49 AM [phone]

## 2019-12-21 NOTE — ED Notes (Signed)
IV team at bedside at this time. 

## 2019-12-21 NOTE — Evaluation (Signed)
Physical Therapy Evaluation Patient Details Name: [name] MRN: [id number] DOB: 08/12/1966 Today's Date: 12/21/2019   History of Present Illness  [name] is a 54 y.o. female with medical history significant for recurrent CVAs with residual left hemiparesis and seizure disorder, diabetes, hypertension, urinary incontinence who was brought into the emergency room with a several day history of generalized weakness, causing falls, worsening in the past day to where patient will not get out of bed.  MRI significant for remote R MCA infarct, but no acute findings.  Clinical Impression  Patient resting with eyes closed upon arrival to room, but easily awakens to voice/light touch.  Flat affect throughout session, but follows simple commands with min encouragement from therapist.  Increased time required for processing and task initiation.  Baseline L hemiparesis noted (3 to 4-/5 throughout L hemi-body) with moderate sensory deficits L LE (knee distally).  Formal testing somewhat difficult, as patients tend to close eyes and disengage with therapist with more difficult tasks. Currently requiring min/mod assist for bed mobility; min assist for sit/stand, basic transfers and gait (25') with RW, min assist.  Demonstrates forward flexed posture, inconsistent step height/length, min assist for walker position and management; min cuing for attention to task and sustained activity. Would benefit from skilled PT to address above deficits and promote optimal return to PLOF.; Recommend transition to HHPT upon discharge from acute hospitalization.     Follow Up Recommendations Home health PT    Equipment Recommendations       Recommendations for Other Services       Precautions / Restrictions Precautions Precautions: Fall Precaution Comments: aspiration and seizure Restrictions Weight Bearing Restrictions: No      Mobility  Bed Mobility Overal bed mobility: Needs Assistance Bed Mobility: Supine to  Sit   Sidelying to sit: Supervision;HOB elevated Supine to sit: Min guard;Supervision     General bed mobility comments: assist for L LE management, patient quick to request assist prior to initiating indep  Transfers Overall transfer level: Needs assistance Equipment used: Rolling walker (2 wheeled) Transfers: Sit to/from Stand Sit to Stand: Min assist         General transfer comment: cuing for hand placement and sequencing  Ambulation/Gait Ambulation/Gait assistance: Min assist Gait Distance (Feet): 25 Feet Assistive device: Rolling walker (2 wheeled)       General Gait Details: forward flexed posture, inconsistent step height/length, min assist for walker position and management; min cuing for attention to task and sustained activity.  Stairs            Wheelchair Mobility    Modified Rankin (Stroke Patients Only)       Balance Overall balance assessment: Needs assistance Sitting-balance support: No upper extremity supported;Feet supported Sitting balance-Leahy Scale: Good     Standing balance support: Bilateral upper extremity supported Standing balance-Leahy Scale: Fair                               Pertinent Vitals/Pain Pain Assessment: No/denies pain    Home Living Family/patient expects to be discharged to:: Private residence Living Arrangements: Spouse/significant other Available Help at Discharge: Family;Available PRN/intermittently Type of Home: House Home Access: Stairs to enter Entrance Stairs-Rails: (Uncertain which side or if both.) Entrance Stairs-Number of Steps: 3 in front and in back.  Prefers back entrace as hand rails are in place. Home Layout: One level Home Equipment: Cane - single point;Walker - 2 wheels;Grab bars - tub/shower;Shower seat  Prior Function Level of Independence: Needs assistance   Gait / Transfers Assistance Needed: Per husband, patient will transfer and perform short mobility distances  within the home with his assistance.  ADL's / Homemaking Assistance Needed: Husband performs all IADLs and homemaking.  Per husband, he assists with all self-care.  Reports pain may be able to dress herself on some occasions.  Patient is incontinent.        Hand Dominance   Dominant Hand: Right    Extremity/Trunk Assessment   Upper Extremity Assessment Upper Extremity Assessment: (bilat UE grossly 4-/5 throughout; mild coordination/attention deficit to L UE)    Lower Extremity Assessment Lower Extremity Assessment: (L LE grossly 3+ to 4-/5; R LE grossly 4+/5.  Suspected sensory deficit L knee distally; testing somewhat difficulty as patient closes eyes and opts not to interact once sensory testing started.)    Cervical / Trunk Assessment Cervical / Trunk Assessment: Normal  Communication   Communication: (Requires extra time for processing but is alert and oriented.)  Cognition Arousal/Alertness: Awake/alert Behavior During Therapy: Flat affect Overall Cognitive Status: Impaired/Different from baseline Area of Impairment: Memory;Safety/judgement                     Memory: Decreased short-term memory   Safety/Judgement: Decreased awareness of deficits;Decreased awareness of safety     General Comments: limited spontaneous interaction with therapist; delayed processing and task initiation apparent; limited insight into deficits and overall safety needs      General Comments General comments (skin integrity, edema, etc.): Demonstrates some confusion and poor memory.  Patient demonstrates limited motivation to participate in therapy but wants to return home.    Exercises Other Exercises Other Exercises: Sit/stand from edge of bed, recliner surfaces, min assist +1 with RW; min cuing for hand placement and eccentric control with stand to sit Other Exercises: Provided education on general safety (i.e. use of call light, bed controls, etc) Other Exercises: Performed bed  mobility with SPV-MOD A.  Unable to full assess mobility nurse needed to place IV.   Assessment/Plan    PT Assessment Patient needs continued PT services  PT Problem List Decreased strength;Decreased range of motion;Decreased activity tolerance;Decreased balance;Decreased mobility;Decreased coordination;Decreased knowledge of use of DME;Decreased safety awareness;Decreased knowledge of precautions;Decreased skin integrity;Cardiopulmonary status limiting activity;Decreased cognition       PT Treatment Interventions DME instruction;Gait training;Functional mobility training;Therapeutic activities;Therapeutic exercise;Balance training;Cognitive remediation;Patient/family education;Neuromuscular re-education;Stair training    PT Goals (Current goals can be found in the Care Plan section)  Acute Rehab PT Goals Patient Stated Goal: "go home" PT Goal Formulation: With patient Time For Goal Achievement: 01/04/20 Potential to Achieve Goals: Fair    Frequency Min 2X/week   Barriers to discharge        Co-evaluation               AM-PAC PT "6 Clicks" Mobility  Outcome Measure Help needed turning from your back to your side while in a flat bed without using bedrails?: None Help needed moving from lying on your back to sitting on the side of a flat bed without using bedrails?: A Lot Help needed moving to and from a bed to a chair (including a wheelchair)?: A Little Help needed standing up from a chair using your arms (e.g., wheelchair or bedside chair)?: A Little Help needed to walk in hospital room?: A Little Help needed climbing 3-5 steps with a railing? : A Lot 6 Click Score: 17    End of Session  Equipment Utilized During Treatment: Gait belt Activity Tolerance: Patient tolerated treatment well Patient left: in chair;with call bell/phone within reach;with chair alarm set Nurse Communication: Mobility status PT Visit Diagnosis: Muscle weakness (generalized) (M62.81);Difficulty in  walking, not elsewhere classified (R26.2)    Time: 1428-1510 PT Time Calculation (min) (ACUTE ONLY): 42 min   Charges:   PT Evaluation $PT Eval Moderate Complexity: 1 Mod PT Treatments $Therapeutic Activity: 8-22 mins        Macy Polio H. Manson Passey, PT, DPT, NCS 12/21/19, 5:21 PM [phone]

## 2019-12-21 NOTE — Progress Notes (Incomplete)
VAST consulted to obtain IV access. IV attempts completed earlier by 2 RN's and IV team unsuccessful. Called unit and spoke with  ?

## 2019-12-21 NOTE — ED Notes (Signed)
Admitting MD notified of unsuccessful IV attempts by 2 RNs and IV team. MD suggests pt hydrates orally for a few hours then re-attempt IV placement.

## 2019-12-22 DIAGNOSIS — I639 Cerebral infarction, unspecified: Secondary | ICD-10-CM

## 2019-12-22 LAB — BASIC METABOLIC PANEL
Anion gap: 7 (ref 5–15)
BUN: 20 mg/dL (ref 6–20)
CO2: 28 mmol/L (ref 22–32)
Calcium: 8.5 mg/dL — ABNORMAL LOW (ref 8.9–10.3)
Chloride: 107 mmol/L (ref 98–111)
Creatinine, Ser: 0.9 mg/dL (ref 0.44–1.00)
GFR calc Af Amer: >60 mL/min (ref >60)
GFR calc non Af Amer: >60 mL/min (ref >60)
Glucose, Bld: 143 mg/dL — ABNORMAL HIGH (ref 70–99)
Potassium: 4 mmol/L (ref 3.5–5.1)
Sodium: 142 mmol/L (ref 135–145)

## 2019-12-22 LAB — IRON AND TIBC
Iron: 29 ug/dL (ref 28–170)
Saturation Ratios: 9 % — ABNORMAL LOW (ref 10.4–31.8)
TIBC: 322 ug/dL (ref 250–450)
UIBC: 293 ug/dL

## 2019-12-22 LAB — GLUCOSE, CAPILLARY
Glucose-Capillary: 129 mg/dL — ABNORMAL HIGH (ref 70–99)
Glucose-Capillary: 184 mg/dL — ABNORMAL HIGH (ref 70–99)

## 2019-12-22 LAB — FERRITIN: Ferritin: 14 ng/mL (ref 11–307)

## 2019-12-22 MED ORDER — SODIUM CHLORIDE 0.9 % IV SOLN
2.0000 g | INTRAVENOUS | Status: DC
  Filled 2019-12-22: qty 20

## 2019-12-22 MED ORDER — FOSFOMYCIN TROMETHAMINE 3 G PO PACK
3.0000 g | PACK | Freq: Once | ORAL | Status: AC
  Administered 2019-12-22: 3 g via ORAL
  Filled 2019-12-22 (×3): qty 3

## 2019-12-22 NOTE — Discharge Summary (Signed)
Physician Discharge Summary  [name] GEX:[id number] DOB: [birth year] DOA: 12/20/2019  PCP: [facility]  Admit date: 12/20/2019 Discharge date: 12/22/2019  Admitted From: Home Disposition: Home   Recommendations for Outpatient Follow-up:  1. Follow up with PCP in 1-2 weeks  Home Health: PT, OT, aide, CSW Equipment/Devices: Hospital bed Discharge Condition: Stable CODE STATUS: Full Diet recommendation: Heart healthy, carb-modified   Brief/Interim Summary: [name] is a 54 y.o. female with a history of recurrent CVAs including right MCA CVA w/residual left hemiparesis, seizure disorder, T2DM, HTN, and urinary incontinence who presented to [facility] ED 4/4 with progressive generalized weakness and repeated falls at home associated with decrease per oral intake. Afebrile and hypotensive on admission, tachycardic with SCr 1.63, above baseline of 0.93. CT head with no acute findings. No hypoxia or acute CXR findings. IV fluids started, MRI and echocardiogram ordered for further stroke evaluation. No stroke was noted on further evaluation. Weakness is felt to be multifactorial and would benefit from home health therapies.  Discharge Diagnoses:  Active Problems:   HTN (hypertension)   Recurrent cerebrovascular accidents (CVAs) (HCC)   AKI (acute kidney injury) (HCC)   Fall   Generalized weakness   Hemiparesis affecting left side as late effect of cerebrovascular accident (CVA) (HCC)   Type 2 diabetes mellitus with hyperlipidemia (HCC)   Seizure disorder as sequela of cerebrovascular accident [facility])   Urinary incontinence   Self-care deficit  Generalized weakness: Suspect this is due to progressive deconditioning and recently worsened by dehydration in patient with diminished cerebral and physical reserve due to cerebrovascular disease/hx CVA and obesity.  - PT/OT to continue at home.    AKI due to dehydration: Resolved at time of discharge.  - Ok to restart medications  as she's taking adequate po. BP normotensive.  History of recurrent CVA, right MCA CVA with left hemiparesis: No acute CVA on MRI.  - Continue aspirin, statin, risk factor modification.   Seizure disorder:  - Continue depakote. Level 48.   IDT2DM: Poorly controlled with HbA1c 7.9%.  - Strict control to reduce risk of stroke.    Microcytic anemia of chronic disease: Last iron panel last year showed low-normal levels, though pt's diet has become more sparse and indices quite microcytic. No reports of blood loss, though possibly nutritionally mediated.  - Consider starting iron supplement, defer to PCP. - No evidence of bleeding reported or seen on exam.  HLD:  - Continue statin, LDL 64.  Asymptomatic bacteriuria: No Tx indicated  OAB:  - Vesicare was continued.   HTN:  - Ok to restart home metoprolol.   Morbid obesity: Body mass index is 48.69 kg/m.   Discharge Instructions Discharge Instructions    Diet - low sodium heart healthy   Complete by: As directed    Discharge instructions   Complete by: As directed    You were evaluated for weakness and repeated falling. The work up for stroke was negative, though you were dehydrated. You are improved after IV fluids and are tolerating a diet, so are stable for discharge with the following recommendations:  - Continue physical therapy at home, make sure to drink plenty of fluids.  - Follow up with your doctor in the next 1-2 weeks.  - Seek medical attention if your symptoms return.   Increase activity slowly   Complete by: As directed      Allergies as of 12/22/2019   No Known Allergies     Medication List  TAKE these medications   aspirin 325 MG EC tablet Take 1 tablet (325 mg total) by mouth daily.   atorvastatin 40 MG tablet Commonly known as: LIPITOR Take 1 tablet (40 mg total) by mouth daily at 6 PM.   divalproex 250 MG DR tablet Commonly known as: DEPAKOTE Take 250-500 mg by mouth 2 (two) times daily.  Take one tablet (250 mg) by mouth every morning and two tablets (500 mg) at bedtime   gabapentin 300 MG capsule Commonly known as: NEURONTIN Take 300-900 mg by mouth 2 (two) times daily. Take one capsule (300 mg) by mouth every morning and two capsules (600 mg) at bedtime. (May take an additional 300 mg at bedtime as needed)   glipiZIDE 5 MG 24 hr tablet Commonly known as: GLUCOTROL XL Take 1 tablet (5 mg total) by mouth daily.   insulin aspart 100 UNIT/ML FlexPen Commonly known as: NovoLOG FlexPen Inject 10 Units into the skin 3 (three) times daily with meals. What changed: how much to take   insulin detemir 100 UNIT/ML FlexPen Commonly known as: Levemir FlexPen Inject 40 Units into the skin daily at 10 pm. What changed: how much to take   lisinopril-hydrochlorothiazide 20-12.5 MG tablet Commonly known as: ZESTORETIC Take 1 tablet by mouth daily.   metFORMIN 500 MG 24 hr tablet Commonly known as: GLUCOPHAGE-XR Take 500 mg by mouth 2 (two) times daily.   metoprolol tartrate 25 MG tablet Commonly known as: LOPRESSOR Take 25 mg by mouth 2 (two) times a day.   saccharomyces boulardii 250 MG capsule Commonly known as: FLORASTOR Take 250 mg by mouth 2 (two) times daily.   VESIcare 10 MG tablet Generic drug: solifenacin Take 10 mg by mouth 2 (two) times daily.            Durable Medical Equipment  (From admission, onward)         Start     Ordered   12/22/19 1157  For home use only DME 3 n 1  Once    Comments: BARIATRIC   12/22/19 1200         Follow-up Information    [facility]. Schedule an appointment as soon as possible for a visit in 1 week(s).   Specialty: General Practice Contact information: [address] [place] [address] [phone]          No Known Allergies  Consultations:  None  Procedures/Studies: CT Head Wo Contrast  Result Date: 12/20/2019 CLINICAL DATA:  Multiple falls, previous  stroke EXAM: CT HEAD WITHOUT CONTRAST TECHNIQUE: Contiguous axial images were obtained from the base of the skull through the vertex without intravenous contrast. COMPARISON:  07/23/2019 FINDINGS: Brain: There is no acute intracranial hemorrhage, mass effect, or edema. There is no new loss of gray-white differentiation. Large chronic right MCA territory infarction again identified with encephalomalacia within the frontoparietal and temporal lobes and posterior insula. Additional patchy and confluent hypoattenuation in the supratentorial white matter is nonspecific but likely reflects stable chronic microvascular ischemic changes. There are chronic small vessel infarcts of the pons. Wallerian degeneration along the right cerebral peduncle. There is no extra-axial fluid collection. Ex vacuo dilatation of right lateral ventricle. Superimposed prominence of the ventricles and sulci reflects stable generalized parenchymal volume loss. Vascular: There is atherosclerotic calcification at the skull base. Skull: Calvarium is unremarkable. Sinuses/Orbits: Minor mucosal thickening.  Orbits are unremarkable. Other: None. IMPRESSION: No acute intracranial abnormality. Stable chronic findings detailed above. Electronically Signed  By: Macy Mis M.D.   On: 12/20/2019 21:02   MR BRAIN WO CONTRAST  Result Date: 12/21/2019 CLINICAL DATA:  Generalized weakness that has increased where several days EXAM: MRI HEAD WITHOUT CONTRAST TECHNIQUE: Multiplanar, multiecho pulse sequences of the brain and surrounding structures were obtained without intravenous contrast. COMPARISON:  07/23/2019 FINDINGS: Brain: Large remote right MCA distribution infarct affecting the posterior division primarily. Encephalomalacia is dense and there is surrounding gliosis. Remote lacunar infarcts in the pons, which is atrophic. Remote lacunar infarct at the anterior left thalamus or genu of the internal capsule. No acute hemorrhage, acute infarct,  hydrocephalus, collection, or masslike finding. Vascular: Grossly preserved flow voids Skull and upper cervical spine: Normal marrow signal Sinuses/Orbits: Negative Other: Significantly motion degraded brain MRI with some nondiagnostic slices despite fast brain protocol IMPRESSION: 1. Motion degraded brain MRI without acute finding. 2. Large remote right MCA distribution infarct. 3. Chronic small vessel ischemia including remote lacunar infarcts in the pons. Electronically Signed   By: Monte Fantasia M.D.   On: 12/21/2019 10:50   DG Chest Portable 1 View  Result Date: 12/20/2019 CLINICAL DATA:  Weakness EXAM: PORTABLE CHEST 1 VIEW COMPARISON:  07/23/2019 FINDINGS: The heart size and mediastinal contours are within normal limits. Both lungs are clear. The visualized skeletal structures are unremarkable. IMPRESSION: No active disease. Electronically Signed   By: Ulyses Jarred M.D.   On: 12/20/2019 22:55    Subjective: Feels her mobility is at baseline, wants to go home. No numbness, focal weakness or other complaints. She's eating better without nausea or vomiting.  Discharge Exam: Vitals:   12/22/19 0839 12/22/19 1144  BP: (!) 153/83 137/88  Pulse: 77 71  Resp: 17 18  Temp: 97.8 F (36.6 C) 98.2 F (36.8 C)  SpO2: 93% 100%   General: Pt is alert, awake, not in acute distress Cardiovascular: RRR, S1/S2 +, no rubs, no gallops Respiratory: CTA bilaterally, no wheezing, no rhonchi Abdominal: Soft, NT, ND, bowel sounds + Extremities: No edema, no cyanosis  Labs: BNP (last 3 results) Recent Labs    07/23/19 1128  BNP 96.0   Basic Metabolic Panel: Recent Labs  Lab 12/20/19 2027 12/22/19 0832  NA 141 142  K 4.1 4.0  CL 103 107  CO2 26 28  GLUCOSE 191* 143*  BUN 32* 20  CREATININE 1.63* 0.90  CALCIUM 9.0 8.5*   Liver Function Tests: Recent Labs  Lab 12/20/19 2027  AST 20  ALT 15  ALKPHOS 37*  BILITOT 0.6  PROT 7.2  ALBUMIN 3.9   No results for input(s): LIPASE,  AMYLASE in the last 168 hours. No results for input(s): AMMONIA in the last 168 hours. CBC: Recent Labs  Lab 12/20/19 2027  WBC 7.9  HGB 9.8*  HCT 32.8*  MCV 64.8*  PLT 223   Cardiac Enzymes: No results for input(s): CKTOTAL, CKMB, CKMBINDEX, TROPONINI in the last 168 hours. BNP: Invalid input(s): POCBNP CBG: Recent Labs  Lab 12/21/19 1219 12/21/19 1644 12/21/19 2104 12/22/19 0829 12/22/19 1145  GLUCAP 164* 220* 153* 129* 184*   D-Dimer No results for input(s): DDIMER in the last 72 hours. Hgb A1c Recent Labs    12/20/19 2027  HGBA1C 7.9*   Lipid Profile Recent Labs    12/21/19 0554  CHOL 127  HDL 42  LDLCALC 64  TRIG 105  CHOLHDL 3.0   Thyroid function studies No results for input(s): TSH, T4TOTAL, T3FREE, THYROIDAB in the last 72 hours.  Invalid input(s): FREET3 Anemia work  up Recent Labs    12/22/19 0832  FERRITIN 14  TIBC 322  IRON 29   Urinalysis    Component Value Date/Time   COLORURINE AMBER (A) 12/21/2019 0224   APPEARANCEUR CLOUDY (A) 12/21/2019 0224   APPEARANCEUR Clear 12/27/2014 1806   LABSPEC 1.018 12/21/2019 0224   LABSPEC 1.035 12/27/2014 1806   PHURINE 6.0 12/21/2019 0224   GLUCOSEU NEGATIVE 12/21/2019 0224   GLUCOSEU >=500 12/27/2014 1806   HGBUR NEGATIVE 12/21/2019 0224   BILIRUBINUR NEGATIVE 12/21/2019 0224   BILIRUBINUR Negative 12/27/2014 1806   KETONESUR 5 (A) 12/21/2019 0224   PROTEINUR 30 (A) 12/21/2019 0224   NITRITE NEGATIVE 12/21/2019 0224   LEUKOCYTESUR SMALL (A) 12/21/2019 0224   LEUKOCYTESUR Negative 12/27/2014 1806    Microbiology Recent Results (from the past 240 hour(s))  SARS CORONAVIRUS 2 (TAT 6-24 HRS) Nasopharyngeal Nasopharyngeal Swab     Status: None   Collection Time: 12/21/19 12:30 AM   Specimen: Nasopharyngeal Swab  Result Value Ref Range Status   SARS Coronavirus 2 NEGATIVE NEGATIVE Final    Comment: (NOTE) SARS-CoV-2 target nucleic acids are NOT DETECTED. The SARS-CoV-2 RNA is generally  detectable in upper and lower respiratory specimens during the acute phase of infection. Negative results do not preclude SARS-CoV-2 infection, do not rule out co-infections with other pathogens, and should not be used as the sole basis for treatment or other patient management decisions. Negative results must be combined with clinical observations, patient history, and epidemiological information. The expected result is Negative. Fact Sheet for Patients: HairSlick.no Fact Sheet for Healthcare Providers: quierodirigir.com This test is not yet approved or cleared by the [place] FDA and  has been authorized for detection and/or diagnosis of SARS-CoV-2 by FDA under an Emergency Use Authorization (EUA). This EUA will remain  in effect (meaning this test can be used) for the duration of the COVID-19 declaration under Section 56 4(b)(1) of the Act, 21 U.S.C. section 360bbb-3(b)(1), unless the authorization is terminated or revoked sooner. Performed at [facility], [address], [place], [place] [address]     Time coordinating discharge: Approximately 40 minutes  Tyrone Nine, MD  [facility] 12/22/2019, 12:19 PM

## 2019-12-22 NOTE — TOC Initial Note (Addendum)
Transition of Care [facility]) - Initial/Assessment Note    Patient Details  Name: [name] MRN: [id number] Date of Birth: 1965/12/24  Transition of Care [facility]) CM/SW Contact:    Candie Chroman, LCSW Phone Number: 12/22/2019, 10:50 AM  Clinical Narrative:  CSW met with patient. No supports at bedside. CSW introduced role and explained that therapy recommendations would be discussed. Patient agreeable to home health. Preferences are [facility], [facility], and [facility]. Made patient aware that it is often difficult to set up patients with Medicaid with home health. She is agreeable to outpatient therapy if we are unable to find an agency to take her. Patient has a rolling walker at home. She lives with her husband. No further concerns. CSW encouraged patient to contact CSW as needed. CSW will continue to follow patient for support and facilitate return home today.               1:52 pm: [facility], [facility], and [facility] unable to accept patient. Left messages for [facility], [facility], [facility], and [facility].     2:25 pm: [facility], and [facility] unable to accept. CSW spoke to patient's husband who confirmed interest in getting a hospital bed and 3-in-1. CSW ordered through [facility]. Husband aware that they may not be delivered until late today vs. Tomorrow. He is aware of plan for discharge today.  2:44 pm: [facility], [facility], and [facility] unable to accept patient. Left voicemail at [facility]. [facility] does not take Medicaid.  2:59 pm: Patient and husband aware that DME will be delivered to their home tomorrow. Discussed outpatient PT. Patient has had it here at the hospital in the past so they would prefer that. Sent secure chat to MD about signing the order so it can be faxed over.  Expected Discharge Plan: [place] Barriers to Discharge: Barriers Resolved   Patient Goals and CMS Choice     Choice offered to / list presented to :  Patient  Expected Discharge Plan and Services Expected Discharge Plan: [place] Acute Care Choice: [place] arrangements for the past 2 months: Single Family Home Expected Discharge Date: 12/22/19                                    Prior Living Arrangements/Services Living arrangements for the past 2 months: Single Family Home Lives with:: Spouse Patient language and need for interpreter reviewed:: Yes Do you feel safe going back to the place where you live?: Yes      Need for Family Participation in Patient Care: Yes (Comment) Care giver support system in place?: Yes (comment) Current home services: DME Criminal Activity/Legal Involvement Pertinent to Current Situation/Hospitalization: No - Comment as needed  Activities of Daily Living Home Assistive Devices/Equipment: None, Walker (specify type) ADL Screening (condition at time of admission) Patient's cognitive ability adequate to safely complete daily activities?: Yes Is the patient deaf or have difficulty hearing?: No Does the patient have difficulty seeing, even when wearing glasses/contacts?: Yes Does the patient have difficulty concentrating, remembering, or making decisions?: Yes Patient able to express need for assistance with ADLs?: No Does the patient have difficulty dressing or bathing?: Yes Independently performs ADLs?: No Communication: Needs assistance Is this a change from baseline?: Pre-admission baseline Dressing (OT): Needs assistance Is this a change from baseline?: Pre-admission baseline Grooming: Needs assistance Is this a  change from baseline?: Pre-admission baseline Feeding: Needs assistance Is this a change from baseline?: Pre-admission baseline Bathing: Needs assistance Is this a change from baseline?: Pre-admission baseline Toileting: Needs assistance Is this a change from baseline?: Pre-admission baseline In/Out Bed: Needs assistance Is this a  change from baseline?: Pre-admission baseline Walks in Home: Needs assistance Is this a change from baseline?: Pre-admission baseline Does the patient have difficulty walking or climbing stairs?: Yes Weakness of Legs: Both Weakness of Arms/Hands: Both  Permission Sought/Granted Permission sought to share information with : Facility Art therapist granted to share information with : Yes, Verbal Permission Granted     Permission granted to share info w AGENCY: Home Health Agencies        Emotional Assessment Appearance:: Appears stated age Attitude/Demeanor/Rapport: Engaged, Gracious Affect (typically observed): Accepting, Appropriate, Calm, Pleasant Orientation: : Oriented to Self, Oriented to Place, Oriented to  Time, Oriented to Situation Alcohol / Substance Use: Not Applicable Psych Involvement: No (comment)  Admission diagnosis:  Dehydration [E86.0] Generalized weakness [R53.1] Patient Active Problem List   Diagnosis Date Noted  . Generalized weakness 12/20/2019  . Hemiparesis affecting left side as late effect of cerebrovascular accident (CVA) ([place]) 12/20/2019  . Type 2 diabetes mellitus with hyperlipidemia ([place]) 12/20/2019  . Seizure disorder as sequela of cerebrovascular accident ([place]) 12/20/2019  . Urinary incontinence 12/20/2019  . Self-care deficit 12/20/2019  . Fall   . Lacunar stroke, acute ([place]) 07/24/2019  . Seizure ([place]) 07/24/2019  . Elevated troponin 07/24/2019  . AKI (acute kidney injury) ([place]) 07/24/2019  . Acute metabolic encephalopathy 41/93/7902  . Cerebral infarction ([place]) 08/14/2016  . Recurrent cerebrovascular accidents (CVAs) ([place]) 08/14/2016  . Delirium due to another medical condition 08/13/2016  . Urinary tract infection 08/13/2016  . Right foot infection 05/11/2016  . Foot abscess, right 05/11/2016  . Anxiety and depression 12/02/2015  . CVA (cerebral infarction) 11/27/2015  . Epilepsy ([place]) 06/17/2015  . Complicated  migraine 40/97/3532  . Headache 05/19/2015  . Weakness of left side of body 05/19/2015  . History of stroke 05/19/2015  . Severe obesity (BMI >= 40) ([place]) 03/19/2014  . HTN (hypertension)   . Diabetes mellitus without complication ([place])   . Hyperlipidemia   . [name] (transient ischemic attack) 03/17/2014   PCP:  [facility], [place]:   [place] [address] [place] [address] [place]) [place] [address] Phone: [phone] Fax: [phone]     Social Determinants of Health (SDOH) Interventions    Readmission Risk Interventions Readmission Risk Prevention Plan 07/25/2019  Post Dischage Appt Complete  Medication Screening Complete  Transportation Screening Complete  Some recent data might be hidden

## 2019-12-22 NOTE — Plan of Care (Signed)

## 2019-12-22 NOTE — Clinical Social Work Note (Signed)
Patient suffers from recurrent CVA's, Hemiparesis affecting the left side as late effect of her CVA's, and a seizure disorder as sequela of her CVA's which requires repositioning of body in ways not feasible in a standard bed. Head must be elevated at least 30 degrees or more or severe pain occurs.  Charlynn Court, CSW [phone]

## 2019-12-22 NOTE — Progress Notes (Signed)
Occupational Therapy Treatment Patient Details Name: [name] MRN: [id number] DOB: January 12, 1966 Today's Date: 12/22/2019    History of present illness [name] is a 54 y.o. female with medical history significant for recurrent CVAs with residual left hemiparesis and seizure disorder, diabetes, hypertension, urinary incontinence who was brought into the emergency room with a several day history of generalized weakness, causing falls, worsening in the past day to where patient will not get out of bed.  MRI significant for remote R MCA infarct, BUT NO ACUTE FINDINGS.   OT comments  Patient sitting up in recliner upon entry and agreeable to therapy.  Patient states she is feeling better.  Noticed IV was unattached; unclear if patient pulled it out.  Notified nurse.  Patient demonstrating increased alertness and general conversation this date.  Participated in functional transfer training focusing on sit<>stand with improved body mechanics and safety to reduce caregiver burden during toileting and dressing tasks.  Required MIN A with education on sequencing and body mechanics; noted L lateral lean.  Provided education on importance of using telephone or call light to alert nurses if pt requires assistance or needs to get up.  Patient nodded head.  Notified nurse of chair alarm and other needs.  Pt would benefit from continued occupational therapy intervention to address safety awareness, functional transfers, family training, standing balance, EC techniques, and ADL retraining.  Based on today's improved participation and performance, recommending [place] OT with 24/7 SPV at time of discharge.     Follow Up Recommendations  Home health OT    Equipment Recommendations  3 in 1 bedside commode    Recommendations for Other Services      Precautions / Restrictions Precautions Precautions: Fall Precaution Comments: aspiration and seizure Restrictions Weight Bearing Restrictions: No       Mobility Bed  Mobility               General bed mobility comments: Patient seated in reciner upon entry.  Transfers Overall transfer level: Needs assistance Equipment used: Rolling walker (2 wheeled) Transfers: Sit to/from Stand Sit to Stand: Min assist         General transfer comment: General education on sequencing and body mechanics.  ABle to maintain static standing for ~15 seconds at a time with CGA.    Balance                                           ADL either performed or assessed with clinical judgement   ADL Overall ADL's : Needs assistance/impaired     Grooming: Wash/dry hands;Wash/dry face;Sitting;Set up Grooming Details (indicate cue type and reason): seated with back supported             Lower Body Dressing: Moderate assistance Lower Body Dressing Details (indicate cue type and reason): MOD A sitting to don socks Toilet Transfer: Minimal assistance;RW     Toileting - Clothing Manipulation Details (indicate cue type and reason): Patient is incontinent       General ADL Comments: Patient demonstrated increased participation this date.     Vision Patient Visual Report: No change from baseline     Perception     Praxis      Cognition Arousal/Alertness: Awake/alert Behavior During Therapy: WFL for tasks assessed/performed Overall Cognitive Status: Within Functional Limits for tasks assessed  General Comments: Continued to display deficits in impulse control, safety awareness and general recall.  Able to appropriately answer therapist's questions this date.        Exercises Other Exercises Other Exercises: Patient demonstrated improved participation.  Requiring MIN A for sit<>stand transfers.  Requires CGA-MIN A for controlled sitting.  Demonstrates poor awareness of posture while seated and standing. Other Exercises: Provided education on general safety (i.e. use of call light, bed  controls, etc)   Shoulder Instructions       General Comments      Pertinent Vitals/ Pain       Pain Assessment: No/denies pain  Home Living                                          Prior Functioning/Environment              Frequency  Min 2X/week        Progress Toward Goals  OT Goals(current goals can now be found in the care plan section)  Progress towards OT goals: Progressing toward goals     Plan Discharge plan needs to be updated;Frequency remains appropriate    Co-evaluation                 AM-PAC OT "6 Clicks" Daily Activity     Outcome Measure   Help from another person eating meals?: A Little Help from another person taking care of personal grooming?: A Little Help from another person toileting, which includes using toliet, bedpan, or urinal?: A Lot Help from another person bathing (including washing, rinsing, drying)?: A Lot Help from another person to put on and taking off regular upper body clothing?: A Little Help from another person to put on and taking off regular lower body clothing?: A Lot 6 Click Score: 15    End of Session Equipment Utilized During Treatment: Gait belt;Rolling walker  OT Visit Diagnosis: Muscle weakness (generalized) (M62.81);Repeated falls (R29.6)   Activity Tolerance No increased pain;Patient tolerated treatment well   Patient Left in chair;with call bell/phone within reach;with chair alarm set   Nurse Communication Precautions;Other (comment)(Patient has apparently torn out IV.  Discussed need for alarm pad in recliner.)        Time: 1030-1103 OT Time Calculation (min): 33 min  Charges: OT General Charges $OT Visit: 1 Visit OT Treatments $Self Care/Home Management : 8-22 mins $Therapeutic Activity: 8-22 mins  Louanne Belton, MS, OTR/L 12/22/19, 11:20 AM

## 2019-12-22 NOTE — TOC Transition Note (Signed)
Transition of Care [facility]) - CM/SW Discharge Note   Patient Details  Name: [name] MRN: [id number] Date of Birth: July 18, 1966  [facility] CM/SW Contact:  Margarito Liner, LCSW Phone Number: 12/22/2019, 3:46 PM   Clinical Narrative:  Patient has orders to discharge home today. Faxed order for outpatient PT and OT to [facility] at the hospital campus. No further concerns. CSW signing off.   Final next level of care: OP Rehab Barriers to Discharge: Barriers Resolved   Patient Goals and CMS Choice     Choice offered to / list presented to : Patient  Discharge Placement                Patient to be transferred to facility by: Husband took her home. Name of family member notified: Henriette Hesser Patient and family notified of of transfer: 12/22/19  Discharge Plan and Services     Post Acute Care Choice: Home Health          DME Arranged: 3-N-1, Hospital bed DME Agency: [employer] Date DME Agency Contacted: 12/22/19   Representative spoke with at DME Agency: Mitchell Heir            Social Determinants of Health (SDOH) Interventions     Readmission Risk Interventions Readmission Risk Prevention Plan 07/25/2019  Post Dischage Appt Complete  Medication Screening Complete  Transportation Screening Complete  Some recent data might be hidden

## 2019-12-24 LAB — URINE CULTURE: Culture: >=100000 — AB

## 2020-02-24 ENCOUNTER — Observation Stay: Payer: Medicaid Other

## 2020-02-24 ENCOUNTER — Inpatient Hospital Stay
Admission: EM | Admit: 2020-02-24 | Discharge: 2020-03-03 | DRG: 193 | Disposition: A | Discharge status: Home / Self Care | Payer: Medicaid Other | Attending: Internal Medicine | Admitting: Internal Medicine

## 2020-02-24 ENCOUNTER — Emergency Department: Payer: Medicaid Other

## 2020-02-24 ENCOUNTER — Other Ambulatory Visit: Payer: Self-pay

## 2020-02-24 DIAGNOSIS — R29810 Facial weakness: Secondary | ICD-10-CM | POA: Diagnosis present

## 2020-02-24 DIAGNOSIS — N179 Acute kidney failure, unspecified: Secondary | ICD-10-CM | POA: Diagnosis not present

## 2020-02-24 DIAGNOSIS — R4781 Slurred speech: Secondary | ICD-10-CM | POA: Diagnosis present

## 2020-02-24 DIAGNOSIS — R531 Weakness: Secondary | ICD-10-CM | POA: Diagnosis not present

## 2020-02-24 DIAGNOSIS — Z8249 Family history of ischemic heart disease and other diseases of the circulatory system: Secondary | ICD-10-CM

## 2020-02-24 DIAGNOSIS — R0602 Shortness of breath: Secondary | ICD-10-CM

## 2020-02-24 DIAGNOSIS — Z833 Family history of diabetes mellitus: Secondary | ICD-10-CM

## 2020-02-24 DIAGNOSIS — Z20822 Contact with and (suspected) exposure to covid-19: Secondary | ICD-10-CM | POA: Diagnosis present

## 2020-02-24 DIAGNOSIS — Z8673 Personal history of transient ischemic attack (TIA), and cerebral infarction without residual deficits: Secondary | ICD-10-CM

## 2020-02-24 DIAGNOSIS — G40909 Epilepsy, unspecified, not intractable, without status epilepticus: Secondary | ICD-10-CM

## 2020-02-24 DIAGNOSIS — Z794 Long term (current) use of insulin: Secondary | ICD-10-CM

## 2020-02-24 DIAGNOSIS — G9389 Other specified disorders of brain: Secondary | ICD-10-CM | POA: Diagnosis present

## 2020-02-24 DIAGNOSIS — Z79899 Other long term (current) drug therapy: Secondary | ICD-10-CM

## 2020-02-24 DIAGNOSIS — E119 Type 2 diabetes mellitus without complications: Secondary | ICD-10-CM | POA: Diagnosis not present

## 2020-02-24 DIAGNOSIS — Z800 Family history of malignant neoplasm of digestive organs: Secondary | ICD-10-CM

## 2020-02-24 DIAGNOSIS — I10 Essential (primary) hypertension: Secondary | ICD-10-CM | POA: Diagnosis not present

## 2020-02-24 DIAGNOSIS — G4733 Obstructive sleep apnea (adult) (pediatric): Secondary | ICD-10-CM | POA: Diagnosis present

## 2020-02-24 DIAGNOSIS — E876 Hypokalemia: Secondary | ICD-10-CM | POA: Diagnosis present

## 2020-02-24 DIAGNOSIS — Z23 Encounter for immunization: Secondary | ICD-10-CM

## 2020-02-24 DIAGNOSIS — G9341 Metabolic encephalopathy: Secondary | ICD-10-CM | POA: Diagnosis present

## 2020-02-24 DIAGNOSIS — J189 Pneumonia, unspecified organism: Secondary | ICD-10-CM

## 2020-02-24 DIAGNOSIS — R9401 Abnormal electroencephalogram [EEG]: Secondary | ICD-10-CM | POA: Diagnosis present

## 2020-02-24 DIAGNOSIS — E785 Hyperlipidemia, unspecified: Secondary | ICD-10-CM | POA: Diagnosis present

## 2020-02-24 DIAGNOSIS — Z6841 Body Mass Index (BMI) 40.0 and over, adult: Secondary | ICD-10-CM

## 2020-02-24 DIAGNOSIS — Z87440 Personal history of urinary (tract) infections: Secondary | ICD-10-CM

## 2020-02-24 DIAGNOSIS — E1169 Type 2 diabetes mellitus with other specified complication: Secondary | ICD-10-CM | POA: Diagnosis present

## 2020-02-24 DIAGNOSIS — F329 Major depressive disorder, single episode, unspecified: Secondary | ICD-10-CM | POA: Diagnosis present

## 2020-02-24 DIAGNOSIS — Z7902 Long term (current) use of antithrombotics/antiplatelets: Secondary | ICD-10-CM

## 2020-02-24 DIAGNOSIS — J181 Lobar pneumonia, unspecified organism: Principal | ICD-10-CM | POA: Diagnosis present

## 2020-02-24 DIAGNOSIS — G459 Transient cerebral ischemic attack, unspecified: Secondary | ICD-10-CM

## 2020-02-24 DIAGNOSIS — E860 Dehydration: Secondary | ICD-10-CM | POA: Diagnosis present

## 2020-02-24 DIAGNOSIS — R32 Unspecified urinary incontinence: Secondary | ICD-10-CM | POA: Diagnosis present

## 2020-02-24 DIAGNOSIS — I69354 Hemiplegia and hemiparesis following cerebral infarction affecting left non-dominant side: Secondary | ICD-10-CM

## 2020-02-24 DIAGNOSIS — D509 Iron deficiency anemia, unspecified: Secondary | ICD-10-CM

## 2020-02-24 DIAGNOSIS — R4182 Altered mental status, unspecified: Secondary | ICD-10-CM

## 2020-02-24 DIAGNOSIS — I472 Ventricular tachycardia: Secondary | ICD-10-CM | POA: Diagnosis present

## 2020-02-24 DIAGNOSIS — F419 Anxiety disorder, unspecified: Secondary | ICD-10-CM | POA: Diagnosis present

## 2020-02-24 DIAGNOSIS — Z7982 Long term (current) use of aspirin: Secondary | ICD-10-CM

## 2020-02-24 LAB — COMPREHENSIVE METABOLIC PANEL
ALT: 13 U/L (ref 0–44)
AST: 15 U/L (ref 15–41)
Albumin: 3.8 g/dL (ref 3.5–5.0)
Alkaline Phosphatase: 34 U/L — ABNORMAL LOW (ref 38–126)
Anion gap: 11 (ref 5–15)
BUN: 23 mg/dL — ABNORMAL HIGH (ref 6–20)
CO2: 25 mmol/L (ref 22–32)
Calcium: 8.8 mg/dL — ABNORMAL LOW (ref 8.9–10.3)
Chloride: 103 mmol/L (ref 98–111)
Creatinine, Ser: 1.16 mg/dL — ABNORMAL HIGH (ref 0.44–1.00)
GFR calc Af Amer: >60 mL/min (ref >60)
GFR calc non Af Amer: 53 mL/min — ABNORMAL LOW (ref >60)
Glucose, Bld: 148 mg/dL — ABNORMAL HIGH (ref 70–99)
Potassium: 3.3 mmol/L — ABNORMAL LOW (ref 3.5–5.1)
Sodium: 139 mmol/L (ref 135–145)
Total Bilirubin: 1.1 mg/dL (ref 0.3–1.2)
Total Protein: 7.1 g/dL (ref 6.5–8.1)

## 2020-02-24 LAB — CBC WITH DIFFERENTIAL/PLATELET
Abs Immature Granulocytes: 0.04 10*3/uL (ref 0.00–0.07)
Basophils Absolute: 0.1 10*3/uL (ref 0.0–0.1)
Basophils Relative: 0 %
Eosinophils Absolute: 0.1 10*3/uL (ref 0.0–0.5)
Eosinophils Relative: 1 %
HCT: 34.1 % — ABNORMAL LOW (ref 36.0–46.0)
Hemoglobin: 10.6 g/dL — ABNORMAL LOW (ref 12.0–15.0)
Immature Granulocytes: 0 %
Lymphocytes Relative: 11 %
Lymphs Abs: 1.3 10*3/uL (ref 0.7–4.0)
MCH: 20.2 pg — ABNORMAL LOW (ref 26.0–34.0)
MCHC: 31.1 g/dL (ref 30.0–36.0)
MCV: 65 fL — ABNORMAL LOW (ref 80.0–100.0)
Monocytes Absolute: 1.2 10*3/uL — ABNORMAL HIGH (ref 0.1–1.0)
Monocytes Relative: 11 %
Neutro Abs: 8.7 10*3/uL — ABNORMAL HIGH (ref 1.7–7.7)
Neutrophils Relative %: 77 %
Platelets: 198 10*3/uL (ref 150–400)
RBC: 5.25 MIL/uL — ABNORMAL HIGH (ref 3.87–5.11)
RDW: 21.9 % — ABNORMAL HIGH (ref 11.5–15.5)
Smear Review: NORMAL
WBC: 11.2 10*3/uL — ABNORMAL HIGH (ref 4.0–10.5)
nRBC: 0 % (ref 0.0–0.2)

## 2020-02-24 LAB — PATHOLOGIST SMEAR REVIEW

## 2020-02-24 LAB — URINALYSIS, COMPLETE (UACMP) WITH MICROSCOPIC
Bilirubin Urine: NEGATIVE
Glucose, UA: NEGATIVE mg/dL
Hgb urine dipstick: NEGATIVE
Ketones, ur: NEGATIVE mg/dL
Nitrite: NEGATIVE
Protein, ur: NEGATIVE mg/dL
Specific Gravity, Urine: 1.014 (ref 1.005–1.030)
Squamous Epithelial / HPF: NONE SEEN (ref 0–5)
pH: 7 (ref 5.0–8.0)

## 2020-02-24 LAB — URINE DRUG SCREEN, QUALITATIVE (ARMC ONLY)
Amphetamines, Ur Screen: NOT DETECTED
Barbiturates, Ur Screen: NOT DETECTED
Benzodiazepine, Ur Scrn: NOT DETECTED
Cannabinoid 50 Ng, Ur ~~LOC~~: NOT DETECTED
Cocaine Metabolite,Ur ~~LOC~~: NOT DETECTED
MDMA (Ecstasy)Ur Screen: NOT DETECTED
Methadone Scn, Ur: NOT DETECTED
Opiate, Ur Screen: NOT DETECTED
Phencyclidine (PCP) Ur S: NOT DETECTED
Tricyclic, Ur Screen: NOT DETECTED

## 2020-02-24 LAB — GLUCOSE, CAPILLARY: Glucose-Capillary: 97 mg/dL (ref 70–99)

## 2020-02-24 LAB — TROPONIN I (HIGH SENSITIVITY)
Troponin I (High Sensitivity): 8 ng/L (ref <18)
Troponin I (High Sensitivity): 11 ng/L (ref <18)

## 2020-02-24 LAB — VALPROIC ACID LEVEL: Valproic Acid Lvl: 59 ug/mL (ref 50.0–100.0)

## 2020-02-24 LAB — MAGNESIUM: Magnesium: 1.6 mg/dL — ABNORMAL LOW (ref 1.7–2.4)

## 2020-02-24 LAB — SARS CORONAVIRUS 2 BY RT PCR (HOSPITAL ORDER, PERFORMED IN ~~LOC~~ HOSPITAL LAB): SARS Coronavirus 2: NEGATIVE

## 2020-02-24 MED ORDER — MAGNESIUM SULFATE IN D5W 1-5 GM/100ML-% IV SOLN
1.0000 g | Freq: Once | INTRAVENOUS | Status: AC
  Administered 2020-02-25: 1 g via INTRAVENOUS
  Filled 2020-02-24: qty 100

## 2020-02-24 MED ORDER — POTASSIUM CHLORIDE CRYS ER 20 MEQ PO TBCR
40.0000 meq | EXTENDED_RELEASE_TABLET | Freq: Once | ORAL | Status: AC
  Administered 2020-02-24: 40 meq via ORAL
  Filled 2020-02-24: qty 2

## 2020-02-24 MED ORDER — ACETAMINOPHEN 160 MG/5ML PO SOLN
650.0000 mg | ORAL | Status: DC | PRN
  Filled 2020-02-24: qty 20.3

## 2020-02-24 MED ORDER — INSULIN DETEMIR 100 UNIT/ML ~~LOC~~ SOLN
35.0000 [IU] | Freq: Every day | SUBCUTANEOUS | Status: DC
  Administered 2020-02-25 – 2020-03-03 (×8): 35 [IU] via SUBCUTANEOUS
  Filled 2020-02-24 (×8): qty 0.35

## 2020-02-24 MED ORDER — DIVALPROEX SODIUM 250 MG PO DR TAB
250.0000 mg | DELAYED_RELEASE_TABLET | Freq: Two times a day (BID) | ORAL | Status: DC
  Administered 2020-02-25: 250 mg via ORAL
  Administered 2020-02-25: 500 mg via ORAL
  Filled 2020-02-24: qty 1
  Filled 2020-02-24 (×2): qty 2

## 2020-02-24 MED ORDER — ACETAMINOPHEN 650 MG RE SUPP: 650.0000 mg | RECTAL | Status: DC | PRN

## 2020-02-24 MED ORDER — STROKE: EARLY STAGES OF RECOVERY BOOK: Freq: Once | Status: AC

## 2020-02-24 MED ORDER — INSULIN ASPART 100 UNIT/ML ~~LOC~~ SOLN
0.0000 [IU] | SUBCUTANEOUS | Status: DC
  Administered 2020-02-25 (×3): 2 [IU] via SUBCUTANEOUS
  Administered 2020-02-25: 3 [IU] via SUBCUTANEOUS
  Administered 2020-02-26 (×4): 2 [IU] via SUBCUTANEOUS
  Administered 2020-02-26: 3 [IU] via SUBCUTANEOUS
  Administered 2020-02-26: 2 [IU] via SUBCUTANEOUS
  Administered 2020-02-27 (×3): 1 [IU] via SUBCUTANEOUS
  Administered 2020-02-27: 21:00:00 2 [IU] via SUBCUTANEOUS
  Administered 2020-02-27: 1 [IU] via SUBCUTANEOUS
  Administered 2020-02-27: 2 [IU] via SUBCUTANEOUS
  Administered 2020-02-28: 5 [IU] via SUBCUTANEOUS
  Administered 2020-02-28: 3 [IU] via SUBCUTANEOUS
  Administered 2020-02-28: 05:00:00 2 [IU] via SUBCUTANEOUS
  Administered 2020-02-28 (×3): 3 [IU] via SUBCUTANEOUS
  Administered 2020-02-29: 18:00:00 5 [IU] via SUBCUTANEOUS
  Administered 2020-02-29 (×2): 2 [IU] via SUBCUTANEOUS
  Administered 2020-02-29: 13:00:00 3 [IU] via SUBCUTANEOUS
  Administered 2020-02-29: 2 [IU] via SUBCUTANEOUS
  Administered 2020-02-29: 5 [IU] via SUBCUTANEOUS
  Administered 2020-02-29: 3 [IU] via SUBCUTANEOUS
  Administered 2020-03-01: 2 [IU] via SUBCUTANEOUS
  Administered 2020-03-01: 04:00:00 1 [IU] via SUBCUTANEOUS
  Administered 2020-03-01: 2 [IU] via SUBCUTANEOUS
  Administered 2020-03-01: 08:00:00 1 [IU] via SUBCUTANEOUS
  Administered 2020-03-01: 3 [IU] via SUBCUTANEOUS
  Administered 2020-03-01: 5 [IU] via SUBCUTANEOUS
  Administered 2020-03-02 (×2): 1 [IU] via SUBCUTANEOUS
  Administered 2020-03-02: 3 [IU] via SUBCUTANEOUS
  Administered 2020-03-02: 18:00:00 2 [IU] via SUBCUTANEOUS
  Administered 2020-03-02: 22:00:00 3 [IU] via SUBCUTANEOUS
  Administered 2020-03-03: 05:00:00 1 [IU] via SUBCUTANEOUS
  Administered 2020-03-03: 01:00:00 3 [IU] via SUBCUTANEOUS
  Administered 2020-03-03: 1 [IU] via SUBCUTANEOUS
  Filled 2020-02-24 (×43): qty 1

## 2020-02-24 MED ORDER — ATORVASTATIN CALCIUM 20 MG PO TABS
40.0000 mg | ORAL_TABLET | Freq: Every day | ORAL | Status: DC
  Administered 2020-02-26 – 2020-03-02 (×6): 40 mg via ORAL
  Filled 2020-02-24 (×6): qty 2

## 2020-02-24 MED ORDER — SODIUM CHLORIDE 0.9 % IV SOLN: INTRAVENOUS | Status: DC

## 2020-02-24 MED ORDER — ASPIRIN EC 325 MG PO TBEC
325.0000 mg | DELAYED_RELEASE_TABLET | Freq: Every day | ORAL | Status: DC
  Administered 2020-02-25 – 2020-02-26 (×2): 325 mg via ORAL
  Filled 2020-02-24 (×2): qty 1

## 2020-02-24 MED ORDER — ACETAMINOPHEN 325 MG PO TABS
650.0000 mg | ORAL_TABLET | ORAL | Status: DC | PRN
  Administered 2020-03-01 – 2020-03-02 (×2): 650 mg via ORAL
  Filled 2020-02-24 (×2): qty 2

## 2020-02-24 MED ORDER — GABAPENTIN 300 MG PO CAPS
300.0000 mg | ORAL_CAPSULE | Freq: Two times a day (BID) | ORAL | Status: DC
  Administered 2020-02-25: 600 mg via ORAL
  Administered 2020-02-25: 300 mg via ORAL
  Filled 2020-02-24: qty 2
  Filled 2020-02-24: qty 1
  Filled 2020-02-24: qty 2

## 2020-02-24 NOTE — ED Notes (Signed)
Pt cooperative with in out cath, urine sent.

## 2020-02-24 NOTE — ED Notes (Signed)
Patient A&Ox4 with family present at bedside. Denies pain but reports nausea, declines medication at this time. NAD noted. Call bell in reach.

## 2020-02-24 NOTE — ED Provider Notes (Signed)
5:51 PM Assumed care for off going team.   Blood pressure (!) 164/108, pulse 98, temperature 98.9 F (37.2 C), temperature source Oral, resp. rate (!) 25, height 5\' 2"  (1.575 m), weight 127 kg, SpO2 99 %.  See their HPI for full report but in brief  S/p stroke-weak and sleepy difficulty walking, had similar symptoms in setting of UTI. She had left facial droop, left sided weakness this morning that is now resolved. Diffuse weakness now, cant pick up legs off the bed. Pending CT, labs, UA    UA no signs of UTI.  CT stable old infarcts.  6:06 PM Reevaluated patient no new neuro deficits at this time.  Discussed with patient on admission for [name].    , MD 02/24/20 [id number]

## 2020-02-24 NOTE — H&P (Signed)
[name] ZOX:[id number] DOB: December 19, 1965 DOA: 02/24/2020     PCP: [facility]   Outpatient Specialists:  [facility]   Patient arrived to ER on 02/24/20 at 1405  Patient coming from: home Lives  With family    Chief Complaint:  Chief Complaint  Patient presents with  . Weakness    HPI: [name] is a 54 y.o. female with medical history significant of depression, diabetes, epilepsy, hypertension, hyperlipidemia, history of prior stroke affecting left side    Presented with   Fatigue and weakness for the past 1 wk Family reported facial droop and slurred speech with LUE weakness At noon pt was unable to ambulate Her family seen her normal lat night prior to bed.  When husband try to wake off a small around noon noticing neurological symptoms By arrival to Er symptoms improved   Infectious risk factors:  Reports cough   Has  NOt been vaccinated against COVID   In  ER   COVID TEST  NEGATIVE   Lab Results  Component Value Date   SARSCOV2NAA NEGATIVE 02/24/2020   SARSCOV2NAA NEGATIVE 12/21/2019   SARSCOV2NAA NEGATIVE 07/23/2019   SARSCOV2NAA NEGATIVE 04/16/2019     Regarding pertinent Chronic problems:     Hyperlipidemia -  on statins Lipitor   HTN on lisinopril hydrochlorothiazide metoprolol   DM 2 -  Lab Results  Component Value Date   HGBA1C 7.9 (H) 12/20/2019   on insulin,      Morbid obesity-   BMI Readings from Last 1 Encounters:  02/24/20 51.21 kg/m        Hx of CVA - with  residual deficits on Aspirin 81 mg,      While in ER: CT head unremarkable  UA showing no signs of UTI    Hospitalist was called for admission for [name]  The following Work up has been ordered so far:  Orders Placed This Encounter  Procedures  . SARS Coronavirus 2 by RT PCR (hospital order, performed in [facility] hospital lab) Nasopharyngeal Nasopharyngeal Swab  . CT Head Wo Contrast  . DG Chest Portable 1 View  . Comprehensive  metabolic panel  . CBC with Differential  . CBC with Differential/Platelet  . Urinalysis, Complete w Microscopic  . Urine Drug Screen, Qualitative ([facility] only)  . Pathologist smear review  . Valproic acid level  . Cardiac monitoring  . Consult to hospitalist  ALL PATIENTS BEING ADMITTED/HAVING PROCEDURES NEED COVID-19 SCREENING  . EKG 12-Lead  . ED EKG    Following Medications were ordered in ER: Medications - No data to display      Consult Orders  (From admission, onward)         Start     Ordered   02/24/20 1805  Consult to hospitalist  ALL PATIENTS BEING ADMITTED/HAVING PROCEDURES NEED COVID-19 SCREENING  Once    Comments: ALL PATIENTS BEING ADMITTED/HAVING PROCEDURES NEED COVID-19 SCREENING  Provider:  (Not yet assigned)  Question Answer Comment  Place call to: [phone]   Reason for Consult Admit      02/24/20 1805          Significant initial  Findings: Abnormal Labs Reviewed  COMPREHENSIVE METABOLIC PANEL - Abnormal; Notable for the following components:      Result Value   Potassium 3.3 (*)    Glucose, Bld 148 (*)    BUN 23 (*)    Creatinine, Ser 1.16 (*)    Calcium 8.8 (*)  Alkaline Phosphatase 34 (*)    GFR calc non Af Amer 53 (*)    All other components within normal limits  CBC WITH DIFFERENTIAL/PLATELET - Abnormal; Notable for the following components:   WBC 11.2 (*)    RBC 5.25 (*)    Hemoglobin 10.6 (*)    HCT 34.1 (*)    MCV 65.0 (*)    MCH 20.2 (*)    RDW 21.9 (*)    Neutro Abs 8.7 (*)    Monocytes Absolute 1.2 (*)    All other components within normal limits  URINALYSIS, COMPLETE (UACMP) WITH MICROSCOPIC - Abnormal; Notable for the following components:   Color, Urine YELLOW (*)    APPearance HAZY (*)    Leukocytes,Ua TRACE (*)    Bacteria, UA RARE (*)    All other components within normal limits    Otherwise labs showing:    Recent Labs  Lab 02/24/20 1450  NA 139  K 3.3*  CO2 25  GLUCOSE 148*  BUN 23*  CREATININE 1.16*    CALCIUM 8.8*    Cr Up from baseline see below Lab Results  Component Value Date   CREATININE 1.16 (H) 02/24/2020   CREATININE 0.90 12/22/2019   CREATININE 1.63 (H) 12/20/2019    Recent Labs  Lab 02/24/20 1450  AST 15  ALT 13  ALKPHOS 34*  BILITOT 1.1  PROT 7.1  ALBUMIN 3.8   Lab Results  Component Value Date   CALCIUM 8.8 (L) 02/24/2020    WBC       Component Value Date/Time   WBC 11.2 (H) 02/24/2020 1537   ANC    Component Value Date/Time   NEUTROABS 8.7 (H) 02/24/2020 1537   NEUTROABS 5.0 12/28/2014 0458   ALC No components found for: LYMPHAB   Plt: Lab Results  Component Value Date   PLT 198 02/24/2020     COVID-19 Labs  No results for input(s): DDIMER, FERRITIN, LDH, CRP in the last 72 hours.  Lab Results  Component Value Date   SARSCOV2NAA NEGATIVE 12/21/2019   SARSCOV2NAA NEGATIVE 07/23/2019   SARSCOV2NAA NEGATIVE 04/16/2019   HG/HCT   Stable,     Component Value Date/Time   HGB 10.6 (L) 02/24/2020 1537   HGB 10.6 (L) 12/28/2014 0458   HCT 34.1 (L) 02/24/2020 1537   HCT 33.5 (L) 12/28/2014 0458    No results for input(s): LIPASE, AMYLASE in the last 168 hours. No results for input(s): AMMONIA in the last 168 hours.  No components found for: LABALBU   Troponin 8   ECG: Ordered Personally reviewed by me showing: HR :97 Rhythm: NSR   no evidence of ischemic changes QTC 427   BNP (last 3 results) Recent Labs    07/23/19 1128  BNP 34.0    ProBNP (last 3 results) No results for input(s): PROBNP in the last 8760 hours.  DM  labs:  HbA1C: Recent Labs    04/17/19 0457 07/24/19 0241 12/20/19 2027  HGBA1C 11.3* 9.1* 7.9*       CBG (last 3)  No results for input(s): GLUCAP in the last 72 hours.     UA   no evidence of UTI    Urine analysis:    Component Value Date/Time   COLORURINE YELLOW (A) 02/24/2020 1645   APPEARANCEUR HAZY (A) 02/24/2020 1645   APPEARANCEUR Clear 12/27/2014 1806   LABSPEC 1.014 02/24/2020  1645   LABSPEC 1.035 12/27/2014 1806   PHURINE 7.0 02/24/2020 1645   GLUCOSEU NEGATIVE 02/24/2020  1645   GLUCOSEU >=500 12/27/2014 1806   HGBUR NEGATIVE 02/24/2020 1645   BILIRUBINUR NEGATIVE 02/24/2020 1645   BILIRUBINUR Negative 12/27/2014 1806   KETONESUR NEGATIVE 02/24/2020 1645   PROTEINUR NEGATIVE 02/24/2020 1645   NITRITE NEGATIVE 02/24/2020 1645   LEUKOCYTESUR TRACE (A) 02/24/2020 1645   LEUKOCYTESUR Negative 12/27/2014 1806       Ordered  CT HEAD  NON acute old CVA  CXR -  NON acute  MRI - ordered    ED Triage Vitals  Enc Vitals Group     BP 02/24/20 1418 (!) 149/78     Pulse Rate 02/24/20 1418 100     Resp 02/24/20 1418 14     Temp 02/24/20 1418 98.9 F (37.2 C)     Temp Source 02/24/20 1418 Oral     SpO2 02/24/20 1418 94 %     Weight 02/24/20 1420 280 lb (127 kg)     Height 02/24/20 1420 5\' 2"  (1.575 m)     Head Circumference --      Peak Flow --      Pain Score 02/24/20 1419 0     Pain Loc --      Pain Edu? --      Excl. in GC? --   TMAX(24)@       Latest  Blood pressure (!) 164/108, pulse 98, temperature 98.9 F (37.2 C), temperature source Oral, resp. rate (!) 25, height 5\' 2"  (1.575 m), weight 127 kg, SpO2 99 %.      Review of Systems:    Pertinent positives include:  gait abnormality, slurred speech  confusion weakness Constitutional:  No weight loss, night sweats, Fevers, chills, fatigue, weight loss  HEENT:  No headaches, Difficulty swallowing,Tooth/dental problems,Sore throat,  No sneezing, itching, ear ache, nasal congestion, post nasal drip,  Cardio-vascular:  No chest pain, Orthopnea, PND, anasarca, dizziness, palpitations.no Bilateral lower extremity swelling  GI:  No heartburn, indigestion, abdominal pain, nausea, vomiting, diarrhea, change in bowel habits, loss of appetite, melena, blood in stool, hematemesis Resp:  no shortness of breath at rest. No dyspnea on exertion, No excess mucus, no productive cough, No non-productive cough,  No coughing up of blood.No change in color of mucus.No wheezing. Skin:  no rash or lesions. No jaundice GU:  no dysuria, change in color of urine, no urgency or frequency. No straining to urinate.  No flank pain.  Musculoskeletal:  No joint pain or no joint swelling. No decreased range of motion. No back pain.  Psych:  No change in mood or affect. No depression or anxiety. No memory loss.  Neuro: no localizing neurological complaints, no tingling, no ,   All systems reviewed and apart from HOPI all are negative  Past Medical History:   Past Medical History:  Diagnosis Date  . Allergic rhinitis   . Chickenpox   . Depression   . Diabetes (HCC)   . Epilepsy (HCC)   . Headache   . HTN (hypertension)   . Hyperlipidemia   . Seizures (HCC)   . Stroke (HCC)   . Urinary incontinence      Past Surgical History:  Procedure Laterality Date  . CESAREAN SECTION    . INCISION AND DRAINAGE Right 05/13/2016   Procedure: INCISION AND DRAINAGE;  Surgeon: Gwyneth RevelsJustin Fowler, DPM;  Location: [facility] ORS;  Service: Podiatry;  Laterality: Right;    Social History:  Ambulatory   Dan HumphreysWalker      reports that she has never smoked. She has never used  smokeless tobacco. She reports that she does not drink alcohol or use drugs.   Family History:   Family History  Problem Relation Age of Onset  . Hypertension Mother   . Diabetes Mellitus II Mother   . Hypertension Father   . Pancreatic cancer Father     Allergies: No Known Allergies   Prior to Admission medications   Medication Sig Start Date End Date Taking? Authorizing Provider  aspirin EC 325 MG EC tablet Take 1 tablet (325 mg total) by mouth daily. 07/26/19   Lorretta Harp, MD  atorvastatin (LIPITOR) 40 MG tablet Take 1 tablet (40 mg total) by mouth daily at 6 PM. 08/16/16   Ghimire, Werner Lean, MD  divalproex (DEPAKOTE) 250 MG DR tablet Take 250-500 mg by mouth 2 (two) times daily. Take one tablet (250 mg) by mouth every morning and two tablets  (500 mg) at bedtime 07/13/19   [provider]  gabapentin (NEURONTIN) 300 MG capsule Take 300-900 mg by mouth 2 (two) times daily. Take one capsule (300 mg) by mouth every morning and two capsules (600 mg) at bedtime. (May take an additional 300 mg at bedtime as needed) 07/13/19   [provider]  glipiZIDE (GLUCOTROL XL) 5 MG 24 hr tablet Take 1 tablet (5 mg total) by mouth daily. 08/16/16   Ghimire, Werner Lean, MD  insulin aspart (NOVOLOG FLEXPEN) 100 UNIT/ML FlexPen Inject 10 Units into the skin 3 (three) times daily with meals. Patient taking differently: Inject 15 Units into the skin 3 (three) times daily with meals.  08/16/16   Ghimire, Werner Lean, MD  Insulin Detemir (LEVEMIR FLEXPEN) 100 UNIT/ML Pen Inject 40 Units into the skin daily at 10 pm. Patient taking differently: Inject 35 Units into the skin daily at 10 pm.  08/16/16   Ghimire, Werner Lean, MD  lisinopril-hydrochlorothiazide (PRINZIDE,ZESTORETIC) 20-12.5 MG tablet Take 1 tablet by mouth daily. 08/16/16   Ghimire, Werner Lean, MD  metFORMIN (GLUCOPHAGE-XR) 500 MG 24 hr tablet Take 500 mg by mouth 2 (two) times daily. 08/23/19   [provider]  metoprolol tartrate (LOPRESSOR) 25 MG tablet Take 25 mg by mouth 2 (two) times a day.    [provider]  saccharomyces boulardii (FLORASTOR) 250 MG capsule Take 250 mg by mouth 2 (two) times daily.    [provider]  VESICARE 10 MG tablet Take 10 mg by mouth 2 (two) times daily. 05/26/19   [provider]   Physical Exam: Blood pressure (!) 164/108, pulse 98, temperature 98.9 F (37.2 C), temperature source Oral, resp. rate (!) 25, height 5\' 2"  (1.575 m), weight 127 kg, SpO2 99 %. 1. General:  in No  Acute distress    Chronically ill -appearing 2. Psychological: Alert and  Oriented 3. Head/ENT:    Dry Mucous Membranes                          Head Non traumatic, neck supple                            Poor Dentition 4. SKIN:   decreased Skin  turgor,  Skin clean Dry and intact no rash 5. Heart: Regular rate and rhythm no  Murmur, no Rub or gallop 6. Lungs:   Clear to auscultation bilaterally, no wheezes or crackles   7. Abdomen: Soft,  non-tender, Non distended   obese  bowel sounds present 8. Lower  extremities: no clubbing, cyanosis, no edema 9. Neurologically mild left sided weakness and disconjugate gaze noted 10. MSK: Normal range of motion   All other LABS:     Recent Labs  Lab 02/24/20 1537  WBC 11.2*  NEUTROABS 8.7*  HGB 10.6*  HCT 34.1*  MCV 65.0*  PLT 198     Recent Labs  Lab 02/24/20 1450  NA 139  K 3.3*  CL 103  CO2 25  GLUCOSE 148*  BUN 23*  CREATININE 1.16*  CALCIUM 8.8*     Recent Labs  Lab 02/24/20 1450  AST 15  ALT 13  ALKPHOS 34*  BILITOT 1.1  PROT 7.1  ALBUMIN 3.8       Cultures:    Component Value Date/Time   SDES  12/21/2019 0224    URINE, RANDOM Performed at [facility], [address] [place], [place] [address]    [facility]  12/21/2019 0224    NONE Performed at [facility], [address] [place], [place] [address]    CULT (A) 12/21/2019 0224    >=100,000 COLONIES/mL GRANULICATELLA ADIACENS Standardized susceptibility testing for this organism is not available. Performed at [facility], [address], [place], [place] [address]    REPTSTATUS 12/24/2019 FINAL 12/21/2019 0224     Radiological Exams on Admission: CT Head Wo Contrast  Result Date: 02/24/2020 CLINICAL DATA:  Ataxia. Stroke suspected episode of left-sided weakness greater than baseline which has since resolved. EXAM: CT HEAD WITHOUT CONTRAST TECHNIQUE: Contiguous axial images were obtained from the base of the skull through the vertex without intravenous contrast. COMPARISON:  CT head without contrast 12/20/2019 and MR head without contrast 12/21/2019. FINDINGS: Brain: The remote right MCA territory infarct is stable. Ex vacuo dilation of the right lateral ventricle is  again noted. Remote ischemic changes are present in the internal capsule. Mild periventricular white matter changes on the left are stable. A remote lacunar infarct in the left paramedian pons is stable. Brainstem and cerebellum are otherwise normal. Vascular: Atherosclerotic changes are again noted within the cavernous internal carotid arteries bilaterally. No hyperdense vessel is present. Skull: Calvarium is intact. No focal lytic or blastic lesions are present. No significant extracranial soft tissue lesion is present. Sinuses/Orbits: The paranasal sinuses and mastoid air cells are clear. The globes and orbits are within normal limits. IMPRESSION: 1. No acute intracranial abnormality or significant interval change. 2. Stable remote right MCA territory infarct. 3. Remote lacunar infarct in the left paramedian pons. 4. Atherosclerosis. Electronically Signed   By: Marin Roberts M.D.   On: 02/24/2020 15:29   MR ANGIO HEAD WO CONTRAST  Result Date: 02/24/2020 CLINICAL DATA:  54 year old female with left side weakness, ataxia. EXAM: MRA HEAD WITHOUT CONTRAST TECHNIQUE: Angiographic images of the Circle of Willis were obtained using MRA technique without intravenous contrast. COMPARISON:  Head CT earlier today. Intracranial MRA 07/24/2019. Brain MRI 12/21/2019. FINDINGS: Study is moderately degraded by motion artifact despite repeated imaging attempts, similar to the November comparison. Antegrade flow in the posterior circulation appears stable with dominant left vertebral artery. The right vertebral probably functionally terminates in PICA. Obscured proximal basilar artery today, but the distal basilar is patent. Basilar tip and PCA origins remain patent. Degraded PCA branch detail, PCAs appear grossly stable since November. Antegrade flow in both ICA siphons, with obscured petrous ICA segments today. Partially obscured supraclinoid segments. Bilateral carotid termini remain patent. Bilateral M1 and A1  segments remain patent. Better visualization of the bilateral ACA  branches today which appear normal. MCA branch detail is obscured similar to the prior exam but there is probably asymmetric right MCA stenosis in the setting of chronic right MCA territory ischemia. The left M1 seems stable and within normal limits. IMPRESSION: 1. Motion degraded intracranial MRA similar to that in November. Follow-up Head And Neck CTA with contrast would be valuable in light of inability to tolerate MRA. 2. No emergent large vessel occlusion suspected. 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 Electronically Signed   By: Odessa Fleming M.D.   On: 02/24/2020 22:25   DG Chest Portable 1 View  Result Date: 02/24/2020 CLINICAL DATA:  Weakness, facial droop, slurred speech EXAM: PORTABLE CHEST 1 VIEW COMPARISON:  12/20/2019 FINDINGS: Single frontal view of the chest demonstrates an unremarkable cardiac silhouette. There is increased density in the retrocardiac region which may reflect atelectasis or airspace disease. No effusion or pneumothorax. No acute bony abnormalities. IMPRESSION: 1. New retrocardiac consolidation compatible with airspace disease or atelectasis. Electronically Signed   By: Sharlet Salina M.D.   On: 02/24/2020 17:24    Chart has been reviewed    Assessment/Plan   54 y.o. female with medical history significant of depression, diabetes, epilepsy, hypertension, hyperlipidemia, history of prior stroke affecting left side     Admitted for [name]  Present on Admission: . [name] (transient ischemic attack) -  - will admit based on [name]/CVA protocol,         Monitor on Tele      MRI/MRA await results  Carotid Doppler ordered       Echo to evaluate for possible embolic source,           ECG,   Lipid panel, TSH.        Order PT/OT evaluation.        keep nothing by mouth until passes swallow eval        Will make sure patient is on antiplatelet ASA  325  agent  and statin        Allow permissive Hypertension keep BP <220/120        Neurology consult  in AM  . HTN (hypertension) -allow permissive hypertension for tonight  . Hyperlipidemia -continue Lipitor  . Severe obesity (BMI >= 40) (HCC) follow-up with nutrition as an outpatient  . AKI (acute kidney injury) (HCC) -we will gently rehydrate  . Type 2 diabetes mellitus with hyperlipidemia (HCC) -  - Order Sensitive SSI   - continue home insulin regimen   -  check TSH and HgA1C  - Hold by mouth medications   Hypokalemia - will replace and check mag  Epilepsy - on Depakote last seizure was 2 months ago Valproic acid level 59   Anemia - chronic, abnormal cells present including few schistocytes of unclear significance    Other plan as per orders.  DVT prophylaxis:  SCD   Code Status:  FULL CODE   as per patient  I had personally discussed CODE STATUS with patient   Family Communication:   Family not at  Bedside  plan of care was discussed   with   Husband  Disposition Plan:   To home once workup is complete and patient is stable   Following barriers for discharge:                            Electrolytes corrected  Will likely need home health,                             Will need consultants to evaluate patient prior to discharge   EXPECT DC tomorrow                    Would benefit from PT/OT eval prior to DC  Ordered                   Swallow eval - SLP ordered                                      Consults called:  none  Need neurology in AM  Admission status:  ED Disposition    None        Obs      Level of care     Tele indefinitely please discontinue once patient no longer qualifies   Precautions: admitted as  Covid Negative    PPE: Used by the provider:  N95 eye Goggles,  Gloves      Zamiya Dillard 02/24/2020, 10:35 PM    [facility]     after 2 AM please page floor coverage PA If  7AM-7PM, please contact the day team taking care of the patient using [email or web address]   Patient was evaluated in the context of the global COVID-19 pandemic, which necessitated consideration that the patient might be at risk for infection with the SARS-CoV-2 virus that causes COVID-19. Institutional protocols and algorithms that pertain to the evaluation of patients at risk for COVID-19 are in a state of rapid change based on information released by regulatory bodies including the CDC and federal and state organizations. These policies and algorithms were followed during the patient's care.

## 2020-02-24 NOTE — ED Provider Notes (Signed)
[facility] Emergency Department Provider Note   ____________________________________________   First MD Initiated Contact with Patient 02/24/20 1426     (approximate)  I have reviewed the triage vital signs and the nursing notes.   HISTORY  Chief Complaint Weakness    HPI [name] is a 54 y.o. female who had a stroke about a year ago.  She has been very weak and sleepy the last week.  Last time this happened was when she had a UTI.  This morning when her husband went to get her up he found that she had facial droop and left-sided weakness.  This is since gone away.  She is not having the slurry speech she had this morning anymore.  She is still diffusely weak and tired though.  Nothing is currently hurting.         Past Medical History:  Diagnosis Date  . Allergic rhinitis   . Chickenpox   . Depression   . Diabetes ([place])   . Epilepsy ([place])   . Headache   . HTN (hypertension)   . Hyperlipidemia   . Seizures ([place])   . Stroke ([place])   . Urinary incontinence     Patient Active Problem List   Diagnosis Date Noted  . Generalized weakness 12/20/2019  . Hemiparesis affecting left side as late effect of cerebrovascular accident (CVA) ([place]) 12/20/2019  . Type 2 diabetes mellitus with hyperlipidemia ([place]) 12/20/2019  . Seizure disorder as sequela of cerebrovascular accident ([place]) 12/20/2019  . Urinary incontinence 12/20/2019  . Self-care deficit 12/20/2019  . Fall   . Lacunar stroke, acute ([place]) 07/24/2019  . Seizure ([place]) 07/24/2019  . Elevated troponin 07/24/2019  . AKI (acute kidney injury) ([place]) 07/24/2019  . Acute metabolic encephalopathy 76/73/4193  . Cerebral infarction ([place]) 08/14/2016  . Recurrent cerebrovascular accidents (CVAs) ([place]) 08/14/2016  . Delirium due to another medical condition 08/13/2016  . Urinary tract infection 08/13/2016  . Right foot infection 05/11/2016  . Foot abscess, right 05/11/2016  . Anxiety and depression  12/02/2015  . CVA (cerebral infarction) 11/27/2015  . Epilepsy ([place]) 06/17/2015  . Complicated migraine 79/10/4095  . Headache 05/19/2015  . Weakness of left side of body 05/19/2015  . History of stroke 05/19/2015  . Severe obesity (BMI >= 40) ([place]) 03/19/2014  . HTN (hypertension)   . Diabetes mellitus without complication ([place])   . Hyperlipidemia   . [name] (transient ischemic attack) 03/17/2014    Past Surgical History:  Procedure Laterality Date  . CESAREAN SECTION    . INCISION AND DRAINAGE Right 05/13/2016   Procedure: INCISION AND DRAINAGE;  Surgeon: Samara Deist, DPM;  Location: [facility] ORS;  Service: Podiatry;  Laterality: Right;    Prior to Admission medications   Medication Sig Start Date End Date Taking? Authorizing Provider  aspirin EC 325 MG EC tablet Take 1 tablet (325 mg total) by mouth daily. 07/26/19   Ivor Costa, MD  atorvastatin (LIPITOR) 40 MG tablet Take 1 tablet (40 mg total) by mouth daily at 6 PM. 08/16/16   Ghimire, Henreitta Leber, MD  divalproex (DEPAKOTE) 250 MG DR tablet Take 250-500 mg by mouth 2 (two) times daily. Take one tablet (250 mg) by mouth every morning and two tablets (500 mg) at bedtime 07/13/19   [provider]  gabapentin (NEURONTIN) 300 MG capsule Take 300-900 mg by mouth 2 (two) times daily. Take one capsule (300 mg) by mouth every morning and two capsules (600 mg) at bedtime. (May take  an additional 300 mg at bedtime as needed) 07/13/19   [provider]  glipiZIDE (GLUCOTROL XL) 5 MG 24 hr tablet Take 1 tablet (5 mg total) by mouth daily. 08/16/16   Ghimire, Werner Lean, MD  insulin aspart (NOVOLOG FLEXPEN) 100 UNIT/ML FlexPen Inject 10 Units into the skin 3 (three) times daily with meals. Patient taking differently: Inject 15 Units into the skin 3 (three) times daily with meals.  08/16/16   Ghimire, Werner Lean, MD  Insulin Detemir (LEVEMIR FLEXPEN) 100 UNIT/ML Pen Inject 40 Units into the skin daily at 10 pm. Patient taking  differently: Inject 35 Units into the skin daily at 10 pm.  08/16/16   Ghimire, Werner Lean, MD  lisinopril-hydrochlorothiazide (PRINZIDE,ZESTORETIC) 20-12.5 MG tablet Take 1 tablet by mouth daily. 08/16/16   Ghimire, Werner Lean, MD  metFORMIN (GLUCOPHAGE-XR) 500 MG 24 hr tablet Take 500 mg by mouth 2 (two) times daily. 08/23/19   [provider]  metoprolol tartrate (LOPRESSOR) 25 MG tablet Take 25 mg by mouth 2 (two) times a day.    [provider]  saccharomyces boulardii (FLORASTOR) 250 MG capsule Take 250 mg by mouth 2 (two) times daily.    [provider]  VESICARE 10 MG tablet Take 10 mg by mouth 2 (two) times daily. 05/26/19   [provider]    Allergies Patient has no known allergies.  Family History  Problem Relation Age of Onset  . Hypertension Mother   . Diabetes Mellitus II Mother   . Hypertension Father   . Pancreatic cancer Father     Social History Social History   Tobacco Use  . Smoking status: Never Smoker  . Smokeless tobacco: Never Used  Substance Use Topics  . Alcohol use: No    Alcohol/week: 0.0 standard drinks  . Drug use: No    Review of Systems  Constitutional: No fever/chills Eyes: No visual changes. ENT: No sore throat. Cardiovascular: Denies chest pain. Respiratory: Denies shortness of breath. Gastrointestinal: No abdominal pain.  No nausea, no vomiting.  No diarrhea.  No constipation. Genitourinary: Negative for dysuria. Musculoskeletal: Negative for back pain. Skin: Negative for rash. Neurological: Negative for headaches, focal weakness   ____________________________________________   PHYSICAL EXAM:  VITAL SIGNS: ED Triage Vitals  Enc Vitals Group     BP 02/24/20 1418 (!) 149/78     Pulse Rate 02/24/20 1418 100     Resp 02/24/20 1418 14     Temp 02/24/20 1418 98.9 F (37.2 C)     Temp Source 02/24/20 1418 Oral     SpO2 02/24/20 1418 94 %     Weight 02/24/20 1420 280 lb (127 kg)     Height 02/24/20  1420 5\' 2"  (1.575 m)     Head Circumference --      Peak Flow --      Pain Score 02/24/20 1419 0     Pain Loc --      Pain Edu? --      Excl. in GC? --     Constitutional: Drowsy but arousable completely oriented Eyes: Conjunctivae are normal. PER. EOMI. Head: Atraumatic. Nose: No congestion/rhinnorhea. Mouth/Throat: Mucous membranes are moist.  Oropharynx non-erythematous. Neck: No stridor. Cardiovascular: Normal rate, regular rhythm. Grossly normal heart sounds.  Good peripheral circulation. Respiratory: Normal respiratory effort.  No retractions. Lungs CTAB. Gastrointestinal: Soft and nontender. No distention. No abdominal bruits. No CVA tenderness. Musculoskeletal: No lower extremity tenderness slight edema.  Neurologic:  Normal speech and language. No  gross focal neurologic deficits are appreciated.  Diffusely weak however Skin:  Skin is warm, dry and intact. No rash noted.   ____________________________________________   LABS (all labs ordered are listed, but only abnormal results are displayed)  Labs Reviewed  COMPREHENSIVE METABOLIC PANEL - Abnormal; Notable for the following components:      Result Value   Potassium 3.3 (*)    Glucose, Bld 148 (*)    BUN 23 (*)    Creatinine, Ser 1.16 (*)    Calcium 8.8 (*)    Alkaline Phosphatase 34 (*)    GFR calc non Af Amer 53 (*)    All other components within normal limits  CBC WITH DIFFERENTIAL/PLATELET - Abnormal; Notable for the following components:   WBC 11.2 (*)    RBC 5.25 (*)    Hemoglobin 10.6 (*)    HCT 34.1 (*)    MCV 65.0 (*)    MCH 20.2 (*)    RDW 21.9 (*)    Neutro Abs 8.7 (*)    Monocytes Absolute 1.2 (*)    All other components within normal limits  URINALYSIS, COMPLETE (UACMP) WITH MICROSCOPIC - Abnormal; Notable for the following components:   Color, Urine YELLOW (*)    APPearance HAZY (*)    Leukocytes,Ua TRACE (*)    Bacteria, UA RARE (*)    All other components within normal limits  URINE  DRUG SCREEN, QUALITATIVE ([facility] ONLY)  PATHOLOGIST SMEAR REVIEW  VALPROIC ACID LEVEL  CBC WITH DIFFERENTIAL/PLATELET  TROPONIN I (HIGH SENSITIVITY)  TROPONIN I (HIGH SENSITIVITY)   ____________________________________________  EKG EKG read interpreted by me shows normal sinus rhythm 97 normal axis nonspecific ST-T wave changes ____________________________________________  RADIOLOGY  ED MD interpretation: Head CT read by radiology shows only old strokes  Official radiology report(s): CT Head Wo Contrast  Result Date: 02/24/2020 CLINICAL DATA:  Ataxia. Stroke suspected episode of left-sided weakness greater than baseline which has since resolved. EXAM: CT HEAD WITHOUT CONTRAST TECHNIQUE: Contiguous axial images were obtained from the base of the skull through the vertex without intravenous contrast. COMPARISON:  CT head without contrast 12/20/2019 and MR head without contrast 12/21/2019. FINDINGS: Brain: The remote right MCA territory infarct is stable. Ex vacuo dilation of the right lateral ventricle is again noted. Remote ischemic changes are present in the internal capsule. Mild periventricular white matter changes on the left are stable. A remote lacunar infarct in the left paramedian pons is stable. Brainstem and cerebellum are otherwise normal. Vascular: Atherosclerotic changes are again noted within the cavernous internal carotid arteries bilaterally. No hyperdense vessel is present. Skull: Calvarium is intact. No focal lytic or blastic lesions are present. No significant extracranial soft tissue lesion is present. Sinuses/Orbits: The paranasal sinuses and mastoid air cells are clear. The globes and orbits are within normal limits. IMPRESSION: 1. No acute intracranial abnormality or significant interval change. 2. Stable remote right MCA territory infarct. 3. Remote lacunar infarct in the left paramedian pons. 4. Atherosclerosis. Electronically Signed   By: Marin Roberts M.D.   On:  02/24/2020 15:29    ____________________________________________   PROCEDURES  Procedure(s) performed (including Critical Care):  Procedures   ____________________________________________   INITIAL IMPRESSION / ASSESSMENT AND PLAN / ED COURSE  Patient was somewhat altered mental status and apparently [name] this morning.  The [name] symptoms have resolved.  Still waiting on labs including urinalysis and a chest x-ray.  I will sign the patient out to  Oncoming physician.  ____________________________________________   FINAL CLINICAL IMPRESSION(S) / ED DIAGNOSES  Final diagnoses:  Weakness  [name] (transient ischemic attack)     ED Discharge Orders    None       Note:  This document was prepared using Dragon voice recognition software and may include unintentional dictation errors.     Arnaldo Natal, MD 02/24/20 [phone]

## 2020-02-24 NOTE — ED Notes (Signed)
Patient pulled out IV per husband.

## 2020-02-24 NOTE — ED Triage Notes (Signed)
BIBA from home for fatigue and weakness x1 week. Patient reports similar sx in past with UTI. Drowsy but wakes to voice, oriented, in NAD. VSS, FSBG 155 for EMS.

## 2020-02-24 NOTE — ED Triage Notes (Signed)
Triage addendum: When provider at bedside, family also reports that patient had facial droop, slurred speech, LUE weakness, and unable to ambulate when he woke patient up around noon today. Symptoms have mostly resolved at this time. LKN last night before bed.

## 2020-02-25 ENCOUNTER — Observation Stay: Payer: Medicaid Other

## 2020-02-25 ENCOUNTER — Observation Stay (HOSPITAL_COMMUNITY)
Admit: 2020-02-25 | Discharge: 2020-02-25 | Disposition: A | Discharge status: Home / Self Care | Payer: Medicaid Other | Attending: Internal Medicine | Admitting: Internal Medicine

## 2020-02-25 DIAGNOSIS — R531 Weakness: Secondary | ICD-10-CM | POA: Diagnosis not present

## 2020-02-25 DIAGNOSIS — G9341 Metabolic encephalopathy: Secondary | ICD-10-CM | POA: Diagnosis present

## 2020-02-25 DIAGNOSIS — I69354 Hemiplegia and hemiparesis following cerebral infarction affecting left non-dominant side: Secondary | ICD-10-CM | POA: Diagnosis not present

## 2020-02-25 DIAGNOSIS — G4733 Obstructive sleep apnea (adult) (pediatric): Secondary | ICD-10-CM | POA: Diagnosis present

## 2020-02-25 DIAGNOSIS — Z7902 Long term (current) use of antithrombotics/antiplatelets: Secondary | ICD-10-CM | POA: Diagnosis not present

## 2020-02-25 DIAGNOSIS — J181 Lobar pneumonia, unspecified organism: Secondary | ICD-10-CM | POA: Diagnosis present

## 2020-02-25 DIAGNOSIS — Z6841 Body Mass Index (BMI) 40.0 and over, adult: Secondary | ICD-10-CM | POA: Diagnosis not present

## 2020-02-25 DIAGNOSIS — Z8249 Family history of ischemic heart disease and other diseases of the circulatory system: Secondary | ICD-10-CM | POA: Diagnosis not present

## 2020-02-25 DIAGNOSIS — Z800 Family history of malignant neoplasm of digestive organs: Secondary | ICD-10-CM | POA: Diagnosis not present

## 2020-02-25 DIAGNOSIS — E119 Type 2 diabetes mellitus without complications: Secondary | ICD-10-CM | POA: Diagnosis not present

## 2020-02-25 DIAGNOSIS — I10 Essential (primary) hypertension: Secondary | ICD-10-CM | POA: Diagnosis present

## 2020-02-25 DIAGNOSIS — G459 Transient cerebral ischemic attack, unspecified: Secondary | ICD-10-CM

## 2020-02-25 DIAGNOSIS — E785 Hyperlipidemia, unspecified: Secondary | ICD-10-CM | POA: Diagnosis present

## 2020-02-25 DIAGNOSIS — G9389 Other specified disorders of brain: Secondary | ICD-10-CM | POA: Diagnosis present

## 2020-02-25 DIAGNOSIS — D509 Iron deficiency anemia, unspecified: Secondary | ICD-10-CM | POA: Diagnosis present

## 2020-02-25 DIAGNOSIS — R29810 Facial weakness: Secondary | ICD-10-CM | POA: Diagnosis present

## 2020-02-25 DIAGNOSIS — R4781 Slurred speech: Secondary | ICD-10-CM | POA: Diagnosis present

## 2020-02-25 DIAGNOSIS — G40909 Epilepsy, unspecified, not intractable, without status epilepticus: Secondary | ICD-10-CM | POA: Diagnosis present

## 2020-02-25 DIAGNOSIS — F329 Major depressive disorder, single episode, unspecified: Secondary | ICD-10-CM | POA: Diagnosis present

## 2020-02-25 DIAGNOSIS — Z20822 Contact with and (suspected) exposure to covid-19: Secondary | ICD-10-CM | POA: Diagnosis present

## 2020-02-25 DIAGNOSIS — E1169 Type 2 diabetes mellitus with other specified complication: Secondary | ICD-10-CM | POA: Diagnosis present

## 2020-02-25 DIAGNOSIS — Z23 Encounter for immunization: Secondary | ICD-10-CM | POA: Diagnosis not present

## 2020-02-25 DIAGNOSIS — N179 Acute kidney failure, unspecified: Secondary | ICD-10-CM | POA: Diagnosis present

## 2020-02-25 DIAGNOSIS — I472 Ventricular tachycardia: Secondary | ICD-10-CM | POA: Diagnosis present

## 2020-02-25 DIAGNOSIS — Z833 Family history of diabetes mellitus: Secondary | ICD-10-CM | POA: Diagnosis not present

## 2020-02-25 DIAGNOSIS — R0602 Shortness of breath: Secondary | ICD-10-CM | POA: Diagnosis not present

## 2020-02-25 DIAGNOSIS — J189 Pneumonia, unspecified organism: Secondary | ICD-10-CM | POA: Diagnosis not present

## 2020-02-25 DIAGNOSIS — E860 Dehydration: Secondary | ICD-10-CM | POA: Diagnosis present

## 2020-02-25 LAB — GLUCOSE, CAPILLARY
Glucose-Capillary: 160 mg/dL — ABNORMAL HIGH (ref 70–99)
Glucose-Capillary: 161 mg/dL — ABNORMAL HIGH (ref 70–99)
Glucose-Capillary: 168 mg/dL — ABNORMAL HIGH (ref 70–99)
Glucose-Capillary: 205 mg/dL — ABNORMAL HIGH (ref 70–99)
Glucose-Capillary: 172 mg/dL — ABNORMAL HIGH (ref 70–99)
Glucose-Capillary: 213 mg/dL — ABNORMAL HIGH (ref 70–99)

## 2020-02-25 LAB — HEMOGLOBIN A1C
Hgb A1c MFr Bld: 7.7 % — ABNORMAL HIGH (ref 4.8–5.6)
Mean Plasma Glucose: 174.29 mg/dL

## 2020-02-25 LAB — URINALYSIS, DIPSTICK ONLY
Bilirubin Urine: NEGATIVE
Glucose, UA: NEGATIVE mg/dL
Hgb urine dipstick: NEGATIVE
Ketones, ur: NEGATIVE mg/dL
Leukocytes,Ua: NEGATIVE
Nitrite: NEGATIVE
Protein, ur: NEGATIVE mg/dL
Specific Gravity, Urine: 1.014 (ref 1.005–1.030)
pH: 7 (ref 5.0–8.0)

## 2020-02-25 LAB — LIPID PANEL
Cholesterol: 135 mg/dL (ref 0–200)
HDL: 51 mg/dL (ref >40)
LDL Cholesterol: 73 mg/dL (ref 0–99)
Total CHOL/HDL Ratio: 2.6 RATIO
Triglycerides: 56 mg/dL (ref <150)
VLDL: 11 mg/dL (ref 0–40)

## 2020-02-25 LAB — ECHOCARDIOGRAM COMPLETE
Height: 62 in
Weight: 4480 oz

## 2020-02-25 LAB — VALPROIC ACID LEVEL: Valproic Acid Lvl: 38 ug/mL — ABNORMAL LOW (ref 50.0–100.0)

## 2020-02-25 MED ORDER — GABAPENTIN 300 MG PO CAPS: 600.0000 mg | ORAL_CAPSULE | Freq: Every day | ORAL | Status: DC

## 2020-02-25 MED ORDER — DIVALPROEX SODIUM 250 MG PO DR TAB: 250.0000 mg | DELAYED_RELEASE_TABLET | Freq: Every morning | ORAL | Status: DC

## 2020-02-25 MED ORDER — AZITHROMYCIN 500 MG PO TABS
250.0000 mg | ORAL_TABLET | Freq: Every day | ORAL | Status: AC
  Administered 2020-02-26 – 2020-02-29 (×4): 250 mg via ORAL
  Filled 2020-02-25 (×4): qty 1

## 2020-02-25 MED ORDER — ENOXAPARIN SODIUM 40 MG/0.4ML ~~LOC~~ SOLN
40.0000 mg | Freq: Two times a day (BID) | SUBCUTANEOUS | Status: DC
  Administered 2020-02-25 – 2020-03-03 (×14): 40 mg via SUBCUTANEOUS
  Filled 2020-02-25 (×14): qty 0.4

## 2020-02-25 MED ORDER — AZITHROMYCIN 250 MG PO TABS
500.0000 mg | ORAL_TABLET | Freq: Every day | ORAL | Status: AC
  Filled 2020-02-25: qty 2

## 2020-02-25 MED ORDER — GABAPENTIN 300 MG PO CAPS: 300.0000 mg | ORAL_CAPSULE | Freq: Every evening | ORAL | Status: DC | PRN

## 2020-02-25 MED ORDER — DIVALPROEX SODIUM 500 MG PO DR TAB
500.0000 mg | DELAYED_RELEASE_TABLET | Freq: Every day | ORAL | Status: DC
  Administered 2020-02-25 – 2020-03-02 (×7): 500 mg via ORAL
  Filled 2020-02-25 (×8): qty 1

## 2020-02-25 MED ORDER — DIVALPROEX SODIUM 500 MG PO DR TAB: 500.0000 mg | DELAYED_RELEASE_TABLET | Freq: Every evening | ORAL | Status: DC

## 2020-02-25 MED ORDER — SODIUM CHLORIDE 0.9 % IV SOLN
2.0000 g | INTRAVENOUS | Status: DC
  Administered 2020-02-25 – 2020-02-28 (×4): 2 g via INTRAVENOUS
  Filled 2020-02-25: qty 2
  Filled 2020-02-25: qty 20
  Filled 2020-02-25: qty 2
  Filled 2020-02-25: qty 20

## 2020-02-25 MED ORDER — GABAPENTIN 300 MG PO CAPS: 300.0000 mg | ORAL_CAPSULE | Freq: Every morning | ORAL | Status: DC

## 2020-02-25 MED ORDER — DIVALPROEX SODIUM 250 MG PO DR TAB
250.0000 mg | DELAYED_RELEASE_TABLET | Freq: Every morning | ORAL | Status: DC
  Administered 2020-02-26 – 2020-03-03 (×7): 250 mg via ORAL
  Filled 2020-02-25 (×10): qty 1

## 2020-02-25 NOTE — Progress Notes (Signed)
Pt on the unit from the ED. Very Lethargic. Not easily aroused. Vitals stable. PIV in place. Tele box 14 placed. MD notified of lethargic status. Will continue to monitor for further decline

## 2020-02-25 NOTE — Evaluation (Signed)
Speech Language Pathology Evaluation Patient Details Name: [name] MRN: [id number] DOB: April 21, 1966 Today's Date: 02/25/2020 Time: 0912-0920 SLP Time Calculation (min) (ACUTE ONLY): 8 min  Problem List:  Patient Active Problem List   Diagnosis Date Noted  . Lobar pneumonia ([place])   . Weakness 12/20/2019  . Hemiparesis affecting left side as late effect of cerebrovascular accident (CVA) ([place]) 12/20/2019  . Type 2 diabetes mellitus with hyperlipidemia ([place]) 12/20/2019  . Seizure disorder as sequela of cerebrovascular accident ([place]) 12/20/2019  . Urinary incontinence 12/20/2019  . Self-care deficit 12/20/2019  . Fall   . Lacunar stroke, acute ([place]) 07/24/2019  . Seizure ([place]) 07/24/2019  . Elevated troponin 07/24/2019  . AKI (acute kidney injury) ([place]) 07/24/2019  . Acute metabolic encephalopathy 94/70/9628  . Cerebral infarction ([place]) 08/14/2016  . Recurrent cerebrovascular accidents (CVAs) ([place]) 08/14/2016  . Delirium due to another medical condition 08/13/2016  . Urinary tract infection 08/13/2016  . Right foot infection 05/11/2016  . Foot abscess, right 05/11/2016  . Anxiety and depression 12/02/2015  . CVA (cerebral infarction) 11/27/2015  . Epilepsy ([place]) 06/17/2015  . Complicated migraine 36/62/9476  . Headache 05/19/2015  . Weakness of left side of body 05/19/2015  . History of cardioembolic cerebrovascular accident (CVA) 05/19/2015  . Morbid obesity with BMI of 50.0-59.9, adult ([place]) 03/19/2014  . HTN (hypertension)   . Diabetes mellitus without complication ([place])   . Hyperlipidemia   . [name] (transient ischemic attack) 03/17/2014   Past Medical History:  Past Medical History:  Diagnosis Date  . Allergic rhinitis   . Chickenpox   . Depression   . Diabetes ([place])   . Epilepsy ([place])   . Headache   . HTN (hypertension)   . Hyperlipidemia   . Seizures ([place])   . Stroke ([place])   . Urinary incontinence    Past Surgical History:  Past Surgical History:  Procedure  Laterality Date  . CESAREAN SECTION    . INCISION AND DRAINAGE Right 05/13/2016   Procedure: INCISION AND DRAINAGE;  Surgeon: Samara Deist, DPM;  Location: [facility] ORS;  Service: Podiatry;  Laterality: Right;   HPI:  [name] is a 54 y.o. female with past history of strokes (03/2014;  11/2015; 07/2019), history of hypertension, diabetes mellitus, hyperlipidemia, depression, epilepsy, hyperlipidemia. Pt with previous ST cognitive linguistic evaluation 08/14/2016 which identified moderate cognitive deficits in the areas of problem solving, task initiation, awareness and attention.    Assessment / Plan / Recommendation Clinical Impression  Pt's husband present and reports that pt is dependent on him for "all activities other than sleep." Pt has severe deficits in arousal, focused attention, task initiation, problem solving and awareness. She is largely dependent for all of these cognitive functions. Per her husband's report, this is her cognitive baseline. He reports left sided weakness. While no further services are indicated for acute care ST services, would recommend SNF follow-up to encourage pt to engage in tasks to foster attention and task initiation.     SLP Assessment  SLP Recommendation/Assessment: All further Speech Lanaguage Pathology  needs can be addressed in the next venue of care SLP Visit Diagnosis: Cognitive communication deficit (R41.841)    Follow Up Recommendations  Skilled Nursing facility    Frequency and Duration           SLP Evaluation Cognition  Overall Cognitive Status: History of cognitive impairments - at baseline Arousal/Alertness: Lethargic Orientation Level: Oriented to person Attention: Focused Focused Attention: Impaired Focused Attention Impairment: Verbal basic Awareness: Impaired Awareness  Impairment: Intellectual impairment;Emergent impairment;Anticipatory impairment Problem Solving: Impaired Problem Solving Impairment: Verbal basic;Functional  basic Executive Function:  (all impaired by lower level deficits) Behaviors: Lability Safety/Judgment: Impaired       Comprehension  Auditory Comprehension Overall Auditory Comprehension: Appears within functional limits for tasks assessed Visual Recognition/Discrimination Discrimination: Not tested Reading Comprehension Reading Status: Not tested    Expression Expression Primary Mode of Expression:  (nonverbal during assessment ) Verbal Expression Overall Verbal Expression:  (pt nonverbal ) Written Expression Dominant Hand: Right Written Expression: Within Functional Limits   Oral / Motor  Oral Motor/Sensory Function Overall Oral Motor/Sensory Function:  (unable to assess for any acute deficits d/t cognition ) Motor Speech Overall Motor Speech: Other (comment) (nonverbal throughout session)   GO            Uriah Philipson B. Dreama Saa M.S., CCC-SLP, [facility] Speech-Language Pathologist Rehabilitation Services Office [phone]         Reuel Derby 02/25/2020, 5:12 PM

## 2020-02-25 NOTE — Evaluation (Signed)
Occupational Therapy Evaluation Patient Details Name: [name] MRN: [id number] DOB: [birth year] Today's Date: 02/25/2020    History of Present Illness 54 y.o. female with medical history significant for recurrent CVAs with residual left hemiparesis and seizure disorder, diabetes, hypertension, urinary incontinence who was brought into the emergency room with a several day history of generalized weakness, causing falls, worsening in the past day to where patient will not get out of bed.  MRI significant for remote R MCA infarct, BUT NO ACUTE FINDINGS.   Clinical Impression   [name] presents to OT with generalized weakness, poor endurance, and impaired cognition that impact her ability to safely and independently complete functional tasks.  At baseline, pt requires max assist for ADL completion.  Pt is minimally responsive at baseline, and has impaired judgment/safety awareness/decision making.  Pt's husband reports pt spends a lot of her days in bed 2/2 lethargy and weakness, but will sometimes wander the house and try to get outside.  Pt's husband provides heavy physical assistance for pt's personal hygiene and has assistance from family to supervise pt when he is working.  Currently, pt is minimally responsive and is inconsistent with one-step command following.  Pt is able to initiate movement to assist with bed mobility, but requires max assist for any mobility.  Recommend +2 assist for any ambulation.  OTR provided max assist for pt to logroll to assist with perihygiene at bedlevel.  Pt requires max assist for supine <> sit.  Pt attempted sit to stand with max assist +2 but was unable to clear her bottom off surface of bed.  Did not attempt further transfer practice as pt's HR elevates and SpO2 drops with heavy activity.  Vitals quickly recover with rest.  [name] will continue to benefit from skilled OT services in acute setting to address functional strengthening, endurance, and safety and  independence in ADLs.  Recommend SNF upon discharge.    Follow Up Recommendations  SNF;Supervision/Assistance - 24 hour    Equipment Recommendations  Other (comment) (defer to post acute setting)    Recommendations for Other Services       Precautions / Restrictions Precautions Precautions: Fall Restrictions Weight Bearing Restrictions: No      Mobility Bed Mobility Overal bed mobility: Needs Assistance Bed Mobility: Supine to Sit;Sit to Supine;Rolling Rolling: Max assist   Supine to sit: Max assist;+2 for physical assistance Sit to supine: Max assist   General bed mobility comments: Pt shows minimal intiation of movements with one step commands, but requires maximal assist, sometimes +2 assist for logrolling and supine <> sit.  Transfers Overall transfer level: Needs assistance Equipment used: Rolling walker (2 wheeled) Transfers: Sit to/from Stand Sit to Stand: Max assist;+2 physical assistance         General transfer comment: Pt unable to clear bottom off surface, did not continue attempts 2/2 elevated HR    Balance Overall balance assessment: Needs assistance Sitting-balance support: Bilateral upper extremity supported Sitting balance-Leahy Scale: Fair Sitting balance - Comments: Once assisted to EOB (hips scooted forward and square to edge) she maintained balance w/o direct assist     Standing balance-Leahy Scale: Poor Standing balance comment: Kept starting to sit back w/o warning and would need considerable direct assist to keep/force hips forward.                           ADL either performed or assessed with clinical judgement   ADL Overall ADL's :  Needs assistance/impaired                                       General ADL Comments: Pt is almost dependent for ADLs.  She is occasionally able to walk to toilet with assist from her husband, but is often weak and incontinent.  Pt able to initiate movement with one step  commands, but lacks strength and endurance to participate in feeding, grooming, dressing, and bathing.     Vision Patient Visual Report: No change from baseline       Perception     Praxis      Pertinent Vitals/Pain Pain Assessment: Faces Faces Pain Scale: Hurts little more Pain Location: Pt grimacing with some abrupt activity, unclear where pt is having pain. Pain Intervention(s): Limited activity within patient's tolerance;Monitored during session;Repositioned     Hand Dominance Right   Extremity/Trunk Assessment Upper Extremity Assessment Upper Extremity Assessment: Generalized weakness;Difficult to assess due to impaired cognition   Lower Extremity Assessment Lower Extremity Assessment: Generalized weakness;Difficult to assess due to impaired cognition       Communication Communication Communication: Expressive difficulties   Cognition Arousal/Alertness: Lethargic Behavior During Therapy: Flat affect Overall Cognitive Status: Impaired/Different from baseline Area of Impairment: Attention;Following commands;Awareness                   Current Attention Level: Alternating   Following Commands: Follows one step commands inconsistently       General Comments: Pt minimally verbal in session.  Pt with inconsistent ability to respond to questions or follow one step commands.   Pt's husband reports pt has confusion and inconsisten command following at baseline, but has been more lethargic recently.   General Comments  Pt's HR elevated to high 90s/low 110s and SpO2 decreased to low 90s with activity, quickly recovers with rest    Exercises Other Exercises Other Exercises: provided education re: OT role and plan of care, fall and safety precautions, self care, discharge recommendations, bed mobility   Shoulder Instructions      Home Living Family/patient expects to be discharged to:: Unsure                                 Additional Comments:  husband present, reports that they normally are able to manage at home, but she is too weak and limited now for him to feel comfortable trying.  She does have steps to enter 4-5 with rails or 2-3 w/o rails      Prior Functioning/Environment Level of Independence: Needs assistance  Gait / Transfers Assistance Needed: Per husband, patient will transfer and perform short mobility distances within the home with his assistance.  With a walker when they can remind her or heavily leaning on furniture/walls if she self initiates ADL's / Homemaking Assistance Needed: Husband performs all IADLs and homemaking.  Per husband (or daughter), he assists with all self-care.  Patient is incontinent.  Pt's husband also reports pt frequently wanders at night and will walk outside which is unsafe. Communication / Swallowing Assistance Needed: currently with aspiration precautions Comments: patient has never driven        OT Problem List: Decreased strength;Decreased range of motion;Decreased activity tolerance;Impaired balance (sitting and/or standing);Decreased cognition;Decreased safety awareness;Decreased knowledge of use of DME or AE      OT Treatment/Interventions: Self-care/ADL training;Therapeutic exercise;Energy  conservation;DME and/or AE instruction;Therapeutic activities;Cognitive remediation/compensation;Patient/family education;Balance training    OT Goals(Current goals can be found in the care plan section) Acute Rehab OT Goals Patient Stated Goal: none stated, husband hoping she can get back to near baseline with some time at rehab OT Goal Formulation: With patient/family Time For Goal Achievement: 03/10/20 Potential to Achieve Goals: Good  OT Frequency: Min 2X/week   Barriers to D/C: Decreased caregiver support  pt's husband at risk for injury if he continues to provide max assist for pt, husband also works 3rd shift and relies on others to stay with pt       Co-evaluation   Reason for  Co-Treatment: Complexity of the patient's impairments (multi-system involvement);To address functional/ADL transfers PT goals addressed during session: Mobility/safety with mobility;Balance;Strengthening/ROM   SLP goals addressed during session: Cognition    AM-PAC OT "6 Clicks" Daily Activity     Outcome Measure Help from another person eating meals?: A Lot Help from another person taking care of personal grooming?: A Lot Help from another person toileting, which includes using toliet, bedpan, or urinal?: Total Help from another person bathing (including washing, rinsing, drying)?: A Lot Help from another person to put on and taking off regular upper body clothing?: A Lot Help from another person to put on and taking off regular lower body clothing?: A Lot 6 Click Score: 11   End of Session    Activity Tolerance: Patient limited by fatigue Patient left: in bed;with call bell/phone within reach;with family/visitor present  OT Visit Diagnosis: Other abnormalities of gait and mobility (R26.89);Muscle weakness (generalized) (M62.81)                Time: 1127-1203 OT Time Calculation (min): 36 min Charges:  OT General Charges $OT Visit: 1 Visit OT Evaluation $OT Eval Moderate Complexity: 1 Mod OT Treatments $Self Care/Home Management : 23-37 mins  Kathyrn Drown Tylynn Braniff, OTR/L 02/25/20, 12:55 PM

## 2020-02-25 NOTE — Evaluation (Signed)
Physical Therapy Evaluation Patient Details Name: [name] MRN: [id number] DOB: June 08, 1966 Today's Date: 02/25/2020   History of Present Illness  54 y.o. female with medical history significant for recurrent CVAs with residual left hemiparesis and seizure disorder, diabetes, hypertension, urinary incontinence who was brought into the emergency room with a several day history of generalized weakness, causing falls, worsening in the past day to where patient will not get out of bed.  MRI significant for remote R MCA infarct, BUT NO ACUTE FINDINGS.  Clinical Impression  Pt was able to show some participation with PT exam but was minimally verbal t/o the session.  She needed simple, slow cuing with delayed (and sometimes no) response.  Husband reports that she is inconsistent at home with some days sleeping all day, some days getting around the home and even getting outside with (and sometimes w/o AD despite daily reminders).  He does state that the last 2-3 days, however, she has been even more functionally and verbally limited that normal.  Pt will benefit from STR to try to work back to a more functional and safe-in-home status.  Follow Up Recommendations SNF    Equipment Recommendations  None recommended by PT    Recommendations for Other Services       Precautions / Restrictions Precautions Precautions: Fall Restrictions Weight Bearing Restrictions: No      Mobility  Bed Mobility Overal bed mobility: Needs Assistance Bed Mobility: Supine to Sit;Sit to Supine     Supine to sit: Mod assist;Max assist Sit to supine: Max assist   General bed mobility comments: Pt showed some very minimal initiation of movement toward EOB, but it quickly became clear that she would/could not lift torso PT assisted heavily.  Per husband, depending on the day she may get up on her own or need heavy assist   Transfers Overall transfer level: Needs assistance Equipment used: Rolling walker (2  wheeled) Transfers: Sit to/from Stand Sit to Stand: Min assist            Ambulation/Gait             General Gait Details: Unable to do any true ambulation, but pt did show erratic ability to do some very limited, modest stepping at EOB.  Heavy cuing with delayed and sometimes no initiation of movement but good apparently effort when   Stairs            Wheelchair Mobility    Modified Rankin (Stroke Patients Only)       Balance Overall balance assessment: Needs assistance Sitting-balance support: Bilateral upper extremity supported Sitting balance-Leahy Scale: Fair Sitting balance - Comments: Once assisted to EOB (hips scooted forward and square to edge) she maintained balance w/o direct assist     Standing balance-Leahy Scale: Poor Standing balance comment: Kept starting to sit back w/o warning and would need considerable direct assist to keep/force hips forward.                             Pertinent Vitals/Pain Pain Assessment:  (does not respond, appears not to have pain during activity)    Home Living Family/patient expects to be discharged to:: Unsure                 Additional Comments: husband present, reports that they normally are able to manage at home, but she is too weak and limited now for him to feel comfortable trying.  She does have  steps to enter 4-5 with rails or 2-3 w/o rails    Prior Function Level of Independence: Needs assistance   Gait / Transfers Assistance Needed: Per husband, patient will transfer and perform short mobility distances within the home with his assistance.  With a walker when they can remind her or heavily leaning on furniture/walls if she self initiates  ADL's / Homemaking Assistance Needed: Husband performs all IADLs and homemaking.  Per husband (or daughter), he assists with all self-care.  Patient is incontinent.        Hand Dominance   Dominant Hand: Right    Extremity/Trunk Assessment    Upper Extremity Assessment Upper Extremity Assessment: Generalized weakness;Difficult to assess due to impaired cognition    Lower Extremity Assessment Lower Extremity Assessment: Generalized weakness;Difficult to assess due to impaired cognition       Communication   Communication: Expressive difficulties  Cognition Arousal/Alertness: Lethargic Behavior During Therapy: Flat affect Overall Cognitive Status: Impaired/Different from baseline Area of Impairment: Attention;Following commands;Awareness                               General Comments: Pt has been minimally verbal on previous PT sessions (prior admits) and this holds true today. Inconsistent ability to follow even simple instruction      General Comments      Exercises     Assessment/Plan    PT Assessment Patient needs continued PT services  PT Problem List         PT Treatment Interventions DME instruction;Gait training;Stair training;Functional mobility training;Therapeutic activities;Therapeutic exercise;Balance training;Neuromuscular re-education;Cognitive remediation;Patient/family education    PT Goals (Current goals can be found in the Care Plan section)  Acute Rehab PT Goals Patient Stated Goal: none stated, husband hoping she can get back to near baseline with some time at rehab PT Goal Formulation: With patient Time For Goal Achievement: 03/10/20 Potential to Achieve Goals: Fair    Frequency Min 2X/week   Barriers to discharge        Co-evaluation PT/OT/SLP Co-Evaluation/Treatment: Yes Reason for Co-Treatment: Complexity of the patient's impairments (multi-system involvement);For patient/therapist safety;To address functional/ADL transfers PT goals addressed during session: Mobility/safety with mobility;Balance;Strengthening/ROM         AM-PAC PT "6 Clicks" Mobility  Outcome Measure Help needed turning from your back to your side while in a flat bed without using bedrails?: A  Lot Help needed moving from lying on your back to sitting on the side of a flat bed without using bedrails?: Total Help needed moving to and from a bed to a chair (including a wheelchair)?: A Lot Help needed standing up from a chair using your arms (e.g., wheelchair or bedside chair)?: A Little Help needed to walk in hospital room?: A Lot Help needed climbing 3-5 steps with a railing? : Total 6 Click Score: 11    End of Session Equipment Utilized During Treatment: Gait belt Activity Tolerance: Patient tolerated treatment well;Patient limited by lethargy Patient left: with bed alarm set;with call bell/phone within reach;with family/visitor present Nurse Communication: Mobility status PT Visit Diagnosis: Unsteadiness on feet (R26.81);Muscle weakness (generalized) (M62.81);Difficulty in walking, not elsewhere classified (R26.2)    Time: 4034-7425 PT Time Calculation (min) (ACUTE ONLY): 22 min   Charges:   PT Evaluation $PT Eval Low Complexity: 1 Low          Malachi Pro, DPT 02/25/2020, 12:24 PM

## 2020-02-25 NOTE — Progress Notes (Signed)
*  PRELIMINARY RESULTS* Echocardiogram 2D Echocardiogram has been performed.  Joanette Gula Brianna Bennett 02/25/2020, 9:07 AM

## 2020-02-25 NOTE — ED Notes (Signed)
Assigned bed @ 1257, spoke with [employer].

## 2020-02-25 NOTE — ED Notes (Signed)
Report given to Kim, RN.

## 2020-02-25 NOTE — Progress Notes (Signed)
Patient ID: [name], female   DOB: Mar 04, 1966, 54 y.o.   MRN: [id number] [facility] PROGRESS NOTE  Jeiry Birnbaum GDJ:[id number] DOB: [birth year] DOA: 02/24/2020 PCP: [facility]  HPI/Subjective: Patient answers some yes or no questions but easily falls asleep. Answers no to most questions. No abdominal pain. No burning on urination. No shortness of breath or chest pain. Husband states that she can hardly walk and normally she is able to walk holding onto the walls. She is very weak in her legs.  Objective: Vitals:   02/25/20 1240 02/25/20 1250  BP:    Pulse: 95 93  Resp:    Temp:    SpO2: 97% 95%    Intake/Output Summary (Last 24 hours) at 02/25/2020 1255 Last data filed at 02/25/2020 0249 Gross per 24 hour  Intake 100 ml  Output --  Net 100 ml   Filed Weights   02/24/20 1420  Weight: 127 kg    ROS: Review of Systems  Constitutional: Positive for malaise/fatigue. Negative for fever.  Eyes: Negative for blurred vision.  Respiratory: Negative for cough and shortness of breath.   Cardiovascular: Negative for chest pain.  Gastrointestinal: Negative for abdominal pain, constipation, diarrhea, nausea and vomiting.  Genitourinary: Negative for dysuria.  Musculoskeletal: Negative for joint pain.  Neurological: Negative for dizziness.   Exam: Physical Exam  Constitutional: She appears lethargic.  HENT:  Nose: No mucosal edema.  Mouth/Throat: No oropharyngeal exudate.  Eyes: Pupils are equal, round, and reactive to light. Conjunctivae and lids are normal.  Cardiovascular: S1 normal and S2 normal. Exam reveals no gallop.  No murmur heard. Respiratory: No respiratory distress. She has decreased breath sounds in the right lower field and the left lower field. She has no wheezes. She has no rhonchi. She has no rales.  GI: Soft. Bowel sounds are normal. There is no abdominal tenderness.  Musculoskeletal:     Right lower leg: Edema present.     Left  lower leg: Edema present.  Lymphadenopathy:    She has no cervical adenopathy.  Neurological: She appears lethargic.  Unable to straight leg raise.  Skin: Skin is warm. No rash noted. Nails show no clubbing.  Psychiatric:  Lethargic      Data Reviewed: Basic Metabolic Panel: Recent Labs  Lab 02/24/20 1450  NA 139  K 3.3*  CL 103  CO2 25  GLUCOSE 148*  BUN 23*  CREATININE 1.16*  CALCIUM 8.8*  MG 1.6*   Liver Function Tests: Recent Labs  Lab 02/24/20 1450  AST 15  ALT 13  ALKPHOS 34*  BILITOT 1.1  PROT 7.1  ALBUMIN 3.8   CBC: Recent Labs  Lab 02/24/20 1537  WBC 11.2*  NEUTROABS 8.7*  HGB 10.6*  HCT 34.1*  MCV 65.0*  PLT 198   BNP (last 3 results) Recent Labs    07/23/19 1128  BNP 34.0    CBG: Recent Labs  Lab 02/24/20 2038 02/25/20 0255 02/25/20 0625 02/25/20 0825 02/25/20 1245  GLUCAP 97 160* 161* 168* 205*    Recent Results (from the past 240 hour(s))  SARS Coronavirus 2 by RT PCR (hospital order, performed in [facility] hospital lab) Nasopharyngeal Nasopharyngeal Swab     Status: None   Collection Time: 02/24/20  6:06 PM   Specimen: Nasopharyngeal Swab  Result Value Ref Range Status   SARS Coronavirus 2 NEGATIVE NEGATIVE Final    Comment: (NOTE) SARS-CoV-2 target nucleic acids are NOT DETECTED. The SARS-CoV-2 RNA is generally detectable in  upper and lower respiratory specimens during the acute phase of infection. The lowest concentration of SARS-CoV-2 viral copies this assay can detect is 250 copies / mL. A negative result does not preclude SARS-CoV-2 infection and should not be used as the sole basis for treatment or other patient management decisions.  A negative result may occur with improper specimen collection / handling, submission of specimen other than nasopharyngeal swab, presence of viral mutation(s) within the areas targeted by this assay, and inadequate number of viral copies (<250 copies / mL). A negative result must  be combined with clinical observations, patient history, and epidemiological information. Fact Sheet for Patients:   StrictlyIdeas.no Fact Sheet for Healthcare Providers: BankingDealers.co.za This test is not yet approved or cleared  by the [place] FDA and has been authorized for detection and/or diagnosis of SARS-CoV-2 by FDA under an Emergency Use Authorization (EUA).  This EUA will remain in effect (meaning this test can be used) for the duration of the COVID-19 declaration under Section 564(b)(1) of the Act, 21 U.S.C. section 360bbb-3(b)(1), unless the authorization is terminated or revoked sooner. Performed at [facility], [address], [place], [place] [address]      Studies: CT Head Wo Contrast  Result Date: 02/24/2020 CLINICAL DATA:  Ataxia. Stroke suspected episode of left-sided weakness greater than baseline which has since resolved. EXAM: CT HEAD WITHOUT CONTRAST TECHNIQUE: Contiguous axial images were obtained from the base of the skull through the vertex without intravenous contrast. COMPARISON:  CT head without contrast 12/20/2019 and MR head without contrast 12/21/2019. FINDINGS: Brain: The remote right MCA territory infarct is stable. Ex vacuo dilation of the right lateral ventricle is again noted. Remote ischemic changes are present in the internal capsule. Mild periventricular white matter changes on the left are stable. A remote lacunar infarct in the left paramedian pons is stable. Brainstem and cerebellum are otherwise normal. Vascular: Atherosclerotic changes are again noted within the cavernous internal carotid arteries bilaterally. No hyperdense vessel is present. Skull: Calvarium is intact. No focal lytic or blastic lesions are present. No significant extracranial soft tissue lesion is present. Sinuses/Orbits: The paranasal sinuses and mastoid air cells are clear. The globes and orbits are within normal  limits. IMPRESSION: 1. No acute intracranial abnormality or significant interval change. 2. Stable remote right MCA territory infarct. 3. Remote lacunar infarct in the left paramedian pons. 4. Atherosclerosis. Electronically Signed   By: San Morelle M.D.   On: 02/24/2020 15:29   MR ANGIO HEAD WO CONTRAST  Result Date: 02/24/2020 CLINICAL DATA:  54 year old female with left side weakness, ataxia. EXAM: MRA HEAD WITHOUT CONTRAST TECHNIQUE: Angiographic images of the Circle of Willis were obtained using MRA technique without intravenous contrast. COMPARISON:  Head CT earlier today. Intracranial MRA 07/24/2019. Brain MRI 12/21/2019. FINDINGS: Study is moderately degraded by motion artifact despite repeated imaging attempts, similar to the November comparison. Antegrade flow in the posterior circulation appears stable with dominant left vertebral artery. The right vertebral probably functionally terminates in PICA. Obscured proximal basilar artery today, but the distal basilar is patent. Basilar tip and PCA origins remain patent. Degraded PCA branch detail, PCAs appear grossly stable since November. Antegrade flow in both ICA siphons, with obscured petrous ICA segments today. Partially obscured supraclinoid segments. Bilateral carotid termini remain patent. Bilateral M1 and A1 segments remain patent. Better visualization of the bilateral ACA branches today which appear normal. MCA branch detail is obscured similar to the prior exam but there is probably asymmetric right MCA  stenosis in the setting of chronic right MCA territory ischemia. The left M1 seems stable and within normal limits. IMPRESSION: 1. Motion degraded intracranial MRA similar to that in November. Follow-up Head And Neck CTA with contrast would be valuable in light of inability to tolerate MRA. 2. No emergent large vessel occlusion suspected. 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  Electronically Signed   By: Odessa Fleming M.D.   On: 02/24/2020 22:25   MR BRAIN WO CONTRAST  Result Date: 02/25/2020 CLINICAL DATA:  Focal neurologic deficit EXAM: MRI HEAD WITHOUT CONTRAST TECHNIQUE: Multiplanar, multiecho pulse sequences of the brain and surrounding structures were obtained without intravenous contrast. COMPARISON:  None. FINDINGS: The examination is severely motion degraded. There is no acute ischemia. No midline shift or other mass effect. There is a large area of encephalomalacia in right temporal lobe. IMPRESSION: 1. Severely motion degraded examination. 2. No acute ischemia. 3. Large area of encephalomalacia in the right temporal lobe. Electronically Signed   By: Deatra Robinson M.D.   On: 02/25/2020 00:10   US Carotid Bilateral (at [facility] and [facility] only)  Result Date: 02/25/2020 CLINICAL DATA:  [name].  Hypertension, hyperlipidemia, diabetes EXAM: BILATERAL CAROTID DUPLEX ULTRASOUND TECHNIQUE: Wallace Cullens scale imaging, color Doppler and duplex ultrasound were performed of bilateral carotid and vertebral arteries in the neck. Technologist describes technically difficult study secondary to unresponsive patient with coughing fits. COMPARISON:  07/24/2019 FINDINGS: Criteria: Quantification of carotid stenosis is based on velocity parameters that correlate the residual internal carotid diameter with NASCET-based stenosis levels, using the diameter of the distal internal carotid lumen as the denominator for stenosis measurement. The following velocity measurements were obtained: RIGHT ICA: 57/20 cm/sec CCA: 89/16 cm/sec SYSTOLIC ICA/CCA RATIO:  0.7 ECA: 320 cm/sec LEFT ICA: 87/27 cm/sec CCA: 159/26 cm/sec SYSTOLIC ICA/CCA RATIO:  0.5 ECA: 166 cm/sec RIGHT CAROTID ARTERY: Partially calcified plaque in the bulb, extending into proximal ECA and ICA. Elevated peak systolic velocities just beyond the ECA origin. Normal waveforms and color Doppler signal in the ICA, without high-grade stenosis evident. RIGHT  VERTEBRAL ARTERY:  Normal flow direction and waveform. LEFT CAROTID ARTERY: Eccentric partially calcified plaque in the bulb and ICA origin, without high-grade stenosis. Normal waveforms and color Doppler signal. Mild tortuosity. LEFT VERTEBRAL ARTERY:  Normal flow direction and waveform. IMPRESSION: 1. Bilateral carotid bifurcation plaque resulting in less than 50% diameter ICA stenosis. 2. 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 3. Antegrade bilateral vertebral arterial flow. Electronically Signed   By: Corlis Leak M.D.   On: 02/25/2020 09:54   DG Chest Portable 1 View  Result Date: 02/24/2020 CLINICAL DATA:  Weakness, facial droop, slurred speech EXAM: PORTABLE CHEST 1 VIEW COMPARISON:  12/20/2019 FINDINGS: Single frontal view of the chest demonstrates an unremarkable cardiac silhouette. There is increased density in the retrocardiac region which may reflect atelectasis or airspace disease. No effusion or pneumothorax. No acute bony abnormalities. IMPRESSION: 1. New retrocardiac consolidation compatible with airspace disease or atelectasis. Electronically Signed   By: Sharlet Salina M.D.   On: 02/24/2020 17:24    Scheduled Meds: .  stroke: mapping our early stages of recovery book   Does not apply Once  . aspirin  325 mg Oral Daily  . atorvastatin  40 mg Oral q1800  . azithromycin  500 mg Oral Daily   Followed by  . [START ON 02/26/2020] azithromycin  250 mg Oral Daily  . divalproex  250-500 mg Oral BID  . gabapentin  300-900 mg Oral BID  . insulin aspart  0-9 Units Subcutaneous Q4H  . insulin detemir  35 Units Subcutaneous Daily   Continuous Infusions: . sodium chloride Stopped (02/25/20 1245)  . cefTRIAXone (ROCEPHIN)  IV      Assessment/Plan:  1. Acute metabolic encephalopathy. MRI of the brain does not show any signs of acute stroke. I will check a Depakote level. Could be infection related with pneumonia. Chest x-ray last night  showed possible retrocardiac pneumonia. Will repeat chest x-ray today. Start empiric antibiotics with Rocephin and Zithromax. Continue IV fluids. 2. Weakness. Unable to straight leg raise. Physical therapy evaluation. 3. Prior nonhemorrhagic stroke with encephalomalacia. On aspirin and atorvastatin. 4. History of seizure disorder on Depakote and gabapentin 5. Type 2 diabetes mellitus with hyperlipidemia on detemir insulin and sliding scale 6. Morbid obesity with a BMI of 51.21.  Code Status:     Code Status Orders  (From admission, onward)         Start     Ordered   02/24/20 2241  Full code  Continuous        02/24/20 2240        Code Status History    Date Active Date Inactive Code Status Order ID Comments User Context   12/20/2019 2352 12/22/2019 2046 Full Code [id number]  Andris Baumann, MD ED   07/23/2019 1759 07/25/2019 1954 Full Code [id number]  Rolly Salter, MD ED   04/16/2019 1554 04/17/2019 1746 Full Code [id number]  Auburn Bilberry, MD Inpatient   08/14/2016 2201 08/16/2016 2245 Full Code [id number]  Michael Litter, MD Inpatient   05/11/2016 1729 05/15/2016 1514 Full Code [id number]  Katharina Caper, MD Inpatient   11/27/2015 1235 11/28/2015 1626 Full Code [id number]  Houston Siren, MD ED   06/14/2015 0620 06/15/2015 2214 Full Code [id number]  Arnaldo Natal, MD Inpatient   05/19/2015 0111 05/19/2015 2041 Full Code [id number]  Crissie Figures, MD Inpatient   Advance Care Planning Activity     Family Communication: Husband at the bedside Disposition Plan: Status is: Inpatient   Dispo: The patient is from: Home              Anticipated d/c is to: Home              Anticipated d/c date is: Likely will need a few days of working with physical therapy.              Patient currently with altered mental status and unable to walk. Repeating chest x-ray concern for pneumonia and antibiotics started. We will check a Depakote level just in case that is contributing to the patient's altered  mental status. Neurology consultation.  Consultants:  Neurology  Antibiotics:  Start Rocephin and Zithromax  Time spent: 28 minutes  Karry [employer]

## 2020-02-25 NOTE — ED Notes (Signed)
ECHO at bedside.

## 2020-02-25 NOTE — ED Notes (Signed)
Pt weaned off supplemental O2- O2 sats stable at this time on RA

## 2020-02-25 NOTE — ED Notes (Addendum)
Pt continues to be lethargic but easily arousable to voice- pt began coughing and O2 sats dropped- pt placed on 6L [place] and O2 now at 100%

## 2020-02-25 NOTE — ED Notes (Signed)
Pt incontinent of urine, peri care done.

## 2020-02-26 DIAGNOSIS — G9341 Metabolic encephalopathy: Secondary | ICD-10-CM

## 2020-02-26 LAB — BASIC METABOLIC PANEL
Anion gap: 11 (ref 5–15)
BUN: 13 mg/dL (ref 6–20)
CO2: 24 mmol/L (ref 22–32)
Calcium: 8.4 mg/dL — ABNORMAL LOW (ref 8.9–10.3)
Chloride: 103 mmol/L (ref 98–111)
Creatinine, Ser: 0.95 mg/dL (ref 0.44–1.00)
GFR calc Af Amer: >60 mL/min (ref >60)
GFR calc non Af Amer: >60 mL/min (ref >60)
Glucose, Bld: 182 mg/dL — ABNORMAL HIGH (ref 70–99)
Potassium: 3.4 mmol/L — ABNORMAL LOW (ref 3.5–5.1)
Sodium: 138 mmol/L (ref 135–145)

## 2020-02-26 LAB — GLUCOSE, CAPILLARY
Glucose-Capillary: 157 mg/dL — ABNORMAL HIGH (ref 70–99)
Glucose-Capillary: 162 mg/dL — ABNORMAL HIGH (ref 70–99)
Glucose-Capillary: 167 mg/dL — ABNORMAL HIGH (ref 70–99)
Glucose-Capillary: 172 mg/dL — ABNORMAL HIGH (ref 70–99)
Glucose-Capillary: 211 mg/dL — ABNORMAL HIGH (ref 70–99)

## 2020-02-26 LAB — CBC
HCT: 28.7 % — ABNORMAL LOW (ref 36.0–46.0)
Hemoglobin: 9 g/dL — ABNORMAL LOW (ref 12.0–15.0)
MCH: 20.1 pg — ABNORMAL LOW (ref 26.0–34.0)
MCHC: 31.4 g/dL (ref 30.0–36.0)
MCV: 64.1 fL — ABNORMAL LOW (ref 80.0–100.0)
Platelets: 165 10*3/uL (ref 150–400)
RBC: 4.48 MIL/uL (ref 3.87–5.11)
RDW: 20.5 % — ABNORMAL HIGH (ref 11.5–15.5)
WBC: 13.4 10*3/uL — ABNORMAL HIGH (ref 4.0–10.5)
nRBC: 0 % (ref 0.0–0.2)

## 2020-02-26 MED ORDER — POTASSIUM CHLORIDE 20 MEQ PO PACK
20.0000 meq | PACK | Freq: Once | ORAL | Status: AC
  Administered 2020-02-26: 20 meq via ORAL
  Filled 2020-02-26: qty 1

## 2020-02-26 MED ORDER — CLOPIDOGREL BISULFATE 75 MG PO TABS
75.0000 mg | ORAL_TABLET | Freq: Every day | ORAL | Status: DC
  Administered 2020-02-26 – 2020-03-03 (×7): 75 mg via ORAL
  Filled 2020-02-26 (×7): qty 1

## 2020-02-26 MED ORDER — SODIUM CHLORIDE 0.9% FLUSH
10.0000 mL | Freq: Two times a day (BID) | INTRAVENOUS | Status: DC
  Administered 2020-02-26: 10 mL
  Administered 2020-02-26: 40 mL
  Administered 2020-02-27 – 2020-02-28 (×3): 10 mL

## 2020-02-26 MED ORDER — SODIUM CHLORIDE 0.9% FLUSH: 10.0000 mL | INTRAVENOUS | Status: DC | PRN

## 2020-02-26 MED ORDER — ASPIRIN EC 81 MG PO TBEC
81.0000 mg | DELAYED_RELEASE_TABLET | Freq: Every day | ORAL | Status: DC
  Administered 2020-02-27 – 2020-03-03 (×6): 81 mg via ORAL
  Filled 2020-02-26 (×6): qty 1

## 2020-02-26 MED ORDER — SODIUM CHLORIDE 0.9 % IV SOLN: INTRAVENOUS | Status: DC

## 2020-02-26 MED ORDER — GUAIFENESIN-DM 100-10 MG/5ML PO SYRP
5.0000 mL | ORAL_SOLUTION | ORAL | Status: DC | PRN
  Administered 2020-02-27 – 2020-03-01 (×3): 5 mL via ORAL
  Filled 2020-02-26 (×3): qty 5

## 2020-02-26 NOTE — Consult Note (Addendum)
Reason for Consult:AMS Requesting Physician: Renae GlossWieting  CC: AMS  I have been asked by Dr. Renae GlossWieting to see this patient in consultation for AMS.  HPI: [name] is an 54 y.o. female with medical history significant of depression, diabetes, epilepsy, hypertension, hyperlipidemia, history of prior stroke and residual left sided weakness who presents with lethargy and worsening left sided weakness.  At baseline patient is at home with family.  Walks with a walker or using the walls in the home.  Is up most nights and sleeps during the day.  Husband reports fatigue and weakness for the past week prior to admission.  Was unable to ambulate on the day of admission and was brought in for evaluation.  Patient has had similar presentations with UTIs in the past.    Past Medical History:  Diagnosis Date  . Allergic rhinitis   . Chickenpox   . Depression   . Diabetes (HCC)   . Epilepsy (HCC)   . Headache   . HTN (hypertension)   . Hyperlipidemia   . Seizures (HCC)   . Stroke (HCC)   . Urinary incontinence     Past Surgical History:  Procedure Laterality Date  . CESAREAN SECTION    . INCISION AND DRAINAGE Right 05/13/2016   Procedure: INCISION AND DRAINAGE;  Surgeon: Gwyneth RevelsJustin Fowler, DPM;  Location: [facility] ORS;  Service: Podiatry;  Laterality: Right;    Family History  Problem Relation Age of Onset  . Hypertension Mother   . Diabetes Mellitus II Mother   . Hypertension Father   . Pancreatic cancer Father     Social History:  reports that she has never smoked. She has never used smokeless tobacco. She reports that she does not drink alcohol and does not use drugs.  No Known Allergies  Medications:  I have reviewed the patient's current medications. Prior to Admission:  Medications Prior to Admission  Medication Sig Dispense Refill Last Dose  . atorvastatin (LIPITOR) 40 MG tablet Take 1 tablet (40 mg total) by mouth daily at 6 PM. 30 tablet 0 02/23/2020 at 1800  . clopidogrel (PLAVIX) 75 MG  tablet Take 75 mg by mouth daily.   02/24/2020 at 0800  . divalproex (DEPAKOTE) 250 MG DR tablet Take 250-500 mg by mouth 2 (two) times daily. Take one tablet (250 mg) by mouth every morning and two tablets (500 mg) at bedtime   02/24/2020 at 0800  . escitalopram (LEXAPRO) 10 MG tablet Take 10 mg by mouth daily.   02/24/2020 at 0800  . gabapentin (NEURONTIN) 300 MG capsule Take 300-900 mg by mouth 2 (two) times daily. Take one capsule (300 mg) by mouth every morning and two capsules (600 mg) at bedtime. (May take an additional 300 mg at bedtime as needed)   02/24/2020 at 0800  . insulin aspart (NOVOLOG FLEXPEN) 100 UNIT/ML FlexPen Inject 10 Units into the skin 3 (three) times daily with meals. (Patient taking differently: Inject 15 Units into the skin 3 (three) times daily with meals. ) 15 mL 0 02/24/2020 at 0900  . Insulin Detemir (LEVEMIR FLEXPEN) 100 UNIT/ML Pen Inject 40 Units into the skin daily at 10 pm. (Patient taking differently: Inject 35 Units into the skin daily at 10 pm. ) 15 mL 0 02/23/2020 at 2200  . lisinopril (ZESTRIL) 5 MG tablet Take 5 mg by mouth daily.   02/24/2020 at 0800  . metFORMIN (GLUCOPHAGE-XR) 500 MG 24 hr tablet Take 500 mg by mouth 2 (two) times daily.   02/24/2020 at  0800  . aspirin EC 325 MG EC tablet Take 1 tablet (325 mg total) by mouth daily. (Patient not taking: Reported on 02/24/2020) 60 tablet 3 Not Taking at Unknown time  . metoprolol tartrate (LOPRESSOR) 25 MG tablet Take 25 mg by mouth 2 (two) times a day.   Not Taking at Unknown time  . saccharomyces boulardii (FLORASTOR) 250 MG capsule Take 250 mg by mouth 2 (two) times daily.   Not Taking at Unknown time  . VESICARE 10 MG tablet Take 10 mg by mouth 2 (two) times daily.      Scheduled: .  stroke: mapping our early stages of recovery book   Does not apply Once  . aspirin  325 mg Oral Daily  . atorvastatin  40 mg Oral q1800  . azithromycin  250 mg Oral Daily  . divalproex  500 mg Oral QHS   And  . divalproex  250 mg Oral q  AM  . enoxaparin (LOVENOX) injection  40 mg Subcutaneous Q12H  . insulin aspart  0-9 Units Subcutaneous Q4H  . insulin detemir  35 Units Subcutaneous Daily    ROS: History obtained from husband  General ROS: lethargy Psychological ROS: negative for - behavioral disorder, hallucinations, memory difficulties, mood swings or suicidal ideation Ophthalmic ROS: negative for - blurry vision, double vision, eye pain or loss of vision ENT ROS: negative for - epistaxis, nasal discharge, oral lesions, sore throat, tinnitus or vertigo Allergy and Immunology ROS: negative for - hives or itchy/watery eyes Hematological and Lymphatic ROS: negative for - bleeding problems, bruising or swollen lymph nodes Endocrine ROS: negative for - galactorrhea, hair pattern changes, polydipsia/polyuria or temperature intolerance Respiratory ROS: cough Cardiovascular ROS: negative for - chest pain, dyspnea on exertion, edema or irregular heartbeat Gastrointestinal ROS: negative for - abdominal pain, diarrhea, hematemesis, nausea/vomiting or stool incontinence Genito-Urinary ROS: negative for - dysuria, hematuria, incontinence or urinary frequency/urgency Musculoskeletal ROS: negative for - joint swelling or muscular weakness Neurological ROS: as noted in HPI Dermatological ROS: negative for rash and skin lesion changes  Physical Examination: Blood pressure 117/70, pulse (!) 107, temperature 98.6 F (37 C), resp. rate 18, height  (1.575 m), weight 127.5 kg, SpO2 96 %.  HEENT-  Normocephalic, no lesions, without obvious abnormality.  Normal external eye and conjunctiva.  Normal TM's bilaterally.  Normal auditory canals and external ears. Normal external nose, mucus membranes and septum.  Normal pharynx. Cardiovascular- S1, S2 normal, pulses palpable throughout   Lungs- chest clear, no wheezing, rales, normal symmetric air entry Abdomen- soft, non-tender; bowel sounds normal; no masses,  no  organomegaly Extremities- mild LE edema Lymph-no adenopathy palpable Musculoskeletal-no joint tenderness, deformity or swelling Skin-warm and dry, no hyperpigmentation, vitiligo, or suspicious lesions  Neurological Examination   Mental Status: Alert.  Follows commands but does not really speak.   Cranial Nerves: II: Blinks to bilateral confrontation.  Pupils equal, round, reactive to light and accommodation III,IV, VI: ptosis not present, extra-ocular motions intact bilaterally V,VII: decreased left NLF, facial light touch sensation normal bilaterally VIII: hearing normal bilaterally IX,X: gag reflex present XI: bilateral shoulder shrug XII: midline tongue extension Motor: Patient able to lift both upper extremities against gravity with the left less so than the right.  Mild decrease in left hand grip.  Lifts each leg less than an inch off the bed.   Sensory: Pinprick and light touch intact throughout, bilaterally Deep Tendon Reflexes: 2+ in the upper extremities, 1+ at the knees and absent at the  ankles.   Plantars: Right: downgoing                                Left: downgoing Cerebellar: Finger-to-nose testing intact Gait: not tested due to safety concerns    Laboratory Studies:   Basic Metabolic Panel: Recent Labs  Lab 02/24/20 1450 02/26/20 0808  NA 139 138  K 3.3* 3.4*  CL 103 103  CO2 25 24  GLUCOSE 148* 182*  BUN 23* 13  CREATININE 1.16* 0.95  CALCIUM 8.8* 8.4*  MG 1.6*  --     Liver Function Tests: Recent Labs  Lab 02/24/20 1450  AST 15  ALT 13  ALKPHOS 34*  BILITOT 1.1  PROT 7.1  ALBUMIN 3.8   No results for input(s): LIPASE, AMYLASE in the last 168 hours. No results for input(s): AMMONIA in the last 168 hours.  CBC: Recent Labs  Lab 02/24/20 1537 02/26/20 0808  WBC 11.2* 13.4*  NEUTROABS 8.7*  --   HGB 10.6* 9.0*  HCT 34.1* 28.7*  MCV 65.0* 64.1*  PLT 198 165    Cardiac Enzymes: No results for input(s): CKTOTAL, CKMB, CKMBINDEX,  TROPONINI in the last 168 hours.  BNP: Invalid input(s): POCBNP  CBG: Recent Labs  Lab 02/25/20 1548 02/25/20 2050 02/26/20 0007 02/26/20 0459 02/26/20 0758  GLUCAP 172* 213* 157* 162* 167*    Microbiology: Results for orders placed or performed during the hospital encounter of 02/24/20  SARS Coronavirus 2 by RT PCR (hospital order, performed in [facility] hospital lab) Nasopharyngeal Nasopharyngeal Swab     Status: None   Collection Time: 02/24/20  6:06 PM   Specimen: Nasopharyngeal Swab  Result Value Ref Range Status   SARS Coronavirus 2 NEGATIVE NEGATIVE Final    Comment: (NOTE) SARS-CoV-2 target nucleic acids are NOT DETECTED. The SARS-CoV-2 RNA is generally detectable in upper and lower respiratory specimens during the acute phase of infection. The lowest concentration of SARS-CoV-2 viral copies this assay can detect is 250 copies / mL. A negative result does not preclude SARS-CoV-2 infection and should not be used as the sole basis for treatment or other patient management decisions.  A negative result may occur with improper specimen collection / handling, submission of specimen other than nasopharyngeal swab, presence of viral mutation(s) within the areas targeted by this assay, and inadequate number of viral copies (<250 copies / mL). A negative result must be combined with clinical observations, patient history, and epidemiological information. Fact Sheet for Patients:   StrictlyIdeas.no Fact Sheet for Healthcare Providers: BankingDealers.co.za This test is not yet approved or cleared  by the [place] FDA and has been authorized for detection and/or diagnosis of SARS-CoV-2 by FDA under an Emergency Use Authorization (EUA).  This EUA will remain in effect (meaning this test can be used) for the duration of the COVID-19 declaration under Section 564(b)(1) of the Act, 21 U.S.C. section 360bbb-3(b)(1), unless the  authorization is terminated or revoked sooner. Performed at [facility], [address] [place], [place] [address]     Coagulation Studies: No results for input(s): LABPROT, INR in the last 72 hours.  Urinalysis:  Recent Labs  Lab 02/24/20 1645  COLORURINE YELLOW*  YELLOW*  LABSPEC 1.014  1.014  PHURINE 7.0  7.0  GLUCOSEU NEGATIVE  NEGATIVE  HGBUR NEGATIVE  NEGATIVE  BILIRUBINUR NEGATIVE  NEGATIVE  KETONESUR NEGATIVE  NEGATIVE  PROTEINUR NEGATIVE  NEGATIVE  NITRITE NEGATIVE  NEGATIVE  LEUKOCYTESUR NEGATIVE  TRACE*    Lipid Panel:     Component Value Date/Time   CHOL 135 02/25/2020 0628   CHOL 215 (H) 12/27/2014 1033   TRIG 56 02/25/2020 0628   TRIG 103 12/27/2014 1033   HDL 51 02/25/2020 0628   HDL 46 12/27/2014 1033   CHOLHDL 2.6 02/25/2020 0628   VLDL 11 02/25/2020 0628   VLDL 21 12/27/2014 1033   LDLCALC 73 02/25/2020 0628   LDLCALC 148 (H) 12/27/2014 1033    HgbA1C:  Lab Results  Component Value Date   HGBA1C 7.7 (H) 02/24/2020    Urine Drug Screen:      Component Value Date/Time   LABOPIA NONE DETECTED 02/24/2020 1645   COCAINSCRNUR NONE DETECTED 02/24/2020 1645   LABBENZ NONE DETECTED 02/24/2020 1645   AMPHETMU NONE DETECTED 02/24/2020 1645   THCU NONE DETECTED 02/24/2020 1645   LABBARB NONE DETECTED 02/24/2020 1645    Alcohol Level: No results for input(s): ETH in the last 168 hours.  Other results: EKG: sinus rhythm at 97 bpm.  Imaging: CT Head Wo Contrast  Result Date: 02/24/2020 CLINICAL DATA:  Ataxia. Stroke suspected episode of left-sided weakness greater than baseline which has since resolved. EXAM: CT HEAD WITHOUT CONTRAST TECHNIQUE: Contiguous axial images were obtained from the base of the skull through the vertex without intravenous contrast. COMPARISON:  CT head without contrast 12/20/2019 and MR head without contrast 12/21/2019. FINDINGS: Brain: The remote right MCA territory infarct is stable. Ex vacuo dilation  of the right lateral ventricle is again noted. Remote ischemic changes are present in the internal capsule. Mild periventricular white matter changes on the left are stable. A remote lacunar infarct in the left paramedian pons is stable. Brainstem and cerebellum are otherwise normal. Vascular: Atherosclerotic changes are again noted within the cavernous internal carotid arteries bilaterally. No hyperdense vessel is present. Skull: Calvarium is intact. No focal lytic or blastic lesions are present. No significant extracranial soft tissue lesion is present. Sinuses/Orbits: The paranasal sinuses and mastoid air cells are clear. The globes and orbits are within normal limits. IMPRESSION: 1. No acute intracranial abnormality or significant interval change. 2. Stable remote right MCA territory infarct. 3. Remote lacunar infarct in the left paramedian pons. 4. Atherosclerosis. Electronically Signed   By: Marin Roberts M.D.   On: 02/24/2020 15:29   MR ANGIO HEAD WO CONTRAST  Result Date: 02/24/2020 CLINICAL DATA:  54 year old female with left side weakness, ataxia. EXAM: MRA HEAD WITHOUT CONTRAST TECHNIQUE: Angiographic images of the Circle of Willis were obtained using MRA technique without intravenous contrast. COMPARISON:  Head CT earlier today. Intracranial MRA 07/24/2019. Brain MRI 12/21/2019. FINDINGS: Study is moderately degraded by motion artifact despite repeated imaging attempts, similar to the November comparison. Antegrade flow in the posterior circulation appears stable with dominant left vertebral artery. The right vertebral probably functionally terminates in PICA. Obscured proximal basilar artery today, but the distal basilar is patent. Basilar tip and PCA origins remain patent. Degraded PCA branch detail, PCAs appear grossly stable since November. Antegrade flow in both ICA siphons, with obscured petrous ICA segments today. Partially obscured supraclinoid segments. Bilateral carotid termini remain  patent. Bilateral M1 and A1 segments remain patent. Better visualization of the bilateral ACA branches today which appear normal. MCA branch detail is obscured similar to the prior exam but there is probably asymmetric right MCA stenosis in the setting of chronic right MCA territory ischemia. The left M1 seems stable and within normal limits. IMPRESSION: 1. Motion degraded  intracranial MRA similar to that in November. Follow-up Head And Neck CTA with contrast would be valuable in light of inability to tolerate MRA. 2. No emergent large vessel occlusion suspected. 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 Electronically Signed   By: Odessa Fleming M.D.   On: 02/24/2020 22:25   MR BRAIN WO CONTRAST  Result Date: 02/25/2020 CLINICAL DATA:  Focal neurologic deficit EXAM: MRI HEAD WITHOUT CONTRAST TECHNIQUE: Multiplanar, multiecho pulse sequences of the brain and surrounding structures were obtained without intravenous contrast. COMPARISON:  None. FINDINGS: The examination is severely motion degraded. There is no acute ischemia. No midline shift or other mass effect. There is a large area of encephalomalacia in right temporal lobe. IMPRESSION: 1. Severely motion degraded examination. 2. No acute ischemia. 3. Large area of encephalomalacia in the right temporal lobe. Electronically Signed   By: Deatra Robinson M.D.   On: 02/25/2020 00:10   US Carotid Bilateral (at [facility] and [facility] only)  Result Date: 02/25/2020 CLINICAL DATA:  [name].  Hypertension, hyperlipidemia, diabetes EXAM: BILATERAL CAROTID DUPLEX ULTRASOUND TECHNIQUE: Wallace Cullens scale imaging, color Doppler and duplex ultrasound were performed of bilateral carotid and vertebral arteries in the neck. Technologist describes technically difficult study secondary to unresponsive patient with coughing fits. COMPARISON:  07/24/2019 FINDINGS: Criteria: Quantification of carotid stenosis is based on velocity parameters that  correlate the residual internal carotid diameter with NASCET-based stenosis levels, using the diameter of the distal internal carotid lumen as the denominator for stenosis measurement. The following velocity measurements were obtained: RIGHT ICA: 57/20 cm/sec CCA: 89/16 cm/sec SYSTOLIC ICA/CCA RATIO:  0.7 ECA: 320 cm/sec LEFT ICA: 87/27 cm/sec CCA: 159/26 cm/sec SYSTOLIC ICA/CCA RATIO:  0.5 ECA: 166 cm/sec RIGHT CAROTID ARTERY: Partially calcified plaque in the bulb, extending into proximal ECA and ICA. Elevated peak systolic velocities just beyond the ECA origin. Normal waveforms and color Doppler signal in the ICA, without high-grade stenosis evident. RIGHT VERTEBRAL ARTERY:  Normal flow direction and waveform. LEFT CAROTID ARTERY: Eccentric partially calcified plaque in the bulb and ICA origin, without high-grade stenosis. Normal waveforms and color Doppler signal. Mild tortuosity. LEFT VERTEBRAL ARTERY:  Normal flow direction and waveform. IMPRESSION: 1. Bilateral carotid bifurcation plaque resulting in less than 50% diameter ICA stenosis. 2. 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 3. Antegrade bilateral vertebral arterial flow. Electronically Signed   By: Corlis Leak M.D.   On: 02/25/2020 09:54   DG Chest Port 1 View  Result Date: 02/25/2020 CLINICAL DATA:  Altered mental status.  Lethargic. EXAM: PORTABLE CHEST 1 VIEW COMPARISON:  Chest x-ray 02/24/2020 FINDINGS: The heart is enlarged but stable. Stable tortuosity of the thoracic aorta. Persistent left basilar density, atelectasis versus infiltrate. No definite pleural effusions. No worrisome pulmonary lesions. IMPRESSION: Persistent left basilar density, atelectasis versus infiltrate. Electronically Signed   By: Rudie Meyer M.D.   On: 02/25/2020 13:11   DG Chest Portable 1 View  Result Date: 02/24/2020 CLINICAL DATA:  Weakness, facial droop, slurred speech EXAM: PORTABLE CHEST 1 VIEW COMPARISON:   12/20/2019 FINDINGS: Single frontal view of the chest demonstrates an unremarkable cardiac silhouette. There is increased density in the retrocardiac region which may reflect atelectasis or airspace disease. No effusion or pneumothorax. No acute bony abnormalities. IMPRESSION: 1. New retrocardiac consolidation compatible with airspace disease or atelectasis. Electronically Signed   By: Sharlet Salina M.D.   On: 02/24/2020 17:24   ECHOCARDIOGRAM COMPLETE  Result Date:  02/25/2020    ECHOCARDIOGRAM REPORT   Patient Name:   BREYANA FOLLANSBEE Date of Exam: 02/25/2020 Medical Rec #:  [id number]  Height:       62.0 in Accession #:    [id number] Weight:       280.0 lb Date of Birth:  1965-12-29  BSA:          2.206 m Patient Age:    53 years   BP:           174/92 mmHg Patient Gender: F          HR:           97 bpm. Exam Location:  [facility] Procedure: 2D Echo, Limited Color Doppler and Cardiac Doppler Indications:     G45.9 [name]  History:         Patient has prior history of Echocardiogram examinations, most                  recent 07/24/2019. Stroke; Risk Factors:Dyslipidemia, Diabetes                  and Hypertension.  Sonographer:     Humphrey Rolls RDCS (AE) Referring Phys:  Kenn File DOUTOVA Diagnosing Phys: Julien Nordmann MD  Sonographer Comments: Image acquisition challenging due to patient body habitus. IMPRESSIONS  1. Left ventricular ejection fraction, by estimation, is 60 to 65%. The left ventricle has normal function. The left ventricle has no regional wall motion abnormalities. Left ventricular diastolic parameters are consistent with Grade I diastolic dysfunction (impaired relaxation).  2. Right ventricular systolic function is normal. The right ventricular size is normal.  3. The aortic valve is normal in structure. Aortic valve regurgitation is not visualized. Mild aortic valve sclerosis is present, with no evidence of aortic valve stenosis. FINDINGS  Left Ventricle: Left ventricular ejection fraction, by  estimation, is 60 to 65%. The left ventricle has normal function. The left ventricle has no regional wall motion abnormalities. The left ventricular internal cavity size was normal in size. There is  no left ventricular hypertrophy. Left ventricular diastolic parameters are consistent with Grade I diastolic dysfunction (impaired relaxation). Right Ventricle: The right ventricular size is normal. No increase in right ventricular wall thickness. Right ventricular systolic function is normal. Left Atrium: Left atrial size was normal in size. Right Atrium: Right atrial size was normal in size. Pericardium: There is no evidence of pericardial effusion. Mitral Valve: The mitral valve is normal in structure. Normal mobility of the mitral valve leaflets. No evidence of mitral valve regurgitation. No evidence of mitral valve stenosis. MV peak gradient, 11.8 mmHg. The mean mitral valve gradient is 4.0 mmHg. Tricuspid Valve: The tricuspid valve is normal in structure. Tricuspid valve regurgitation is mild . No evidence of tricuspid stenosis. Aortic Valve: The aortic valve is normal in structure. Aortic valve regurgitation is not visualized. Mild aortic valve sclerosis is present, with no evidence of aortic valve stenosis. Aortic valve mean gradient measures 6.0 mmHg. Aortic valve peak gradient measures 12.8 mmHg. Aortic valve area, by VTI measures 1.98 cm. Pulmonic Valve: The pulmonic valve was normal in structure. Pulmonic valve regurgitation is not visualized. No evidence of pulmonic stenosis. Aorta: The aortic root is normal in size and structure. Venous: The inferior vena cava is normal in size with greater than 50% respiratory variability, suggesting right atrial pressure of 3 mmHg. IAS/Shunts: No atrial level shunt detected by color flow Doppler.  LEFT VENTRICLE PLAX 2D LVIDd:  4.16 cm  Diastology LVIDs:         2.84 cm  LV e' lateral:   9.36 cm/s LV PW:         0.94 cm  LV E/e' lateral: 10.9 LV IVS:        0.84  cm  LV e' medial:    12.80 cm/s LVOT diam:     1.90 cm  LV E/e' medial:  8.0 LV SV:         50 LV SV Index:   22 LVOT Area:     2.84 cm  RIGHT VENTRICLE RV Basal diam:  3.27 cm LEFT ATRIUM             Index LA diam:        3.25 cm 1.47 cm/m LA Vol (A2C):   28.8 ml 13.06 ml/m LA Vol (A4C):   35.7 ml 16.19 ml/m LA Biplane Vol: 35.0 ml 15.87 ml/m  AORTIC VALVE                    PULMONIC VALVE AV Area (Vmax):    1.71 cm     PV Vmax:       1.57 m/s AV Area (Vmean):   1.63 cm     PV Vmean:      102.000 cm/s AV Area (VTI):     1.98 cm     PV VTI:        0.226 m AV Vmax:           179.00 cm/s  PV Peak grad:  9.9 mmHg AV Vmean:          116.000 cm/s PV Mean grad:  5.0 mmHg AV VTI:            0.250 m AV Peak Grad:      12.8 mmHg AV Mean Grad:      6.0 mmHg LVOT Vmax:         108.00 cm/s LVOT Vmean:        66.700 cm/s LVOT VTI:          0.175 m LVOT/AV VTI ratio: 0.70  AORTA Ao Root diam: 2.80 cm MITRAL VALVE MV Area (PHT): 5.27 cm     SHUNTS MV Peak grad:  11.8 mmHg    Systemic VTI:  0.18 m MV Mean grad:  4.0 mmHg     Systemic Diam: 1.90 cm MV Vmax:       1.72 m/s MV Vmean:      87.5 cm/s MV Decel Time: 144 msec MV E velocity: 102.00 cm/s MV A velocity: 121.00 cm/s MV E/A ratio:  0.84 Julien Nordmann MD Electronically signed by Julien Nordmann MD Signature Date/Time: 02/25/2020/3:28:11 PM    Final      Assessment/Plan: 54 y.o. female with medical history significant of depression, diabetes, epilepsy, hypertension, hyperlipidemia, history of prior stroke and residual left sided weakness who presents with lethargy and worsening left sided weakness.  On Plavix, ASA and statin at home.  MRI of the brain personally reviewed and shows no acute changes.  Chest x-ray concerning for PNA.  White blood cell count elevated.  Patient started on antibiotics and husband reports that patient is improved today in terms of alertness.  Suspect patient having worsening due to infection-metabolic encephalopathy.  No clinical evidence  of seizure.  Depakote level of 38.    Recommendations: 1. Agree with continued treatment of infection.  Expect patient to continue to improve slowly and may lag somewhat  behind improvement in infection.  2. PT/OT  3. Continue Depakote at current dose 4. Continue Plavix, ASA and statin  No further neurologic intervention is recommended at this time.  If further questions arise, please call or page at that time.  Thank you for allowing neurology to participate in the care of this patient.  Thana Farr, MD Neurology [phone] 02/26/2020, 9:58 AM

## 2020-02-26 NOTE — TOC Initial Note (Signed)
Transition of Care [facility]) - Initial/Assessment Note    Patient Details  Name: [name] MRN: [id number] Date of Birth: 1965-12-13  Transition of Care [facility]) CM/SW Contact:    Maree Krabbe, LCSW Phone Number: 02/26/2020, 3:04 PM  Clinical Narrative:       Pt is only alert to self and place. CSW spoke with pt's Spouse via telephone. Pt's spouse is agreeable for pt to go to SNF and is agreeable to [facility]. CSW will reach out to facility to see if they have a Medicaid bed.            Expected Discharge Plan: Skilled Nursing Facility Barriers to Discharge: Continued Medical Work up   Patient Goals and CMS Choice Patient states their goals for this hospitalization and ongoing recovery are:: for pt get better   Choice offered to / list presented to : Spouse  Expected Discharge Plan and Services Expected Discharge Plan: Skilled Nursing Facility In-house Referral: Clinical Social Work   Post Acute Care Choice: Skilled Nursing Facility Living arrangements for the past 2 months: Single Family Home                                      Prior Living Arrangements/Services Living arrangements for the past 2 months: Single Family Home Lives with:: Spouse Patient language and need for interpreter reviewed:: Yes Do you feel safe going back to the place where you live?: Yes      Need for Family Participation in Patient Care: Yes (Comment) Care giver support system in place?: Yes (comment)   Criminal Activity/Legal Involvement Pertinent to Current Situation/Hospitalization: No - Comment as needed  Activities of Daily Living Home Assistive Devices/Equipment: None ADL Screening (condition at time of admission) Patient's cognitive ability adequate to safely complete daily activities?: No Is the patient deaf or have difficulty hearing?: No Does the patient have difficulty seeing, even when wearing glasses/contacts?: No Does the patient have difficulty concentrating,  remembering, or making decisions?: Yes Patient able to express need for assistance with ADLs?: No Does the patient have difficulty dressing or bathing?: Yes Independently performs ADLs?: No Communication: Needs assistance Is this a change from baseline?: Pre-admission baseline Does the patient have difficulty walking or climbing stairs?: Yes Weakness of Legs: Left Weakness of Arms/Hands: None  Permission Sought/Granted Permission sought to share information with : Family Supports    Share Information with NAME: [name]  Permission granted to share info w AGENCY: [facility]  Permission granted to share info w Relationship: Spouse     Emotional Assessment Appearance:: Appears stated age Attitude/Demeanor/Rapport: Unable to Assess Affect (typically observed): Unable to Assess Orientation: : Oriented to Self, Oriented to Place Alcohol / Substance Use: Not Applicable Psych Involvement: No (comment)  Admission diagnosis:  [name] (transient ischemic attack) [G45.9] Altered mental status [R41.82] Weakness [R53.1] Acute metabolic encephalopathy [G93.41] Patient Active Problem List   Diagnosis Date Noted  . Lobar pneumonia (HCC)   . Weakness 12/20/2019  . Hemiparesis affecting left side as late effect of cerebrovascular accident (CVA) (HCC) 12/20/2019  . Type 2 diabetes mellitus with hyperlipidemia (HCC) 12/20/2019  . Seizure disorder as sequela of cerebrovascular accident (HCC) 12/20/2019  . Urinary incontinence 12/20/2019  . Self-care deficit 12/20/2019  . Fall   . Lacunar stroke, acute (HCC) 07/24/2019  . Seizure (HCC) 07/24/2019  . Elevated troponin 07/24/2019  . AKI (acute kidney injury) (HCC) 07/24/2019  .  Acute metabolic encephalopathy 33/54/5625  . Cerebral infarction ([place]) 08/14/2016  . Recurrent cerebrovascular accidents (CVAs) ([place]) 08/14/2016  . Delirium due to another medical condition 08/13/2016  . Urinary tract infection 08/13/2016  . Right foot infection  05/11/2016  . Foot abscess, right 05/11/2016  . Anxiety and depression 12/02/2015  . CVA (cerebral infarction) 11/27/2015  . Epilepsy ([place]) 06/17/2015  . Complicated migraine 63/89/3734  . Headache 05/19/2015  . Weakness of left side of body 05/19/2015  . History of cardioembolic cerebrovascular accident (CVA) 05/19/2015  . Morbid obesity with BMI of 50.0-59.9, adult ([place]) 03/19/2014  . HTN (hypertension)   . Diabetes mellitus without complication ([place])   . Hyperlipidemia   . [name] (transient ischemic attack) 03/17/2014   PCP:  [facility], [place]:   [place] [address] [place] [address] [place] [address] Phone: [phone] Fax: [phone]     Social Determinants of Health (SDOH) Interventions    Readmission Risk Interventions Readmission Risk Prevention Plan 07/25/2019  Post Dischage Appt Complete  Medication Screening Complete  Transportation Screening Complete  Some recent data might be hidden

## 2020-02-26 NOTE — Progress Notes (Signed)
Patient ID: [name], female   DOB: [birth year], 54 y.o.   MRN: [id number] [facility] PROGRESS NOTE  [name] CBU:[id number] DOB: October 06, 1965 DOA: 02/24/2020 PCP: [facility]  HPI/Subjective: Patient answers some yes or no questions.  Does not elaborate much.  Husband thinks she is a little bit better than yesterday.  Still does fall asleep easily on me while I am talking with her.  Has not eaten much.  Objective: Vitals:   02/26/20 0757 02/26/20 1147  BP: 117/70 138/79  Pulse: (!) 107 (!) 105  Resp: 18 18  Temp: 98.6 F (37 C) 98.6 F (37 C)  SpO2: 96% 96%    Intake/Output Summary (Last 24 hours) at 02/26/2020 1346 Last data filed at 02/26/2020 1245 Gross per 24 hour  Intake 520 ml  Output 200 ml  Net 320 ml   Filed Weights   02/24/20 1420 02/26/20 0417  Weight: 127 kg 127.5 kg    ROS: Review of Systems  Constitutional: Positive for malaise/fatigue. Negative for fever.  Eyes: Negative for blurred vision.  Respiratory: Positive for cough. Negative for shortness of breath.   Cardiovascular: Negative for chest pain.  Gastrointestinal: Negative for abdominal pain, nausea and vomiting.  Genitourinary: Negative for dysuria.  Musculoskeletal: Positive for back pain.  Neurological: Negative for dizziness.   Exam: Physical Exam  Constitutional: She appears lethargic.  HENT:  Nose: No mucosal edema.  Mouth/Throat: No oropharyngeal exudate.  Eyes: Pupils are equal, round, and reactive to light. Conjunctivae and lids are normal.  Cardiovascular: S1 normal and S2 normal. Exam reveals no gallop.  No murmur heard. Respiratory: No respiratory distress. She has decreased breath sounds in the right lower field and the left lower field. She has no wheezes. She has no rhonchi. She has no rales.  GI: Soft. Bowel sounds are normal. There is no abdominal tenderness.  Musculoskeletal:     Right lower leg: Edema present.     Left lower leg: Edema present.   Lymphadenopathy:    She has no cervical adenopathy.  Neurological: She appears lethargic.  Unable to straight leg raise.  Skin: Skin is warm. No rash noted. Nails show no clubbing.  Psychiatric:  Lethargic      Data Reviewed: Basic Metabolic Panel: Recent Labs  Lab 02/24/20 1450 02/26/20 0808  NA 139 138  K 3.3* 3.4*  CL 103 103  CO2 25 24  GLUCOSE 148* 182*  BUN 23* 13  CREATININE 1.16* 0.95  CALCIUM 8.8* 8.4*  MG 1.6*  --    Liver Function Tests: Recent Labs  Lab 02/24/20 1450  AST 15  ALT 13  ALKPHOS 34*  BILITOT 1.1  PROT 7.1  ALBUMIN 3.8   CBC: Recent Labs  Lab 02/24/20 1537 02/26/20 0808  WBC 11.2* 13.4*  NEUTROABS 8.7*  --   HGB 10.6* 9.0*  HCT 34.1* 28.7*  MCV 65.0* 64.1*  PLT 198 165   BNP (last 3 results) Recent Labs    07/23/19 1128  BNP 34.0    CBG: Recent Labs  Lab 02/25/20 1548 02/25/20 2050 02/26/20 0007 02/26/20 0459 02/26/20 0758  GLUCAP 172* 213* 157* 162* 167*    Recent Results (from the past 240 hour(s))  SARS Coronavirus 2 by RT PCR (hospital order, performed in [facility] hospital lab) Nasopharyngeal Nasopharyngeal Swab     Status: None   Collection Time: 02/24/20  6:06 PM   Specimen: Nasopharyngeal Swab  Result Value Ref Range Status   SARS Coronavirus 2  NEGATIVE NEGATIVE Final    Comment: (NOTE) SARS-CoV-2 target nucleic acids are NOT DETECTED. The SARS-CoV-2 RNA is generally detectable in upper and lower respiratory specimens during the acute phase of infection. The lowest concentration of SARS-CoV-2 viral copies this assay can detect is 250 copies / mL. A negative result does not preclude SARS-CoV-2 infection and should not be used as the sole basis for treatment or other patient management decisions.  A negative result may occur with improper specimen collection / handling, submission of specimen other than nasopharyngeal swab, presence of viral mutation(s) within the areas targeted by this assay, and  inadequate number of viral copies (<250 copies / mL). A negative result must be combined with clinical observations, patient history, and epidemiological information. Fact Sheet for Patients:   BoilerBrush.com.cyhttps://www.fda.gov/media/136312/download Fact Sheet for Healthcare Providers: https://pope.com/https://www.fda.gov/media/136313/download This test is not yet approved or cleared  by the [place] FDA and has been authorized for detection and/or diagnosis of SARS-CoV-2 by FDA under an Emergency Use Authorization (EUA).  This EUA will remain in effect (meaning this test can be used) for the duration of the COVID-19 declaration under Section 564(b)(1) of the Act, 21 U.S.C. section 360bbb-3(b)(1), unless the authorization is terminated or revoked sooner. Performed at [facility], [address] [place], [place] [id number]      Studies: CT Head Wo Contrast  Result Date: 02/24/2020 CLINICAL DATA:  Ataxia. Stroke suspected episode of left-sided weakness greater than baseline which has since resolved. EXAM: CT HEAD WITHOUT CONTRAST TECHNIQUE: Contiguous axial images were obtained from the base of the skull through the vertex without intravenous contrast. COMPARISON:  CT head without contrast 12/20/2019 and MR head without contrast 12/21/2019. FINDINGS: Brain: The remote right MCA territory infarct is stable. Ex vacuo dilation of the right lateral ventricle is again noted. Remote ischemic changes are present in the internal capsule. Mild periventricular white matter changes on the left are stable. A remote lacunar infarct in the left paramedian pons is stable. Brainstem and cerebellum are otherwise normal. Vascular: Atherosclerotic changes are again noted within the cavernous internal carotid arteries bilaterally. No hyperdense vessel is present. Skull: Calvarium is intact. No focal lytic or blastic lesions are present. No significant extracranial soft tissue lesion is present. Sinuses/Orbits: The paranasal sinuses  and mastoid air cells are clear. The globes and orbits are within normal limits. IMPRESSION: 1. No acute intracranial abnormality or significant interval change. 2. Stable remote right MCA territory infarct. 3. Remote lacunar infarct in the left paramedian pons. 4. Atherosclerosis. Electronically Signed   By: Marin Robertshristopher  Mattern M.D.   On: 02/24/2020 15:29   MR ANGIO HEAD WO CONTRAST  Result Date: 02/24/2020 CLINICAL DATA:  54 year old female with left side weakness, ataxia. EXAM: MRA HEAD WITHOUT CONTRAST TECHNIQUE: Angiographic images of the Circle of Willis were obtained using MRA technique without intravenous contrast. COMPARISON:  Head CT earlier today. Intracranial MRA 07/24/2019. Brain MRI 12/21/2019. FINDINGS: Study is moderately degraded by motion artifact despite repeated imaging attempts, similar to the November comparison. Antegrade flow in the posterior circulation appears stable with dominant left vertebral artery. The right vertebral probably functionally terminates in PICA. Obscured proximal basilar artery today, but the distal basilar is patent. Basilar tip and PCA origins remain patent. Degraded PCA branch detail, PCAs appear grossly stable since November. Antegrade flow in both ICA siphons, with obscured petrous ICA segments today. Partially obscured supraclinoid segments. Bilateral carotid termini remain patent. Bilateral M1 and A1 segments remain patent. Better visualization of the bilateral ACA  branches today which appear normal. MCA branch detail is obscured similar to the prior exam but there is probably asymmetric right MCA stenosis in the setting of chronic right MCA territory ischemia. The left M1 seems stable and within normal limits. IMPRESSION: 1. Motion degraded intracranial MRA similar to that in November. Follow-up Head And Neck CTA with contrast would be valuable in light of inability to tolerate MRA. 2. No emergent large vessel occlusion suspected. 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 Electronically Signed   By: Genevie Ann M.D.   On: 02/24/2020 22:25   MR BRAIN WO CONTRAST  Result Date: 02/25/2020 CLINICAL DATA:  Focal neurologic deficit EXAM: MRI HEAD WITHOUT CONTRAST TECHNIQUE: Multiplanar, multiecho pulse sequences of the brain and surrounding structures were obtained without intravenous contrast. COMPARISON:  None. FINDINGS: The examination is severely motion degraded. There is no acute ischemia. No midline shift or other mass effect. There is a large area of encephalomalacia in right temporal lobe. IMPRESSION: 1. Severely motion degraded examination. 2. No acute ischemia. 3. Large area of encephalomalacia in the right temporal lobe. Electronically Signed   By: Ulyses Jarred M.D.   On: 02/25/2020 00:10   US Carotid Bilateral (at [facility] and [facility] only)  Result Date: 02/25/2020 CLINICAL DATA:  [name].  Hypertension, hyperlipidemia, diabetes EXAM: BILATERAL CAROTID DUPLEX ULTRASOUND TECHNIQUE: Pearline Cables scale imaging, color Doppler and duplex ultrasound were performed of bilateral carotid and vertebral arteries in the neck. Technologist describes technically difficult study secondary to unresponsive patient with coughing fits. COMPARISON:  07/24/2019 FINDINGS: Criteria: Quantification of carotid stenosis is based on velocity parameters that correlate the residual internal carotid diameter with NASCET-based stenosis levels, using the diameter of the distal internal carotid lumen as the denominator for stenosis measurement. The following velocity measurements were obtained: RIGHT ICA: 57/20 cm/sec CCA: 10/62 cm/sec SYSTOLIC ICA/CCA RATIO:  0.7 ECA: 320 cm/sec LEFT ICA: 87/27 cm/sec CCA: 694/85 cm/sec SYSTOLIC ICA/CCA RATIO:  0.5 ECA: 166 cm/sec RIGHT CAROTID ARTERY: Partially calcified plaque in the bulb, extending into proximal ECA and ICA. Elevated peak systolic velocities just beyond the ECA origin. Normal waveforms and color  Doppler signal in the ICA, without high-grade stenosis evident. RIGHT VERTEBRAL ARTERY:  Normal flow direction and waveform. LEFT CAROTID ARTERY: Eccentric partially calcified plaque in the bulb and ICA origin, without high-grade stenosis. Normal waveforms and color Doppler signal. Mild tortuosity. LEFT VERTEBRAL ARTERY:  Normal flow direction and waveform. IMPRESSION: 1. Bilateral carotid bifurcation plaque resulting in less than 50% diameter ICA stenosis. 2. 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 3. Antegrade bilateral vertebral arterial flow. Electronically Signed   By: Lucrezia Europe M.D.   On: 02/25/2020 09:54   DG Chest Port 1 View  Result Date: 02/25/2020 CLINICAL DATA:  Altered mental status.  Lethargic. EXAM: PORTABLE CHEST 1 VIEW COMPARISON:  Chest x-ray 02/24/2020 FINDINGS: The heart is enlarged but stable. Stable tortuosity of the thoracic aorta. Persistent left basilar density, atelectasis versus infiltrate. No definite pleural effusions. No worrisome pulmonary lesions. IMPRESSION: Persistent left basilar density, atelectasis versus infiltrate. Electronically Signed   By: Marijo Sanes M.D.   On: 02/25/2020 13:11   DG Chest Portable 1 View  Result Date: 02/24/2020 CLINICAL DATA:  Weakness, facial droop, slurred speech EXAM: PORTABLE CHEST 1 VIEW COMPARISON:  12/20/2019 FINDINGS: Single frontal view of the chest demonstrates an unremarkable cardiac silhouette. There is increased density in the retrocardiac region which may  reflect atelectasis or airspace disease. No effusion or pneumothorax. No acute bony abnormalities. IMPRESSION: 1. New retrocardiac consolidation compatible with airspace disease or atelectasis. Electronically Signed   By: Sharlet Salina M.D.   On: 02/24/2020 17:24   ECHOCARDIOGRAM COMPLETE  Result Date: 02/25/2020    ECHOCARDIOGRAM REPORT   Patient Name:   ROSABEL SERMENO Date of Exam: 02/25/2020 Medical Rec #:  [id number]  Height:        62.0 in Accession #:    [id number] Weight:       280.0 lb Date of Birth:  [birth year]  BSA:          2.206 m Patient Age:    53 years   BP:           174/92 mmHg Patient Gender: F          HR:           97 bpm. Exam Location:  [facility] Procedure: 2D Echo, Limited Color Doppler and Cardiac Doppler Indications:     G45.9 [name]  History:         Patient has prior history of Echocardiogram examinations, most                  recent 07/24/2019. Stroke; Risk Factors:Dyslipidemia, Diabetes                  and Hypertension.  Sonographer:     Humphrey Rolls RDCS (AE) Referring Phys:  Kenn File DOUTOVA Diagnosing Phys: Julien Nordmann MD  Sonographer Comments: Image acquisition challenging due to patient body habitus. IMPRESSIONS  1. Left ventricular ejection fraction, by estimation, is 60 to 65%. The left ventricle has normal function. The left ventricle has no regional wall motion abnormalities. Left ventricular diastolic parameters are consistent with Grade I diastolic dysfunction (impaired relaxation).  2. Right ventricular systolic function is normal. The right ventricular size is normal.  3. The aortic valve is normal in structure. Aortic valve regurgitation is not visualized. Mild aortic valve sclerosis is present, with no evidence of aortic valve stenosis. FINDINGS  Left Ventricle: Left ventricular ejection fraction, by estimation, is 60 to 65%. The left ventricle has normal function. The left ventricle has no regional wall motion abnormalities. The left ventricular internal cavity size was normal in size. There is  no left ventricular hypertrophy. Left ventricular diastolic parameters are consistent with Grade I diastolic dysfunction (impaired relaxation). Right Ventricle: The right ventricular size is normal. No increase in right ventricular wall thickness. Right ventricular systolic function is normal. Left Atrium: Left atrial size was normal in size. Right Atrium: Right atrial size was normal in size. Pericardium:  There is no evidence of pericardial effusion. Mitral Valve: The mitral valve is normal in structure. Normal mobility of the mitral valve leaflets. No evidence of mitral valve regurgitation. No evidence of mitral valve stenosis. MV peak gradient, 11.8 mmHg. The mean mitral valve gradient is 4.0 mmHg. Tricuspid Valve: The tricuspid valve is normal in structure. Tricuspid valve regurgitation is mild . No evidence of tricuspid stenosis. Aortic Valve: The aortic valve is normal in structure. Aortic valve regurgitation is not visualized. Mild aortic valve sclerosis is present, with no evidence of aortic valve stenosis. Aortic valve mean gradient measures 6.0 mmHg. Aortic valve peak gradient measures 12.8 mmHg. Aortic valve area, by VTI measures 1.98 cm. Pulmonic Valve: The pulmonic valve was normal in structure. Pulmonic valve regurgitation is not visualized. No evidence of pulmonic stenosis. Aorta: The aortic root is normal in size  and structure. Venous: The inferior vena cava is normal in size with greater than 50% respiratory variability, suggesting right atrial pressure of 3 mmHg. IAS/Shunts: No atrial level shunt detected by color flow Doppler.  LEFT VENTRICLE PLAX 2D LVIDd:         4.16 cm  Diastology LVIDs:         2.84 cm  LV e' lateral:   9.36 cm/s LV PW:         0.94 cm  LV E/e' lateral: 10.9 LV IVS:        0.84 cm  LV e' medial:    12.80 cm/s LVOT diam:     1.90 cm  LV E/e' medial:  8.0 LV SV:         50 LV SV Index:   22 LVOT Area:     2.84 cm  RIGHT VENTRICLE RV Basal diam:  3.27 cm LEFT ATRIUM             Index LA diam:        3.25 cm 1.47 cm/m LA Vol (A2C):   28.8 ml 13.06 ml/m LA Vol (A4C):   35.7 ml 16.19 ml/m LA Biplane Vol: 35.0 ml 15.87 ml/m  AORTIC VALVE                    PULMONIC VALVE AV Area (Vmax):    1.71 cm     PV Vmax:       1.57 m/s AV Area (Vmean):   1.63 cm     PV Vmean:      102.000 cm/s AV Area (VTI):     1.98 cm     PV VTI:        0.226 m AV Vmax:           179.00 cm/s  PV Peak  grad:  9.9 mmHg AV Vmean:          116.000 cm/s PV Mean grad:  5.0 mmHg AV VTI:            0.250 m AV Peak Grad:      12.8 mmHg AV Mean Grad:      6.0 mmHg LVOT Vmax:         108.00 cm/s LVOT Vmean:        66.700 cm/s LVOT VTI:          0.175 m LVOT/AV VTI ratio: 0.70  AORTA Ao Root diam: 2.80 cm MITRAL VALVE MV Area (PHT): 5.27 cm     SHUNTS MV Peak grad:  11.8 mmHg    Systemic VTI:  0.18 m MV Mean grad:  4.0 mmHg     Systemic Diam: 1.90 cm MV Vmax:       1.72 m/s MV Vmean:      87.5 cm/s MV Decel Time: 144 msec MV E velocity: 102.00 cm/s MV A velocity: 121.00 cm/s MV E/A ratio:  0.84 Julien Nordmann MD Electronically signed by Julien Nordmann MD Signature Date/Time: 02/25/2020/3:28:11 PM    Final     Scheduled Meds: .  stroke: mapping our early stages of recovery book   Does not apply Once  . [START ON 02/27/2020] aspirin  81 mg Oral Daily  . atorvastatin  40 mg Oral q1800  . azithromycin  250 mg Oral Daily  . clopidogrel  75 mg Oral Daily  . divalproex  500 mg Oral QHS   And  . divalproex  250 mg Oral q AM  . enoxaparin (LOVENOX) injection  40 mg Subcutaneous Q12H  . insulin aspart  0-9 Units Subcutaneous Q4H  . insulin detemir  35 Units Subcutaneous Daily   Continuous Infusions: . sodium chloride 75 mL/hr at 02/26/20 1228  . cefTRIAXone (ROCEPHIN)  IV 2 g (02/25/20 1801)    Assessment/Plan:  1. Acute metabolic encephalopathy. MRI of the brain does not show any signs of acute stroke.  Depakote level is normal. Could be infection related with pneumonia.  Gentle fluids.  We will get swallow evaluation.  Hold Neurontin for now 2. Retrocardiac pneumonia on Rocephin and Zithromax 3. Acute kidney injury and dehydration.  Gentle fluids.  Creatinine improved with fluids 4. Weakness. Unable to straight leg raise. Physical therapy evaluation recommended rehab. 5. Prior nonhemorrhagic stroke with encephalomalacia. On aspirin, Plavix and atorvastatin. 6. History of seizure disorder on  Depakote. 7. Type 2 diabetes mellitus with hyperlipidemia on detemir insulin and sliding scale 8. Morbid obesity with a BMI of 51.40.  Code Status:     Code Status Orders  (From admission, onward)         Start     Ordered   02/24/20 2241  Full code  Continuous        02/24/20 2240        Code Status History    Date Active Date Inactive Code Status Order ID Comments User Context   12/20/2019 2352 12/22/2019 2046 Full Code [id number]  Andris Baumann, MD ED   07/23/2019 1759 07/25/2019 1954 Full Code [id number]  Rolly Salter, MD ED   04/16/2019 1554 04/17/2019 1746 Full Code [id number]  Auburn Bilberry, MD Inpatient   08/14/2016 2201 08/16/2016 2245 Full Code [id number]  Michael Litter, MD Inpatient   05/11/2016 1729 05/15/2016 1514 Full Code [id number]  Katharina Caper, MD Inpatient   11/27/2015 1235 11/28/2015 1626 Full Code [id number]  Houston Siren, MD ED   06/14/2015 0620 06/15/2015 2214 Full Code [id number]  Arnaldo Natal, MD Inpatient   05/19/2015 0111 05/19/2015 2041 Full Code [id number]  Crissie Figures, MD Inpatient   Advance Care Planning Activity     Family Communication: Husband at the bedside Disposition Plan: Status is: Inpatient   Dispo: The patient is from: Home              Anticipated d/c is to: Rehab versus home with home health              Anticipated d/c date is: Likely will need a few days of working with physical therapy.              Patient currently with altered mental status and unable to walk.  Chest x-ray concerning for pneumonia and antibiotics were started yesterday.  Patient's mental status is still not back to her baseline.  Patient unable to walk at this point in time.  Consultants:  Neurology  Antibiotics:  New Rocephin and Zithromax  Time spent: 27 minutes  Lanny [employer]

## 2020-02-26 NOTE — Plan of Care (Signed)
°  Problem: Activity: Goal: Risk for activity intolerance will decrease Outcome: Not Progressing Note: Patient still very weak unable to get out of bed.

## 2020-02-26 NOTE — Progress Notes (Signed)
eeg completed ° °

## 2020-02-26 NOTE — Progress Notes (Addendum)
Upon assessment patient does not have iv fluids infusing and patient does not have any peripheral IV access. IV team consult put in. Patient alert but disoriented. NIHSS completed, neurology at bedside.   Update 1530: per another nursing staff, patient pulled out IV. IV team consult put in. Midline placed by IV team, per IV team patient has very small veins so safety mitts were put on patient to prevent her from pulling out the midline. She has pulled out several according to report.

## 2020-02-26 NOTE — Plan of Care (Signed)
  Problem: Safety: Goal: Ability to remain free from injury will improve Outcome: Progressing   

## 2020-02-26 NOTE — Procedures (Signed)
ELECTROENCEPHALOGRAM REPORT   Patient: [name]       Room #: 234A-AA EEG No. ID: [id number] Age: 54 y.o.        Sex: female Requesting Physician: Renae Gloss Report Date:  02/26/2020        Interpreting Physician: Thana Farr  History: Daelyn Pettaway is an 54 y.o. female with altered mental status  Medications:  Depakote, Insulin, ASA, Lipitor, Rocephin, Zithromax  Conditions of Recording:  This is a 21 channel routine scalp EEG performed with bipolar and monopolar montages arranged in accordance to the international 10/20 system of electrode placement. One channel was dedicated to EKG recording.  The patient is in the lethargic state.  Description:  The background activity is slow and poorly organized.  This activity Iow voltage and consists of activity in the delta-theta continuum.   This activity is diffusely distributed and continuous. No epileptiform activity is noted.  Hyperventilation was not performed.  Intermittent photic stimulation was performed but failed to illicit any change in the tracing.    IMPRESSION: This is an abnormal EEG secondary to general background slowing.  This finding may be seen with a diffuse disturbance that is etiologically nonspecific, but may include a metabolic encephalopathy, among other possibilities.  No epileptiform activity is noted.     Thana Farr, MD Neurology [phone] 02/26/2020, 11:33 PM

## 2020-02-26 NOTE — NC FL2 (Signed)
[place] LEVEL OF CARE SCREENING TOOL     IDENTIFICATION  Patient Name: [name] Birthdate: Jun 10, 1966 Sex: female Admission Date (Current Location): 02/24/2020  [place] and [place] Number:  Engineering geologist and Address:  [facility], [address], [place], [place] [address]      Provider Number: [id number]  Attending Physician Name and Address:  Loletha Grayer, MD  Relative Name and Phone Number:       Current Level of Care: Hospital Recommended Level of Care: [place] Prior Approval Number:    Date Approved/Denied:   PASRR Number: [id number]  Discharge Plan: SNF    Current Diagnoses: Patient Active Problem List   Diagnosis Date Noted  . Lobar pneumonia ([facility])   . Weakness 12/20/2019  . Hemiparesis affecting left side as late effect of cerebrovascular accident (CVA) ([place]) 12/20/2019  . Type 2 diabetes mellitus with hyperlipidemia ([place]) 12/20/2019  . Seizure disorder as sequela of cerebrovascular accident ([place]) 12/20/2019  . Urinary incontinence 12/20/2019  . Self-care deficit 12/20/2019  . Fall   . Lacunar stroke, acute ([place]) 07/24/2019  . Seizure ([place]) 07/24/2019  . Elevated troponin 07/24/2019  . AKI (acute kidney injury) ([place]) 07/24/2019  . Acute metabolic encephalopathy 56/21/3086  . Cerebral infarction ([place]) 08/14/2016  . Recurrent cerebrovascular accidents (CVAs) ([place]) 08/14/2016  . Delirium due to another medical condition 08/13/2016  . Urinary tract infection 08/13/2016  . Right foot infection 05/11/2016  . Foot abscess, right 05/11/2016  . Anxiety and depression 12/02/2015  . CVA (cerebral infarction) 11/27/2015  . Epilepsy ([place]) 06/17/2015  . Complicated migraine 57/84/6962  . Headache 05/19/2015  . Weakness of left side of body 05/19/2015  . History of cardioembolic cerebrovascular accident (CVA) 05/19/2015  . Morbid obesity with BMI of 50.0-59.9, adult ([place]) 03/19/2014  . HTN  (hypertension)   . Diabetes mellitus without complication ([place])   . Hyperlipidemia   . [name] (transient ischemic attack) 03/17/2014    Orientation RESPIRATION BLADDER Height & Weight     Self, Place  Normal External catheter, Incontinent (placed 6/11) Weight: 281 lb 0.7 oz (127.5 kg) Height:  5\' 2"  (157.5 cm)  BEHAVIORAL SYMPTOMS/MOOD NEUROLOGICAL BOWEL NUTRITION STATUS      Continent Diet (heart healthy/carb modified)  AMBULATORY STATUS COMMUNICATION OF NEEDS Skin   Limited Assist Verbally Normal                       Personal Care Assistance Level of Assistance  Bathing, Feeding, Dressing Bathing Assistance: Limited assistance Feeding assistance: Independent Dressing Assistance: Limited assistance     Functional Limitations Info  Sight, Speech, Hearing Sight Info: Adequate Hearing Info: Adequate Speech Info: Adequate    SPECIAL CARE FACTORS FREQUENCY  PT (By licensed PT), OT (By licensed OT)     PT Frequency: 5x OT Frequency: 5x            Contractures Contractures Info: Not present    Additional Factors Info  Code Status, Allergies Code Status Info: Full Code Allergies Info: No known allergies           Current Medications (02/26/2020):  This is the current hospital active medication list Current Facility-Administered Medications  Medication Dose Route Frequency Provider Last Rate Last Admin  .  stroke: mapping our early stages of recovery book   Does not apply Once Doutova, Anastassia, MD      . 0.9 %  sodium chloride infusion   Intravenous Continuous Wieting, Richard,  MD      . acetaminophen (TYLENOL) tablet 650 mg  650 mg Oral Q4H PRN Therisa Doyne, MD       Or  . acetaminophen (TYLENOL) 160 MG/5ML solution 650 mg  650 mg Per Tube Q4H PRN Doutova, Anastassia, MD       Or  . acetaminophen (TYLENOL) suppository 650 mg  650 mg Rectal Q4H PRN Therisa Doyne, MD      . Melene Muller ON 02/27/2020] aspirin EC tablet 81 mg  81 mg Oral Daily Wieting,  Richard, MD      . atorvastatin (LIPITOR) tablet 40 mg  40 mg Oral q1800 Doutova, Anastassia, MD      . azithromycin (ZITHROMAX) tablet 250 mg  250 mg Oral Daily Alford Highland, MD   250 mg at 02/26/20 0914  . cefTRIAXone (ROCEPHIN) 2 g in sodium chloride 0.9 % 100 mL IVPB  2 g Intravenous Q24H Alford Highland, MD 200 mL/hr at 02/25/20 1801 2 g at 02/25/20 1801  . clopidogrel (PLAVIX) tablet 75 mg  75 mg Oral Daily Alford Highland, MD   75 mg at 02/26/20 1421  . divalproex (DEPAKOTE) DR tablet 500 mg  500 mg Oral QHS Albina Billet, RPH   500 mg at 02/25/20 2117   And  . divalproex (DEPAKOTE) DR tablet 250 mg  250 mg Oral q AM Albina Billet, RPH   250 mg at 02/26/20 9323  . enoxaparin (LOVENOX) injection 40 mg  40 mg Subcutaneous Q12H Alford Highland, MD   40 mg at 02/26/20 0915  . insulin aspart (novoLOG) injection 0-9 Units  0-9 Units Subcutaneous Q4H Therisa Doyne, MD   2 Units at 02/26/20 1226  . insulin detemir (LEVEMIR) injection 35 Units  35 Units Subcutaneous Daily Therisa Doyne, MD   35 Units at 02/26/20 0915     Discharge Medications: Please see discharge summary for a list of discharge medications.  Relevant Imaging Results:  Relevant Lab Results:   Additional Information SSN:[id number]  Reuel Boom Alias Villagran, LCSW

## 2020-02-27 ENCOUNTER — Encounter: Payer: Self-pay | Admitting: Internal Medicine

## 2020-02-27 ENCOUNTER — Inpatient Hospital Stay: Payer: Medicaid Other

## 2020-02-27 DIAGNOSIS — R0602 Shortness of breath: Secondary | ICD-10-CM

## 2020-02-27 DIAGNOSIS — Z6841 Body Mass Index (BMI) 40.0 and over, adult: Secondary | ICD-10-CM

## 2020-02-27 LAB — BASIC METABOLIC PANEL
Anion gap: 13 (ref 5–15)
BUN: 14 mg/dL (ref 6–20)
CO2: 25 mmol/L (ref 22–32)
Calcium: 8.6 mg/dL — ABNORMAL LOW (ref 8.9–10.3)
Chloride: 102 mmol/L (ref 98–111)
Creatinine, Ser: 0.9 mg/dL (ref 0.44–1.00)
GFR calc Af Amer: >60 mL/min (ref >60)
GFR calc non Af Amer: >60 mL/min (ref >60)
Glucose, Bld: 160 mg/dL — ABNORMAL HIGH (ref 70–99)
Potassium: 3.4 mmol/L — ABNORMAL LOW (ref 3.5–5.1)
Sodium: 140 mmol/L (ref 135–145)

## 2020-02-27 LAB — GLUCOSE, CAPILLARY
Glucose-Capillary: 136 mg/dL — ABNORMAL HIGH (ref 70–99)
Glucose-Capillary: 139 mg/dL — ABNORMAL HIGH (ref 70–99)
Glucose-Capillary: 140 mg/dL — ABNORMAL HIGH (ref 70–99)
Glucose-Capillary: 145 mg/dL — ABNORMAL HIGH (ref 70–99)
Glucose-Capillary: 169 mg/dL — ABNORMAL HIGH (ref 70–99)
Glucose-Capillary: 180 mg/dL — ABNORMAL HIGH (ref 70–99)

## 2020-02-27 LAB — MAGNESIUM: Magnesium: 1.8 mg/dL (ref 1.7–2.4)

## 2020-02-27 MED ORDER — IOHEXOL 350 MG/ML SOLN
100.0000 mL | Freq: Once | INTRAVENOUS | Status: AC | PRN
  Administered 2020-02-27: 09:00:00 100 mL via INTRAVENOUS

## 2020-02-27 MED ORDER — ORAL CARE MOUTH RINSE
15.0000 mL | Freq: Two times a day (BID) | OROMUCOSAL | Status: DC
  Administered 2020-02-27 – 2020-03-03 (×9): 15 mL via OROMUCOSAL

## 2020-02-27 MED ORDER — METOPROLOL TARTRATE 25 MG PO TABS
12.5000 mg | ORAL_TABLET | Freq: Two times a day (BID) | ORAL | Status: DC
  Administered 2020-02-27 – 2020-02-29 (×6): 12.5 mg via ORAL
  Filled 2020-02-27 (×6): qty 1

## 2020-02-27 NOTE — Progress Notes (Signed)
PT Cancellation Note  Patient Details Name: [name] MRN: [id number] DOB: 09-Nov-1965   Cancelled Treatment:    Reason Eval/Treat Not Completed: Fatigue/lethargy limiting ability to participate   Ezekiel Ina, PT DPT 02/27/2020, 2:06 PM

## 2020-02-27 NOTE — Progress Notes (Signed)
   02/27/20 0802  Assess: MEWS Score  Temp 98.5 F (36.9 C)  BP 138/76  Pulse Rate 96  ECG Heart Rate 96  Resp (!) 43  Level of Consciousness Alert  SpO2 100 %  O2 Device Nasal Cannula  O2 Flow Rate (L/min) 2 L/min  Assess: MEWS Score  MEWS Temp 0  MEWS Systolic 0  MEWS Pulse 0  MEWS RR 3  MEWS LOC 0  MEWS Score 3  MEWS Score Color Yellow  Assess: if the MEWS score is Yellow or Red  Were vital signs taken at a resting state? Yes  Focused Assessment Documented focused assessment  Early Detection of Sepsis Score *See Row Information* High  MEWS guidelines implemented *See Row Information* Yes  Treat  MEWS Interventions Other (Comment) (notifed MD )  Take Vital Signs  Increase Vital Sign Frequency  Yellow: Q 2hr X 2 then Q 4hr X 2, if remains yellow, continue Q 4hrs  Escalate  MEWS: Escalate Yellow: discuss with charge nurse/RN and consider discussing with provider and RRT  Notify: Charge Nurse/RN  Name of Charge Nurse/RN Notified [email or web address]   Date Charge Nurse/RN Notified 02/27/20  Time Charge Nurse/RN Notified 0805  Notify: Provider  Provider Name/Title Wieting   Date Provider Notified 02/27/20  Time Provider Notified [phone]  Notification Type Page  Notification Reason Other (Comment) (mews change green to yellow increase RR )  Response Other (Comment) (MD to bedsdide )  Date of Provider Response 02/27/20  Time of Provider Response 0820  CT ordered continue to follow mews protocol

## 2020-02-27 NOTE — Progress Notes (Signed)
OT Cancellation Note  Patient Details Name: Analissa Bayless MRN: [id number] DOB: 07-17-1966   Cancelled Treatment:    Reason Eval/Treat Not Completed: Fatigue/lethargy limiting ability to participate. OT consult received and chart reviewed. Upon arrival, pt asleep, snoring c noticeable amount of drool. Pt not easily aroused to light touch or face washing. Will attempt at a later date when pt able to participate in physical activity.   Kathie Dike, M.S. OTR/L  02/27/20, 3:43 PM

## 2020-02-27 NOTE — Progress Notes (Signed)
Pt and husband made aware of pt transferring to oncology rm 104.

## 2020-02-27 NOTE — Progress Notes (Signed)
   02/27/20 1200  Vitals  BP (!) 168/89  Notified MD wieting , BP trending up. PRN's?

## 2020-02-27 NOTE — Progress Notes (Deleted)
   02/27/20 0802  Vitals  Temp 98.5 F (36.9 C)  Temp Source Oral  BP 138/76  MAP (mmHg) 95  BP Location Left Arm  BP Method Automatic  Patient Position (if appropriate) Lying  Pulse Rate 96  Pulse Rate Source Monitor  ECG Heart Rate 96  Resp (!) 43  Level of Consciousness  Level of Consciousness Alert  Oxygen Therapy  SpO2 100 %  O2 Device Nasal Cannula  O2 Flow Rate (L/min) 2 L/min  Pain Assessment  Pain Scale 0-10  Pain Score 0  MEWS Score  MEWS Temp 0  MEWS Systolic 0  MEWS Pulse 0  MEWS RR 3  MEWS LOC 0  MEWS Score 3  MEWS Score Color Yellow  Provider Notification  Provider Name/Title Wieting   Date Provider Notified 02/27/20  Time Provider Notified 0806  Notification Type Page  Notification Reason Other (Comment) (mews change green to yellow increase RR )  Response Other (Comment) (MD to bedsdide )  Date of Provider Response 02/27/20  Time of Provider Response 0820  Chest CT ordered, continue to monitor and follow Mews protocol

## 2020-02-27 NOTE — Evaluation (Signed)
Clinical/Bedside Swallow Evaluation Patient Details  Name: [name] MRN: [id number] Date of Birth: 02/04/1966  Today's Date: 02/27/2020 Time: SLP Start Time (ACUTE ONLY): 1200 SLP Stop Time (ACUTE ONLY): 1254 SLP Time Calculation (min) (ACUTE ONLY): 54 min  Past Medical History:  Past Medical History:  Diagnosis Date  . Allergic rhinitis   . Chickenpox   . Depression   . Diabetes (H[facility])   . Epilepsy ([facility])   . Headache   . HTN (hypertension)   . Hyperlipidemia   . Seizures ([facility])   . Stroke ([facility])   . Urinary incontinence    Past Surgical History:  Past Surgical History:  Procedure Laterality Date  . CESAREAN SECTION    . INCISION AND DRAINAGE Right 05/13/2016   Procedure: INCISION AND DRAINAGE;  Surgeon: Gwyneth Revels, DPM;  Location: [facility] ORS;  Service: Podiatry;  Laterality: Right;   HPI:  [name] is a 54 y.o. female with past history of strokes (03/2014;  11/2015; 07/2019), history of hypertension, diabetes mellitus, hyperlipidemia, depression, epilepsy, hyperlipidemia. Pt with previous ST cognitive linguistic evaluation 08/14/2016 which identified moderate cognitive deficits in the areas of problem solving, task initiation, awareness and attention.    Assessment / Plan / Recommendation Clinical Impression  Bedside swallow eval today was limited as Pt started coughing once placed in an upright position and continued to cough for several minutes without being given any PO's. Afterwards, her HR was elevated and she became very fatigued. Pt needed several minutes of rest to return to normal HR and improved breathing. This occurred several times while ST was in the room again without any PO's. Eventually, Pt was given a few bites of applesauce and sips of nectar thick liquid. No immediate s/s of aspiration were noted. Pt did have some difficulty creating a seal around the cup and taking in the nectar thick liquid. Vocal quality remained clear. No other Po's given as Pt fatigued easily  and began falling asleep. Will alter diet to Dys 1 with honey thick liquids for now. Will request Nsg hold tray if Pt is having any coughing spells or is lethargic. Meds to be given in applesauce, crush if needed. ST to follow up with toleration of diet and alter as needed. SLP Visit Diagnosis: Dysphagia, oropharyngeal phase (R13.12)    Aspiration Risk  Moderate aspiration risk    Diet Recommendation Dysphagia 1 (Puree);Honey-thick liquid   Medication Administration: Whole meds with puree Supervision: Full supervision/cueing for compensatory strategies Compensations: Slow rate;Small sips/bites;Minimize environmental distractions;Other (Comment) (Do not feed if pt is having coughing episodes) Postural Changes: Remain upright for at least 30 minutes after po intake;Seated upright at 90 degrees    Other  Recommendations Oral Care Recommendations: Oral care BID   Follow up Recommendations Skilled Nursing facility      Frequency and Duration min 2x/week  1 week       Prognosis Prognosis for Safe Diet Advancement: Guarded      Swallow Study   General Date of Onset: 02/24/20 HPI: [name] is a 54 y.o. female with past history of strokes (03/2014;  11/2015; 07/2019), history of hypertension, diabetes mellitus, hyperlipidemia, depression, epilepsy, hyperlipidemia. Pt with previous ST cognitive linguistic evaluation 08/14/2016 which identified moderate cognitive deficits in the areas of problem solving, task initiation, awareness and attention.  Type of Study: Bedside Swallow Evaluation Diet Prior to this Study: Regular Temperature Spikes Noted: No Respiratory Status: Nasal cannula History of Recent Intubation: No Behavior/Cognition: Lethargic/Drowsy Oral Cavity Assessment: Within Functional Limits Oral  Care Completed by SLP: No Oral Cavity - Dentition: Poor condition;Missing dentition Self-Feeding Abilities: Total assist Patient Positioning: Upright in bed Baseline Vocal Quality:  Normal Volitional Cough: Strong Volitional Swallow: Able to elicit    Oral/Motor/Sensory Function Overall Oral Motor/Sensory Function: Mild impairment   Ice Chips Ice chips: Impaired Presentation: Spoon Pharyngeal Phase Impairments: Cough - Delayed   Thin Liquid Thin Liquid: Not tested    Nectar Thick Nectar Thick Liquid: Impaired Presentation: Cup;Spoon   Honey Thick Honey Thick Liquid: Not tested   Puree Puree: Within functional limits Presentation: Spoon   Solid     Solid: Not tested      Lucila Maine 02/27/2020,12:54 PM

## 2020-02-27 NOTE — Progress Notes (Signed)
Patient ID: [name], female   DOB: 1965-10-05, 54 y.o.   MRN: [id number] [facility] PROGRESS NOTE  [name] WUJ:[id number] DOB: 1966-03-23 DOA: 02/24/2020 PCP: [facility]  HPI/Subjective: Called to see patient for change in mews score.  Patient with shallow breathing.  Still with cough. ansers yes or no questions but does not elaborate more.  Objective: Vitals:   02/27/20 0742 02/27/20 0802  BP: (!) 148/87 138/76  Pulse: 96 96  Resp: (!) 22 (!) 43  Temp: 99.6 F (37.6 C) 98.5 F (36.9 C)  SpO2: 100% 100%    Intake/Output Summary (Last 24 hours) at 02/27/2020 0827 Last data filed at 02/27/2020 0400 Gross per 24 hour  Intake 922.55 ml  Output 750 ml  Net 172.55 ml   Filed Weights   02/24/20 1420 02/26/20 0417 02/27/20 0358  Weight: 127 kg 127.5 kg 110.9 kg    ROS: Review of Systems  Constitutional: Positive for malaise/fatigue. Negative for fever.  Eyes: Negative for blurred vision.  Respiratory: Positive for cough. Negative for shortness of breath.   Cardiovascular: Negative for chest pain.  Gastrointestinal: Negative for abdominal pain, nausea and vomiting.  Genitourinary: Negative for dysuria.  Musculoskeletal: Positive for back pain.  Neurological: Negative for dizziness.   Exam: Physical Exam  Constitutional: She appears lethargic.  HENT:  Nose: No mucosal edema.  Mouth/Throat: No oropharyngeal exudate.  Eyes: Pupils are equal, round, and reactive to light. Conjunctivae and lids are normal.  Cardiovascular: S1 normal and S2 normal. Exam reveals no gallop.  No murmur heard. Respiratory: No respiratory distress. She has decreased breath sounds in the right lower field and the left lower field. She has no wheezes. She has no rhonchi. She has no rales.  GI: Soft. Bowel sounds are normal. There is no abdominal tenderness.  Musculoskeletal:     Right lower leg: Edema present.     Left lower leg: Edema present.  Lymphadenopathy:     She has no cervical adenopathy.  Neurological: She appears lethargic.  Unable to straight leg raise.  Skin: Skin is warm. No rash noted. Nails show no clubbing.  Psychiatric:  Lethargic      Data Reviewed: Basic Metabolic Panel: Recent Labs  Lab 02/24/20 1450 02/26/20 0808 02/27/20 0712  NA 139 138 140  K 3.3* 3.4* 3.4*  CL 103 103 102  CO2 25 24 25   GLUCOSE 148* 182* 160*  BUN 23* 13 14  CREATININE 1.16* 0.95 0.90  CALCIUM 8.8* 8.4* 8.6*  MG 1.6*  --  1.8   Liver Function Tests: Recent Labs  Lab 02/24/20 1450  AST 15  ALT 13  ALKPHOS 34*  BILITOT 1.1  PROT 7.1  ALBUMIN 3.8   CBC: Recent Labs  Lab 02/24/20 1537 02/26/20 0808  WBC 11.2* 13.4*  NEUTROABS 8.7*  --   HGB 10.6* 9.0*  HCT 34.1* 28.7*  MCV 65.0* 64.1*  PLT 198 165   BNP (last 3 results) Recent Labs    07/23/19 1128  BNP 34.0    CBG: Recent Labs  Lab 02/26/20 1626 02/26/20 1942 02/27/20 0009 02/27/20 0408 02/27/20 0739  GLUCAP 211* 172* 139* 145* 140*    Recent Results (from the past 240 hour(s))  SARS Coronavirus 2 by RT PCR (hospital order, performed in [facility] hospital lab) Nasopharyngeal Nasopharyngeal Swab     Status: None   Collection Time: 02/24/20  6:06 PM   Specimen: Nasopharyngeal Swab  Result Value Ref Range Status   SARS  Coronavirus 2 NEGATIVE NEGATIVE Final    Comment: (NOTE) SARS-CoV-2 target nucleic acids are NOT DETECTED. The SARS-CoV-2 RNA is generally detectable in upper and lower respiratory specimens during the acute phase of infection. The lowest concentration of SARS-CoV-2 viral copies this assay can detect is 250 copies / mL. A negative result does not preclude SARS-CoV-2 infection and should not be used as the sole basis for treatment or other patient management decisions.  A negative result may occur with improper specimen collection / handling, submission of specimen other than nasopharyngeal swab, presence of viral mutation(s) within the areas  targeted by this assay, and inadequate number of viral copies (<250 copies / mL). A negative result must be combined with clinical observations, patient history, and epidemiological information. Fact Sheet for Patients:   BoilerBrush.com.cyhttps://www.fda.gov/media/136312/download Fact Sheet for Healthcare Providers: https://pope.com/https://www.fda.gov/media/136313/download This test is not yet approved or cleared  by the [place] FDA and has been authorized for detection and/or diagnosis of SARS-CoV-2 by FDA under an Emergency Use Authorization (EUA).  This EUA will remain in effect (meaning this test can be used) for the duration of the COVID-19 declaration under Section 564(b)(1) of the Act, 21 U.S.C. section 360bbb-3(b)(1), unless the authorization is terminated or revoked sooner. Performed at [facility], [address] [place], [place] [address]      Studies: EEG  Result Date: 02/26/2020 [name], Leslie, MD     02/26/2020 11:38 PM ELECTROENCEPHALOGRAM REPORT Patient: [name]       Room #: 234A-AA EEG No. ID: [id number] Age: 54 y.o.        Sex: female Requesting Physician: [name] Report Date:  02/26/2020       Interpreting Physician: [name], [name] History: [name] is an 54 y.o. female with altered mental status Medications: Depakote, Insulin, ASA, Lipitor, Rocephin, Zithromax Conditions of Recording:  This is a 21 channel routine scalp EEG performed with bipolar and monopolar montages arranged in accordance to the international 10/20 system of electrode placement. One channel was dedicated to EKG recording. The patient is in the lethargic state. Description:  The background activity is slow and poorly organized.  This activity Iow voltage and consists of activity in the delta-theta continuum.   This activity is diffusely distributed and continuous. No epileptiform activity is noted. Hyperventilation was not performed.  Intermittent photic stimulation was performed but failed to illicit any change in  the tracing. IMPRESSION: This is an abnormal EEG secondary to general background slowing.  This finding may be seen with a diffuse disturbance that is etiologically nonspecific, but may include a metabolic encephalopathy, among other possibilities.  No epileptiform activity is noted.  [name] FarrLeslie Reynolds, MD Neurology [phone] 02/26/2020, 11:33 PM   [facility] Carotid [facility] ([facility] and [facility] only)  Result Date: 02/25/2020 CLINICAL DATA:  [name].  Hypertension, hyperlipidemia, diabetes EXAM: BILATERAL CAROTID DUPLEX ULTRASOUND TECHNIQUE: Wallace CullensGray scale imaging, color Doppler and duplex ultrasound were performed of bilateral carotid and vertebral arteries in the neck. Technologist describes technically difficult study secondary to unresponsive patient with coughing fits. COMPARISON:  07/24/2019 FINDINGS: Criteria: Quantification of carotid stenosis is based on velocity parameters that correlate the residual internal carotid diameter with NASCET-based stenosis levels, using the diameter of the distal internal carotid lumen as the denominator for stenosis measurement. The following velocity measurements were obtained: RIGHT ICA: 57/20 cm/sec CCA: 89/16 cm/sec SYSTOLIC ICA/CCA RATIO:  0.7 ECA: 320 cm/sec LEFT ICA: 87/27 cm/sec CCA: 159/26 cm/sec SYSTOLIC ICA/CCA RATIO:  0.5 ECA: 166 cm/sec RIGHT CAROTID ARTERY: Partially  calcified plaque in the bulb, extending into proximal ECA and ICA. Elevated peak systolic velocities just beyond the ECA origin. Normal waveforms and color Doppler signal in the ICA, without high-grade stenosis evident. RIGHT VERTEBRAL ARTERY:  Normal flow direction and waveform. LEFT CAROTID ARTERY: Eccentric partially calcified plaque in the bulb and ICA origin, without high-grade stenosis. Normal waveforms and color Doppler signal. Mild tortuosity. LEFT VERTEBRAL ARTERY:  Normal flow direction and waveform. IMPRESSION: 1. Bilateral carotid bifurcation plaque resulting in less than 50% diameter ICA stenosis. 2.  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 3. Antegrade bilateral vertebral arterial flow. Electronically Signed   By: Corlis Leak M.D.   On: 02/25/2020 09:54   DG Chest Port 1 View  Result Date: 02/25/2020 CLINICAL DATA:  Altered mental status.  Lethargic. EXAM: PORTABLE CHEST 1 VIEW COMPARISON:  Chest x-ray 02/24/2020 FINDINGS: The heart is enlarged but stable. Stable tortuosity of the thoracic aorta. Persistent left basilar density, atelectasis versus infiltrate. No definite pleural effusions. No worrisome pulmonary lesions. IMPRESSION: Persistent left basilar density, atelectasis versus infiltrate. Electronically Signed   By: Rudie Meyer M.D.   On: 02/25/2020 13:11   ECHOCARDIOGRAM COMPLETE  Result Date: 02/25/2020    ECHOCARDIOGRAM REPORT   Patient Name:   EMMELYN SCHMALE Date of Exam: 02/25/2020 Medical Rec #:  [id number]  Height:       62.0 in Accession #:    [id number] Weight:       280.0 lb Date of Birth:  22-Oct-1965  BSA:          2.206 m Patient Age:    53 years   BP:           174/92 mmHg Patient Gender: F          HR:           97 bpm. Exam Location:  [facility] Procedure: 2D Echo, Limited Color Doppler and Cardiac Doppler Indications:     G45.9 [name]  History:         Patient has prior history of Echocardiogram examinations, most                  recent 07/24/2019. Stroke; Risk Factors:Dyslipidemia, Diabetes                  and Hypertension.  Sonographer:     Humphrey Rolls RDCS (AE) Referring Phys:  Kenn File DOUTOVA Diagnosing Phys: Julien Nordmann MD  Sonographer Comments: Image acquisition challenging due to patient body habitus. IMPRESSIONS  1. Left ventricular ejection fraction, by estimation, is 60 to 65%. The left ventricle has normal function. The left ventricle has no regional wall motion abnormalities. Left ventricular diastolic parameters are consistent with Grade I diastolic dysfunction (impaired relaxation).  2. Right ventricular systolic  function is normal. The right ventricular size is normal.  3. The aortic valve is normal in structure. Aortic valve regurgitation is not visualized. Mild aortic valve sclerosis is present, with no evidence of aortic valve stenosis. FINDINGS  Left Ventricle: Left ventricular ejection fraction, by estimation, is 60 to 65%. The left ventricle has normal function. The left ventricle has no regional wall motion abnormalities. The left ventricular internal cavity size was normal in size. There is  no left ventricular hypertrophy. Left ventricular diastolic parameters are consistent with Grade I diastolic dysfunction (impaired relaxation). Right Ventricle: The right ventricular size is normal. No increase in right ventricular wall thickness. Right ventricular systolic function is normal. Left Atrium:  Left atrial size was normal in size. Right Atrium: Right atrial size was normal in size. Pericardium: There is no evidence of pericardial effusion. Mitral Valve: The mitral valve is normal in structure. Normal mobility of the mitral valve leaflets. No evidence of mitral valve regurgitation. No evidence of mitral valve stenosis. MV peak gradient, 11.8 mmHg. The mean mitral valve gradient is 4.0 mmHg. Tricuspid Valve: The tricuspid valve is normal in structure. Tricuspid valve regurgitation is mild . No evidence of tricuspid stenosis. Aortic Valve: The aortic valve is normal in structure. Aortic valve regurgitation is not visualized. Mild aortic valve sclerosis is present, with no evidence of aortic valve stenosis. Aortic valve mean gradient measures 6.0 mmHg. Aortic valve peak gradient measures 12.8 mmHg. Aortic valve area, by VTI measures 1.98 cm. Pulmonic Valve: The pulmonic valve was normal in structure. Pulmonic valve regurgitation is not visualized. No evidence of pulmonic stenosis. Aorta: The aortic root is normal in size and structure. Venous: The inferior vena cava is normal in size with greater than 50% respiratory  variability, suggesting right atrial pressure of 3 mmHg. IAS/Shunts: No atrial level shunt detected by color flow Doppler.  LEFT VENTRICLE PLAX 2D LVIDd:         4.16 cm  Diastology LVIDs:         2.84 cm  LV e' lateral:   9.36 cm/s LV PW:         0.94 cm  LV E/e' lateral: 10.9 LV IVS:        0.84 cm  LV e' medial:    12.80 cm/s LVOT diam:     1.90 cm  LV E/e' medial:  8.0 LV SV:         50 LV SV Index:   22 LVOT Area:     2.84 cm  RIGHT VENTRICLE RV Basal diam:  3.27 cm LEFT ATRIUM             Index LA diam:        3.25 cm 1.47 cm/m LA Vol (A2C):   28.8 ml 13.06 ml/m LA Vol (A4C):   35.7 ml 16.19 ml/m LA Biplane Vol: 35.0 ml 15.87 ml/m  AORTIC VALVE                    PULMONIC VALVE AV Area (Vmax):    1.71 cm     PV Vmax:       1.57 m/s AV Area (Vmean):   1.63 cm     PV Vmean:      102.000 cm/s AV Area (VTI):     1.98 cm     PV VTI:        0.226 m AV Vmax:           179.00 cm/s  PV Peak grad:  9.9 mmHg AV Vmean:          116.000 cm/s PV Mean grad:  5.0 mmHg AV VTI:            0.250 m AV Peak Grad:      12.8 mmHg AV Mean Grad:      6.0 mmHg LVOT Vmax:         108.00 cm/s LVOT Vmean:        66.700 cm/s LVOT VTI:          0.175 m LVOT/AV VTI ratio: 0.70  AORTA Ao Root diam: 2.80 cm MITRAL VALVE MV Area (PHT): 5.27 cm     SHUNTS MV Peak grad:  11.8 mmHg    Systemic VTI:  0.18 m MV Mean grad:  4.0 mmHg     Systemic Diam: 1.90 cm MV Vmax:       1.72 m/s MV Vmean:      87.5 cm/s MV Decel Time: 144 msec MV E velocity: 102.00 cm/s MV A velocity: 121.00 cm/s MV E/A ratio:  0.84 Julien Nordmann MD Electronically signed by Julien Nordmann MD Signature Date/Time: 02/25/2020/3:28:11 PM    Final     Scheduled Meds:   stroke: mapping our early stages of recovery book   Does not apply Once   aspirin  81 mg Oral Daily   atorvastatin  40 mg Oral q1800   azithromycin  250 mg Oral Daily   clopidogrel  75 mg Oral Daily   divalproex  500 mg Oral QHS   And   divalproex  250 mg Oral q AM   enoxaparin (LOVENOX)  injection  40 mg Subcutaneous Q12H   insulin aspart  0-9 Units Subcutaneous Q4H   insulin detemir  35 Units Subcutaneous Daily   sodium chloride flush  10-40 mL Intracatheter Q12H   Continuous Infusions:  sodium chloride 40 mL/hr at 02/26/20 1539   cefTRIAXone (ROCEPHIN)  IV 2 g (02/26/20 1631)    Assessment/Plan:  1. Abnormal Mews with rapid shallow breathing.  Will get ct chest r/o PE. 2. Acute metabolic encephalopathy. MRI of the brain does not show any signs of acute stroke.  Depakote level is normal. Could be infection related with pneumonia.  Gentle fluids.  We will get swallow evaluation.  Hold Neurontin for now.  Slow to improve 3. Retrocardiac pneumonia on Rocephin and Zithromax 4. Acute kidney injury and dehydration.  Gentle fluids.  Creatinine improved with fluids 5. Weakness. Unable to straight leg raise. Physical therapy evaluation recommended rehab. 6. Prior nonhemorrhagic stroke with encephalomalacia. On aspirin, Plavix and atorvastatin. 7. History of seizure disorder on Depakote. 8. Type 2 diabetes mellitus with hyperlipidemia on detemir insulin and sliding scale 9. Morbid obesity with a BMI of 44.74    Code Status:     Code Status Orders  (From admission, onward)         Start     Ordered   02/24/20 2241  Full code  Continuous        02/24/20 2240        Code Status History    Date Active Date Inactive Code Status Order ID Comments User Context   12/20/2019 2352 12/22/2019 2046 Full Code [id number]  Andris Baumann, MD ED   07/23/2019 1759 07/25/2019 1954 Full Code [id number]  Rolly Salter, MD ED   04/16/2019 1554 04/17/2019 1746 Full Code [id number]  Auburn Bilberry, MD Inpatient   08/14/2016 2201 08/16/2016 2245 Full Code [id number]  Michael Litter, MD Inpatient   05/11/2016 1729 05/15/2016 1514 Full Code [id number]  Katharina Caper, MD Inpatient   11/27/2015 1235 11/28/2015 1626 Full Code [id number]  Houston Siren, MD ED   06/14/2015 0620 06/15/2015 2214 Full Code  [id number]  Arnaldo Natal, MD Inpatient   05/19/2015 0111 05/19/2015 2041 Full Code [id number]  Crissie Figures, MD Inpatient   Advance Care Planning Activity     Family Communication: Husband on the phone Disposition Plan: Status is: Inpatient   Dispo: The patient is from: Home              Anticipated d/c is to: Rehab  Anticipated d/c date is: Likely will need a few days of working with physical therapy and watching mental status improve              Patient currently with altered mental status and unable to walk.  With change in mews score and rapid shallow breathing, will get ct chest to r/o PE.  Consultants:  Neurology  Antibiotics:  Rocephin and Zithromax  Time spent: 26 minutes  [employer]

## 2020-02-28 LAB — GLUCOSE, CAPILLARY
Glucose-Capillary: 163 mg/dL — ABNORMAL HIGH (ref 70–99)
Glucose-Capillary: 195 mg/dL — ABNORMAL HIGH (ref 70–99)
Glucose-Capillary: 201 mg/dL — ABNORMAL HIGH (ref 70–99)
Glucose-Capillary: 216 mg/dL — ABNORMAL HIGH (ref 70–99)
Glucose-Capillary: 222 mg/dL — ABNORMAL HIGH (ref 70–99)
Glucose-Capillary: 280 mg/dL — ABNORMAL HIGH (ref 70–99)

## 2020-02-28 LAB — BASIC METABOLIC PANEL
Anion gap: 10 (ref 5–15)
BUN: 17 mg/dL (ref 6–20)
CO2: 25 mmol/L (ref 22–32)
Calcium: 8.5 mg/dL — ABNORMAL LOW (ref 8.9–10.3)
Chloride: 109 mmol/L (ref 98–111)
Creatinine, Ser: 1.08 mg/dL — ABNORMAL HIGH (ref 0.44–1.00)
GFR calc Af Amer: >60 mL/min (ref >60)
GFR calc non Af Amer: 58 mL/min — ABNORMAL LOW (ref >60)
Glucose, Bld: 195 mg/dL — ABNORMAL HIGH (ref 70–99)
Potassium: 4.6 mmol/L (ref 3.5–5.1)
Sodium: 144 mmol/L (ref 135–145)

## 2020-02-28 LAB — MAGNESIUM: Magnesium: 2 mg/dL (ref 1.7–2.4)

## 2020-02-28 MED ORDER — PNEUMOCOCCAL VAC POLYVALENT 25 MCG/0.5ML IJ INJ
0.5000 mL | INJECTION | INTRAMUSCULAR | Status: AC
  Administered 2020-03-03: 0.5 mL via INTRAMUSCULAR
  Filled 2020-02-28: qty 0.5

## 2020-02-28 MED ORDER — SODIUM CHLORIDE 0.9 % IV SOLN
3.0000 g | Freq: Four times a day (QID) | INTRAVENOUS | Status: DC
  Administered 2020-02-28 – 2020-03-01 (×8): 3 g via INTRAVENOUS
  Filled 2020-02-28: qty 3
  Filled 2020-02-28: qty 8
  Filled 2020-02-28 (×6): qty 3
  Filled 2020-02-28: qty 8
  Filled 2020-02-28: qty 3
  Filled 2020-02-28: qty 8

## 2020-02-28 MED ORDER — MAGNESIUM SULFATE 2 GM/50ML IV SOLN
2.0000 g | Freq: Once | INTRAVENOUS | Status: AC
  Administered 2020-02-28: 14:00:00 2 g via INTRAVENOUS
  Filled 2020-02-28: qty 50

## 2020-02-28 NOTE — Progress Notes (Signed)
tele called pt has 11 beat run vtach. no cahnge in mental status > BP is 145/70 HR 94 looks like SR. notified MD wieting

## 2020-02-28 NOTE — Progress Notes (Signed)
Patient ID: [name], female   DOB: 1966/03/04, 54 y.o.   MRN: [id number] [facility] PROGRESS NOTE  [name] [id number] DOB: 28-Feb-1966 DOA: 02/24/2020 [facility]CP: [facility]  HPI/Subjective: Patient answers some yes or no questions but unable to elaborate.  Still weak.  Still with cough.  Objective: Vitals:   02/28/20 0415 02/28/20 0735  BP: (!) 147/60 (!) 145/81  Pulse: 94 89  Resp: 20 17  Temp: 97.7 F (36.5 C) 98.7 F (37.1 C)  SpO2: 95% 93%    Intake/Output Summary (Last 24 hours) at 02/28/2020 1154 Last data filed at 02/28/2020 0900 Gross per 24 hour  Intake 480 ml  Output --  Net 480 ml   Filed Weights   02/24/20 1420 02/26/20 0417 02/27/20 0358  Weight: 127 kg 127.5 kg 110.9 kg    ROS: Review of Systems  Constitutional: Positive for malaise/fatigue. Negative for fever.  Eyes: Negative for blurred vision.  Respiratory: Positive for cough. Negative for shortness of breath.   Cardiovascular: Negative for chest pain.  Gastrointestinal: Negative for abdominal pain, nausea and vomiting.  Genitourinary: Negative for dysuria.  Musculoskeletal: Positive for back pain.  Neurological: Negative for dizziness.   Exam: Physical Exam  HENT:  Nose: No mucosal edema.  Mouth/Throat: No oropharyngeal exudate.  Eyes: Pupils are equal, round, and reactive to light. Conjunctivae and lids are normal.  Cardiovascular: S1 normal and S2 normal. Exam reveals no gallop.  No murmur heard. Respiratory: No respiratory distress. She has decreased breath sounds in the right lower field and the left lower field. She has no wheezes. She has no rhonchi. She has no rales.  GI: Soft. Bowel sounds are normal. There is no abdominal tenderness.  Musculoskeletal:     Right lower leg: Edema present.     Left lower leg: Edema present.  Lymphadenopathy:    She has no cervical adenopathy.  Neurological: She is alert.  Able to straight leg raise with her left leg  today.  Because of her positioning unable to do the right side.  Skin: Skin is warm. No rash noted. Nails show no clubbing.  Psychiatric:  More alert today.      Data Reviewed: Basic Metabolic Panel: Recent Labs  Lab 02/24/20 1450 02/26/20 0808 02/27/20 0712 02/28/20 0505  NA 139 138 140 144  K 3.3* 3.4* 3.4* 4.6  CL 103 103 102 109  CO2 25 24 25 25   GLUCOSE 148* 182* 160* 195*  BUN 23* 13 14 17   CREATININE 1.16* 0.95 0.90 1.08*  CALCIUM 8.8* 8.4* 8.6* 8.5*  MG 1.6*  --  1.8 2.0   Liver Function Tests: Recent Labs  Lab 02/24/20 1450  AST 15  ALT 13  ALKPHOS 34*  BILITOT 1.1  PROT 7.1  ALBUMIN 3.8   CBC: Recent Labs  Lab 02/24/20 1537 02/26/20 0808  WBC 11.2* 13.4*  NEUTROABS 8.7*  --   HGB 10.6* 9.0*  HCT 34.1* 28.7*  MCV 65.0* 64.1*  PLT 198 165   BNP (last 3 results) Recent Labs    07/23/19 1128  BNP 34.0    CBG: Recent Labs  Lab 02/27/20 1609 02/27/20 2046 02/28/20 0023 02/28/20 0425 02/28/20 0755  GLUCAP 169* 180* 216* 195* 163*    Recent Results (from the past 240 hour(s))  SARS Coronavirus 2 by RT PCR (hospital order, performed in [facility] hospital lab) Nasopharyngeal Nasopharyngeal Swab     Status: None   Collection Time: 02/24/20  6:06 PM  Specimen: Nasopharyngeal Swab  Result Value Ref Range Status   SARS Coronavirus 2 NEGATIVE NEGATIVE Final    Comment: (NOTE) SARS-CoV-2 target nucleic acids are NOT DETECTED. The SARS-CoV-2 RNA is generally detectable in upper and lower respiratory specimens during the acute phase of infection. The lowest concentration of SARS-CoV-2 viral copies this assay can detect is 250 copies / mL. A negative result does not preclude SARS-CoV-2 infection and should not be used as the sole basis for treatment or other patient management decisions.  A negative result may occur with improper specimen collection / handling, submission of specimen other than nasopharyngeal swab, presence of viral  mutation(s) within the areas targeted by this assay, and inadequate number of viral copies (<250 copies / mL). A negative result must be combined with clinical observations, patient history, and epidemiological information. Fact Sheet for Patients:   StrictlyIdeas.no Fact Sheet for Healthcare Providers: BankingDealers.co.za This test is not yet approved or cleared  by the [place] FDA and has been authorized for detection and/or diagnosis of SARS-CoV-2 by FDA under an Emergency Use Authorization (EUA).  This EUA will remain in effect (meaning this test can be used) for the duration of the COVID-19 declaration under Section 564(b)(1) of the Act, 21 U.S.C. section 360bbb-3(b)(1), unless the authorization is terminated or revoked sooner. Performed at [facility], [address] [place], [place] [address]      Studies: EEG  Result Date: 02/26/2020 [name] Goodell, MD     02/26/2020 11:38 PM ELECTROENCEPHALOGRAM REPORT Patient: [name]       Room #: 426S-TM EEG No. ID: [id number] Age: 54 y.o.        Sex: female Requesting Physician: Leslye Peer Report Date:  02/26/2020       Interpreting Physician: [name] History: [name] is an 54 y.o. female with altered mental status Medications: Depakote, Insulin, ASA, Lipitor, Rocephin, Zithromax Conditions of Recording:  This is a 21 channel routine scalp EEG performed with bipolar and monopolar montages arranged in accordance to the international 10/20 system of electrode placement. One channel was dedicated to EKG recording. The patient is in the lethargic state. Description:  The background activity is slow and poorly organized.  This activity Iow voltage and consists of activity in the delta-theta continuum.   This activity is diffusely distributed and continuous. No epileptiform activity is noted. Hyperventilation was not performed.  Intermittent photic stimulation was performed but  failed to illicit any change in the tracing. IMPRESSION: This is an abnormal EEG secondary to general background slowing.  This finding may be seen with a diffuse disturbance that is etiologically nonspecific, but may include a metabolic encephalopathy, among other possibilities.  No epileptiform activity is noted.  [name] Goodell, MD Neurology [phone] 02/26/2020, 11:33 PM   CT ANGIO CHEST PE W OR WO CONTRAST  Result Date: 02/27/2020 CLINICAL DATA:  Increased shortness of breath. Evaluate for pulmonary embolism EXAM: CT ANGIOGRAPHY CHEST WITH CONTRAST TECHNIQUE: Multidetector CT imaging of the chest was performed using the standard protocol during bolus administration of intravenous contrast. Multiplanar CT image reconstructions and MIPs were obtained to evaluate the vascular anatomy. CONTRAST:  163mL OMNIPAQUE IOHEXOL 350 MG/ML SOLN COMPARISON:  Chest radiograph-02/25/2020 FINDINGS: Examination is degraded due to patient respiratory artifact Vascular Findings: There is adequate opacification of the pulmonary arterial system with the main pulmonary artery measuring 231 Hounsfield units. There are no discrete filling defects within the pulmonary arterial tree to the level of the bilateral segmental pulmonary arteries. Evaluation of the  segmental and subsegmental pulmonary arteries is degraded secondary to patient respiratory artifact. Normal caliber of the main pulmonary artery. Cardiomegaly, in particular, there is enlargement the left ventricle. Extensive coronary artery calcifications. No pericardial effusion though a small amount of fluid seen with the pericardial recess. No evidence of thoracic aortic aneurysm or dissection on this nongated examination. Conventional configuration of the aortic arch. Minimal amount of atherosclerotic plaque within normal caliber descending thoracic aorta. Review of the MIP images confirms the above findings.  ---------------------------------------------------------------------------------- Nonvascular Findings: Mediastinum/Lymph Nodes: Scattered mediastinal and left hilar lymph nodes are prominent though individually not enlarged by size criteria with index AP window lymph node measuring 0.9 cm in greatest short axis diameter (image 27, series 4) and index left infrahilar lymph node measuring 0.8 cm (image 36, series 4) presumably reactive in etiology. No bulky mediastinal, hilar or axillary lymph adenopathy on this motion degraded examination. Lungs/Pleura: Heterogeneous slightly nodular airspace opacities are seen within the left lower lobe with relative subpleural sparing. No additional discrete airspace opacities are identified. No pleural effusion or pneumothorax. The central pulmonary airways appear patent with special attention paid to left lower lobe bronchi. Evaluation for discrete pulmonary nodules is degraded secondary to patient respiratory artifact and parenchymal abnormalities. Upper abdomen: Limited arterial phase of evaluation of the upper abdomen is normal. Musculoskeletal: No acute or aggressive osseous abnormalities. Stigmata of dish within the thoracic spine. Regional soft tissues appear normal. Normal appearance of the thyroid gland. IMPRESSION: 1. No evidence of central pulmonary embolism. 2. Rather extensive left lower lobe heterogeneous nodular airspace opacities with relative subpleural sparing nonspecific though most suggestive of developing infection (including atypical etiologies) or aspiration. Follow-up chest radiograph in 3-4 weeks is recommended to ensure resolution. 3. Extensive coronary artery calcifications. Aortic Atherosclerosis (ICD10-I70.0). 4. Marked cardiomegaly. Electronically Signed   By: Simonne Come M.D.   On: 02/27/2020 09:43    Scheduled Meds: . aspirin  81 mg Oral Daily  . atorvastatin  40 mg Oral q1800  . azithromycin  250 mg Oral Daily  . clopidogrel  75 mg Oral  Daily  . divalproex  500 mg Oral QHS   And  . divalproex  250 mg Oral q AM  . enoxaparin (LOVENOX) injection  40 mg Subcutaneous Q12H  . insulin aspart  0-9 Units Subcutaneous Q4H  . insulin detemir  35 Units Subcutaneous Daily  . mouth rinse  15 mL Mouth Rinse BID  . metoprolol tartrate  12.5 mg Oral BID  . sodium chloride flush  10-40 mL Intracatheter Q12H   Continuous Infusions: . sodium chloride 40 mL/hr at 02/27/20 1426  . ampicillin-sulbactam (UNASYN) IV      Assessment/Plan:  1. Acute metabolic encephalopathy. MRI of the brain does not show any signs of acute stroke.  Depakote level is normal.  Likely related to pneumonia..  Gentle fluids.  We will get swallow evaluation.  Hold Neurontin for now.  Slow to improve 2. Left lobar pneumonia on CT scan.  This could be aspiration I will switch Rocephin over to Unasyn.  Finish up course with Zithromax. 3. Acute kidney injury and dehydration.  Gentle fluids.  Creatinine up a little bit today after CT scan.  Continue fluids. 4. Weakness.  Physical therapy recommending rehab. 5. Prior nonhemorrhagic stroke with encephalomalacia. On aspirin, Plavix and atorvastatin. 6. History of seizure disorder on Depakote. 7. Type 2 diabetes mellitus with hyperlipidemia on detemir insulin and sliding scale 8. Morbid obesity with a BMI of 44.74   9. Just  got called with nonsustained ventricular tachycardia.  I will give IV magnesium.  Code Status:     Code Status Orders  (From admission, onward)         Start     Ordered   02/24/20 2241  Full code  Continuous        02/24/20 2240        Code Status History    Date Active Date Inactive Code Status Order ID Comments User Context   12/20/2019 2352 12/22/2019 2046 Full Code [id number]  Andris Baumann, MD ED   07/23/2019 1759 07/25/2019 1954 Full Code [id number]  Rolly Salter, MD ED   04/16/2019 1554 04/17/2019 1746 Full Code [id number]  Auburn Bilberry, MD Inpatient   08/14/2016 2201 08/16/2016 2245  Full Code [id number]  Michael Litter, MD Inpatient   05/11/2016 1729 05/15/2016 1514 Full Code [id number]  Katharina Caper, MD Inpatient   11/27/2015 1235 11/28/2015 1626 Full Code [id number]  Houston Siren, MD ED   06/14/2015 0620 06/15/2015 2214 Full Code [id number]  Arnaldo Natal, MD Inpatient   05/19/2015 0111 05/19/2015 2041 Full Code [id number]  Crissie Figures, MD Inpatient   Advance Care Planning Activity     Family Communication: Husband on the phone Disposition Plan: Status is: Inpatient   Dispo: The patient is from: Home              Anticipated d/c is to: Rehab              Anticipated d/c date is: 03/01/2020.              Patient currently with altered mental status and unable to walk.  We will change Rocephin over to Unasyn to see if that helps better with the patient's pneumonia and mental status.  Consultants:  Neurology  Antibiotics:  Unasyn and Zithromax  Time spent: 27 minutes  [employer]

## 2020-02-28 NOTE — Consult Note (Signed)
Pharmacy Antibiotic Note  [name] is a 54 y.o. female admitted on 02/24/2020 with weakness / [name]. Chest imaging concerning for infectious process. Patient with acute metabolic encephalopathy and concern for aspiration. Pharmacy has been consulted for Unasyn dosing.  Plan: Unasyn 3 g IV q6h  Height: 5\' 2"  (157.5 cm) Weight: 110.9 kg (244 lb 9.6 oz) IBW/kg (Calculated) : 50.1  Temp (24hrs), Avg:98.3 F (36.8 C), Min:97.7 F (36.5 C), Max:98.7 F (37.1 C)  Recent Labs  Lab 02/24/20 1450 02/24/20 1537 02/26/20 0808 02/27/20 0712 02/28/20 0505  WBC  --  11.2* 13.4*  --   --   CREATININE 1.16*  --  0.95 0.90 1.08*    Estimated Creatinine Clearance: 70 mL/min (A) (by C-G formula based on SCr of 1.08 mg/dL (H)).    No Known Allergies  Antimicrobials this admission: Ceftriaxone 6/10 >> 6/13 Azithromycin 6/11 >> 6/15 (entered stop date) Unasyn 6/13 >>   Dose adjustments this admission: n/a  Microbiology results: 6/9 SARS-CoV-2 negative  Thank you for allowing pharmacy to be a part of this patient's care.  8/9  Pharmacy Resident 02/28/2020 11:50 AM

## 2020-02-28 NOTE — Progress Notes (Signed)
Patient ID: [name]  [facility] [place] at [facility]        [name] was admitted to the Hospital on 02/24/2020 still currently in the hospital as of 02/28/2020.  Her husband [name] has been with her during the hospital course.  Please excuse from work during this time period.   Alford Highland M.D on 02/28/2020,at 11:51 AM  [facility] [place] at [facility]

## 2020-02-29 LAB — GLUCOSE, CAPILLARY
Glucose-Capillary: 212 mg/dL — ABNORMAL HIGH (ref 70–99)
Glucose-Capillary: 171 mg/dL — ABNORMAL HIGH (ref 70–99)
Glucose-Capillary: 137 mg/dL — ABNORMAL HIGH (ref 70–99)
Glucose-Capillary: 239 mg/dL — ABNORMAL HIGH (ref 70–99)
Glucose-Capillary: 289 mg/dL — ABNORMAL HIGH (ref 70–99)
Glucose-Capillary: 291 mg/dL — ABNORMAL HIGH (ref 70–99)
Glucose-Capillary: 183 mg/dL — ABNORMAL HIGH (ref 70–99)

## 2020-02-29 LAB — CBC
HCT: 27 % — ABNORMAL LOW (ref 36.0–46.0)
Hemoglobin: 8.7 g/dL — ABNORMAL LOW (ref 12.0–15.0)
MCH: 20 pg — ABNORMAL LOW (ref 26.0–34.0)
MCHC: 32.2 g/dL (ref 30.0–36.0)
MCV: 62.1 fL — ABNORMAL LOW (ref 80.0–100.0)
Platelets: 200 10*3/uL (ref 150–400)
RBC: 4.35 MIL/uL (ref 3.87–5.11)
RDW: 20.4 % — ABNORMAL HIGH (ref 11.5–15.5)
WBC: 7.4 10*3/uL (ref 4.0–10.5)
nRBC: 0 % (ref 0.0–0.2)

## 2020-02-29 LAB — BASIC METABOLIC PANEL
Anion gap: 9 (ref 5–15)
BUN: 11 mg/dL (ref 6–20)
CO2: 27 mmol/L (ref 22–32)
Calcium: 8.7 mg/dL — ABNORMAL LOW (ref 8.9–10.3)
Chloride: 110 mmol/L (ref 98–111)
Creatinine, Ser: 0.85 mg/dL (ref 0.44–1.00)
GFR calc Af Amer: >60 mL/min (ref >60)
GFR calc non Af Amer: >60 mL/min (ref >60)
Glucose, Bld: 162 mg/dL — ABNORMAL HIGH (ref 70–99)
Potassium: 3.7 mmol/L (ref 3.5–5.1)
Sodium: 146 mmol/L — ABNORMAL HIGH (ref 135–145)

## 2020-02-29 LAB — MAGNESIUM: Magnesium: 2 mg/dL (ref 1.7–2.4)

## 2020-02-29 MED ORDER — DOCUSATE SODIUM 100 MG PO CAPS
100.0000 mg | ORAL_CAPSULE | Freq: Two times a day (BID) | ORAL | Status: DC
  Administered 2020-02-29 – 2020-03-03 (×7): 100 mg via ORAL
  Filled 2020-02-29 (×7): qty 1

## 2020-02-29 MED ORDER — FERROUS GLUCONATE 324 (38 FE) MG PO TABS
324.0000 mg | ORAL_TABLET | Freq: Two times a day (BID) | ORAL | Status: DC
  Administered 2020-03-01 – 2020-03-03 (×5): 324 mg via ORAL
  Filled 2020-02-29 (×7): qty 1

## 2020-02-29 NOTE — Progress Notes (Signed)
Patient ID: [name], female   DOB: Jun 05, 1966, 54 y.o.   MRN: [id number] [facility] PROGRESS NOTE  [name] QPY:[id number] DOB: 1966-08-04 DOA: 02/24/2020 [facility]CP: [facility]  HPI/Subjective: Patient seen this morning sitting in the chair.  She does answer some yes or no questions but does not elaborate much.  I asked her if her leg was comfortable hanging over the edge of the chair and she felt it felt okay.  Patient states that she sleeps a lot at home.  Husband states that she is not back to her usual self yet.  Objective: Vitals:   02/29/20 1004 02/29/20 1139  BP: (!) 141/71 (!) 143/87  Pulse: 95 88  Resp: 20 18  Temp: 98 F (36.7 C) 98 F (36.7 C)  SpO2: 97% 95%    Intake/Output Summary (Last 24 hours) at 02/29/2020 1325 Last data filed at 02/29/2020 1017 Gross per 24 hour  Intake 1791.67 ml  Output 125 ml  Net 1666.67 ml   Filed Weights   02/24/20 1420 02/26/20 0417 02/27/20 0358  Weight: 127 kg 127.5 kg 110.9 kg    ROS: Review of Systems  Constitutional: Positive for malaise/fatigue. Negative for fever.  Eyes: Negative for blurred vision.  Respiratory: Positive for cough. Negative for shortness of breath.   Cardiovascular: Negative for chest pain.  Gastrointestinal: Negative for abdominal pain, nausea and vomiting.  Genitourinary: Negative for dysuria.  Musculoskeletal: Positive for back pain.  Neurological: Negative for dizziness.   Exam: Physical Exam  HENT:  Nose: No mucosal edema.  Mouth/Throat: No oropharyngeal exudate.  Eyes: Pupils are equal, round, and reactive to light. Conjunctivae and lids are normal.  Cardiovascular: S1 normal and S2 normal. Exam reveals no gallop.  No murmur heard. Respiratory: No respiratory distress. She has decreased breath sounds in the right lower field and the left lower field. She has no wheezes. She has no rhonchi. She has no rales.  GI: Soft. Bowel sounds are normal. There is no abdominal  tenderness.  Musculoskeletal:     Right lower leg: Edema present.     Left lower leg: Edema present.  Lymphadenopathy:    She has no cervical adenopathy.  Neurological: She is alert.  Able to straight leg raise with her left leg today.  Because of her positioning unable to do the right side.  Skin: Skin is warm. No rash noted. Nails show no clubbing.  Psychiatric:  More alert today.      Data Reviewed: Basic Metabolic Panel: Recent Labs  Lab 02/24/20 1450 02/26/20 0808 02/27/20 0712 02/28/20 0505 02/29/20 0659  NA 139 138 140 144 146*  K 3.3* 3.4* 3.4* 4.6 3.7  CL 103 103 102 109 110  CO2 25 24 25 25 27   GLUCOSE 148* 182* 160* 195* 162*  BUN 23* 13 14 17 11   CREATININE 1.16* 0.95 0.90 1.08* 0.85  CALCIUM 8.8* 8.4* 8.6* 8.5* 8.7*  MG 1.6*  --  1.8 2.0 2.0   Liver Function Tests: Recent Labs  Lab 02/24/20 1450  AST 15  ALT 13  ALKPHOS 34*  BILITOT 1.1  PROT 7.1  ALBUMIN 3.8   CBC: Recent Labs  Lab 02/24/20 1537 02/26/20 0808 02/29/20 0659  WBC 11.2* 13.4* 7.4  NEUTROABS 8.7*  --   --   HGB 10.6* 9.0* 8.7*  HCT 34.1* 28.7* 27.0*  MCV 65.0* 64.1* 62.1*  PLT 198 165 200   BNP (last 3 results) Recent Labs    07/23/19 1128  BNP 34.0    CBG: Recent Labs  Lab 02/28/20 2016 02/29/20 0110 02/29/20 0438 02/29/20 0808 02/29/20 1201  GLUCAP 222* 212* 171* 137* 239*    Recent Results (from the past 240 hour(s))  SARS Coronavirus 2 by RT PCR (hospital order, performed in [facility] hospital lab) Nasopharyngeal Nasopharyngeal Swab     Status: None   Collection Time: 02/24/20  6:06 PM   Specimen: Nasopharyngeal Swab  Result Value Ref Range Status   SARS Coronavirus 2 NEGATIVE NEGATIVE Final    Comment: (NOTE) SARS-CoV-2 target nucleic acids are NOT DETECTED. The SARS-CoV-2 RNA is generally detectable in upper and lower respiratory specimens during the acute phase of infection. The lowest concentration of SARS-CoV-2 viral copies this assay can  detect is 250 copies / mL. A negative result does not preclude SARS-CoV-2 infection and should not be used as the sole basis for treatment or other patient management decisions.  A negative result may occur with improper specimen collection / handling, submission of specimen other than nasopharyngeal swab, presence of viral mutation(s) within the areas targeted by this assay, and inadequate number of viral copies (<250 copies / mL). A negative result must be combined with clinical observations, patient history, and epidemiological information. Fact Sheet for Patients:   BoilerBrush.com.cy Fact Sheet for Healthcare Providers: https://pope.com/ This test is not yet approved or cleared  by the [place] FDA and has been authorized for detection and/or diagnosis of SARS-CoV-2 by FDA under an Emergency Use Authorization (EUA).  This EUA will remain in effect (meaning this test can be used) for the duration of the COVID-19 declaration under Section 564(b)(1) of the Act, 21 U.S.C. section 360bbb-3(b)(1), unless the authorization is terminated or revoked sooner. Performed at [facility], [address] [place], [place] [address]      Scheduled Meds: . aspirin  81 mg Oral Daily  . atorvastatin  40 mg Oral q1800  . clopidogrel  75 mg Oral Daily  . divalproex  500 mg Oral QHS   And  . divalproex  250 mg Oral q AM  . docusate sodium  100 mg Oral BID  . enoxaparin (LOVENOX) injection  40 mg Subcutaneous Q12H  . insulin aspart  0-9 Units Subcutaneous Q4H  . insulin detemir  35 Units Subcutaneous Daily  . mouth rinse  15 mL Mouth Rinse BID  . metoprolol tartrate  12.5 mg Oral BID  . pneumococcal 23 valent vaccine  0.5 mL Intramuscular Tomorrow-1000   Continuous Infusions: . ampicillin-sulbactam (UNASYN) IV 3 g (02/29/20 1314)    Assessment/Plan:  1. Acute metabolic encephalopathy. MRI of the brain does not show any signs of  acute stroke.  Depakote level is normal.  Likely related to pneumonia..  Hold Neurontin for now.  Patient very slow to improve.  Patient on dysphagia 1 diet. 2. Left lobar pneumonia on CT scan.  Continue Unasyn.  Finished Zithromax. 3. Acute kidney injury and dehydration.  Creatinine improved to 0.85.  Discontinue IV fluids today 4. Weakness.  Physical therapy recommending rehab. 5. Prior nonhemorrhagic stroke with encephalomalacia. On aspirin, Plavix and atorvastatin. 6. History of seizure disorder on Depakote. 7. Type 2 diabetes mellitus with hyperlipidemia on detemir insulin and sliding scale 8. Morbid obesity with a BMI of 44.74   9. Nonsustained ventricular tachycardia given IV magnesium yesterday.  EF normal. 10. Iron deficiency anemia.  We will give oral iron.  Code Status:     Code Status Orders  (From admission, onward)  Start     Ordered   02/24/20 2241  Full code  Continuous        02/24/20 2240        Code Status History    Date Active Date Inactive Code Status Order ID Comments User Context   12/20/2019 2352 12/22/2019 2046 Full Code [id number]  Athena Masse, MD ED   07/23/2019 1759 07/25/2019 1954 Full Code [id number]  Lavina Hamman, MD ED   04/16/2019 1554 04/17/2019 1746 Full Code [id number]  Dustin Flock, MD Inpatient   08/14/2016 2201 08/16/2016 2245 Full Code [id number]  Lily Kocher, MD Inpatient   05/11/2016 1729 05/15/2016 1514 Full Code [id number]  Theodoro Grist, MD Inpatient   11/27/2015 1235 11/28/2015 1626 Full Code [id number]  Henreitta Leber, MD ED   06/14/2015 0620 06/15/2015 2214 Full Code [id number]  Harrie Foreman, MD Inpatient   05/19/2015 0111 05/19/2015 2041 Full Code [id number]  Juluis Mire, MD Inpatient   Advance Care Planning Activity     Family Communication: Husband on the phone Disposition Plan: Status is: Inpatient   Dispo: The patient is from: Home              Anticipated d/c is to: Rehab              Anticipated d/c date is:  03/01/2020.              Patient currently with altered mental status and unable to walk.  Patient's mental status is not back to her baseline yet.  I switched antibiotics yesterday from Rocephin over to Unasyn.  Will reevaluate tomorrow for potential discharge.  Consultants:  Neurology  Antibiotics:  Unasyn  Time spent: 26 minutes  [place]  [facility]           Patient ID: [name], female   DOB: [birth year], 54 y.o.   MRN: [id number]

## 2020-02-29 NOTE — TOC Progression Note (Signed)
Transition of Care [facility] - Progression Note    Patient Details  Name: [name] MRN: [id number] Date of Birth: Nov 20, 1965  [facility] CM/SW Contact  Trenton Founds, RN Phone Number: 02/29/2020, 11:48 AM  Clinical Narrative:  RNCM sent clinicals out to surrounding facilities in [place] and [place].     Expected Discharge Plan: Skilled Nursing Facility Barriers to Discharge: Continued Medical Work up  Expected Discharge Plan and Services Expected Discharge Plan: Skilled Nursing Facility In-house Referral: Clinical Social Work   Post Acute Care Choice: Skilled Nursing Facility Living arrangements for the past 2 months: Single Family Home                                       Social Determinants of Health (SDOH) Interventions    Readmission Risk Interventions Readmission Risk Prevention Plan 07/25/2019  Post Dischage Appt Complete  Medication Screening Complete  Transportation Screening Complete  Some recent data might be hidden

## 2020-02-29 NOTE — Progress Notes (Signed)
Physical Therapy Treatment Patient Details Name: [name] MRN: [id number] DOB: [birth year] Today's Date: 02/29/2020    History of Present Illness 54 y.o. female with medical history significant for recurrent CVAs with residual left hemiparesis and seizure disorder, diabetes, hypertension, urinary incontinence who was brought into the emergency room with a several day history of generalized weakness, causing falls, worsening in the past day to where patient will not get out of bed.  MRI significant for remote R MCA infarct, BUT NO ACUTE FINDINGS.    PT Comments    Patient received awake in bed, agreeable to PT session. Closing eyes and dropping head throughout session. Limited by lethargy. Requires multimodal cues for mobility, demonstrates limited initiation. She requires mod +2 assist for bed mobility, min +2 assist for sit to stand and min +2 assist for ambulation of 4 feet with RW. She will continue to benefit from skilled PT while here to improve strength and functional independence to return to baseline.      Follow Up Recommendations  SNF     Equipment Recommendations  None recommended by PT    Recommendations for Other Services       Precautions / Restrictions Precautions Precautions: Fall Restrictions Weight Bearing Restrictions: No    Mobility  Bed Mobility Overal bed mobility: Needs Assistance Bed Mobility: Supine to Sit     Supine to sit: Mod assist;+2 for physical assistance     General bed mobility comments: Pt shows minimal intiation of movements with one step commands, requires mod assist, +2 assist for supine <> sit.  Transfers Overall transfer level: Needs assistance Equipment used: Rolling walker (2 wheeled) Transfers: Sit to/from Stand Sit to Stand: Min assist;+2 physical assistance;+2 safety/equipment         General transfer comment: Patient performed sit to stand x 2 with +2 min assist  Ambulation/Gait Ambulation/Gait assistance: Min  assist;+2 physical assistance;+2 safety/equipment Gait Distance (Feet): 4 Feet Assistive device: Rolling walker (2 wheeled) Gait Pattern/deviations: Step-to pattern;Shuffle;Trunk flexed Gait velocity: decr   General Gait Details: Able to take a few side steps and then turned around to sit in recliner   Stairs             Wheelchair Mobility    Modified Rankin (Stroke Patients Only) Modified Rankin (Stroke Patients Only) Pre-Morbid Rankin Score: Moderately severe disability Modified Rankin: Moderately severe disability     Balance Overall balance assessment: Needs assistance Sitting-balance support: Feet supported Sitting balance-Leahy Scale: Fair Sitting balance - Comments: Once assisted to EOB (hips scooted forward and square to edge) she maintained balance w/o direct assist   Standing balance support: Bilateral upper extremity supported;During functional activity Standing balance-Leahy Scale: Fair Standing balance comment: Patient reliant on RW and external assist for safety with mobility                            Cognition Arousal/Alertness: Lethargic Behavior During Therapy: Flat affect Overall Cognitive Status: History of cognitive impairments - at baseline Area of Impairment: Attention;Following commands;Awareness;Problem solving                   Current Attention Level: Sustained   Following Commands: Follows one step commands inconsistently     Problem Solving: Decreased initiation;Slow processing;Requires verbal cues;Requires tactile cues General Comments: Patient requires multimodal cues for mobility. Decreased initiation, minimally verbal, lethargic      Exercises Total Joint Exercises Ankle Circles/Pumps: AAROM;Both;10 reps    General Comments  Pertinent Vitals/Pain Pain Assessment: No/denies pain    Home Living                      Prior Function            PT Goals (current goals can now be found  in the care plan section) Acute Rehab PT Goals Patient Stated Goal: none stated, husband hoping she can get back to near baseline with some time at rehab PT Goal Formulation: With family Time For Goal Achievement: 03/10/20 Potential to Achieve Goals: Fair Progress towards PT goals: Progressing toward goals    Frequency    Min 2X/week      PT Plan Current plan remains appropriate    Co-evaluation PT/OT/SLP Co-Evaluation/Treatment: Yes Reason for Co-Treatment: Necessary to address cognition/behavior during functional activity;For patient/therapist safety;To address functional/ADL transfers PT goals addressed during session: Mobility/safety with mobility;Balance;Proper use of DME        AM-PAC PT "6 Clicks" Mobility   Outcome Measure  Help needed turning from your back to your side while in a flat bed without using bedrails?: A Lot Help needed moving from lying on your back to sitting on the side of a flat bed without using bedrails?: A Lot Help needed moving to and from a bed to a chair (including a wheelchair)?: A Lot Help needed standing up from a chair using your arms (e.g., wheelchair or bedside chair)?: A Little Help needed to walk in hospital room?: A Lot Help needed climbing 3-5 steps with a railing? : A Lot 6 Click Score: 13    End of Session Equipment Utilized During Treatment: Gait belt Activity Tolerance: Patient limited by lethargy Patient left: in chair;with chair alarm set;with call bell/phone within reach Nurse Communication: Mobility status PT Visit Diagnosis: Unsteadiness on feet (R26.81);Muscle weakness (generalized) (M62.81);Difficulty in walking, not elsewhere classified (R26.2);Other abnormalities of gait and mobility (R26.89);Hemiplegia and hemiparesis Hemiplegia - Right/Left: Left Hemiplegia - caused by: Cerebral infarction     Time: 2094-7096 PT Time Calculation (min) (ACUTE ONLY): 18 min  Charges:  $Gait Training: 8-22 mins                      Yadiel Aubry, PT, GCS 02/29/20,11:19 AM

## 2020-02-29 NOTE — Progress Notes (Addendum)
  Speech Language Pathology Treatment: Dysphagia  Patient Details Name: [name] MRN: [id number] DOB: 1965-10-19 Today's Date: 02/29/2020 Time: 2423-5361 SLP Time Calculation (min) (ACUTE ONLY): 15 min  Assessment / Plan / Recommendation Clinical Impression  Pt was sitting up in chair sleeping as ST entered. Pt awakened easily but could not maintain alertness. Given max cues to stay awake, Pt took one bite of applesauce, 1 Tsp thin water, and one sip of nectar thick liquid without s/s of aspiration. No further boluses given as Pt needed cues to stay awake to swallow. Oral hold with thin and nectar thick liquids, needing cues to swallow. Rec continue with current pureed diet with honey thick liquids to be given when alert. Will continue to assess for diet upgrade once Pt can maintain alertness.    HPI HPI: [name] is a 54 y.o. female with past history of strokes (03/2014;  11/2015; 07/2019), history of hypertension, diabetes mellitus, hyperlipidemia, depression, epilepsy, hyperlipidemia. Pt with previous ST cognitive linguistic evaluation 08/14/2016 which identified moderate cognitive deficits in the areas of problem solving, task initiation, awareness and attention.       SLP Plan  Continue with current plan of care       Recommendations  Diet recommendations: Dysphagia 1 (puree);Honey-thick liquid Medication Administration: Whole meds with liquid Supervision: Trained caregiver to feed patient (Feed only when awake) Compensations: Slow rate;Small sips/bites;Minimize environmental distractions;Other (Comment) Postural Changes and/or Swallow Maneuvers: Out of bed for meals;Seated upright 90 degrees                Oral Care Recommendations: Oral care BID Follow up Recommendations: Skilled Nursing facility SLP Visit Diagnosis: Dysphagia, oropharyngeal phase (R13.12) Plan: Continue with current plan of care       GO                Eather Colas 02/29/2020, 12:06  PM

## 2020-03-01 DIAGNOSIS — J189 Pneumonia, unspecified organism: Secondary | ICD-10-CM

## 2020-03-01 DIAGNOSIS — D509 Iron deficiency anemia, unspecified: Secondary | ICD-10-CM

## 2020-03-01 LAB — GLUCOSE, CAPILLARY
Glucose-Capillary: 131 mg/dL — ABNORMAL HIGH (ref 70–99)
Glucose-Capillary: 138 mg/dL — ABNORMAL HIGH (ref 70–99)
Glucose-Capillary: 257 mg/dL — ABNORMAL HIGH (ref 70–99)
Glucose-Capillary: 174 mg/dL — ABNORMAL HIGH (ref 70–99)
Glucose-Capillary: 166 mg/dL — ABNORMAL HIGH (ref 70–99)
Glucose-Capillary: 180 mg/dL — ABNORMAL HIGH (ref 70–99)

## 2020-03-01 MED ORDER — AMOXICILLIN-POT CLAVULANATE 875-125 MG PO TABS
1.0000 | ORAL_TABLET | Freq: Two times a day (BID) | ORAL | Status: DC
  Administered 2020-03-01 – 2020-03-02 (×4): 1 via ORAL
  Filled 2020-03-01 (×4): qty 1

## 2020-03-01 MED ORDER — MODAFINIL 100 MG PO TABS
100.0000 mg | ORAL_TABLET | Freq: Every day | ORAL | Status: DC
  Administered 2020-03-01 – 2020-03-03 (×3): 100 mg via ORAL
  Filled 2020-03-01 (×3): qty 1

## 2020-03-01 MED ORDER — METOPROLOL TARTRATE 25 MG PO TABS
25.0000 mg | ORAL_TABLET | Freq: Two times a day (BID) | ORAL | Status: DC
  Administered 2020-03-01 – 2020-03-03 (×5): 25 mg via ORAL
  Filled 2020-03-01 (×5): qty 1

## 2020-03-01 MED ORDER — MELATONIN 5 MG PO TABS
5.0000 mg | ORAL_TABLET | Freq: Every day | ORAL | Status: DC
  Administered 2020-03-01 – 2020-03-02 (×2): 5 mg via ORAL
  Filled 2020-03-01 (×4): qty 1

## 2020-03-01 MED ORDER — MODAFINIL 100 MG PO TABS: 100.0000 mg | ORAL_TABLET | Freq: Every day | ORAL | Status: DC

## 2020-03-01 NOTE — Progress Notes (Signed)
Occupational Therapy Treatment Patient Details Name: [name] MRN: [id number] DOB: [birth year] Today's Date: 03/01/2020    History of present illness 54 y.o. female with medical history significant for recurrent CVAs with residual left hemiparesis and seizure disorder, diabetes, hypertension, urinary incontinence who was brought into the emergency room with a several day history of generalized weakness, causing falls, worsening in the past day to where patient will not get out of bed.  MRI significant for remote R MCA infarct, BUT NO ACUTE FINDINGS.   OT comments  [name] was seen for OT treatment on this date. Upon arrival to room pt asleep in bed c husband at bed side. Pt agreeable to tx. Pt closes eyes intermittently t/o session - however demonstrates improved command following this date. MOD A to exit bed L side c MAX A to return to supine - assist for BLE management. Pt tolerated sitting EOB ~15 mins c x3 STS trials. Pt requires MOD A for sit<>stand - currently reliant on BUE + RW and external assist for safety with standing - VCs to maintain upright position and WB through BUE. Pt tolerates ~14mins standing each trial c MAX encouragement. SETUP + close SBA face washing sitting EOB. MAX A adjust B socks sitting EOB. Pt making good progress toward goals. Pt continues to benefit from skilled OT services to maximize return to PLOF and minimize risk of future falls, injury, caregiver burden, and readmission. Will continue to follow POC. Discharge recommendation remains appropriate.    Follow Up Recommendations  SNF;Supervision/Assistance - 24 hour    Equipment Recommendations  Other (comment) (defer to post acute setting)    Recommendations for Other Services      Precautions / Restrictions Precautions Precautions: Fall Restrictions Weight Bearing Restrictions: No       Mobility Bed Mobility Overal bed mobility: Needs Assistance Bed Mobility: Supine to Sit;Sit to Supine     Supine to  sit: Mod assist Sit to supine: Max assist   General bed mobility comments: MAX A for BLE management to return to bed   Transfers Overall transfer level: Needs assistance Equipment used: Rolling walker (2 wheeled) Transfers: Sit to/from Stand Sit to Stand: From elevated surface;Mod assist         General transfer comment: sit<>stand x3 attempts - MOD A + RW increasing to MIN A on 2nd trial     Balance Overall balance assessment: Needs assistance Sitting-balance support: Feet supported;Single extremity supported Sitting balance-Leahy Scale: Fair Sitting balance - Comments: Once assisted to EOB she maintained balance w/o direct assist   Standing balance support: Bilateral upper extremity supported Standing balance-Leahy Scale: Fair Standing balance comment: Patient reliant on BUE on RW and external assist for safety with mobility - VCs to maintain upright position and WB through BUE                           ADL either performed or assessed with clinical judgement   ADL Overall ADL's : Needs assistance/impaired                                       General ADL Comments: SETUP face washing seated EOB c close SBA to maintain sitting balance. MIN A + RW + elevated surface for ADL transfers decreasing to MOD A c fatigue.      Vision       Perception  Praxis      Cognition Arousal/Alertness: Lethargic Behavior During Therapy: Flat affect Overall Cognitive Status: History of cognitive impairments - at baseline Area of Impairment: Attention;Following commands;Awareness;Problem solving                   Current Attention Level: Sustained   Following Commands: Follows one step commands with increased time     Problem Solving: Decreased initiation;Slow processing;Requires verbal cues;Requires tactile cues General Comments: Patient requires multimodal cues for mobility. Decreased initiation, minimally verbal, lethargic. Improved command  following this date         Exercises Exercises: Other exercises Other Exercises Other Exercises: Pt and family educated re: OT role, d/c recs, home/routines modifications, energy conservation, caregiver burnout, falls prevention Other Exercises: LBD, grooming task, sup<>sit, sit<>stand x3, 5 side steps at EOB, sitting/standing balance/tolerance.    Shoulder Instructions       General Comments      Pertinent Vitals/ Pain       Pain Assessment: No/denies pain  Home Living                                          Prior Functioning/Environment              Frequency  Min 2X/week        Progress Toward Goals  OT Goals(current goals can now be found in the care plan section)  Progress towards OT goals: Progressing toward goals  Acute Rehab OT Goals Patient Stated Goal: none stated, husband hoping she can get back to near baseline with some time at rehab OT Goal Formulation: With patient/family Time For Goal Achievement: 03/10/20 Potential to Achieve Goals: Good ADL Goals Pt Will Perform Lower Body Dressing: bed level;with caregiver independent in assisting;with mod assist;with adaptive equipment (with LRAD) Pt Will Transfer to Toilet: with mod assist;stand pivot transfer;bedside commode (with LRAD) Additional ADL Goal #1: Pt and pt's husband will verbalize x3 fall prevention strategies with min assist for improved safety and independence in ADLs.  Plan Discharge plan remains appropriate;Frequency remains appropriate    Co-evaluation                 AM-PAC OT "6 Clicks" Daily Activity     Outcome Measure   Help from another person eating meals?: A Little Help from another person taking care of personal grooming?: A Little Help from another person toileting, which includes using toliet, bedpan, or urinal?: A Lot Help from another person bathing (including washing, rinsing, drying)?: A Lot Help from another person to put on and taking off  regular upper body clothing?: A Little Help from another person to put on and taking off regular lower body clothing?: A Lot 6 Click Score: 15    End of Session Equipment Utilized During Treatment: Rolling walker  OT Visit Diagnosis: Other abnormalities of gait and mobility (R26.89);Muscle weakness (generalized) (M62.81)   Activity Tolerance Patient tolerated treatment well   Patient Left in bed;with call bell/phone within reach;with bed alarm set;with family/visitor present   Nurse Communication          Time: 2595-6387 OT Time Calculation (min): 31 min  Charges: OT General Charges $OT Visit: 1 Visit OT Treatments $Self Care/Home Management : 8-22 mins $Therapeutic Activity: 8-22 mins  Kathie Dike, M.S. OTR/L  03/01/20, 9:59 AM

## 2020-03-01 NOTE — TOC Progression Note (Signed)
Transition of Care [facility]) - Progression Note    Patient Details  Name: [name] MRN: [id number] Date of Birth: 03-21-1966  Transition of [facility] CM/SW Contact  Trenton Founds, RN Phone Number: 03/01/2020, 1:14 PM  Clinical Narrative:   RNCM placed call to patient's husband to discuss that very likely will not be able to find a facility that will accept her with Medicaid and no disability. Husband verbalizes understanding of this and that he might need to bring her home. Husband reports that they have services through [facility] and that they come out several times a week.  RNCM placed call to [facility] and spoke with nurse Coy Saunas, she reported that they are a personal care agency that provide CNA services. She reports that patient gets 18.5 hours a week through [employer].      Expected Discharge Plan: Skilled Nursing Facility Barriers to Discharge: Continued Medical Work up  Expected Discharge Plan and Services Expected Discharge Plan: Skilled Nursing Facility In-house Referral: Clinical Social Work   Post Acute Care Choice: Skilled Nursing Facility Living arrangements for the past 2 months: Single Family Home                                       Social Determinants of Health (SDOH) Interventions    Readmission Risk Interventions Readmission Risk Prevention Plan 07/25/2019  Post Dischage Appt Complete  Medication Screening Complete  Transportation Screening Complete  Some recent data might be hidden

## 2020-03-01 NOTE — Progress Notes (Signed)
Patient ID: [name], female   DOB: 10/15/1965, 54 y.o.   MRN: [id number] [facility] PROGRESS NOTE  [name] QJJ:[id number] DOB: 03/27/1966 DOA: 02/24/2020 [facility]CP: [facility]  HPI/Subjective: Patient seen while working with occupational therapy.  The patient was standing up and walking in place.  The patient did feel little dizzy with doing this.  Does feel tired.  Still has a little cough.  Objective: Vitals:   03/01/20 0804 03/01/20 1149  BP: (!) 153/86 (!) 148/66  Pulse: 91 89  Resp: 16 16  Temp: 99.3 F (37.4 C) 98.7 F (37.1 C)  SpO2: 96%     Intake/Output Summary (Last 24 hours) at 03/01/2020 1215 Last data filed at 03/01/2020 0945 Gross per 24 hour  Intake 120 ml  Output 350 ml  Net -230 ml   Filed Weights   02/24/20 1420 02/26/20 0417 02/27/20 0358  Weight: 127 kg 127.5 kg 110.9 kg    ROS: Review of Systems  Constitutional: Positive for malaise/fatigue. Negative for fever.  Eyes: Negative for blurred vision.  Respiratory: Positive for cough. Negative for shortness of breath.   Cardiovascular: Negative for chest pain.  Gastrointestinal: Negative for abdominal pain, nausea and vomiting.  Genitourinary: Negative for dysuria.  Musculoskeletal: Positive for back pain.  Neurological: Positive for dizziness.   Exam: Physical Exam  HENT:  Nose: No mucosal edema.  Mouth/Throat: No oropharyngeal exudate.  Eyes: Pupils are equal, round, and reactive to light. Conjunctivae and lids are normal.  Cardiovascular: S1 normal and S2 normal. Exam reveals no gallop.  No murmur heard. Respiratory: No respiratory distress. She has decreased breath sounds in the right lower field and the left lower field. She has no wheezes. She has no rhonchi. She has no rales.  GI: Soft. Bowel sounds are normal. There is no abdominal tenderness.  Musculoskeletal:     Right lower leg: Edema present.     Left lower leg: Edema present.  Lymphadenopathy:    She has  no cervical adenopathy.  Neurological: She is alert.  Was able to stand up today with Occupational Therapy.  Skin: Skin is warm. No rash noted. Nails show no clubbing.  Psychiatric:  Slow improvement in mental status.      Data Reviewed: Basic Metabolic Panel: Recent Labs  Lab 02/24/20 1450 02/26/20 0808 02/27/20 0712 02/28/20 0505 02/29/20 0659  NA 139 138 140 144 146*  K 3.3* 3.4* 3.4* 4.6 3.7  CL 103 103 102 109 110  CO2 25 24 25 25 27   GLUCOSE 148* 182* 160* 195* 162*  BUN 23* 13 14 17 11   CREATININE 1.16* 0.95 0.90 1.08* 0.85  CALCIUM 8.8* 8.4* 8.6* 8.5* 8.7*  MG 1.6*  --  1.8 2.0 2.0   Liver Function Tests: Recent Labs  Lab 02/24/20 1450  AST 15  ALT 13  ALKPHOS 34*  BILITOT 1.1  PROT 7.1  ALBUMIN 3.8   CBC: Recent Labs  Lab 02/24/20 1537 02/26/20 0808 02/29/20 0659  WBC 11.2* 13.4* 7.4  NEUTROABS 8.7*  --   --   HGB 10.6* 9.0* 8.7*  HCT 34.1* 28.7* 27.0*  MCV 65.0* 64.1* 62.1*  PLT 198 165 200   BNP (last 3 results) Recent Labs    07/23/19 1128  BNP 34.0    CBG: Recent Labs  Lab 02/29/20 1940 02/29/20 2323 03/01/20 0334 03/01/20 0803 03/01/20 1147  GLUCAP 291* 183* 131* 138* 257*    Recent Results (from the past 240 hour(s))  SARS Coronavirus  2 by RT PCR (hospital order, performed in [facility] hospital lab) Nasopharyngeal Nasopharyngeal Swab     Status: None   Collection Time: 02/24/20  6:06 PM   Specimen: Nasopharyngeal Swab  Result Value Ref Range Status   SARS Coronavirus 2 NEGATIVE NEGATIVE Final    Comment: (NOTE) SARS-CoV-2 target nucleic acids are NOT DETECTED. The SARS-CoV-2 RNA is generally detectable in upper and lower respiratory specimens during the acute phase of infection. The lowest concentration of SARS-CoV-2 viral copies this assay can detect is 250 copies / mL. A negative result does not preclude SARS-CoV-2 infection and should not be used as the sole basis for treatment or other patient management  decisions.  A negative result may occur with improper specimen collection / handling, submission of specimen other than nasopharyngeal swab, presence of viral mutation(s) within the areas targeted by this assay, and inadequate number of viral copies (<250 copies / mL). A negative result must be combined with clinical observations, patient history, and epidemiological information. Fact Sheet for Patients:   BoilerBrush.com.cy Fact Sheet for Healthcare Providers: https://pope.com/ This test is not yet approved or cleared  by the [place] FDA and has been authorized for detection and/or diagnosis of SARS-CoV-2 by FDA under an Emergency Use Authorization (EUA).  This EUA will remain in effect (meaning this test can be used) for the duration of the COVID-19 declaration under Section 564(b)(1) of the Act, 21 U.S.C. section 360bbb-3(b)(1), unless the authorization is terminated or revoked sooner. Performed at [facility], [address] [place], [place] [address]      Scheduled Meds: . amoxicillin-clavulanate  1 tablet Oral Q12H  . aspirin  81 mg Oral Daily  . atorvastatin  40 mg Oral q1800  . clopidogrel  75 mg Oral Daily  . divalproex  500 mg Oral QHS   And  . divalproex  250 mg Oral q AM  . docusate sodium  100 mg Oral BID  . enoxaparin (LOVENOX) injection  40 mg Subcutaneous Q12H  . ferrous gluconate  324 mg Oral BID WC  . insulin aspart  0-9 Units Subcutaneous Q4H  . insulin detemir  35 Units Subcutaneous Daily  . mouth rinse  15 mL Mouth Rinse BID  . metoprolol tartrate  25 mg Oral BID  . modafinil  100 mg Oral Daily  . pneumococcal 23 valent vaccine  0.5 mL Intramuscular Tomorrow-1000     Assessment/Plan:  1. Acute metabolic encephalopathy. MRI of the brain does not show any signs of acute stroke.  Depakote level is normal.  Likely related to pneumonia.  Hold Neurontin for now.  Patient very slow to improve.   Patient on dysphagia 2 diet with nectar thick liquid.. 2. Left lobar pneumonia on CT scan.  Switch Unasyn to Augmentin for another 2 days..  Finished Zithromax. 3. Acute kidney injury and dehydration.  Creatinine improved to 0.85. 4. Weakness.  Physical therapy recommending rehab. 5. Prior nonhemorrhagic stroke with encephalomalacia. On aspirin, Plavix and atorvastatin. 6. History of seizure disorder on Depakote. 7. Type 2 diabetes mellitus with hyperlipidemia on detemir insulin and sliding scale 8. Morbid obesity with a BMI of 44.74   9. Nonsustained ventricular tachycardia. EF normal.  Patient's electrolytes replaced. 10. Iron deficiency anemia.  Continue oral iron. 11. As per husband and patient the patient does sleep quite a bit.  Can fall asleep at any time.  We will give a trial of modafinil just in case this is an narcolepsy.  Do recommend an  outpatient sleep study.  We will get an overnight oximetry study to see if hypoxic overnight while sleeping. 12. Essential hypertension increased dose of metoprolol since blood pressure starting to creep up.  Code Status:     Code Status Orders  (From admission, onward)         Start     Ordered   02/24/20 2241  Full code  Continuous        02/24/20 2240        Code Status History    Date Active Date Inactive Code Status Order ID Comments User Context   12/20/2019 2352 12/22/2019 2046 Full Code [id number]  Athena Masse, MD ED   07/23/2019 1759 07/25/2019 1954 Full Code [id number]  Lavina Hamman, MD ED   04/16/2019 1554 04/17/2019 1746 Full Code [id number]  Dustin Flock, MD Inpatient   08/14/2016 2201 08/16/2016 2245 Full Code [id number]  Lily Kocher, MD Inpatient   05/11/2016 1729 05/15/2016 1514 Full Code [id number]  Theodoro Grist, MD Inpatient   11/27/2015 1235 11/28/2015 1626 Full Code [id number]  Henreitta Leber, MD ED   06/14/2015 0620 06/15/2015 2214 Full Code [id number]  Harrie Foreman, MD Inpatient   05/19/2015 0111 05/19/2015 2041 Full  Code [id number]  Juluis Mire, MD Inpatient   Advance Care Planning Activity     Family Communication: Husband at the bedside Disposition Plan: Status is: Inpatient   Dispo: The patient is from: Home             Anticipated d/c is to: Rehab             Anticipated d/c date is: 03/02/2020.             As per transitional care team, the facility that she was supposed to go to today did not accept.  Transitional team to look into other facility options.  Once the patient has an accepting facility she can be discharged to rehab.  Consultants:  Neurology  Antibiotics:  Augmentin  Time spent: 28 minutes  Waves

## 2020-03-01 NOTE — Progress Notes (Signed)
Speech Language Pathology Treatment: Dysphagia  Patient Details Name: [name] MRN: [id number] DOB: 29-Apr-1966 Today's Date: 03/01/2020 Time: 1050-1140 SLP Time Calculation (min) (ACUTE ONLY): 50 min  Assessment / Plan / Recommendation Clinical Impression  Pt seen for ongoing assessment of swallowing; husband present in room. Pt had been eating some of her dysphagia diet earlier per MD/husband w/ no witnessed difficulty swallowing; spillage upon attempting to self-feed. Pt awake w/ eyes opened. She spoke only a few words during session but followed instructions when given verbal cues. She appeared fatigued and often wanted to lower head downward -- husband gave verbal cues to "hold your head up"(this seemed effortful at times for her).  Pt consumed trials of Nectar liquids via cup, then straw, w/ No overt, clinical s/s of aspiration noted noted -- no overt pharyngeal phase deficits w/ all trial consistencies of Nectar liquids, purees, and minced/softened solids was observed. Vocal quality was clear when assessed; no decline in respiratory effort during/post trials. Oral phase c/b adequate bolus management of liquids and purees; min increased oral phase time for mastication and oral clearing w/ increased textured foods -- noted more of a munching patters w/ smacking during mastication of solids vs rotary chewing. Given time and alternating w/ moist food/puree, pt was able to clear adequately b/t trials of solids. Feeding Support given; verbal cues and model as pt was also noted to drink somewhat Impulsively w/ the Nectar liquids via straw -- taught husband the pinched straw technique to limit fast, multiple sips. Pt exhibits declined Cognitive functioning and requires assistance w/ feeding/oral intake tasks of ADLs to increased safety of tasks. Belching+ b/t trials. Recommend an upgrade to Dysphagia level 2 (MINCED foods w/ gravies) w/ Nectar consistency liquids; general aspiration and Reflux  precautions. Assistance at meals for support and monitoring safety; follow through w/ precautions. Recommend Pills given in Puree for safer swallowing d/t Cognitive decline. Pt should continue ongoing ST services at next venue of care for education and toleration of diet; trials to upgrade diet further if appropriate. Education re: precautions for family/caregivers as well. Husband/NSG/MD updated.     HPI HPI: Pt is a 54 y.o. female with past history of Multiple medical issues including Morbid Obesity, multiple Strokes (03/2014;  11/2015; 07/2019), history of hypertension, diabetes mellitus, hyperlipidemia, depression, epilepsy, hyperlipidemia. Pt with previous ST cognitive linguistic evaluation 08/14/2016 which identified moderate cognitive deficits in the areas of problem solving, task initiation, awareness and attention.  Pt's husband stated she requires consistent assistance w/ all ADLs at home.  CT of Chest this admission revealed "extensive left lower lobe heterogeneous nodular airspace opacities".  Husband endorsed pt getting "choked" at home when she "ate too much". He also endorsed that she "eats while sleeping in bed".       SLP Plan  Continue with current plan of care (education)       Recommendations  Diet recommendations: Dysphagia 2 (fine chop);Nectar-thick liquid Liquids provided via: Cup;Straw (monitor) Medication Administration: Whole meds with puree (for safer swallowing d/t Cognitive decline) Supervision: Staff to assist with self feeding;Full supervision/cueing for compensatory strategies Compensations: Minimize environmental distractions;Slow rate;Small sips/bites;Lingual sweep for clearance of pocketing;Follow solids with liquid Postural Changes and/or Swallow Maneuvers: Seated upright 90 degrees;Upright 30-60 min after meal (Reflux precautions)                General recommendations:  (Dietician f/u) Oral Care Recommendations: Oral care BID;Oral care before and after  PO;Staff/trained caregiver to provide oral care Follow up Recommendations: Skilled Nursing  facility (TBD) SLP Visit Diagnosis: Dysphagia, oropharyngeal phase (R13.12) Plan: Continue with current plan of care (education)       [place], [name], CCC-SLP Jessey Huyett 03/01/2020, 12:38 PM

## 2020-03-01 NOTE — TOC Progression Note (Signed)
Transition of Care [facility]) - Progression Note    Patient Details  Name: Jeriah Skufca MRN: [id number] Date of Birth: 02/10/1966  Transition of Care [facility]) CM/SW Contact  Trenton Founds, RN Phone Number: 03/01/2020, 12:52 PM  Clinical Narrative:   RNCM notified by attending MD that patient is close to being ready for discharge. Placed call to patient's husband to discuss that only facility that has offered bed is [facility] in [place], husband reports that he is agreeable to patient going to this facility. Placed call to facility and spoke with Chantell, she reported she would have to get in touch with her AR and then get back to this CM. She returned call and stated that since patient does not have disability (which was verified by husband) she will not be able to offer bed.  RNCM notified attending and patient's husband.     Expected Discharge Plan: Skilled Nursing Facility Barriers to Discharge: Continued Medical Work up  Expected Discharge Plan and Services Expected Discharge Plan: Skilled Nursing Facility In-house Referral: Clinical Social Work   Post Acute Care Choice: Skilled Nursing Facility Living arrangements for the past 2 months: Single Family Home                                       Social Determinants of Health (SDOH) Interventions    Readmission Risk Interventions Readmission Risk Prevention Plan 07/25/2019  Post Dischage Appt Complete  Medication Screening Complete  Transportation Screening Complete  Some recent data might be hidden

## 2020-03-02 LAB — GLUCOSE, CAPILLARY
Glucose-Capillary: 146 mg/dL — ABNORMAL HIGH (ref 70–99)
Glucose-Capillary: 140 mg/dL — ABNORMAL HIGH (ref 70–99)
Glucose-Capillary: 213 mg/dL — ABNORMAL HIGH (ref 70–99)
Glucose-Capillary: 185 mg/dL — ABNORMAL HIGH (ref 70–99)
Glucose-Capillary: 242 mg/dL — ABNORMAL HIGH (ref 70–99)

## 2020-03-02 NOTE — Progress Notes (Addendum)
Speech Language Pathology Treatment: Dysphagia  Patient Details Name: [name] MRN: [id number] DOB: [birth year] Today's Date: 03/02/2020 Time: 7846-9629 SLP Time Calculation (min) (ACUTE ONLY): 40 min  Assessment / Plan / Recommendation Clinical Impression  Pt seen for ongoing assessment of swallowing; husband present in room helping pt w/ breakfast. Pt eating some of her breakfast of Minced foods mixed in Puree -- husband stated he did this at home as well. Pt has tolerated her upgraded dysphagia diet per NSG w/ no witnessed difficulty swallowing; min spillage upon attempting to self-feed. Pt awake w/ eyes opened. She spoke only a few words during session but followed instructions when given verbal cues. She was slightly slouched down in bed and appeared min fatigued. She continues to exhibit a drowsy presentation overall waking up slightly, hardly answers any questions, per MD notes. She seemed more engaged when self-feeding -- Husband stated she eats "often" at home. A Dietician consult would be beneficial.    Pt consumed trials of Nectar liquids via cup w/ No overt, clinical s/s of aspiration noted noted -- no overt pharyngeal phase deficits w/ all trial consistencies of Nectar liquids, minced solids mixed in puree was observed. Vocal quality was clear when assessed; no decline in respiratory effort during/post trials. Oral phase c/b adequate bolus management of liquids and purees; min increased oral phase time for mastication and oral clearing w/ increased textured foods -- noted more of a smacking, lateral munching pattern during mastication of solids vs rotary chewing. Given time and alternating w/ moist food/puree or Nectar liquid, pt was able to clear adequately b/t trials of solids. Setup Support given; verbal cues and model as pt was also noted to drink somewhat Impulsively w/ the Nectar liquids intermittently. NSG and SLP demonstrated to Husband/pt the strategy of Crushing Pills and mixing in  2-3 tsps of Puree for Safer Swallowing overall. Pt exhibits declined Cognitive functioning and requires assistance w/ feeding/oral intake tasks of ADLs to increased safety of tasks.  Recommend continue a Dysphagia level 2 (MINCED foods w/ gravies) w/ Nectar consistency liquids; general aspiration and REFLUX precautions. Assistance at meals for support and monitoring safety; follow through w/ precautions. Recommend Pills given Crushed in Puree for safer swallowing d/t Cognitive decline. Pt should continue ongoing ST services at next venue of care for education and toleration of diet; trials to upgrade diet further if appropriate. Education re: diet consistency of foods/liquids(Dys. Level 2); food options and preparation and strategies; naturally thicker liquids in diet including Cream Soups; aspiration and REFLUX precautions; dysphagia; supportive strategies at meals including positioning upright in a chair is best. Handouts on diet consistencies and precautions and thickening supplies/ordering info. given. Husband, NSG agreed.    HPI HPI: Pt is a 54 y.o. female with past history of Multiple medical issues including Morbid Obesity, multiple Strokes (03/2014;  11/2015; 07/2019), history of hypertension, diabetes mellitus, hyperlipidemia, depression, epilepsy, hyperlipidemia. Pt with previous ST cognitive linguistic evaluation 08/14/2016 which identified moderate cognitive deficits in the areas of problem solving, task initiation, awareness and attention.  Pt's husband stated she requires consistent assistance w/ all ADLs at home.  CT of Chest this admission revealed "extensive left lower lobe heterogeneous nodular airspace opacities".  Husband endorsed pt getting "choked" at home when she "ate too much". He also endorsed that she "eats while sleeping in bed".       SLP Plan  Continue with current plan of care       Recommendations  Diet recommendations: Dysphagia 2 (fine chop);Nectar-thick  liquid Liquids  provided via: Cup;Straw (monitor) Medication Administration: Whole meds with puree (for safer swallowing) Supervision: Staff to assist with self feeding;Full supervision/cueing for compensatory strategies Compensations: Minimize environmental distractions;Slow rate;Small sips/bites;Lingual sweep for clearance of pocketing;Follow solids with liquid Postural Changes and/or Swallow Maneuvers: Seated upright 90 degrees;Upright 30-60 min after meal (Reflux precautions)                General recommendations:  (Dietician f/u) Oral Care Recommendations: Oral care BID;Oral care before and after PO;Staff/trained caregiver to provide oral care Follow up Recommendations: Skilled Nursing facility (TBD) SLP Visit Diagnosis: Dysphagia, oropharyngeal phase (R13.12) Plan: Continue with current plan of care       GO                [name] Som, MS, CCC-SLP [name] 03/02/2020, 11:00 AM

## 2020-03-02 NOTE — Progress Notes (Signed)
Patient ID: [name], female   DOB: March 18, 1966, 54 y.o.   MRN: [id number]  PROGRESS NOTE    [name]  POE:[id number] DOB: [birth year] DOA: 02/24/2020 [facility]CP: [facility]   Brief Narrative:  54 year old female with history of depression, diabetes, epilepsy, hypertension, hyperlipidemia, history of prior nonhemorrhagic stroke presented with fatigue and weakness with reported facial droop and slurred speech with left upper extremity weakness.  Neurology was consulted.  During the hospitalization, MRI of the brain did not show signs of acute stroke.  She was also found to have left lobar pneumonia on CT scan for which she was treated with IV antibiotics and switched to oral antibiotics.  PT recommended SNF placement.  Assessment & Plan:   Acute metabolic encephalopathy -Probably from pneumonia.  MRI of the brain did not show any acute stroke.  EEG was negative for any seizure activity.  Depakote level was normal.  Neurontin on hold -Still very sleepy and very slow to respond. -Diet as per SLP recommendations: On dysphagia 2 diet with nectar thick liquid -PT recommending SNF placement: As per case management, patient does not qualify for SNF placement.  Will need home health PT/OT on discharge. -Probable discharge tomorrow if remains stable and tolerates physical therapy today -Fall precautions.  Continue mental status monitoring  Left lobar pneumonia -Seen on CT scan.  Initially treated with Unasyn which has been switched to oral Augmentin.  Today is day #7 of antibiotics.  DC antibiotics afterwards.  Also received 4 days of Zithromax as well. -Currently respiratory status is stable  Acute kidney injury Dehydration -Resolved.  No labs today.  Prior nonhemorrhagic stroke with encephalomalacia -Continue aspirin, Plavix and statin  History of seizure disorder -Continue Depakote.  Morbid obesity -Outpatient follow-up  Nonsustained ventricular tachycardia -EF  normal.  Currently heart rate controlled.  Monitor electrolytes intermittently  Iron deficiency anemia -Continue oral iron supplementation  Probable obstructive sleep apnea -Patient is morbidly obese and as per the husband, patient sleeps quite a bit at home.  Probably will need outpatient sleep study.  DC modafinil which was started during the hospitalization  Essential hypertension -Monitor blood pressure.  Continue metoprolol.     DVT prophylaxis: Lovenox Code Status: Full Family Communication: Husband on phone on 03/02/2020.  He is requesting that patient be discharged tomorrow home once he has made arrangements Disposition Plan: Status is: Inpatient  Remains inpatient appropriate because:Altered mental status   Dispo: The patient is from: Home              Anticipated d/c is to: Home              Anticipated d/c date is: 1 day              Patient currently is not medically stable to d/c.   Consultants: Neurology  Procedures:  EEG on 02/26/2020 This is an abnormal EEG secondary to general background slowing.  This finding may be seen with a diffuse disturbance that is etiologically nonspecific, but may include a metabolic encephalopathy, among other possibilities.  No epileptiform activity is noted  Echo 1. Left ventricular ejection fraction, by estimation, is 60 to 65%. The  left ventricle has normal function. The left ventricle has no regional  wall motion abnormalities. Left ventricular diastolic parameters are  consistent with Grade I diastolic  dysfunction (impaired relaxation).  2. Right ventricular systolic function is normal. The right ventricular  size is normal.  3. The aortic valve is normal in structure.  Aortic valve regurgitation is  not visualized. Mild aortic valve sclerosis is present, with no evidence  of aortic valve stenosis.   Bilateral carotid ultrasound 1. Bilateral carotid bifurcation plaque resulting in less than 50% diameter ICA  stenosis. 2. 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 3. Antegrade bilateral vertebral arterial flow.  Antimicrobials:  Anti-infectives (From admission, onward)   Start     Dose/Rate Route Frequency Ordered Stop   03/01/20 1000  amoxicillin-clavulanate (AUGMENTIN) 875-125 MG per tablet 1 tablet     Discontinue     1 tablet Oral Every 12 hours 03/01/20 0932     02/28/20 1200  Ampicillin-Sulbactam (UNASYN) 3 g in sodium chloride 0.9 % 100 mL IVPB  Status:  Discontinued        3 g 200 mL/hr over 30 Minutes Intravenous Every 6 hours 02/28/20 1149 03/01/20 0932   02/26/20 1000  azithromycin (ZITHROMAX) tablet 250 mg       "Followed by" Linked Group Details   250 mg Oral Daily 02/25/20 1254 02/29/20 0852   02/25/20 1300  cefTRIAXone (ROCEPHIN) 2 g in sodium chloride 0.9 % 100 mL IVPB  Status:  Discontinued        2 g 200 mL/hr over 30 Minutes Intravenous Every 24 hours 02/25/20 1254 02/28/20 1143   02/25/20 1300  azithromycin (ZITHROMAX) tablet 500 mg       "Followed by" Linked Group Details   500 mg Oral Daily 02/25/20 1254 02/26/20 0959       Subjective: Patient seen and examined at bedside.  She is sleepy, wakes up only very slightly, hardly answers any questions.  No overnight fever, vomiting or seizures reported by nursing staff.  Objective: Vitals:   03/01/20 1946 03/01/20 2330 03/02/20 0324 03/02/20 0745  BP: (!) 164/75 (!) 156/92 (!) 145/82 (!) 149/89  Pulse: 84 79 80 81  Resp: 20 18 20 16   Temp: 98.2 F (36.8 C) 99.1 F (37.3 C) 98.2 F (36.8 C) 99.1 F (37.3 C)  TempSrc: Oral Oral Oral Axillary  SpO2: 98% 98% 98% 97%  Weight:      Height:        Intake/Output Summary (Last 24 hours) at 03/02/2020 0956 Last data filed at 03/01/2020 2211 Gross per 24 hour  Intake 120 ml  Output 1600 ml  Net -1480 ml   Filed Weights   02/24/20 1420 02/26/20 0417 02/27/20 0358  Weight: 127 kg 127.5 kg 110.9 kg     Examination:  General exam: Appears calm and comfortable.  Very sleepy, wakes up only very slightly, hardly answers any questions.  Poor historian  respiratory system: Bilateral decreased breath sounds at bases with some scattered crackles Cardiovascular system: S1 & S2 heard, Rate controlled Gastrointestinal system: Abdomen is morbidly obese, nondistended, soft and nontender. Normal bowel sounds heard. Extremities: No cyanosis, clubbing; trace lower extremity edema Central nervous system: Very drowsy.  No focal neurological deficits. Moving extremities Skin: No rashes, lesions or ulcers Psychiatry: Could not be assessed because of mental status    Data Reviewed: I have personally reviewed following labs and imaging studies  CBC: Recent Labs  Lab 02/24/20 1537 02/26/20 0808 02/29/20 0659  WBC 11.2* 13.4* 7.4  NEUTROABS 8.7*  --   --   HGB 10.6* 9.0* 8.7*  HCT 34.1* 28.7* 27.0*  MCV 65.0* 64.1* 62.1*  PLT 198 165 200   Basic Metabolic Panel: Recent Labs  Lab 02/24/20 1450 02/26/20 0808 02/27/20 0712 02/28/20 0505 02/29/20  0659  NA 139 138 140 144 146*  K 3.3* 3.4* 3.4* 4.6 3.7  CL 103 103 102 109 110  CO2 25 24 25 25 27   GLUCOSE 148* 182* 160* 195* 162*  BUN 23* 13 14 17 11   CREATININE 1.16* 0.95 0.90 1.08* 0.85  CALCIUM 8.8* 8.4* 8.6* 8.5* 8.7*  MG 1.6*  --  1.8 2.0 2.0   GFR: Estimated Creatinine Clearance: 89 mL/min (by C-G formula based on SCr of 0.85 mg/dL). Liver Function Tests: Recent Labs  Lab 02/24/20 1450  AST 15  ALT 13  ALKPHOS 34*  BILITOT 1.1  PROT 7.1  ALBUMIN 3.8   No results for input(s): LIPASE, AMYLASE in the last 168 hours. No results for input(s): AMMONIA in the last 168 hours. Coagulation Profile: No results for input(s): INR, PROTIME in the last 168 hours. Cardiac Enzymes: No results for input(s): CKTOTAL, CKMB, CKMBINDEX, TROPONINI in the last 168 hours. BNP (last 3 results) No results for input(s): PROBNP in the last 8760  hours. HbA1C: No results for input(s): HGBA1C in the last 72 hours. CBG: Recent Labs  Lab 03/01/20 1707 03/01/20 1934 03/01/20 2326 03/02/20 0321 03/02/20 0747  GLUCAP 174* 166* 180* 146* 140*   Lipid Profile: No results for input(s): CHOL, HDL, LDLCALC, TRIG, CHOLHDL, LDLDIRECT in the last 72 hours. Thyroid Function Tests: No results for input(s): TSH, T4TOTAL, FREET4, T3FREE, THYROIDAB in the last 72 hours. Anemia Panel: No results for input(s): VITAMINB12, FOLATE, FERRITIN, TIBC, IRON, RETICCTPCT in the last 72 hours. Sepsis Labs: No results for input(s): PROCALCITON, LATICACIDVEN in the last 168 hours.  Recent Results (from the past 240 hour(s))  SARS Coronavirus 2 by RT PCR (hospital order, performed in [facility] hospital lab) Nasopharyngeal Nasopharyngeal Swab     Status: None   Collection Time: 02/24/20  6:06 PM   Specimen: Nasopharyngeal Swab  Result Value Ref Range Status   SARS Coronavirus 2 NEGATIVE NEGATIVE Final    Comment: (NOTE) SARS-CoV-2 target nucleic acids are NOT DETECTED. The SARS-CoV-2 RNA is generally detectable in upper and lower respiratory specimens during the acute phase of infection. The lowest concentration of SARS-CoV-2 viral copies this assay can detect is 250 copies / mL. A negative result does not preclude SARS-CoV-2 infection and should not be used as the sole basis for treatment or other patient management decisions.  A negative result may occur with improper specimen collection / handling, submission of specimen other than nasopharyngeal swab, presence of viral mutation(s) within the areas targeted by this assay, and inadequate number of viral copies (<250 copies / mL). A negative result must be combined with clinical observations, patient history, and epidemiological information. Fact Sheet for Patients:   [facility] Fact Sheet for Healthcare Providers: 04/25/20 This  test is not yet approved or cleared  by the [employer] FDA and has been authorized for detection and/or diagnosis of SARS-CoV-2 by FDA under an Emergency Use Authorization (EUA).  This EUA will remain in effect (meaning this test can be used) for the duration of the COVID-19 declaration under Section 564(b)(1) of the Act, 21 U.S.C. section 360bbb-3(b)(1), unless the authorization is terminated or revoked sooner. Performed at [facility], [address] [place], [address] [place]          Radiology Studies: No results found.      Scheduled Meds: . amoxicillin-clavulanate  1 tablet Oral Q12H  . aspirin  81 mg Oral Daily  . atorvastatin  40 mg Oral q1800  .  clopidogrel  75 mg Oral Daily  . divalproex  500 mg Oral QHS   And  . divalproex  250 mg Oral q AM  . docusate sodium  100 mg Oral BID  . enoxaparin (LOVENOX) injection  40 mg Subcutaneous Q12H  . ferrous gluconate  324 mg Oral BID WC  . insulin aspart  0-9 Units Subcutaneous Q4H  . insulin detemir  35 Units Subcutaneous Daily  . mouth rinse  15 mL Mouth Rinse BID  . melatonin  5 mg Oral QHS  . metoprolol tartrate  25 mg Oral BID  . modafinil  100 mg Oral Daily  . pneumococcal 23 valent vaccine  0.5 mL Intramuscular Tomorrow-1000   Continuous Infusions:        Aline August, MD [facility] 03/02/2020, 9:56 AM

## 2020-03-03 LAB — COMPREHENSIVE METABOLIC PANEL
ALT: 27 U/L (ref 0–44)
AST: 17 U/L (ref 15–41)
Albumin: 3.1 g/dL — ABNORMAL LOW (ref 3.5–5.0)
Alkaline Phosphatase: 43 U/L (ref 38–126)
Anion gap: 10 (ref 5–15)
BUN: 15 mg/dL (ref 6–20)
CO2: 27 mmol/L (ref 22–32)
Calcium: 9.1 mg/dL (ref 8.9–10.3)
Chloride: 106 mmol/L (ref 98–111)
Creatinine, Ser: 0.84 mg/dL (ref 0.44–1.00)
GFR calc Af Amer: >60 mL/min (ref >60)
GFR calc non Af Amer: >60 mL/min (ref >60)
Glucose, Bld: 131 mg/dL — ABNORMAL HIGH (ref 70–99)
Potassium: 4 mmol/L (ref 3.5–5.1)
Sodium: 143 mmol/L (ref 135–145)
Total Bilirubin: 0.5 mg/dL (ref 0.3–1.2)
Total Protein: 6.9 g/dL (ref 6.5–8.1)

## 2020-03-03 LAB — CBC WITH DIFFERENTIAL/PLATELET
Abs Immature Granulocytes: 0.06 10*3/uL (ref 0.00–0.07)
Basophils Absolute: 0.1 10*3/uL (ref 0.0–0.1)
Basophils Relative: 1 %
Eosinophils Absolute: 0.2 10*3/uL (ref 0.0–0.5)
Eosinophils Relative: 3 %
HCT: 29.1 % — ABNORMAL LOW (ref 36.0–46.0)
Hemoglobin: 9.1 g/dL — ABNORMAL LOW (ref 12.0–15.0)
Immature Granulocytes: 1 %
Lymphocytes Relative: 29 %
Lymphs Abs: 2.2 10*3/uL (ref 0.7–4.0)
MCH: 19.6 pg — ABNORMAL LOW (ref 26.0–34.0)
MCHC: 31.3 g/dL (ref 30.0–36.0)
MCV: 62.6 fL — ABNORMAL LOW (ref 80.0–100.0)
Monocytes Absolute: 0.9 10*3/uL (ref 0.1–1.0)
Monocytes Relative: 12 %
Neutro Abs: 4.1 10*3/uL (ref 1.7–7.7)
Neutrophils Relative %: 54 %
Platelets: 306 10*3/uL (ref 150–400)
RBC: 4.65 MIL/uL (ref 3.87–5.11)
RDW: 21.5 % — ABNORMAL HIGH (ref 11.5–15.5)
Smear Review: NORMAL
WBC: 7.5 10*3/uL (ref 4.0–10.5)
nRBC: 0 % (ref 0.0–0.2)

## 2020-03-03 LAB — GLUCOSE, CAPILLARY
Glucose-Capillary: 122 mg/dL — ABNORMAL HIGH (ref 70–99)
Glucose-Capillary: 130 mg/dL — ABNORMAL HIGH (ref 70–99)
Glucose-Capillary: 137 mg/dL — ABNORMAL HIGH (ref 70–99)
Glucose-Capillary: 228 mg/dL — ABNORMAL HIGH (ref 70–99)

## 2020-03-03 LAB — MAGNESIUM: Magnesium: 1.7 mg/dL (ref 1.7–2.4)

## 2020-03-03 MED ORDER — ASPIRIN 81 MG PO TBEC: 81.0000 mg | DELAYED_RELEASE_TABLET | Freq: Every day | ORAL | 0 refills | Status: AC

## 2020-03-03 MED ORDER — INSULIN DETEMIR 100 UNIT/ML FLEXPEN: 35.0000 [IU] | PEN_INJECTOR | Freq: Every day | SUBCUTANEOUS | Status: DC

## 2020-03-03 NOTE — Plan of Care (Signed)

## 2020-03-03 NOTE — Progress Notes (Signed)
Physical Therapy Treatment Patient Details Name: [name] MRN: [id number] DOB: 12-15-1965 Today's Date: 03/03/2020    History of Present Illness 54 y.o. female with medical history significant for recurrent CVAs with residual left hemiparesis and seizure disorder, diabetes, hypertension, urinary incontinence who was brought into the emergency room with a several day history of generalized weakness, causing falls, worsening in the past day to where patient will not get out of bed.  MRI significant for remote R MCA infarct, BUT NO ACUTE FINDINGS.    PT Comments    Patient received in bed, husband present. Patient more alert this visit. Responding more and more verbal. She performed supine to sit with cues and min assist. Sit to stand from slightly elevated bed with min assist. Ambulated 5-6 feet with RW and min guard. Cues for safety with mobility. She reports B LE pain with gait. She will continue to benefit from skilled PT while here to improve functional independence, strength and safety with mobility.     Follow Up Recommendations  Home health PT     Equipment Recommendations  None recommended by PT    Recommendations for Other Services       Precautions / Restrictions Precautions Precautions: Fall Restrictions Weight Bearing Restrictions: No    Mobility  Bed Mobility Overal bed mobility: Needs Assistance Bed Mobility: Supine to Sit     Supine to sit: Min guard     General bed mobility comments: min assist and multimodal cues for supine to sit.  Transfers Overall transfer level: Needs assistance Equipment used: Rolling walker (2 wheeled) Transfers: Sit to/from Stand Sit to Stand: From elevated surface         General transfer comment: Attempted to stand from bed in low position, was unable. Elevated bed slightly and she was able to get up with min assist.  Ambulation/Gait Ambulation/Gait assistance: Min guard Gait Distance (Feet): 5 Feet Assistive device:  Rolling walker (2 wheeled) Gait Pattern/deviations: Step-to pattern;Shuffle;Decreased step length - right;Decreased step length - left;Wide base of support Gait velocity: decr   General Gait Details: able to take 4 steps forward and then turned feet around to recliner and backed up to recliner. Cues given to wait until she feels the chair to sit down.   Stairs             Wheelchair Mobility    Modified Rankin (Stroke Patients Only) Modified Rankin (Stroke Patients Only) Pre-Morbid Rankin Score: Moderately severe disability Modified Rankin: Moderately severe disability     Balance Overall balance assessment: Needs assistance Sitting-balance support: Feet supported Sitting balance-Leahy Scale: Good     Standing balance support: Bilateral upper extremity supported;During functional activity Standing balance-Leahy Scale: Fair Standing balance comment: Patient reliant on BUE on RW and external assist for safety with mobility - VCs to maintain upright position and WB through BUE                            Cognition Arousal/Alertness: Awake/alert Behavior During Therapy: Flat affect Overall Cognitive Status: History of cognitive impairments - at baseline Area of Impairment: Attention;Problem solving;Awareness                 Orientation Level: Disoriented to;Time;Situation Current Attention Level: Sustained   Following Commands: Follows one step commands with increased time     Problem Solving: Decreased initiation;Slow processing;Requires verbal cues;Requires tactile cues General Comments: Patient requires multimodal cues for mobility. Decreased initiation, minimally verbal, decreased lethargy  this visit. Improved command following this date      Exercises Other Exercises Other Exercises: B LE exercises with assist to include: AP, heel slides, hip abd/add x 10 reps    General Comments        Pertinent Vitals/Pain Pain Assessment: Faces Faces  Pain Scale: Hurts little more Pain Location: grimacing with ambulation, reports B LEs hurt, unable to expand upon Pain Descriptors / Indicators: Grimacing Pain Intervention(s): Monitored during session    Home Living                      Prior Function            PT Goals (current goals can now be found in the care plan section) Acute Rehab PT Goals Patient Stated Goal: to get HHPT at home PT Goal Formulation: With family Time For Goal Achievement: 03/10/20 Potential to Achieve Goals: Fair Progress towards PT goals: Progressing toward goals    Frequency    Min 2X/week      PT Plan Current plan remains appropriate    Co-evaluation              AM-PAC PT "6 Clicks" Mobility   Outcome Measure  Help needed turning from your back to your side while in a flat bed without using bedrails?: A Lot Help needed moving from lying on your back to sitting on the side of a flat bed without using bedrails?: A Little Help needed moving to and from a bed to a chair (including a wheelchair)?: A Little Help needed standing up from a chair using your arms (e.g., wheelchair or bedside chair)?: A Little Help needed to walk in hospital room?: A Little Help needed climbing 3-5 steps with a railing? : A Lot 6 Click Score: 16    End of Session Equipment Utilized During Treatment: Gait belt Activity Tolerance: Patient tolerated treatment well Patient left: in chair;with chair alarm set;with family/visitor present Nurse Communication: Mobility status PT Visit Diagnosis: Unsteadiness on feet (R26.81);Muscle weakness (generalized) (M62.81);Difficulty in walking, not elsewhere classified (R26.2);Other abnormalities of gait and mobility (R26.89);Hemiplegia and hemiparesis Hemiplegia - Right/Left: Left Hemiplegia - caused by: Cerebral infarction     Time: 1040-1104 PT Time Calculation (min) (ACUTE ONLY): 24 min  Charges:  $Gait Training: 8-22 mins $Therapeutic Exercise: 8-22  mins                     Gaberiel Youngblood, PT, GCS 03/03/20,11:20 AM

## 2020-03-03 NOTE — Progress Notes (Signed)
Pt d/c to home via husband. IV removed intact. VSS. Education completed. All questions answered. Belongings sent with pt.

## 2020-03-03 NOTE — Discharge Summary (Signed)
Physician Discharge Summary  [name] XAJ:[id number] DOB: April 23, 1966 DOA: 02/24/2020  PCP: [facility]  Admit date: 02/24/2020 Discharge date: 03/03/2020  Admitted From: Home Disposition: Home  Recommendations for Outpatient Follow-up:  1. Follow up with PCP in 1 week with repeat CBC/BMP 2. Outpatient follow-up with neurology 3. Will need outpatient evaluation by pulmonary and possible sleep study 4. Follow up in ED if symptoms worsen or new appear   Home Health: No Equipment/Devices: None  Discharge Condition: Stable CODE STATUS: Full Diet recommendation: Heart healthy/carb modified as per SLP recommendations: Diet recommendations: Dysphagia 2 (fine chop);Nectar-thick liquid Liquids provided via: Cup;Straw (monitor) Medication Administration: Whole meds with puree (for safer swallowing) Supervision: Staff to assist with self feeding;Full supervision/cueing for compensatory strategies Compensations: Minimize environmental distractions;Slow rate;Small sips/bites;Lingual sweep for clearance of pocketing;Follow solids with liquid Postural Changes and/or Swallow Maneuvers: Seated upright 90 degrees;Upright 30-60 min after meal (Reflux precautions)  Brief/Interim Summary: 54 year old female with history of depression, diabetes, epilepsy, hypertension, hyperlipidemia, history of prior nonhemorrhagic stroke presented with fatigue and weakness with reported facial droop and slurred speech with left upper extremity weakness.  Neurology was consulted.  During the hospitalization, MRI of the brain did not show signs of acute stroke.  She was also found to have left lobar pneumonia on CT scan for which she was treated with IV antibiotics and switched to oral antibiotics.  PT recommended SNF placement.  Unfortunately, she did not qualify for SNF.  She has completed antibiotic course.  She will be discharged home with home and PT/OT.  She will need outpatient neurology  evaluation and follow-up and also recommend outpatient pulmonary evaluation for possible sleep study.  Discharge Diagnoses:   Acute metabolic encephalopathy -Probably from pneumonia.  MRI of the brain did not show any acute stroke.  EEG was negative for any seizure activity.  Depakote level was normal.  Neurontin on hold -Still sleepy and slow to respond but improving. -Diet as per SLP recommendations: On dysphagia 2 diet with nectar thick liquid -PT recommending SNF placement: As per case management, patient does not qualify for SNF placement.  Will need home health PT/OT on discharge. -Discharge home today with PT/OT  Left lobar pneumonia -Seen on CT scan.  Initially treated with Unasyn which has been switched to oral Augmentin.   DC antibiotics afterwards.  Also received 4 days of Zithromax as well. -Currently respiratory status is stable -Patient has completed 7 days of antibiotic therapy.  No antibiotic need on discharge  Acute kidney injury Dehydration -Resolved.    Prior nonhemorrhagic stroke with encephalomalacia -Continue aspirin, Plavix and statin  History of seizure disorder -Continue Depakote.  Outpatient follow-up with neurology  Diabetes mellitus type 2 -Carb modified diet.  Resume home regimen.  Outpatient follow-up  Morbid obesity -Outpatient follow-up  Nonsustained ventricular tachycardia -EF normal.  Currently heart rate controlled.  Monitor electrolytes intermittently as an outpatient.  Iron deficiency anemia -Continue oral iron supplementation  Probable obstructive sleep apnea -Patient is morbidly obese and as per the husband, patient sleeps quite a bit at home.  Probably will need outpatient sleep study.   Essential hypertension -Monitor blood pressure.  Continue metoprolol.  Resume lisinopril on discharge.   Discharge Instructions  Discharge Instructions    Diet - low sodium heart healthy   Complete by: As directed    Diet Carb Modified    Complete by: As directed    Increase activity slowly   Complete by: As directed  Allergies as of 03/03/2020   No Known Allergies     Medication List    STOP taking these medications   escitalopram 10 MG tablet Commonly known as: LEXAPRO   gabapentin 300 MG capsule Commonly known as: NEURONTIN     TAKE these medications   aspirin 81 MG EC tablet Take 1 tablet (81 mg total) by mouth daily. Swallow whole. Start taking on: March 04, 2020 What changed:   medication strength  how much to take  additional instructions   atorvastatin 40 MG tablet Commonly known as: LIPITOR Take 1 tablet (40 mg total) by mouth daily at 6 PM.   clopidogrel 75 MG tablet Commonly known as: PLAVIX Take 75 mg by mouth daily.   divalproex 250 MG DR tablet Commonly known as: DEPAKOTE Take 250-500 mg by mouth 2 (two) times daily. Take one tablet (250 mg) by mouth every morning and two tablets (500 mg) at bedtime   insulin aspart 100 UNIT/ML FlexPen Commonly known as: NovoLOG FlexPen Inject 10 Units into the skin 3 (three) times daily with meals. What changed: how much to take   insulin detemir 100 UNIT/ML FlexPen Commonly known as: Levemir FlexPen Inject 35 Units into the skin daily at 10 pm.   lisinopril 5 MG tablet Commonly known as: ZESTRIL Take 5 mg by mouth daily.   metFORMIN 500 MG 24 hr tablet Commonly known as: GLUCOPHAGE-XR Take 500 mg by mouth 2 (two) times daily.   metoprolol tartrate 25 MG tablet Commonly known as: LOPRESSOR Take 25 mg by mouth 2 (two) times a day.   saccharomyces boulardii 250 MG capsule Commonly known as: FLORASTOR Take 250 mg by mouth 2 (two) times daily.   VESIcare 10 MG tablet Generic drug: solifenacin Take 10 mg by mouth 2 (two) times daily.       Follow-up [place], [facility]. Schedule an appointment as soon as possible for a visit in 1 week(s).   Specialty: General Practice Why: Patient with  CBC/BMP Contact information: [place]. [place] [address] [phone]        Neurologist. Schedule an appointment as soon as possible for a visit in 1 week(s).        Pulmonary. Schedule an appointment as soon as possible for a visit in 1 week(s).   Why: Will need outpatient sleep study             No Known Allergies  Consultations:  Neurology   Procedures/Studies: EEG  Result Date: 02/26/2020 Alexis Goodell, MD     02/26/2020 11:38 PM ELECTROENCEPHALOGRAM REPORT Patient: Milaina Sher       Room #: 916X-IH EEG No. ID: [id number] Age: 54 y.o.        Sex: female Requesting Physician: Leslye Peer Report Date:  02/26/2020       Interpreting Physician: Alexis Goodell History: Reagyn Facemire is an 54 y.o. female with altered mental status Medications: Depakote, Insulin, ASA, Lipitor, Rocephin, Zithromax Conditions of Recording:  This is a 21 channel routine scalp EEG performed with bipolar and monopolar montages arranged in accordance to the international 10/20 system of electrode placement. One channel was dedicated to EKG recording. The patient is in the lethargic state. Description:  The background activity is slow and poorly organized.  This activity Iow voltage and consists of activity in the delta-theta continuum.   This activity is diffusely distributed and continuous. No epileptiform activity is noted. Hyperventilation was not performed.  Intermittent photic stimulation  was performed but failed to illicit any change in the tracing. IMPRESSION: This is an abnormal EEG secondary to general background slowing.  This finding may be seen with a diffuse disturbance that is etiologically nonspecific, but may include a metabolic encephalopathy, among other possibilities.  No epileptiform activity is noted.  Thana Farr, MD Neurology [phone] 02/26/2020, 11:33 PM   CT Head Wo Contrast  Result Date: 02/24/2020 CLINICAL DATA:  Ataxia. Stroke suspected episode of left-sided  weakness greater than baseline which has since resolved. EXAM: CT HEAD WITHOUT CONTRAST TECHNIQUE: Contiguous axial images were obtained from the base of the skull through the vertex without intravenous contrast. COMPARISON:  CT head without contrast 12/20/2019 and MR head without contrast 12/21/2019. FINDINGS: Brain: The remote right MCA territory infarct is stable. Ex vacuo dilation of the right lateral ventricle is again noted. Remote ischemic changes are present in the internal capsule. Mild periventricular white matter changes on the left are stable. A remote lacunar infarct in the left paramedian pons is stable. Brainstem and cerebellum are otherwise normal. Vascular: Atherosclerotic changes are again noted within the cavernous internal carotid arteries bilaterally. No hyperdense vessel is present. Skull: Calvarium is intact. No focal lytic or blastic lesions are present. No significant extracranial soft tissue lesion is present. Sinuses/Orbits: The paranasal sinuses and mastoid air cells are clear. The globes and orbits are within normal limits. IMPRESSION: 1. No acute intracranial abnormality or significant interval change. 2. Stable remote right MCA territory infarct. 3. Remote lacunar infarct in the left paramedian pons. 4. Atherosclerosis. Electronically Signed   By: Marin Roberts M.D.   On: 02/24/2020 15:29   CT ANGIO CHEST PE W OR WO CONTRAST  Result Date: 02/27/2020 CLINICAL DATA:  Increased shortness of breath. Evaluate for pulmonary embolism EXAM: CT ANGIOGRAPHY CHEST WITH CONTRAST TECHNIQUE: Multidetector CT imaging of the chest was performed using the standard protocol during bolus administration of intravenous contrast. Multiplanar CT image reconstructions and MIPs were obtained to evaluate the vascular anatomy. CONTRAST:  OMNIPAQUE IOHEXOL 350 MG/ML SOLN COMPARISON:  Chest radiograph-02/25/2020 FINDINGS: Examination is degraded due to patient respiratory artifact Vascular  Findings: There is adequate opacification of the pulmonary arterial system with the main pulmonary artery measuring 231 Hounsfield units. There are no discrete filling defects within the pulmonary arterial tree to the level of the bilateral segmental pulmonary arteries. Evaluation of the segmental and subsegmental pulmonary arteries is degraded secondary to patient respiratory artifact. Normal caliber of the main pulmonary artery. Cardiomegaly, in particular, there is enlargement the left ventricle. Extensive coronary artery calcifications. No pericardial effusion though a small amount of fluid seen with the pericardial recess. No evidence of thoracic aortic aneurysm or dissection on this nongated examination. Conventional configuration of the aortic arch. Minimal amount of atherosclerotic plaque within normal caliber descending thoracic aorta. Review of the MIP images confirms the above findings. ---------------------------------------------------------------------------------- Nonvascular Findings: Mediastinum/Lymph Nodes: Scattered mediastinal and left hilar lymph nodes are prominent though individually not enlarged by size criteria with index AP window lymph node measuring 0.9 cm in greatest short axis diameter (image 27, series 4) and index left infrahilar lymph node measuring 0.8 cm (image 36, series 4) presumably reactive in etiology. No bulky mediastinal, hilar or axillary lymph adenopathy on this motion degraded examination. Lungs/Pleura: Heterogeneous slightly nodular airspace opacities are seen within the left lower lobe with relative subpleural sparing. No additional discrete airspace opacities are identified. No pleural effusion or pneumothorax. The central pulmonary airways appear patent with special attention paid to  left lower lobe bronchi. Evaluation for discrete pulmonary nodules is degraded secondary to patient respiratory artifact and parenchymal abnormalities. Upper abdomen: Limited arterial  phase of evaluation of the upper abdomen is normal. Musculoskeletal: No acute or aggressive osseous abnormalities. Stigmata of dish within the thoracic spine. Regional soft tissues appear normal. Normal appearance of the thyroid gland. IMPRESSION: 1. No evidence of central pulmonary embolism. 2. Rather extensive left lower lobe heterogeneous nodular airspace opacities with relative subpleural sparing nonspecific though most suggestive of developing infection (including atypical etiologies) or aspiration. Follow-up chest radiograph in 3-4 weeks is recommended to ensure resolution. 3. Extensive coronary artery calcifications. Aortic Atherosclerosis (ICD10-I70.0). 4. Marked cardiomegaly. Electronically Signed   By: Simonne Come M.D.   On: 02/27/2020 09:43   MR ANGIO HEAD WO CONTRAST  Result Date: 02/24/2020 CLINICAL DATA:  54 year old female with left side weakness, ataxia. EXAM: MRA HEAD WITHOUT CONTRAST TECHNIQUE: Angiographic images of the Circle of Willis were obtained using MRA technique without intravenous contrast. COMPARISON:  Head CT earlier today. Intracranial MRA 07/24/2019. Brain MRI 12/21/2019. FINDINGS: Study is moderately degraded by motion artifact despite repeated imaging attempts, similar to the November comparison. Antegrade flow in the posterior circulation appears stable with dominant left vertebral artery. The right vertebral probably functionally terminates in PICA. Obscured proximal basilar artery today, but the distal basilar is patent. Basilar tip and PCA origins remain patent. Degraded PCA branch detail, PCAs appear grossly stable since November. Antegrade flow in both ICA siphons, with obscured petrous ICA segments today. Partially obscured supraclinoid segments. Bilateral carotid termini remain patent. Bilateral M1 and A1 segments remain patent. Better visualization of the bilateral ACA branches today which appear normal. MCA branch detail is obscured similar to the prior exam but there  is probably asymmetric right MCA stenosis in the setting of chronic right MCA territory ischemia. The left M1 seems stable and within normal limits. IMPRESSION: 1. Motion degraded intracranial MRA similar to that in November. Follow-up Head And Neck CTA with contrast would be valuable in light of inability to tolerate MRA. 2. No emergent large vessel occlusion suspected. 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 Electronically Signed   By: Odessa Fleming M.D.   On: 02/24/2020 22:25   MR BRAIN WO CONTRAST  Result Date: 02/25/2020 CLINICAL DATA:  Focal neurologic deficit EXAM: MRI HEAD WITHOUT CONTRAST TECHNIQUE: Multiplanar, multiecho pulse sequences of the brain and surrounding structures were obtained without intravenous contrast. COMPARISON:  None. FINDINGS: The examination is severely motion degraded. There is no acute ischemia. No midline shift or other mass effect. There is a large area of encephalomalacia in right temporal lobe. IMPRESSION: 1. Severely motion degraded examination. 2. No acute ischemia. 3. Large area of encephalomalacia in the right temporal lobe. Electronically Signed   By: Deatra Robinson M.D.   On: 02/25/2020 00:10   US Carotid Bilateral (at [facility] and [facility] only)  Result Date: 02/25/2020 CLINICAL DATA:  [name].  Hypertension, hyperlipidemia, diabetes EXAM: BILATERAL CAROTID DUPLEX ULTRASOUND TECHNIQUE: Wallace Cullens scale imaging, color Doppler and duplex ultrasound were performed of bilateral carotid and vertebral arteries in the neck. Technologist describes technically difficult study secondary to unresponsive patient with coughing fits. COMPARISON:  07/24/2019 FINDINGS: Criteria: Quantification of carotid stenosis is based on velocity parameters that correlate the residual internal carotid diameter with NASCET-based stenosis levels, using the diameter of the distal internal carotid lumen as the denominator for stenosis measurement. The following  velocity measurements were obtained: RIGHT ICA:  57/20 cm/sec CCA: 89/16 cm/sec SYSTOLIC ICA/CCA RATIO:  0.7 ECA: 320 cm/sec LEFT ICA: 87/27 cm/sec CCA: 159/26 cm/sec SYSTOLIC ICA/CCA RATIO:  0.5 ECA: 166 cm/sec RIGHT CAROTID ARTERY: Partially calcified plaque in the bulb, extending into proximal ECA and ICA. Elevated peak systolic velocities just beyond the ECA origin. Normal waveforms and color Doppler signal in the ICA, without high-grade stenosis evident. RIGHT VERTEBRAL ARTERY:  Normal flow direction and waveform. LEFT CAROTID ARTERY: Eccentric partially calcified plaque in the bulb and ICA origin, without high-grade stenosis. Normal waveforms and color Doppler signal. Mild tortuosity. LEFT VERTEBRAL ARTERY:  Normal flow direction and waveform. IMPRESSION: 1. Bilateral carotid bifurcation plaque resulting in less than 50% diameter ICA stenosis. 2. 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 3. Antegrade bilateral vertebral arterial flow. Electronically Signed   By: Corlis Leak M.D.   On: 02/25/2020 09:54   DG Chest Port 1 View  Result Date: 02/25/2020 CLINICAL DATA:  Altered mental status.  Lethargic. EXAM: PORTABLE CHEST 1 VIEW COMPARISON:  Chest x-ray 02/24/2020 FINDINGS: The heart is enlarged but stable. Stable tortuosity of the thoracic aorta. Persistent left basilar density, atelectasis versus infiltrate. No definite pleural effusions. No worrisome pulmonary lesions. IMPRESSION: Persistent left basilar density, atelectasis versus infiltrate. Electronically Signed   By: Rudie Meyer M.D.   On: 02/25/2020 13:11   DG Chest Portable 1 View  Result Date: 02/24/2020 CLINICAL DATA:  Weakness, facial droop, slurred speech EXAM: PORTABLE CHEST 1 VIEW COMPARISON:  12/20/2019 FINDINGS: Single frontal view of the chest demonstrates an unremarkable cardiac silhouette. There is increased density in the retrocardiac region which may reflect atelectasis or airspace  disease. No effusion or pneumothorax. No acute bony abnormalities. IMPRESSION: 1. New retrocardiac consolidation compatible with airspace disease or atelectasis. Electronically Signed   By: Sharlet Salina M.D.   On: 02/24/2020 17:24   ECHOCARDIOGRAM COMPLETE  Result Date: 02/25/2020    ECHOCARDIOGRAM REPORT   Patient Name:   JEANISE DURFEY Date of Exam: 02/25/2020 Medical Rec #:  [id number]  Height:       62.0 in Accession #:    [id number] Weight:       280.0 lb Date of Birth:  07-30-1966  BSA:          2.206 m Patient Age:    53 years   BP:           174/92 mmHg Patient Gender: F          HR:           97 bpm. Exam Location:  [facility] Procedure: 2D Echo, Limited Color Doppler and Cardiac Doppler Indications:     G45.9 [name]  History:         Patient has prior history of Echocardiogram examinations, most                  recent 07/24/2019. Stroke; Risk Factors:Dyslipidemia, Diabetes                  and Hypertension.  Sonographer:     Humphrey Rolls RDCS (AE) Referring Phys:  Kenn File DOUTOVA Diagnosing Phys: Julien Nordmann MD  Sonographer Comments: Image acquisition challenging due to patient body habitus. IMPRESSIONS  1. Left ventricular ejection fraction, by estimation, is 60 to 65%. The left ventricle has normal function. The left ventricle has no regional wall motion abnormalities. Left ventricular diastolic parameters are consistent with Grade I diastolic dysfunction (impaired relaxation).  2. Right ventricular systolic function  is normal. The right ventricular size is normal.  3. The aortic valve is normal in structure. Aortic valve regurgitation is not visualized. Mild aortic valve sclerosis is present, with no evidence of aortic valve stenosis. FINDINGS  Left Ventricle: Left ventricular ejection fraction, by estimation, is 60 to 65%. The left ventricle has normal function. The left ventricle has no regional wall motion abnormalities. The left ventricular internal cavity size was normal in size. There is  no left  ventricular hypertrophy. Left ventricular diastolic parameters are consistent with Grade I diastolic dysfunction (impaired relaxation). Right Ventricle: The right ventricular size is normal. No increase in right ventricular wall thickness. Right ventricular systolic function is normal. Left Atrium: Left atrial size was normal in size. Right Atrium: Right atrial size was normal in size. Pericardium: There is no evidence of pericardial effusion. Mitral Valve: The mitral valve is normal in structure. Normal mobility of the mitral valve leaflets. No evidence of mitral valve regurgitation. No evidence of mitral valve stenosis. MV peak gradient, 11.8 mmHg. The mean mitral valve gradient is 4.0 mmHg. Tricuspid Valve: The tricuspid valve is normal in structure. Tricuspid valve regurgitation is mild . No evidence of tricuspid stenosis. Aortic Valve: The aortic valve is normal in structure. Aortic valve regurgitation is not visualized. Mild aortic valve sclerosis is present, with no evidence of aortic valve stenosis. Aortic valve mean gradient measures 6.0 mmHg. Aortic valve peak gradient measures 12.8 mmHg. Aortic valve area, by VTI measures 1.98 cm. Pulmonic Valve: The pulmonic valve was normal in structure. Pulmonic valve regurgitation is not visualized. No evidence of pulmonic stenosis. Aorta: The aortic root is normal in size and structure. Venous: The inferior vena cava is normal in size with greater than 50% respiratory variability, suggesting right atrial pressure of 3 mmHg. IAS/Shunts: No atrial level shunt detected by color flow Doppler.  LEFT VENTRICLE PLAX 2D LVIDd:         4.16 cm  Diastology LVIDs:         2.84 cm  LV e' lateral:   9.36 cm/s LV PW:         0.94 cm  LV E/e' lateral: 10.9 LV IVS:        0.84 cm  LV e' medial:    12.80 cm/s LVOT diam:     1.90 cm  LV E/e' medial:  8.0 LV SV:         50 LV SV Index:   22 LVOT Area:     2.84 cm  RIGHT VENTRICLE RV Basal diam:  3.27 cm LEFT ATRIUM             Index  LA diam:        3.25 cm 1.47 cm/m LA Vol (A2C):   28.8 ml 13.06 ml/m LA Vol (A4C):   35.7 ml 16.19 ml/m LA Biplane Vol: 35.0 ml 15.87 ml/m  AORTIC VALVE                    PULMONIC VALVE AV Area (Vmax):    1.71 cm     PV Vmax:       1.57 m/s AV Area (Vmean):   1.63 cm     PV Vmean:      102.000 cm/s AV Area (VTI):     1.98 cm     PV VTI:        0.226 m AV Vmax:           179.00 cm/s  PV Peak grad:  9.9 mmHg AV Vmean:          116.000 cm/s PV Mean grad:  5.0 mmHg AV VTI:            0.250 m AV Peak Grad:      12.8 mmHg AV Mean Grad:      6.0 mmHg LVOT Vmax:         108.00 cm/s LVOT Vmean:        66.700 cm/s LVOT VTI:          0.175 m LVOT/AV VTI ratio: 0.70  AORTA Ao Root diam: 2.80 cm MITRAL VALVE MV Area (PHT): 5.27 cm     SHUNTS MV Peak grad:  11.8 mmHg    Systemic VTI:  0.18 m MV Mean grad:  4.0 mmHg     Systemic Diam: 1.90 cm MV Vmax:       1.72 m/s MV Vmean:      87.5 cm/s MV Decel Time: 144 msec MV E velocity: 102.00 cm/s MV A velocity: 121.00 cm/s MV E/A ratio:  0.84 Julien Nordmann MD Electronically signed by Julien Nordmann MD Signature Date/Time: 02/25/2020/3:28:11 PM    Final        Subjective: Patient seen and examined at bedside.  More awake this morning, denies any current pain.  Poor historian, does not participate in conversation much.  No overnight fever or vomiting reported.  Discharge Exam: Vitals:   03/03/20 0442 03/03/20 0745  BP: 126/83 (!) 156/79  Pulse: 81 86  Resp: 16 17  Temp: 98.8 F (37.1 C) 98.3 F (36.8 C)  SpO2: 96% 95%    General: Pt is awake, very slow to respond, poor historian.  No distress. Cardiovascular: rate controlled, S1/S2 + Respiratory: bilateral decreased breath sounds at bases Abdominal: Soft, morbidly obese, NT, ND, bowel sounds + Extremities: Trace lower extremity edema present; no cyanosis    The results of significant diagnostics from this hospitalization (including imaging, microbiology, ancillary and laboratory) are listed below for  reference.     Microbiology: Recent Results (from the past 240 hour(s))  SARS Coronavirus 2 by RT PCR (hospital order, performed in [facility] hospital lab) Nasopharyngeal Nasopharyngeal Swab     Status: None   Collection Time: 02/24/20  6:06 PM   Specimen: Nasopharyngeal Swab  Result Value Ref Range Status   SARS Coronavirus 2 NEGATIVE NEGATIVE Final    Comment: (NOTE) SARS-CoV-2 target nucleic acids are NOT DETECTED. The SARS-CoV-2 RNA is generally detectable in upper and lower respiratory specimens during the acute phase of infection. The lowest concentration of SARS-CoV-2 viral copies this assay can detect is 250 copies / mL. A negative result does not preclude SARS-CoV-2 infection and should not be used as the sole basis for treatment or other patient management decisions.  A negative result may occur with improper specimen collection / handling, submission of specimen other than nasopharyngeal swab, presence of viral mutation(s) within the areas targeted by this assay, and inadequate number of viral copies (<250 copies / mL). A negative result must be combined with clinical observations, patient history, and epidemiological information. Fact Sheet for Patients:   BoilerBrush.com.cy Fact Sheet for Healthcare Providers: https://pope.com/ This test is not yet approved or cleared  by the [place] FDA and has been authorized for detection and/or diagnosis of SARS-CoV-2 by FDA under an Emergency Use Authorization (EUA).  This EUA will remain in effect (meaning this test can be used) for the duration of the COVID-19 declaration under Section 564(b)(1) of the  Act, 21 U.S.C. section 360bbb-3(b)(1), unless the authorization is terminated or revoked sooner. Performed at [facility], [address] [place], [place] [address]      Labs: BNP (last 3 results) Recent Labs    07/23/19 1128  BNP 34.0   Basic Metabolic  Panel: Recent Labs  Lab 02/26/20 0808 02/27/20 0712 02/28/20 0505 02/29/20 0659 03/03/20 0544  NA 138 140 144 146* 143  K 3.4* 3.4* 4.6 3.7 4.0  CL 103 102 109 110 106  CO2 24 25 25 27 27   GLUCOSE 182* 160* 195* 162* 131*  BUN 13 14 17 11 15   CREATININE 0.95 0.90 1.08* 0.85 0.84  CALCIUM 8.4* 8.6* 8.5* 8.7* 9.1  MG  --  1.8 2.0 2.0 1.7   Liver Function Tests: Recent Labs  Lab 03/03/20 0544  AST 17  ALT 27  ALKPHOS 43  BILITOT 0.5  PROT 6.9  ALBUMIN 3.1*   No results for input(s): LIPASE, AMYLASE in the last 168 hours. No results for input(s): AMMONIA in the last 168 hours. CBC: Recent Labs  Lab 02/26/20 0808 02/29/20 0659 03/03/20 0544  WBC 13.4* 7.4 7.5  NEUTROABS  --   --  4.1  HGB 9.0* 8.7* 9.1*  HCT 28.7* 27.0* 29.1*  MCV 64.1* 62.1* 62.6*  PLT 165 200 306   Cardiac Enzymes: No results for input(s): CKTOTAL, CKMB, CKMBINDEX, TROPONINI in the last 168 hours. BNP: Invalid input(s): POCBNP CBG: Recent Labs  Lab 03/02/20 1630 03/02/20 2040 03/03/20 0039 03/03/20 0441 03/03/20 0745  GLUCAP 185* 242* 228* 137* 122*   D-Dimer No results for input(s): DDIMER in the last 72 hours. Hgb A1c No results for input(s): HGBA1C in the last 72 hours. Lipid Profile No results for input(s): CHOL, HDL, LDLCALC, TRIG, CHOLHDL, LDLDIRECT in the last 72 hours. Thyroid function studies No results for input(s): TSH, T4TOTAL, T3FREE, THYROIDAB in the last 72 hours.  Invalid input(s): FREET3 Anemia work up No results for input(s): VITAMINB12, FOLATE, FERRITIN, TIBC, IRON, RETICCTPCT in the last 72 hours. Urinalysis    Component Value Date/Time   COLORURINE YELLOW (A) 02/24/2020 1645   COLORURINE YELLOW (A) 02/24/2020 1645   APPEARANCEUR HAZY (A) 02/24/2020 1645   APPEARANCEUR HAZY (A) 02/24/2020 1645   APPEARANCEUR Clear 12/27/2014 1806   LABSPEC 1.014 02/24/2020 1645   LABSPEC 1.014 02/24/2020 1645   LABSPEC 1.035 12/27/2014 1806   PHURINE 7.0 02/24/2020 1645    PHURINE 7.0 02/24/2020 1645   GLUCOSEU NEGATIVE 02/24/2020 1645   GLUCOSEU NEGATIVE 02/24/2020 1645   GLUCOSEU >=500 12/27/2014 1806   HGBUR NEGATIVE 02/24/2020 1645   HGBUR NEGATIVE 02/24/2020 1645   BILIRUBINUR NEGATIVE 02/24/2020 1645   BILIRUBINUR NEGATIVE 02/24/2020 1645   BILIRUBINUR Negative 12/27/2014 1806   KETONESUR NEGATIVE 02/24/2020 1645   KETONESUR NEGATIVE 02/24/2020 1645   PROTEINUR NEGATIVE 02/24/2020 1645   PROTEINUR NEGATIVE 02/24/2020 1645   NITRITE NEGATIVE 02/24/2020 1645   NITRITE NEGATIVE 02/24/2020 1645   LEUKOCYTESUR TRACE (A) 02/24/2020 1645   LEUKOCYTESUR NEGATIVE 02/24/2020 1645   LEUKOCYTESUR Negative 12/27/2014 1806   Sepsis Labs Invalid input(s): PROCALCITONIN,  WBC,  LACTICIDVEN Microbiology Recent Results (from the past 240 hour(s))  SARS Coronavirus 2 by RT PCR (hospital order, performed in [facility] hospital lab) Nasopharyngeal Nasopharyngeal Swab     Status: None   Collection Time: 02/24/20  6:06 PM   Specimen: Nasopharyngeal Swab  Result Value Ref Range Status   SARS Coronavirus 2 NEGATIVE NEGATIVE Final    Comment: (NOTE)  SARS-CoV-2 target nucleic acids are NOT DETECTED. The SARS-CoV-2 RNA is generally detectable in upper and lower respiratory specimens during the acute phase of infection. The lowest concentration of SARS-CoV-2 viral copies this assay can detect is 250 copies / mL. A negative result does not preclude SARS-CoV-2 infection and should not be used as the sole basis for treatment or other patient management decisions.  A negative result may occur with improper specimen collection / handling, submission of specimen other than nasopharyngeal swab, presence of viral mutation(s) within the areas targeted by this assay, and inadequate number of viral copies (<250 copies / mL). A negative result must be combined with clinical observations, patient history, and epidemiological information. Fact Sheet for Patients:    BoilerBrush.com.cyhttps://www.fda.gov/media/136312/download Fact Sheet for Healthcare Providers: https://pope.com/https://www.fda.gov/media/136313/download This test is not yet approved or cleared  by the [place] FDA and has been authorized for detection and/or diagnosis of SARS-CoV-2 by FDA under an Emergency Use Authorization (EUA).  This EUA will remain in effect (meaning this test can be used) for the duration of the COVID-19 declaration under Section 564(b)(1) of the Act, 21 U.S.C. section 360bbb-3(b)(1), unless the authorization is terminated or revoked sooner. Performed at [facility], [address] [place], [place] [address]      Time coordinating discharge: 35 minutes  SIGNED:   Glade LloydKshitiz Zynia Wojtowicz, MD  [facility] 03/03/2020, 10:45 AM

## 2020-03-03 NOTE — TOC Transition Note (Signed)
Transition of Care [facility]) - CM/SW Discharge Note   Patient Details  Name: [name] MRN: [id number] Date of Birth: [birth year]  [facility] CM/SW Contact:  Allayne Butcher, RN Phone Number: 03/03/2020, 2:31 PM   Clinical Narrative:    Patient has been medically cleared for discharge home.  RNCM unable to find home health services for patient but patient's husband would like for her to have outpatient PT, OT, and speech since home health could not be arranged.  [facility] Outpatient Therapy referral faxed to [phone].  Patient's husband will provide transportation home for patient.     Final next level of care: OP Rehab Barriers to Discharge: Barriers Resolved   Patient Goals and CMS Choice Patient states their goals for this hospitalization and ongoing recovery are:: for pt get better CMS Medicare.gov Compare Post Acute Care list provided to:: Patient Represenative (must comment) Choice offered to / list presented to : Spouse  Discharge Placement                       Discharge Plan and Services In-house Referral: Clinical Social Work   Post Acute Care Choice: Skilled Nursing Facility                               Social Determinants of Health (SDOH) Interventions     Readmission Risk Interventions Readmission Risk Prevention Plan 07/25/2019  Post Dischage Appt Complete  Medication Screening Complete  Transportation Screening Complete  Some recent data might be hidden

## 2020-03-15 ENCOUNTER — Encounter: Payer: Self-pay | Admitting: Emergency Medicine

## 2020-03-15 ENCOUNTER — Emergency Department
Admission: EM | Admit: 2020-03-15 | Discharge: 2020-03-15 | Disposition: A | Discharge status: Home / Self Care | Payer: Medicaid Other | Attending: Emergency Medicine | Admitting: Emergency Medicine

## 2020-03-15 ENCOUNTER — Other Ambulatory Visit: Payer: Self-pay

## 2020-03-15 ENCOUNTER — Emergency Department: Payer: Medicaid Other

## 2020-03-15 DIAGNOSIS — W010XXA Fall on same level from slipping, tripping and stumbling without subsequent striking against object, initial encounter: Secondary | ICD-10-CM | POA: Diagnosis not present

## 2020-03-15 DIAGNOSIS — Z794 Long term (current) use of insulin: Secondary | ICD-10-CM | POA: Insufficient documentation

## 2020-03-15 DIAGNOSIS — Y939 Activity, unspecified: Secondary | ICD-10-CM | POA: Insufficient documentation

## 2020-03-15 DIAGNOSIS — E119 Type 2 diabetes mellitus without complications: Secondary | ICD-10-CM | POA: Diagnosis not present

## 2020-03-15 DIAGNOSIS — Z7982 Long term (current) use of aspirin: Secondary | ICD-10-CM | POA: Diagnosis not present

## 2020-03-15 DIAGNOSIS — Z79899 Other long term (current) drug therapy: Secondary | ICD-10-CM | POA: Insufficient documentation

## 2020-03-15 DIAGNOSIS — I10 Essential (primary) hypertension: Secondary | ICD-10-CM | POA: Insufficient documentation

## 2020-03-15 DIAGNOSIS — S6992XA Unspecified injury of left wrist, hand and finger(s), initial encounter: Secondary | ICD-10-CM | POA: Diagnosis present

## 2020-03-15 DIAGNOSIS — Y999 Unspecified external cause status: Secondary | ICD-10-CM | POA: Insufficient documentation

## 2020-03-15 DIAGNOSIS — Y92199 Unspecified place in other specified residential institution as the place of occurrence of the external cause: Secondary | ICD-10-CM | POA: Diagnosis not present

## 2020-03-15 DIAGNOSIS — S52572A Other intraarticular fracture of lower end of left radius, initial encounter for closed fracture: Secondary | ICD-10-CM | POA: Diagnosis not present

## 2020-03-15 MED ORDER — ACETAMINOPHEN-CODEINE 120-12 MG/5ML PO SOLN: 5.0000 mL | Freq: Four times a day (QID) | ORAL | 0 refills | Status: AC | PRN

## 2020-03-15 MED ORDER — OXYCODONE-ACETAMINOPHEN 5-325 MG PO TABS
1.0000 | ORAL_TABLET | ORAL | Status: DC | PRN
  Administered 2020-03-15: 1 via ORAL
  Filled 2020-03-15: qty 1

## 2020-03-15 NOTE — ED Triage Notes (Signed)
Pt with hx of stroke and husband helps with comminucation

## 2020-03-15 NOTE — ED Notes (Addendum)
See triage note  Presents s/p trip and fall  States she landed on her left wrist  positive swelling noted  Good pulses  Questionable deformity

## 2020-03-15 NOTE — ED Provider Notes (Signed)
[facility] Emergency Department Provider Note  ____________________________________________  Time seen: Approximately 9:42 AM  I have reviewed the triage vital signs and the nursing notes.   HISTORY  Chief Complaint Wrist Pain    HPI [name] is a 54 y.o. female that presents to the emergency department for evaluation of left wrist pain after a fall yesterday.  Patient was going up the stairs when she slipped and fall landing on her left wrist.  Husband was present.  She was complaining of some wrist soreness throughout the night.  He assumed that she had sprained it and had wrapped her wrist last night.  This morning she was still complaining of pain to her wrist so he brought her to the emergency department.  She does not believe that she hit her head and did not lose consciousness.  Husband states that she has been acting normal since the fall.  She has not complained of any additional pain other than her wrist.  No neck pain.  She was admitted to the hospital a couple of weeks ago for pneumonia and followed up with her provider yesterday for her hospital follow-up visit.  She feels better since her hospital stay.  Past Medical History:  Diagnosis Date  . Allergic rhinitis   . Chickenpox   . Depression   . Diabetes (HCC)   . Epilepsy (HCC)   . Headache   . HTN (hypertension)   . Hyperlipidemia   . Seizures (HCC)   . Stroke (HCC)   . Urinary incontinence     Patient Active Problem List   Diagnosis Date Noted  . Pneumonia of left lower lobe due to infectious organism   . Iron deficiency anemia   . Shortness of breath   . Lobar pneumonia (HCC)   . Weakness 12/20/2019  . Hemiparesis affecting left side as late effect of cerebrovascular accident (CVA) (HCC) 12/20/2019  . Type 2 diabetes mellitus with hyperlipidemia (HCC) 12/20/2019  . Seizure disorder as sequela of cerebrovascular accident (HCC) 12/20/2019  . Urinary incontinence 12/20/2019  .  Self-care deficit 12/20/2019  . Fall   . Lacunar stroke, acute (HCC) 07/24/2019  . Seizure (HCC) 07/24/2019  . Elevated troponin 07/24/2019  . AKI (acute kidney injury) (HCC) 07/24/2019  . Acute metabolic encephalopathy 07/23/2019  . Cerebral infarction (HCC) 08/14/2016  . Recurrent cerebrovascular accidents (CVAs) (HCC) 08/14/2016  . Delirium due to another medical condition 08/13/2016  . Urinary tract infection 08/13/2016  . Right foot infection 05/11/2016  . Foot abscess, right 05/11/2016  . Anxiety and depression 12/02/2015  . CVA (cerebral infarction) 11/27/2015  . Epilepsy (HCC) 06/17/2015  . 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 Headache 05/19/2015  . Weakness of left side of body 05/19/2015  . History of cardioembolic cerebrovascular accident (CVA) 05/19/2015  . Morbid obesity with BMI of 40.0-44.9, adult (HCC) 03/19/2014  . HTN (hypertension)   . Diabetes mellitus without complication (HCC)   . Hyperlipidemia   . [name] (transient ischemic attack) 03/17/2014    Past Surgical History:  Procedure Laterality Date  . CESAREAN SECTION    . INCISION AND DRAINAGE Right 05/13/2016   Procedure: INCISION AND DRAINAGE;  Surgeon: Gwyneth Revels, DPM;  Location: [facility] ORS;  Service: Podiatry;  Laterality: Right;    Prior to Admission medications   Medication Sig Start Date End Date Taking? Authorizing Provider  aspirin EC 81 MG EC tablet Take 1 tablet (81 mg total) by mouth daily. Swallow whole. 03/04/20   Glade Lloyd, MD  atorvastatin (LIPITOR) 40 MG tablet Take 1 tablet (40 mg total) by mouth daily at 6 PM. 08/16/16   Ghimire, Werner Lean, MD  clopidogrel (PLAVIX) 75 MG tablet Take 75 mg by mouth daily. 12/22/19   [provider]  divalproex (DEPAKOTE) 250 MG DR tablet Take 250-500 mg by mouth 2 (two) times daily. Take one tablet (250 mg) by mouth every morning and two tablets (500 mg) at bedtime 07/13/19   [provider]  insulin aspart (NOVOLOG FLEXPEN) 100 UNIT/ML  FlexPen Inject 10 Units into the skin 3 (three) times daily with meals. Patient taking differently: Inject 15 Units into the skin 3 (three) times daily with meals.  08/16/16   Ghimire, Werner Lean, MD  insulin detemir (LEVEMIR FLEXPEN) 100 UNIT/ML FlexPen Inject 35 Units into the skin daily at 10 pm. 03/03/20   Glade Lloyd, MD  lisinopril (ZESTRIL) 5 MG tablet Take 5 mg by mouth daily.    [provider]  metFORMIN (GLUCOPHAGE-XR) 500 MG 24 hr tablet Take 500 mg by mouth 2 (two) times daily. 08/23/19   [provider]  metoprolol tartrate (LOPRESSOR) 25 MG tablet Take 25 mg by mouth 2 (two) times a day.    [provider]  saccharomyces boulardii (FLORASTOR) 250 MG capsule Take 250 mg by mouth 2 (two) times daily.    [provider]  VESICARE 10 MG tablet Take 10 mg by mouth 2 (two) times daily. 05/26/19   [provider]    Allergies Patient has no known allergies.  Family History  Problem Relation Age of Onset  . Hypertension Mother   . Diabetes Mellitus II Mother   . Hypertension Father   . Pancreatic cancer Father     Social History Social History   Tobacco Use  . Smoking status: Never Smoker  . Smokeless tobacco: Never Used  Substance Use Topics  . Alcohol use: No    Alcohol/week: 0.0 standard drinks  . Drug use: No     Review of Systems  Constitutional: No fever/chills ENT: No upper respiratory complaints. Cardiovascular: No chest pain. Respiratory: No SOB. Gastrointestinal: No abdominal pain.  No nausea, no vomiting.  Musculoskeletal: Positive for wrist pain. Skin: Negative for rash, abrasions, lacerations, ecchymosis.   ____________________________________________   PHYSICAL EXAM:  VITAL SIGNS: ED Triage Vitals  Enc Vitals Group     BP 03/15/20 0823 120/63     Pulse Rate 03/15/20 0823 93     Resp 03/15/20 0823 18     Temp 03/15/20 0823 98.3 F (36.8 C)     Temp Source 03/15/20 0823 Oral     SpO2 03/15/20 0823  97 %     Weight 03/15/20 0818 244 lb (110.7 kg)     Height 03/15/20 0818 5\' 2"  (1.575 m)     Head Circumference --      Peak Flow --      Pain Score 03/15/20 0817 9     Pain Loc --      Pain Edu? --      Excl. in GC? --      Constitutional: Alert and oriented. Well appearing and in no acute distress. Eyes: Conjunctivae are normal. PERRL. EOMI. Head: Atraumatic. ENT:      Ears:      Nose: No congestion/rhinnorhea.      Mouth/Throat: Mucous membranes are moist.  Neck: No stridor.   Cardiovascular: Normal rate, regular rhythm.  Good peripheral circulation.  Symmetric radial pulses bilaterally. Respiratory: Normal respiratory effort  without tachypnea or retractions. Lungs CTAB. Good air entry to the bases with no decreased or absent breath sounds. Gastrointestinal: Bowel sounds 4 quadrants. Soft and nontender to palpation. No guarding or rigidity. No palpable masses. No distention.  Musculoskeletal: Full range of motion to all extremities. No gross deformities appreciated. Swelling to left wrist. Neurologic:  Normal speech and language. No gross focal neurologic deficits are appreciated.  Skin:  Skin is warm, dry and intact. No rash noted. Psychiatric: Mood and affect are normal. Speech and behavior are normal. Patient exhibits appropriate insight and judgement.   ____________________________________________   LABS (all labs ordered are listed, but only abnormal results are displayed)  Labs Reviewed - No data to display ____________________________________________  EKG   ____________________________________________  RADIOLOGY Lexine Baton, personally viewed and evaluated these images (plain radiographs) as part of my medical decision making, as well as reviewing the written report by the radiologist.  DG Wrist Complete Left  Result Date: 03/15/2020 CLINICAL DATA:  Fall yesterday with wrist pain, initial encounter EXAM: LEFT WRIST - COMPLETE 3+ VIEW COMPARISON:  None.  FINDINGS: Comminuted distal radial fracture is noted with intra-articular extension. Mild impaction is noted. No significant angulation is seen. Generalized soft tissue swelling is noted. IMPRESSION: Comminuted distal radial fracture with intra-articular extension. No other fractures are seen. No significant angulation is noted. Electronically Signed   By: Alcide Clever M.D.   On: 03/15/2020 08:49    ____________________________________________    PROCEDURES  Procedure(s) performed:    Procedures    Medications  oxyCODONE-acetaminophen (PERCOCET/ROXICET) 5-325 MG per tablet 1 tablet (1 tablet Oral Given 03/15/20 0930)     ____________________________________________   INITIAL IMPRESSION / ASSESSMENT AND PLAN / ED COURSE  Pertinent labs & imaging results that were available during my care of the patient were reviewed by me and considered in my medical decision making (see chart for details).  Review of the [place] CSRS was performed in accordance of the [facility] prior to dispensing any controlled drugs.   Patient's diagnosis is consistent with distal radius fracture. Wrist splint was placed. Patient will be discharged home with prescriptions for a low-dose of tylenol with codeine. Patient is to follow up with orthopedics as directed.  Referral was given.  Patient is given ED precautions to return to the ED for any worsening or new symptoms.   [name] was evaluated in Emergency Department on 03/15/2020 for the symptoms described in the history of present illness. She was evaluated in the context of the global COVID-19 pandemic, which necessitated consideration that the patient might be at risk for infection with the SARS-CoV-2 virus that causes COVID-19. Institutional protocols and algorithms that pertain to the evaluation of patients at risk for COVID-19 are in a state of rapid change based on information released by regulatory bodies including the CDC and federal and state organizations.  These policies and algorithms were followed during the patient's care in the ED.  ____________________________________________  FINAL CLINICAL IMPRESSION(S) / ED DIAGNOSES  Final diagnoses:  None      NEW MEDICATIONS STARTED DURING THIS VISIT:  ED Discharge Orders    None          This chart was dictated using voice recognition software/Dragon. Despite best efforts to proofread, errors can occur which can change the meaning. Any change was purely unintentional.    Enid Derry, PA-C 03/15/20 1600    Jene Every, MD 03/17/20 [phone]

## 2020-03-15 NOTE — Discharge Instructions (Signed)
You have a fracture of your distal radius.  Please wear splint and sling.  Please call orthopedics today for a follow-up appointment this week.  Orthopedics will likely need to cast your wrist.

## 2020-03-15 NOTE — ED Triage Notes (Signed)
Pt reports fell yesterday stepping into the house when she tripped and hurt her left wrist. Pt has wrist wrapped in ace bandage currently, denies LOC or other injuries. She caught herself with her left hand.

## 2020-03-19 ENCOUNTER — Inpatient Hospital Stay: Payer: Self-pay

## 2020-03-19 ENCOUNTER — Emergency Department: Payer: Medicaid Other

## 2020-03-19 ENCOUNTER — Inpatient Hospital Stay
Admission: EM | Admit: 2020-03-19 | Discharge: 2020-03-28 | DRG: 070 | Disposition: A | Discharge status: Skilled Nursing Facility | Payer: Medicaid Other | Attending: Internal Medicine | Admitting: Internal Medicine

## 2020-03-19 ENCOUNTER — Other Ambulatory Visit: Payer: Self-pay

## 2020-03-19 DIAGNOSIS — E119 Type 2 diabetes mellitus without complications: Secondary | ICD-10-CM

## 2020-03-19 DIAGNOSIS — D509 Iron deficiency anemia, unspecified: Secondary | ICD-10-CM | POA: Diagnosis present

## 2020-03-19 DIAGNOSIS — I10 Essential (primary) hypertension: Secondary | ICD-10-CM | POA: Diagnosis present

## 2020-03-19 DIAGNOSIS — N390 Urinary tract infection, site not specified: Secondary | ICD-10-CM | POA: Diagnosis present

## 2020-03-19 DIAGNOSIS — Z6841 Body Mass Index (BMI) 40.0 and over, adult: Secondary | ICD-10-CM | POA: Diagnosis not present

## 2020-03-19 DIAGNOSIS — S52572A Other intraarticular fracture of lower end of left radius, initial encounter for closed fracture: Secondary | ICD-10-CM | POA: Diagnosis present

## 2020-03-19 DIAGNOSIS — Z8673 Personal history of transient ischemic attack (TIA), and cerebral infarction without residual deficits: Secondary | ICD-10-CM

## 2020-03-19 DIAGNOSIS — Z794 Long term (current) use of insulin: Secondary | ICD-10-CM

## 2020-03-19 DIAGNOSIS — E785 Hyperlipidemia, unspecified: Secondary | ICD-10-CM | POA: Diagnosis present

## 2020-03-19 DIAGNOSIS — Z5329 Procedure and treatment not carried out because of patient's decision for other reasons: Secondary | ICD-10-CM | POA: Diagnosis present

## 2020-03-19 DIAGNOSIS — R1312 Dysphagia, oropharyngeal phase: Secondary | ICD-10-CM | POA: Diagnosis present

## 2020-03-19 DIAGNOSIS — F419 Anxiety disorder, unspecified: Secondary | ICD-10-CM | POA: Diagnosis present

## 2020-03-19 DIAGNOSIS — J9601 Acute respiratory failure with hypoxia: Secondary | ICD-10-CM

## 2020-03-19 DIAGNOSIS — Z7902 Long term (current) use of antithrombotics/antiplatelets: Secondary | ICD-10-CM | POA: Diagnosis not present

## 2020-03-19 DIAGNOSIS — G40909 Epilepsy, unspecified, not intractable, without status epilepticus: Secondary | ICD-10-CM | POA: Diagnosis present

## 2020-03-19 DIAGNOSIS — Z20822 Contact with and (suspected) exposure to covid-19: Secondary | ICD-10-CM | POA: Diagnosis present

## 2020-03-19 DIAGNOSIS — A419 Sepsis, unspecified organism: Secondary | ICD-10-CM

## 2020-03-19 DIAGNOSIS — N179 Acute kidney failure, unspecified: Secondary | ICD-10-CM

## 2020-03-19 DIAGNOSIS — W1830XA Fall on same level, unspecified, initial encounter: Secondary | ICD-10-CM | POA: Diagnosis present

## 2020-03-19 DIAGNOSIS — R4182 Altered mental status, unspecified: Secondary | ICD-10-CM

## 2020-03-19 DIAGNOSIS — R296 Repeated falls: Secondary | ICD-10-CM | POA: Diagnosis present

## 2020-03-19 DIAGNOSIS — R7989 Other specified abnormal findings of blood chemistry: Secondary | ICD-10-CM | POA: Diagnosis present

## 2020-03-19 DIAGNOSIS — I639 Cerebral infarction, unspecified: Secondary | ICD-10-CM | POA: Diagnosis present

## 2020-03-19 DIAGNOSIS — Z515 Encounter for palliative care: Secondary | ICD-10-CM | POA: Diagnosis not present

## 2020-03-19 DIAGNOSIS — N3000 Acute cystitis without hematuria: Secondary | ICD-10-CM

## 2020-03-19 DIAGNOSIS — Z79899 Other long term (current) drug therapy: Secondary | ICD-10-CM

## 2020-03-19 DIAGNOSIS — Z7189 Other specified counseling: Secondary | ICD-10-CM | POA: Diagnosis not present

## 2020-03-19 DIAGNOSIS — R531 Weakness: Secondary | ICD-10-CM

## 2020-03-19 DIAGNOSIS — E1165 Type 2 diabetes mellitus with hyperglycemia: Secondary | ICD-10-CM | POA: Diagnosis present

## 2020-03-19 DIAGNOSIS — G9341 Metabolic encephalopathy: Secondary | ICD-10-CM | POA: Diagnosis present

## 2020-03-19 DIAGNOSIS — S52509A Unspecified fracture of the lower end of unspecified radius, initial encounter for closed fracture: Secondary | ICD-10-CM | POA: Diagnosis present

## 2020-03-19 DIAGNOSIS — W19XXXA Unspecified fall, initial encounter: Secondary | ICD-10-CM

## 2020-03-19 DIAGNOSIS — Z800 Family history of malignant neoplasm of digestive organs: Secondary | ICD-10-CM | POA: Diagnosis not present

## 2020-03-19 DIAGNOSIS — Z833 Family history of diabetes mellitus: Secondary | ICD-10-CM

## 2020-03-19 DIAGNOSIS — R778 Other specified abnormalities of plasma proteins: Secondary | ICD-10-CM | POA: Diagnosis not present

## 2020-03-19 DIAGNOSIS — Z7982 Long term (current) use of aspirin: Secondary | ICD-10-CM

## 2020-03-19 DIAGNOSIS — Z8249 Family history of ischemic heart disease and other diseases of the circulatory system: Secondary | ICD-10-CM | POA: Diagnosis not present

## 2020-03-19 DIAGNOSIS — Z7401 Bed confinement status: Secondary | ICD-10-CM

## 2020-03-19 DIAGNOSIS — I248 Other forms of acute ischemic heart disease: Secondary | ICD-10-CM | POA: Diagnosis present

## 2020-03-19 DIAGNOSIS — R569 Unspecified convulsions: Secondary | ICD-10-CM

## 2020-03-19 LAB — COMPREHENSIVE METABOLIC PANEL
ALT: 13 U/L (ref 0–44)
AST: 20 U/L (ref 15–41)
Albumin: 3.5 g/dL (ref 3.5–5.0)
Alkaline Phosphatase: 31 U/L — ABNORMAL LOW (ref 38–126)
Anion gap: 10 (ref 5–15)
BUN: 27 mg/dL — ABNORMAL HIGH (ref 6–20)
CO2: 26 mmol/L (ref 22–32)
Calcium: 8.8 mg/dL — ABNORMAL LOW (ref 8.9–10.3)
Chloride: 105 mmol/L (ref 98–111)
Creatinine, Ser: 1.36 mg/dL — ABNORMAL HIGH (ref 0.44–1.00)
GFR calc Af Amer: 51 mL/min — ABNORMAL LOW (ref >60)
GFR calc non Af Amer: 44 mL/min — ABNORMAL LOW (ref >60)
Glucose, Bld: 151 mg/dL — ABNORMAL HIGH (ref 70–99)
Potassium: 4 mmol/L (ref 3.5–5.1)
Sodium: 141 mmol/L (ref 135–145)
Total Bilirubin: 0.9 mg/dL (ref 0.3–1.2)
Total Protein: 7.1 g/dL (ref 6.5–8.1)

## 2020-03-19 LAB — SALICYLATE LEVEL: Salicylate Lvl: <7 mg/dL — ABNORMAL LOW (ref 7.0–30.0)

## 2020-03-19 LAB — GLUCOSE, CAPILLARY
Glucose-Capillary: 131 mg/dL — ABNORMAL HIGH (ref 70–99)
Glucose-Capillary: 103 mg/dL — ABNORMAL HIGH (ref 70–99)
Glucose-Capillary: 126 mg/dL — ABNORMAL HIGH (ref 70–99)
Glucose-Capillary: 139 mg/dL — ABNORMAL HIGH (ref 70–99)
Glucose-Capillary: 157 mg/dL — ABNORMAL HIGH (ref 70–99)

## 2020-03-19 LAB — URINE DRUG SCREEN, QUALITATIVE (ARMC ONLY)
Amphetamines, Ur Screen: NOT DETECTED
Barbiturates, Ur Screen: NOT DETECTED
Benzodiazepine, Ur Scrn: NOT DETECTED
Cannabinoid 50 Ng, Ur ~~LOC~~: NOT DETECTED
Cocaine Metabolite,Ur ~~LOC~~: NOT DETECTED
MDMA (Ecstasy)Ur Screen: NOT DETECTED
Methadone Scn, Ur: NOT DETECTED
Opiate, Ur Screen: POSITIVE — AB
Phencyclidine (PCP) Ur S: NOT DETECTED
Tricyclic, Ur Screen: NOT DETECTED

## 2020-03-19 LAB — CBC WITH DIFFERENTIAL/PLATELET
Abs Immature Granulocytes: 0.01 10*3/uL (ref 0.00–0.07)
Basophils Absolute: 0.1 10*3/uL (ref 0.0–0.1)
Basophils Relative: 1 %
Eosinophils Absolute: 0.1 10*3/uL (ref 0.0–0.5)
Eosinophils Relative: 2 %
HCT: 27.3 % — ABNORMAL LOW (ref 36.0–46.0)
Hemoglobin: 8.7 g/dL — ABNORMAL LOW (ref 12.0–15.0)
Immature Granulocytes: 0 %
Lymphocytes Relative: 32 %
Lymphs Abs: 1.9 10*3/uL (ref 0.7–4.0)
MCH: 20.2 pg — ABNORMAL LOW (ref 26.0–34.0)
MCHC: 31.9 g/dL (ref 30.0–36.0)
MCV: 63.5 fL — ABNORMAL LOW (ref 80.0–100.0)
Monocytes Absolute: 0.9 10*3/uL (ref 0.1–1.0)
Monocytes Relative: 14 %
Neutro Abs: 3.1 10*3/uL (ref 1.7–7.7)
Neutrophils Relative %: 51 %
Platelets: 189 10*3/uL (ref 150–400)
RBC: 4.3 MIL/uL (ref 3.87–5.11)
RDW: 20.7 % — ABNORMAL HIGH (ref 11.5–15.5)
WBC: 6 10*3/uL (ref 4.0–10.5)
nRBC: 0 % (ref 0.0–0.2)

## 2020-03-19 LAB — ETHANOL: Alcohol, Ethyl (B): <10 mg/dL (ref <10)

## 2020-03-19 LAB — URINALYSIS, COMPLETE (UACMP) WITH MICROSCOPIC
Bacteria, UA: NONE SEEN
Bilirubin Urine: NEGATIVE
Glucose, UA: NEGATIVE mg/dL
Hgb urine dipstick: NEGATIVE
Ketones, ur: NEGATIVE mg/dL
Nitrite: POSITIVE — AB
Protein, ur: NEGATIVE mg/dL
Specific Gravity, Urine: 1.02 (ref 1.005–1.030)
pH: 5 (ref 5.0–8.0)

## 2020-03-19 LAB — BRAIN NATRIURETIC PEPTIDE: B Natriuretic Peptide: 14.5 pg/mL (ref 0.0–100.0)

## 2020-03-19 LAB — TROPONIN I (HIGH SENSITIVITY): Troponin I (High Sensitivity): 28 ng/L — ABNORMAL HIGH (ref <18)

## 2020-03-19 LAB — LACTIC ACID, PLASMA: Lactic Acid, Venous: 2.1 mmol/L (ref 0.5–1.9)

## 2020-03-19 LAB — ACETAMINOPHEN LEVEL: Acetaminophen (Tylenol), Serum: <10 ug/mL — ABNORMAL LOW (ref 10–30)

## 2020-03-19 LAB — PREGNANCY, URINE: Preg Test, Ur: NEGATIVE

## 2020-03-19 LAB — SARS CORONAVIRUS 2 BY RT PCR (HOSPITAL ORDER, PERFORMED IN ~~LOC~~ HOSPITAL LAB): SARS Coronavirus 2: NEGATIVE

## 2020-03-19 MED ORDER — SODIUM CHLORIDE 0.9 % IV BOLUS
600.0000 mL | Freq: Once | INTRAVENOUS | Status: AC
  Administered 2020-03-19: 600 mL via INTRAVENOUS

## 2020-03-19 MED ORDER — DIVALPROEX SODIUM 250 MG PO DR TAB
250.0000 mg | DELAYED_RELEASE_TABLET | Freq: Every day | ORAL | Status: DC
  Administered 2020-03-19 – 2020-03-28 (×10): 250 mg via ORAL
  Filled 2020-03-19 (×10): qty 1

## 2020-03-19 MED ORDER — ASPIRIN EC 81 MG PO TBEC
81.0000 mg | DELAYED_RELEASE_TABLET | Freq: Every day | ORAL | Status: DC
  Administered 2020-03-19 – 2020-03-28 (×10): 81 mg via ORAL
  Filled 2020-03-19 (×10): qty 1

## 2020-03-19 MED ORDER — ACETAMINOPHEN 650 MG RE SUPP: 650.0000 mg | Freq: Four times a day (QID) | RECTAL | Status: DC | PRN

## 2020-03-19 MED ORDER — ALBUTEROL SULFATE (2.5 MG/3ML) 0.083% IN NEBU: 2.5000 mg | INHALATION_SOLUTION | RESPIRATORY_TRACT | Status: DC | PRN

## 2020-03-19 MED ORDER — LORAZEPAM 2 MG/ML IJ SOLN
1.0000 mg | INTRAMUSCULAR | Status: DC | PRN
  Filled 2020-03-19: qty 1

## 2020-03-19 MED ORDER — HYDRALAZINE HCL 20 MG/ML IJ SOLN
5.0000 mg | INTRAMUSCULAR | Status: DC | PRN
  Filled 2020-03-19: qty 0.25

## 2020-03-19 MED ORDER — SODIUM CHLORIDE 0.9% FLUSH
10.0000 mL | INTRAVENOUS | Status: DC | PRN
  Administered 2020-03-22: 10 mL

## 2020-03-19 MED ORDER — SODIUM CHLORIDE 0.9% FLUSH
10.0000 mL | Freq: Two times a day (BID) | INTRAVENOUS | Status: DC
  Administered 2020-03-25 – 2020-03-27 (×4): 10 mL

## 2020-03-19 MED ORDER — DARIFENACIN HYDROBROMIDE ER 7.5 MG PO TB24
7.5000 mg | ORAL_TABLET | Freq: Every day | ORAL | Status: DC
  Administered 2020-03-19 – 2020-03-28 (×10): 7.5 mg via ORAL
  Filled 2020-03-19 (×10): qty 1

## 2020-03-19 MED ORDER — ALPRAZOLAM 0.25 MG PO TABS
0.2500 mg | ORAL_TABLET | Freq: Three times a day (TID) | ORAL | Status: DC | PRN
  Administered 2020-03-20 – 2020-03-27 (×10): 0.25 mg via ORAL
  Filled 2020-03-19 (×10): qty 1

## 2020-03-19 MED ORDER — ENOXAPARIN SODIUM 40 MG/0.4ML ~~LOC~~ SOLN
40.0000 mg | Freq: Two times a day (BID) | SUBCUTANEOUS | Status: DC
  Administered 2020-03-19 – 2020-03-28 (×19): 40 mg via SUBCUTANEOUS
  Filled 2020-03-19 (×19): qty 0.4

## 2020-03-19 MED ORDER — CLOPIDOGREL BISULFATE 75 MG PO TABS
75.0000 mg | ORAL_TABLET | Freq: Every day | ORAL | Status: DC
  Administered 2020-03-19 – 2020-03-28 (×10): 75 mg via ORAL
  Filled 2020-03-19 (×10): qty 1

## 2020-03-19 MED ORDER — LORAZEPAM 2 MG/ML IJ SOLN: 1.0000 mg | INTRAMUSCULAR | Status: DC | PRN

## 2020-03-19 MED ORDER — ENOXAPARIN SODIUM 40 MG/0.4ML ~~LOC~~ SOLN: 40.0000 mg | SUBCUTANEOUS | Status: DC

## 2020-03-19 MED ORDER — DIVALPROEX SODIUM 500 MG PO DR TAB
500.0000 mg | DELAYED_RELEASE_TABLET | Freq: Every day | ORAL | Status: DC
  Administered 2020-03-19 – 2020-03-27 (×9): 500 mg via ORAL
  Filled 2020-03-19 (×10): qty 1

## 2020-03-19 MED ORDER — LACTATED RINGERS IV BOLUS (SEPSIS): 1000.0000 mL | Freq: Once | INTRAVENOUS | Status: DC

## 2020-03-19 MED ORDER — METOPROLOL TARTRATE 25 MG PO TABS
25.0000 mg | ORAL_TABLET | Freq: Two times a day (BID) | ORAL | Status: DC
  Administered 2020-03-19 – 2020-03-28 (×19): 25 mg via ORAL
  Filled 2020-03-19 (×18): qty 1

## 2020-03-19 MED ORDER — INSULIN ASPART 100 UNIT/ML ~~LOC~~ SOLN
0.0000 [IU] | SUBCUTANEOUS | Status: DC
  Administered 2020-03-19 (×2): 1 [IU] via SUBCUTANEOUS
  Administered 2020-03-19 – 2020-03-20 (×3): 2 [IU] via SUBCUTANEOUS
  Administered 2020-03-20: 1 [IU] via SUBCUTANEOUS
  Administered 2020-03-20: 2 [IU] via SUBCUTANEOUS
  Administered 2020-03-20: 1 [IU] via SUBCUTANEOUS
  Administered 2020-03-20: 3 [IU] via SUBCUTANEOUS
  Administered 2020-03-20 – 2020-03-21 (×2): 2 [IU] via SUBCUTANEOUS
  Administered 2020-03-21: 1 [IU] via SUBCUTANEOUS
  Administered 2020-03-21: 2 [IU] via SUBCUTANEOUS
  Administered 2020-03-21 – 2020-03-22 (×3): 3 [IU] via SUBCUTANEOUS
  Administered 2020-03-22 (×2): 7 [IU] via SUBCUTANEOUS
  Administered 2020-03-22 (×2): 2 [IU] via SUBCUTANEOUS
  Administered 2020-03-22: 5 [IU] via SUBCUTANEOUS
  Administered 2020-03-22: 2 [IU] via SUBCUTANEOUS
  Administered 2020-03-23: 5 [IU] via SUBCUTANEOUS
  Administered 2020-03-23 (×2): 2 [IU] via SUBCUTANEOUS
  Administered 2020-03-23: 3 [IU] via SUBCUTANEOUS
  Administered 2020-03-23: 5 [IU] via SUBCUTANEOUS
  Administered 2020-03-24: 3 [IU] via SUBCUTANEOUS
  Administered 2020-03-24: 5 [IU] via SUBCUTANEOUS
  Administered 2020-03-24 (×2): 3 [IU] via SUBCUTANEOUS
  Administered 2020-03-24: 5 [IU] via SUBCUTANEOUS
  Administered 2020-03-24: 2 [IU] via SUBCUTANEOUS
  Administered 2020-03-25 (×4): 3 [IU] via SUBCUTANEOUS
  Administered 2020-03-25: 5 [IU] via SUBCUTANEOUS
  Administered 2020-03-26: 7 [IU] via SUBCUTANEOUS
  Administered 2020-03-26: 2 [IU] via SUBCUTANEOUS
  Administered 2020-03-26: 3 [IU] via SUBCUTANEOUS
  Administered 2020-03-26: 2 [IU] via SUBCUTANEOUS
  Administered 2020-03-26: 1 [IU] via SUBCUTANEOUS
  Administered 2020-03-27: 3 [IU] via SUBCUTANEOUS
  Administered 2020-03-27 (×2): 1 [IU] via SUBCUTANEOUS
  Administered 2020-03-27: 2 [IU] via SUBCUTANEOUS
  Administered 2020-03-27: 5 [IU] via SUBCUTANEOUS
  Administered 2020-03-28: 3 [IU] via SUBCUTANEOUS
  Administered 2020-03-28 (×3): 2 [IU] via SUBCUTANEOUS
  Filled 2020-03-19 (×53): qty 1

## 2020-03-19 MED ORDER — SODIUM CHLORIDE 0.9 % IV SOLN
1.0000 g | Freq: Three times a day (TID) | INTRAVENOUS | Status: DC
  Filled 2020-03-19 (×4): qty 1

## 2020-03-19 MED ORDER — SODIUM CHLORIDE 0.9 % IV SOLN
1.0000 g | INTRAVENOUS | Status: DC
  Administered 2020-03-19: 1 g via INTRAVENOUS
  Filled 2020-03-19: qty 10

## 2020-03-19 MED ORDER — SACCHAROMYCES BOULARDII 250 MG PO CAPS
250.0000 mg | ORAL_CAPSULE | Freq: Two times a day (BID) | ORAL | Status: DC
  Administered 2020-03-19 – 2020-03-28 (×19): 250 mg via ORAL
  Filled 2020-03-19 (×21): qty 1

## 2020-03-19 MED ORDER — INSULIN DETEMIR 100 UNIT/ML ~~LOC~~ SOLN
25.0000 [IU] | Freq: Every day | SUBCUTANEOUS | Status: DC
  Administered 2020-03-19 – 2020-03-24 (×6): 25 [IU] via SUBCUTANEOUS
  Filled 2020-03-19 (×7): qty 0.25

## 2020-03-19 MED ORDER — CHLORHEXIDINE GLUCONATE CLOTH 2 % EX PADS
6.0000 | MEDICATED_PAD | Freq: Every day | CUTANEOUS | Status: DC
  Administered 2020-03-25 – 2020-03-27 (×3): 6 via TOPICAL

## 2020-03-19 MED ORDER — ACETAMINOPHEN 325 MG PO TABS
650.0000 mg | ORAL_TABLET | Freq: Four times a day (QID) | ORAL | Status: DC | PRN
  Administered 2020-03-26 – 2020-03-27 (×2): 650 mg via ORAL
  Filled 2020-03-19 (×2): qty 2

## 2020-03-19 MED ORDER — LACTATED RINGERS IV BOLUS (SEPSIS): 600.0000 mL | Freq: Once | INTRAVENOUS | Status: DC

## 2020-03-19 MED ORDER — SODIUM CHLORIDE 0.9 % IV BOLUS
1000.0000 mL | Freq: Once | INTRAVENOUS | Status: AC
  Administered 2020-03-19: 1000 mL via INTRAVENOUS

## 2020-03-19 MED ORDER — ATORVASTATIN CALCIUM 20 MG PO TABS
40.0000 mg | ORAL_TABLET | Freq: Every day | ORAL | Status: DC
  Administered 2020-03-19 – 2020-03-27 (×9): 40 mg via ORAL
  Filled 2020-03-19 (×9): qty 2

## 2020-03-19 MED ORDER — SODIUM CHLORIDE 0.9 % IV SOLN: INTRAVENOUS | Status: DC

## 2020-03-19 NOTE — ED Notes (Signed)
This RN and Beth EDT unable to obtain blood cultures due to patient being a difficult stick. Lab notified.

## 2020-03-19 NOTE — Progress Notes (Signed)
Spoke with Kaleen Odea, RN c/o PICC order. Verbal consent already obtained. Plan for PICC to be placed this afternoon.

## 2020-03-19 NOTE — Progress Notes (Signed)
Assessed RUE for PIV, midline and PICC line.  Attempted 2.5 inch PIV via ultrasound in RAFA x2 unsuccessfully.  All vessels too small for access.  At this point recommend CVC placement for labs and meds. Pt does no tolerate procedure well, jerking arms and moving during attempts.  RN notified, she is notifying MD.  LA restricted due to fracture.

## 2020-03-19 NOTE — H&P (Addendum)
History and Physical    [name] ZOX:[id number] DOB: 06/15/1966 DOA: 03/19/2020  Referring MD/NP/PA:   [facility]   Patient coming from:  The patient is coming from home.  At baseline, pt is independent for most of ADL.        Chief Complaint: AMS, fall  HPI: [name] is a 54 y.o. female with medical history significant of hypertension, hyperlipidemia, diabetes mellitus, stroke, [name], depression, anxiety, seizure, who presents with AMS and fall.   Per report, pt recently had fall and was seen in ED 6/29, and found to have left distal radius fracture. Wrist splint was placed. Patient was discharged home with prescriptions for a low-dose of tylenol with codeine. Patient is to follow up with orthopedics. Per EDP, pt was noted to be confused and has generalized weakness. Patient took off wrist splint. She fell twice due to her legs buckling when her husband was trying to get her here to the hospital. He was able to catch her and she did not strike her head. Her husband did not think patient accidentally took too many pain medicines. When I saw pt in ED, she is very drowsy, but arousable.  Patient is still orientated x3. She has pain in left wrist.  She denies chest pain, cough, shortness of breath.  No fever or chills.  No nausea vomiting, diarrhea or abdominal pain.  Denies symptoms of UTI.  She moves all extremities.  No facial droop or slurred speech.  ED Course: pt was found to have WBC 6.0, lactic acid 2.1, troponin 28, positive UDS for opiates, pending pregnancy test, Tylenol level less than 10, salicylate level less than 7, negative COVID-19 PCR, alcohol level less than 10, urinalysis (hazy appearance, small amount of leukocyte, positive nitrite, no bacteria, WBC 21-50), AKI with creatinine 1.36, BUN 27 (creatinine 0.84 on 03/03/2020), temperature normal, blood pressure 135/80, tachycardia, tachypnea, oxygen saturation 86% on room air which improved with 99% on 2 L  nasal cannula oxygen.  CT head is negative for acute intracranial abnormalities, but showed old right MCA territory infarct.  Patient is admitted to MedSurg bed as inpatient.   Review of Systems:   General: no fevers, chills, no body weight gain, has fatigue HEENT: no blurry vision, hearing changes or sore throat Respiratory: no dyspnea, coughing, wheezing CV: no chest pain, no palpitations GI: no nausea, vomiting, abdominal pain, diarrhea, constipation GU: no dysuria, burning on urination, increased urinary frequency, hematuria  Ext: no leg edema Neuro: no unilateral weakness, numbness, or tingling, no vision change or hearing loss. Has fall and AMS. Skin: no rash, no skin tear. MSK: has pain in left wrist Heme: No easy bruising.  Travel history: No recent long distant travel.  Allergy: No Known Allergies  Past Medical History:  Diagnosis Date  . Allergic rhinitis   . Chickenpox   . Depression   . Diabetes (HCC)   . Epilepsy (HCC)   . Headache   . HTN (hypertension)   . Hyperlipidemia   . Seizures (HCC)   . Stroke (HCC)   . Urinary incontinence     Past Surgical History:  Procedure Laterality Date  . CESAREAN SECTION    . INCISION AND DRAINAGE Right 05/13/2016   Procedure: INCISION AND DRAINAGE;  Surgeon: Gwyneth RevelsJustin Fowler, DPM;  Location: [facility] ORS;  Service: Podiatry;  Laterality: Right;    Social History:  reports that she has never smoked. She has never used smokeless tobacco. She reports that she does  not drink alcohol and does not use drugs.  Family History:  Family History  Problem Relation Age of Onset  . Hypertension Mother   . Diabetes Mellitus II Mother   . Hypertension Father   . Pancreatic cancer Father      Prior to Admission medications   Medication Sig Start Date End Date Taking? Authorizing Provider  acetaminophen-codeine (TYLENOL #3) 300-30 MG tablet Take 1 tablet by mouth every 4 (four) hours as needed. 03/16/20  Yes [provider]   aspirin EC 81 MG EC tablet Take 1 tablet (81 mg total) by mouth daily. Swallow whole. 03/04/20  Yes Glade Lloyd, MD  atorvastatin (LIPITOR) 40 MG tablet Take 1 tablet (40 mg total) by mouth daily at 6 PM. 08/16/16  Yes Ghimire, Werner Lean, MD  clopidogrel (PLAVIX) 75 MG tablet Take 75 mg by mouth daily. 12/22/19  Yes [provider]  divalproex (DEPAKOTE) 250 MG DR tablet Take 250-500 mg by mouth 2 (two) times daily. Take one tablet (250 mg) by mouth every morning and two tablets (500 mg) at bedtime 07/13/19  Yes [provider]  insulin aspart (NOVOLOG FLEXPEN) 100 UNIT/ML FlexPen Inject 10 Units into the skin 3 (three) times daily with meals. Patient taking differently: Inject 15 Units into the skin 3 (three) times daily with meals.  08/16/16  Yes Ghimire, Werner Lean, MD  insulin detemir (LEVEMIR FLEXPEN) 100 UNIT/ML FlexPen Inject 35 Units into the skin daily at 10 pm. 03/03/20  Yes Glade Lloyd, MD  lisinopril (ZESTRIL) 5 MG tablet Take 5 mg by mouth daily.   Yes [provider]  metFORMIN (GLUCOPHAGE-XR) 500 MG 24 hr tablet Take 500 mg by mouth 2 (two) times daily. 08/23/19  Yes [provider]  metoprolol tartrate (LOPRESSOR) 25 MG tablet Take 25 mg by mouth 2 (two) times a day.   Yes [provider]  saccharomyces boulardii (FLORASTOR) 250 MG capsule Take 250 mg by mouth 2 (two) times daily.   Yes [provider]  VESICARE 10 MG tablet Take 10 mg by mouth 2 (two) times daily. 05/26/19  Yes [provider]    Physical Exam: Vitals:   03/19/20 0617 03/19/20 0623 03/19/20 0700 03/19/20 0730  BP: 135/80  110/60 130/77  Pulse: 89 94 88 93  Resp: 15 20 15 17   Temp:      TempSrc:      SpO2: 100% 99% 98% 100%  Weight:      Height:       General: Not in acute distress HEENT:       Eyes: PERRL, EOMI, no scleral icterus.       ENT: No discharge from the ears and nose, no pharynx injection, no tonsillar enlargement.        Neck: No  JVD, no bruit, no mass felt. Heme: No neck lymph node enlargement. Cardiac: S1/S2, RRR, No murmurs, No gallops or rubs. Respiratory: No rales, wheezing, rhonchi or rubs. GI: Soft, nondistended, nontender, no organomegaly, BS present. GU: No hematuria Ext: No pitting leg edema bilaterally. 1+DP/PT pulse bilaterally. Musculoskeletal: No joint deformities, No joint redness or warmth, no limitation of ROM in spin. Has tenderness in left wrist. Skin: No rashes.  Neuro: pt is drowsy, but arousable, oriented X3, cranial nerves II-XII grossly intact, moves all extremities normally.  Psych: Patient is not psychotic, no suicidal or hemocidal ideation.  Labs on Admission: I have personally reviewed following labs and imaging studies  CBC: Recent Labs  Lab 03/19/20 0458  WBC 6.0  NEUTROABS 3.1  HGB 8.7*  HCT 27.3*  MCV 63.5*  PLT 189   Basic Metabolic Panel: Recent Labs  Lab 03/19/20 0458  NA 141  K 4.0  CL 105  CO2 26  GLUCOSE 151*  BUN 27*  CREATININE 1.36*  CALCIUM 8.8*   GFR: Estimated Creatinine Clearance: 55.3 mL/min (A) (by C-G formula based on SCr of 1.36 mg/dL (H)). Liver Function Tests: Recent Labs  Lab 03/19/20 0458  AST 20  ALT 13  ALKPHOS 31*  BILITOT 0.9  PROT 7.1  ALBUMIN 3.5   No results for input(s): LIPASE, AMYLASE in the last 168 hours. No results for input(s): AMMONIA in the last 168 hours. Coagulation Profile: No results for input(s): INR, PROTIME in the last 168 hours. Cardiac Enzymes: No results for input(s): CKTOTAL, CKMB, CKMBINDEX, TROPONINI in the last 168 hours. BNP (last 3 results) No results for input(s): PROBNP in the last 8760 hours. HbA1C: No results for input(s): HGBA1C in the last 72 hours. CBG: No results for input(s): GLUCAP in the last 168 hours. Lipid Profile: No results for input(s): CHOL, HDL, LDLCALC, TRIG, CHOLHDL, LDLDIRECT in the last 72 hours. Thyroid Function Tests: No results for input(s): TSH, T4TOTAL, FREET4,  T3FREE, THYROIDAB in the last 72 hours. Anemia Panel: No results for input(s): VITAMINB12, FOLATE, FERRITIN, TIBC, IRON, RETICCTPCT in the last 72 hours. Urine analysis:    Component Value Date/Time   COLORURINE YELLOW (A) 03/19/2020 0507   APPEARANCEUR HAZY (A) 03/19/2020 0507   APPEARANCEUR Clear 12/27/2014 1806   LABSPEC 1.020 03/19/2020 0507   LABSPEC 1.035 12/27/2014 1806   PHURINE 5.0 03/19/2020 0507   GLUCOSEU NEGATIVE 03/19/2020 0507   GLUCOSEU >=500 12/27/2014 1806   HGBUR NEGATIVE 03/19/2020 0507   BILIRUBINUR NEGATIVE 03/19/2020 0507   BILIRUBINUR Negative 12/27/2014 1806   KETONESUR NEGATIVE 03/19/2020 0507   PROTEINUR NEGATIVE 03/19/2020 0507   NITRITE POSITIVE (A) 03/19/2020 0507   LEUKOCYTESUR SMALL (A) 03/19/2020 0507   LEUKOCYTESUR Negative 12/27/2014 1806   Sepsis Labs: @LABRCNTIP (procalcitonin:4,lacticidven:4) ) Recent Results (from the past 240 hour(s))  SARS Coronavirus 2 by RT PCR (hospital order, performed in [facility] hospital lab) Nasopharyngeal Nasopharyngeal Swab     Status: None   Collection Time: 03/19/20  5:20 AM   Specimen: Nasopharyngeal Swab  Result Value Ref Range Status   SARS Coronavirus 2 NEGATIVE NEGATIVE Final    Comment: (NOTE) SARS-CoV-2 target nucleic acids are NOT DETECTED.  The SARS-CoV-2 RNA is generally detectable in upper and lower respiratory specimens during the acute phase of infection. The lowest concentration of SARS-CoV-2 viral copies this assay can detect is 250 copies / mL. A negative result does not preclude SARS-CoV-2 infection and should not be used as the sole basis for treatment or other patient management decisions.  A negative result may occur with improper specimen collection / handling, submission of specimen other than nasopharyngeal swab, presence of viral mutation(s) within the areas targeted by this assay, and inadequate number of viral copies (<250 copies / mL). A negative result must be combined with  clinical observations, patient history, and epidemiological information.  Fact Sheet for Patients:   05/20/20  Fact Sheet for Healthcare Providers: BoilerBrush.com.cy  This test is not yet approved or  cleared by the https://pope.com/ FDA and has been authorized for detection and/or diagnosis of SARS-CoV-2 by FDA under an Emergency Use Authorization (EUA).  This EUA will remain in effect (meaning this test can be used) for the duration of  the COVID-19 declaration under Section 564(b)(1) of the Act, 21 U.S.C. section 360bbb-3(b)(1), unless the authorization is terminated or revoked sooner.  Performed at [facility], [address], [place], [place] [address]      Radiological Exams on Admission: CT Head Wo Contrast  Result Date: 03/19/2020 CLINICAL DATA:  54 year old female with history of confusion. Fall with fracture of left arm. EXAM: CT HEAD WITHOUT CONTRAST TECHNIQUE: Contiguous axial images were obtained from the base of the skull through the vertex without intravenous contrast. COMPARISON:  Head CT 02/24/2020. FINDINGS: Brain: Areas of low attenuation throughout the right frontal, parietal and temporal regions compatible with encephalomalacia from remote right MCA territory infarction, similar to the prior examination. Well-defined focus of low attenuation in the medial aspect of the left basal ganglia, similar to the prior study, compatible with an old lacunar infarct. Patchy and confluent areas of decreased attenuation are noted throughout the deep and periventricular white matter of the cerebral hemispheres bilaterally, compatible with chronic microvascular ischemic disease. No evidence of acute infarction, hemorrhage, hydrocephalus, extra-axial collection or mass lesion/mass effect. Vascular: No hyperdense vessel or unexpected calcification. Skull: Normal. Negative for fracture or focal lesion. Sinuses/Orbits: No acute  finding. Other: None. IMPRESSION: 1. No acute intracranial abnormalities. 2. Old right MCA territory infarct, left basal ganglia lacunar infarct and chronic microvascular ischemic changes in the cerebral white matter, similar to prior studies, as above. Electronically Signed   By: Trudie Reed M.D.   On: 03/19/2020 07:13   DG Chest Port 1 View  Result Date: 03/19/2020 CLINICAL DATA:  54 year old female with history of weakness. EXAM: PORTABLE CHEST 1 VIEW COMPARISON:  Chest x-ray 02/25/2020. FINDINGS: Lung volumes are normal. No consolidative airspace disease. No pleural effusions. No pneumothorax. No pulmonary nodule or mass noted. No evidence of pulmonary edema. Heart size is mildly enlarged. Upper mediastinal contours are within normal limits. IMPRESSION: 1. No radiographic evidence of acute cardiopulmonary disease. 2. Mild cardiomegaly. Electronically Signed   By: Trudie Reed M.D.   On: 03/19/2020 06:37     EKG: Independently reviewed.  Sinus rhythm, QTC 597, poor R wave progression, T wave flattening  Assessment/Plan Principal Problem:   UTI (urinary tract infection) Active Problems:   HTN (hypertension)   Diabetes mellitus without complication (HCC)   Hyperlipidemia   Acute metabolic encephalopathy   Seizure (HCC)   Elevated troponin   AKI (acute kidney injury) (HCC)   Fall   Iron deficiency anemia   Stroke (HCC)   Acute respiratory failure with hypoxia (HCC)   Distal radial fracture_left   UTI (urinary tract infection): Patient does not have leukocytosis or fever.  Patient had mildly elevated lactic acid 2.1, clinically does not seem to be septic. -will admit to MedSurg bed as inpatient -IV Rocephin in ED -->will change to meropenem since patient had positive culture for resistant Raoultella [place] on 07/23/19 -Follow-up blood culture and urine culture -Trend lactic acid -IV fluid: 1.6 L of normal saline was given in ED, will continue 75 cc/h  Addendum: since pt  denies symptoms of UTI, urinalysis with no bacteria, pharmacist, Dr. Cher Nakai recommend to hold off antibiotics and follow-up culture.  HTN (hypertension): -Hydralazine as needed -Hold lisinopril due to AKI -Continue metoprolol  Diabetes mellitus without complication (HCC): Most recent A1c 7.7, poorly controled. Patient is taking Metformin, NovoLog, Levemir at home -will decrease Levemir dose from 35 to 25 units daily -SSI  Hyperlipidemia -lipitor  Acute metabolic encephalopathy: Likely due to UTI.  CT head  is negative for acute intracranial issues. -Frequent neuro check  Seizure -Seizure precaution -When necessary Ativan for seizure -Continue Home medications: Depakote  Elevated troponin: trop 28, no CP.  Likely due to demand ischemia -Patient is on aspirin, Plavix, Lipitor, metoprolol -Trend troponin -Check A1c, FLP  AKI (acute kidney injury) (HCC): -Hold lisinopril -IV fluid as above  Fall -PT/OT  Iron deficiency anemia: Hemoglobin 9.1 on 03/03/2020 --> 8.7.  Patient is not taking iron supplement. -Follow-up with PCP  Stroke (HCC) -Continue aspirin, Plavix, Lipitor  Acute respiratory failure with hypoxia [facility]): Etiology is not clear.  Likely due to altered mental status.  Patient denies any chest pain, shortness breath.  No history for CHF or COPD.  Chest x-ray negative. -Continue nasal cannula oxygen to maintain oxygen saturation above 93% -As needed albuterol nebulizers  Distal radial fracture_left: -As needed Tylenol for pain -Follow-up with Ortho as outpatient  Addendum:  It is very difficult to put IV by IV team, then PICC line is placed, but pt accidentally pulled out PICC line.  IV team could not put another PICC line due to the difficulties.  I consulted Dr. Jayme Cloud of critical care for central line placement, but the patient refused to get PICC line placement. Abx is on hold as recommended by pharmacist.  Patient is taking p.o. well.  Per Dr.  Jayme Cloud, "If the patient deteriorates and needs central line and agrees to the same she will need transfer to higher level of care as placement of central access on the general ward is not safe.  Currently I would recommend just encouraging p.o. fluids.  If the patient requires as the SDU/ICU level care will be happy to formally consult".       DVT ppx: SQ Lovenox Code Status: Full code Family Communication: called her husband by phone Disposition Plan:  Anticipate discharge back to previous environment Consults called:  none Admission status: Med-surg bed as inpt     Status is: Inpatient  Remains inpatient appropriate because:Inpatient level of care appropriate due to severity of illness.  Patient has multiple comorbidities, now presents with multiple acute issues, including altered mental status, UTI, AKI, elevated troponin, fall and oxygen desaturation.  Her presentation is highly complicated.  Patient is at high risk of deteriorating.  Will need to be treated in hospital for at least 2 days.   Dispo: The patient is from: Home              Anticipated d/c is to: Home              Anticipated d/c date is: 2 days              Patient currently is not medically stable to d/c.          Date of Service 03/19/2020    Lorretta Harp [facility]   If 7PM-7AM, please contact night-coverage [email or web address] 03/19/2020, 8:05 AM

## 2020-03-19 NOTE — Progress Notes (Signed)
Spoke with bedside nurse regarding 2nd LA. She states that phlebotomy has been unable to obtain the labs. They are requesting to see if a PICC would be possible.

## 2020-03-19 NOTE — ED Notes (Signed)
Date and time results received: 03/19/20 05:47 (use smartphrase ".now" to insert current time)  Test: lactic Critical Value: 2.1  Name of Provider Notified: Dr. Dolores Frame  Orders Received? Or Actions Taken?: acknowledged

## 2020-03-19 NOTE — Progress Notes (Signed)
Patient is a difficult stick and plebotomy is having trouble obtaining labs.

## 2020-03-19 NOTE — ED Notes (Signed)
Patient transported to CT 

## 2020-03-19 NOTE — Progress Notes (Addendum)
Spoke with Colin Mulders RN re PICC order.  VAS Team left room at 1515, note that PICC was out at 1531.  Not recommended to replace PICC.  Only one vessel suitable during previous PICC assessment, which was used for PICC placement.  Due to pending blood culture samples, pt confusion/ pulling out lines and medications ordered, this RN recommends a PIV placement and reconsider PICC once the blood cultures results are negative x 48 hours, if pt less confused.  RN to notify MD of recommendations.

## 2020-03-19 NOTE — Plan of Care (Signed)
°  Problem: Fluid Volume: °Goal: Hemodynamic stability will improve °Outcome: Progressing °  °Problem: Clinical Measurements: °Goal: Diagnostic test results will improve °Outcome: Progressing °Goal: Signs and symptoms of infection will decrease °Outcome: Progressing °  °

## 2020-03-19 NOTE — Progress Notes (Signed)
PHARMACIST - PHYSICIAN COMMUNICATION  CONCERNING:  Enoxaparin (Lovenox) for DVT Prophylaxis    RECOMMENDATION: Patient was prescribed enoxaprin 40mg  q24 hours for VTE prophylaxis.   Filed Weights   03/19/20 0435  Weight: 110 kg (242 lb 8.1 oz)    Body mass index is 44.35 kg/m.  Estimated Creatinine Clearance: 55.3 mL/min (A) (by C-G formula based on SCr of 1.36 mg/dL (H)).   Based on [facility] policy patient is candidate for enoxaparin 40mg  every 12 hour dosing due to BMI being >40.   DESCRIPTION: Pharmacy has adjusted enoxaparin dose per [facility] policy.  Patient is now receiving enoxaparin 40mg  every 12 hours.    , RPh Clinical Pharmacist  03/19/2020 10:28 AM

## 2020-03-19 NOTE — Progress Notes (Signed)
Per IV team RN, there is no accessible veins for IV, midline or PICC line. RN notified attending. Waiting Attending response.

## 2020-03-19 NOTE — Progress Notes (Signed)
Pt laying in bed when RN came in. Pt pulled PICC line out of arm. RN held pressure and covered insertion site. Pt confused. RN notified attending and waiting on response.

## 2020-03-19 NOTE — ED Provider Notes (Signed)
[facility] Emergency Department Provider Note   ____________________________________________   First MD Initiated Contact with Patient 03/19/20 0507     (approximate)  I have reviewed the triage vital signs and the nursing notes.   HISTORY  Chief Complaint Fall  Level V caveat: Limited by altered mentation History given by spouse who is a poor historian  HPI [name] is a 54 y.o. female brought to the ED from home by her husband with a chief complaint of fall and weakness.  Patient was seen in the ED for left distal radius fracture stemming from a fall and had a cast placed 2 days ago.  Has been is unsure why but tonight patient took off the cast.  States she fell twice due to her legs buckling while he was trying to get her here to the hospital.  He was able to catch her and she did not strike her head.  Patient arrives to the ED with altered mentation.  Husband is unable to tell me how long she has been altered.  From what he describes, seems like this has been progressing throughout the evening and not acutely.  Does not think patient accidentally took too many pain medicines.  Rest of history unobtainable secondary to patient's altered mentation.      Past Medical History:  Diagnosis Date  . Allergic rhinitis   . Chickenpox   . Depression   . Diabetes (HCC)   . Epilepsy (HCC)   . Headache   . HTN (hypertension)   . Hyperlipidemia   . Seizures (HCC)   . Stroke (HCC)   . Urinary incontinence     Patient Active Problem List   Diagnosis Date Noted  . UTI (urinary tract infection) 03/19/2020  . Pneumonia of left lower lobe due to infectious organism   . Iron deficiency anemia   . Shortness of breath   . Lobar pneumonia (HCC)   . Weakness 12/20/2019  . Hemiparesis affecting left side as late effect of cerebrovascular accident (CVA) (HCC) 12/20/2019  . Type 2 diabetes mellitus with hyperlipidemia (HCC) 12/20/2019  . Seizure disorder as  sequela of cerebrovascular accident (HCC) 12/20/2019  . Urinary incontinence 12/20/2019  . Self-care deficit 12/20/2019  . Fall   . Lacunar stroke, acute (HCC) 07/24/2019  . Seizure (HCC) 07/24/2019  . Elevated troponin 07/24/2019  . AKI (acute kidney injury) (HCC) 07/24/2019  . Acute metabolic encephalopathy 07/23/2019  . Cerebral infarction (HCC) 08/14/2016  . Recurrent cerebrovascular accidents (CVAs) (HCC) 08/14/2016  . Delirium due to another medical condition 08/13/2016  . Urinary tract infection 08/13/2016  . Right foot infection 05/11/2016  . Foot abscess, right 05/11/2016  . Anxiety and depression 12/02/2015  . CVA (cerebral infarction) 11/27/2015  . Epilepsy (HCC) 06/17/2015  . 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 Headache 05/19/2015  . Weakness of left side of body 05/19/2015  . History of cardioembolic cerebrovascular accident (CVA) 05/19/2015  . Morbid obesity with BMI of 40.0-44.9, adult (HCC) 03/19/2014  . HTN (hypertension)   . Diabetes mellitus without complication (HCC)   . Hyperlipidemia   . [name] (transient ischemic attack) 03/17/2014    Past Surgical History:  Procedure Laterality Date  . CESAREAN SECTION    . INCISION AND DRAINAGE Right 05/13/2016   Procedure: INCISION AND DRAINAGE;  Surgeon: Gwyneth RevelsJustin Fowler, DPM;  Location: [facility] ORS;  Service: Podiatry;  Laterality: Right;    Prior to Admission medications   Medication Sig Start Date End Date Taking? Authorizing Provider  acetaminophen-codeine (TYLENOL #3) 300-30 MG tablet Take 1 tablet by mouth every 4 (four) hours as needed. 03/16/20  Yes [provider]  aspirin EC 81 MG EC tablet Take 1 tablet (81 mg total) by mouth daily. Swallow whole. 03/04/20  Yes Glade Lloyd, MD  atorvastatin (LIPITOR) 40 MG tablet Take 1 tablet (40 mg total) by mouth daily at 6 PM. 08/16/16  Yes Ghimire, Werner Lean, MD  clopidogrel (PLAVIX) 75 MG tablet Take 75 mg by mouth daily. 12/22/19  Yes [provider]   divalproex (DEPAKOTE) 250 MG DR tablet Take 250-500 mg by mouth 2 (two) times daily. Take one tablet (250 mg) by mouth every morning and two tablets (500 mg) at bedtime 07/13/19  Yes [provider]  insulin aspart (NOVOLOG FLEXPEN) 100 UNIT/ML FlexPen Inject 10 Units into the skin 3 (three) times daily with meals. Patient taking differently: Inject 15 Units into the skin 3 (three) times daily with meals.  08/16/16  Yes Ghimire, Werner Lean, MD  insulin detemir (LEVEMIR FLEXPEN) 100 UNIT/ML FlexPen Inject 35 Units into the skin daily at 10 pm. 03/03/20  Yes Glade Lloyd, MD  lisinopril (ZESTRIL) 5 MG tablet Take 5 mg by mouth daily.   Yes [provider]  metFORMIN (GLUCOPHAGE-XR) 500 MG 24 hr tablet Take 500 mg by mouth 2 (two) times daily. 08/23/19  Yes [provider]  metoprolol tartrate (LOPRESSOR) 25 MG tablet Take 25 mg by mouth 2 (two) times a day.   Yes [provider]  saccharomyces boulardii (FLORASTOR) 250 MG capsule Take 250 mg by mouth 2 (two) times daily.   Yes [provider]  VESICARE 10 MG tablet Take 10 mg by mouth 2 (two) times daily. 05/26/19  Yes [provider]    Allergies Patient has no known allergies.  Family History  Problem Relation Age of Onset  . Hypertension Mother   . Diabetes Mellitus II Mother   . Hypertension Father   . Pancreatic cancer Father     Social History Social History   Tobacco Use  . Smoking status: Never Smoker  . Smokeless tobacco: Never Used  Substance Use Topics  . Alcohol use: No    Alcohol/week: 0.0 standard drinks  . Drug use: No    Review of Systems  Constitutional: Positive for generalized weakness.  No fever/chills Eyes: No visual changes. ENT: No sore throat. Cardiovascular: Denies chest pain. Respiratory: Denies shortness of breath. Gastrointestinal: No abdominal pain.  No nausea, no vomiting.  No diarrhea.  No constipation. Genitourinary: Negative for  dysuria. Musculoskeletal: Negative for back pain. Skin: Negative for rash. Neurological: Positive for altered mentation.  Negative for headaches, focal weakness or numbness.   ____________________________________________   PHYSICAL EXAM:  VITAL SIGNS: ED Triage Vitals  Enc Vitals Group     BP 03/19/20 0457 111/65     Pulse Rate 03/19/20 0457 (!) 101     Resp 03/19/20 0457 18     Temp 03/19/20 0457 98.4 F (36.9 C)     Temp Source 03/19/20 0457 Oral     SpO2 03/19/20 0457 (!) 86 %     Weight 03/19/20 0435 242 lb 8.1 oz (110 kg)     Height 03/19/20 0435 5\' 2"  (1.575 m)     Head Circumference --      Peak Flow --      Pain Score --      Pain Loc --      Pain Edu? --  Excl. in GC? --     Constitutional: Alert.  Confused appearing and in mild acute distress. Eyes: Conjunctivae are normal. PERRL. EOMI. Head: Atraumatic. Nose: No congestion/rhinnorhea. Mouth/Throat: Mucous membranes are dry. Neck: No stridor.  No cervical spine tenderness to palpation.   Cardiovascular: Normal rate, regular rhythm. Grossly normal heart sounds.  Good peripheral circulation. Respiratory: Normal respiratory effort.  No retractions. Lungs diminished bibasilarly. Gastrointestinal: Obese.  Soft and nontender to light or deep palpation. No distention. No abdominal bruits. No CVA tenderness. Musculoskeletal: Left wrist/forearm in Ace bandage.  No lower extremity tenderness nor edema.  No joint effusions. Neurologic: Eyes open spontaneously.  Unable to follow simple commands.  No gross focal neurologic deficits are appreciated.  Skin:  Skin is warm, dry and intact. No rash noted.  No petechiae. Psychiatric: Mood and affect are normal. Speech and behavior are normal. ____________________________________________   LABS (all labs ordered are listed, but only abnormal results are displayed)  Labs Reviewed  LACTIC ACID, PLASMA - Abnormal; Notable for the following components:      Result Value    Lactic Acid, Venous 2.1 (*)    All other components within normal limits  CBC WITH DIFFERENTIAL/PLATELET - Abnormal; Notable for the following components:   Hemoglobin 8.7 (*)    HCT 27.3 (*)    MCV 63.5 (*)    MCH 20.2 (*)    RDW 20.7 (*)    All other components within normal limits  COMPREHENSIVE METABOLIC PANEL - Abnormal; Notable for the following components:   Glucose, Bld 151 (*)    BUN 27 (*)    Creatinine, Ser 1.36 (*)    Calcium 8.8 (*)    Alkaline Phosphatase 31 (*)    GFR calc non Af Amer 44 (*)    GFR calc Af Amer 51 (*)    All other components within normal limits  SALICYLATE LEVEL - Abnormal; Notable for the following components:   Salicylate Lvl <7.0 (*)    All other components within normal limits  ACETAMINOPHEN LEVEL - Abnormal; Notable for the following components:   Acetaminophen (Tylenol), Serum <10 (*)    All other components within normal limits  URINALYSIS, COMPLETE (UACMP) WITH MICROSCOPIC - Abnormal; Notable for the following components:   Color, Urine YELLOW (*)    APPearance HAZY (*)    Nitrite POSITIVE (*)    Leukocytes,Ua SMALL (*)    All other components within normal limits  URINE DRUG SCREEN, QUALITATIVE ([facility] ONLY) - Abnormal; Notable for the following components:   Opiate, Ur Screen POSITIVE (*)    All other components within normal limits  TROPONIN I (HIGH SENSITIVITY) - Abnormal; Notable for the following components:   Troponin I (High Sensitivity) 28 (*)    All other components within normal limits  SARS CORONAVIRUS 2 BY RT PCR (HOSPITAL ORDER, PERFORMED IN [facility])  CULTURE, BLOOD (ROUTINE X 2)  CULTURE, BLOOD (ROUTINE X 2)  URINE CULTURE  ETHANOL  LACTIC ACID, PLASMA  BRAIN NATRIURETIC PEPTIDE  TROPONIN I (HIGH SENSITIVITY)   ____________________________________________  EKG  ED ECG REPORT I, Ciclaly Mulcahey J, the attending physician, personally viewed and interpreted this ECG.   Date: 03/19/2020  EKG Time: 0534   Rate: 89  Rhythm: normal EKG, normal sinus rhythm  Axis: LAD  Intervals: QTC 597  ST&T Change: Nonspecific  ____________________________________________  RADIOLOGY  ED MD interpretation: No ICH, no acute cardiopulmonary process  Official radiology report(s): CT Head Wo Contrast  Result Date: 03/19/2020  CLINICAL DATA:  54 year old female with history of confusion. Fall with fracture of left arm. EXAM: CT HEAD WITHOUT CONTRAST TECHNIQUE: Contiguous axial images were obtained from the base of the skull through the vertex without intravenous contrast. COMPARISON:  Head CT 02/24/2020. FINDINGS: Brain: Areas of low attenuation throughout the right frontal, parietal and temporal regions compatible with encephalomalacia from remote right MCA territory infarction, similar to the prior examination. Well-defined focus of low attenuation in the medial aspect of the left basal ganglia, similar to the prior study, compatible with an old lacunar infarct. Patchy and confluent areas of decreased attenuation are noted throughout the deep and periventricular white matter of the cerebral hemispheres bilaterally, compatible with chronic microvascular ischemic disease. No evidence of acute infarction, hemorrhage, hydrocephalus, extra-axial collection or mass lesion/mass effect. Vascular: No hyperdense vessel or unexpected calcification. Skull: Normal. Negative for fracture or focal lesion. Sinuses/Orbits: No acute finding. Other: None. IMPRESSION: 1. No acute intracranial abnormalities. 2. Old right MCA territory infarct, left basal ganglia lacunar infarct and chronic microvascular ischemic changes in the cerebral white matter, similar to prior studies, as above. Electronically Signed   By: Trudie Reed M.D.   On: 03/19/2020 07:13   DG Chest Port 1 View  Result Date: 03/19/2020 CLINICAL DATA:  54 year old female with history of weakness. EXAM: PORTABLE CHEST 1 VIEW COMPARISON:  Chest x-ray 02/25/2020. FINDINGS: Lung  volumes are normal. No consolidative airspace disease. No pleural effusions. No pneumothorax. No pulmonary nodule or mass noted. No evidence of pulmonary edema. Heart size is mildly enlarged. Upper mediastinal contours are within normal limits. IMPRESSION: 1. No radiographic evidence of acute cardiopulmonary disease. 2. Mild cardiomegaly. Electronically Signed   By: Trudie Reed M.D.   On: 03/19/2020 06:37    ____________________________________________   PROCEDURES  Procedure(s) performed (including Critical Care):  .1-3 Lead EKG Interpretation Performed by: Irean Hong, MD Authorized by: Irean Hong, MD     Interpretation: normal     ECG rate:  95   ECG rate assessment: normal     Rhythm: sinus rhythm     Ectopy: none     Conduction: normal   Comments:     Patient placed on cardiac monitor to evaluate for arrhythmias   CRITICAL CARE Performed by: Irean Hong   Total critical care time: 45 minutes  Critical care time was exclusive of separately billable procedures and treating other patients.  Critical care was necessary to treat or prevent imminent or life-threatening deterioration.  Critical care was time spent personally by me on the following activities: development of treatment plan with patient and/or surrogate as well as nursing, discussions with consultants, evaluation of patient's response to treatment, examination of patient, obtaining history from patient or surrogate, ordering and performing treatments and interventions, ordering and review of laboratory studies, ordering and review of radiographic studies, pulse oximetry and re-evaluation of patient's condition.   ____________________________________________   INITIAL IMPRESSION / ASSESSMENT AND PLAN / ED COURSE  As part of my medical decision making, I reviewed the following data within the electronic MEDICAL RECORD NUMBER History obtained from family, Nursing notes reviewed and incorporated, Labs reviewed,  EKG interpreted, Old chart reviewed, Radiograph reviewed, Discussed with admitting physician and Notes from prior ED visits     [name] was evaluated in Emergency Department on 03/19/2020 for the symptoms described in the history of present illness. She was evaluated in the context of the global COVID-19 pandemic, which necessitated consideration that the patient might be at risk  for infection with the SARS-CoV-2 virus that causes COVID-19. Institutional protocols and algorithms that pertain to the evaluation of patients at risk for COVID-19 are in a state of rapid change based on information released by regulatory bodies including the CDC and federal and state organizations. These policies and algorithms were followed during the patient's care in the ED.    54 year old female presenting with generalized weakness and altered mentation. Differential diagnosis includes, but is not limited to, alcohol, illicit or prescription medications, or other toxic ingestion; intracranial pathology such as stroke or intracerebral hemorrhage; fever or infectious causes including sepsis; hypoxemia and/or hypercarbia; uremia; trauma; endocrine related disorders such as diabetes, hypoglycemia, and thyroid-related diseases; hypertensive encephalopathy; etc.  Unknown time of altered mentation.  While CVA is certainly in the differential diagnosis, there is no established timeline for patient's altered mentation and thus no criteria to give TPA.  Difficult to obtain NIH stroke scale as patient is unable to cooperate with commands secondary to altered mental status.  Will obtain lab work, urinalysis, CT head, chest x-ray.  Anticipate hospitalization.      ____________________________________________   FINAL CLINICAL IMPRESSION(S) / ED DIAGNOSES  Final diagnoses:  Fall, initial encounter  Weakness  Urinary tract infection without hematuria, site unspecified  Altered mental status, unspecified altered mental status  type  AKI (acute kidney injury) (HCC)  Sepsis, due to unspecified organism, unspecified whether acute organ dysfunction present [facility])     ED Discharge Orders    None       Note:  This document was prepared using Dragon voice recognition software and may include unintentional dictation errors.   Irean Hong, MD 03/19/20 [phone]

## 2020-03-19 NOTE — ED Triage Notes (Signed)
Patient had a fall and fracture left arm, had cast placed on Wednesday and tonight she took off the cast.  Husband reports that while trying to get her here to the hospital she fell twice.

## 2020-03-19 NOTE — Progress Notes (Signed)
Peripherally Inserted Central Catheter Placement  The IV Nurse has discussed with the patient and/or persons authorized to consent for the patient, the purpose of this procedure and the potential benefits and risks involved with this procedure.  The benefits include less needle sticks, lab draws from the catheter, and the patient may be discharged home with the catheter. Risks include, but not limited to, infection, bleeding, blood clot (thrombus formation), and puncture of an artery; nerve damage and irregular heartbeat and possibility to perform a PICC exchange if needed/ordered by physician.  Alternatives to this procedure were also discussed.  Bard Power PICC patient education guide, fact sheet on infection prevention and patient information card has been provided to patient /or left at bedside.  Telephone consent obtained from husband by staff nurses.  Jani Files RN briefly explained procedure to pt at bedside.  Pt lethargic, unable to completely follow commands.  PICC Placement Documentation  PICC Single Lumen 03/19/20 PICC Right Brachial 42 cm 1 cm (Active)  Indication for Insertion or Continuance of Line Limited venous access - need for IV therapy >5 days (PICC only) 03/19/20 1451  Exposed Catheter (cm) 1 cm 03/19/20 1451  Site Assessment Clean;Dry;Intact 03/19/20 1451  Line Status Flushed;Saline locked;Blood return noted 03/19/20 1451  Dressing Type Transparent 03/19/20 1451  Dressing Status Clean;Dry;Intact;Antimicrobial disc in place 03/19/20 1451  Safety Lock Not Applicable 03/19/20 1451  Line Care Connections checked and tightened 03/19/20 1451  Line Adjustment (NICU/IV Team Only) No 03/19/20 1451  Dressing Intervention New dressing 03/19/20 1451  Dressing Change Due 03/26/20 03/19/20 1451       Elliot Dally 03/19/2020, 2:51 PM

## 2020-03-19 NOTE — Progress Notes (Signed)
Phlebotomist in room, unable to get lab draws due to pt being a difficult stick. A second Phlebotomist will come to room and try again. RN looked for a new IV site but didn't see any good veins.

## 2020-03-19 NOTE — Evaluation (Signed)
Was called at 6:40 PM by Dr. Clyde Lundborg to assist with placing a central line in a patient who has no IV access.  Patient had a PICC line placed and pulled out the PICC line 15 minutes after placement.  Patient is a difficult access and has a broken left upper extremity show IVs cannot be placed there.  The patient would not allow me to even look with ultrasound.  He was lucid at the time and I asked her about placement of a central line and she refused.  She is taking p.o.'s well.  Currently she does not appear to be in respiratory distress and she is not hemodynamically unstable.  IV antibiotics are on hold until cultures are back.  If the patient deteriorates and needs central line and agrees to the same she will need transfer to higher level of care as placement of central access on the general ward is not safe.  Currently I would recommend just encouraging p.o. fluids.  If the patient requires as the SDU/ICU level care will be happy to formally consult.  Gailen Shelter, MD [facility]  *This note was dictated using voice recognition software/Dragon.  Despite best efforts to proofread, errors can occur which can change the meaning.  Any change was purely unintentional.

## 2020-03-19 NOTE — ED Notes (Signed)
ED Provider at bedside. 

## 2020-03-19 NOTE — Consult Note (Signed)
Pharmacy Antibiotic Note  [name] is a 54 y.o. female admitted on 03/19/2020 with UTI.  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  Pharmacy has been consulted for Meropenem dosing.  Plan: Start Meropenem 1g IV every 8 hours   Height: 5\' 2"  (157.5 cm) Weight: 110 kg (242 lb 8.1 oz) IBW/kg (Calculated) : 50.1  Temp (24hrs), Avg:98 F (36.7 C), Min:97.6 F (36.4 C), Max:98.4 F (36.9 C)  Recent Labs  Lab 03/19/20 0458  WBC 6.0  CREATININE 1.36*  LATICACIDVEN 2.1*    Estimated Creatinine Clearance: 55.3 mL/min (A) (by C-G formula based on SCr of 1.36 mg/dL (H)).    No Known Allergies  Antimicrobials this admission: 7/3 ceftriaxone x 1 7/3 meropenem>   Microbiology results: 7/3 BCx: pending  7/3 UCx: pending    Thank you for allowing pharmacy to be a part of this patient's care.  9/3, PharmD, BCPS Clinical Pharmacist 03/19/2020 10:02 AM

## 2020-03-19 NOTE — Progress Notes (Signed)
A second phlebotomist tried to draw labs from pt and was unsuccessful. Dr. Clyde Lundborg was notified off difficult stick. RN waiting on response.

## 2020-03-19 NOTE — Consult Note (Signed)
CODE SEPSIS - PHARMACY COMMUNICATION  **Broad Spectrum Antibiotics should be administered within 1 hour of Sepsis diagnosis**  Time Code Sepsis Called/Page Received: 0606  Antibiotics Ordered: ceftriaxone  Time of 1st antibiotic administration: 0618  Ronnald Ramp ,PharmD Clinical Pharmacist  03/19/2020  8:03 AM

## 2020-03-20 ENCOUNTER — Inpatient Hospital Stay: Payer: Medicaid Other

## 2020-03-20 LAB — LIPID PANEL
Cholesterol: 119 mg/dL (ref 0–200)
HDL: 35 mg/dL — ABNORMAL LOW (ref >40)
LDL Cholesterol: 65 mg/dL (ref 0–99)
Total CHOL/HDL Ratio: 3.4 RATIO
Triglycerides: 96 mg/dL (ref <150)
VLDL: 19 mg/dL (ref 0–40)

## 2020-03-20 LAB — URINE CULTURE: Culture: <10000 — AB

## 2020-03-20 LAB — GLUCOSE, CAPILLARY
Glucose-Capillary: 135 mg/dL — ABNORMAL HIGH (ref 70–99)
Glucose-Capillary: 136 mg/dL — ABNORMAL HIGH (ref 70–99)
Glucose-Capillary: 151 mg/dL — ABNORMAL HIGH (ref 70–99)
Glucose-Capillary: 160 mg/dL — ABNORMAL HIGH (ref 70–99)
Glucose-Capillary: 182 mg/dL — ABNORMAL HIGH (ref 70–99)
Glucose-Capillary: 195 mg/dL — ABNORMAL HIGH (ref 70–99)
Glucose-Capillary: 205 mg/dL — ABNORMAL HIGH (ref 70–99)

## 2020-03-20 LAB — BASIC METABOLIC PANEL
Anion gap: 9 (ref 5–15)
BUN: 18 mg/dL (ref 6–20)
CO2: 28 mmol/L (ref 22–32)
Calcium: 8.6 mg/dL — ABNORMAL LOW (ref 8.9–10.3)
Chloride: 105 mmol/L (ref 98–111)
Creatinine, Ser: 0.87 mg/dL (ref 0.44–1.00)
GFR calc Af Amer: >60 mL/min (ref >60)
GFR calc non Af Amer: >60 mL/min (ref >60)
Glucose, Bld: 145 mg/dL — ABNORMAL HIGH (ref 70–99)
Potassium: 3.6 mmol/L (ref 3.5–5.1)
Sodium: 142 mmol/L (ref 135–145)

## 2020-03-20 LAB — CBC
HCT: 25.8 % — ABNORMAL LOW (ref 36.0–46.0)
Hemoglobin: 8.2 g/dL — ABNORMAL LOW (ref 12.0–15.0)
MCH: 20 pg — ABNORMAL LOW (ref 26.0–34.0)
MCHC: 31.8 g/dL (ref 30.0–36.0)
MCV: 62.9 fL — ABNORMAL LOW (ref 80.0–100.0)
Platelets: 179 10*3/uL (ref 150–400)
RBC: 4.1 MIL/uL (ref 3.87–5.11)
RDW: 20.5 % — ABNORMAL HIGH (ref 11.5–15.5)
WBC: 4.5 10*3/uL (ref 4.0–10.5)
nRBC: 0 % (ref 0.0–0.2)

## 2020-03-20 MED ORDER — LISINOPRIL 5 MG PO TABS
5.0000 mg | ORAL_TABLET | Freq: Every day | ORAL | Status: DC
  Administered 2020-03-20 – 2020-03-21 (×2): 5 mg via ORAL
  Filled 2020-03-20 (×2): qty 1

## 2020-03-20 NOTE — Evaluation (Signed)
Occupational Therapy Evaluation Patient Details Name: [name] MRN: [id number] DOB: [birth year] Today's Date: 03/20/2020    History of Present Illness Pt. is a 54 y.o. female who was admitted to [facility] UTI, AMS, recent recurrent falls, recent Left Distal radius fracture.   Clinical Impression   Pt. presents with weakness, left distal radius fracture, limited vision, limited activity tolerance, and limited functional mobility which hinders her ability to complete basic ADL and IADL functioning. Pt. Resides at home with her husband.    Pt. Required assist with all ADLs, and IADL tasks from her husband. Pt. Required assist with meal preparation, medication management, and performing all home management, and household tasks.  Pt. Sustained a recent left distal radius fracture. Pt. Removed the cast on her own this past Friday. Pt. Presents with increased edema in her hand. Pt. Is currently waiting an orthopedic consult for the wrist while she in the hospital. Pt. Was assisted with repositioning the LUE on pillowsin elevation secondary to increased edema. Pt. Could benefit from OT services for ADL training, A/E training, and pt. Education about  Visual compensatory strategies, home modification, and DME. Pt. would benefit from SNF level of care upon discharge, with follow-up OT services.    Follow Up Recommendations  SNF;Supervision/Assistance - 24 hour    Equipment Recommendations  None recommended by OT    Recommendations for Other Services       Precautions / Restrictions Precautions Precautions: Fall Restrictions Weight Bearing Restrictions: Yes LUE Weight Bearing: Non weight bearing      Mobility Bed Mobility Overal bed mobility: Needs Assistance Bed Mobility: Supine to Sit     Supine to sit: Supervision     General bed mobility comments: Pt. up in chair upon arrival  Transfers Overall transfer level: Needs assistance Equipment used: 2 person hand held assist Transfers:  Sit to/from Stand Sit to Stand: Min assist;+2 safety/equipment         General transfer comment: Mobility per PT report    Balance                           ADL either performed or assessed with clinical judgement   ADL Overall ADL's : Needs assistance/impaired Eating/Feeding: Set up Eating/Feeding Details (indicate cue type and reason): Using RUE only, complete set-up needed Grooming: Minimal assistance;Set up Grooming Details (indicate cue type and reason): Using RUE only Upper Body Bathing: Maximal assistance   Lower Body Bathing: Maximal assistance   Upper Body Dressing : Maximal assistance   Lower Body Dressing: Maximal assistance                       Vision Patient Visual Report: No change from baseline Vision Assessment?: Vision impaired- to be further tested in functional context Additional Comments: Per husband, 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     Perception     Praxis      Pertinent Vitals/Pain Pain Assessment: No/denies pain Faces Pain Scale: Hurts little more Pain Location: grimacing with WBing attempts, indicates discomfort in bilat LEs (knees?) Pain Descriptors / Indicators: Grimacing Pain Intervention(s): Limited activity within patient's tolerance;Monitored during session;Repositioned     Hand Dominance Right   Extremity/Trunk Assessment Upper Extremity Assessment Upper Extremity Assessment: Generalized weakness (RUE 5/5, LUE proximally 4-/5. Distalyy not assessed secondary to wrist fracture with cast removed.)   Lower Extremity Assessment Lower Extremity Assessment: Generalized weakness (R LE grossly 4-/5, L LE grossly 3+  to 4-/5; limited active effort with formal testing.  does support body weight without buckling)       Communication Communication Communication: No difficulties   Cognition Arousal/Alertness: Awake/alert Behavior During Therapy: Flat affect Overall Cognitive Status: History of cognitive  impairments - at baseline Area of Impairment: Attention;Problem solving;Awareness                   Current Attention Level: Sustained   Following Commands: Follows one step commands with increased time     Problem Solving: Decreased initiation;Slow processing;Requires verbal cues;Requires tactile cues General Comments: Oriented to self, location; follows simple commands with increased time for processing and initiation   General Comments       Exercises Other Exercises Other Exercises: Educated in role of PT and progressive mobiltiy in acute setting; discussed current performance and safety needs with transfers. Patient/husband voiced understanding Other Exercises: Educated in WBing/movement restrictions to L UE (currently ace wrapped, as patient removed cast), encouraged in positioning/elevation for edema management.  Elevated on pillows once patient in chair.   Shoulder Instructions      Home Living Family/patient expects to be discharged to:: Private residence Living Arrangements: Spouse/significant other Available Help at Discharge: Family;Available PRN/intermittently Type of Home: House Home Access: Stairs to enter [employer] of Steps: 4-5 in front with rails, 2-3 in back without rails (prefers front)   Home Layout: One level     Bathroom Shower/Tub: Tub/shower unit;Curtain   Firefighter: Standard     Home Equipment: Cane - single point;Walker - 2 wheels;Grab bars - tub/shower;Shower seat;Hospital bed      Lives With: Spouse    Prior Functioning/Environment Level of Independence: Needs assistance  Gait / Transfers Assistance Needed:  ADL's / Homemaking Assistance Needed: Pt.'s husband assists with ADLs, and IADLs, medication management, and meal preparation.   Comments: Pt. does not drive        OT Problem List: Decreased strength;Decreased range of motion;Decreased activity tolerance;Impaired balance (sitting and/or standing);Decreased  cognition;Decreased safety awareness;Decreased knowledge of use of DME or AE      OT Treatment/Interventions: Self-care/ADL training;Therapeutic exercise;Energy conservation;DME and/or AE instruction;Therapeutic activities;Cognitive remediation/compensation;Patient/family education;Balance training    OT Goals(Current goals can be found in the care plan section) Acute Rehab OT Goals Patient Stated Goal: per husband, to get some help and keep her stronger OT Goal Formulation: With patient/family Time For Goal Achievement: 03/10/20 Potential to Achieve Goals: Good  OT Frequency: Min 1X/week   Barriers to D/C: Decreased caregiver support          Co-evaluation              AM-PAC OT "6 Clicks" Daily Activity     Outcome Measure Help from another person eating meals?: A Little Help from another person taking care of personal grooming?: A Little Help from another person toileting, which includes using toliet, bedpan, or urinal?: A Lot Help from another person bathing (including washing, rinsing, drying)?: A Lot Help from another person to put on and taking off regular upper body clothing?: A Lot Help from another person to put on and taking off regular lower body clothing?: A Lot 6 Click Score: 14   End of Session    Activity Tolerance: Patient tolerated treatment well Patient left: in bed;with call bell/phone within reach;with bed alarm set;with family/visitor present  OT Visit Diagnosis: Other abnormalities of gait and mobility (R26.89);Muscle weakness (generalized) (M62.81)  Time: 1040-1100 OT Time Calculation (min): 20 min Charges:  OT General Charges $OT Visit: 1 Visit OT Evaluation $OT Eval Low Complexity: 1 Low  Olegario Messier, MS, OTR/L   Olegario Messier 03/20/2020, 12:46 PM

## 2020-03-20 NOTE — Progress Notes (Signed)
PROGRESS NOTE    [name]  ZOX:[id number]  DOB: September 27, 1965  DOA: 03/19/2020 [facility]: [facility] Outpatient Specialists:   Hospital course:  [name] is a 54 y.o. female with medical history significant of hypertension, hyperlipidemia, diabetes mellitus, stroke, [name], depression, anxiety, seizure, who presents with AMS and fall.   Per report, pt recently had fall and was seen in ED 6/29, and found to have left distal radius fracture.Wrist splint was placed.Patient was discharged home with prescriptions for a low-dose oftylenol with codeine. Patient is to follow up withorthopedics. Per EDP, pt was noted to be confused and has generalized weakness. Patient took off wrist splint. She fell twice due to her legs buckling when her husband was trying to get her here to the hospital. He was able to catch her and she did not strike her head. Her husband did not think patient accidentally took too many pain medicines. When I saw pt in ED, she is very drowsy, but arousable.  Patient is still orientated x3. She has pain in left wrist.  She denies chest pain, cough, shortness of breath.  No fever or chills.  No nausea vomiting, diarrhea or abdominal pain.  Denies symptoms of UTI.  She moves all extremities.  No facial droop or slurred speech.   Subjective:  Patient is unable to provide a history.  History is entirely per patient's husband.  Patient's husband states that overall patient has had a significant decline since 2014 when she started having strokes.  Over the past several months to years she has been nonverbal and increasingly bedbound.  She apparently is able to walk sometimes with a walker sometimes holding onto the walls.  She has been having more falls lately perhaps over the past several weeks may be months.  She fell last week and had a distal radial fracture however she pulled the splint off and try to walk again and fell again.  Patient's husband states that  this is her baseline at present and that she sometimes cooperative and sometimes she is not at home.  He is not really sure if she has had a significant decline recently.  Mostly he feels that she has had a slow steady decline over the past several years to months.  Objective: Vitals:   03/20/20 0006 03/20/20 0300 03/20/20 0442 03/20/20 0751  BP: 119/63  (!) 142/70 (!) 144/68  Pulse: 82  79 84  Resp: 20  19 20   Temp: 98.1 F (36.7 C)  98.1 F (36.7 C) 97.7 F (36.5 C)  TempSrc: Oral  Oral Oral  SpO2: 100%  100% 100%  Weight:  120.2 kg    Height:        Intake/Output Summary (Last 24 hours) at 03/20/2020 1514 Last data filed at 03/20/2020 1425 Gross per 24 hour  Intake 480 ml  Output 3000 ml  Net -2520 ml   Filed Weights   03/19/20 0435 03/20/20 0300  Weight: 110 kg 120.2 kg     Exam:  General: Nonverbal female lying flat in bed not really responding to me but occasionally looking at me and occasionally ignoring me.  Husband is at bedside. Eyes: sclera anicteric, conjuctiva mild injection bilaterally CVS: S1-S2, regular  Respiratory:  decreased air entry bilaterally secondary to decreased inspiratory effort, rales at bases  GI: NABS, soft, NT  LE: No edema.  Neuro: Patient is awake and alert and nonverbal.  She has multiple neuro deficits from previous strokes. Psych: Nonverbal uncooperative patient.  Assessment & Plan:   54 year old female with history of HTN, DM 2, HL, multiple strokes and seizure disorder has had functional decline over the past several years since 2014 when she had her initial stroke and then subsequent several strokes was admitted yesterday for concerns of a UTI after multiple falls over the past several weeks.  Of note, patient has no IV access as she was noncooperative with central line which time to be placed yesterday.  However as patient does not appear to have any acute infection, we have agreed to not place a PICC line unless need for IV access  becomes urgent.  Abnormal UA  Abnormal UA with noon positive nitrites and new increase in WBCs since last week but no growth by culture with less than 10,000 CFU. I do not think there are any clinical signs of UTI although it is difficult to assess as patient is nonverbal; however husband seems to indicate that her decline has been progressive over the past several weeks to months rather than acute over the past couple of days. Also of note patient is afebrile with no leukocytosis Discussed with Dr. Algis LimingVandam of ID who notes no real indication for diagnosis of UTI He did recommend checking blood cultures as Granulicatella is a fastidious organism that can sometimes rarely be associated with endocarditis. Will discontinue meropenem. Await blood culture results.  Mental status Patient's husband states this is her usual baseline mental status and she does not have acute altered mental status.  He notes that she is sometimes cooperative sometimes not even at home for several years.  Distal radial fracture_left: As needed Tylenol for pain Patient has taken off her splint, can consider resplinting or have patient follow-up as outpatient with orthopedics.  Fall Patient is bedbound for most of the time according to her husband although she apparently does intermittently walk either with her walker or holding the walls. Will ask PT and OT for their evaluation.  AKI  Resolved with IV fluids  HTN (hypertension): Restart lisinopril, continue metoprolol  Diabetes mellitus without complication  Hold Metformin Levemir decreased from 35 to 25 units with SSI coverage  Hyperlipidemia Continue Lipitor  Seizure Continue: Depakote Seizure precautions  Cerebrovascular disease/history of stroke Continue aspirin, Plavix, Lipitor   DVT prophylaxis: Lovenox Code Status: Full Family Communication: Patient's husband was at bedside throughout Disposition Plan:   Patient is from: Home   Anticipated Discharge Location: TBD  Barriers to Discharge: Multiple falls  Is patient medically stable for Discharge: No   Consultants:  None  Procedures:  None  Antimicrobials:  None   Data Reviewed:  Basic Metabolic Panel: Recent Labs  Lab 03/19/20 0458 03/20/20 0507  NA 141 142  K 4.0 3.6  CL 105 105  CO2 26 28  GLUCOSE 151* 145*  BUN 27* 18  CREATININE 1.36* 0.87  CALCIUM 8.8* 8.6*   Liver Function Tests: Recent Labs  Lab 03/19/20 0458  AST 20  ALT 13  ALKPHOS 31*  BILITOT 0.9  PROT 7.1  ALBUMIN 3.5   No results for input(s): LIPASE, AMYLASE in the last 168 hours. No results for input(s): AMMONIA in the last 168 hours. CBC: Recent Labs  Lab 03/19/20 0458 03/20/20 0507  WBC 6.0 4.5  NEUTROABS 3.1  --   HGB 8.7* 8.2*  HCT 27.3* 25.8*  MCV 63.5* 62.9*  PLT 189 179   Cardiac Enzymes: No results for input(s): CKTOTAL, CKMB, CKMBINDEX, TROPONINI in the last 168 hours. BNP (last 3 results) No results  for input(s): PROBNP in the last 8760 hours. CBG: Recent Labs  Lab 03/19/20 1955 03/20/20 0000 03/20/20 0437 03/20/20 0800 03/20/20 1157  GLUCAP 157* 151* 136* 135* 195*    Recent Results (from the past 240 hour(s))  Urine culture     Status: Abnormal   Collection Time: 03/19/20  5:07 AM   Specimen: Urine, Catheterized  Result Value Ref Range Status   Specimen Description   Final    URINE, CATHETERIZED Performed at [facility], [address] [place], [place] [address]    Special Requests   Final    NONE Performed at [facility], [address], [place], [place] [address]    Culture (A)  Final    <10,000 COLONIES/mL INSIGNIFICANT GROWTH Performed at [facility], [address] [place], [place] [address]    Report Status 03/20/2020 FINAL  Final  SARS Coronavirus 2 by RT PCR (hospital order, performed in [facility] hospital lab) Nasopharyngeal Nasopharyngeal Swab     Status: None   Collection Time:  03/19/20  5:20 AM   Specimen: Nasopharyngeal Swab  Result Value Ref Range Status   SARS Coronavirus 2 NEGATIVE NEGATIVE Final    Comment: (NOTE) SARS-CoV-2 target nucleic acids are NOT DETECTED.  The SARS-CoV-2 RNA is generally detectable in upper and lower respiratory specimens during the acute phase of infection. The lowest concentration of SARS-CoV-2 viral copies this assay can detect is 250 copies / mL. A negative result does not preclude SARS-CoV-2 infection and should not be used as the sole basis for treatment or other patient management decisions.  A negative result may occur with improper specimen collection / handling, submission of specimen other than nasopharyngeal swab, presence of viral mutation(s) within the areas targeted by this assay, and inadequate number of viral copies (<250 copies / mL). A negative result must be combined with clinical observations, patient history, and epidemiological information.  Fact Sheet for Patients:   BoilerBrush.com.cy  Fact Sheet for Healthcare Providers: https://pope.com/  This test is not yet approved or  cleared by the [place] FDA and has been authorized for detection and/or diagnosis of SARS-CoV-2 by FDA under an Emergency Use Authorization (EUA).  This EUA will remain in effect (meaning this test can be used) for the duration of the COVID-19 declaration under Section 564(b)(1) of the Act, 21 U.S.C. section 360bbb-3(b)(1), unless the authorization is terminated or revoked sooner.  Performed at [facility], [address] [place], [place] [address]       Studies: CT Head Wo Contrast  Result Date: 03/19/2020 CLINICAL DATA:  54 year old female with history of confusion. Fall with fracture of left arm. EXAM: CT HEAD WITHOUT CONTRAST TECHNIQUE: Contiguous axial images were obtained from the base of the skull through the vertex without intravenous contrast.  COMPARISON:  Head CT 02/24/2020. FINDINGS: Brain: Areas of low attenuation throughout the right frontal, parietal and temporal regions compatible with encephalomalacia from remote right MCA territory infarction, similar to the prior examination. Well-defined focus of low attenuation in the medial aspect of the left basal ganglia, similar to the prior study, compatible with an old lacunar infarct. Patchy and confluent areas of decreased attenuation are noted throughout the deep and periventricular white matter of the cerebral hemispheres bilaterally, compatible with chronic microvascular ischemic disease. No evidence of acute infarction, hemorrhage, hydrocephalus, extra-axial collection or mass lesion/mass effect. Vascular: No hyperdense vessel or unexpected calcification. Skull: Normal. Negative for fracture or focal lesion. Sinuses/Orbits: No acute finding. Other:  None. IMPRESSION: 1. No acute intracranial abnormalities. 2. Old right MCA territory infarct, left basal ganglia lacunar infarct and chronic microvascular ischemic changes in the cerebral white matter, similar to prior studies, as above. Electronically Signed   By: Trudie Reed M.D.   On: 03/19/2020 07:13   DG Chest Port 1 View  Result Date: 03/20/2020 CLINICAL DATA:  Weakness, UTI EXAM: PORTABLE CHEST 1 VIEW COMPARISON:  03/19/2020 FINDINGS: Trachea is midline. Cardiomediastinal contours with persistent cardiac enlargement. Pulmonary vascular engorgement centrally. No consolidation.  No sign of pleural effusion. Visualized skeletal structures on limited assessment are unremarkable. IMPRESSION: Cardiomegaly with pulmonary vascular engorgement. Electronically Signed   By: Donzetta Kohut M.D.   On: 03/20/2020 12:36   DG Chest Port 1 View  Result Date: 03/19/2020 CLINICAL DATA:  54 year old female with history of weakness. EXAM: PORTABLE CHEST 1 VIEW COMPARISON:  Chest x-ray 02/25/2020. FINDINGS: Lung volumes are normal. No consolidative airspace  disease. No pleural effusions. No pneumothorax. No pulmonary nodule or mass noted. No evidence of pulmonary edema. Heart size is mildly enlarged. Upper mediastinal contours are within normal limits. IMPRESSION: 1. No radiographic evidence of acute cardiopulmonary disease. 2. Mild cardiomegaly. Electronically Signed   By: Trudie Reed M.D.   On: 03/19/2020 06:37   [place] EKG SITE RITE  Result Date: 03/19/2020 If Site Rite image not attached, placement could not be confirmed due to current cardiac rhythm.  [place] EKG SITE RITE  Result Date: 03/19/2020 If Site Rite image not attached, placement could not be confirmed due to current cardiac rhythm.    Scheduled Meds: . aspirin EC  81 mg Oral Daily  . atorvastatin  40 mg Oral q1800  . Chlorhexidine Gluconate Cloth  6 each Topical Daily  . clopidogrel  75 mg Oral Daily  . darifenacin  7.5 mg Oral Daily  . divalproex  250 mg Oral Daily  . divalproex  500 mg Oral QHS  . enoxaparin (LOVENOX) injection  40 mg Subcutaneous Q12H  . insulin aspart  0-9 Units Subcutaneous Q4H  . insulin detemir  25 Units Subcutaneous Q2200  . metoprolol tartrate  25 mg Oral BID  . saccharomyces boulardii  250 mg Oral BID  . sodium chloride flush  10-40 mL Intracatheter Q12H   Continuous Infusions: . sodium chloride 75 mL/hr at 03/19/20 0818    Principal Problem:   UTI (urinary tract infection) Active Problems:   HTN (hypertension)   Diabetes mellitus without complication (HCC)   Hyperlipidemia   Acute metabolic encephalopathy   Seizure (HCC)   Elevated troponin   AKI (acute kidney injury) (HCC)   Fall   Iron deficiency anemia   Stroke (HCC)   Acute respiratory failure with hypoxia (HCC)   Distal radial fracture_left     Pieter Partridge, [facility]  If 7PM-7AM, please contact night-coverage [email or web address] Password [email or web address] 03/20/2020, 3:14 PM    LOS: 1 day

## 2020-03-20 NOTE — Evaluation (Signed)
Physical Therapy Evaluation Patient Details Name: [name] MRN: [id number] DOB: 1965/12/19 Today's Date: 03/20/2020   History of Present Illness  presented to ER secondary to AMS, recurrent falls (with recent L distal radius fracture); admitted for management of UTI  Clinical Impression  Patient resting in bed, husband and sitter at bedside in room.  Patient agreeable to session with min encouragement from therapist and husband. Alert and oriented to self and location; grossly delayed in processing and initiation of all activities (conversation and mobility). L UE with ace wrap in place due to recent distal radius fracture (per husband, care team, patient removed cast herself); maintained NWB and immobilized for protection throughout evaluation.  Would benefit from ortho consult to readdress. Mild baseline weakness noted throughout L hemi-body (history of CVA); minimally changed from baseline.  Able to mobilize all extremities against gravity; able to support body weight in standing.  Denies sensory deficit.  Does require min cuing to avoid use of L UE.  Currently requiring close sup for bed mobility; min assist +2 for sit/stand, basic transfers and short-distance gait (5') with bilat HHA (L UE WBing through elbow).  Demonstrates broad BOS, poor balance noted-increased sway in all directions; very short, shuffled steps; decreased weight acceptance/stance time R LE. Unsafe to attempt without +2 at this time Would benefit from skilled PT to address above deficits and promote optimal return to PLOF.; recommend transition to STR upon discharge from acute hospitalization.     Follow Up Recommendations SNF    Equipment Recommendations       Recommendations for Other Services       Precautions / Restrictions Precautions Precautions: Fall Restrictions Weight Bearing Restrictions: Yes LUE Weight Bearing: Non weight bearing      Mobility  Bed Mobility Overal bed mobility: Needs Assistance Bed  Mobility: Supine to Sit     Supine to sit: Supervision     General bed mobility comments: transition towards L, prefers transition through long-sitting; min cuing for NWB L UE  Transfers Overall transfer level: Needs assistance Equipment used: 2 person hand held assist (L UE WBing through elbow) Transfers: Sit to/from Stand Sit to Stand: Min assist;+2 safety/equipment         General transfer comment: broad BOS, heavy use of momentum; increased time/effort to complete  Ambulation/Gait Ambulation/Gait assistance: Min assist;+2 safety/equipment Gait Distance (Feet): 5 Feet Assistive device: 2 person hand held assist (L UE WBing through elbow)       General Gait Details: broad BOS, poor balance noted-increased sway in all directions; very short, shuffled steps; decreased weight acceptance/stance time R LE. Unsafe to attempt without +2 at this time  Stairs            Wheelchair Mobility    Modified Rankin (Stroke Patients Only)       Balance Overall balance assessment: Needs assistance Sitting-balance support: No upper extremity supported;Feet supported Sitting balance-Leahy Scale: Good     Standing balance support: Bilateral upper extremity supported Standing balance-Leahy Scale: Poor Standing balance comment: constant cuing/assist for NWB L wrist; poor balance, all planes                             Pertinent Vitals/Pain Pain Assessment: Faces Faces Pain Scale: Hurts little more Pain Location: grimacing with WBing attempts, indicates discomfort in bilat LEs (knees?) Pain Descriptors / Indicators: Grimacing Pain Intervention(s): Limited activity within patient's tolerance;Monitored during session;Repositioned    Home Living Family/patient expects to  be discharged to:: Private residence Living Arrangements: Spouse/significant other Available Help at Discharge: Family;Available PRN/intermittently Type of Home: House Home Access: Stairs to  enter   [employer] of Steps: 4-5 in front with rails, 2-3 in back without rails (prefers front) Home Layout: One level Home Equipment: Cane - single point;Walker - 2 wheels;Grab bars - tub/shower;Shower seat;Hospital bed      Prior Function Level of Independence: Needs assistance   Gait / Transfers Assistance Needed: Per husband, patient will transfer and perform short mobility distances within the home with his assistance.  With a walker when they can remind her or heavily leaning on furniture/walls if she self initiates.  Does endorse recurrent falls within the home, at least 3 in week prior to admission.  ADL's / Homemaking Assistance Needed: Husband performs all IADLs and homemaking.  Per husband (or daughter), he assists with all self-care.  Patient is incontinent.  Pt's husband also reports pt frequently wanders at night and will walk outside which is unsafe.        Hand Dominance   Dominant Hand: Right    Extremity/Trunk Assessment   Upper Extremity Assessment Upper Extremity Assessment: Generalized weakness (R UE grossly 4+ to 5/5, L UE grossly 4-/5 (pronation/supination, wrist and hand not tested due to known wrist fracture))    Lower Extremity Assessment Lower Extremity Assessment: Generalized weakness (R LE grossly 4-/5, L LE grossly 3+ to 4-/5; limited active effort with formal testing.  does support body weight without buckling)       Communication   Communication: No difficulties (delayed processing/initiation; able to make wants/needs known when she chooses to participate with conversation)  Cognition Arousal/Alertness: Awake/alert Behavior During Therapy: Flat affect                                   General Comments: Oriented to self, location; follows simple commands with increased time for processing and initiation      General Comments      Exercises Other Exercises Other Exercises: Educated in role of PT and progressive  mobiltiy in acute setting; discussed current performance and safety needs with transfers. Patient/husband voiced understanding Other Exercises: Educated in WBing/movement restrictions to L UE (currently ace wrapped, as patient removed cast), encouraged in positioning/elevation for edema management.  Elevated on pillows once patient in chair.   Assessment/Plan    PT Assessment Patient needs continued PT services  PT Problem List Decreased strength;Decreased range of motion;Decreased activity tolerance;Decreased balance;Decreased mobility;Decreased coordination;Cardiopulmonary status limiting activity;Decreased cognition;Decreased knowledge of use of DME;Decreased safety awareness;Decreased knowledge of precautions;Obesity;Pain       PT Treatment Interventions DME instruction;Gait training;Stair training;Functional mobility training;Therapeutic activities;Therapeutic exercise;Balance training;Neuromuscular re-education;Cognitive remediation;Patient/family education    PT Goals (Current goals can be found in the Care Plan section)  Acute Rehab PT Goals Patient Stated Goal: per husband, to get some help and keep her stronger PT Goal Formulation: With patient Time For Goal Achievement: 04/03/20 Potential to Achieve Goals: Fair    Frequency Min 2X/week   Barriers to discharge        Co-evaluation               AM-PAC PT "6 Clicks" Mobility  Outcome Measure Help needed turning from your back to your side while in a flat bed without using bedrails?: None Help needed moving from lying on your back to sitting on the side of a flat bed without using  bedrails?: None Help needed moving to and from a bed to a chair (including a wheelchair)?: A Lot Help needed standing up from a chair using your arms (e.g., wheelchair or bedside chair)?: A Lot Help needed to walk in hospital room?: A Lot Help needed climbing 3-5 steps with a railing? : Total 6 Click Score: 15    End of Session Equipment  Utilized During Treatment: Gait belt Activity Tolerance: Patient tolerated treatment well Patient left: in chair;with call bell/phone within reach;with family/visitor present;with nursing/sitter in room Nurse Communication: Mobility status PT Visit Diagnosis: Unsteadiness on feet (R26.81);Muscle weakness (generalized) (M62.81);Difficulty in walking, not elsewhere classified (R26.2);Other abnormalities of gait and mobility (R26.89);Hemiplegia and hemiparesis;Repeated falls (R29.6)    Time: 7473-4037 PT Time Calculation (min) (ACUTE ONLY): 24 min   Charges:   PT Evaluation $PT Eval Moderate Complexity: 1 Mod PT Treatments $Therapeutic Activity: 8-22 mins        Monserratt Knezevic H. Manson Passey, PT, DPT, NCS 03/20/20, 10:44 AM [phone]

## 2020-03-21 DIAGNOSIS — S52502S Unspecified fracture of the lower end of left radius, sequela: Secondary | ICD-10-CM

## 2020-03-21 LAB — CBC
HCT: 31.6 % — ABNORMAL LOW (ref 36.0–46.0)
Hemoglobin: 10.2 g/dL — ABNORMAL LOW (ref 12.0–15.0)
MCH: 20.2 pg — ABNORMAL LOW (ref 26.0–34.0)
MCHC: 32.3 g/dL (ref 30.0–36.0)
MCV: 62.7 fL — ABNORMAL LOW (ref 80.0–100.0)
Platelets: 205 10*3/uL (ref 150–400)
RBC: 5.04 MIL/uL (ref 3.87–5.11)
RDW: 20.9 % — ABNORMAL HIGH (ref 11.5–15.5)
WBC: 5.3 10*3/uL (ref 4.0–10.5)
nRBC: 0 % (ref 0.0–0.2)

## 2020-03-21 LAB — BASIC METABOLIC PANEL
Anion gap: 11 (ref 5–15)
BUN: 10 mg/dL (ref 6–20)
CO2: 26 mmol/L (ref 22–32)
Calcium: 9.1 mg/dL (ref 8.9–10.3)
Chloride: 103 mmol/L (ref 98–111)
Creatinine, Ser: 0.83 mg/dL (ref 0.44–1.00)
GFR calc Af Amer: >60 mL/min (ref >60)
GFR calc non Af Amer: >60 mL/min (ref >60)
Glucose, Bld: 170 mg/dL — ABNORMAL HIGH (ref 70–99)
Potassium: 3.5 mmol/L (ref 3.5–5.1)
Sodium: 140 mmol/L (ref 135–145)

## 2020-03-21 LAB — GLUCOSE, CAPILLARY
Glucose-Capillary: 172 mg/dL — ABNORMAL HIGH (ref 70–99)
Glucose-Capillary: 146 mg/dL — ABNORMAL HIGH (ref 70–99)
Glucose-Capillary: 209 mg/dL — ABNORMAL HIGH (ref 70–99)
Glucose-Capillary: 159 mg/dL — ABNORMAL HIGH (ref 70–99)
Glucose-Capillary: 214 mg/dL — ABNORMAL HIGH (ref 70–99)

## 2020-03-21 MED ORDER — LISINOPRIL 20 MG PO TABS
20.0000 mg | ORAL_TABLET | Freq: Every day | ORAL | Status: DC
  Administered 2020-03-22: 20 mg via ORAL
  Filled 2020-03-21: qty 1

## 2020-03-21 NOTE — [place] FL2 (Addendum)
[place] MEDICAID FL2 LEVEL OF CARE SCREENING TOOL     IDENTIFICATION  Patient Name: [name] Birthdate: 09-23-1965 Sex: female Admission Date (Current Location): 03/19/2020  [place] and [place] Number:  Chiropodist and Address:  [facility], [address] [place], [place] [address]      Provider Number: [id number]  Attending Physician Name and Address:  Arnetha Courser, MD  Relative Name and Phone Number:  Shatha Hooser Husband [phone]    Current Level of Care: Hospital Recommended Level of Care: Skilled Nursing Facility Prior Approval Number:    Date Approved/Denied:   PASRR Number: [id number]  Discharge Plan: SNF (Long Term Bed Placement)    Current Diagnoses: Patient Active Problem List   Diagnosis Date Noted  . UTI (urinary tract infection) 03/19/2020  . Distal radial fracture_left 03/19/2020  . Stroke (HCC)   . Acute respiratory failure with hypoxia (HCC)   . Sepsis (HCC)   . Pneumonia of left lower lobe due to infectious organism   . Iron deficiency anemia   . Shortness of breath   . Lobar pneumonia (HCC)   . Weakness 12/20/2019  . Hemiparesis affecting left side as late effect of cerebrovascular accident (CVA) (HCC) 12/20/2019  . Type 2 diabetes mellitus with hyperlipidemia (HCC) 12/20/2019  . Seizure disorder as sequela of cerebrovascular accident (HCC) 12/20/2019  . Urinary incontinence 12/20/2019  . Self-care deficit 12/20/2019  . Fall   . Lacunar stroke, acute (HCC) 07/24/2019  . Seizure (HCC) 07/24/2019  . Elevated troponin 07/24/2019  . AKI (acute kidney injury) (HCC) 07/24/2019  . Acute metabolic encephalopathy 07/23/2019  . Cerebral infarction (HCC) 08/14/2016  . Recurrent cerebrovascular accidents (CVAs) (HCC) 08/14/2016  . Delirium due to another medical condition 08/13/2016  . Urinary tract infection 08/13/2016  . Right foot infection 05/11/2016  . Foot abscess, right 05/11/2016  . Anxiety and  depression 12/02/2015  . CVA (cerebral infarction) 11/27/2015  . Epilepsy (HCC) 06/17/2015  . 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 Headache 05/19/2015  . Weakness of left side of body 05/19/2015  . History of cardioembolic cerebrovascular accident (CVA) 05/19/2015  . Morbid obesity with BMI of 40.0-44.9, adult (HCC) 03/19/2014  . HTN (hypertension)   . Diabetes mellitus without complication (HCC)   . Hyperlipidemia   . [name] (transient ischemic attack) 03/17/2014    Orientation RESPIRATION BLADDER Height & Weight     Self, Place  Normal External catheter, Incontinent Weight: 120.2 kg Height:  5\' 2"  (157.5 cm)  BEHAVIORAL SYMPTOMS/MOOD NEUROLOGICAL BOWEL NUTRITION STATUS      incontinent Dysphagia 2 with nectar thick  AMBULATORY STATUS COMMUNICATION OF NEEDS Skin   Limited Assist Verbally Normal                       Personal Care Assistance Level of Assistance  Bathing, Feeding, Dressing Bathing Assistance: Limited assistance Feeding assistance: Independent Dressing Assistance: Limited assistance     Functional Limitations Info  Sight, Speech, Hearing Sight Info: Adequate   Speech Info: Adequate    SPECIAL CARE FACTORS FREQUENCY                       Contractures Contractures Info: Not present    Additional Factors Info  Code Status, Allergies Code Status Info: Full Code Allergies Info: NKDA           Current Medications (03/21/2020):  This is the current hospital active medication list Current Facility-Administered Medications  Medication  Dose Route Frequency Provider Last Rate Last Admin  . 0.9 %  sodium chloride infusion   Intravenous Continuous Lorretta Harp, MD 75 mL/hr at 03/19/20 0818 New Bag at 03/19/20 0818  . acetaminophen (TYLENOL) tablet 650 mg  650 mg Oral Q6H PRN Lorretta Harp, MD       Or  . acetaminophen (TYLENOL) suppository 650 mg  650 mg Rectal Q6H PRN Lorretta Harp, MD      . albuterol (PROVENTIL) (2.5 MG/3ML) 0.083% nebulizer solution 2.5  mg  2.5 mg Nebulization Q4H PRN Lorretta Harp, MD      . ALPRAZolam Prudy Feeler) tablet 0.25 mg  0.25 mg Oral TID PRN Manuela Schwartz, NP   0.25 mg at 03/20/20 2055  . aspirin EC tablet 81 mg  81 mg Oral Daily Lorretta Harp, MD   81 mg at 03/21/20 0806  . atorvastatin (LIPITOR) tablet 40 mg  40 mg Oral q1800 Lorretta Harp, MD   40 mg at 03/20/20 1702  . Chlorhexidine Gluconate Cloth 2 % PADS 6 each  6 each Topical Daily Lorretta Harp, MD      . clopidogrel (PLAVIX) tablet 75 mg  75 mg Oral Daily Lorretta Harp, MD   75 mg at 03/21/20 0805  . darifenacin (ENABLEX) 24 hr tablet 7.5 mg  7.5 mg Oral Daily Lorretta Harp, MD   7.5 mg at 03/21/20 9242  . divalproex (DEPAKOTE) DR tablet 250 mg  250 mg Oral Daily Lorretta Harp, MD   250 mg at 03/21/20 0804  . divalproex (DEPAKOTE) DR tablet 500 mg  500 mg Oral QHS Lorretta Harp, MD   500 mg at 03/20/20 2055  . enoxaparin (LOVENOX) injection 40 mg  40 mg Subcutaneous Q12H Lorretta Harp, MD   40 mg at 03/20/20 2059  . hydrALAZINE (APRESOLINE) injection 5 mg  5 mg Intravenous Q2H PRN Lorretta Harp, MD      . insulin aspart (novoLOG) injection 0-9 Units  0-9 Units Subcutaneous Q4H Lorretta Harp, MD   1 Units at 03/21/20 0805  . insulin detemir (LEVEMIR) injection 25 Units  25 Units Subcutaneous Q2200 Lorretta Harp, MD   25 Units at 03/20/20 2028  . lisinopril (ZESTRIL) tablet 5 mg  5 mg Oral Daily Leandro Reasoner Tublu, MD   5 mg at 03/21/20 0805  . LORazepam (ATIVAN) injection 1 mg  1 mg Intramuscular Q2H PRN Manuela Schwartz, NP      . metoprolol tartrate (LOPRESSOR) tablet 25 mg  25 mg Oral BID Lorretta Harp, MD   25 mg at 03/21/20 0805  . saccharomyces boulardii (FLORASTOR) capsule 250 mg  250 mg Oral BID Lorretta Harp, MD   250 mg at 03/21/20 0804  . sodium chloride flush (NS) 0.9 % injection 10-40 mL  10-40 mL Intracatheter Q12H Lorretta Harp, MD      . sodium chloride flush (NS) 0.9 % injection 10-40 mL  10-40 mL Intracatheter PRN Lorretta Harp, MD         Discharge Medications: Please see  discharge summary for a list of discharge medications.  Relevant Imaging Results:  Relevant Lab Results:   Additional Information SSN:[id number]  Barrie Dunker, RN

## 2020-03-21 NOTE — Progress Notes (Signed)
PROGRESS NOTE    [name]  UJW:[id number] DOB: 01/19/1966 DOA: 03/19/2020 PCP: [facility]   Brief Narrative:  [name] a 54 y.o.femalewith medical history significant ofhypertension, hyperlipidemia, diabetes mellitus, stroke, [name], depression, anxiety, seizure,who presents with AMS and fall.   Per report, pt recently had fall and wasseen in ED 6/29, and found to have left distal radius fracture.Wrist splint was placed.Patientwasdischarged home with prescriptions for a low-dose oftylenol with codeine. Patient is to follow up withorthopedics.Per EDP, pt was noted to be confused and hasgeneralized weakness. Patient took offwrist splint.She felltwice due to her legs bucklingwhen her husbandwas trying to get her here to the hospital.He was able to catch her and she did not strike her head.Her husband didnot think patient accidentally took too many pain medicines. Concern of UTI, urine culture with insignificant growth.  Of note, patient has no IV access as she was noncooperative with central line which time to be placed yesterday.  However as patient does not appear to have any acute infection, we have agreed to not place a PICC line unless need for IV access becomes urgent.  Subjective: Patient appears very sleepy.  She just open eyes on command but does not talk.  Per nursing staff she was little bit more alert and able to converse ate some with her this morning.  She ate all of her breakfast and then went back to sleep.  Talked with her husband at bedside and according to him she is having declining health for the past many years since her strokes.  He works and unable to take care of her all the time.  Was also asking about getting a new splint/cast as the patient removed her previous cast for her left wrist fracture.  Assessment & Plan:   Principal Problem:   UTI (urinary tract infection) Active Problems:   HTN (hypertension)   Diabetes  mellitus without complication (HCC)   Hyperlipidemia   Acute metabolic encephalopathy   Seizure (HCC)   Elevated troponin   AKI (acute kidney injury) (HCC)   Fall   Iron deficiency anemia   Stroke (HCC)   Acute respiratory failure with hypoxia (HCC)   Distal radial fracture_left  Altered mental status/falls.  Per husband she is at her baseline.  Patient with declining health and cognitive impairment since her prior strokes. Husband works full-time and unable to provide 24/7 assistance as patient is becoming more and more dependent for all of her ADLs. -PT is recommending SNF placement.  TOC is working on it.  Abnormal UA.  Patient remained afebrile with no leukocytosis.  Urine culture with insignificant growth.  Provider over the weekend discussed with Dr. Algis LimingVandam of ID and he advised blood cultures as she has a prior history of urine cultures positive for Granulicatella which is a fastidious organism that can sometimes rarely be associated with endocarditis. -No need for antibiotics at this time. -Blood cultures pending.  Left distal radial fracture.  Secondary to mechanical fall. -Patient took off her splint.  There was significant edema of left hand and wrist.  Orthopedic was reconsulted at husband's request as patient will be going to a long-term facility. -Dr. Joice LoftsPoggi will see patient. -Continue Tylenol for pain management-we will try avoiding narcotics.  Essential hypertension.  Blood pressure elevated. -Increase home dose of lisinopril from 5 to 20 mg daily. -Continue home dose of metoprolol. -Continue to monitor  AKI.  Resolved with IV fluid.  Type 2 diabetes mellitus without complications. Patient was on  Metformin at home. -Continue Levemir 25 units with SSI.  Seizure disorder.  No acute concern. -Continue home Depakote -Continue seizure precautions  History of CVA. -Continue home dose of aspirin, Plavix and Lipitor.  Objective: Vitals:   03/20/20 2353 03/21/20 0404  03/21/20 0754 03/21/20 0958  BP: (!) 156/82 (!) 166/87 (!) 179/87 139/90  Pulse: 76 79 81 70  Resp: 20 20 16 17   Temp: 98.2 F (36.8 C) 97.6 F (36.4 C) 97.6 F (36.4 C) 97.6 F (36.4 C)  TempSrc: Oral Oral Oral   SpO2: 100% 100% 98% 100%  Weight:      Height:        Intake/Output Summary (Last 24 hours) at 03/21/2020 1404 Last data filed at 03/21/2020 1400 Gross per 24 hour  Intake 240 ml  Output 2600 ml  Net -2360 ml   Filed Weights   03/19/20 0435 03/20/20 0300  Weight: 110 kg 120.2 kg    Examination:  General exam: Morbidly obese lady, appears calm and comfortable  Respiratory system: Clear to auscultation. Respiratory effort normal. Cardiovascular system: S1 & S2 heard, RRR. No JVD, murmurs, Gastrointestinal system: Soft, nontender, nondistended, bowel sounds positive. Central nervous system: Appears lethargic and sleepy, not cooperative with exam. Extremities: Left hand edema extending up to wrist, no cyanosis, pulses intact and symmetrical. Psychiatry: Judgement and insight appear impaired.  DVT prophylaxis: Lovenox Code Status: Full Family Communication: Husband was updated at bedside Disposition Plan:  Status is: Inpatient  Remains inpatient appropriate because:Inpatient level of care appropriate due to severity of illness   Dispo: The patient is from: Home              Anticipated d/c is to: SNF              Anticipated d/c date is: 1 day              Patient currently is not medically stable to d/c.  PT is recommending SNF placement, TOC is working on it.  Consultants:   Orthopedic  Procedures:  Antimicrobials:   Data Reviewed: I have personally reviewed following labs and imaging studies  CBC: Recent Labs  Lab 03/19/20 0458 03/20/20 0507 03/21/20 0857  WBC 6.0 4.5 5.3  NEUTROABS 3.1  --   --   HGB 8.7* 8.2* 10.2*  HCT 27.3* 25.8* 31.6*  MCV 63.5* 62.9* 62.7*  PLT 189 179 205   Basic Metabolic Panel: Recent Labs  Lab 03/19/20 0458  03/20/20 0507 03/21/20 0857  NA 141 142 140  K 4.0 3.6 3.5  CL 105 105 103  CO2 26 28 26   GLUCOSE 151* 145* 170*  BUN 27* 18 10  CREATININE 1.36* 0.87 0.83  CALCIUM 8.8* 8.6* 9.1   GFR: Estimated Creatinine Clearance: 95.5 mL/min (by C-G formula based on SCr of 0.83 mg/dL). Liver Function Tests: Recent Labs  Lab 03/19/20 0458  AST 20  ALT 13  ALKPHOS 31*  BILITOT 0.9  PROT 7.1  ALBUMIN 3.5   No results for input(s): LIPASE, AMYLASE in the last 168 hours. No results for input(s): AMMONIA in the last 168 hours. Coagulation Profile: No results for input(s): INR, PROTIME in the last 168 hours. Cardiac Enzymes: No results for input(s): CKTOTAL, CKMB, CKMBINDEX, TROPONINI in the last 168 hours. BNP (last 3 results) No results for input(s): PROBNP in the last 8760 hours. HbA1C: No results for input(s): HGBA1C in the last 72 hours. CBG: Recent Labs  Lab 03/20/20 2003 03/20/20 2351 03/21/20 0357 03/21/20 05/22/20  03/21/20 1202  GLUCAP 160* 205* 172* 146* 209*   Lipid Profile: Recent Labs    03/20/20 0507  CHOL 119  HDL 35*  LDLCALC 65  TRIG 96  CHOLHDL 3.4   Thyroid Function Tests: No results for input(s): TSH, T4TOTAL, FREET4, T3FREE, THYROIDAB in the last 72 hours. Anemia Panel: No results for input(s): VITAMINB12, FOLATE, FERRITIN, TIBC, IRON, RETICCTPCT in the last 72 hours. Sepsis Labs: Recent Labs  Lab 03/19/20 0458  LATICACIDVEN 2.1*    Recent Results (from the past 240 hour(s))  Urine culture     Status: Abnormal   Collection Time: 03/19/20  5:07 AM   Specimen: Urine, Catheterized  Result Value Ref Range Status   Specimen Description   Final    URINE, CATHETERIZED Performed at [facility], [address] [place], [place] [address]    Special Requests   Final    NONE Performed at [facility], [address] [place], [place] [address]    Culture (A)  Final    <10,000 COLONIES/mL INSIGNIFICANT GROWTH Performed at [facility], [address] [place], [place] [address]    Report Status 03/20/2020 FINAL  Final  SARS Coronavirus 2 by RT PCR (hospital order, performed in [facility] hospital lab) Nasopharyngeal Nasopharyngeal Swab     Status: None   Collection Time: 03/19/20  5:20 AM   Specimen: Nasopharyngeal Swab  Result Value Ref Range Status   SARS Coronavirus 2 NEGATIVE NEGATIVE Final    Comment: (NOTE) SARS-CoV-2 target nucleic acids are NOT DETECTED.  The SARS-CoV-2 RNA is generally detectable in upper and lower respiratory specimens during the acute phase of infection. The lowest concentration of SARS-CoV-2 viral copies this assay can detect is 250 copies / mL. A negative result does not preclude SARS-CoV-2 infection and should not be used as the sole basis for treatment or other patient management decisions.  A negative result may occur with improper specimen collection / handling, submission of specimen other than nasopharyngeal swab, presence of viral mutation(s) within the areas targeted by this assay, and inadequate number of viral copies (<250 copies / mL). A negative result must be combined with clinical observations, patient history, and epidemiological information.  Fact Sheet for Patients:   BoilerBrush.com.cy  Fact Sheet for Healthcare Providers: https://pope.com/  This test is not yet approved or  cleared by the [place] FDA and has been authorized for detection and/or diagnosis of SARS-CoV-2 by FDA under an Emergency Use Authorization (EUA).  This EUA will remain in effect (meaning this test can be used) for the duration of the COVID-19 declaration under Section 564(b)(1) of the Act, 21 U.S.C. section 360bbb-3(b)(1), unless the authorization is terminated or revoked sooner.  Performed at [facility], [address], [place], [place] [address]      Radiology Studies: [facility] Chest [place] 1 View  Result Date:  03/20/2020 CLINICAL DATA:  Weakness, UTI EXAM: PORTABLE CHEST 1 VIEW COMPARISON:  03/19/2020 FINDINGS: Trachea is midline. Cardiomediastinal contours with persistent cardiac enlargement. Pulmonary vascular engorgement centrally. No consolidation.  No sign of pleural effusion. Visualized skeletal structures on limited assessment are unremarkable. IMPRESSION: Cardiomegaly with pulmonary vascular engorgement. Electronically Signed   By: Donzetta Kohut M.D.   On: 03/20/2020 12:36   [place] EKG SITE RITE  Result Date: 03/19/2020 If Site Rite image not attached, placement could not be confirmed due to current cardiac rhythm.   Scheduled Meds: . aspirin EC  81 mg Oral Daily  .  atorvastatin  40 mg Oral q1800  . Chlorhexidine Gluconate Cloth  6 each Topical Daily  . clopidogrel  75 mg Oral Daily  . darifenacin  7.5 mg Oral Daily  . divalproex  250 mg Oral Daily  . divalproex  500 mg Oral QHS  . enoxaparin (LOVENOX) injection  40 mg Subcutaneous Q12H  . insulin aspart  0-9 Units Subcutaneous Q4H  . insulin detemir  25 Units Subcutaneous Q2200  . lisinopril  5 mg Oral Daily  . metoprolol tartrate  25 mg Oral BID  . saccharomyces boulardii  250 mg Oral BID  . sodium chloride flush  10-40 mL Intracatheter Q12H   Continuous Infusions: . sodium chloride 75 mL/hr at 03/19/20 0818     LOS: 2 days   Time spent: 40 minutes.  Arnetha Courser, MD [facility]  If 7PM-7AM, please contact night-coverage [email or web address]  03/21/2020, 2:04 PM   This record has been created using Conservation officer, historic buildings. Errors have been sought and corrected,but may not always be located. Such creation errors do not reflect on the standard of care.

## 2020-03-21 NOTE — TOC Initial Note (Signed)
Transition of Care [facility]) - Initial/Assessment Note    Patient Details  Name: [name] MRN: [id number] Date of Birth: [birth year]  Transition of Care [facility]) CM/SW Contact:    Barrie Dunker, RN Phone Number: 03/21/2020, 9:14 AM  Clinical Narrative:                 Spoke with patient's husband, he would like for her to be placed in a long term SNF bed, She has regular Medicaid and would like help to get her to Long Term Medicaid I called [facility] and spoke with him they are willing to take a look and see if they can offer a long term bed. PASSr, FL2 and bed search completed, awaiting bed offers        Patient Goals and CMS Choice        Expected Discharge Plan and Services                                                Prior Living Arrangements/Services                       Activities of Daily Living      Permission Sought/Granted                  Emotional Assessment              Admission diagnosis:  UTI (urinary tract infection) [N39.0] Weakness [R53.1] AKI (acute kidney injury) (HCC) [N17.9] Fall, initial encounter [W19.XXXA] Urinary tract infection without hematuria, site unspecified [N39.0] Altered mental status, unspecified altered mental status type [R41.82] Sepsis, due to unspecified organism, unspecified whether acute organ dysfunction present [facility]) [A41.9] Patient Active Problem List   Diagnosis Date Noted   UTI (urinary tract infection) 03/19/2020   Distal radial fracture_left 03/19/2020   Stroke (HCC)    Acute respiratory failure with hypoxia (HCC)    Sepsis (HCC)    Pneumonia of left lower lobe due to infectious organism    Iron deficiency anemia    Shortness of breath    Lobar pneumonia (HCC)    Weakness 12/20/2019   Hemiparesis affecting left side as late effect of cerebrovascular accident (CVA) (HCC) 12/20/2019   Type 2 diabetes mellitus with hyperlipidemia (HCC) 12/20/2019   Seizure  disorder as sequela of cerebrovascular accident (HCC) 12/20/2019   Urinary incontinence 12/20/2019   Self-care deficit 12/20/2019   Fall    Lacunar stroke, acute (HCC) 07/24/2019   Seizure (HCC) 07/24/2019   Elevated troponin 07/24/2019   AKI (acute kidney injury) (HCC) 07/24/2019   Acute metabolic encephalopathy 07/23/2019   Cerebral infarction (HCC) 08/14/2016   Recurrent cerebrovascular accidents (CVAs) (HCC) 08/14/2016   Delirium due to another medical condition 08/13/2016   Urinary tract infection 08/13/2016   Right foot infection 05/11/2016   Foot abscess, right 05/11/2016   Anxiety and depression 12/02/2015   CVA (cerebral infarction) 11/27/2015   Epilepsy (HCC) 06/17/2015   Complicated migraine 06/14/2015   Headache 05/19/2015   Weakness of left side of body 05/19/2015   History of cardioembolic cerebrovascular accident (CVA) 05/19/2015   Morbid obesity with BMI of 40.0-44.9, adult (HCC) 03/19/2014   HTN (hypertension)    Diabetes mellitus without complication (HCC)    Hyperlipidemia    [name] (transient ischemic attack) 03/17/2014   PCP:  [name]:   [facility] [address]), [place] - [address] [place] ([address]) [place] [address] Phone: [phone] Fax: [phone]     Social Determinants of Health (SDOH) Interventions    Readmission Risk Interventions Readmission Risk Prevention Plan 07/25/2019  Post Dischage Appt Complete  Medication Screening Complete  Transportation Screening Complete  Some recent data might be hidden

## 2020-03-21 NOTE — Progress Notes (Signed)
Pt alert, agitated and uncooperative this shift. Was able to get some sleep during the night. Pt would wake up intermittently and able to fall back asleep.

## 2020-03-21 NOTE — Consult Note (Signed)
ORTHOPAEDIC CONSULTATION  REQUESTING PHYSICIAN: Arnetha Courser, MD  Chief Complaint:   Left wrist pain.  History of Present Illness: [name] is a 54 y.o. female who apparently fell and injured her wrist on 6/29.  She was seen by Dedra Skeens, PA-C, in the office the following day who applied a short arm cast to the wrist.  Apparently, the patient became confused and developed some weakness on 03/19/2020, so she was brought to the emergency room and subsequently admitted for acute metabolic encephalopathy, presumably due to a UTI.  Apparently, the patient removed her cast during her period of confusion, so I have been asked to evaluate the patient and reapply some form of immobilization.  The patient denies any reinjury to the wrist, but does note moderate pain in the wrist.  She denies any numbness or paresthesias to her fingers.  Past Medical History:  Diagnosis Date  . Allergic rhinitis   . Chickenpox   . Depression   . Diabetes (HCC)   . Epilepsy (HCC)   . Headache   . HTN (hypertension)   . Hyperlipidemia   . Seizures (HCC)   . Stroke (HCC)   . Urinary incontinence    Past Surgical History:  Procedure Laterality Date  . CESAREAN SECTION    . INCISION AND DRAINAGE Right 05/13/2016   Procedure: INCISION AND DRAINAGE;  Surgeon: Gwyneth Revels, DPM;  Location: [facility] ORS;  Service: Podiatry;  Laterality: Right;   Social History   Socioeconomic History  . Marital status: Married    Spouse name: Not on file  . Number of children: Not on file  . Years of education: Not on file  . Highest education level: Not on file  Occupational History  . Not on file  Tobacco Use  . Smoking status: Never Smoker  . Smokeless tobacco: Never Used  Substance and Sexual Activity  . Alcohol use: No    Alcohol/week: 0.0 standard drinks  . Drug use: No  . Sexual activity: Not on file  Other Topics Concern  . Not on file  Social History  Narrative  . Not on file   Social Determinants of Health   Financial Resource Strain:   . Difficulty of Paying Living Expenses:   Food Insecurity:   . Worried About Programme researcher, broadcasting/film/video in the Last Year:   . Barista in the Last Year:   Transportation Needs:   . Freight forwarder (Medical):   Marland Kitchen Lack of Transportation (Non-Medical):   Physical Activity:   . Days of Exercise per Week:   . Minutes of Exercise per Session:   Stress:   . Feeling of Stress :   Social Connections:   . Frequency of Communication with Friends and Family:   . Frequency of Social Gatherings with Friends and Family:   . Attends Religious Services:   . Active Member of Clubs or Organizations:   . Attends Banker Meetings:   Marland Kitchen Marital Status:    Family History  Problem Relation Age of Onset  . Hypertension Mother   . Diabetes Mellitus II Mother   . Hypertension Father   . Pancreatic cancer Father    No Known Allergies Prior to Admission medications   Medication Sig Start Date End Date Taking? Authorizing Provider  acetaminophen-codeine (TYLENOL #3) 300-30 MG tablet Take 1 tablet by mouth every 4 (four) hours as needed. 03/16/20  Yes [provider]  aspirin EC 81 MG EC tablet Take 1 tablet (81  mg total) by mouth daily. Swallow whole. 03/04/20  Yes Glade Lloyd, MD  atorvastatin (LIPITOR) 40 MG tablet Take 1 tablet (40 mg total) by mouth daily at 6 PM. 08/16/16  Yes Ghimire, Werner Lean, MD  clopidogrel (PLAVIX) 75 MG tablet Take 75 mg by mouth daily. 12/22/19  Yes [provider]  divalproex (DEPAKOTE) 250 MG DR tablet Take 250-500 mg by mouth 2 (two) times daily. Take one tablet (250 mg) by mouth every morning and two tablets (500 mg) at bedtime 07/13/19  Yes [provider]  insulin aspart (NOVOLOG FLEXPEN) 100 UNIT/ML FlexPen Inject 10 Units into the skin 3 (three) times daily with meals. Patient taking differently: Inject 15 Units into the skin 3 (three)  times daily with meals.  08/16/16  Yes Ghimire, Werner Lean, MD  insulin detemir (LEVEMIR FLEXPEN) 100 UNIT/ML FlexPen Inject 35 Units into the skin daily at 10 pm. 03/03/20  Yes Glade Lloyd, MD  lisinopril (ZESTRIL) 5 MG tablet Take 5 mg by mouth daily.   Yes [provider]  metFORMIN (GLUCOPHAGE-XR) 500 MG 24 hr tablet Take 500 mg by mouth 2 (two) times daily. 08/23/19  Yes [provider]  metoprolol tartrate (LOPRESSOR) 25 MG tablet Take 25 mg by mouth 2 (two) times a day.   Yes [provider]  saccharomyces boulardii (FLORASTOR) 250 MG capsule Take 250 mg by mouth 2 (two) times daily.   Yes [provider]  VESICARE 10 MG tablet Take 10 mg by mouth 2 (two) times daily. 05/26/19  Yes [provider]   DG Chest Port 1 View  Result Date: 03/20/2020 CLINICAL DATA:  Weakness, UTI EXAM: PORTABLE CHEST 1 VIEW COMPARISON:  03/19/2020 FINDINGS: Trachea is midline. Cardiomediastinal contours with persistent cardiac enlargement. Pulmonary vascular engorgement centrally. No consolidation.  No sign of pleural effusion. Visualized skeletal structures on limited assessment are unremarkable. IMPRESSION: Cardiomegaly with pulmonary vascular engorgement. Electronically Signed   By: Donzetta Kohut M.D.   On: 03/20/2020 12:36   [place] EKG SITE RITE  Result Date: 03/19/2020 If Site Rite image not attached, placement could not be confirmed due to current cardiac rhythm.   Positive ROS: All other systems have been reviewed and were otherwise negative with the exception of those mentioned in the HPI and as above.  Physical Exam: General:  Alert, no acute distress Psychiatric:  Patient is competent for consent with normal mood and affect   Cardiovascular:  No pedal edema Respiratory:  No wheezing, non-labored breathing GI:  Abdomen is soft and non-tender Skin:  No lesions in the area of chief complaint Neurologic:  Sensation intact distally Lymphatic:  No axillary or  cervical lymphadenopathy  Orthopedic Exam:  Orthopedic examination is limited to the left hand and forearm.  There is moderate swelling in the hand, and mild swelling in the wrist region, but no erythema, ecchymosis, abrasions, or other skin abnormalities are identified.  She has moderate tenderness to palpation over the distal radius region.  She also has moderate to severe pain with any attempted active or passive motion of the wrist.  She is able to flex and extend all digits to a limited extent due to pain and apprehension.  She is neurovascularly intact to all digits.  X-rays:  Recent x-rays of the left wrist are available for review and have been reviewed by myself.  These films demonstrate a minimally impacted essentially nondisplaced distal radius fracture with intra-articular extension.  No significant degenerative changes are identified, and no lytic lesions  are noted.  Assessment: Essentially nondisplaced impacted left distal radius fracture.  Plan: The treatment options have been discussed with the patient.  Given her x-ray findings, I feel that continued nonsurgical management is appropriate at this time.  Given that she has as much swelling in her hand and wrist as she does, I feel that it is worthwhile to apply a Velcro wrist immobilizer at this time to help with pain control and to limit her wrist motion while the swelling subsides.  She may follow-up with Dr. Allena Katz or Dedra Skeens, PA-C, his physician assistant in 5 to 7 days for repeat x-ray of the wrist and probable reapplication of the short arm cast.  Thank you for asking me to participate in the care of this most unfortunate woman.  Please let me know if there is any further that I can do.   Maryagnes Amos, MD  Beeper #:  [phone]  03/21/2020 3:20 PM

## 2020-03-21 NOTE — Progress Notes (Signed)
Ch attempted visit. Pt was fast asleep. Ch checked with RN inside the room before leaving.

## 2020-03-21 NOTE — Progress Notes (Signed)
Pts BP elevated, morning BP meds given. MD made aware  Update after meds 139/90

## 2020-03-22 LAB — GLUCOSE, CAPILLARY
Glucose-Capillary: 158 mg/dL — ABNORMAL HIGH (ref 70–99)
Glucose-Capillary: 159 mg/dL — ABNORMAL HIGH (ref 70–99)
Glucose-Capillary: 165 mg/dL — ABNORMAL HIGH (ref 70–99)
Glucose-Capillary: 217 mg/dL — ABNORMAL HIGH (ref 70–99)
Glucose-Capillary: 255 mg/dL — ABNORMAL HIGH (ref 70–99)
Glucose-Capillary: 315 mg/dL — ABNORMAL HIGH (ref 70–99)
Glucose-Capillary: 328 mg/dL — ABNORMAL HIGH (ref 70–99)

## 2020-03-22 MED ORDER — LISINOPRIL 20 MG PO TABS
40.0000 mg | ORAL_TABLET | Freq: Every day | ORAL | Status: DC
  Administered 2020-03-23 – 2020-03-28 (×6): 40 mg via ORAL
  Filled 2020-03-22 (×6): qty 2

## 2020-03-22 NOTE — TOC Progression Note (Addendum)
Transition of Care [facility]) - Progression Note    Patient Details  Name: [name] MRN: [id number] Date of Birth: [birth year]  Transition of Care [facility]) CM/SW Contact  Barrie Dunker, RN Phone Number: 03/22/2020, 9:19 AM  Clinical Narrative:     Spoke with Tammy at the [facility] and confirmed the bed offer was for a long term bed, I explained that she has regular Medicaid that needs to be transitioned to long term medicaid, She stated that they will run the Medicaid and then call me back, I sent the bed search to [facility] [place] and gave Benson with [facility] a heads up that I sent to them for a bed request. There are no long term beds at [facility] at this time.  Sent to [facility] for Long term bed request to help with finding a long term facility, will review with the spouse once bed offers are onbtained      Expected Discharge Plan and Services                                                 Social Determinants of Health (SDOH) Interventions    Readmission Risk Interventions Readmission Risk Prevention Plan 07/25/2019  Post Dischage Appt Complete  Medication Screening Complete  Transportation Screening Complete  Some recent data might be hidden

## 2020-03-22 NOTE — Progress Notes (Addendum)
Shift Summary:  Patient had no acute events overnight. Patient SBP up to 175 this shift.  Patient remains without IV access.  Patient FSBS 150-200s during this shift.  Sitter at bedside. Several attempts to get out of bed by patient. Splint remains on left wrist.  No concern of pain at this time. Patient medicated per MAR.  Patient appears to aspirate off of her own saliva when laying back less than 30 degrees and when taking sips of regular water.  Patient's meds crushed and given with applesauce and nectar thickened fluids tolerated very well., neither did patient have to lear her throat with nectar thickened liquids. Patient would benefit from SLP study as patient tolerates modified liquids and foods better. Will continue to monitor patient.

## 2020-03-22 NOTE — Plan of Care (Signed)
  Problem: Fluid Volume: Goal: Hemodynamic stability will improve Outcome: Progressing   Problem: Clinical Measurements: Goal: Diagnostic test results will improve Outcome: Progressing Goal: Signs and symptoms of infection will decrease Outcome: Progressing   Problem: Education: Goal: Knowledge of General Education information will improve Description: Including pain rating scale, medication(s)/side effects and non-pharmacologic comfort measures Outcome: Progressing   Problem: Health Behavior/Discharge Planning: Goal: Ability to manage health-related needs will improve Outcome: Progressing   Problem: Clinical Measurements: Goal: Ability to maintain clinical measurements within normal limits will improve Outcome: Progressing Goal: Will remain free from infection Outcome: Progressing Goal: Diagnostic test results will improve Outcome: Progressing Goal: Respiratory complications will improve Outcome: Progressing Goal: Cardiovascular complication will be avoided Outcome: Progressing   Problem: Activity: Goal: Risk for activity intolerance will decrease Outcome: Progressing   Problem: Nutrition: Goal: Adequate nutrition will be maintained Outcome: Progressing   Problem: Coping: Goal: Level of anxiety will decrease Outcome: Progressing   Problem: Elimination: Goal: Will not experience complications related to bowel motility Outcome: Progressing Goal: Will not experience complications related to urinary retention Outcome: Progressing   Problem: Pain Managment: Goal: General experience of comfort will improve Outcome: Progressing   Problem: Safety: Goal: Ability to remain free from injury will improve Outcome: Progressing   Problem: Skin Integrity: Goal: Risk for impaired skin integrity will decrease Outcome: Progressing   

## 2020-03-22 NOTE — Progress Notes (Signed)
PROGRESS NOTE    [name]  BDZ:[id number] DOB: Aug 07, 1966 DOA: 03/19/2020 PCP: [facility]   Brief Narrative:  [name] a 54 y.o.femalewith medical history significant ofhypertension, hyperlipidemia, diabetes mellitus, stroke, [name], depression, anxiety, seizure,who presents with AMS and fall.   Per report, pt recently had fall and wasseen in ED 6/29, and found to have left distal radius fracture.Wrist splint was placed.Patientwasdischarged home with prescriptions for a low-dose oftylenol with codeine. Patient is to follow up withorthopedics.Per EDP, pt was noted to be confused and hasgeneralized weakness. Patient took offwrist splint.She felltwice due to her legs bucklingwhen her husbandwas trying to get her here to the hospital.He was able to catch her and she did not strike her head.Her husband didnot think patient accidentally took too many pain medicines. Concern of UTI, urine culture with insignificant growth.  Of note, patient has no IV access as she was noncooperative with central line which time to be placed yesterday.  However as patient does not appear to have any acute infection, we have agreed to not place a PICC line unless need for IV access becomes urgent.  Subjective: Patient remained very lethargic.  Unable to participate with any conversation or exam.  Per nursing concern she seems chocking last night when her head was less than 30 degree on her own secretions. Nurse was able to wake her up in the morning to feed her.  She did took some of her breakfast and medications.  Assessment & Plan:   Principal Problem:   UTI (urinary tract infection) Active Problems:   HTN (hypertension)   Diabetes mellitus without complication (HCC)   Hyperlipidemia   Acute metabolic encephalopathy   Seizure (HCC)   Elevated troponin   AKI (acute kidney injury) (HCC)   Fall   Iron deficiency anemia   Stroke (HCC)   Acute respiratory  failure with hypoxia (HCC)   Distal radial fracture_left  Altered mental status/falls.  Per husband she is at her baseline.  Patient with declining health and cognitive impairment since her prior strokes. Husband works full-time and unable to provide 24/7 assistance as patient is becoming more and more dependent for all of her ADLs. -PT is recommending SNF placement.  TOC is working on it. -Patient needs long-term care facility-had a bed offer, working on English as a second language teacher. -Due to persistent declining health palliative care was consulted as patient remains full code with very poor functional status.  Abnormal UA.  Patient remained afebrile with no leukocytosis.  Urine culture with insignificant growth.  Provider over the weekend discussed with Dr. Algis Liming of ID and he advised blood cultures as she has a prior history of urine cultures positive for Granulicatella which is a fastidious organism that can sometimes rarely be associated with endocarditis. -No need for antibiotics at this time. -Blood cultures negative.  Nursing concern about aspiration.  Formal swallow evaluation was obtained, patient with oropharyngeal phase dysphagia and they were recommending dysphagia 3 diet with nectar thick consistency liquids. -Keep head elevated at 30 degree.  Left distal radial fracture.  Secondary to mechanical fall. -Patient took off her splint.  There was significant edema of left hand and wrist.  Orthopedic was reconsulted at husband's request as patient will be going to a long-term facility. -Dr. Joice Lofts saw the patient and advised soft immobilizer due to significant edema and an outpatient follow-up in 7 to 10 days for a possible replacement of short arm cast. -Continue Tylenol for pain management-we will try avoiding narcotics.  Essential  hypertension.  Blood pressure remained elevated despite increasing the dose of lisinopril yesterday. -Increase lisinopril to 40 mg daily. -Continue home dose  of metoprolol. -Continue to monitor  AKI.  Resolved with IV fluid.  Type 2 diabetes mellitus without complications. Patient was on Metformin at home.  CBG mildly elevated. -Continue Levemir 25 units with SSI.  Seizure disorder.  No acute concern. -Continue home Depakote -Continue seizure precautions  History of CVA. -Continue home dose of aspirin, Plavix and Lipitor.  Objective: Vitals:   03/21/20 1651 03/21/20 1949 03/22/20 0007 03/22/20 0835  BP: (!) 168/70 (!) 163/81 (!) 175/90 (!) 129/51  Pulse: 77 92 91 84  Resp: 18 16 17 18   Temp:  98.6 F (37 C) 98.5 F (36.9 C) 98.7 F (37.1 C)  TempSrc:  Oral Oral Oral  SpO2: 99% 99% 100% 100%  Weight:      Height:        Intake/Output Summary (Last 24 hours) at 03/22/2020 1456 Last data filed at 03/22/2020 1438 Gross per 24 hour  Intake --  Output 500 ml  Net -500 ml   Filed Weights   03/19/20 0435 03/20/20 0300  Weight: 110 kg 120.2 kg    Examination:  General exam: Morbidly obese lady, appears calm and comfortable  Respiratory system: Clear to auscultation. Respiratory effort normal. Cardiovascular system: S1 & S2 heard, RRR. No JVD, murmurs, Gastrointestinal system: Soft, nontender, nondistended, bowel sounds positive. Central nervous system: Appears lethargic and sleepy, not cooperative with exam. Extremities: Left hand with soft immobilizer. Psychiatry: Judgement and insight appear impaired.  DVT prophylaxis: Lovenox Code Status: Full Family Communication: Husband was updated on phone. Disposition Plan:  Status is: Inpatient  Remains inpatient appropriate because:Inpatient level of care appropriate due to severity of illness   Dispo: The patient is from: Home              Anticipated d/c is to: SNF              Anticipated d/c date is: 1 day              Patient currently is not medically stable to d/c.  PT is recommending SNF placement, patient will require long-term facility, had a bed offer, waiting for  insurance authorization.  Consultants:   Orthopedic  Procedures:  Antimicrobials:   Data Reviewed: I have personally reviewed following labs and imaging studies  CBC: Recent Labs  Lab 03/19/20 0458 03/20/20 0507 03/21/20 0857  WBC 6.0 4.5 5.3  NEUTROABS 3.1  --   --   HGB 8.7* 8.2* 10.2*  HCT 27.3* 25.8* 31.6*  MCV 63.5* 62.9* 62.7*  PLT 189 179 205   Basic Metabolic Panel: Recent Labs  Lab 03/19/20 0458 03/20/20 0507 03/21/20 0857  NA 141 142 140  K 4.0 3.6 3.5  CL 105 105 103  CO2 26 28 26   GLUCOSE 151* 145* 170*  BUN 27* 18 10  CREATININE 1.36* 0.87 0.83  CALCIUM 8.8* 8.6* 9.1   GFR: Estimated Creatinine Clearance: 95.5 mL/min (by C-G formula based on SCr of 0.83 mg/dL). Liver Function Tests: Recent Labs  Lab 03/19/20 0458  AST 20  ALT 13  ALKPHOS 31*  BILITOT 0.9  PROT 7.1  ALBUMIN 3.5   No results for input(s): LIPASE, AMYLASE in the last 168 hours. No results for input(s): AMMONIA in the last 168 hours. Coagulation Profile: No results for input(s): INR, PROTIME in the last 168 hours. Cardiac Enzymes: No results for input(s): CKTOTAL, CKMB,  CKMBINDEX, TROPONINI in the last 168 hours. BNP (last 3 results) No results for input(s): PROBNP in the last 8760 hours. HbA1C: No results for input(s): HGBA1C in the last 72 hours. CBG: Recent Labs  Lab 03/21/20 1951 03/22/20 0003 03/22/20 0408 03/22/20 0738 03/22/20 1156  GLUCAP 214* 217* 159* 165* 158*   Lipid Profile: Recent Labs    03/20/20 0507  CHOL 119  HDL 35*  LDLCALC 65  TRIG 96  CHOLHDL 3.4   Thyroid Function Tests: No results for input(s): TSH, T4TOTAL, FREET4, T3FREE, THYROIDAB in the last 72 hours. Anemia Panel: No results for input(s): VITAMINB12, FOLATE, FERRITIN, TIBC, IRON, RETICCTPCT in the last 72 hours. Sepsis Labs: Recent Labs  Lab 03/19/20 0458  LATICACIDVEN 2.1*    Recent Results (from the past 240 hour(s))  Urine culture     Status: Abnormal   Collection  Time: 03/19/20  5:07 AM   Specimen: Urine, Catheterized  Result Value Ref Range Status   Specimen Description   Final    URINE, CATHETERIZED Performed at [facility], [address] [place], [place] [address]    Special Requests   Final    NONE Performed at [facility], [address] [place], [place] [address]    Culture (A)  Final    <10,000 COLONIES/mL INSIGNIFICANT GROWTH Performed at [facility], [address] [place], [place] [address]    Report Status 03/20/2020 FINAL  Final  SARS Coronavirus 2 by RT PCR (hospital order, performed in [facility] hospital lab) Nasopharyngeal Nasopharyngeal Swab     Status: None   Collection Time: 03/19/20  5:20 AM   Specimen: Nasopharyngeal Swab  Result Value Ref Range Status   SARS Coronavirus 2 NEGATIVE NEGATIVE Final    Comment: (NOTE) SARS-CoV-2 target nucleic acids are NOT DETECTED.  The SARS-CoV-2 RNA is generally detectable in upper and lower respiratory specimens during the acute phase of infection. The lowest concentration of SARS-CoV-2 viral copies this assay can detect is 250 copies / mL. A negative result does not preclude SARS-CoV-2 infection and should not be used as the sole basis for treatment or other patient management decisions.  A negative result may occur with improper specimen collection / handling, submission of specimen other than nasopharyngeal swab, presence of viral mutation(s) within the areas targeted by this assay, and inadequate number of viral copies (<250 copies / mL). A negative result must be combined with clinical observations, patient history, and epidemiological information.  Fact Sheet for Patients:   BoilerBrush.com.cyhttps://www.fda.gov/media/136312/download  Fact Sheet for Healthcare Providers: https://pope.com/https://www.fda.gov/media/136313/download  This test is not yet approved or  cleared by the [place] FDA and has been authorized for detection and/or diagnosis of SARS-CoV-2 by FDA  under an Emergency Use Authorization (EUA).  This EUA will remain in effect (meaning this test can be used) for the duration of the COVID-19 declaration under Section 564(b)(1) of the Act, 21 U.S.C. section 360bbb-3(b)(1), unless the authorization is terminated or revoked sooner.  Performed at [facility], [address] [place], [place] [address]   Culture, blood (Routine X 2) w Reflex to ID Panel     Status: None (Preliminary result)   Collection Time: 03/21/20  8:57 AM   Specimen: BLOOD RIGHT HAND  Result Value Ref Range Status   Specimen Description BLOOD RIGHT HAND  Final   Special Requests   Final    BOTTLES DRAWN AEROBIC AND ANAEROBIC Blood Culture adequate volume   Culture   Final  NO GROWTH < 24 HOURS Performed at [facility], [address] [place], [place] [address]    Report Status PENDING  Incomplete  Culture, blood (Routine X 2) w Reflex to ID Panel     Status: None (Preliminary result)   Collection Time: 03/21/20 10:18 AM   Specimen: BLOOD  Result Value Ref Range Status   Specimen Description BLOOD BLOOD RIGHT HAND  Final   Special Requests   Final    BOTTLES DRAWN AEROBIC ONLY Blood Culture results may not be optimal due to an inadequate volume of blood received in culture bottles   Culture   Final    NO GROWTH < 24 HOURS Performed at [facility], [address], [place], [place] [address]    Report Status PENDING  Incomplete     Radiology Studies: No results found.  Scheduled Meds: . aspirin EC  81 mg Oral Daily  . atorvastatin  40 mg Oral q1800  . Chlorhexidine Gluconate Cloth  6 each Topical Daily  . clopidogrel  75 mg Oral Daily  . darifenacin  7.5 mg Oral Daily  . divalproex  250 mg Oral Daily  . divalproex  500 mg Oral QHS  . enoxaparin (LOVENOX) injection  40 mg Subcutaneous Q12H  . insulin aspart  0-9 Units Subcutaneous Q4H  . insulin detemir  25 Units Subcutaneous Q2200  . lisinopril  20 mg Oral Daily  .  metoprolol tartrate  25 mg Oral BID  . saccharomyces boulardii  250 mg Oral BID  . sodium chloride flush  10-40 mL Intracatheter Q12H   Continuous Infusions:    LOS: 3 days   Time spent: 40 minutes.  Arnetha Courser, MD [facility]  If 7PM-7AM, please contact night-coverage [email or web address]  03/22/2020, 2:56 PM   This record has been created using Conservation officer, historic buildings. Errors have been sought and corrected,but may not always be located. Such creation errors do not reflect on the standard of care.

## 2020-03-22 NOTE — Evaluation (Addendum)
Clinical/Bedside Swallow Evaluation Patient Details  Name: [name] MRN: [id number] Date of Birth: Mar 18, 1966  Today's Date: 03/22/2020 Time: SLP Start Time (ACUTE ONLY): 1140 SLP Stop Time (ACUTE ONLY): 1240 SLP Time Calculation (min) (ACUTE ONLY): 60 min  Past Medical History:  Past Medical History:  Diagnosis Date   Allergic rhinitis    Chickenpox    Depression    Diabetes (HCC)    Epilepsy (HCC)    Headache    HTN (hypertension)    Hyperlipidemia    Seizures (HCC)    Stroke [facility])    Urinary incontinence    Past Surgical History:  Past Surgical History:  Procedure Laterality Date   CESAREAN SECTION     INCISION AND DRAINAGE Right 05/13/2016   Procedure: INCISION AND DRAINAGE;  Surgeon: Gwyneth Revels, DPM;  Location: [facility] ORS;  Service: Podiatry;  Laterality: Right;   HPI:  Pt is a 54 y.o. female with past history of Multiple medical issues including Morbid Obesity, multiple Strokes (03/2014;  11/2015; 07/2019), history of hypertension, diabetes mellitus, hyperlipidemia, depression, epilepsy, hyperlipidemia. Pt with previous ST cognitive linguistic evaluation 08/14/2016 which identified moderate cognitive deficits in the areas of problem solving, task initiation, awareness and attention.  Pt's husband stated she requires consistent assistance w/ all ADLs at home.  Husband endorsed last admit that pt could get choked at home when she "ate too much", too fast.  He also endorsed that she "eats while sleeping in bed".  Per recent chart notes, pt recently had fall and was seen in ED 6/29, and found to have left distal radius fracture. Wrist splint was placed. Patient was discharged home.  Pt was admitted this admit to the ED w/ dx of UTI.  During this admission, pt has exhibited min confusion, agitated and uncooperative then drowsiness in the past 1-2 days.  Per MD note, there is concern of pt appearing to "aspirate off of her own saliva when laying back less than 30 degrees and  when taking sips of regular water".  Patient's meds have been Crushed since w/ applesauce and nectar thickened fluids given which MD noted have been tolerated "very well".  (this was the same recommendation last admit ~3 weeks ago).     Assessment / Plan / Recommendation Clinical Impression  Pt appears to present w/ oropharyngeal phase dysphagia, and a similar presentation as at previous assessment ~3 weeks ago. Pt admitted again to this Hospital w/ dx of UTI and encephalopathy; pt has a h/o multiple CVAs w/ Cognitive-linguistic decline per chart notes/assessments prior. Any decline in Neurological status or Cognition can increase risk for Dysphagia thus increase risk for Aspiration w/ oral intake. This was noted in the previous admit/assessment, and a Dysphagia diet w/ thickened liquids was recommended then w/ aspiration precautions. With such a quick readmission and weakened status, a concervative approach is being taken. Per current MD notes, pt has been noted to exhibit overt s/s of aspiration when drinking Thin liquids also. During this evaluation, pt consumed trials of Nectar liquids via cup, then straw, and purees, and minced/softened solids w/ No overt, clinical s/s of aspiration noted noted -- vocal quality was clear when assessed; no decline in respiratory effort during/post trials. Pharyngeal phase swallowing appeared timely and laryngeal excursion grossly wfl upon palpation. Oral phase c/b adequate bolus acceptance and management of Nectar liquids and purees; min increased oral phase time for mastication and oral clearing w/ increased textured foods -- noted more of a munching pattern w/ smacking during mastication of  softened solids vs rotary chewing. Given Time and alternating w/ moist food/puree, pt was able to clear adequately b/t trials of solids. Pt is missing some Dentition. Feeding Support given; verbal cues and model as pt was also noted to drink somewhat Impulsively w/ the Nectar liquids  via straw. D/t pt's overall presentation and the focus to reduce risk of aspiration w/ oral intake, recommend a modified diet of Dysphagia level 2 (MINCED foods w/ gravies) w/ Nectar consistency liquids; general aspiration and Reflux precautions. Assistance at meals for support and monitoring safety; follow through w/ precautions. Recommend Pills given in Puree for safer swallowing d/t Cognitive decline. Pt should continue ongoing ST services at next venue of care for education and toleration of diet; trials to upgrade diet further IF appropriate. Education re: precautions given to [employer] and NSG; NSG/MD updated. Precautions posted in room. SLP Visit Diagnosis: Dysphagia, oropharyngeal phase (R13.12)    Aspiration Risk  Mild aspiration risk;Moderate aspiration risk;Risk for inadequate nutrition/hydration    Diet Recommendation  Dysphagia level 2 (MINCED foods w/ Gravies to moisten); NECTAR consistency liquids. General aspiration precautions, Support w/ feeding, and Supervision during meals for follow through w/ precautions. Reduce distractions at meals. Reflux precautions.   Medication Administration: Whole meds with puree (vs Crushed in meds for ease of swallowing; per NSG)    Other  Recommendations Recommended Consults:  (Dietician f/u) Oral Care Recommendations: Oral care BID;Oral care before and after PO;Staff/trained caregiver to provide oral care Other Recommendations: Order thickener from pharmacy;Prohibited food (jello, ice cream, thin soups);Remove water pitcher;Have oral suction available   Follow up Recommendations Skilled Nursing facility (TBD)      Frequency and Duration  (n/a)   (n/a)       Prognosis Prognosis for Safe Diet Advancement: Fair (w/ modified diet) Barriers to Reach Goals: Cognitive deficits;Time post onset;Severity of deficits (Dysphagia)      Swallow Study   General Date of Onset: 03/19/20 HPI: Pt is a 54 y.o. female with past history of Multiple medical  issues including Morbid Obesity, multiple Strokes (03/2014;  11/2015; 07/2019), history of hypertension, diabetes mellitus, hyperlipidemia, depression, epilepsy, hyperlipidemia. Pt with previous ST cognitive linguistic evaluation 08/14/2016 which identified moderate cognitive deficits in the areas of problem solving, task initiation, awareness and attention.  Pt's husband stated she requires consistent assistance w/ all ADLs at home.  Husband endorsed last admit that pt could get choked at home when she "ate too much", too fast.  He also endorsed that she "eats while sleeping in bed".  Per recent chart notes, pt recently had fall and was seen in ED 6/29, and found to have left distal radius fracture. Wrist splint was placed. Patient was discharged home.  Pt was admitted this admit to the ED w/ dx of UTI.  During this admission, pt has exhibited min confusion, agitated and uncooperative then drowsiness in the past 1-2 days.  Per MD note, there is concern of pt appearing to "aspirate off of her own saliva when laying back less than 30 degrees and when taking sips of regular water".  Patient's meds have been Crushed since w/ applesauce and nectar thickened fluids given which MD noted have been tolerated "very well".  (this was the same recommendation last admit ~3 weeks ago).   Type of Study: Bedside Swallow Evaluation Previous Swallow Assessment: 03/02/2020; rec'd dysphagia level 2 w/ Nectar liquids at that d/c then Diet Prior to this Study: Dysphagia 2 (chopped);Nectar-thick liquids (initially a regular diet at this admit) Temperature Spikes  Noted: No (wbc 5.3) Respiratory Status: Room air History of Recent Intubation: No Behavior/Cognition: Alert;Cooperative;Pleasant mood;Distractible;Requires cueing (mostly nonverbal though) Oral Cavity Assessment: Within Functional Limits Oral Care Completed by SLP: Recent completion by staff Oral Cavity - Dentition: Poor condition;Missing dentition Vision: Functional for  self-feeding Self-Feeding Abilities: Able to feed self;Needs assist;Needs set up Patient Positioning: Upright in bed (needed positioning support) Baseline Vocal Quality: Low vocal intensity (few words) Volitional Cough: Cognitively unable to elicit Volitional Swallow: Unable to elicit    Oral/Motor/Sensory Function Overall Oral Motor/Sensory Function: Generalized oral weakness (munching behavior when chewing) Facial ROM: Within Functional Limits Facial Symmetry: Within Functional Limits Lingual ROM: Within Functional Limits Lingual Symmetry: Within Functional Limits Mandible: Within Functional Limits   Ice Chips Ice chips: Not tested   Thin Liquid Thin Liquid: Not tested Other Comments: MD and NSG reported overt s/s of aspiration being noted w/ prior diet that included thin liquids as drink consistency    Nectar Thick Nectar Thick Liquid: Within functional limits (grossly) Presentation: Cup;Self Fed;Straw (4 ozs, then 4 straw trials)   Honey Thick Honey Thick Liquid: Not tested   Puree Puree: Within functional limits (grossly) Presentation: Self Fed;Spoon (supported; ~2+ ozs)   Solid     Solid: Impaired (mech soft, softened) Presentation: Spoon (fed; 5 trials) Oral Phase Impairments: Impaired mastication (increased munching/smacking behavior) Oral Phase Functional Implications:  (min increased Time) Pharyngeal Phase Impairments:  (none)       Jerilynn Som, MS, CCC-SLP Mattheu Brodersen 03/22/2020,2:47 PM

## 2020-03-22 NOTE — TOC Progression Note (Addendum)
Transition of Care [facility]) - Progression Note    Patient Details  Name: [name] MRN: [id number] Date of Birth: [birth year]  Transition of [facility]) CM/SW Contact  Barrie Dunker, RN Phone Number: 03/22/2020, 2:05 PM  Clinical Narrative:    Received call from Tammy at the [facility] and they stated that they will be able to accept the patient for long term care, I called the patient's husband [name] and gave the bed offer, He accepted the bed offer, they have to start insurance auth, They will start the process today, the husband will go tomorrow and get the paperwork for the bed started        Expected Discharge Plan and Services                                                 Social Determinants of Health (SDOH) Interventions    Readmission Risk Interventions Readmission Risk Prevention Plan 07/25/2019  Post Dischage Appt Complete  Medication Screening Complete  Transportation Screening Complete  Some recent data might be hidden

## 2020-03-23 DIAGNOSIS — Z515 Encounter for palliative care: Secondary | ICD-10-CM

## 2020-03-23 DIAGNOSIS — Z7189 Other specified counseling: Secondary | ICD-10-CM

## 2020-03-23 LAB — BASIC METABOLIC PANEL
Anion gap: 10 (ref 5–15)
BUN: 24 mg/dL — ABNORMAL HIGH (ref 6–20)
CO2: 26 mmol/L (ref 22–32)
Calcium: 8.7 mg/dL — ABNORMAL LOW (ref 8.9–10.3)
Chloride: 104 mmol/L (ref 98–111)
Creatinine, Ser: 1.23 mg/dL — ABNORMAL HIGH (ref 0.44–1.00)
GFR calc Af Amer: 58 mL/min — ABNORMAL LOW (ref >60)
GFR calc non Af Amer: 50 mL/min — ABNORMAL LOW (ref >60)
Glucose, Bld: 202 mg/dL — ABNORMAL HIGH (ref 70–99)
Potassium: 3.7 mmol/L (ref 3.5–5.1)
Sodium: 140 mmol/L (ref 135–145)

## 2020-03-23 LAB — GLUCOSE, CAPILLARY
Glucose-Capillary: 275 mg/dL — ABNORMAL HIGH (ref 70–99)
Glucose-Capillary: 213 mg/dL — ABNORMAL HIGH (ref 70–99)
Glucose-Capillary: 202 mg/dL — ABNORMAL HIGH (ref 70–99)
Glucose-Capillary: 179 mg/dL — ABNORMAL HIGH (ref 70–99)
Glucose-Capillary: 161 mg/dL — ABNORMAL HIGH (ref 70–99)
Glucose-Capillary: 252 mg/dL — ABNORMAL HIGH (ref 70–99)

## 2020-03-23 LAB — CBC
HCT: 27.7 % — ABNORMAL LOW (ref 36.0–46.0)
Hemoglobin: 9 g/dL — ABNORMAL LOW (ref 12.0–15.0)
MCH: 20.1 pg — ABNORMAL LOW (ref 26.0–34.0)
MCHC: 32.5 g/dL (ref 30.0–36.0)
MCV: 62 fL — ABNORMAL LOW (ref 80.0–100.0)
Platelets: 180 10*3/uL (ref 150–400)
RBC: 4.47 MIL/uL (ref 3.87–5.11)
RDW: 20.5 % — ABNORMAL HIGH (ref 11.5–15.5)
WBC: 6.1 10*3/uL (ref 4.0–10.5)
nRBC: 0 % (ref 0.0–0.2)

## 2020-03-23 NOTE — Progress Notes (Signed)
PROGRESS NOTE    [name]  CHE:[id number] DOB: May 22, 1966 DOA: 03/19/2020 PCP: [facility]    Brief Narrative:  Jari Carollo a 54 y.o.femalewith medical history significant ofhypertension, hyperlipidemia, diabetes mellitus, stroke, [name], depression, anxiety, seizure,who presents with AMS and fall.   Per report, pt recently had fall and wasseen in ED 6/29, and found to have left distal radius fracture.Wrist splint was placed.Patientwasdischarged home with prescriptions for a low-dose oftylenol with codeine. Patient is to follow up withorthopedics.Per EDP, pt was noted to be confused and hasgeneralized weakness. Patient took offwrist splint.She felltwice due to her legs bucklingwhen her husbandwas trying to get her here to the hospital.He was able to catch her and she did not strike her head.Her husband didnot think patient accidentally took too many pain medicines. Concern of UTI, urine culture with insignificant growth.  Of note, patient has no IV access as she was noncooperative with central line which time to be placed yesterday. However as patient does not appear to have any acute infection, we have agreed to not place a PICC line unless need for IV access becomes urgent.    Consultants:   Palliative orthopedics  Procedures:   Antimicrobials:       Subjective: Patient is alert and oriented x3.  Denies any chest pain, shortness of breath, or any other symptoms.  Objective: Vitals:   03/22/20 0835 03/22/20 1959 03/23/20 0806 03/23/20 1516  BP: (!) 129/51 115/73 121/90 (!) 154/122  Pulse: 84 (!) 103 90 91  Resp: 18  20 18   Temp: 98.7 F (37.1 C) 98.9 F (37.2 C) (!) 97.2 F (36.2 C) 97.9 F (36.6 C)  TempSrc: Oral Oral Axillary Oral  SpO2: 100% 97% 97% 100%  Weight:      Height:        Intake/Output Summary (Last 24 hours) at 03/23/2020 1643 Last data filed at 03/23/2020 0930 Gross per 24 hour  Intake 0 ml  Output 0 ml   Net 0 ml   Filed Weights   03/19/20 0435 03/20/20 0300  Weight: 110 kg 120.2 kg    Examination:  General exam: Appears calm and comfortable, NAD Respiratory system: Clear to auscultation. Respiratory effort normal. Cardiovascular system: S1 & S2 heard, RRR. No JVD, murmurs, rubs, gallops or clicks.  Gastrointestinal system: Abdomen is nondistended, soft and nontender. Normal bowel sounds heard. Central nervous system: Alert and oriented x3.  Grossly intact Extremities: No edema Skin: Warm dry Psychiatry:  Mood & affect appropriate in current setting.     Data Reviewed: I have personally reviewed following labs and imaging studies  CBC: Recent Labs  Lab 03/19/20 0458 03/20/20 0507 03/21/20 0857 03/23/20 0737  WBC 6.0 4.5 5.3 6.1  NEUTROABS 3.1  --   --   --   HGB 8.7* 8.2* 10.2* 9.0*  HCT 27.3* 25.8* 31.6* 27.7*  MCV 63.5* 62.9* 62.7* 62.0*  PLT 189 179 205 180   Basic Metabolic Panel: Recent Labs  Lab 03/19/20 0458 03/20/20 0507 03/21/20 0857 03/23/20 0737  NA 141 142 140 140  K 4.0 3.6 3.5 3.7  CL 105 105 103 104  CO2 26 28 26 26   GLUCOSE 151* 145* 170* 202*  BUN 27* 18 10 24*  CREATININE 1.36* 0.87 0.83 1.23*  CALCIUM 8.8* 8.6* 9.1 8.7*   GFR: Estimated Creatinine Clearance: 64.5 mL/min (A) (by C-G formula based on SCr of 1.23 mg/dL (H)). Liver Function Tests: Recent Labs  Lab 03/19/20 0458  AST 20  ALT  13  ALKPHOS 31*  BILITOT 0.9  PROT 7.1  ALBUMIN 3.5   No results for input(s): LIPASE, AMYLASE in the last 168 hours. No results for input(s): AMMONIA in the last 168 hours. Coagulation Profile: No results for input(s): INR, PROTIME in the last 168 hours. Cardiac Enzymes: No results for input(s): CKTOTAL, CKMB, CKMBINDEX, TROPONINI in the last 168 hours. BNP (last 3 results) No results for input(s): PROBNP in the last 8760 hours. HbA1C: No results for input(s): HGBA1C in the last 72 hours. CBG: Recent Labs  Lab 03/22/20 2326  03/23/20 0220 03/23/20 0416 03/23/20 0803 03/23/20 1210  GLUCAP 255* 275* 213* 202* 179*   Lipid Profile: No results for input(s): CHOL, HDL, LDLCALC, TRIG, CHOLHDL, LDLDIRECT in the last 72 hours. Thyroid Function Tests: No results for input(s): TSH, T4TOTAL, FREET4, T3FREE, THYROIDAB in the last 72 hours. Anemia Panel: No results for input(s): VITAMINB12, FOLATE, FERRITIN, TIBC, IRON, RETICCTPCT in the last 72 hours. Sepsis Labs: Recent Labs  Lab 03/19/20 0458  LATICACIDVEN 2.1*    Recent Results (from the past 240 hour(s))  Urine culture     Status: Abnormal   Collection Time: 03/19/20  5:07 AM   Specimen: Urine, Catheterized  Result Value Ref Range Status   Specimen Description   Final    URINE, CATHETERIZED Performed at [facility], [address] [place], [place] [address]    Special Requests   Final    NONE Performed at [facility], [address] [place], [place] [address]    Culture (A)  Final    <10,000 COLONIES/mL INSIGNIFICANT GROWTH Performed at [facility], [address], [place], [place] [address]    Report Status 03/20/2020 FINAL  Final  SARS Coronavirus 2 by RT PCR (hospital order, performed in [facility] hospital lab) Nasopharyngeal Nasopharyngeal Swab     Status: None   Collection Time: 03/19/20  5:20 AM   Specimen: Nasopharyngeal Swab  Result Value Ref Range Status   SARS Coronavirus 2 NEGATIVE NEGATIVE Final    Comment: (NOTE) SARS-CoV-2 target nucleic acids are NOT DETECTED.  The SARS-CoV-2 RNA is generally detectable in upper and lower respiratory specimens during the acute phase of infection. The lowest concentration of SARS-CoV-2 viral copies this assay can detect is 250 copies / mL. A negative result does not preclude SARS-CoV-2 infection and should not be used as the sole basis for treatment or other patient management decisions.  A negative result may occur with improper specimen collection / handling,  submission of specimen other than nasopharyngeal swab, presence of viral mutation(s) within the areas targeted by this assay, and inadequate number of viral copies (<250 copies / mL). A negative result must be combined with clinical observations, patient history, and epidemiological information.  Fact Sheet for Patients:   BoilerBrush.com.cy  Fact Sheet for Healthcare Providers: https://pope.com/  This test is not yet approved or  cleared by the [place] FDA and has been authorized for detection and/or diagnosis of SARS-CoV-2 by FDA under an Emergency Use Authorization (EUA).  This EUA will remain in effect (meaning this test can be used) for the duration of the COVID-19 declaration under Section 564(b)(1) of the Act, 21 U.S.C. section 360bbb-3(b)(1), unless the authorization is terminated or revoked sooner.  Performed at [facility], [address] [place], [place] [address]   Culture, blood (Routine X 2) w Reflex to ID Panel     Status: None (Preliminary result)   Collection Time: 03/21/20  8:57 AM   Specimen: BLOOD RIGHT HAND  Result Value Ref Range Status   Specimen Description BLOOD RIGHT HAND  Final   Special Requests   Final    BOTTLES DRAWN AEROBIC AND ANAEROBIC Blood Culture adequate volume   Culture   Final    NO GROWTH 2 DAYS Performed at [facility], [address] [place], [place] [address]    Report Status PENDING  Incomplete  Culture, blood (Routine X 2) w Reflex to ID Panel     Status: None (Preliminary result)   Collection Time: 03/21/20 10:18 AM   Specimen: BLOOD  Result Value Ref Range Status   Specimen Description BLOOD BLOOD RIGHT HAND  Final   Special Requests   Final    BOTTLES DRAWN AEROBIC ONLY Blood Culture results may not be optimal due to an inadequate volume of blood received in culture bottles   Culture   Final    NO GROWTH 2 DAYS Performed at [facility],  [address] [place], [place] [address]    Report Status PENDING  Incomplete         Radiology Studies: No results found.      Scheduled Meds: . aspirin EC  81 mg Oral Daily  . atorvastatin  40 mg Oral q1800  . Chlorhexidine Gluconate Cloth  6 each Topical Daily  . clopidogrel  75 mg Oral Daily  . darifenacin  7.5 mg Oral Daily  . divalproex  250 mg Oral Daily  . divalproex  500 mg Oral QHS  . enoxaparin (LOVENOX) injection  40 mg Subcutaneous Q12H  . insulin aspart  0-9 Units Subcutaneous Q4H  . insulin detemir  25 Units Subcutaneous Q2200  . lisinopril  40 mg Oral Daily  . metoprolol tartrate  25 mg Oral BID  . saccharomyces boulardii  250 mg Oral BID  . sodium chloride flush  10-40 mL Intracatheter Q12H   Continuous Infusions:  Assessment & Plan:   Principal Problem:   UTI (urinary tract infection) Active Problems:   HTN (hypertension)   Diabetes mellitus without complication (HCC)   Hyperlipidemia   Acute metabolic encephalopathy   Seizure (HCC)   Elevated troponin   AKI (acute kidney injury) (HCC)   Fall   Iron deficiency anemia   Stroke (HCC)   Acute respiratory failure with hypoxia (HCC)   Distal radial fracture_left   Altered mental status/falls.  Per husband she is at her baseline.  Patient with declining health and cognitive impairment since her prior strokes. Husband works full-time and unable to provide 24/7 assistance as patient is becoming more and more dependent for all of her ADLs. -PT is recommending SNF placement.  TOC is working on it. -Patient needs long-term care facility-had a bed offer, working on English as a second language teacherinsurance authorization. -Due to persistent declining health palliative care was consulted as patient remains full code with very poor functional status. Will f/u with palliative  Abnormal UA.  Patient remained afebrile with no leukocytosis.  Urine culture with insignificant growth.  Provider over the weekend discussed with Dr. Algis LimingVandam  of ID and he advised blood cultures as she has a prior history of urine cultures positive for Granulicatella whichis a fastidious organism that can sometimes rarely be associated with endocarditis. -No need for antibiotics at this time. -Blood cultures negative.  Nursing concern about aspiration.  Formal swallow evaluation was obtained, patient with oropharyngeal phase dysphagia and they were recommending dysphagia 3 diet with nectar thick consistency liquids. -Keep head elevated at  30 degree.  Left distal radial fracture.  Secondary to mechanical fall. -Patient took off her splint.  There was significant edema of left hand and wrist.  Orthopedic was reconsulted at husband's request as patient will be going to a long-term facility. -Dr. Joice Lofts saw the patient and advised soft immobilizer due to significant edema and an outpatient follow-up in 7 to 10 days for a possible replacement of short arm cast. -Continue Tylenol for pain management-we will try avoiding narcotics.  Essential hypertension.   -Improving , and labile at times Continue isinopril to 40 mg daily. -Continue home dose of metoprolol. -Continue to monitor  AKI.  Resolved with IV fluid. Today mildly elevated aagain at 1.23, encourage po hydration. Will monitor  Type 2 diabetes mellitus without complications. Patient was on Metformin at home.  CBG mildly elevated. -Continue Levemir 25 units with SSI.  Seizure disorder.  No acute concern. -Continue home Depakote -Continue seizure precautions  History of CVA. -Continue home dose of aspirin, Plavix and Lipitor.   DVT prophylaxis: Lovenox Code Status: Full Family Communication: None at bedside Disposition Plan: SNF Status is: Inpatient  Remains inpatient appropriate because:Unsafe d/c plan   Dispo: The patient is from: home              Anticipated d/c is to: SNF              Anticipated d/c date is: 3 days              Patient currently is not medically  stable to d/c.Needs long term facility, authorization pending.             LOS: 4 days   Time spent:61min with >50%on coc    Lynn Ito, MD [facility] Pager [phone]  If 7PM-7AM, please contact night-coverage [email or web address] Password [facility] 03/23/2020, 4:43 PM

## 2020-03-23 NOTE — Progress Notes (Signed)
Inpatient Diabetes Program Recommendations  AACE/ADA: New Consensus Statement on Inpatient Glycemic Control   Target Ranges:  Prepandial:   less than 140 mg/dL      Peak postprandial:   less than 180 mg/dL (1-2 hours)      Critically ill patients:  140 - 180 mg/dL  Results for NELMA, PHAGAN (MRN [id number]) as of 03/23/2020 10:48  Ref. Range 03/23/2020 02:20 03/23/2020 04:16 03/23/2020 08:03  Glucose-Capillary Latest Ref Range: 70 - 99 mg/dL 734 (H) 037 (H) 096 (H)   Results for CLARISSIA, MCKEEN (MRN [id number]) as of 03/23/2020 10:48  Ref. Range 03/22/2020 07:38 03/22/2020 11:56 03/22/2020 16:52 03/22/2020 19:56 03/22/2020 23:26  Glucose-Capillary Latest Ref Range: 70 - 99 mg/dL 375 (H) 436 (H) 067 (H) 328 (H) 255 (H)   Review of Glycemic Control  Diabetes history: DM2 Outpatient Diabetes medications: Levemir 35 units QHS, Novolog 15 units TID with meals, Metformin XR 500 mg BID Current orders for Inpatient glycemic control: Levemir 25 units QHS, Novolog 0-9 units Q4H  Inpatient Diabetes Program Recommendations:    Insulin- Please consider increasing Levemir to 30 units QHS and adding Novolog 5 units TID with meals for meal coverage if patient eats at least 50% of meals.  Thanks, Orlando Penner, RN, MSN, CDE Diabetes Coordinator Inpatient Diabetes Program [phone] (Team Pager from 8am to 5pm)

## 2020-03-23 NOTE — Consult Note (Addendum)
Consultation Note Date: 03/23/2020   Patient Name: [name]  DOB: Sep 06, 1966  MRN: [id number]  Age / Sex: 54 y.o., female  PCP: [facility] Referring Physician: Lynn Ito, MD  Reason for Consultation: Establishing goals of care  HPI/Patient Profile: Alyxis Grippi is a 54 y.o. female with medical history significant of hypertension, hyperlipidemia, diabetes mellitus, stroke, [name], depression, anxiety, seizure, who presents with AMS and fall.   Clinical Assessment and Goals of Care: Patient is resting in bed. She has a Cabin crew. She is very selective in answering questions, and only answers the question without elaboration.   She states her full name. She states she is at the hospital, and that it is July 2021, and we just celebrated July 4th. She states she is married. She tells me she has 6 children with age range of 97-30. She states she has grandchildren. I gave her 3 words to remember: table, apple, penny, which she was able to recall after discussing her family.   She states at home she uses a cane. She discusses falling and breaking her wrist a bit over a week ago.  She states she is no longer able to cook or clean. She is able to bathe and groom herself. She states she is ready to go home when she is ready for D/C. Plans in place for D/C to facility. She does not want to go to a facility. She states as far as health care, she would not want a feeding tube, said "no breathing tube", and upon asking about chest compressions, shocks, or a breathing tube if her heart or lungs stopped, she said   "no, let me go on my own."   Order placed for OT cog eval. Concern for higher level cognition and safety.       SUMMARY OF RECOMMENDATIONS   OT cog eval ordered as patient states she does not want to go to a facility. Following the results of the eval, will have GOC conversation.    Prognosis:   Unable to determine      Primary Diagnoses: Present on Admission: . UTI (urinary tract infection) . Acute metabolic encephalopathy . Fall . AKI (acute kidney injury) (HCC) . Elevated troponin . HTN (hypertension) . Hyperlipidemia . Iron deficiency anemia . Stroke (HCC) . Acute respiratory failure with hypoxia (HCC) . Distal radial fracture_left   I have reviewed the medical record, interviewed the patient and family, and examined the patient. The following aspects are pertinent.  Past Medical History:  Diagnosis Date  . Allergic rhinitis   . Chickenpox   . Depression   . Diabetes (HCC)   . Epilepsy (HCC)   . Headache   . HTN (hypertension)   . Hyperlipidemia   . Seizures (HCC)   . Stroke (HCC)   . Urinary incontinence    Social History   Socioeconomic History  . Marital status: Married    Spouse name: Not on file  . Number of children: Not on file  . Years of education:  Not on file  . Highest education level: Not on file  Occupational History  . Not on file  Tobacco Use  . Smoking status: Never Smoker  . Smokeless tobacco: Never Used  Substance and Sexual Activity  . Alcohol use: No    Alcohol/week: 0.0 standard drinks  . Drug use: No  . Sexual activity: Not on file  Other Topics Concern  . Not on file  Social History Narrative  . Not on file   Social Determinants of Health   Financial Resource Strain:   . Difficulty of Paying Living Expenses:   Food Insecurity:   . Worried About Programme researcher, broadcasting/film/video in the Last Year:   . Barista in the Last Year:   Transportation Needs:   . Freight forwarder (Medical):   Marland Kitchen Lack of Transportation (Non-Medical):   Physical Activity:   . Days of Exercise per Week:   . Minutes of Exercise per Session:   Stress:   . Feeling of Stress :   Social Connections:   . Frequency of Communication with Friends and Family:   . Frequency of Social Gatherings with Friends and Family:   .  Attends Religious Services:   . Active Member of Clubs or Organizations:   . Attends Banker Meetings:   Marland Kitchen Marital Status:    Family History  Problem Relation Age of Onset  . Hypertension Mother   . Diabetes Mellitus II Mother   . Hypertension Father   . Pancreatic cancer Father    Scheduled Meds: . aspirin EC  81 mg Oral Daily  . atorvastatin  40 mg Oral q1800  . Chlorhexidine Gluconate Cloth  6 each Topical Daily  . clopidogrel  75 mg Oral Daily  . darifenacin  7.5 mg Oral Daily  . divalproex  250 mg Oral Daily  . divalproex  500 mg Oral QHS  . enoxaparin (LOVENOX) injection  40 mg Subcutaneous Q12H  . insulin aspart  0-9 Units Subcutaneous Q4H  . insulin detemir  25 Units Subcutaneous Q2200  . lisinopril  40 mg Oral Daily  . metoprolol tartrate  25 mg Oral BID  . saccharomyces boulardii  250 mg Oral BID  . sodium chloride flush  10-40 mL Intracatheter Q12H   Continuous Infusions: PRN Meds:.acetaminophen **OR** acetaminophen, albuterol, ALPRAZolam, hydrALAZINE, LORazepam, sodium chloride flush Medications Prior to Admission:  Prior to Admission medications   Medication Sig Start Date End Date Taking? Authorizing Provider  acetaminophen-codeine (TYLENOL #3) 300-30 MG tablet Take 1 tablet by mouth every 4 (four) hours as needed. 03/16/20  Yes [provider]  aspirin EC 81 MG EC tablet Take 1 tablet (81 mg total) by mouth daily. Swallow whole. 03/04/20  Yes Glade Lloyd, MD  atorvastatin (LIPITOR) 40 MG tablet Take 1 tablet (40 mg total) by mouth daily at 6 PM. 08/16/16  Yes Ghimire, Werner Lean, MD  clopidogrel (PLAVIX) 75 MG tablet Take 75 mg by mouth daily. 12/22/19  Yes [provider]  divalproex (DEPAKOTE) 250 MG DR tablet Take 250-500 mg by mouth 2 (two) times daily. Take one tablet (250 mg) by mouth every morning and two tablets (500 mg) at bedtime 07/13/19  Yes [provider]  insulin aspart (NOVOLOG FLEXPEN) 100 UNIT/ML FlexPen  Inject 10 Units into the skin 3 (three) times daily with meals. Patient taking differently: Inject 15 Units into the skin 3 (three) times daily with meals.  08/16/16  Yes Ghimire, Werner Lean, MD  insulin  detemir (LEVEMIR FLEXPEN) 100 UNIT/ML FlexPen Inject 35 Units into the skin daily at 10 pm. 03/03/20  Yes Glade Lloyd, MD  lisinopril (ZESTRIL) 5 MG tablet Take 5 mg by mouth daily.   Yes [provider]  metFORMIN (GLUCOPHAGE-XR) 500 MG 24 hr tablet Take 500 mg by mouth 2 (two) times daily. 08/23/19  Yes [provider]  metoprolol tartrate (LOPRESSOR) 25 MG tablet Take 25 mg by mouth 2 (two) times a day.   Yes [provider]  saccharomyces boulardii (FLORASTOR) 250 MG capsule Take 250 mg by mouth 2 (two) times daily.   Yes [provider]  VESICARE 10 MG tablet Take 10 mg by mouth 2 (two) times daily. 05/26/19  Yes [provider]   No Known Allergies Review of Systems  All other systems reviewed and are negative.   Physical Exam Pulmonary:     Effort: Pulmonary effort is normal.  Neurological:     Mental Status: She is alert.     Vital Signs: BP (!) 154/122 (BP Location: Right Arm)   Pulse 91   Temp 97.9 F (36.6 C) (Oral)   Resp 18   Ht 5\' 2"  (1.575 m)   Wt 120.2 kg   SpO2 100%   BMI 48.47 kg/m  Pain Scale: 0-10   Pain Score: 0-No pain   SpO2: SpO2: 100 % O2 Device:SpO2: 100 % O2 Flow Rate: .O2 Flow Rate (L/min): 2 L/min  IO: Intake/output summary:   Intake/Output Summary (Last 24 hours) at 03/23/2020 1559 Last data filed at 03/23/2020 0930 Gross per 24 hour  Intake 0 ml  Output 0 ml  Net 0 ml    LBM: Last BM Date: 03/23/20 Baseline Weight: Weight: 110 kg Most recent weight: Weight: 120.2 kg     Palliative Assessment/Data:     Time In: 3:30 Time Out: 4:00 Time Total: 30 min Greater than 50%  of this time was spent counseling and coordinating care related to the above assessment and plan.  Signed by: 05/24/20, NP   Please contact Palliative Medicine Team phone at [phone] for questions and concerns.  For individual provider: See [phone]

## 2020-03-23 NOTE — Progress Notes (Signed)
Physical Therapy Treatment Patient Details Name: [name] MRN: [id number] DOB: 1966-06-17 Today's Date: 03/23/2020    History of Present Illness Pt. is a 54 y.o. female who was admitted to [facility] UTI, AMS, recent recurrent falls, recent Left Distal radius fracture.    PT Comments    Pt was long sitting in bed upon arriving. She has flat affect but with encouragement did agree to PT session. Was able to exit bed with mod-max assist + increased time. Pt struggles to initiate movements. Did require vcs to avoid use of LUE.She has poor sitting balance with posterior push present. Did perform STS EOB with +2 assist for safety. Unable to static stand for more than ~ 8 sec. Reports fatigue after 4th standing trial and required max assist to return to supine. Overall pt tolerated session well. Will continue to benefit from skilled PT at DC to address deficits and continue to progress to PLOF. Pt was supine in bed with call bell in reach and tele-sitter in room. Acute PT will continue to follow per POC and progress as able per pt tolerance.     Follow Up Recommendations  SNF     Equipment Recommendations  None recommended by PT    Recommendations for Other Services       Precautions / Restrictions Precautions Precautions: Fall Restrictions Weight Bearing Restrictions: No LUE Weight Bearing: Weight bearing as tolerated    Mobility  Bed Mobility Overal bed mobility: Needs Assistance Bed Mobility: Supine to Sit     Supine to sit: Mod assist;Max assist Sit to supine: Max assist   General bed mobility comments: pt requires increased time to perform and initiate movements 2/2 to cognition. required mod-max to achieve EOB sitting and max assist to retunr to supine after OOB activity  Transfers Overall transfer level: Needs assistance Equipment used: 1 person hand held assist (2nd person protecteding LLE from buckling.) Transfers: Sit to/from Stand Sit to Stand: Min assist;+2  safety/equipment         General transfer comment: pt performed STS 4 x EOB with +1 HHA + 2nd person CGA for safety. bed height elevated and moderate vcs for safety  Ambulation/Gait             General Gait Details: pt unable to tolerate static standing long enough to progress to ambulation. pt does present with posterior push in sitting/standing   Stairs             Wheelchair Mobility    Modified Rankin (Stroke Patients Only)       Balance Overall balance assessment: Needs assistance Sitting-balance support: No upper extremity supported;Feet supported Sitting balance-Leahy Scale: Fair Sitting balance - Comments: several occasions of posterior lean in sitting with constant vcs for fwd wt shift. CGA-min throughout EOB sitting Postural control: Posterior lean Standing balance support: Bilateral upper extremity supported;During functional activity Standing balance-Leahy Scale: Poor                              Cognition Arousal/Alertness: Awake/alert Behavior During Therapy: Flat affect Overall Cognitive Status: History of cognitive impairments - at baseline Area of Impairment: Attention;Problem solving;Awareness                       Following Commands: Follows one step commands with increased time       General Comments: follows commands with increased time and repetition      Exercises  General Comments        Pertinent Vitals/Pain Pain Assessment:  (reports pain in LUE/wrist) Pain Location: L wrist Pain Descriptors / Indicators: Grimacing Pain Intervention(s): Limited activity within patient's tolerance;Monitored during session;Premedicated before session    Home Living                      Prior Function            PT Goals (current goals can now be found in the care plan section) Acute Rehab PT Goals Patient Stated Goal: none stated Progress towards PT goals: Progressing toward goals     Frequency    Min 2X/week      PT Plan Current plan remains appropriate    Co-evaluation     PT goals addressed during session: Mobility/safety with mobility        AM-PAC PT "6 Clicks" Mobility   Outcome Measure  Help needed turning from your back to your side while in a flat bed without using bedrails?: A Lot Help needed moving from lying on your back to sitting on the side of a flat bed without using bedrails?: A Lot Help needed moving to and from a bed to a chair (including a wheelchair)?: A Lot Help needed standing up from a chair using your arms (e.g., wheelchair or bedside chair)?: A Lot Help needed to walk in hospital room?: A Lot Help needed climbing 3-5 steps with a railing? : A Lot 6 Click Score: 12    End of Session Equipment Utilized During Treatment: Gait belt Activity Tolerance: Patient tolerated treatment well Patient left: in bed;with call bell/phone within reach;with bed alarm set Nurse Communication: Mobility status PT Visit Diagnosis: Unsteadiness on feet (R26.81);Muscle weakness (generalized) (M62.81);Difficulty in walking, not elsewhere classified (R26.2);Other abnormalities of gait and mobility (R26.89);Hemiplegia and hemiparesis;Repeated falls (R29.6) Hemiplegia - Right/Left: Left Hemiplegia - caused by: Cerebral infarction     Time: 1191-4782 PT Time Calculation (min) (ACUTE ONLY): 16 min  Charges:  $Therapeutic Activity: 8-22 mins                     Jetta Lout PTA 03/23/20, 4:55 PM

## 2020-03-23 NOTE — Plan of Care (Signed)
  Problem: Pain Managment: Goal: General experience of comfort will improve Outcome: Progressing   

## 2020-03-24 LAB — BASIC METABOLIC PANEL
Anion gap: 7 (ref 5–15)
BUN: 23 mg/dL — ABNORMAL HIGH (ref 6–20)
CO2: 28 mmol/L (ref 22–32)
Calcium: 8.8 mg/dL — ABNORMAL LOW (ref 8.9–10.3)
Chloride: 104 mmol/L (ref 98–111)
Creatinine, Ser: 1.11 mg/dL — ABNORMAL HIGH (ref 0.44–1.00)
GFR calc Af Amer: >60 mL/min (ref >60)
GFR calc non Af Amer: 56 mL/min — ABNORMAL LOW (ref >60)
Glucose, Bld: 209 mg/dL — ABNORMAL HIGH (ref 70–99)
Potassium: 4.1 mmol/L (ref 3.5–5.1)
Sodium: 139 mmol/L (ref 135–145)

## 2020-03-24 LAB — GLUCOSE, CAPILLARY
Glucose-Capillary: 173 mg/dL — ABNORMAL HIGH (ref 70–99)
Glucose-Capillary: 201 mg/dL — ABNORMAL HIGH (ref 70–99)
Glucose-Capillary: 213 mg/dL — ABNORMAL HIGH (ref 70–99)
Glucose-Capillary: 221 mg/dL — ABNORMAL HIGH (ref 70–99)
Glucose-Capillary: 254 mg/dL — ABNORMAL HIGH (ref 70–99)
Glucose-Capillary: 286 mg/dL — ABNORMAL HIGH (ref 70–99)

## 2020-03-24 NOTE — Progress Notes (Signed)
Inpatient Diabetes Program Recommendations  AACE/ADA: New Consensus Statement on Inpatient Glycemic Control   Target Ranges:  Prepandial:   less than 140 mg/dL      Peak postprandial:   less than 180 mg/dL (1-2 hours)      Critically ill patients:  140 - 180 mg/dL   Results for [name], [name] (MRN [id number]) as of 03/24/2020 10:26  Ref. Range 03/23/2020 08:03 03/23/2020 12:10 03/23/2020 16:50 03/23/2020 20:09 03/24/2020 00:43 03/24/2020 04:15 03/24/2020 07:38  Glucose-Capillary Latest Ref Range: 70 - 99 mg/dL 729 (H) 021 (H) 115 (H) 252 (H) 254 (H) 213 (H) 173 (H)   Review of Glycemic Control  Diabetes history: DM2 Outpatient Diabetes medications: Levemir 35 units QHS, Novolog 15 units TID with meals, Metformin XR 500 mg BID Current orders for Inpatient glycemic control: Levemir 25 units QHS, Novolog 0-9 units Q4H  Inpatient Diabetes Program Recommendations:    Insulin- Please consider increasing Levemir to 30 units QHS and adding Novolog 4 units TID with meals for meal coverage if patient eats at least 50% of meals.  Thanks, Orlando Penner, RN, MSN, CDE Diabetes Coordinator Inpatient Diabetes Program [phone] (Team Pager from 8am to 5pm)

## 2020-03-24 NOTE — TOC Progression Note (Signed)
Transition of Care [facility]) - Progression Note    Patient Details  Name: [name] MRN: [id number] Date of Birth: Sep 08, 1966  Transition of Care [facility]) CM/SW [place], RN Phone Number: 03/24/2020, 11:24 AM  Clinical Narrative:    Met with the husband and explained that a cognitive evaluation will be done to determine if the patient is able to make informed decisions, If she is deemed able to do so if she says she wants to go home we can not force her to go to SNF, He stated that she is unsafe at home and that there is noone there to help her, He stated that she will say whatever she has to to be able to go home, I explained legally we can not force a person that is able to make decisions to go to SNF, He stated understanding, He asked for a letter for his job showing that she is in the hospital, a letter was created and I let him know that he can pick it up, he stated he would get in in a while        Expected Discharge Plan and Services                                                 Social Determinants of Health (SDOH) Interventions    Readmission Risk Interventions Readmission Risk Prevention Plan 07/25/2019  Post Dischage Appt Complete  Medication Screening Complete  Transportation Screening Complete  Some recent data might be hidden

## 2020-03-24 NOTE — TOC Progression Note (Signed)
Transition of Care [facility]) - Progression Note    Patient Details  Name: [name] MRN: [id number] Date of Birth: May 21, 1966  Transition of Care [facility]) CM/SW Contact  Barrie Dunker, RN Phone Number: 03/24/2020, 4:18 PM  Clinical Narrative:   Husband came to the room and I provided him with a letter for his employer showing that the patient is admitted in the hospital         Expected Discharge Plan and Services                                                 Social Determinants of Health (SDOH) Interventions    Readmission Risk Interventions Readmission Risk Prevention Plan 07/25/2019  Post Dischage Appt Complete  Medication Screening Complete  Transportation Screening Complete  Some recent data might be hidden

## 2020-03-24 NOTE — Progress Notes (Addendum)
PROGRESS NOTE    [name]  ZOX:[id number] DOB: 10/03/1965 DOA: 03/19/2020 PCP: [facility]    Brief Narrative:  [name] Rawndrea Leeis a 54 y.o.femalewith medical history significant ofhypertension, hyperlipidemia, diabetes mellitus, stroke, [name], depression, anxiety, seizure,who presents with AMS and fall.   Per report, pt recently had fall and wasseen in ED 6/29, and found to have left distal radius fracture.Wrist splint was placed.Patientwasdischarged home with prescriptions for a low-dose oftylenol with codeine. Patient is to follow up withorthopedics.Per EDP, pt was noted to be confused and hasgeneralized weakness. Patient took offwrist splint.She felltwice due to her legs bucklingwhen her husbandwas trying to get her here to the hospital.He was able to catch her and she did not strike her head.Her husband didnot think patient accidentally took too many pain medicines. Concern of UTI, urine culture with insignificant growth.  Of note, patient has no IV access as she was noncooperative with central line which time to be placed yesterday. However as patient does not appear to have any acute infection, we have agreed to not place a PICC line unless need for IV access becomes urgent.    Consultants:   Palliative orthopedics  Procedures:   Antimicrobials:       Subjective: Patient is alert and oriented x3.  Denies any chest pain, shortness of breath, or any other symptoms.  Objective: Vitals:   03/23/20 0806 03/23/20 1516 03/24/20 0042 03/24/20 0735  BP: 121/90 (!) 154/122 (!) 160/92 (!) 155/92  Pulse: 90 91 87 88  Resp: 20 18 18 17   Temp: (!) 97.2 F (36.2 C) 97.9 F (36.6 C) 98.6 F (37 C) 98.1 F (36.7 C)  TempSrc: Axillary Oral Oral Oral  SpO2: 97% 100% 100% 99%  Weight:      Height:        Intake/Output Summary (Last 24 hours) at 03/24/2020 0813 Last data filed at 03/23/2020 0930 Gross per 24 hour  Intake 0 ml  Output --   Net 0 ml   Filed Weights   03/19/20 0435 03/20/20 0300  Weight: 110 kg 120.2 kg    Examination:  General exam: Sleeping, barely opening eyes for me snoring Respiratory system: Clear to auscultation.  With poor respiratory effort no wheeze rales rhonchi's Cardiovascular system: S1 & S2 heard, RRR. No JVD, murmurs, rubs, gallops or clicks.  Gastrointestinal system: Abdomen is nondistended, soft and nontender. Normal bowel sounds heard. Central nervous system: Unable to assess as she is sleeping Extremities: No edema Skin: Warm dry Psychiatry: Unable to assess    Data Reviewed: I have personally reviewed following labs and imaging studies  CBC: Recent Labs  Lab 03/19/20 0458 03/20/20 0507 03/21/20 0857 03/23/20 0737  WBC 6.0 4.5 5.3 6.1  NEUTROABS 3.1  --   --   --   HGB 8.7* 8.2* 10.2* 9.0*  HCT 27.3* 25.8* 31.6* 27.7*  MCV 63.5* 62.9* 62.7* 62.0*  PLT 189 179 205 180   Basic Metabolic Panel: Recent Labs  Lab 03/19/20 0458 03/20/20 0507 03/21/20 0857 03/23/20 0737 03/24/20 0601  NA 141 142 140 140 139  K 4.0 3.6 3.5 3.7 4.1  CL 105 105 103 104 104  CO2 26 28 26 26 28   GLUCOSE 151* 145* 170* 202* 209*  BUN 27* 18 10 24* 23*  CREATININE 1.36* 0.87 0.83 1.23* 1.11*  CALCIUM 8.8* 8.6* 9.1 8.7* 8.8*   GFR: Estimated Creatinine Clearance: 71.4 mL/min (A) (by C-G formula based on SCr of 1.11 mg/dL (H)). Liver Function Tests:  Recent Labs  Lab 03/19/20 0458  AST 20  ALT 13  ALKPHOS 31*  BILITOT 0.9  PROT 7.1  ALBUMIN 3.5   No results for input(s): LIPASE, AMYLASE in the last 168 hours. No results for input(s): AMMONIA in the last 168 hours. Coagulation Profile: No results for input(s): INR, PROTIME in the last 168 hours. Cardiac Enzymes: No results for input(s): CKTOTAL, CKMB, CKMBINDEX, TROPONINI in the last 168 hours. BNP (last 3 results) No results for input(s): PROBNP in the last 8760 hours. HbA1C: No results for input(s): HGBA1C in the last 72  hours. CBG: Recent Labs  Lab 03/23/20 1650 03/23/20 2009 03/24/20 0043 03/24/20 0415 03/24/20 0738  GLUCAP 161* 252* 254* 213* 173*   Lipid Profile: No results for input(s): CHOL, HDL, LDLCALC, TRIG, CHOLHDL, LDLDIRECT in the last 72 hours. Thyroid Function Tests: No results for input(s): TSH, T4TOTAL, FREET4, T3FREE, THYROIDAB in the last 72 hours. Anemia Panel: No results for input(s): VITAMINB12, FOLATE, FERRITIN, TIBC, IRON, RETICCTPCT in the last 72 hours. Sepsis Labs: Recent Labs  Lab 03/19/20 0458  LATICACIDVEN 2.1*    Recent Results (from the past 240 hour(s))  Urine culture     Status: Abnormal   Collection Time: 03/19/20  5:07 AM   Specimen: Urine, Catheterized  Result Value Ref Range Status   Specimen Description   Final    URINE, CATHETERIZED Performed at [facility], [address], [place], [place] [address]    Special Requests   Final    NONE Performed at [facility], [address], [place], [place] [address]    Culture (A)  Final    <10,000 COLONIES/mL INSIGNIFICANT GROWTH Performed at [facility], [address], [place], [place] [address]    Report Status 03/20/2020 FINAL  Final  SARS Coronavirus 2 by RT PCR (hospital order, performed in [facility] hospital lab) Nasopharyngeal Nasopharyngeal Swab     Status: None   Collection Time: 03/19/20  5:20 AM   Specimen: Nasopharyngeal Swab  Result Value Ref Range Status   SARS Coronavirus 2 NEGATIVE NEGATIVE Final    Comment: (NOTE) SARS-CoV-2 target nucleic acids are NOT DETECTED.  The SARS-CoV-2 RNA is generally detectable in upper and lower respiratory specimens during the acute phase of infection. The lowest concentration of SARS-CoV-2 viral copies this assay can detect is 250 copies / mL. A negative result does not preclude SARS-CoV-2 infection and should not be used as the sole basis for treatment or other patient management decisions.  A negative result may occur  with improper specimen collection / handling, submission of specimen other than nasopharyngeal swab, presence of viral mutation(s) within the areas targeted by this assay, and inadequate number of viral copies (<250 copies / mL). A negative result must be combined with clinical observations, patient history, and epidemiological information.  Fact Sheet for Patients:   BoilerBrush.com.cy  Fact Sheet for Healthcare Providers: https://pope.com/  This test is not yet approved or  cleared by the [place] FDA and has been authorized for detection and/or diagnosis of SARS-CoV-2 by FDA under an Emergency Use Authorization (EUA).  This EUA will remain in effect (meaning this test can be used) for the duration of the COVID-19 declaration under Section 564(b)(1) of the Act, 21 U.S.C. section 360bbb-3(b)(1), unless the authorization is terminated or revoked sooner.  Performed at [facility], [address] [place], [place] [address]   Culture, blood (Routine X 2) w Reflex to ID Panel  Status: None (Preliminary result)   Collection Time: 03/21/20  8:57 AM   Specimen: BLOOD RIGHT HAND  Result Value Ref Range Status   Specimen Description BLOOD RIGHT HAND  Final   Special Requests   Final    BOTTLES DRAWN AEROBIC AND ANAEROBIC Blood Culture adequate volume   Culture   Final    NO GROWTH 3 DAYS Performed at [facility], [address], [place], [place] [address]    Report Status PENDING  Incomplete  Culture, blood (Routine X 2) w Reflex to ID Panel     Status: None (Preliminary result)   Collection Time: 03/21/20 10:18 AM   Specimen: BLOOD  Result Value Ref Range Status   Specimen Description BLOOD BLOOD RIGHT HAND  Final   Special Requests   Final    BOTTLES DRAWN AEROBIC ONLY Blood Culture results may not be optimal due to an inadequate volume of blood received in culture bottles   Culture   Final    NO GROWTH  3 DAYS Performed at [facility], [address], [place], [place] [address]    Report Status PENDING  Incomplete         Radiology Studies: No results found.      Scheduled Meds: . aspirin EC  81 mg Oral Daily  . atorvastatin  40 mg Oral q1800  . Chlorhexidine Gluconate Cloth  6 each Topical Daily  . clopidogrel  75 mg Oral Daily  . darifenacin  7.5 mg Oral Daily  . divalproex  250 mg Oral Daily  . divalproex  500 mg Oral QHS  . enoxaparin (LOVENOX) injection  40 mg Subcutaneous Q12H  . insulin aspart  0-9 Units Subcutaneous Q4H  . insulin detemir  25 Units Subcutaneous Q2200  . lisinopril  40 mg Oral Daily  . metoprolol tartrate  25 mg Oral BID  . saccharomyces boulardii  250 mg Oral BID  . sodium chloride flush  10-40 mL Intracatheter Q12H   Continuous Infusions:  Assessment & Plan:   Principal Problem:   UTI (urinary tract infection) Active Problems:   HTN (hypertension)   Diabetes mellitus without complication (HCC)   Hyperlipidemia   Acute metabolic encephalopathy   Seizure (HCC)   Elevated troponin   AKI (acute kidney injury) (HCC)   Fall   Iron deficiency anemia   Stroke (HCC)   Acute respiratory failure with hypoxia (HCC)   Distal radial fracture_left   Altered mental status/falls.  Per husband she is at her baseline.  Patient with declining health and cognitive impairment since her prior strokes. Husband works full-time and unable to provide 24/7 assistance as patient is becoming more and more dependent for all of her ADLs. -PT is recommending SNF placement.  TOC is working on it. -Patient needs long-term care facility-had a bed offer, working on English as a second language teacher. -Due to persistent declining health palliative care was consulted as patient remains full code with very poor functional status. Palliative's input was appreciated please see note     Abnormal UA.  Patient remained afebrile with no leukocytosis.  Urine culture with  insignificant growth.  Provider over the weekend discussed with Dr. Algis Liming of ID and he advised blood cultures as she has a prior history of urine cultures positive for Granulicatella whichis a fastidious organism that can sometimes rarely be associated with endocarditis. -No need for antibiotics at this time. -Blood cultures negative.  Nursing concern about aspiration.  Formal swallow evaluation was obtained, patient with oropharyngeal phase dysphagia  and they were recommending dysphagia 3 diet with nectar thick consistency liquids. -Keep head elevated at 30 degree.  Left distal radial fracture.  Secondary to mechanical fall. -Patient took off her splint.  There was significant edema of left hand and wrist.  Orthopedic was reconsulted at husband's request as patient will be going to a long-term facility. -Dr. Joice Lofts saw the patient and advised soft immobilizer due to significant edema and an outpatient follow-up in 7 to 10 days for a possible replacement of short arm cast. -Continue Tylenol for pain management-we will try avoiding narcotics.  Essential hypertension.   -Improving , and labile at times Continue isinopril to 40 mg daily. -Continue home dose of metoprolol. -Continue to monitor  AKI.  Resolved with IV fluid. Today mildly elevated aagain at 1.11, encouraged po hydration. Will monitor  Type 2 diabetes mellitus without complications. Patient was on Metformin at home.  CBG mildly elevated. -Continue Levemir 25 units with SSI.  Seizure disorder.  No acute concern. -Continue home Depakote -Continue seizure precautions  History of CVA. -Continue home dose of aspirin, Plavix and Lipitor.   DVT prophylaxis: Lovenox Code Status: Full Family Communication: None at bedside Disposition Plan: SNF Status is: Inpatient  Remains inpatient appropriate because:Unsafe d/c plan   Dispo: The patient is from: home              Anticipated d/c is to: SNF               Anticipated d/c date is: 3 days              Patient currently is not medically stable to d/c.Needs long term facility, authorization pending.             LOS: 5 days   Time spent:65min with >50%on coc    Lynn Ito, MD [facility] Pager [phone]  If 7PM-7AM, please contact night-coverage [email or web address] Password [facility] 03/24/2020, 8:13 AM Patient ID: [name], female   DOB: 12/04/1965, 54 y.o.   MRN: [id number]

## 2020-03-24 NOTE — Evaluation (Addendum)
Occupational Therapy Re-evaluation Patient Details Name: Barry Culverhouse MRN: [id number] DOB: 15-Apr-1966 Today's Date: 03/24/2020    History of Present Illness Pt. is a 54 y.o. female who was admitted to [facility] UTI, AMS, recent recurrent falls, recent Left Distal radius fracture.  Seen for cognitive evaluation this date 2/2 treatment team's concerns with judgment and safety awareness.   Clinical Impression   Ms. Elsbury seen by OT this date for cognitive evaluation to determine safety awareness, judgment, and cognition within the context of discharge planning.  Ms. Arseneau presents to OT primarily limited by lethargy and fatigue that prevents her from attending to tasks within context of the evaluation.  OTR engaged pt in tasks involving various safety scenarios and identifying fall hazards.  Pt would frequently fall asleep during evaluation or turn the TV on, showing short term memory deficits as OTR repeatedly asked pt to turn the TV off during evaluation.  However, with moderate verbal cues from OTR to attend to task, Ms. Gelber was able to identify safe ways to navigate various scenarios at home as well as fall hazards in the situation presented.   Ms. Fort was able to verbalize safe decisions within the scenarios, including calling her daughter and removing fall hazards within the environment.  OTR continues to recommend SNF as most appropriate discharge recommendation as pt requires high level of physical assist for self care tasks, has decreased caregiver support at home 2/2 pt's husband working full time, and impaired cognition that can impact safety and decision making at home.  Ms. Helming verbalized agreement to discharging to SNF during this session.  It is also important to to note that it is not within occupational therapy's scope of practice to determine if this patient is competent to make her own decisions.   OTR educated pt on benefits of SNF and why therapy is recommending this as the safest discharge  recommendation, pt verbalized understanding.  Ms. Delavega will continue to benefit from skilled OT services in acute setting to address functional strengthening, cognition, safety, and independence in ADLs.   Follow Up Recommendations  SNF;Supervision/Assistance - 24 hour    Equipment Recommendations  None recommended by OT    Recommendations for Other Services       Precautions / Restrictions Precautions Precautions: Fall Restrictions Weight Bearing Restrictions: Yes LUE Weight Bearing: Weight bearing as tolerated      Mobility Bed Mobility Overal bed mobility: Needs Assistance Bed Mobility: Supine to Sit Rolling: Max assist   Supine to sit: Mod assist;Max assist Sit to supine: Max assist      Transfers Overall transfer level: Needs assistance                    Balance Overall balance assessment: Needs assistance Sitting-balance support: No upper extremity supported;Feet supported Sitting balance-Leahy Scale: Fair     Standing balance support: Bilateral upper extremity supported;During functional activity Standing balance-Leahy Scale: Poor                             ADL either performed or assessed with clinical judgement   ADL Overall ADL's : Needs assistance/impaired Eating/Feeding: Set up Eating/Feeding Details (indicate cue type and reason): Using RUE only, complete set-up needed Grooming: Minimal assistance;Set up Grooming Details (indicate cue type and reason): Using RUE only Upper Body Bathing: Maximal assistance   Lower Body Bathing: Maximal assistance   Upper Body Dressing : Maximal assistance   Lower Body Dressing:  Maximal assistance     Toilet Transfer Details (indicate cue type and reason): not tested, suspect max assist x2 for toilet transfer per recent reports.  Pt often total A at bedlevel for toileting/perihygiene.   Toileting - Clothing Manipulation Details (indicate cue type and reason): not tested   Tub/Shower  Transfer Details (indicate cue type and reason): not tested   General ADL Comments: Functional mobility not assessed this date, requires mod-max A x2 from recent reports     Vision Patient Visual Report: No change from baseline Additional Comments: Pt able to view/distinguish pictures on piece of paper, but unable to read small print     Perception     Praxis      Pertinent Vitals/Pain Pain Assessment: No/denies pain     Hand Dominance Right   Extremity/Trunk Assessment Upper Extremity Assessment Upper Extremity Assessment: Generalized weakness   Lower Extremity Assessment Lower Extremity Assessment: Generalized weakness       Communication Communication Communication: No difficulties   Cognition Arousal/Alertness: Lethargic;Awake/alert Behavior During Therapy: Flat affect Overall Cognitive Status: History of cognitive impairments - at baseline Area of Impairment: Attention;Problem solving;Awareness;Safety/judgement                   Current Attention Level: Alternating;Selective   Following Commands: Follows one step commands inconsistently Safety/Judgement: Decreased awareness of deficits   Problem Solving: Decreased initiation;Slow processing;Requires verbal cues;Requires tactile cues General Comments: Pt with alternating levels of awareness and command following throughout evaluation.  Pt lethargic, often falling asleep during cognitive evaluation.  Pt then able to perform tasks adequately with min-mod verbal prompting from OTR to attend to task.   General Comments       Exercises Other Exercises Other Exercises: provided education re: judgment, safety awareness, and home safety/fall hazards for safety upon d/c   Shoulder Instructions      Home Living Family/patient expects to be discharged to:: Private residence Living Arrangements: Spouse/significant other Available Help at Discharge: Family;Available PRN/intermittently Type of Home: House Home  Access: Stairs to enter [employer] of Steps: 4-5 in front with rails, 2-3 in back without rails (prefers front)   Home Layout: One level     Bathroom Shower/Tub: Tub/shower unit;Curtain   Firefighter: Standard     Home Equipment: Cane - single point;Walker - 2 wheels;Grab bars - tub/shower;Shower seat;Hospital bed   Additional Comments: husband present, reports that they normally are able to manage at home, but she is too weak and limited now for him to feel comfortable trying.  She does have steps to enter 4-5 with rails or 2-3 w/o rails  Lives With: Spouse    Prior Functioning/Environment Level of Independence: Needs assistance  Gait / Transfers Assistance Needed: Per husband, patient will transfer and perform short mobility distances within the home with his assistance.  With a walker when they can remind her or heavily leaning on furniture/walls if she self initiates.  Does endorse recurrent falls within the home, at least 3 in week prior to admission. ADL's / Homemaking Assistance Needed: Pt.'s husband assists with ADLs, and IADLs, medication management, and meal preparation.   Comments: Pt. does not drive        OT Problem List: Decreased strength;Decreased range of motion;Decreased activity tolerance;Impaired balance (sitting and/or standing);Decreased cognition;Decreased safety awareness;Decreased knowledge of use of DME or AE;Impaired vision/perception;Impaired UE functional use      OT Treatment/Interventions: Self-care/ADL training;Therapeutic exercise;Energy conservation;DME and/or AE instruction;Therapeutic activities;Cognitive remediation/compensation;Patient/family education;Balance training;Visual/perceptual remediation/compensation    OT Goals(Current  goals can be found in the care plan section) Acute Rehab OT Goals Patient Stated Goal: none stated OT Goal Formulation: With patient/family Time For Goal Achievement: 04/03/20 Potential to Achieve  Goals: Good  OT Frequency: Min 1X/week   Barriers to D/C: Decreased caregiver support  Pt's husband at risk for injury if he continues to provide max/total assist for pt in self care tasks, unable to provide 24/7 supervision 2/2 working full time       Co-evaluation              AM-PAC OT "6 Clicks" Daily Activity     Outcome Measure Help from another person eating meals?: A Little Help from another person taking care of personal grooming?: A Little Help from another person toileting, which includes using toliet, bedpan, or urinal?: A Lot Help from another person bathing (including washing, rinsing, drying)?: A Lot Help from another person to put on and taking off regular upper body clothing?: A Lot Help from another person to put on and taking off regular lower body clothing?: A Lot 6 Click Score: 14   End of Session    Activity Tolerance: Patient limited by fatigue Patient left: in bed;with call bell/phone within reach;with bed alarm set  OT Visit Diagnosis: Other abnormalities of gait and mobility (R26.89);Muscle weakness (generalized) (M62.81);Other symptoms and signs involving cognitive function;Low vision, both eyes (H54.2)                Time: 6599-3570 OT Time Calculation (min): 20 min Charges:  OT General Charges $OT Visit: 1 Visit OT Evaluation $OT Eval Moderate Complexity: 1 Mod OT Treatments $Self Care/Home Management : 8-22 mins  Kathyrn Drown Chandrea Zellman, OTR/L 03/24/20, 12:40 PM

## 2020-03-24 NOTE — Plan of Care (Signed)
  Problem: Urinary Elimination: Goal: Signs and symptoms of infection will decrease Outcome: Progressing   Problem: Fluid Volume: Goal: Hemodynamic stability will improve Outcome: Progressing   

## 2020-03-24 NOTE — Progress Notes (Addendum)
Daily Progress Note   Patient Name: [name]       Date: 03/24/2020 DOB: 1966-02-12  Age: 54 y.o. MRN#: [id number] Attending Physician: Lynn Ito, MD Primary Care Physician: [facility] Admit Date: 03/19/2020  Reason for Consultation/Follow-up: Establishing goals of care  Subjective: Patient is resting in bed. She tells me her name, that she is at the hospital, the year of 2021. She states she is okay with going to SNF. She states she wants to continue to treat the treatable. She states she would not want a feeding tube, to be placed on a ventilator, or to have CPR. Husband and CM in to bedside and patient immediately became tearful and stated she wants to go home. Husband states he wants everything done including CPR, stating if it works it works, and if it does not, it does not. He states his wife would want everything done that could be, and is not thinking as she usually would. OT cognitive evaluation completed. Due to concern for impaired cognition, recommend palliative to follow at facility for continued conversation.   Length of Stay: 5  Current Medications: Scheduled Meds:  . aspirin EC  81 mg Oral Daily  . atorvastatin  40 mg Oral q1800  . Chlorhexidine Gluconate Cloth  6 each Topical Daily  . clopidogrel  75 mg Oral Daily  . darifenacin  7.5 mg Oral Daily  . divalproex  250 mg Oral Daily  . divalproex  500 mg Oral QHS  . enoxaparin (LOVENOX) injection  40 mg Subcutaneous Q12H  . insulin aspart  0-9 Units Subcutaneous Q4H  . insulin detemir  25 Units Subcutaneous Q2200  . lisinopril  40 mg Oral Daily  . metoprolol tartrate  25 mg Oral BID  . saccharomyces boulardii  250 mg Oral BID  . sodium chloride flush  10-40 mL Intracatheter Q12H    Continuous  Infusions:   PRN Meds: acetaminophen **OR** acetaminophen, albuterol, ALPRAZolam, hydrALAZINE, LORazepam, sodium chloride flush  Physical Exam Pulmonary:     Effort: Pulmonary effort is normal.  Neurological:     Mental Status: She is alert.             Vital Signs: BP (!) 155/77 (BP Location: Right Arm)   Pulse 87   Temp 98 F (36.7 C) (Oral)  Resp 16   Ht 5\' 2"  (1.575 m)   Wt 120.2 kg   SpO2 97%   BMI 48.47 kg/m  SpO2: SpO2: 97 % O2 Device: O2 Device: Room Air O2 Flow Rate: O2 Flow Rate (L/min): 2 L/min  Intake/output summary:   Intake/Output Summary (Last 24 hours) at 03/24/2020 1616 Last data filed at 03/24/2020 0950 Gross per 24 hour  Intake 240 ml  Output --  Net 240 ml   LBM: Last BM Date: 03/23/20 Baseline Weight: Weight: 110 kg Most recent weight: Weight: 120.2 kg        Patient Active Problem List   Diagnosis Date Noted  . UTI (urinary tract infection) 03/19/2020  . Distal radial fracture_left 03/19/2020  . Stroke (HCC)   . Acute respiratory failure with hypoxia (HCC)   . Sepsis (HCC)   . Pneumonia of left lower lobe due to infectious organism   . Iron deficiency anemia   . Shortness of breath   . Lobar pneumonia (HCC)   . Weakness 12/20/2019  . Hemiparesis affecting left side as late effect of cerebrovascular accident (CVA) (HCC) 12/20/2019  . Type 2 diabetes mellitus with hyperlipidemia (HCC) 12/20/2019  . Seizure disorder as sequela of cerebrovascular accident (HCC) 12/20/2019  . Urinary incontinence 12/20/2019  . Self-care deficit 12/20/2019  . Fall   . Lacunar stroke, acute (HCC) 07/24/2019  . Seizure (HCC) 07/24/2019  . Elevated troponin 07/24/2019  . AKI (acute kidney injury) (HCC) 07/24/2019  . Acute metabolic encephalopathy 07/23/2019  . Cerebral infarction (HCC) 08/14/2016  . Recurrent cerebrovascular accidents (CVAs) (HCC) 08/14/2016  . Delirium due to another medical condition 08/13/2016  . Urinary tract infection 08/13/2016  .  Right foot infection 05/11/2016  . Foot abscess, right 05/11/2016  . Anxiety and depression 12/02/2015  . CVA (cerebral infarction) 11/27/2015  . Epilepsy (HCC) 06/17/2015  . 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 Headache 05/19/2015  . Weakness of left side of body 05/19/2015  . History of cardioembolic cerebrovascular accident (CVA) 05/19/2015  . Morbid obesity with BMI of 40.0-44.9, adult (HCC) 03/19/2014  . HTN (hypertension)   . Diabetes mellitus without complication (HCC)   . Hyperlipidemia   . [name] (transient ischemic attack) 03/17/2014    Palliative Care Assessment & Plan   Recommendations/Plan: Recommend palliative at D/C.     Code Status:    Code Status Orders  (From admission, onward)         Start     Ordered   03/19/20 1022  Full code  Continuous        03/19/20 1021        Code Status History    Date Active Date Inactive Code Status Order ID Comments User Context   02/24/2020 2240 03/03/2020 1945 Full Code 03/05/2020  [id number], MD ED   12/20/2019 2352 12/22/2019 2046 Full Code 2047  [id number], MD ED   07/23/2019 1759 07/25/2019 1954 Full Code 13/03/2019  [id number], MD ED   04/16/2019 1554 04/17/2019 1746 Full Code 04/19/2019  [id number], MD Inpatient   08/14/2016 2201 08/16/2016 2245 Full Code 08/18/2016  [id number], MD Inpatient   05/11/2016 1729 05/15/2016 1514 Full Code 05/17/2016  [id number], MD Inpatient   11/27/2015 1235 11/28/2015 1626 Full Code 11/30/2015  [id number], MD ED   06/14/2015 0620 06/15/2015 2214 Full Code 2215  [id number], MD Inpatient   05/19/2015 0111 05/19/2015 2041 Full Code 2042  [id number], MD Inpatient   Advance  Care Planning Activity      Prognosis:  Unable to determine   Thank you for allowing the Palliative Medicine Team to assist in the care of this patient.   Total Time 15 min Prolonged Time Billed no      Greater than 50%  of this time was spent counseling and  coordinating care related to the above assessment and plan.  Morton Stall, NP  Please contact Palliative Medicine Team phone at [phone] for questions and concerns.

## 2020-03-25 LAB — BASIC METABOLIC PANEL
Anion gap: 9 (ref 5–15)
BUN: 21 mg/dL — ABNORMAL HIGH (ref 6–20)
CO2: 27 mmol/L (ref 22–32)
Calcium: 8.9 mg/dL (ref 8.9–10.3)
Chloride: 104 mmol/L (ref 98–111)
Creatinine, Ser: 0.92 mg/dL (ref 0.44–1.00)
GFR calc Af Amer: >60 mL/min (ref >60)
GFR calc non Af Amer: >60 mL/min (ref >60)
Glucose, Bld: 226 mg/dL — ABNORMAL HIGH (ref 70–99)
Potassium: 4.1 mmol/L (ref 3.5–5.1)
Sodium: 140 mmol/L (ref 135–145)

## 2020-03-25 LAB — GLUCOSE, CAPILLARY
Glucose-Capillary: 239 mg/dL — ABNORMAL HIGH (ref 70–99)
Glucose-Capillary: 216 mg/dL — ABNORMAL HIGH (ref 70–99)
Glucose-Capillary: 238 mg/dL — ABNORMAL HIGH (ref 70–99)
Glucose-Capillary: 274 mg/dL — ABNORMAL HIGH (ref 70–99)
Glucose-Capillary: 228 mg/dL — ABNORMAL HIGH (ref 70–99)

## 2020-03-25 MED ORDER — INSULIN DETEMIR 100 UNIT/ML ~~LOC~~ SOLN
30.0000 [IU] | Freq: Every day | SUBCUTANEOUS | Status: DC
  Administered 2020-03-25 – 2020-03-27 (×3): 30 [IU] via SUBCUTANEOUS
  Filled 2020-03-25 (×4): qty 0.3

## 2020-03-25 NOTE — Plan of Care (Signed)
Pt denies pain this shift. Compliant with care. Problem: Fluid Volume: Goal: Hemodynamic stability will improve Outcome: Progressing   Problem: Clinical Measurements: Goal: Diagnostic test results will improve Outcome: Progressing Goal: Signs and symptoms of infection will decrease Outcome: Progressing   Problem: Education: Goal: Knowledge of General Education information will improve Description: Including pain rating scale, medication(s)/side effects and non-pharmacologic comfort measures Outcome: Progressing   Problem: Health Behavior/Discharge Planning: Goal: Ability to manage health-related needs will improve Outcome: Progressing   Problem: Clinical Measurements: Goal: Ability to maintain clinical measurements within normal limits will improve Outcome: Progressing Goal: Will remain free from infection Outcome: Progressing Goal: Diagnostic test results will improve Outcome: Progressing Goal: Respiratory complications will improve Outcome: Progressing Goal: Cardiovascular complication will be avoided Outcome: Progressing   Problem: Activity: Goal: Risk for activity intolerance will decrease Outcome: Progressing   Problem: Nutrition: Goal: Adequate nutrition will be maintained Outcome: Progressing   Problem: Coping: Goal: Level of anxiety will decrease Outcome: Progressing   Problem: Elimination: Goal: Will not experience complications related to bowel motility Outcome: Progressing Goal: Will not experience complications related to urinary retention Outcome: Progressing   Problem: Pain Managment: Goal: General experience of comfort will improve Outcome: Progressing   Problem: Safety: Goal: Ability to remain free from injury will improve Outcome: Progressing   Problem: Skin Integrity: Goal: Risk for impaired skin integrity will decrease Outcome: Progressing   Problem: Education: Goal: Knowledge of General Education information will improve Description:  Including pain rating scale, medication(s)/side effects and non-pharmacologic comfort measures Outcome: Progressing   Problem: Health Behavior/Discharge Planning: Goal: Ability to manage health-related needs will improve Outcome: Progressing   Problem: Clinical Measurements: Goal: Ability to maintain clinical measurements within normal limits will improve Outcome: Progressing Goal: Will remain free from infection Outcome: Progressing Goal: Diagnostic test results will improve Outcome: Progressing Goal: Respiratory complications will improve Outcome: Progressing Goal: Cardiovascular complication will be avoided Outcome: Progressing   Problem: Urinary Elimination: Goal: Signs and symptoms of infection will decrease Outcome: Progressing

## 2020-03-25 NOTE — Progress Notes (Signed)
Inpatient Diabetes Program Recommendations  AACE/ADA: New Consensus Statement on Inpatient Glycemic Control   Target Ranges:  Prepandial:   less than 140 mg/dL      Peak postprandial:   less than 180 mg/dL (1-2 hours)      Critically ill patients:  140 - 180 mg/dL   Results for [name], [name] (MRN [id number]) as of 03/25/2020 12:30  Ref. Range 03/24/2020 04:15 03/24/2020 07:38 03/24/2020 12:01 03/24/2020 15:47 03/24/2020 20:29 03/25/2020 03:47 03/25/2020 07:51  Glucose-Capillary Latest Ref Range: 70 - 99 mg/dL 035 (H) 597 (H) 416 (H) 201 (H) 286 (H) 239 (H) 216 (H)   Review of Glycemic Control  Diabetes history:DM2 Outpatient Diabetes medications:Levemir 35 units QHS, Novolog 15 units TID with meals, Metformin XR 500 mg BID Current orders for Inpatient glycemic control:Levemir 25 units QHS, Novolog 0-9 units Q4H  Inpatient Diabetes Program Recommendations:  Insulin- Please consider increasing Levemir to 30 units QHS and adding Novolog 4 units TID with meals for meal coverage if patient eats at least 50% of meals.  Thanks, Orlando Penner, RN, MSN, CDE Diabetes Coordinator Inpatient Diabetes Program [phone] (Team Pager from 8am to 5pm)

## 2020-03-25 NOTE — Progress Notes (Signed)
PROGRESS NOTE    [name]  TIR:[id number] DOB: 11-Feb-1966 DOA: 03/19/2020 PCP: [facility]    Brief Narrative:  [name] a 54 y.o.femalewith medical history significant ofhypertension, hyperlipidemia, diabetes mellitus, stroke, [name], depression, anxiety, seizure,who presents with AMS and fall.   Per report, pt recently had fall and wasseen in ED 6/29, and found to have left distal radius fracture.Wrist splint was placed.Patientwasdischarged home with prescriptions for a low-dose oftylenol with codeine. Patient is to follow up withorthopedics.Per EDP, pt was noted to be confused and hasgeneralized weakness. Patient took offwrist splint.She felltwice due to her legs bucklingwhen her husbandwas trying to get her here to the hospital.He was able to catch her and she did not strike her head.Her husband didnot think patient accidentally took too many pain medicines. Concern of UTI, urine culture with insignificant growth.  Of note, patient has no IV access as she was noncooperative with central line which time to be placed yesterday. However as patient does not appear to have any acute infection, we have agreed to not place a PICC line unless need for IV access becomes urgent.    Consultants:   Palliative orthopedics  Procedures:   Antimicrobials:       Subjective: Denies any chest pain, shortness of breath, nausea vomiting  Objective: Vitals:   03/24/20 0735 03/24/20 1546 03/24/20 2331 03/25/20 0751  BP: (!) 155/92 (!) 155/77 (!) 154/73 (!) 146/64  Pulse: 88 87 94 85  Resp: 17 16 20 18   Temp: 98.1 F (36.7 C) 98 F (36.7 C) 98.7 F (37.1 C) 98.6 F (37 C)  TempSrc: Oral Oral Axillary Oral  SpO2: 99% 97% 100% 100%  Weight:      Height:        Intake/Output Summary (Last 24 hours) at 03/25/2020 0853 Last data filed at 03/25/2020 0400 Gross per 24 hour  Intake 360 ml  Output 700 ml  Net -340 ml   Filed Weights    03/19/20 0435 03/20/20 0300  Weight: 110 kg 120.2 kg    Examination:  General exam: Comfortable, calm, laying in bed Respiratory system: CTA, no wheeze rales rhonchi's  cardiovascular system: Regular S1 & S2 heard, no M/R/G Gastrointestinal system: Abdomen is nondistended, soft and nontender. Normal bowel sounds heard. Central nervous system: Grossly intact Extremities: No edema Skin: Warm dry Psychiatry: Mood appropriate in current setting    Data Reviewed: I have personally reviewed following labs and imaging studies  CBC: Recent Labs  Lab 03/19/20 0458 03/20/20 0507 03/21/20 0857 03/23/20 0737  WBC 6.0 4.5 5.3 6.1  NEUTROABS 3.1  --   --   --   HGB 8.7* 8.2* 10.2* 9.0*  HCT 27.3* 25.8* 31.6* 27.7*  MCV 63.5* 62.9* 62.7* 62.0*  PLT 189 179 205 180   Basic Metabolic Panel: Recent Labs  Lab 03/19/20 0458 03/20/20 0507 03/21/20 0857 03/23/20 0737 03/24/20 0601  NA 141 142 140 140 139  K 4.0 3.6 3.5 3.7 4.1  CL 105 105 103 104 104  CO2 26 28 26 26 28   GLUCOSE 151* 145* 170* 202* 209*  BUN 27* 18 10 24* 23*  CREATININE 1.36* 0.87 0.83 1.23* 1.11*  CALCIUM 8.8* 8.6* 9.1 8.7* 8.8*   GFR: Estimated Creatinine Clearance: 71.4 mL/min (A) (by C-G formula based on SCr of 1.11 mg/dL (H)). Liver Function Tests: Recent Labs  Lab 03/19/20 0458  AST 20  ALT 13  ALKPHOS 31*  BILITOT 0.9  PROT 7.1  ALBUMIN 3.5  No results for input(s): LIPASE, AMYLASE in the last 168 hours. No results for input(s): AMMONIA in the last 168 hours. Coagulation Profile: No results for input(s): INR, PROTIME in the last 168 hours. Cardiac Enzymes: No results for input(s): CKTOTAL, CKMB, CKMBINDEX, TROPONINI in the last 168 hours. BNP (last 3 results) No results for input(s): PROBNP in the last 8760 hours. HbA1C: No results for input(s): HGBA1C in the last 72 hours. CBG: Recent Labs  Lab 03/24/20 1201 03/24/20 1547 03/24/20 2029 03/25/20 0347 03/25/20 0751  GLUCAP 221* 201*  286* 239* 216*   Lipid Profile: No results for input(s): CHOL, HDL, LDLCALC, TRIG, CHOLHDL, LDLDIRECT in the last 72 hours. Thyroid Function Tests: No results for input(s): TSH, T4TOTAL, FREET4, T3FREE, THYROIDAB in the last 72 hours. Anemia Panel: No results for input(s): VITAMINB12, FOLATE, FERRITIN, TIBC, IRON, RETICCTPCT in the last 72 hours. Sepsis Labs: Recent Labs  Lab 03/19/20 0458  LATICACIDVEN 2.1*    Recent Results (from the past 240 hour(s))  Urine culture     Status: Abnormal   Collection Time: 03/19/20  5:07 AM   Specimen: Urine, Catheterized  Result Value Ref Range Status   Specimen Description   Final    URINE, CATHETERIZED Performed at [facility], [address] [place], [place] [address]    Special Requests   Final    NONE Performed at [facility], [address] [place], [place] [address]    Culture (A)  Final    <10,000 COLONIES/mL INSIGNIFICANT GROWTH Performed at [facility], [address] [place], [place] [address]    Report Status 03/20/2020 FINAL  Final  SARS Coronavirus 2 by RT PCR (hospital order, performed in [facility] hospital lab) Nasopharyngeal Nasopharyngeal Swab     Status: None   Collection Time: 03/19/20  5:20 AM   Specimen: Nasopharyngeal Swab  Result Value Ref Range Status   SARS Coronavirus 2 NEGATIVE NEGATIVE Final    Comment: (NOTE) SARS-CoV-2 target nucleic acids are NOT DETECTED.  The SARS-CoV-2 RNA is generally detectable in upper and lower respiratory specimens during the acute phase of infection. The lowest concentration of SARS-CoV-2 viral copies this assay can detect is 250 copies / mL. A negative result does not preclude SARS-CoV-2 infection and should not be used as the sole basis for treatment or other patient management decisions.  A negative result may occur with improper specimen collection / handling, submission of specimen other than nasopharyngeal swab, presence of viral  mutation(s) within the areas targeted by this assay, and inadequate number of viral copies (<250 copies / mL). A negative result must be combined with clinical observations, patient history, and epidemiological information.  Fact Sheet for Patients:   BoilerBrush.com.cy  Fact Sheet for Healthcare Providers: https://pope.com/  This test is not yet approved or  cleared by the [place] FDA and has been authorized for detection and/or diagnosis of SARS-CoV-2 by FDA under an Emergency Use Authorization (EUA).  This EUA will remain in effect (meaning this test can be used) for the duration of the COVID-19 declaration under Section 564(b)(1) of the Act, 21 U.S.C. section 360bbb-3(b)(1), unless the authorization is terminated or revoked sooner.  Performed at [facility], [address] [place], [place] [address]   Culture, blood (Routine X 2) w Reflex to ID Panel     Status: None (Preliminary result)   Collection Time: 03/21/20  8:57 AM   Specimen: BLOOD RIGHT HAND  Result Value Ref Range Status  Specimen Description BLOOD RIGHT HAND  Final   Special Requests   Final    BOTTLES DRAWN AEROBIC AND ANAEROBIC Blood Culture adequate volume   Culture   Final    NO GROWTH 4 DAYS Performed at [facility], [address], [place], [place] [address]    Report Status PENDING  Incomplete  Culture, blood (Routine X 2) w Reflex to ID Panel     Status: None (Preliminary result)   Collection Time: 03/21/20 10:18 AM   Specimen: BLOOD  Result Value Ref Range Status   Specimen Description BLOOD BLOOD RIGHT HAND  Final   Special Requests   Final    BOTTLES DRAWN AEROBIC ONLY Blood Culture results may not be optimal due to an inadequate volume of blood received in culture bottles   Culture   Final    NO GROWTH 4 DAYS Performed at [facility], [address], [place], [place] [address]    Report Status PENDING   Incomplete         Radiology Studies: No results found.      Scheduled Meds:  aspirin EC  81 mg Oral Daily   atorvastatin  40 mg Oral q1800   Chlorhexidine Gluconate Cloth  6 each Topical Daily   clopidogrel  75 mg Oral Daily   darifenacin  7.5 mg Oral Daily   divalproex  250 mg Oral Daily   divalproex  500 mg Oral QHS   enoxaparin (LOVENOX) injection  40 mg Subcutaneous Q12H   insulin aspart  0-9 Units Subcutaneous Q4H   insulin detemir  25 Units Subcutaneous Q2200   lisinopril  40 mg Oral Daily   metoprolol tartrate  25 mg Oral BID   saccharomyces boulardii  250 mg Oral BID   sodium chloride flush  10-40 mL Intracatheter Q12H   Continuous Infusions:  Assessment & Plan:   Principal Problem:   UTI (urinary tract infection) Active Problems:   HTN (hypertension)   Diabetes mellitus without complication (HCC)   Hyperlipidemia   Acute metabolic encephalopathy   Seizure (HCC)   Elevated troponin   AKI (acute kidney injury) (HCC)   Fall   Iron deficiency anemia   Stroke (HCC)   Acute respiratory failure with hypoxia (HCC)   Distal radial fracture_left   Altered mental status/falls.  Per husband she is at her baseline.  Patient with declining health and cognitive impairment since her prior strokes. Husband works full-time and unable to provide 24/7 assistance as patient is becoming more and more dependent for all of her ADLs. -PT is recommending SNF placement.  TOC is working on it. -Patient needs long-term care facility-had a bed offer, working on English as a second language teacher. -Due to persistent declining health palliative care was consulted as patient remains full code with very poor functional status. Palliative's input was appreciated please see note     Abnormal UA.  Patient remained afebrile with no leukocytosis.  Urine culture with insignificant growth.  Provider over the weekend discussed with Dr. Algis Liming of ID and he advised blood cultures as  she has a prior history of urine cultures positive for Granulicatella whichis a fastidious organism that can sometimes rarely be associated with endocarditis. -No need for antibiotics at this time. -Blood cultures negative.  Nursing concern about aspiration.  Formal swallow evaluation was obtained, patient with oropharyngeal phase dysphagia and they were recommending dysphagia 3 diet with nectar thick consistency liquids. -Keep head elevated at 30 degree.  Left distal radial fracture.  Secondary to  mechanical fall. -Patient took off her splint.  There was significant edema of left hand and wrist.  Orthopedic was reconsulted at husband's request as patient will be going to a long-term facility. -Dr. Joice LoftsPoggi saw the patient and advised soft immobilizer due to significant edema and an outpatient follow-up in 7 to 10 days for a possible replacement of short arm cast. -Continue Tylenol for pain management-we will try avoiding narcotics. Plan: If pt remains here next week will have ortho f/u with pt here.  Essential hypertension.   More stable. Continue metoprolol and lisinopril  AKI.   Resolved with IV fluids  Encourage p.o. intake  Creatinine now 0.92  Type 2 diabetes mellitus without complications. Patient was on Metformin at home.   Blood glucose levels mildly elevated- Continue RISS Plan: Increase Levemir to 30U bid   Seizure disorder.  No acute concern. Continue with Depakote  -Continue seizure precautions  History of CVA. -Continue home dose of aspirin, Plavix and Lipitor.   DVT prophylaxis: Lovenox Code Status: Full Family Communication: None at bedside Disposition Plan: SNF Status is: Inpatient  Remains inpatient appropriate because:Unsafe d/c plan   Dispo: The patient is from: home              Anticipated d/c is to: SNF              Anticipated d/c date is: 3 days              Patient currently is not medically stable to d/c.Needs long term facility,  authorization pending.             LOS: 6 days   Time spent:2945min with >50%on coc    Lynn ItoSahar Jean Alejos, MD [facility] Pager [phone]  If 7PM-7AM, please contact night-coverage [email or web address] Password [email or web address] 03/25/2020, 8:53 AM

## 2020-03-25 NOTE — Evaluation (Signed)
Clinical/Bedside Swallow Evaluation Patient Details  Name: [name] MRN: [id number] Date of Birth: March 17, 1966  Today's Date: 03/25/2020 Time: SLP Start Time (ACUTE ONLY): 1456 SLP Stop Time (ACUTE ONLY): 1550 SLP Time Calculation (min) (ACUTE ONLY): 54 min  Past Medical History:  Past Medical History:  Diagnosis Date  . Allergic rhinitis   . Chickenpox   . Depression   . Diabetes (HCC)   . Epilepsy (HCC)   . Headache   . HTN (hypertension)   . Hyperlipidemia   . Seizures (HCC)   . Stroke (HCC)   . Urinary incontinence    Past Surgical History:  Past Surgical History:  Procedure Laterality Date  . CESAREAN SECTION    . INCISION AND DRAINAGE Right 05/13/2016   Procedure: INCISION AND DRAINAGE;  Surgeon: Gwyneth Revels, DPM;  Location: [facility] ORS;  Service: Podiatry;  Laterality: Right;   HPI:  Pt is a 54 y.o. female with past history of Multiple medical issues including Morbid Obesity, multiple Strokes (03/2014;  11/2015; 07/2019), history of hypertension, diabetes mellitus, hyperlipidemia, depression, epilepsy, hyperlipidemia. Pt with previous ST cognitive linguistic evaluation 08/14/2016 which identified moderate cognitive deficits in the areas of problem solving, task initiation, awareness and attention.  Pt's husband stated she requires consistent assistance w/ all ADLs at home.  Husband endorsed last admit that pt could get choked at home when she "ate too much", too fast.  He also endorsed that she "eats while sleeping in bed".  Per recent chart notes, pt recently had fall and was seen in ED 6/29, and found to have left distal radius fracture. Wrist splint was placed. Patient was discharged home.  Pt was admitted this admit to the ED w/ dx of UTI.  During this admission, pt has exhibited min confusion, agitated and uncooperative then drowsiness in the past 1-2 days.  Per MD note, there is concern of pt appearing to "aspirate off of her own saliva when laying back less than 30 degrees and  when taking sips of regular water".  Patient's meds have been Crushed since w/ applesauce and nectar thickened fluids given which MD noted have been tolerated "very well".  (this was the same recommendation last admit ~3 weeks ago).     Assessment / Plan / Recommendation Clinical Impression  Pt seen for a re-evaluation today per NSG request. She appears to present w/ oropharyngeal phase dysphagia, and a similar presentation as at previous assessments this admission and ~3 weeks ago. Pt admitted again to this Hospital w/ dx of UTI and encephalopathy; pt has a h/o multiple CVAs w/ Cognitive-linguistic decline per chart notes/assessments prior. Any decline in Neurological status or Cognitive awareness can increase risk for Dysphagia thus increase risk for Aspiration w/ oral intake. This has been noted in the previous admit/assessments, and a Dysphagia diet w/ thickened liquids has been recommended w/ aspiration precautions. A conservative approach should be taken. Per NSG report today, pt has been noted to exhibit overt s/s of aspiration during meals despite being on a Dysphagia diet. During this re-evaluation, pt consumed trials of Nectar liquids via cup, purees, and minced/softened solids w/ No overt, clinical s/s of aspiration noted -- vocal quality was clear when assessed intermittently; no decline in respiratory effort during/post trials. Pharyngeal swallowing appeared timely and laryngeal excursion grossly wfl upon palpation. Oral phase c/b adequate bolus acceptance and management of Nectar liquids and purees; min increased oral phase time for mastication and oral clearing w/ increased textured foods -- noted more of a munching pattern  w/ smacking during mastication of softened solids vs rotary chewing. Given Time and alternating w/ moist food/puree, pt was able to clear adequately b/t trials of solids. Pt is missing some Dentition, molars. Feeding Support given; verbal cues and model as pt was also noted to  drink somewhat Impulsively w/ the Nectar liquids. D/t pt's overall presentation and the focus to reduce risk of aspiration w/ oral intake and reduce risk of pulmonary decline, recommend continue a modified diet of Dysphagia level 2 (MINCED foods w/ gravies) w/ Nectar consistency liquids; general aspiration and Reflux precautions. Assistance at meals for support and monitoring safety; follow through w/ precautions - lessen Impulsive drinking behaviors. Recommend Pills given in Puree for safer swallowing d/t Cognitive awareness decline. Pt should continue ongoing ST services at next venue of care for education and toleration of diet; trials to upgrade diet further IF appropriate in the future. Education re: precautions given to NSG; NSG/MD updated.  SLP Visit Diagnosis: Dysphagia, oropharyngeal phase (R13.12) (declined Cognitive awareness)    Aspiration Risk  Mild aspiration risk;Moderate aspiration risk;Risk for inadequate nutrition/hydration (appears pt is at Baseline)    Diet Recommendation  Continue a Dysphagia level 2 (MINCED foods w/ gravies) and NECTAR liquids. Aspiration precautions. Reflux precautions. Support w/ feeding at meals -- Monitor for Impulsive feeding behaviors when Drinking liquids (pt should hold Cup herself to drink - NO Straws)  Medication Administration: Whole meds with puree (vs need to Crush for ease of swallowing)    Other  Recommendations Recommended Consults:  (Dietician f/u) Oral Care Recommendations: Oral care BID;Oral care before and after PO;Staff/trained caregiver to provide oral care Other Recommendations: Order thickener from pharmacy;Prohibited food (jello, ice cream, thin soups);Remove water pitcher;Have oral suction available   Follow up Recommendations Skilled Nursing facility (LTC per SW)      Frequency and Duration  (n/a)   (n/a)       Prognosis Prognosis for Safe Diet Advancement: Fair Barriers to Reach Goals: Cognitive deficits;Time post  onset;Severity of deficits (Dysphagia)      Swallow Study   General Date of Onset: 03/19/20 HPI: Pt is a 54 y.o. female with past history of Multiple medical issues including Morbid Obesity, multiple Strokes (03/2014;  11/2015; 07/2019), history of hypertension, diabetes mellitus, hyperlipidemia, depression, epilepsy, hyperlipidemia. Pt with previous ST cognitive linguistic evaluation 08/14/2016 which identified moderate cognitive deficits in the areas of problem solving, task initiation, awareness and attention.  Pt's husband stated she requires consistent assistance w/ all ADLs at home.  Husband endorsed last admit that pt could get choked at home when she "ate too much", too fast.  He also endorsed that she "eats while sleeping in bed".  Per recent chart notes, pt recently had fall and was seen in ED 6/29, and found to have left distal radius fracture. Wrist splint was placed. Patient was discharged home.  Pt was admitted this admit to the ED w/ dx of UTI.  During this admission, pt has exhibited min confusion, agitated and uncooperative then drowsiness in the past 1-2 days.  Per MD note, there is concern of pt appearing to "aspirate off of her own saliva when laying back less than 30 degrees and when taking sips of regular water".  Patient's meds have been Crushed since w/ applesauce and nectar thickened fluids given which MD noted have been tolerated "very well".  (this was the same recommendation last admit ~3 weeks ago).   Type of Study: Bedside Swallow Evaluation Previous Swallow Assessment: 03/02/2020; rec'd dysphagia level  2 w/ Nectar liquids at that d/c then. seen on 03/22/2020 (this admit) and rec'd to remain on Dys. 2 w/ Nectar liquids. NSG has reported Coughing today and requested a reassessment.  Diet Prior to this Study: Dysphagia 2 (chopped);Nectar-thick liquids Temperature Spikes Noted: No (wbc 6.1) Respiratory Status: Room air History of Recent Intubation: No Behavior/Cognition:  Alert;Cooperative;Pleasant mood;Distractible;Requires cueing (min verbal) Oral Cavity Assessment: Within Functional Limits Oral Care Completed by SLP: Recent completion by staff Oral Cavity - Dentition: Poor condition;Missing dentition (front teeth) Vision: Functional for self-feeding (support w/ feeding foods) Self-Feeding Abilities: Able to feed self;Needs assist;Needs set up (full assist) Patient Positioning: Upright in bed (needed full assist to sit more upright ) Baseline Vocal Quality: Low vocal intensity (few words of engagement) Volitional Cough:  (adequate) Volitional Swallow: Able to elicit    Oral/Motor/Sensory Function Overall Oral Motor/Sensory Function: Generalized oral weakness (munching/smacking behavior during mastication) Facial ROM: Within Functional Limits Facial Symmetry: Within Functional Limits Lingual ROM: Within Functional Limits Lingual Symmetry: Within Functional Limits Lingual Strength: Within Functional Limits Mandible: Within Functional Limits   Ice Chips Ice chips: Not tested   Thin Liquid Thin Liquid: Not tested    Nectar Thick Nectar Thick Liquid: Within functional limits (grossly) Presentation: Cup;Self Fed (supported; 4 ozs) Other Comments: pt was impulsive when drinking - held cup herself but needed cues to Slow down, take smaller sips   Honey Thick Honey Thick Liquid: Not tested   Puree Puree: Within functional limits Presentation: Spoon (fed; 8 trials)   Solid     Solid: Impaired (mech soft-minced trials) Presentation: Spoon (fed; 6 trials) Oral Phase Impairments: Impaired mastication (munching/smacking pattern) Oral Phase Functional Implications: Impaired mastication (missing Dentition) Pharyngeal Phase Impairments:  (none)       Jerilynn Som, MS, CCC-SLP [facility] Detter 03/25/2020,3:55 PM

## 2020-03-25 NOTE — TOC Progression Note (Signed)
Transition of Care [facility]) - Progression Note    Patient Details  Name: [name] MRN: [id number] Date of Birth: [birth year]  Transition of Care [facility]) CM/SW Contact  Barrie Dunker, RN Phone Number: 03/25/2020, 9:21 AM  Clinical Narrative:     Spoke with Tammy at [facility] and checked on the status of medicaid approval it is still pending, she will let me know once approved       Expected Discharge Plan and Services                                                 Social Determinants of Health (SDOH) Interventions    Readmission Risk Interventions Readmission Risk Prevention Plan 07/25/2019  Post Dischage Appt Complete  Medication Screening Complete  Transportation Screening Complete  Some recent data might be hidden

## 2020-03-25 NOTE — Plan of Care (Signed)
Ax0x3, verbal no c/o pain. Purewick in place amber urine in canister remains on room air, pt has not been out of bed since admission states she is afraid. Will add PT consult if not already in place. Minced meat diet with good appetite but pt coughs while eating. Another SLP eval was ordered. Pt c/o constipation but LBM documented on 03/24/20. Will continue with current care plan. Will continue to monitor

## 2020-03-26 LAB — GLUCOSE, CAPILLARY
Glucose-Capillary: 148 mg/dL — ABNORMAL HIGH (ref 70–99)
Glucose-Capillary: 149 mg/dL — ABNORMAL HIGH (ref 70–99)
Glucose-Capillary: 170 mg/dL — ABNORMAL HIGH (ref 70–99)
Glucose-Capillary: 181 mg/dL — ABNORMAL HIGH (ref 70–99)
Glucose-Capillary: 212 mg/dL — ABNORMAL HIGH (ref 70–99)
Glucose-Capillary: 340 mg/dL — ABNORMAL HIGH (ref 70–99)

## 2020-03-26 LAB — CULTURE, BLOOD (ROUTINE X 2)
Culture: NO GROWTH
Culture: NO GROWTH
Special Requests: ADEQUATE

## 2020-03-26 NOTE — Progress Notes (Signed)
PROGRESS NOTE    [name]  WUJ:[id number] DOB: 10/12/1965 DOA: 03/19/2020 [facility]CP: [facility]    Brief Narrative:  [name] a 54 y.o.femalewith medical history significant ofhypertension, hyperlipidemia, diabetes mellitus, stroke, [name], depression, anxiety, seizure,who presents with AMS and fall.   Per report, pt recently had fall and wasseen in ED 6/29, and found to have left distal radius fracture.Wrist splint was placed.Patientwasdischarged home with prescriptions for a low-dose oftylenol with codeine. Patient is to follow up withorthopedics.Per EDP, pt was noted to be confused and hasgeneralized weakness. Patient took offwrist splint.She felltwice due to her legs bucklingwhen her husbandwas trying to get her here to the hospital.He was able to catch her and she did not strike her head.Her husband didnot think patient accidentally took too many pain medicines. Concern of UTI, urine culture with insignificant growth.  Of note, patient has no IV access as she was noncooperative with central line which time to be placed yesterday. However as patient does not appear to have any acute infection, we have agreed to not place a PICC line unless need for IV access becomes urgent.    Consultants:   Palliative orthopedics  Procedures:   Antimicrobials:       Subjective:  Patient lying in bed with eyes closed refuses to open to answer my question just sleeping not really participating with physical exam  Objective: Vitals:   03/24/20 2331 03/25/20 0751 03/25/20 1640 03/26/20 0019  BP: (!) 154/73 (!) 146/64 (!) 156/54 (!) 142/71  Pulse: 94 85 95 90  Resp: 20 18 18 16   Temp: 98.7 F (37.1 C) 98.6 F (37 C) 97.9 F (36.6 C) 98.4 F (36.9 C)  TempSrc: Axillary Oral Oral Oral  SpO2: 100% 100% 100% 97%  Weight:      Height:        Intake/Output Summary (Last 24 hours) at 03/26/2020 0753 Last data filed at 03/26/2020 0300 Gross per  24 hour  Intake --  Output 400 ml  Net -400 ml   Filed Weights   03/19/20 0435 03/20/20 0300  Weight: 110 kg 120.2 kg    Examination:  General exam: Calm and comfortable, NAD not participating with physical exam Respiratory system: CTA, no W/R/R cardiovascular system: Regular, S1-S2 no murmurs  Gastrointestinal system: Soft, NT, ND, positive bowel sounds Central nervous system: Unable to assess as patient is not participating in exam Extremities: No edema or cyanosis Skin: Warm dry Psychiatry: Unable to assess    Data Reviewed: I have personally reviewed following labs and imaging studies  CBC: Recent Labs  Lab 03/20/20 0507 03/21/20 0857 03/23/20 0737  WBC 4.5 5.3 6.1  HGB 8.2* 10.2* 9.0*  HCT 25.8* 31.6* 27.7*  MCV 62.9* 62.7* 62.0*  PLT 179 205 180   Basic Metabolic Panel: Recent Labs  Lab 03/20/20 0507 03/21/20 0857 03/23/20 0737 03/24/20 0601 03/25/20 0840  NA 142 140 140 139 140  K 3.6 3.5 3.7 4.1 4.1  CL 105 103 104 104 104  CO2 28 26 26 28 27   GLUCOSE 145* 170* 202* 209* 226*  BUN 18 10 24* 23* 21*  CREATININE 0.87 0.83 1.23* 1.11* 0.92  CALCIUM 8.6* 9.1 8.7* 8.8* 8.9   GFR: Estimated Creatinine Clearance: 86.2 mL/min (by C-G formula based on SCr of 0.92 mg/dL). Liver Function Tests: No results for input(s): AST, ALT, ALKPHOS, BILITOT, PROT, ALBUMIN in the last 168 hours. No results for input(s): LIPASE, AMYLASE in the last 168 hours. No results for input(s):  AMMONIA in the last 168 hours. Coagulation Profile: No results for input(s): INR, PROTIME in the last 168 hours. Cardiac Enzymes: No results for input(s): CKTOTAL, CKMB, CKMBINDEX, TROPONINI in the last 168 hours. BNP (last 3 results) No results for input(s): PROBNP in the last 8760 hours. HbA1C: No results for input(s): HGBA1C in the last 72 hours. CBG: Recent Labs  Lab 03/25/20 1244 03/25/20 1640 03/25/20 2043 03/26/20 0022 03/26/20 0403  GLUCAP 238* 274* 228* 340* 212*    Lipid Profile: No results for input(s): CHOL, HDL, LDLCALC, TRIG, CHOLHDL, LDLDIRECT in the last 72 hours. Thyroid Function Tests: No results for input(s): TSH, T4TOTAL, FREET4, T3FREE, THYROIDAB in the last 72 hours. Anemia Panel: No results for input(s): VITAMINB12, FOLATE, FERRITIN, TIBC, IRON, RETICCTPCT in the last 72 hours. Sepsis Labs: No results for input(s): PROCALCITON, LATICACIDVEN in the last 168 hours.  Recent Results (from the past 240 hour(s))  Urine culture     Status: Abnormal   Collection Time: 03/19/20  5:07 AM   Specimen: Urine, Catheterized  Result Value Ref Range Status   Specimen Description   Final    URINE, CATHETERIZED Performed at [facility], [address], [place], [place] [address]    Special Requests   Final    NONE Performed at [facility], [address], [place], [place] [address]    Culture (A)  Final    <10,000 COLONIES/mL INSIGNIFICANT GROWTH Performed at [facility], [address], [place], [place] [address]    Report Status 03/20/2020 FINAL  Final  SARS Coronavirus 2 by RT PCR (hospital order, performed in [facility] hospital lab) Nasopharyngeal Nasopharyngeal Swab     Status: None   Collection Time: 03/19/20  5:20 AM   Specimen: Nasopharyngeal Swab  Result Value Ref Range Status   SARS Coronavirus 2 NEGATIVE NEGATIVE Final    Comment: (NOTE) SARS-CoV-2 target nucleic acids are NOT DETECTED.  The SARS-CoV-2 RNA is generally detectable in upper and lower respiratory specimens during the acute phase of infection. The lowest concentration of SARS-CoV-2 viral copies this assay can detect is 250 copies / mL. A negative result does not preclude SARS-CoV-2 infection and should not be used as the sole basis for treatment or other patient management decisions.  A negative result may occur with improper specimen collection / handling, submission of specimen other than nasopharyngeal swab, presence of viral  mutation(s) within the areas targeted by this assay, and inadequate number of viral copies (<250 copies / mL). A negative result must be combined with clinical observations, patient history, and epidemiological information.  Fact Sheet for Patients:   BoilerBrush.com.cy  Fact Sheet for Healthcare Providers: https://pope.com/  This test is not yet approved or  cleared by the [place] FDA and has been authorized for detection and/or diagnosis of SARS-CoV-2 by FDA under an Emergency Use Authorization (EUA).  This EUA will remain in effect (meaning this test can be used) for the duration of the COVID-19 declaration under Section 564(b)(1) of the Act, 21 U.S.C. section 360bbb-3(b)(1), unless the authorization is terminated or revoked sooner.  Performed at [facility], [address] [place], [place] [address]   Culture, blood (Routine X 2) w Reflex to ID Panel     Status: None   Collection Time: 03/21/20  8:57 AM   Specimen: BLOOD RIGHT HAND  Result Value Ref Range Status   Specimen Description BLOOD RIGHT HAND  Final   Special Requests   Final  BOTTLES DRAWN AEROBIC AND ANAEROBIC Blood Culture adequate volume   Culture   Final    NO GROWTH 5 DAYS Performed at [facility], [address] [place], [place] [address]    Report Status 03/26/2020 FINAL  Final  Culture, blood (Routine X 2) w Reflex to ID Panel     Status: None   Collection Time: 03/21/20 10:18 AM   Specimen: BLOOD  Result Value Ref Range Status   Specimen Description BLOOD BLOOD RIGHT HAND  Final   Special Requests   Final    BOTTLES DRAWN AEROBIC ONLY Blood Culture results may not be optimal due to an inadequate volume of blood received in culture bottles   Culture   Final    NO GROWTH 5 DAYS Performed at [facility], [address], [place], [place] [address]    Report Status 03/26/2020 FINAL  Final         Radiology  Studies: No results found.      Scheduled Meds: . aspirin EC  81 mg Oral Daily  . atorvastatin  40 mg Oral q1800  . Chlorhexidine Gluconate Cloth  6 each Topical Daily  . clopidogrel  75 mg Oral Daily  . darifenacin  7.5 mg Oral Daily  . divalproex  250 mg Oral Daily  . divalproex  500 mg Oral QHS  . enoxaparin (LOVENOX) injection  40 mg Subcutaneous Q12H  . insulin aspart  0-9 Units Subcutaneous Q4H  . insulin detemir  30 Units Subcutaneous Q2200  . lisinopril  40 mg Oral Daily  . metoprolol tartrate  25 mg Oral BID  . saccharomyces boulardii  250 mg Oral BID  . sodium chloride flush  10-40 mL Intracatheter Q12H   Continuous Infusions:  Assessment & Plan:   Principal Problem:   UTI (urinary tract infection) Active Problems:   HTN (hypertension)   Diabetes mellitus without complication (HCC)   Hyperlipidemia   Acute metabolic encephalopathy   Seizure (HCC)   Elevated troponin   AKI (acute kidney injury) (HCC)   Fall   Iron deficiency anemia   Stroke (HCC)   Acute respiratory failure with hypoxia (HCC)   Distal radial fracture_left   Altered mental status/falls.  Per husband she is at her baseline.  Patient with declining health and cognitive impairment since her prior strokes. Husband works full-time and unable to provide 24/7 assistance as patient is becoming more and more dependent for all of her ADLs. -PT rec. SNF -Due to persistent declining health palliative care was consulted as patient remains full code with very poor functional status. Palliative's input was appreciated please see note Resolved now-plan to long term care facility , insurance auth pending.     Abnormal UA.  Patient remained afebrile with no leukocytosis.  Urine culture with insignificant growth.  Provider over the weekend discussed with Dr. Algis Liming of ID and he advised blood cultures as she has a prior history of urine cultures positive for Granulicatella whichis a fastidious organism  that can sometimes rarely be associated with endocarditis. bcx negative No abx at this time  Nursing concern about aspiration.  Formal swallow evaluation was obtained, patient with oropharyngeal phase dysphagia and they were recommending dysphagia diet Reevaluated due to nursing concern with patient at risk of aspiration-reevaluated by speech on 7/9-recommend continue a modified diet of Dysphagia level 2 (MINCED foods w/ gravies) w/ Nectar consistency liquids; general aspiration and Reflux precautions. -keep HOB at 30 degrees   Left distal radial fracture.  Secondary  to mechanical fall. -Patient took off her splint.  There was significant edema of left hand and wrist.  Orthopedic was reconsulted at husband's request as patient will be going to a long-term facility. -Dr. Joice Lofts saw the patient and advised soft immobilizer due to significant edema and an outpatient follow-up in 7 to 10 days for a possible replacement of short arm cast. -avoid/minimize narcotics, use tylenol prn for pain If still here next week will have ortho f/u with pt here.  .  Essential hypertension.   Stable Continue metoprolol and lisinopril   AKI.   Resolved with IV fluids  Encourage p.o. intake  Creatinine now 0.92  Type 2 diabetes mellitus without complications. Patient was on Metformin at home.   Blood glucose levels mildly elevated- Continue RISS Plan: Increase Levemir to 30U bid   Seizure disorder.  No acute concern. Continue with Depakote  -Continue seizure precautions  History of CVA. -Continue home dose of aspirin, Plavix and Lipitor.   DVT prophylaxis: Lovenox Code Status: Full Family Communication: None at bedside Disposition Plan: SNF Status is: Inpatient  Remains inpatient appropriate because:Unsafe d/c plan   Dispo: The patient is from: home              Anticipated d/c is to: Long-term facility              Anticipated d/c date is: To be determined as authorization  pending              Patient currently is not medically stable to d/c.Needs long term facility, authorization pending.             LOS: 7 days   Time spent:63min with >50%on coc    Lynn Ito, MD [facility] Pager [phone]  If 7PM-7AM, please contact night-coverage [email or web address] Password [facility] 03/26/2020, 7:53 AMPatient ID: [name], female   DOB: 12/02/1965, 54 y.o.   MRN: [id number]

## 2020-03-26 NOTE — Plan of Care (Signed)
  Problem: Urinary Elimination: Goal: Signs and symptoms of infection will decrease Outcome: Not Progressing   

## 2020-03-26 NOTE — Plan of Care (Signed)
  Problem: Fluid Volume: Goal: Hemodynamic stability will improve Outcome: Progressing   Problem: Clinical Measurements: Goal: Diagnostic test results will improve Outcome: Progressing Goal: Signs and symptoms of infection will decrease Outcome: Progressing   Problem: Education: Goal: Knowledge of General Education information will improve Description: Including pain rating scale, medication(s)/side effects and non-pharmacologic comfort measures Outcome: Progressing   Problem: Health Behavior/Discharge Planning: Goal: Ability to manage health-related needs will improve Outcome: Progressing   Problem: Clinical Measurements: Goal: Ability to maintain clinical measurements within normal limits will improve Outcome: Progressing Goal: Will remain free from infection Outcome: Progressing Goal: Diagnostic test results will improve Outcome: Progressing Goal: Respiratory complications will improve Outcome: Progressing Goal: Cardiovascular complication will be avoided Outcome: Progressing   Problem: Activity: Goal: Risk for activity intolerance will decrease Outcome: Progressing   Problem: Nutrition: Goal: Adequate nutrition will be maintained Outcome: Progressing   Problem: Coping: Goal: Level of anxiety will decrease Outcome: Progressing   Problem: Elimination: Goal: Will not experience complications related to bowel motility Outcome: Progressing Goal: Will not experience complications related to urinary retention Outcome: Progressing   Problem: Pain Managment: Goal: General experience of comfort will improve Outcome: Progressing   Problem: Safety: Goal: Ability to remain free from injury will improve Outcome: Progressing   Problem: Skin Integrity: Goal: Risk for impaired skin integrity will decrease Outcome: Progressing   Problem: Education: Goal: Knowledge of General Education information will improve Description: Including pain rating scale,  medication(s)/side effects and non-pharmacologic comfort measures Outcome: Progressing   Problem: Health Behavior/Discharge Planning: Goal: Ability to manage health-related needs will improve Outcome: Progressing   Problem: Clinical Measurements: Goal: Ability to maintain clinical measurements within normal limits will improve Outcome: Progressing Goal: Will remain free from infection Outcome: Progressing Goal: Diagnostic test results will improve Outcome: Progressing Goal: Respiratory complications will improve Outcome: Progressing Goal: Cardiovascular complication will be avoided Outcome: Progressing   Problem: Urinary Elimination: Goal: Signs and symptoms of infection will decrease Outcome: Progressing

## 2020-03-27 LAB — GLUCOSE, CAPILLARY
Glucose-Capillary: 139 mg/dL — ABNORMAL HIGH (ref 70–99)
Glucose-Capillary: 189 mg/dL — ABNORMAL HIGH (ref 70–99)
Glucose-Capillary: 142 mg/dL — ABNORMAL HIGH (ref 70–99)
Glucose-Capillary: 229 mg/dL — ABNORMAL HIGH (ref 70–99)
Glucose-Capillary: 244 mg/dL — ABNORMAL HIGH (ref 70–99)
Glucose-Capillary: 269 mg/dL — ABNORMAL HIGH (ref 70–99)

## 2020-03-27 MED ORDER — SENNOSIDES-DOCUSATE SODIUM 8.6-50 MG PO TABS
1.0000 | ORAL_TABLET | Freq: Two times a day (BID) | ORAL | Status: DC
  Administered 2020-03-27 – 2020-03-28 (×3): 1 via ORAL
  Filled 2020-03-27 (×3): qty 1

## 2020-03-27 MED ORDER — AMLODIPINE BESYLATE 5 MG PO TABS
5.0000 mg | ORAL_TABLET | Freq: Every day | ORAL | Status: DC
  Administered 2020-03-27 – 2020-03-28 (×2): 5 mg via ORAL
  Filled 2020-03-27 (×2): qty 1

## 2020-03-27 MED ORDER — POLYETHYLENE GLYCOL 3350 17 G PO PACK
17.0000 g | PACK | Freq: Every day | ORAL | Status: DC
  Administered 2020-03-27 – 2020-03-28 (×2): 17 g via ORAL
  Filled 2020-03-27 (×2): qty 1

## 2020-03-27 NOTE — Plan of Care (Signed)
  Problem: Urinary Elimination: Goal: Signs and symptoms of infection will decrease Outcome: Progressing   

## 2020-03-27 NOTE — Progress Notes (Signed)
PROGRESS NOTE    [name]  POE:[id number] DOB: 1966-01-17 DOA: 03/19/2020 PCP: [facility]    Brief Narrative:  Nur Rabold a 54 y.o.femalewith medical history significant ofhypertension, hyperlipidemia, diabetes mellitus, stroke, [name], depression, anxiety, seizure,who presents with AMS and fall.   Per report, pt recently had fall and wasseen in ED 6/29, and found to have left distal radius fracture.Wrist splint was placed.Patientwasdischarged home with prescriptions for a low-dose oftylenol with codeine. Patient is to follow up withorthopedics.Per EDP, pt was noted to be confused and hasgeneralized weakness. Patient took offwrist splint.She felltwice due to her legs bucklingwhen her husbandwas trying to get her here to the hospital.He was able to catch her and she did not strike her head.Her husband didnot think patient accidentally took too many pain medicines. Concern of UTI, urine culture with insignificant growth.  Of note, patient has no IV access as she was noncooperative with central line which time to be placed yesterday. However as patient does not appear to have any acute infection, we have agreed to not place a PICC line unless need for IV access becomes urgent.    Consultants:   Palliative orthopedics  Procedures:   Antimicrobials:       Subjective: Lying in bed, opens eyes, told me has no complaints. Then closes her eyes.   Objective: Vitals:   03/25/20 1640 03/26/20 0019 03/26/20 0819 03/26/20 2352  BP: (!) 156/54 (!) 142/71 137/85 (!) 159/79  Pulse: 95 90 89 82  Resp: 18 16 16 20   Temp: 97.9 F (36.6 C) 98.4 F (36.9 C) 98.3 F (36.8 C) 98.2 F (36.8 C)  TempSrc: Oral Oral Oral Oral  SpO2: 100% 97% 98% 94%  Weight:      Height:        Intake/Output Summary (Last 24 hours) at 03/27/2020 0820 Last data filed at 03/27/2020 05/28/2020 Gross per 24 hour  Intake 0 ml  Output 200 ml  Net -200 ml   Filed  Weights   03/19/20 0435 03/20/20 0300  Weight: 110 kg 120.2 kg    Examination:  General exam: comfortable, calm, nad, not to interactive with physical exam Respiratory system: cta, no r/w/r cardiovascular system: Regular, S1-S2 no murmurs Gastrointestinal system: Soft nontender, nondistended positive bowel sounds Central nervous system: Unable to assess as patient not participating with full exam but he is alert and oriented Extremities: No edema Skin: Warm dry Psychiatry: Mood affect appears to be appropriate in current setting    Data Reviewed: I have personally reviewed following labs and imaging studies  CBC: Recent Labs  Lab 03/21/20 0857 03/23/20 0737  WBC 5.3 6.1  HGB 10.2* 9.0*  HCT 31.6* 27.7*  MCV 62.7* 62.0*  PLT 205 180   Basic Metabolic Panel: Recent Labs  Lab 03/21/20 0857 03/23/20 0737 03/24/20 0601 03/25/20 0840  NA 140 140 139 140  K 3.5 3.7 4.1 4.1  CL 103 104 104 104  CO2 26 26 28 27   GLUCOSE 170* 202* 209* 226*  BUN 10 24* 23* 21*  CREATININE 0.83 1.23* 1.11* 0.92  CALCIUM 9.1 8.7* 8.8* 8.9   GFR: Estimated Creatinine Clearance: 86.2 mL/min (by C-G formula based on SCr of 0.92 mg/dL). Liver Function Tests: No results for input(s): AST, ALT, ALKPHOS, BILITOT, PROT, ALBUMIN in the last 168 hours. No results for input(s): LIPASE, AMYLASE in the last 168 hours. No results for input(s): AMMONIA in the last 168 hours. Coagulation Profile: No results for input(s): INR, PROTIME in the  last 168 hours. Cardiac Enzymes: No results for input(s): CKTOTAL, CKMB, CKMBINDEX, TROPONINI in the last 168 hours. BNP (last 3 results) No results for input(s): PROBNP in the last 8760 hours. HbA1C: No results for input(s): HGBA1C in the last 72 hours. CBG: Recent Labs  Lab 03/26/20 1211 03/26/20 1650 03/26/20 2010 03/26/20 2355 03/27/20 0435  GLUCAP 181* 148* 149* 189* 139*   Lipid Profile: No results for input(s): CHOL, HDL, LDLCALC, TRIG, CHOLHDL,  LDLDIRECT in the last 72 hours. Thyroid Function Tests: No results for input(s): TSH, T4TOTAL, FREET4, T3FREE, THYROIDAB in the last 72 hours. Anemia Panel: No results for input(s): VITAMINB12, FOLATE, FERRITIN, TIBC, IRON, RETICCTPCT in the last 72 hours. Sepsis Labs: No results for input(s): PROCALCITON, LATICACIDVEN in the last 168 hours.  Recent Results (from the past 240 hour(s))  Urine culture     Status: Abnormal   Collection Time: 03/19/20  5:07 AM   Specimen: Urine, Catheterized  Result Value Ref Range Status   Specimen Description   Final    URINE, CATHETERIZED Performed at [facility], [address], [place], [place] [address]    Special Requests   Final    NONE Performed at [facility], [address] [place], [place] [address]    Culture (A)  Final    <10,000 COLONIES/mL INSIGNIFICANT GROWTH Performed at [facility], [address] [place], [place] [address]    Report Status 03/20/2020 FINAL  Final  SARS Coronavirus 2 by RT PCR (hospital order, performed in [facility] hospital lab) Nasopharyngeal Nasopharyngeal Swab     Status: None   Collection Time: 03/19/20  5:20 AM   Specimen: Nasopharyngeal Swab  Result Value Ref Range Status   SARS Coronavirus 2 NEGATIVE NEGATIVE Final    Comment: (NOTE) SARS-CoV-2 target nucleic acids are NOT DETECTED.  The SARS-CoV-2 RNA is generally detectable in upper and lower respiratory specimens during the acute phase of infection. The lowest concentration of SARS-CoV-2 viral copies this assay can detect is 250 copies / mL. A negative result does not preclude SARS-CoV-2 infection and should not be used as the sole basis for treatment or other patient management decisions.  A negative result may occur with improper specimen collection / handling, submission of specimen other than nasopharyngeal swab, presence of viral mutation(s) within the areas targeted by this assay, and inadequate number of viral  copies (<250 copies / mL). A negative result must be combined with clinical observations, patient history, and epidemiological information.  Fact Sheet for Patients:   BoilerBrush.com.cy  Fact Sheet for Healthcare Providers: https://pope.com/  This test is not yet approved or  cleared by the [place] FDA and has been authorized for detection and/or diagnosis of SARS-CoV-2 by FDA under an Emergency Use Authorization (EUA).  This EUA will remain in effect (meaning this test can be used) for the duration of the COVID-19 declaration under Section 564(b)(1) of the Act, 21 U.S.C. section 360bbb-3(b)(1), unless the authorization is terminated or revoked sooner.  Performed at [facility] [address] [place], [place] [address]   Culture, blood (Routine X 2) w Reflex to ID Panel     Status: None   Collection Time: 03/21/20  8:57 AM   Specimen: BLOOD RIGHT HAND  Result Value Ref Range Status   Specimen Description BLOOD RIGHT HAND  Final   Special Requests   Final    BOTTLES DRAWN AEROBIC AND ANAEROBIC Blood Culture adequate volume   Culture   Final  NO GROWTH 5 DAYS Performed at [facility], [address] [place], [place] [address]    Report Status 03/26/2020 FINAL  Final  Culture, blood (Routine X 2) w Reflex to ID Panel     Status: None   Collection Time: 03/21/20 10:18 AM   Specimen: BLOOD  Result Value Ref Range Status   Specimen Description BLOOD BLOOD RIGHT HAND  Final   Special Requests   Final    BOTTLES DRAWN AEROBIC ONLY Blood Culture results may not be optimal due to an inadequate volume of blood received in culture bottles   Culture   Final    NO GROWTH 5 DAYS Performed at [facility], [address] [place], [place] [address]    Report Status 03/26/2020 FINAL  Final         Radiology Studies: No results found.      Scheduled Meds: . aspirin EC  81 mg Oral Daily   . atorvastatin  40 mg Oral q1800  . Chlorhexidine Gluconate Cloth  6 each Topical Daily  . clopidogrel  75 mg Oral Daily  . darifenacin  7.5 mg Oral Daily  . divalproex  250 mg Oral Daily  . divalproex  500 mg Oral QHS  . enoxaparin (LOVENOX) injection  40 mg Subcutaneous Q12H  . insulin aspart  0-9 Units Subcutaneous Q4H  . insulin detemir  30 Units Subcutaneous Q2200  . lisinopril  40 mg Oral Daily  . metoprolol tartrate  25 mg Oral BID  . saccharomyces boulardii  250 mg Oral BID  . sodium chloride flush  10-40 mL Intracatheter Q12H   Continuous Infusions:  Assessment & Plan:   Principal Problem:   UTI (urinary tract infection) Active Problems:   HTN (hypertension)   Diabetes mellitus without complication (HCC)   Hyperlipidemia   Acute metabolic encephalopathy   Seizure (HCC)   Elevated troponin   AKI (acute kidney injury) (HCC)   Fall   Iron deficiency anemia   Stroke (HCC)   Acute respiratory failure with hypoxia (HCC)   Distal radial fracture_left   Altered mental status/falls.  Per husband she is at her baseline.  Patient with declining health and cognitive impairment since her prior strokes. Husband works full-time and unable to provide 24/7 assistance as patient is becoming more and more dependent for all of her ADLs. -PT rec. SNF -Due to persistent declining health palliative care was consulted as patient remains full code with very poor functional status. Palliative's input was appreciated please see note Her mental status change has improved Plan for long-term care facility, insurance Auth pending    Abnormal UA.  Patient remained afebrile with no leukocytosis.  Urine culture with insignificant growth.  Provider over the weekend discussed with Dr. Algis Liming of ID and he advised blood cultures as she has a prior history of urine cultures positive for Granulicatella whichis a fastidious organism that can sometimes rarely be associated with endocarditis. Blood  cultures negative No antibiotics needed at this time, afebrile  Nursing concern about aspiration.  Formal swallow evaluation was obtained, patient with oropharyngeal phase dysphagia and they were recommending dysphagia diet Reevaluated due to nursing concern with patient at risk of aspiration-reevaluated by speech on 7/9-recommend continue a modified diet of Dysphagia level 2 (MINCED foods w/ gravies) w/ Nectar consistency liquids; general aspiration and Reflux precautions. Aspiration precautions, keep head of bed at 30 degrees   Left distal radial fracture.  Secondary to mechanical fall. -Patient took off her splint.  There was significant edema of left hand and wrist.  Orthopedic was reconsulted at husband's request as patient will be going to a long-term facility. -Dr. Joice LoftsPoggi saw the patient and advised soft immobilizer due to significant edema and an outpatient follow-up in 7 to 10 days for a possible replacement of short arm cast. -Continue to avoid/minimize narcotics, use Tylenol as needed for pain avoid/minimize narcotics, use tylenol prn for pain If still here next week will have Ortho follow-up while patient is inpatient  .  Essential hypertension.   Mildly still elevated We will continue metoprolol and lisinopril We will add amlodipine 5 mg daily    AKI.   Resolved with IV fluids  Encourage p.o. intake  Creatinine now 0.92  Type 2 diabetes mellitus without complications. Patient was on Metformin at home.   Blood glucose levels mildly elevated- Continue RISS Continue Levemir 30 units  daily  Seizure disorder.  No acute concern. Continue with Depakote  -Continue seizure precautions  History of CVA. -Continue home dose of aspirin, Plavix and Lipitor.   DVT prophylaxis: Lovenox Code Status: Full Family Communication: None at bedside Disposition Plan: SNF Status is: Inpatient  Remains inpatient appropriate because:Unsafe d/c plan   Dispo: The patient is  from: home              Anticipated d/c is to: Long-term facility              Anticipated d/c date is: To be determined as authorization pending              Patient currently is not medically stable to d/c.Needs long term facility, authorization pending.             LOS: 8 days   Time spent:5245min with >50%on coc    Lynn ItoSahar Liala Codispoti, MD [facility] Pager [phone]  If 7PM-7AM, please contact night-coverage [email or web address] Password [email or web address] 03/27/2020, 8:20 AM

## 2020-03-28 LAB — GLUCOSE, CAPILLARY
Glucose-Capillary: 151 mg/dL — ABNORMAL HIGH (ref 70–99)
Glucose-Capillary: 158 mg/dL — ABNORMAL HIGH (ref 70–99)
Glucose-Capillary: 164 mg/dL — ABNORMAL HIGH (ref 70–99)
Glucose-Capillary: 220 mg/dL — ABNORMAL HIGH (ref 70–99)
Glucose-Capillary: 232 mg/dL — ABNORMAL HIGH (ref 70–99)

## 2020-03-28 LAB — SARS CORONAVIRUS 2 BY RT PCR (HOSPITAL ORDER, PERFORMED IN ~~LOC~~ HOSPITAL LAB): SARS Coronavirus 2: NEGATIVE

## 2020-03-28 MED ORDER — AMLODIPINE BESYLATE 10 MG PO TABS: 10.0000 mg | ORAL_TABLET | Freq: Every day | ORAL | Status: DC

## 2020-03-28 MED ORDER — SENNOSIDES-DOCUSATE SODIUM 8.6-50 MG PO TABS: 1.0000 | ORAL_TABLET | Freq: Two times a day (BID) | ORAL | Status: DC

## 2020-03-28 MED ORDER — DIVALPROEX SODIUM 250 MG PO DR TAB: 250.0000 mg | DELAYED_RELEASE_TABLET | Freq: Every day | ORAL | Status: DC

## 2020-03-28 MED ORDER — POLYETHYLENE GLYCOL 3350 17 G PO PACK: 17.0000 g | PACK | Freq: Every day | ORAL | 0 refills | Status: AC

## 2020-03-28 MED ORDER — ACETAMINOPHEN 325 MG PO TABS: 650.0000 mg | ORAL_TABLET | Freq: Four times a day (QID) | ORAL | Status: AC | PRN

## 2020-03-28 MED ORDER — DIVALPROEX SODIUM 500 MG PO DR TAB: 500.0000 mg | DELAYED_RELEASE_TABLET | Freq: Every day | ORAL | Status: AC

## 2020-03-28 MED ORDER — LISINOPRIL 40 MG PO TABS: 40.0000 mg | ORAL_TABLET | Freq: Every day | ORAL | Status: DC

## 2020-03-28 NOTE — Progress Notes (Signed)
Pt will DC in [facility], EMS was called. Report was called to [place], Engineer, civil (consulting).

## 2020-03-28 NOTE — TOC Progression Note (Signed)
Transition of Care [facility]) - Progression Note    Patient Details  Name: [name] MRN: [id number] Date of Birth: 08-12-1966  [facility]) CM/SW Contact  Barrie Dunker, RN Phone Number: 03/28/2020, 1:43 PM  Clinical Narrative:    Sherron Monday with Tammy at the [facility], they have received auth approval and the patient can DC to SNF, needs a covid test prior to DC, Notified the physician        Expected Discharge Plan and Services                                                 Social Determinants of Health (SDOH) Interventions    Readmission Risk Interventions Readmission Risk Prevention Plan 07/25/2019  Post Dischage Appt Complete  Medication Screening Complete  Transportation Screening Complete  Some recent data might be hidden

## 2020-03-28 NOTE — TOC Progression Note (Signed)
Transition of Care [facility]) - Progression Note    Patient Details  Name: [name] MRN: [id number] Date of Birth: 04-Mar-1966  [facility]) CM/SW Contact  Barrie Dunker, RN Phone Number: 03/28/2020, 3:57 PM  Clinical Narrative:     DC packet on the chart, bedside nurse to call report to [facility] was called by [facility] CM and they will pick up the patient at 5 PM, The bedside nurse is aware       Expected Discharge Plan and Services           Expected Discharge Date: 03/28/20                                     Social Determinants of Health (SDOH) Interventions    Readmission Risk Interventions Readmission Risk Prevention Plan 07/25/2019  Post Dischage Appt Complete  Medication Screening Complete  Transportation Screening Complete  Some recent data might be hidden

## 2020-03-28 NOTE — Discharge Summary (Signed)
[name] YPP:[id number] DOB: [birth year] DOA: 03/19/2020  PCP: [facility]  Admit date: 03/19/2020 Discharge date: 03/28/2020  Admitted From: Home Disposition: [facility]  Recommendations for Outpatient Follow-up:  1. Follow up with PCP in 1 week 2. Please obtain BMP/CBC in one week 3. Orthopedics Dr. Joice Lofts in 3 days     Discharge Condition:Stable CODE STATUS: Full Diet recommendation: Dysphagia to, nectar thick,EXTRA, extra Gravy on meats, potatoes. Yogurt OR Pudding TID meals. Applesauce TID meals.  NO STRAWS!  Assistant with feeding.  Aspiration precautions  Brief/Interim Summary: [name] a 54 y.o.femalewith medical history significant ofhypertension, hyperlipidemia, diabetes mellitus, stroke, [name], depression, anxiety, seizure,who presents with AMS and fall.   Per report, pt recently had fall and wasseen in ED 6/29, and found to have left distal radius fracture.Wrist splint was placed.Patientwasdischarged home with prescriptions for a low-dose oftylenol with codeine. Patient is to follow up withorthopedics.Per EDP, pt was noted to be confused and hasgeneralized weakness. Patient took offwrist splint.She felltwice due to her legs bucklingwhen her husbandwas trying to get her here to the hospital.He was able to catch her and she did not strike her head.Her husband didnot think patient accidentally took too many pain medicines.  pt was found to have WBC 6.0, lactic acid 2.1, troponin 28, positive UDS for opiates, pending pregnancy test, Tylenol level less than 10, salicylate level less than 7, negative COVID-19 PCR, alcohol level less than 10, urinalysis (hazy appearance, small amount of leukocyte, positive nitrite, no bacteria, WBC 21-50), AKI with creatinine 1.36, BUN 27 (creatinine 0.84 on 03/03/2020), temperature normal, blood pressure 135/80, tachycardia, tachypnea, oxygen saturation 86% on room air which improved with 99% on 2 L nasal  cannula oxygen.  CT head is negative for acute intracranial abnormalities, but showed old right MCA territory infarct.  Patient was then admitted to the hospitalist service.  Altered mental status/falls.Per husband she is at her baseline. Patient with declining health and cognitive impairment since her prior strokes. Husband works full-time and unable to provide 24/7 assistance as patient is becoming more and more dependent for all of her ADLs. -PT rec. SNF -Due to persistent declining health palliative care was consulted as patient remains full code with very poor functional status. Palliative's input was appreciated Her mental status change has improved Plan for long-term care facility.    Abnormal UA.Patient remained afebrile with no leukocytosis. Urine culture with insignificant growth. Provider over the weekend discussed with Dr. Algis Liming of ID and he advised blood cultures as she has a prior history of urine cultures positive for Granulicatella whichis a fastidious organism that can sometimes rarely be associated with endocarditis. Blood cultures negative No antibiotics needed at this time, afebrile  Nursing concern about aspiration.Formal swallow evaluation was obtained,patient with oropharyngeal phase dysphagia and they were recommending dysphagia diet Reevaluated due to nursing concern with patient at risk of aspiration-reevaluated by speech on 7/9-recommend continue a modified diet of Dysphagia level 2 (MINCED foods w/ gravies) w/ Nectar consistency liquids; general aspiration and Reflux precautions. Aspiration precautions, keep head of bed at 30 degrees   Left distal radial fracture.Secondary to mechanical fall. -Patient took off her splint. There was significant edema of left hand and wrist. Orthopedic was reconsulted at husband's request as patient will be going to a long-term facility. -Dr. Harvest Dark the patient and advised soft immobilizer due to significant  edema and an outpatient follow-up in 7 to 10 days for a possible replacement of short arm cast. -Continue to avoid/minimize narcotics,  use Tylenol as needed for pain avoid/minimize narcotics, use tylenol prn for pain Follow up with orthopedics as outpatient   .  Essential hypertension. Mildly still elevated We will continue metoprolol and lisinopril Increase amlodipine to 10mg  qdaily    AKI. Resolved with IV fluids  Encourage p.o. intake  Creatinine now 0.92  Type 2 diabetes mellitus without complications. Resume metoformin on discharge Monitor FS Continue Levemir   Seizure disorder.No acute concern. Continue with Depakote  -Continue seizure precautions  History of CVA. -Continue home dose of aspirin, Plavix and Lipitor.   Discharge Diagnoses:  Principal Problem:   UTI (urinary tract infection) Active Problems:   HTN (hypertension)   Diabetes mellitus without complication (HCC)   Hyperlipidemia   Acute metabolic encephalopathy   Seizure (HCC)   Elevated troponin   AKI (acute kidney injury) (HCC)   Fall   Iron deficiency anemia   Stroke (HCC)   Acute respiratory failure with hypoxia (HCC)   Distal radial fracture_left    Discharge Instructions  Discharge Instructions    Call MD for:  temperature >100.4   Complete by: As directed    Diet - low sodium heart healthy   Complete by: As directed    Increase activity slowly   Complete by: As directed      Allergies as of 03/28/2020   No Known Allergies     Medication List    STOP taking these medications   acetaminophen-codeine 300-30 MG tablet Commonly known as: TYLENOL #3   insulin aspart 100 UNIT/ML FlexPen Commonly known as: NovoLOG FlexPen     TAKE these medications   acetaminophen 325 MG tablet Commonly known as: TYLENOL Take 2 tablets (650 mg total) by mouth every 6 (six) hours as needed for mild pain (or Fever >/= 101).   amLODipine 10 MG tablet Commonly known as:  NORVASC Take 1 tablet (10 mg total) by mouth daily. Start taking on: March 29, 2020   aspirin 81 MG EC tablet Take 1 tablet (81 mg total) by mouth daily. Swallow whole.   atorvastatin 40 MG tablet Commonly known as: LIPITOR Take 1 tablet (40 mg total) by mouth daily at 6 PM.   clopidogrel 75 MG tablet Commonly known as: PLAVIX Take 75 mg by mouth daily.   divalproex 500 MG DR tablet Commonly known as: DEPAKOTE Take 1 tablet (500 mg total) by mouth at bedtime. What changed: You were already taking a medication with the same name, and this prescription was added. Make sure you understand how and when to take each.   divalproex 250 MG DR tablet Commonly known as: DEPAKOTE Take 1 tablet (250 mg total) by mouth daily. Start taking on: March 29, 2020 What changed:   how much to take  when to take this  additional instructions   insulin detemir 100 UNIT/ML FlexPen Commonly known as: Levemir FlexPen Inject 35 Units into the skin daily at 10 pm.   lisinopril 40 MG tablet Commonly known as: ZESTRIL Take 1 tablet (40 mg total) by mouth daily. Start taking on: March 29, 2020 What changed:   medication strength  how much to take   metFORMIN 500 MG 24 hr tablet Commonly known as: GLUCOPHAGE-XR Take 500 mg by mouth 2 (two) times daily.   metoprolol tartrate 25 MG tablet Commonly known as: LOPRESSOR Take 25 mg by mouth 2 (two) times a day.   polyethylene glycol 17 g packet Commonly known as: MIRALAX / GLYCOLAX Take 17 g by mouth daily. Start taking  on: March 29, 2020   saccharomyces boulardii 250 MG capsule Commonly known as: FLORASTOR Take 250 mg by mouth 2 (two) times daily.   senna-docusate 8.6-50 MG tablet Commonly known as: Senokot-S Take 1 tablet by mouth 2 (two) times daily.   VESIcare 10 MG tablet Generic drug: solifenacin Take 10 mg by mouth 2 (two) times daily.       Contact information for follow-up providers    Poggi, Excell Seltzer, MD Follow up in 3 day(s).    Specialty: Orthopedic Surgery Contact information: [address] [facility] [place] [address] [phone]        [facility] Follow up in 1 week(s).   Specialty: General Practice Contact information: [address] [place] [address] [phone]            Contact information for after-discharge care    Destination    [facility].   Service: Skilled Nursing Why: CALL DR. POGGIS OFFICE AND CHARLES DREW UPON FOR AN APPOINTMENT Contact information: [address] [place] [address] [phone]                 No Known Allergies  Consultations:   Orthopedics, palliative  Procedures/Studies: DG Wrist Complete Left  Result Date: 03/15/2020 CLINICAL DATA:  Fall yesterday with wrist pain, initial encounter EXAM: LEFT WRIST - COMPLETE 3+ VIEW COMPARISON:  None. FINDINGS: Comminuted distal radial fracture is noted with intra-articular extension. Mild impaction is noted. No significant angulation is seen. Generalized soft tissue swelling is noted. IMPRESSION: Comminuted distal radial fracture with intra-articular extension. No other fractures are seen. No significant angulation is noted. Electronically Signed   By: Alcide Clever M.D.   On: 03/15/2020 08:49   CT Head Wo Contrast  Result Date: 03/19/2020 CLINICAL DATA:  54 year old female with history of confusion. Fall with fracture of left arm. EXAM: CT HEAD WITHOUT CONTRAST TECHNIQUE: Contiguous axial images were obtained from the base of the skull through the vertex without intravenous contrast. COMPARISON:  Head CT 02/24/2020. FINDINGS: Brain: Areas of low attenuation throughout the right frontal, parietal and temporal regions compatible with encephalomalacia from remote right MCA territory infarction, similar to the prior examination. Well-defined focus of low attenuation in the medial aspect of the left basal ganglia,  similar to the prior study, compatible with an old lacunar infarct. Patchy and confluent areas of decreased attenuation are noted throughout the deep and periventricular white matter of the cerebral hemispheres bilaterally, compatible with chronic microvascular ischemic disease. No evidence of acute infarction, hemorrhage, hydrocephalus, extra-axial collection or mass lesion/mass effect. Vascular: No hyperdense vessel or unexpected calcification. Skull: Normal. Negative for fracture or focal lesion. Sinuses/Orbits: No acute finding. Other: None. IMPRESSION: 1. No acute intracranial abnormalities. 2. Old right MCA territory infarct, left basal ganglia lacunar infarct and chronic microvascular ischemic changes in the cerebral white matter, similar to prior studies, as above. Electronically Signed   By: Trudie Reed M.D.   On: 03/19/2020 07:13   DG Chest Port 1 View  Result Date: 03/20/2020 CLINICAL DATA:  Weakness, UTI EXAM: PORTABLE CHEST 1 VIEW COMPARISON:  03/19/2020 FINDINGS: Trachea is midline. Cardiomediastinal contours with persistent cardiac enlargement. Pulmonary vascular engorgement centrally. No consolidation.  No sign of pleural effusion. Visualized skeletal structures on limited assessment are unremarkable. IMPRESSION: Cardiomegaly with pulmonary vascular engorgement. Electronically Signed   By: Donzetta Kohut M.D.   On: 03/20/2020 12:36   DG Chest [facility] 1 View  Result  Date: 03/19/2020 CLINICAL DATA:  54 year old female with history of weakness. EXAM: PORTABLE CHEST 1 VIEW COMPARISON:  Chest x-ray 02/25/2020. FINDINGS: Lung volumes are normal. No consolidative airspace disease. No pleural effusions. No pneumothorax. No pulmonary nodule or mass noted. No evidence of pulmonary edema. Heart size is mildly enlarged. Upper mediastinal contours are within normal limits. IMPRESSION: 1. No radiographic evidence of acute cardiopulmonary disease. 2. Mild cardiomegaly. Electronically Signed   By: Trudie Reed M.D.   On: 03/19/2020 06:37   [place] EKG SITE RITE  Result Date: 03/19/2020 If Site Rite image not attached, placement could not be confirmed due to current cardiac rhythm.  [place] EKG SITE RITE  Result Date: 03/19/2020 If Site Rite image not attached, placement could not be confirmed due to current cardiac rhythm.     Subjective: No complaints this AM.  More interactive and awake this AM.  Stated she had bowel movement no other issues  Discharge Exam: Vitals:   03/28/20 0018 03/28/20 0747  BP: 139/66 136/80  Pulse: 88 88  Resp: 16 18  Temp:  98 F (36.7 C)  SpO2: 100% 99%   Vitals:   03/27/20 1000 03/27/20 1528 03/28/20 0018 03/28/20 0747  BP: (!) 145/95 (!) 144/90 139/66 136/80  Pulse:  64 88 88  Resp:  Temp:  98.5 F (36.9 C)  98 F (36.7 C)  TempSrc:  Oral  Oral  SpO2:  100% 100% 99%  Weight:      Height:        General: Pt is alert, awake, not in acute distress Cardiovascular: RRR, S1/S2 +, no rubs, no gallops Respiratory: CTA bilaterally, no wheezing, no rhonchi Abdominal: Soft, NT, ND, bowel sounds + Extremities: no edema, no cyanosis    The results of significant diagnostics from this hospitalization (including imaging, microbiology, ancillary and laboratory) are listed below for reference.     Microbiology: Recent Results (from the past 240 hour(s))  Urine culture     Status: Abnormal   Collection Time: 03/19/20  5:07 AM   Specimen: Urine, Catheterized  Result Value Ref Range Status   Specimen Description   Final    URINE, CATHETERIZED Performed at [facility], [address] [place], [place] [address]    Special Requests   Final    NONE Performed at [facility], [address], [place], [place] [address]    Culture (A)  Final    <10,000 COLONIES/mL INSIGNIFICANT GROWTH Performed at [facility], [address] [place], [place] [address]    Report Status 03/20/2020 FINAL  Final  SARS Coronavirus 2 by  RT PCR (hospital order, performed in [facility] hospital lab) Nasopharyngeal Nasopharyngeal Swab     Status: None   Collection Time: 03/19/20  5:20 AM   Specimen: Nasopharyngeal Swab  Result Value Ref Range Status   SARS Coronavirus 2 NEGATIVE NEGATIVE Final    Comment: (NOTE) SARS-CoV-2 target nucleic acids are NOT DETECTED.  The SARS-CoV-2 RNA is generally detectable in upper and lower respiratory specimens during the acute phase of infection. The lowest concentration of SARS-CoV-2 viral copies this assay can detect is 250 copies / mL. A negative result does not preclude SARS-CoV-2 infection and should not be used as the sole basis for treatment or other patient management decisions.  A negative result may occur with improper specimen collection / handling, submission of specimen other than nasopharyngeal swab, presence of viral mutation(s) within the areas targeted by this  assay, and inadequate number of viral copies (<250 copies / mL). A negative result must be combined with clinical observations, patient history, and epidemiological information.  Fact Sheet for Patients:   BoilerBrush.com.cy  Fact Sheet for Healthcare Providers: https://pope.com/  This test is not yet approved or  cleared by the [place] FDA and has been authorized for detection and/or diagnosis of SARS-CoV-2 by FDA under an Emergency Use Authorization (EUA).  This EUA will remain in effect (meaning this test can be used) for the duration of the COVID-19 declaration under Section 564(b)(1) of the Act, 21 U.S.C. section 360bbb-3(b)(1), unless the authorization is terminated or revoked sooner.  Performed at [facility], [address] [place], [place] [address]   Culture, blood (Routine X 2) w Reflex to ID Panel     Status: None   Collection Time: 03/21/20  8:57 AM   Specimen: BLOOD RIGHT HAND  Result Value Ref Range Status   Specimen  Description BLOOD RIGHT HAND  Final   Special Requests   Final    BOTTLES DRAWN AEROBIC AND ANAEROBIC Blood Culture adequate volume   Culture   Final    NO GROWTH 5 DAYS Performed at [facility], [address] [place], [place] [address]    Report Status 03/26/2020 FINAL  Final  Culture, blood (Routine X 2) w Reflex to ID Panel     Status: None   Collection Time: 03/21/20 10:18 AM   Specimen: BLOOD  Result Value Ref Range Status   Specimen Description BLOOD BLOOD RIGHT HAND  Final   Special Requests   Final    BOTTLES DRAWN AEROBIC ONLY Blood Culture results may not be optimal due to an inadequate volume of blood received in culture bottles   Culture   Final    NO GROWTH 5 DAYS Performed at [facility], [address], [place], [place] [address]    Report Status 03/26/2020 FINAL  Final     Labs: BNP (last 3 results) Recent Labs    07/23/19 1128 03/19/20 0458  BNP 34.0 14.5   Basic Metabolic Panel: Recent Labs  Lab 03/23/20 0737 03/24/20 0601 03/25/20 0840  NA 140 139 140  K 3.7 4.1 4.1  CL 104 104 104  CO2 26 28 27   GLUCOSE 202* 209* 226*  BUN 24* 23* 21*  CREATININE 1.23* 1.11* 0.92  CALCIUM 8.7* 8.8* 8.9   Liver Function Tests: No results for input(s): AST, ALT, ALKPHOS, BILITOT, PROT, ALBUMIN in the last 168 hours. No results for input(s): LIPASE, AMYLASE in the last 168 hours. No results for input(s): AMMONIA in the last 168 hours. CBC: Recent Labs  Lab 03/23/20 0737  WBC 6.1  HGB 9.0*  HCT 27.7*  MCV 62.0*  PLT 180   Cardiac Enzymes: No results for input(s): CKTOTAL, CKMB, CKMBINDEX, TROPONINI in the last 168 hours. BNP: Invalid input(s): POCBNP CBG: Recent Labs  Lab 03/27/20 2111 03/28/20 0019 03/28/20 0410 03/28/20 0749 03/28/20 1153  GLUCAP 269* 220* 158* 151* 164*   D-Dimer No results for input(s): DDIMER in the last 72 hours. Hgb A1c No results for input(s): HGBA1C in the last 72 hours. Lipid Profile No  results for input(s): CHOL, HDL, LDLCALC, TRIG, CHOLHDL, LDLDIRECT in the last 72 hours. Thyroid function studies No results for input(s): TSH, T4TOTAL, T3FREE, THYROIDAB in the last 72 hours.  Invalid input(s): FREET3 Anemia work up No results for input(s): VITAMINB12, FOLATE, FERRITIN, TIBC, IRON, RETICCTPCT in the last 72 hours. Urinalysis  Component Value Date/Time   COLORURINE YELLOW (A) 03/19/2020 0507   APPEARANCEUR HAZY (A) 03/19/2020 0507   APPEARANCEUR Clear 12/27/2014 1806   LABSPEC 1.020 03/19/2020 0507   LABSPEC 1.035 12/27/2014 1806   PHURINE 5.0 03/19/2020 0507   GLUCOSEU NEGATIVE 03/19/2020 0507   GLUCOSEU >=500 12/27/2014 1806   HGBUR NEGATIVE 03/19/2020 0507   BILIRUBINUR NEGATIVE 03/19/2020 0507   BILIRUBINUR Negative 12/27/2014 1806   KETONESUR NEGATIVE 03/19/2020 0507   PROTEINUR NEGATIVE 03/19/2020 0507   NITRITE POSITIVE (A) 03/19/2020 0507   LEUKOCYTESUR SMALL (A) 03/19/2020 0507   LEUKOCYTESUR Negative 12/27/2014 1806   Sepsis Labs Invalid input(s): PROCALCITONIN,  WBC,  LACTICIDVEN Microbiology Recent Results (from the past 240 hour(s))  Urine culture     Status: Abnormal   Collection Time: 03/19/20  5:07 AM   Specimen: Urine, Catheterized  Result Value Ref Range Status   Specimen Description   Final    URINE, CATHETERIZED Performed at [facility], [address], [place], [place] [address]    Special Requests   Final    NONE Performed at [facility], [address], [place], [place] [address]    Culture (A)  Final    <10,000 COLONIES/mL INSIGNIFICANT GROWTH Performed at [facility], [address] [place], [place] [address]    Report Status 03/20/2020 FINAL  Final  SARS Coronavirus 2 by RT PCR (hospital order, performed in [facility] hospital lab) Nasopharyngeal Nasopharyngeal Swab     Status: None   Collection Time: 03/19/20  5:20 AM   Specimen: Nasopharyngeal Swab  Result Value Ref Range Status   SARS  Coronavirus 2 NEGATIVE NEGATIVE Final    Comment: (NOTE) SARS-CoV-2 target nucleic acids are NOT DETECTED.  The SARS-CoV-2 RNA is generally detectable in upper and lower respiratory specimens during the acute phase of infection. The lowest concentration of SARS-CoV-2 viral copies this assay can detect is 250 copies / mL. A negative result does not preclude SARS-CoV-2 infection and should not be used as the sole basis for treatment or other patient management decisions.  A negative result may occur with improper specimen collection / handling, submission of specimen other than nasopharyngeal swab, presence of viral mutation(s) within the areas targeted by this assay, and inadequate number of viral copies (<250 copies / mL). A negative result must be combined with clinical observations, patient history, and epidemiological information.  Fact Sheet for Patients:   BoilerBrush.com.cy  Fact Sheet for Healthcare Providers: https://pope.com/  This test is not yet approved or  cleared by the [place] FDA and has been authorized for detection and/or diagnosis of SARS-CoV-2 by FDA under an Emergency Use Authorization (EUA).  This EUA will remain in effect (meaning this test can be used) for the duration of the COVID-19 declaration under Section 564(b)(1) of the Act, 21 U.S.C. section 360bbb-3(b)(1), unless the authorization is terminated or revoked sooner.  Performed at [facility], [address] [place], [place] [address]   Culture, blood (Routine X 2) w Reflex to ID Panel     Status: None   Collection Time: 03/21/20  8:57 AM   Specimen: BLOOD RIGHT HAND  Result Value Ref Range Status   Specimen Description BLOOD RIGHT HAND  Final   Special Requests   Final    BOTTLES DRAWN AEROBIC AND ANAEROBIC Blood Culture adequate volume   Culture   Final    NO GROWTH 5 DAYS Performed at [facility], [address]  [place],  [place] [address]    Report Status 03/26/2020 FINAL  Final  Culture, blood (Routine X 2) w Reflex to ID Panel     Status: None   Collection Time: 03/21/20 10:18 AM   Specimen: BLOOD  Result Value Ref Range Status   Specimen Description BLOOD BLOOD RIGHT HAND  Final   Special Requests   Final    BOTTLES DRAWN AEROBIC ONLY Blood Culture results may not be optimal due to an inadequate volume of blood received in culture bottles   Culture   Final    NO GROWTH 5 DAYS Performed at [facility], [address], [place], [place] [address]    Report Status 03/26/2020 FINAL  Final     Time coordinating discharge: Over 30 minutes  SIGNED:   Lynn Ito, MD  [facility] 03/28/2020, 2:46 PM Pager   If 7PM-7AM, please contact night-coverage [email or web address] Password [email or web address]

## 2020-03-28 NOTE — TOC Progression Note (Addendum)
Transition of Care [facility]) - Progression Note    Patient Details  Name: [name] MRN: [id number] Date of Birth: [birth year]  Transition of [facility] CM/SW Contact  Barrie Dunker, RN Phone Number: 03/28/2020, 2:56 PM  Clinical Narrative:     Patient will be discharging to [facility] once Covid test comes back negative, The DC packet is on the chart, The TOC CM will call [facility] once the covid test is back, Called [name] the patient's husband and notified him, he asked to be called with a time that EMS will pick up       Expected Discharge Plan and Services           Expected Discharge Date: 03/28/20                                     Social Determinants of Health (SDOH) Interventions    Readmission Risk Interventions Readmission Risk Prevention Plan 07/25/2019  Post Dischage Appt Complete  Medication Screening Complete  Transportation Screening Complete  Some recent data might be hidden

## 2020-03-28 NOTE — Progress Notes (Signed)
Physical Therapy Treatment Patient Details Name: [name] MRN: [id number] DOB: 1966-06-25 Today's Date: 03/28/2020    History of Present Illness Pt. is a 54 y.o. female who was admitted to [facility] UTI, AMS, recent recurrent falls, recent Left Distal radius fracture.  Seen for cognitive evaluation this date 2/2 treatment team's concerns with judgment and safety awareness.    PT Comments    Pt agrees to session.  Continues with L wrist pain but does not limit session.  To EOB with rail and min a x 1 to complete transition.  Generally steady in sitting but requires cues to keep eyes open and engaged.  Stood x 1 with HHA x 2 but she initiates sitting quickly.  She does report dizziness.  Requested to use bathroom for BM. Assisted back to supine with mod a x 1 mostly for LE management.  Bedpan given and RN/tech aware. May consider orthostatic vitals next session.   Follow Up Recommendations  SNF     Equipment Recommendations  None recommended by PT    Recommendations for Other Services       Precautions / Restrictions Precautions Precautions: Fall Restrictions Weight Bearing Restrictions: Yes LUE Weight Bearing: Non weight bearing    Mobility  Bed Mobility Overal bed mobility: Needs Assistance Bed Mobility: Supine to Sit;Sit to Supine     Supine to sit: Min assist Sit to supine: Mod assist;+2 for physical assistance   General bed mobility comments: improved ablility to transition to sit today, still +2 to supine as she has difficulty lifting LE's  Transfers Overall transfer level: Needs assistance Equipment used: 2 person hand held assist Transfers: Sit to/from Stand Sit to Stand: Min assist;+2 safety/equipment         General transfer comment: stood x 1 but declined further due to reports of dizziness.  Ambulation/Gait                 Stairs             Wheelchair Mobility    Modified Rankin (Stroke Patients Only)       Balance Overall  balance assessment: Needs assistance Sitting-balance support: No upper extremity supported;Feet supported Sitting balance-Leahy Scale: Fair     Standing balance support: Bilateral upper extremity supported;During functional activity Standing balance-Leahy Scale: Poor                              Cognition Arousal/Alertness: Lethargic;Awake/alert Behavior During Therapy: Flat affect Overall Cognitive Status: History of cognitive impairments - at baseline                                        Exercises Other Exercises Other Exercises: seated LAQ and marches x 10 with cues to continue    General Comments        Pertinent Vitals/Pain Pain Assessment: Faces Faces Pain Scale: Hurts a little bit Pain Location: L wrist Pain Descriptors / Indicators: Grimacing Pain Intervention(s): Limited activity within patient's tolerance;Monitored during session    Home Living                      Prior Function            PT Goals (current goals can now be found in the care plan section) Progress towards PT goals: Progressing toward goals    Frequency  Min 2X/week      PT Plan Current plan remains appropriate    Co-evaluation              AM-PAC PT "6 Clicks" Mobility   Outcome Measure  Help needed turning from your back to your side while in a flat bed without using bedrails?: A Little Help needed moving from lying on your back to sitting on the side of a flat bed without using bedrails?: A Little Help needed moving to and from a bed to a chair (including a wheelchair)?: A Lot Help needed standing up from a chair using your arms (e.g., wheelchair or bedside chair)?: A Lot Help needed to walk in hospital room?: A Lot Help needed climbing 3-5 steps with a railing? : Total 6 Click Score: 13    End of Session Equipment Utilized During Treatment: Gait belt Activity Tolerance: Patient tolerated treatment well Patient left: in  bed;with call bell/phone within reach;with bed alarm set Nurse Communication: Mobility status;Other (comment) (on bed pan per her request) Hemiplegia - Right/Left: Left Hemiplegia - caused by: Cerebral infarction     Time: 4259-5638 PT Time Calculation (min) (ACUTE ONLY): 14 min  Charges:  $Therapeutic Activity: 8-22 mins                    Danielle Dess, PTA 03/28/20, 12:56 PM

## 2023-08-16 ENCOUNTER — Inpatient Hospital Stay (HOSPITAL_COMMUNITY): Payer: BC Managed Care – PPO

## 2023-08-16 ENCOUNTER — Encounter (HOSPITAL_COMMUNITY): Payer: Self-pay | Admitting: Emergency Medicine

## 2023-08-16 ENCOUNTER — Emergency Department (HOSPITAL_COMMUNITY): Payer: BC Managed Care – PPO

## 2023-08-16 ENCOUNTER — Inpatient Hospital Stay (HOSPITAL_COMMUNITY)
Admission: EM | Admit: 2023-08-16 | Discharge: 2023-08-26 | DRG: 871 | Disposition: A | Discharge status: Skilled Nursing Facility | Payer: BC Managed Care – PPO | Source: Skilled Nursing Facility | Attending: Internal Medicine | Admitting: Internal Medicine

## 2023-08-16 ENCOUNTER — Other Ambulatory Visit: Payer: Self-pay

## 2023-08-16 DIAGNOSIS — D72829 Elevated white blood cell count, unspecified: Secondary | ICD-10-CM | POA: Diagnosis present

## 2023-08-16 DIAGNOSIS — G9341 Metabolic encephalopathy: Secondary | ICD-10-CM | POA: Diagnosis present

## 2023-08-16 DIAGNOSIS — R531 Weakness: Secondary | ICD-10-CM | POA: Diagnosis not present

## 2023-08-16 DIAGNOSIS — D649 Anemia, unspecified: Secondary | ICD-10-CM

## 2023-08-16 DIAGNOSIS — J15211 Pneumonia due to Methicillin susceptible Staphylococcus aureus: Secondary | ICD-10-CM | POA: Diagnosis present

## 2023-08-16 DIAGNOSIS — R6521 Severe sepsis with septic shock: Secondary | ICD-10-CM | POA: Diagnosis present

## 2023-08-16 DIAGNOSIS — Z7189 Other specified counseling: Secondary | ICD-10-CM | POA: Diagnosis not present

## 2023-08-16 DIAGNOSIS — E663 Overweight: Secondary | ICD-10-CM | POA: Diagnosis present

## 2023-08-16 DIAGNOSIS — E785 Hyperlipidemia, unspecified: Secondary | ICD-10-CM | POA: Diagnosis present

## 2023-08-16 DIAGNOSIS — Z515 Encounter for palliative care: Secondary | ICD-10-CM

## 2023-08-16 DIAGNOSIS — E869 Volume depletion, unspecified: Secondary | ICD-10-CM | POA: Diagnosis present

## 2023-08-16 DIAGNOSIS — N179 Acute kidney failure, unspecified: Secondary | ICD-10-CM | POA: Diagnosis present

## 2023-08-16 DIAGNOSIS — L89323 Pressure ulcer of left buttock, stage 3: Secondary | ICD-10-CM | POA: Diagnosis present

## 2023-08-16 DIAGNOSIS — Z833 Family history of diabetes mellitus: Secondary | ICD-10-CM

## 2023-08-16 DIAGNOSIS — R011 Cardiac murmur, unspecified: Secondary | ICD-10-CM | POA: Diagnosis not present

## 2023-08-16 DIAGNOSIS — Z8249 Family history of ischemic heart disease and other diseases of the circulatory system: Secondary | ICD-10-CM

## 2023-08-16 DIAGNOSIS — F32A Depression, unspecified: Secondary | ICD-10-CM | POA: Diagnosis present

## 2023-08-16 DIAGNOSIS — Z1152 Encounter for screening for COVID-19: Secondary | ICD-10-CM

## 2023-08-16 DIAGNOSIS — N390 Urinary tract infection, site not specified: Secondary | ICD-10-CM | POA: Diagnosis present

## 2023-08-16 DIAGNOSIS — Z66 Do not resuscitate: Secondary | ICD-10-CM | POA: Diagnosis present

## 2023-08-16 DIAGNOSIS — E871 Hypo-osmolality and hyponatremia: Secondary | ICD-10-CM | POA: Diagnosis present

## 2023-08-16 DIAGNOSIS — Z7902 Long term (current) use of antithrombotics/antiplatelets: Secondary | ICD-10-CM

## 2023-08-16 DIAGNOSIS — E870 Hyperosmolality and hypernatremia: Secondary | ICD-10-CM | POA: Diagnosis present

## 2023-08-16 DIAGNOSIS — Z79899 Other long term (current) drug therapy: Secondary | ICD-10-CM

## 2023-08-16 DIAGNOSIS — Z800 Family history of malignant neoplasm of digestive organs: Secondary | ICD-10-CM

## 2023-08-16 DIAGNOSIS — R627 Adult failure to thrive: Secondary | ICD-10-CM | POA: Diagnosis present

## 2023-08-16 DIAGNOSIS — G40909 Epilepsy, unspecified, not intractable, without status epilepticus: Secondary | ICD-10-CM | POA: Diagnosis present

## 2023-08-16 DIAGNOSIS — E1165 Type 2 diabetes mellitus with hyperglycemia: Secondary | ICD-10-CM | POA: Diagnosis present

## 2023-08-16 DIAGNOSIS — D638 Anemia in other chronic diseases classified elsewhere: Secondary | ICD-10-CM | POA: Diagnosis present

## 2023-08-16 DIAGNOSIS — E440 Moderate protein-calorie malnutrition: Secondary | ICD-10-CM | POA: Diagnosis present

## 2023-08-16 DIAGNOSIS — J9601 Acute respiratory failure with hypoxia: Secondary | ICD-10-CM | POA: Diagnosis present

## 2023-08-16 DIAGNOSIS — Z7982 Long term (current) use of aspirin: Secondary | ICD-10-CM

## 2023-08-16 DIAGNOSIS — A419 Sepsis, unspecified organism: Secondary | ICD-10-CM | POA: Diagnosis present

## 2023-08-16 DIAGNOSIS — R4182 Altered mental status, unspecified: Principal | ICD-10-CM

## 2023-08-16 DIAGNOSIS — L89326 Pressure-induced deep tissue damage of left buttock: Secondary | ICD-10-CM | POA: Diagnosis present

## 2023-08-16 DIAGNOSIS — R1310 Dysphagia, unspecified: Secondary | ICD-10-CM | POA: Diagnosis present

## 2023-08-16 DIAGNOSIS — E872 Acidosis, unspecified: Secondary | ICD-10-CM | POA: Diagnosis present

## 2023-08-16 DIAGNOSIS — I10 Essential (primary) hypertension: Secondary | ICD-10-CM | POA: Diagnosis present

## 2023-08-16 DIAGNOSIS — L8990 Pressure ulcer of unspecified site, unspecified stage: Secondary | ICD-10-CM

## 2023-08-16 DIAGNOSIS — Z7984 Long term (current) use of oral hypoglycemic drugs: Secondary | ICD-10-CM

## 2023-08-16 DIAGNOSIS — Z6831 Body mass index (BMI) 31.0-31.9, adult: Secondary | ICD-10-CM | POA: Diagnosis not present

## 2023-08-16 DIAGNOSIS — D509 Iron deficiency anemia, unspecified: Secondary | ICD-10-CM | POA: Diagnosis present

## 2023-08-16 DIAGNOSIS — E861 Hypovolemia: Secondary | ICD-10-CM | POA: Diagnosis present

## 2023-08-16 DIAGNOSIS — R569 Unspecified convulsions: Secondary | ICD-10-CM | POA: Diagnosis not present

## 2023-08-16 DIAGNOSIS — L89313 Pressure ulcer of right buttock, stage 3: Secondary | ICD-10-CM | POA: Diagnosis present

## 2023-08-16 DIAGNOSIS — K7200 Acute and subacute hepatic failure without coma: Secondary | ICD-10-CM | POA: Diagnosis present

## 2023-08-16 DIAGNOSIS — R579 Shock, unspecified: Secondary | ICD-10-CM | POA: Diagnosis not present

## 2023-08-16 DIAGNOSIS — I69391 Dysphagia following cerebral infarction: Secondary | ICD-10-CM | POA: Diagnosis not present

## 2023-08-16 DIAGNOSIS — R7989 Other specified abnormal findings of blood chemistry: Secondary | ICD-10-CM | POA: Diagnosis present

## 2023-08-16 DIAGNOSIS — R7401 Elevation of levels of liver transaminase levels: Secondary | ICD-10-CM | POA: Diagnosis present

## 2023-08-16 DIAGNOSIS — G9389 Other specified disorders of brain: Secondary | ICD-10-CM | POA: Diagnosis present

## 2023-08-16 DIAGNOSIS — I712 Thoracic aortic aneurysm, without rupture, unspecified: Secondary | ICD-10-CM | POA: Diagnosis present

## 2023-08-16 DIAGNOSIS — E876 Hypokalemia: Secondary | ICD-10-CM | POA: Diagnosis present

## 2023-08-16 DIAGNOSIS — Z8673 Personal history of transient ischemic attack (TIA), and cerebral infarction without residual deficits: Secondary | ICD-10-CM

## 2023-08-16 DIAGNOSIS — L89316 Pressure-induced deep tissue damage of right buttock: Secondary | ICD-10-CM | POA: Diagnosis present

## 2023-08-16 DIAGNOSIS — Z978 Presence of other specified devices: Secondary | ICD-10-CM | POA: Diagnosis not present

## 2023-08-16 DIAGNOSIS — E878 Other disorders of electrolyte and fluid balance, not elsewhere classified: Secondary | ICD-10-CM | POA: Diagnosis not present

## 2023-08-16 LAB — BLOOD CULTURE ID PANEL (REFLEXED) - BCID2

## 2023-08-16 LAB — URINALYSIS, W/ REFLEX TO CULTURE (INFECTION SUSPECTED)
Bilirubin Urine: NEGATIVE
Glucose, UA: NEGATIVE mg/dL
Hgb urine dipstick: NEGATIVE
Ketones, ur: NEGATIVE mg/dL
Nitrite: POSITIVE — AB
Protein, ur: 30 mg/dL — AB
Specific Gravity, Urine: 1.02 (ref 1.005–1.030)
pH: 6 (ref 5.0–8.0)

## 2023-08-16 LAB — CBG MONITORING, ED
Glucose-Capillary: 202 mg/dL — ABNORMAL HIGH (ref 70–99)
Glucose-Capillary: 202 mg/dL — ABNORMAL HIGH (ref 70–99)

## 2023-08-16 LAB — COMPREHENSIVE METABOLIC PANEL
ALT: 74 U/L — ABNORMAL HIGH (ref 0–44)
ALT: 60 U/L — ABNORMAL HIGH (ref 0–44)
AST: 78 U/L — ABNORMAL HIGH (ref 15–41)
AST: 57 U/L — ABNORMAL HIGH (ref 15–41)
Albumin: 2.9 g/dL — ABNORMAL LOW (ref 3.5–5.0)
Albumin: 2.5 g/dL — ABNORMAL LOW (ref 3.5–5.0)
Alkaline Phosphatase: 40 U/L (ref 38–126)
Alkaline Phosphatase: 40 U/L (ref 38–126)
Anion gap: 11 (ref 5–15)
Anion gap: 14 (ref 5–15)
BUN: 45 mg/dL — ABNORMAL HIGH (ref 6–20)
BUN: 40 mg/dL — ABNORMAL HIGH (ref 6–20)
CO2: 27 mmol/L (ref 22–32)
CO2: 25 mmol/L (ref 22–32)
Calcium: 9.4 mg/dL (ref 8.9–10.3)
Calcium: 8.9 mg/dL (ref 8.9–10.3)
Chloride: 118 mmol/L — ABNORMAL HIGH (ref 98–111)
Chloride: 117 mmol/L — ABNORMAL HIGH (ref 98–111)
Creatinine, Ser: 0.99 mg/dL (ref 0.44–1.00)
Creatinine, Ser: 0.87 mg/dL (ref 0.44–1.00)
GFR, Estimated: >60 mL/min (ref >60)
GFR, Estimated: >60 mL/min (ref >60)
Glucose, Bld: 222 mg/dL — ABNORMAL HIGH (ref 70–99)
Glucose, Bld: 205 mg/dL — ABNORMAL HIGH (ref 70–99)
Potassium: 3.9 mmol/L (ref 3.5–5.1)
Potassium: 3.9 mmol/L (ref 3.5–5.1)
Sodium: 156 mmol/L — ABNORMAL HIGH (ref 135–145)
Sodium: 156 mmol/L — ABNORMAL HIGH (ref 135–145)
Total Bilirubin: 0.8 mg/dL (ref <1.2)
Total Bilirubin: 0.6 mg/dL (ref <1.2)
Total Protein: 6.6 g/dL (ref 6.5–8.1)
Total Protein: 5.7 g/dL — ABNORMAL LOW (ref 6.5–8.1)

## 2023-08-16 LAB — IRON AND TIBC
Iron: 20 ug/dL — ABNORMAL LOW (ref 28–170)
Saturation Ratios: 10 % — ABNORMAL LOW (ref 10.4–31.8)
TIBC: 204 ug/dL — ABNORMAL LOW (ref 250–450)
UIBC: 184 ug/dL

## 2023-08-16 LAB — GLUCOSE, CAPILLARY
Glucose-Capillary: 196 mg/dL — ABNORMAL HIGH (ref 70–99)
Glucose-Capillary: 202 mg/dL — ABNORMAL HIGH (ref 70–99)
Glucose-Capillary: 158 mg/dL — ABNORMAL HIGH (ref 70–99)
Glucose-Capillary: 180 mg/dL — ABNORMAL HIGH (ref 70–99)
Glucose-Capillary: 200 mg/dL — ABNORMAL HIGH (ref 70–99)
Glucose-Capillary: 171 mg/dL — ABNORMAL HIGH (ref 70–99)

## 2023-08-16 LAB — PROTIME-INR
INR: 1.1 (ref 0.8–1.2)
Prothrombin Time: 14.5 s (ref 11.4–15.2)

## 2023-08-16 LAB — CBC WITH DIFFERENTIAL/PLATELET
Abs Immature Granulocytes: 0.08 10*3/uL — ABNORMAL HIGH (ref 0.00–0.07)
Basophils Absolute: 0.1 10*3/uL (ref 0.0–0.1)
Basophils Relative: 0 %
Eosinophils Absolute: 0 10*3/uL (ref 0.0–0.5)
Eosinophils Relative: 0 %
HCT: 31.1 % — ABNORMAL LOW (ref 36.0–46.0)
Hemoglobin: 9.8 g/dL — ABNORMAL LOW (ref 12.0–15.0)
Immature Granulocytes: 1 %
Lymphocytes Relative: 19 %
Lymphs Abs: 2.5 10*3/uL (ref 0.7–4.0)
MCH: 23 pg — ABNORMAL LOW (ref 26.0–34.0)
MCHC: 31.5 g/dL (ref 30.0–36.0)
MCV: 73 fL — ABNORMAL LOW (ref 80.0–100.0)
Monocytes Absolute: 1.9 10*3/uL — ABNORMAL HIGH (ref 0.1–1.0)
Monocytes Relative: 15 %
Neutro Abs: 8.8 10*3/uL — ABNORMAL HIGH (ref 1.7–7.7)
Neutrophils Relative %: 65 %
Platelets: 286 10*3/uL (ref 150–400)
RBC: 4.26 MIL/uL (ref 3.87–5.11)
RDW: 19.9 % — ABNORMAL HIGH (ref 11.5–15.5)
WBC: 13.4 10*3/uL — ABNORMAL HIGH (ref 4.0–10.5)
nRBC: 0.1 % (ref 0.0–0.2)

## 2023-08-16 LAB — LACTIC ACID, PLASMA
Lactic Acid, Venous: 1.5 mmol/L (ref 0.5–1.9)
Lactic Acid, Venous: 4 mmol/L (ref 0.5–1.9)
Lactic Acid, Venous: 5.3 mmol/L (ref 0.5–1.9)
Lactic Acid, Venous: 5 mmol/L (ref 0.5–1.9)

## 2023-08-16 LAB — MRSA NEXT GEN BY PCR, NASAL: MRSA by PCR Next Gen: NOT DETECTED

## 2023-08-16 LAB — HEMOGLOBIN A1C
Hgb A1c MFr Bld: 5 % (ref 4.8–5.6)
Mean Plasma Glucose: 96.8 mg/dL

## 2023-08-16 LAB — PROCALCITONIN: Procalcitonin: 0.17 ng/mL

## 2023-08-16 LAB — ECHOCARDIOGRAM COMPLETE
AR max vel: 2.47 cm2
AV Area VTI: 2.57 cm2
AV Area mean vel: 2.59 cm2
AV Mean grad: 4 mm[Hg]
AV Peak grad: 7.3 mm[Hg]
Ao pk vel: 1.35 m/s
Area-P 1/2: 3.39 cm2
Height: 62 in
S' Lateral: 2 cm
Weight: 2400 [oz_av]

## 2023-08-16 LAB — FOLATE: Folate: 7.1 ng/mL (ref >5.9)

## 2023-08-16 LAB — MAGNESIUM
Magnesium: 1.8 mg/dL (ref 1.7–2.4)
Magnesium: 1.6 mg/dL — ABNORMAL LOW (ref 1.7–2.4)

## 2023-08-16 LAB — BLOOD GAS, ARTERIAL
Acid-Base Excess: 4.7 mmol/L — ABNORMAL HIGH (ref 0.0–2.0)
Bicarbonate: 29.2 mmol/L — ABNORMAL HIGH (ref 20.0–28.0)
Drawn by: 38235
O2 Saturation: 98.5 %
Patient temperature: 39.1
pCO2 arterial: 46 mm[Hg] (ref 32–48)
pH, Arterial: 7.42 (ref 7.35–7.45)
pO2, Arterial: 100 mm[Hg] (ref 83–108)

## 2023-08-16 LAB — APTT: aPTT: 29 s (ref 24–36)

## 2023-08-16 LAB — PHOSPHORUS
Phosphorus: 3.2 mg/dL (ref 2.5–4.6)
Phosphorus: 2.4 mg/dL — ABNORMAL LOW (ref 2.5–4.6)

## 2023-08-16 LAB — RESP PANEL BY RT-PCR (RSV, FLU A&B, COVID)  RVPGX2
Influenza A by PCR: NEGATIVE
Influenza B by PCR: NEGATIVE
Resp Syncytial Virus by PCR: NEGATIVE
SARS Coronavirus 2 by RT PCR: NEGATIVE

## 2023-08-16 LAB — VITAMIN B12: Vitamin B-12: 269 pg/mL (ref 180–914)

## 2023-08-16 LAB — FERRITIN: Ferritin: 388 ng/mL — ABNORMAL HIGH (ref 11–307)

## 2023-08-16 LAB — STREP PNEUMONIAE URINARY ANTIGEN: Strep Pneumo Urinary Antigen: NEGATIVE

## 2023-08-16 LAB — PREGNANCY, URINE: Preg Test, Ur: NEGATIVE

## 2023-08-16 MED ORDER — DOCUSATE SODIUM 50 MG/5ML PO LIQD
100.0000 mg | Freq: Two times a day (BID) | ORAL | Status: DC
  Administered 2023-08-16 – 2023-08-18 (×4): 100 mg
  Filled 2023-08-16 (×5): qty 10

## 2023-08-16 MED ORDER — LACTATED RINGERS IV SOLN: INTRAVENOUS | Status: AC

## 2023-08-16 MED ORDER — VANCOMYCIN HCL 750 MG/150ML IV SOLN: 750.0000 mg | Freq: Two times a day (BID) | INTRAVENOUS | Status: DC

## 2023-08-16 MED ORDER — POLYETHYLENE GLYCOL 3350 17 G PO PACK: 17.0000 g | PACK | Freq: Every day | ORAL | Status: DC | PRN

## 2023-08-16 MED ORDER — SODIUM CHLORIDE 0.9 % IV SOLN
2.0000 g | Freq: Once | INTRAVENOUS | Status: AC
  Administered 2023-08-16: 2 g via INTRAVENOUS
  Filled 2023-08-16: qty 20

## 2023-08-16 MED ORDER — PROSOURCE TF20 ENFIT COMPATIBL EN LIQD: 60.0000 mL | Freq: Every day | ENTERAL | Status: DC

## 2023-08-16 MED ORDER — SODIUM CHLORIDE 0.9 % IV SOLN: 500.0000 mg | INTRAVENOUS | Status: DC

## 2023-08-16 MED ORDER — INSULIN ASPART 100 UNIT/ML IJ SOLN
0.0000 [IU] | INTRAMUSCULAR | Status: DC
  Administered 2023-08-16: 3 [IU] via SUBCUTANEOUS
  Administered 2023-08-16: 2 [IU] via SUBCUTANEOUS

## 2023-08-16 MED ORDER — FENTANYL CITRATE PF 50 MCG/ML IJ SOSY
50.0000 ug | PREFILLED_SYRINGE | Freq: Once | INTRAMUSCULAR | Status: AC
  Administered 2023-08-16: 50 ug via INTRAVENOUS
  Filled 2023-08-16: qty 1

## 2023-08-16 MED ORDER — IPRATROPIUM-ALBUTEROL 0.5-2.5 (3) MG/3ML IN SOLN
3.0000 mL | Freq: Four times a day (QID) | RESPIRATORY_TRACT | Status: DC
  Administered 2023-08-16 – 2023-08-19 (×16): 3 mL via RESPIRATORY_TRACT
  Filled 2023-08-16 (×16): qty 3

## 2023-08-16 MED ORDER — LACTATED RINGERS IV BOLUS
3600.0000 mL | Freq: Once | INTRAVENOUS | Status: AC
  Administered 2023-08-16: 3600 mL via INTRAVENOUS

## 2023-08-16 MED ORDER — ACETYLCYSTEINE 20 % IN SOLN
2.0000 mL | Freq: Four times a day (QID) | RESPIRATORY_TRACT | Status: DC
  Administered 2023-08-16 – 2023-08-17 (×4): 2 mL via RESPIRATORY_TRACT
  Filled 2023-08-16 (×6): qty 4

## 2023-08-16 MED ORDER — LACTATED RINGERS IV BOLUS
1000.0000 mL | Freq: Once | INTRAVENOUS | Status: AC
  Administered 2023-08-16: 1000 mL via INTRAVENOUS

## 2023-08-16 MED ORDER — FENTANYL BOLUS VIA INFUSION
50.0000 ug | INTRAVENOUS | Status: DC | PRN
  Administered 2023-08-16: 50 ug via INTRAVENOUS

## 2023-08-16 MED ORDER — ACETAMINOPHEN 325 MG PO TABS: 650.0000 mg | ORAL_TABLET | Freq: Four times a day (QID) | ORAL | Status: DC | PRN

## 2023-08-16 MED ORDER — DOCUSATE SODIUM 50 MG/5ML PO LIQD: 100.0000 mg | Freq: Two times a day (BID) | ORAL | Status: DC

## 2023-08-16 MED ORDER — PANTOPRAZOLE SODIUM 40 MG IV SOLR
40.0000 mg | Freq: Every day | INTRAVENOUS | Status: DC
  Administered 2023-08-16 – 2023-08-22 (×7): 40 mg via INTRAVENOUS
  Filled 2023-08-16 (×7): qty 10

## 2023-08-16 MED ORDER — IOHEXOL 350 MG/ML SOLN
75.0000 mL | Freq: Once | INTRAVENOUS | Status: AC | PRN
  Administered 2023-08-16: 75 mL via INTRAVENOUS

## 2023-08-16 MED ORDER — ROCURONIUM BROMIDE 10 MG/ML (PF) SYRINGE
PREFILLED_SYRINGE | INTRAVENOUS | Status: AC
  Filled 2023-08-16: qty 10

## 2023-08-16 MED ORDER — INSULIN ASPART 100 UNIT/ML IJ SOLN
0.0000 [IU] | INTRAMUSCULAR | Status: DC
  Administered 2023-08-16 – 2023-08-17 (×6): 3 [IU] via SUBCUTANEOUS

## 2023-08-16 MED ORDER — SODIUM CHLORIDE 0.9 % IV SOLN
250.0000 mL | INTRAVENOUS | Status: AC
  Administered 2023-08-16: 250 mL via INTRAVENOUS

## 2023-08-16 MED ORDER — HEPARIN SODIUM (PORCINE) 5000 UNIT/ML IJ SOLN
5000.0000 [IU] | Freq: Three times a day (TID) | INTRAMUSCULAR | Status: DC
  Administered 2023-08-16 – 2023-08-26 (×31): 5000 [IU] via SUBCUTANEOUS
  Filled 2023-08-16 (×31): qty 1

## 2023-08-16 MED ORDER — FENTANYL CITRATE PF 50 MCG/ML IJ SOSY: 50.0000 ug | PREFILLED_SYRINGE | INTRAMUSCULAR | Status: DC | PRN

## 2023-08-16 MED ORDER — FENTANYL CITRATE PF 50 MCG/ML IJ SOSY
50.0000 ug | PREFILLED_SYRINGE | INTRAMUSCULAR | Status: DC | PRN
  Administered 2023-08-16: 100 ug via INTRAVENOUS
  Filled 2023-08-16: qty 2

## 2023-08-16 MED ORDER — PROPOFOL 1000 MG/100ML IV EMUL
0.0000 ug/kg/min | INTRAVENOUS | Status: DC
  Administered 2023-08-16 (×2): 10 ug/kg/min via INTRAVENOUS
  Filled 2023-08-16: qty 100

## 2023-08-16 MED ORDER — NOREPINEPHRINE 4 MG/250ML-% IV SOLN
INTRAVENOUS | Status: AC
  Administered 2023-08-16: 2 ug/min via INTRAVENOUS
  Filled 2023-08-16: qty 250

## 2023-08-16 MED ORDER — PROPOFOL 1000 MG/100ML IV EMUL
INTRAVENOUS | Status: AC
  Filled 2023-08-16: qty 100

## 2023-08-16 MED ORDER — THIAMINE MONONITRATE 100 MG PO TABS
100.0000 mg | ORAL_TABLET | Freq: Every day | ORAL | Status: AC
  Administered 2023-08-16 – 2023-08-22 (×7): 100 mg
  Filled 2023-08-16 (×7): qty 1

## 2023-08-16 MED ORDER — NOREPINEPHRINE 4 MG/250ML-% IV SOLN: 0.0000 ug/min | INTRAVENOUS | Status: DC

## 2023-08-16 MED ORDER — DOCUSATE SODIUM 100 MG PO CAPS: 100.0000 mg | ORAL_CAPSULE | Freq: Two times a day (BID) | ORAL | Status: DC | PRN

## 2023-08-16 MED ORDER — ETOMIDATE 2 MG/ML IV SOLN
INTRAVENOUS | Status: AC
  Filled 2023-08-16: qty 10

## 2023-08-16 MED ORDER — POLYETHYLENE GLYCOL 3350 17 G PO PACK
17.0000 g | PACK | Freq: Every day | ORAL | Status: DC
  Administered 2023-08-16 – 2023-08-18 (×2): 17 g
  Filled 2023-08-16 (×3): qty 1

## 2023-08-16 MED ORDER — VITAMIN B-12 1000 MCG PO TABS
1000.0000 ug | ORAL_TABLET | Freq: Every day | ORAL | Status: DC
  Administered 2023-08-16 – 2023-08-23 (×7): 1000 ug
  Filled 2023-08-16 (×10): qty 1

## 2023-08-16 MED ORDER — VANCOMYCIN HCL 1250 MG/250ML IV SOLN
1250.0000 mg | INTRAVENOUS | Status: DC
  Administered 2023-08-17 – 2023-08-19 (×3): 1250 mg via INTRAVENOUS
  Filled 2023-08-16 (×3): qty 250

## 2023-08-16 MED ORDER — VALPROATE SODIUM 100 MG/ML IV SOLN
250.0000 mg | Freq: Every day | INTRAVENOUS | Status: DC
  Administered 2023-08-16 – 2023-08-19 (×4): 250 mg via INTRAVENOUS
  Filled 2023-08-16 (×5): qty 2.5

## 2023-08-16 MED ORDER — CHLORHEXIDINE GLUCONATE CLOTH 2 % EX PADS
6.0000 | MEDICATED_PAD | Freq: Every day | CUTANEOUS | Status: DC
  Administered 2023-08-16 – 2023-08-23 (×8): 6 via TOPICAL

## 2023-08-16 MED ORDER — VITAL AF 1.2 CAL PO LIQD
1000.0000 mL | ORAL | Status: DC
  Administered 2023-08-16 – 2023-08-18 (×2): 1000 mL

## 2023-08-16 MED ORDER — ORAL CARE MOUTH RINSE
15.0000 mL | OROMUCOSAL | Status: DC
  Administered 2023-08-16 – 2023-08-18 (×31): 15 mL via OROMUCOSAL

## 2023-08-16 MED ORDER — SODIUM CHLORIDE 0.9 % IV SOLN
500.0000 mg | Freq: Every day | INTRAVENOUS | Status: AC
  Administered 2023-08-16 – 2023-08-18 (×3): 500 mg via INTRAVENOUS
  Filled 2023-08-16 (×3): qty 5

## 2023-08-16 MED ORDER — SODIUM CHLORIDE 0.9 % IV SOLN
2.0000 g | Freq: Every day | INTRAVENOUS | Status: DC
  Administered 2023-08-16 – 2023-08-18 (×3): 2 g via INTRAVENOUS
  Filled 2023-08-16 (×3): qty 20

## 2023-08-16 MED ORDER — ORAL CARE MOUTH RINSE: 15.0000 mL | OROMUCOSAL | Status: DC | PRN

## 2023-08-16 MED ORDER — FENTANYL 2500MCG IN NS 250ML (10MCG/ML) PREMIX INFUSION
50.0000 ug/h | INTRAVENOUS | Status: DC
  Administered 2023-08-16: 50 ug/h via INTRAVENOUS
  Filled 2023-08-16: qty 250

## 2023-08-16 MED ORDER — FENTANYL CITRATE PF 50 MCG/ML IJ SOSY: 50.0000 ug | PREFILLED_SYRINGE | Freq: Once | INTRAMUSCULAR | Status: DC

## 2023-08-16 MED ORDER — FREE WATER
150.0000 mL | Status: DC
  Administered 2023-08-16 – 2023-08-18 (×10): 150 mL

## 2023-08-16 MED ORDER — VALPROATE SODIUM 100 MG/ML IV SOLN
500.0000 mg | Freq: Every day | INTRAVENOUS | Status: DC
  Administered 2023-08-16 – 2023-08-18 (×3): 500 mg via INTRAVENOUS
  Filled 2023-08-16 (×5): qty 5

## 2023-08-16 MED ORDER — POLYETHYLENE GLYCOL 3350 17 G PO PACK: 17.0000 g | PACK | Freq: Every day | ORAL | Status: DC

## 2023-08-16 MED ORDER — VANCOMYCIN HCL 1250 MG/250ML IV SOLN
1250.0000 mg | Freq: Once | INTRAVENOUS | Status: AC
  Administered 2023-08-16: 1250 mg via INTRAVENOUS
  Filled 2023-08-16: qty 250

## 2023-08-16 NOTE — Progress Notes (Signed)
APED RN informed this RN of lactic acid increase from 1.4 to 4.0. CCM Cloyd Stagers, [place] contacted and made aware of change.

## 2023-08-16 NOTE — Progress Notes (Signed)
Report called to RT covering 80M at [facility].

## 2023-08-16 NOTE — Progress Notes (Addendum)
Pharmacy Antibiotic Note  [name] is a 57 y.o. female admitted on 08/16/2023 with AMS, possible urosepsis/PNA.  Trach aspirate and Blood cultures growing GPC, BCID pending. Pharmacy has been consulted for Vancomycin dosing. Also on ceftriaxone/azithromycin.  Plan: Vancomycin 1250 mg IV given this AM, then Continue with Vancomycin 1250 mg IV q24h for estimated AUC 498 using SCr 0.87, Vd 0.72 Check vancomycin levels at steady state, goal AUC 400-550 F/u BCID results and narrow spectrum as able Follow up renal function, cultures as available, clinical progress, length of tx  Height: 5\' 2"  (157.5 cm) Weight: 68 kg (150 lb) IBW/kg (Calculated) : 50.1  Temp (24hrs), Avg:99.1 F (37.3 C), Min:97.5 F (36.4 C), Max:102.4 F (39.1 C)  Recent Labs  Lab 08/16/23 0205 08/16/23 0340 08/16/23 0656 08/16/23 0947  WBC 13.4*  --   --   --   CREATININE 0.99  --  0.87  --   LATICACIDVEN 1.5 4.0* 5.3* 5.0*    Estimated Creatinine Clearance: 64.5 mL/min (by C-G formula based on SCr of 0.87 mg/dL).    No Known Allergies   Loralee Pacas, PharmD, BCPS 08/16/2023 7:09 PM  Please check AMION for all [facility] Pharm[facility] phone numbers After 10:00 PM, call Main Pharmacy [phone]

## 2023-08-16 NOTE — Progress Notes (Signed)
Pharmacy Antibiotic Note  [name] is a 57 y.o. female admitted on 08/16/2023 with AMS, possible urosepsis/PNA.  Pharmacy has been consulted for Vancomycin dosing.  Plan: Vancomycin 1250 mg IV now, then Vancomycin 750 mg IV q12h  Height: 5\' 2"  (157.5 cm) Weight: 68 kg (150 lb) IBW/kg (Calculated) : 50.1  Temp (24hrs), Avg:100.1 F (37.8 C), Min:98.2 F (36.8 C), Max:102.4 F (39.1 C)  Recent Labs  Lab 08/16/23 0205 08/16/23 0340  WBC 13.4*  --   CREATININE 0.99  --   LATICACIDVEN 1.5 4.0*    Estimated Creatinine Clearance: 56.7 mL/min (by C-G formula based on SCr of 0.99 mg/dL).    No Known Allergies   Eddie Candle 08/16/2023 6:30 AM

## 2023-08-16 NOTE — ED Notes (Signed)
Patient transported to CT 

## 2023-08-16 NOTE — Progress Notes (Signed)
eLink Physician-Brief Progress Note Patient Name: [name] DOB: Apr 16, 1966 MRN: [id number]   Date of Service  08/16/2023  HPI/Events of Note  Patient admitted with altered mental status requiring intubation for airway protection, suspected sepsis secondary to UTI, volume depletion, and hypernatremia. Patient is on the ventilator.  eICU Interventions  New Patient Evaluation.        Thomasene Lot Veria Stradley 08/16/2023, 5:36 AM

## 2023-08-16 NOTE — Progress Notes (Signed)
Initial Nutrition Assessment  DOCUMENTATION CODES:   Non-severe (moderate) malnutrition in context of chronic illness  INTERVENTION:   Tube Feeding via OG: Vital AF 1.2 at 55 ml/hr Begin TF at rate of 20 ml/hr; titrate by 10 mL q 12 hours until goal rate of 55 ml/hr TF at goal provides   Monitor magnesium, potassium, and phosphorus BID for at least 3 days, MD to replete as needed, as pt is at risk for refeeding syndrome  Initiate free water per tube: start 150 mL q 4 hours starting tomorrow (currently receiving LR at 125 mlhr x 1 day). Total free water with TF at goal plus scheduled free water: 1696 mL of free water  Add Thiamine 100 mg daily x 7 days  B12 level low normal (typically serum values are high in acute illness/inflammation); recommend supplementing 1000 mcg daily per tube x 30 days given low risk for toxicity   NUTRITION DIAGNOSIS:   Moderate Malnutrition related to chronic illness as evidenced by mild fat depletion, severe muscle depletion, moderate muscle depletion.  GOAL:   Patient will meet greater than or equal to 90% of their needs  MONITOR:   TF tolerance, Vent status, Labs, Weight trends  REASON FOR ASSESSMENT:   Ventilator    ASSESSMENT:   57 yo admitted with septic shock secondary to RLL pneumonia, probable UTI, hypernatremia, shock liver, intubated. PMH includes HTN, HLD, CVA, epilepsy, depression, DM. Pt resides at SNF x 5 years, wheelchair bound  Pt remains on vent support Levophed OFF Propofol: 5 ml/hr (132 kcals in 24 hours) OG tube extends into stomach per imaging reports  Husband at bedside. Pt is edentulous; does not wear dentures. Eats soft foods per Husband. Pt has been at SNF x 5 years. Pt is wheel chair bound; husband indicates pt can stand with assistance, maybe take a step or 2  Nutrition focused physical exam reveals areas of loose skin and stretch marks; likely indicative of significant and/or rapid weight loss.   Husband  reports he believes patient as lost 80 pounds. Current wt 68 kg, noted wt of Husband does not believe she eats well at SNF when eating the food they prepare; he does indicate that she eats fairly well when they bring outside food to her.   Hx of DM, HgbA1c 5.0  Noted B12 low normal at 269. Folate 7.1 (wdl), Iron 20 (L) Lactic Acid  Noted hypernatremia/hypovolemia with LR at 125 ml/hr x 1 day  Pt currently meets clinical characteristics for moderate malnutrition but may be more severe.   Labs: sodium 156 (H), Creatinine 0.87, BUN 40 (H), potassium 3.9 (wdl), phosphorus 3.2 (wdl), magnesium 1.8 (wdl) Meds:    NUTRITION - FOCUSED PHYSICAL EXAM: Wheel Chair Progress Energy Most Recent Value  Orbital Region Mild depletion  Upper Arm Region Mild depletion  [noted signficant sretch marks down bilateral upper arms, shoulders likely indicative of significant and/or rapid wt loss]  Thoracic and Lumbar Region Unable to assess  Buccal Region Unable to assess  Temple Region Mild depletion  Clavicle Bone Region Severe depletion  Clavicle and Acromion Bone Region Severe depletion  Scapular Bone Region Severe depletion  Dorsal Hand Unable to assess  Patellar Region Severe depletion  Anterior Thigh Region Severe depletion  Posterior Calf Region Severe depletion  Edema (RD Assessment) None       Diet Order:   Diet Order             Diet NPO time specified  Diet  effective now                   EDUCATION NEEDS:   Not appropriate for education at this time  Skin:  Skin Assessment: Skin Integrity Issues: Skin Integrity Issues:: DTI DTI: L heel  Last BM:  PTA, no BM, abdomen soft  Height:   Ht Readings from Last 1 Encounters:  08/16/23 5\' 2"  (1.575 m)    Weight:   Wt Readings from Last 1 Encounters:  08/16/23 68 kg     BMI:  Body mass index is 27.44 kg/m.  Estimated Nutritional Needs:   Kcal:  1600-1800 kcals  Protein:  90-110 g  Fluid:  >/= 1.8  L   [name] MS, RDN, LDN, CNSC Registered Dietitian 3 Clinical Nutrition RD Pager and On-Call Pager Number Located in [place]

## 2023-08-16 NOTE — Sepsis Progress Note (Signed)
Elink monitoring for the code sepsis protocol.  

## 2023-08-16 NOTE — Sepsis Progress Note (Addendum)
Notified provider of need to order 3rd lactic acid.   0630: Notified Bedside RN of need to draw 3rd LA, patient is a lab draw and they are awaiting lab to come now to draw it.

## 2023-08-16 NOTE — Progress Notes (Signed)
57 year old female with prior stroke, nursing home resident who was transferred from [facility] in the setting of septic shock and acute respiratory failure with hypoxia requiring endotracheal intubation  Patient came off of vasopressor support this morning She continued to have thick greenish secretions via ET tube Became afebrile this morning    Physical exam: General: Crtitically ill-appearing female, orally intubated HEENT: [place]/AT, eyes anicteric.  ETT and OGT in place Neuro: Sedated, not following commands.  Eyes are closed.  Pupils 3 mm bilateral reactive to light Chest: Bilateral basal crackles right more than left Heart: Regular rate and rhythm, no murmurs or gallops Abdomen: Soft, nondistended, bowel sounds present Skin: No rash  Labs and images were reviewed  Assessment plan: Septic shock due to right lower lobe pneumonia Probable acute urinary tract infection Acute respiratory failure with hypoxia Lactic acidosis Hypernatremia Shock liver Diabetes type 2 with hyperglycemia Anemia of chronic disease  Vasopressors were titrated off this morning Continue IV antibiotics Follow-up respiratory and urine culture Continue lung protective ventilation FiO2 was titrated down to 60% and PEEP of 5 Trend ABGs Lactate is going up, Will give 1 L of IV fluid bolus Monitor intake and output Closely monitor electrolytes LFTs are improving Increase sliding scale insulin Iron studies are consistent with anemia of chronic disease   The patient is critically ill due to acute respiratory failure with hypoxia/septic shock.  Critical care was necessary to treat or prevent imminent or life-threatening deterioration.  Critical care was time spent personally by me on the following activities: development of treatment plan with patient and/or surrogate as well as nursing, discussions with consultants, evaluation of patient's response to treatment, examination of patient, obtaining  history from patient or surrogate, ordering and performing treatments and interventions, ordering and review of laboratory studies, ordering and review of radiographic studies, pulse oximetry, re-evaluation of patient's condition and participation in multidisciplinary rounds.   During this encounter critical care time was devoted to patient care services described in this note for 44 minutes.     Cheri Fowler, MD [facility] See Amion for pager If no response to pager, please call [phone] until 7pm After 7pm, Please call E-link [phone]

## 2023-08-16 NOTE — Progress Notes (Signed)
Pt transported on the ventilator from 15M 09 to CT2 and back without complication. RT, RN and transport accompanied pt.

## 2023-08-16 NOTE — Progress Notes (Signed)
Patient transported to CT scanner then back to ED bed 2 on ventilator. VSS throughout no adverse events noted.

## 2023-08-16 NOTE — Progress Notes (Signed)
RT NOTE: patient does not meet SBT criteria this AM due to PEEP and FIO2 requirements.  Tolerating current ventilator settings well at this time.  Will continue to monitor.

## 2023-08-16 NOTE — Progress Notes (Signed)
Per ABG results no changes made at this time.

## 2023-08-16 NOTE — Progress Notes (Signed)
ABG collected and taken to lab.

## 2023-08-16 NOTE — ED Notes (Signed)
Pt taken to CT with this RN and RT on the monitor.

## 2023-08-16 NOTE — ED Notes (Signed)
Pt back from CT

## 2023-08-16 NOTE — Progress Notes (Signed)
  Echocardiogram 2D Echocardiogram has been performed.  [name] 08/16/2023, 10:19 AM

## 2023-08-16 NOTE — ED Provider Notes (Signed)
AP-EMERGENCY DEPT [facility] Emergency Department Provider Note MRN:  [id number]  Arrival date & time: 08/16/23     Chief Complaint   Altered Mental Status   History of Present Illness   [name] is a 57 y.o. year-old female with a history of hypertension, diabetes, stroke presenting to the ED with chief complaint of altered mental status.  Worsening mental status and deterioration for the past few days at care facility.  Febrile up to 102 this evening.  Patient is largely unresponsive.  Review of Systems  I was unable to obtain a full/accurate HPI, PMH, or ROS due to the patient's altered mental status.  Patient's Health History    Past Medical History:  Diagnosis Date   Allergic rhinitis    Chickenpox    Depression    Diabetes (HCC)    Epilepsy (HCC)    Headache    HTN (hypertension)    Hyperlipidemia    Seizures (HCC)    Stroke (HCC)    Residual L-sided deficits   Urinary incontinence     Past Surgical History:  Procedure Laterality Date   CESAREAN SECTION     INCISION AND DRAINAGE Right 05/13/2016   Procedure: INCISION AND DRAINAGE;  Surgeon: Gwyneth Revels, DPM;  Location: [facility] ORS;  Service: Podiatry;  Laterality: Right;    Family History  Problem Relation Age of Onset   Hypertension Mother    Diabetes Mellitus II Mother    Hypertension Father    Pancreatic cancer Father     Social History   Socioeconomic History   Marital status: Married    Spouse name: Not on file   Number of children: Not on file   Years of education: Not on file   Highest education level: Not on file  Occupational History   Not on file  Tobacco Use   Smoking status: Never   Smokeless tobacco: Never  Substance and Sexual Activity   Alcohol use: No    Alcohol/week: 0.0 standard drinks of alcohol   Drug use: No   Sexual activity: Not on file  Other Topics Concern   Not on file  Social History Narrative   Not on file   Social Determinants of Health   Financial  Resource Strain: Not on file  Food Insecurity: Not on file  Transportation Needs: Not on file  Physical Activity: Not on file  Stress: Not on file  Social Connections: Not on file  Intimate Partner Violence: Not on file     Physical Exam   Vitals:   08/16/23 0600 08/16/23 0615  BP: 97/86 105/86  Pulse: (!) 101 97  Resp: 20 20  Temp:    SpO2: 99% 97%    CONSTITUTIONAL: Ill-appearing, NAD NEURO/PSYCH: Minimally responsive, occasionally withdrawing to noxious stimuli, little to no gag reflex EYES:  eyes equal and reactive ENT/NECK:  no LAD, no JVD CARDIO: Tachycardic rate, well-perfused, normal S1 and S2 PULM:  CTAB no wheezing or rhonchi GI/GU:  non-distended, non-tender MSK/SPINE:  No gross deformities, no edema SKIN:  no rash, atraumatic   *Additional and/or pertinent findings included in MDM below  Diagnostic and Interventional Summary    EKG Interpretation Date/Time:  Friday August 16 2023 02:54:22 EST Ventricular Rate:  106 PR Interval:  126 QRS Duration:  91 QT Interval:  334 QTC Calculation: 444 R Axis:   42  Text Interpretation: Sinus tachycardia Borderline T abnormalities, lateral leads Confirmed by Kennis Carina [phone]) on 08/16/2023 2:55:47 AM  Labs Reviewed  LACTIC ACID, PLASMA - Abnormal; Notable for the following components:      Result Value   Lactic Acid, Venous 4.0 (*)    All other components within normal limits  COMPREHENSIVE METABOLIC PANEL - Abnormal; Notable for the following components:   Sodium 156 (*)    Chloride 118 (*)    Glucose, Bld 222 (*)    BUN 45 (*)    Albumin 2.9 (*)    AST 78 (*)    ALT 74 (*)    All other components within normal limits  CBC WITH DIFFERENTIAL/PLATELET - Abnormal; Notable for the following components:   WBC 13.4 (*)    Hemoglobin 9.8 (*)    HCT 31.1 (*)    MCV 73.0 (*)    MCH 23.0 (*)    RDW 19.9 (*)    Neutro Abs 8.8 (*)    Monocytes Absolute 1.9 (*)    Abs Immature Granulocytes 0.08 (*)     All other components within normal limits  URINALYSIS, W/ REFLEX TO CULTURE (INFECTION SUSPECTED) - Abnormal; Notable for the following components:   APPearance CLOUDY (*)    Protein, ur 30 (*)    Nitrite POSITIVE (*)    Leukocytes,Ua TRACE (*)    Bacteria, UA MANY (*)    All other components within normal limits  BLOOD GAS, ARTERIAL - Abnormal; Notable for the following components:   Bicarbonate 29.2 (*)    Acid-Base Excess 4.7 (*)    All other components within normal limits  GLUCOSE, CAPILLARY - Abnormal; Notable for the following components:   Glucose-Capillary 196 (*)    All other components within normal limits  CBG MONITORING, ED - Abnormal; Notable for the following components:   Glucose-Capillary 202 (*)    All other components within normal limits  CBG MONITORING, ED - Abnormal; Notable for the following components:   Glucose-Capillary 202 (*)    All other components within normal limits  RESP PANEL BY RT-PCR (RSV, FLU A&B, COVID)  RVPGX2  CULTURE, BLOOD (ROUTINE X 2)  CULTURE, BLOOD (ROUTINE X 2)  MRSA NEXT GEN BY PCR, NASAL  URINE CULTURE  CULTURE, RESPIRATORY W GRAM STAIN  LACTIC ACID, PLASMA  PROTIME-INR  APTT  PREGNANCY, URINE  LACTIC ACID, PLASMA  LACTIC ACID, PLASMA  CBC  COMPREHENSIVE METABOLIC PANEL  MAGNESIUM  PHOSPHORUS  PROCALCITONIN  IRON AND TIBC  FERRITIN  VITAMIN B12  FOLATE  LEGIONELLA PNEUMOPHILA SEROGP 1 UR AG  STREP PNEUMONIAE URINARY ANTIGEN  BLOOD GAS, ARTERIAL  HEMOGLOBIN A1C    DG CHEST PORT 1 VIEW  Final Result    CT HEAD WO CONTRAST ( )  Final Result    DG Chest Port 1 View  Final Result    DG Chest Port 1 View    (Results Pending)  CT CHEST ABDOMEN PELVIS W CONTRAST    (Results Pending)  CT CHEST W CONTRAST    (Results Pending)    Medications  propofol (DIPRIVAN) 1000 MG/100ML infusion (40 mcg/kg/min  68 kg Intravenous Infusion Verify 08/16/23 0600)  heparin injection 5,000 Units (has no administration in time  range)  pantoprazole (PROTONIX) injection 40 mg (has no administration in time range)  insulin aspart (novoLOG) injection 0-9 Units (2 Units Subcutaneous Given 08/16/23 0536)  Chlorhexidine Gluconate Cloth 2 % PADS 6 each (6 each Topical Given 08/16/23 0523)  Oral care mouth rinse (15 mLs Mouth Rinse Given 08/16/23 8657)  Oral care mouth rinse (has no administration in time range)  docusate (COLACE) 50 MG/5ML liquid 100 mg (has no administration in time range)  polyethylene glycol (MIRALAX / GLYCOLAX) packet 17 g (has no administration in time range)  0.9 %  sodium chloride infusion (has no administration in time range)  norepinephrine (LEVOPHED) 4mg  in (0.016 mg/mL) premix infusion (0 mcg/min Intravenous Rate/Dose Change 08/16/23 0627)  acetaminophen (TYLENOL) tablet 650 mg (has no administration in time range)  valproate (DEPACON) 250 mg in dextrose 5 % 50 mL IVPB (has no administration in time range)    And  valproate (DEPACON) 500 mg in dextrose 5 % 50 mL IVPB (has no administration in time range)  lactated ringers infusion ( Intravenous New Bag/Given 08/16/23 0636)  ipratropium-albuterol (DUONEB) 0.5-2.5 (3) MG/3ML nebulizer solution 3 mL (has no administration in time range)  acetylcysteine (MUCOMYST) 20 % nebulizer / oral solution 2 mL (has no administration in time range)  fentaNYL (SUBLIMAZE) injection 50 mcg (has no administration in time range)  fentaNYL in NS (22mcg/ml) infusion-PREMIX (50 mcg/hr Intravenous New Bag/Given 08/16/23 0634)  fentaNYL (SUBLIMAZE) bolus via infusion 50-100 mcg (has no administration in time range)  azithromycin (ZITHROMAX) 500 mg in sodium chloride 0.9 % 250 mL IVPB (has no administration in time range)  vancomycin (VANCOREADY) IVPB 1250 mg/250 mL (has no administration in time range)  cefTRIAXone (ROCEPHIN) 2 g in sodium chloride 0.9 % 100 mL IVPB (has no administration in time range)  vancomycin (VANCOREADY) IVPB 750 mg/150 mL (has  no administration in time range)  lactated ringers bolus 3,600 mL (0 mLs Intravenous Stopped 08/16/23 0352)  cefTRIAXone (ROCEPHIN) 2 g in sodium chloride 0.9 % 100 mL IVPB (0 g Intravenous Stopped 08/16/23 0246)  rocuronium (ZEMURON) 100 MG/10ML injection (  Given by Other 08/16/23 0215)  etomidate (AMIDATE) 2 MG/ML injection (  Given by Other 08/16/23 0214)  fentaNYL (SUBLIMAZE) injection 50 mcg (50 mcg Intravenous Given 08/16/23 0416)     Procedures  /  Critical Care .Critical Care  Performed by: Sabas Sous, MD Authorized by: Sabas Sous, MD   Critical care provider statement:    Critical care time (minutes):  85   Critical care was necessary to treat or prevent imminent or life-threatening deterioration of the following conditions:  Sepsis   Critical care was time spent personally by me on the following activities:  Development of treatment plan with patient or surrogate, discussions with consultants, evaluation of patient's response to treatment, examination of patient, ordering and review of laboratory studies, ordering and review of radiographic studies, ordering and performing treatments and interventions, pulse oximetry, re-evaluation of patient's condition and review of old charts Procedure Name: Intubation Date/Time: 08/16/2023 2:53 AM  Performed by: Sabas Sous, MDPre-anesthesia Checklist: Patient identified, Patient being monitored, Emergency Drugs available, Timeout performed and Suction available Oxygen Delivery Method: Non-rebreather mask Preoxygenation: Pre-oxygenation with 100% oxygen Induction Type: Rapid sequence Ventilation: Mask ventilation without difficulty Laryngoscope Size: Mac and 4 Grade View: Grade I Tube size: 7.5 mm Number of attempts: 1 Airway Equipment and Method: Stylet Placement Confirmation: ETT inserted through vocal cords under direct vision, CO2 detector and Breath sounds checked- equal and bilateral Secured at: 23 cm Tube secured  with: ETT holder Comments: RSI intubation for airway protection in the setting of likely sepsis.  10 mg etomidate, 100 mg rocuronium.  ET tube appears slightly low on chest x-ray, pulled back 2 cm.      ED Course and Medical Decision Making  Initial Impression and Ddx Concern for  sepsis, other considerations include intracranial bleeding given her profound altered mental status.  Intubated for airway protection.  Code sepsis, providing 30 cc/kg fluid bolus, empiric antibiotics, looking for infectious source.  Past medical/surgical history that increases complexity of ED encounter: History of stroke  Interpretation of Diagnostics I personally reviewed the EKG and my interpretation is as follows: Sinus tachycardia with nonspecific findings  Labs reveal mild leukocytosis, mild elevated lactic acid, hyponatremia, acute kidney injury, urinalysis consistent with infection.  Patient Reassessment and Ultimate Disposition/Management     Accepted for admission by the ICU [place].  Dr. Lonzo Candy is the accepting doctor for the transfer.  Patient management required discussion with the following services or consulting groups:  Intensivist Service  Complexity of Problems Addressed Acute illness or injury that poses threat of life of bodily function  Additional Data Reviewed and Analyzed Further history obtained from: Further history from spouse/family member  Additional Factors Impacting ED Encounter Risk Consideration of hospitalization and Major procedures  Elmer Sow. Pilar Plate, MD [facility] Emergency Medicine [facility] [email or web address]  Final Clinical Impressions(s) / ED Diagnoses     ICD-10-CM   1. Altered mental status, unspecified altered mental status type  R41.82     2. Sepsis, due to unspecified organism, unspecified whether acute organ dysfunction present [facility])  A41.9       ED Discharge Orders     None        Discharge Instructions Discussed  with and Provided to Patient:   Discharge Instructions   None      Sabas Sous, MD 08/16/23 [phone]

## 2023-08-16 NOTE — ED Triage Notes (Signed)
[facility] [place] for [facility]. Per Ems, the patient has had decreased responsiveness over the course of the day. GCS of 6 with EMS. Pt had a axillary temp of 101.2 with associated tachycardia.   CBG- 238 22G IV L hand

## 2023-08-16 NOTE — H&P (Addendum)
NAME:  [name], MRN:  [id number], DOB:  1966-05-22, LOS: 0 ADMISSION DATE:  08/16/2023 CONSULTATION DATE: 08/16/2023 REFERRING MD:  Pilar Plate - EDP CHIEF COMPLAINT: AMS, sepsis   History of Present Illness:  57 year old woman who presented to [facility] 11/29 as a transfer from [facility] for AMS, suspected urosepsis. PMHx significant for HTN, HLD, CVA (moderate-large R MCA infarct), T2DM, epilepsy, depression.   Patient is a resident of the [facility]) and had decreased responsiveness throughout the course of the day. EMS noted GCS 6, fever and tachycardia. Labs were notable for WBC 13.4, Hgb 9.8, Plt 286. INR 1.1. Na 156, K 3.9, CO2 27, Cr 0.99 (baseline 0.8-1), mildly elevated transaminases 78/74. LA 1.5 > 4.0. ABG 7.42/46/100/29.2. COVID/Flu/RSV negative. UA +protein, nitrites, trace leuks. CXR without active disease. 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 Patient was intubated at [facility] for airway protection.  PCCM consulted for ICU admission and transfer.  Pertinent Medical History:   Past Medical History:  Diagnosis Date   Allergic rhinitis    Chickenpox    Depression    Diabetes (HCC)    Epilepsy (HCC)    Headache    HTN (hypertension)    Hyperlipidemia    Seizures (HCC)    Stroke (HCC)    Residual L-sided deficits   Urinary incontinence    Significant Hospital Events: Including procedures, antibiotic start and stop dates in addition to other pertinent events   11/29 - Presented to [facility] for AMS. Not protecting airway and intubated in ED. PCCM consulted for ICU admission and transfer to [facility].  Interim History / Subjective:  PCCM consulted for ICU admission and transfer.  Objective:  Blood pressure (!) 154/93, pulse (!) 102, temperature 99.8 F (37.7 C), temperature source Rectal, resp. rate 16, height 5\' 2"  (1.575 m), weight 68 kg, SpO2 100%.    Vent Mode:  PRVC FiO2 (%):  [80 %] 80 % Set Rate:  [20 bmp] 20 bmp Vt Set:  [400 mL] 400 mL PEEP:  [5 cmH20] 5 cmH20 Plateau Pressure:  [17 cmH20-18 cmH20] 18 cmH20   Intake/Output Summary (Last 24 hours) at 08/16/2023 0457 Last data filed at 08/16/2023 0246 Gross per 24 hour  Intake 100 ml  Output --  Net 100 ml   Filed Weights   08/16/23 0216  Weight: 68 kg   Physical Examination: General: Acutely ill-appearing middle-aged woman in NAD. HEENT: [place]/AT, anicteric sclera, PERRL 2mm sluggish, moist mucous membranes with increased clear oral secretions. Neuro:  Intubated, sedated.  Responds to noxious stimuli. Withdraws to pain in all 4 extremities, weakly in BUE, more briskly in BLE. Not following commands. No spontaneous movement of extremities noted on my exam. +Corneal, +strong cough, and +gag. CV: RRR, no m/g/r. PULM: Breathing even and unlabored on PEEP 5, FiO2 100%. Lung fields coarse/rhonchorous. GI: Soft, nontender, nondistended. Normoactive bowel sounds. Extremities: No LE edema noted. Skin: Warm/dry, no rashes. Small blister noted to coccyx/sacral region.  Resolved Hospital Problem List:    Assessment & Plan:  Undifferentiated shock, presume urosepsis vs PNA - Admit to ICU for close monitoring - Goal MAP > 65 - Fluid resuscitation as tolerated - If unable to maintain goal MAP, begin peripheral Levophed - Trend WBC, fever curve, LA - F/u Cx data - Continue broad-spectrum antibiotics (ceftriaxone/azithromycin/vanc) - F/u CT Chest/A/P  Acute hypoxemic respiratory failure - Continue full vent support (4-8cc/kg IBW) - Wean FiO2 for O2 sat >  90% - Daily WUA/SBT - VAP bundle - Pulmonary hygiene - PAD protocol for sedation: Propofol and Fentanyl for goal RASS 0 to -1 - F/u CXR  HTN HLD - Hold home antihypertensives for now in the setting of sedation - Resume as clinically appropriate - Resume statin once LFTs normalized - Confirm if still on  Plavix  Epilepsy CVA Moderate-large R MCA infarct, present prior to 03/2014. Follows with [facility]. Multiple admissions for [name]/CVA, last 03/2020. CT Head NAICA, chronic/remote changes present. - Continue home Depakote - Consider EEG - No role for repeat brain imaging at present, if mental status does not improve would revisit  T2DM - SSI - CBGs Q4H - Goal CBG 140-180  Best Practice: (right click and "Reselect all SmartList Selections" daily)   Diet/type: NPO DVT prophylaxis: SCDs GI prophylaxis: PPI Lines: N/A Foley:  Yes, and it is still needed Code Status:  full code Last date of multidisciplinary goals of care discussion [Pending]  Labs:  CBC: Recent Labs  Lab 08/16/23 0205  WBC 13.4*  NEUTROABS 8.8*  HGB 9.8*  HCT 31.1*  MCV 73.0*  PLT 286   Basic Metabolic Panel: Recent Labs  Lab 08/16/23 0205  NA 156*  K 3.9  CL 118*  CO2 27  GLUCOSE 222*  BUN 45*  CREATININE 0.99  CALCIUM 9.4   GFR: Estimated Creatinine Clearance: 56.7 mL/min (by C-G formula based on SCr of 0.99 mg/dL). Recent Labs  Lab 08/16/23 0205 08/16/23 0340  WBC 13.4*  --   LATICACIDVEN 1.5 4.0*   Liver Function Tests: Recent Labs  Lab 08/16/23 0205  AST 78*  ALT 74*  ALKPHOS 40  BILITOT 0.8  PROT 6.6  ALBUMIN 2.9*   No results for input(s): "LIPASE", "AMYLASE" in the last 168 hours. No results for input(s): "AMMONIA" in the last 168 hours.  ABG:    Component Value Date/Time   PHART 7.42 08/16/2023 0348   PCO2ART 46 08/16/2023 0348   PO2ART 100 08/16/2023 0348   HCO3 29.2 (H) 08/16/2023 0348   O2SAT 98.5 08/16/2023 0348    Coagulation Profile: Recent Labs  Lab 08/16/23 0205  INR 1.1   Cardiac Enzymes: No results for input(s): "CKTOTAL", "CKMB", "CKMBINDEX", "TROPONINI" in the last 168 hours.  HbA1C: Hemoglobin A1C  Date/Time Value Ref Range Status  08/21/2014 02:40 PM 11.1 (H) 4.2 - 6.3 % Final    Comment:    The American Diabetes Association  recommends that a primary goal of therapy should be <7% and that physicians should reevaluate the treatment regimen in patients with HbA1c values consistently >8%.   02/11/2013 08:04 PM 13.3 (H) 4.2 - 6.3 % Final    Comment:    The American Diabetes Association recommends that a primary goal of therapy should be <7% and that physicians should reevaluate the treatment regimen in patients with HbA1c values consistently >8%.    Hgb A1c MFr Bld  Date/Time Value Ref Range Status  02/24/2020 07:12 PM 7.7 (H) 4.8 - 5.6 % Final    Comment:    (NOTE) Pre diabetes:          5.7%-6.4%  Diabetes:              >6.4%  Glycemic control for   <7.0% adults with diabetes   12/20/2019 08:27 PM 7.9 (H) 4.8 - 5.6 % Final    Comment:    (NOTE) Pre diabetes:          5.7%-6.4% Diabetes:              >  6.4% Glycemic control for   <7.0% adults with diabetes    CBG: Recent Labs  Lab 08/16/23 0203 08/16/23 0415  GLUCAP 202* 202*   Review of Systems:   Patient is encephalopathic and/or intubated; therefore, history has been obtained from chart review.   Past Medical History:  She,  has a past medical history of Allergic rhinitis, Chickenpox, Depression, Diabetes (HCC), Epilepsy (HCC), Headache, HTN (hypertension), Hyperlipidemia, Seizures (HCC), Stroke (HCC), and Urinary incontinence.   Surgical History:   Past Surgical History:  Procedure Laterality Date   CESAREAN SECTION     INCISION AND DRAINAGE Right 05/13/2016   Procedure: INCISION AND DRAINAGE;  Surgeon: Gwyneth Revels, DPM;  Location: [facility] ORS;  Service: Podiatry;  Laterality: Right;    Social History:   reports that she has never smoked. She has never used smokeless tobacco. She reports that she does not drink alcohol and does not use drugs.   Family History:  Her family history includes Diabetes Mellitus II in her mother; Hypertension in her father and mother; Pancreatic cancer in her father.   Allergies: No Known Allergies    Home Medications: Prior to Admission medications   Medication Sig Start Date End Date Taking? Authorizing Provider  acetaminophen (TYLENOL) 325 MG tablet Take 2 tablets (650 mg total) by mouth every 6 (six) hours as needed for mild pain (or Fever >/= 101). 03/28/20   Lynn Ito, MD  amLODipine (NORVASC) 10 MG tablet Take 1 tablet (10 mg total) by mouth daily. 03/29/20   Lynn Ito, MD  aspirin EC 81 MG EC tablet Take 1 tablet (81 mg total) by mouth daily. Swallow whole. 03/04/20   Glade Lloyd, MD  atorvastatin (LIPITOR) 40 MG tablet Take 1 tablet (40 mg total) by mouth daily at 6 PM. 08/16/16   Ghimire, Werner Lean, MD  clopidogrel (PLAVIX) 75 MG tablet Take 75 mg by mouth daily. 12/22/19   [provider]  divalproex (DEPAKOTE) 250 MG DR tablet Take 1 tablet (250 mg total) by mouth daily. 03/29/20   Lynn Ito, MD  divalproex (DEPAKOTE) 500 MG DR tablet Take 1 tablet (500 mg total) by mouth at bedtime. 03/28/20   Lynn Ito, MD  insulin detemir (LEVEMIR FLEXPEN) 100 UNIT/ML FlexPen Inject 35 Units into the skin daily at 10 pm. 03/03/20   Glade Lloyd, MD  lisinopril (ZESTRIL) 40 MG tablet Take 1 tablet (40 mg total) by mouth daily. 03/29/20   Lynn Ito, MD  metFORMIN (GLUCOPHAGE-XR) 500 MG 24 hr tablet Take 500 mg by mouth 2 (two) times daily. 08/23/19   [provider]  metoprolol tartrate (LOPRESSOR) 25 MG tablet Take 25 mg by mouth 2 (two) times a day.    [provider]  polyethylene glycol (MIRALAX / GLYCOLAX) 17 g packet Take 17 g by mouth daily. 03/29/20   Lynn Ito, MD  saccharomyces boulardii (FLORASTOR) 250 MG capsule Take 250 mg by mouth 2 (two) times daily.    [provider]  senna-docusate (SENOKOT-S) 8.6-50 MG tablet Take 1 tablet by mouth 2 (two) times daily. 03/28/20   Lynn Ito, MD  VESICARE 10 MG tablet Take 10 mg by mouth 2 (two) times daily. 05/26/19   [provider]    Critical care time:    The patient is critically  ill with multiple organ system failure and requires high complexity decision making for assessment and support, frequent evaluation and titration of therapies, advanced monitoring, review of radiographic studies and interpretation of complex data.  Critical Care Time devoted to patient care services, exclusive of separately billable procedures, described in this note is 39 minutes.  Tim Lair, PA-C [facility] 08/16/23 4:57 AM  Please see [email or web address] for pager details.  From 7A-7P if no response, please call [phone] After hours, please call ELink [phone]

## 2023-08-16 NOTE — ED Notes (Signed)
Family updated as to patient's status.

## 2023-08-17 ENCOUNTER — Inpatient Hospital Stay (HOSPITAL_COMMUNITY): Payer: BC Managed Care – PPO

## 2023-08-17 DIAGNOSIS — R579 Shock, unspecified: Secondary | ICD-10-CM

## 2023-08-17 DIAGNOSIS — A419 Sepsis, unspecified organism: Secondary | ICD-10-CM | POA: Diagnosis not present

## 2023-08-17 DIAGNOSIS — D649 Anemia, unspecified: Secondary | ICD-10-CM

## 2023-08-17 DIAGNOSIS — R6521 Severe sepsis with septic shock: Secondary | ICD-10-CM | POA: Diagnosis not present

## 2023-08-17 DIAGNOSIS — J9601 Acute respiratory failure with hypoxia: Secondary | ICD-10-CM | POA: Diagnosis not present

## 2023-08-17 LAB — COMPREHENSIVE METABOLIC PANEL
ALT: 35 U/L (ref 0–44)
AST: 24 U/L (ref 15–41)
Albumin: 2 g/dL — ABNORMAL LOW (ref 3.5–5.0)
Alkaline Phosphatase: 34 U/L — ABNORMAL LOW (ref 38–126)
Anion gap: 8 (ref 5–15)
BUN: 24 mg/dL — ABNORMAL HIGH (ref 6–20)
CO2: 26 mmol/L (ref 22–32)
Calcium: 8.2 mg/dL — ABNORMAL LOW (ref 8.9–10.3)
Chloride: 116 mmol/L — ABNORMAL HIGH (ref 98–111)
Creatinine, Ser: 0.67 mg/dL (ref 0.44–1.00)
GFR, Estimated: >60 mL/min (ref >60)
Glucose, Bld: 198 mg/dL — ABNORMAL HIGH (ref 70–99)
Potassium: 3.2 mmol/L — ABNORMAL LOW (ref 3.5–5.1)
Sodium: 150 mmol/L — ABNORMAL HIGH (ref 135–145)
Total Bilirubin: 0.3 mg/dL (ref <1.2)
Total Protein: 4.7 g/dL — ABNORMAL LOW (ref 6.5–8.1)

## 2023-08-17 LAB — HEMOGLOBIN AND HEMATOCRIT, BLOOD
HCT: 27.6 % — ABNORMAL LOW (ref 36.0–46.0)
Hemoglobin: 8.3 g/dL — ABNORMAL LOW (ref 12.0–15.0)

## 2023-08-17 LAB — URINE CULTURE: Culture: NO GROWTH

## 2023-08-17 LAB — TRIGLYCERIDES: Triglycerides: 89 mg/dL (ref <150)

## 2023-08-17 LAB — CBC
HCT: 22.7 % — ABNORMAL LOW (ref 36.0–46.0)
HCT: 22.3 % — ABNORMAL LOW (ref 36.0–46.0)
Hemoglobin: 7 g/dL — ABNORMAL LOW (ref 12.0–15.0)
Hemoglobin: 6.8 g/dL — CL (ref 12.0–15.0)
MCH: 22.4 pg — ABNORMAL LOW (ref 26.0–34.0)
MCH: 22 pg — ABNORMAL LOW (ref 26.0–34.0)
MCHC: 30.8 g/dL (ref 30.0–36.0)
MCHC: 30.5 g/dL (ref 30.0–36.0)
MCV: 72.5 fL — ABNORMAL LOW (ref 80.0–100.0)
MCV: 72.2 fL — ABNORMAL LOW (ref 80.0–100.0)
Platelets: 184 10*3/uL (ref 150–400)
Platelets: 182 10*3/uL (ref 150–400)
RBC: 3.13 MIL/uL — ABNORMAL LOW (ref 3.87–5.11)
RBC: 3.09 MIL/uL — ABNORMAL LOW (ref 3.87–5.11)
RDW: 19.4 % — ABNORMAL HIGH (ref 11.5–15.5)
RDW: 19.5 % — ABNORMAL HIGH (ref 11.5–15.5)
WBC: 16.4 10*3/uL — ABNORMAL HIGH (ref 4.0–10.5)
WBC: 13.3 10*3/uL — ABNORMAL HIGH (ref 4.0–10.5)
nRBC: 0.2 % (ref 0.0–0.2)
nRBC: 0 % (ref 0.0–0.2)

## 2023-08-17 LAB — GLUCOSE, CAPILLARY
Glucose-Capillary: 183 mg/dL — ABNORMAL HIGH (ref 70–99)
Glucose-Capillary: 190 mg/dL — ABNORMAL HIGH (ref 70–99)
Glucose-Capillary: 188 mg/dL — ABNORMAL HIGH (ref 70–99)
Glucose-Capillary: 180 mg/dL — ABNORMAL HIGH (ref 70–99)
Glucose-Capillary: 141 mg/dL — ABNORMAL HIGH (ref 70–99)

## 2023-08-17 LAB — MAGNESIUM
Magnesium: 1.4 mg/dL — ABNORMAL LOW (ref 1.7–2.4)
Magnesium: 2.3 mg/dL (ref 1.7–2.4)

## 2023-08-17 LAB — LACTIC ACID, PLASMA: Lactic Acid, Venous: 3.5 mmol/L (ref 0.5–1.9)

## 2023-08-17 LAB — PREPARE RBC (CROSSMATCH)

## 2023-08-17 LAB — PHOSPHORUS
Phosphorus: 1.6 mg/dL — ABNORMAL LOW (ref 2.5–4.6)
Phosphorus: 4.5 mg/dL (ref 2.5–4.6)
Phosphorus: 4.4 mg/dL (ref 2.5–4.6)

## 2023-08-17 LAB — ABO/RH: ABO/RH(D): O NEG

## 2023-08-17 MED ORDER — SODIUM CHLORIDE 0.9% IV SOLUTION: Freq: Once | INTRAVENOUS | Status: AC

## 2023-08-17 MED ORDER — DEXMEDETOMIDINE HCL IN NACL 400 MCG/100ML IV SOLN
0.0000 ug/kg/h | INTRAVENOUS | Status: DC
  Filled 2023-08-17 (×2): qty 100

## 2023-08-17 MED ORDER — POTASSIUM PHOSPHATES 15 MMOLE/5ML IV SOLN
45.0000 mmol | Freq: Once | INTRAVENOUS | Status: AC
  Administered 2023-08-17: 45 mmol via INTRAVENOUS
  Filled 2023-08-17: qty 15

## 2023-08-17 MED ORDER — INSULIN ASPART 100 UNIT/ML IJ SOLN
0.0000 [IU] | INTRAMUSCULAR | Status: DC
  Administered 2023-08-17 (×3): 4 [IU] via SUBCUTANEOUS
  Administered 2023-08-18 (×2): 3 [IU] via SUBCUTANEOUS
  Administered 2023-08-18 (×2): 4 [IU] via SUBCUTANEOUS
  Administered 2023-08-19 (×2): 3 [IU] via SUBCUTANEOUS
  Administered 2023-08-20 (×3): 4 [IU] via SUBCUTANEOUS
  Administered 2023-08-20: 7 [IU] via SUBCUTANEOUS
  Administered 2023-08-20: 4 [IU] via SUBCUTANEOUS
  Administered 2023-08-20: 3 [IU] via SUBCUTANEOUS
  Administered 2023-08-21: 4 [IU] via SUBCUTANEOUS
  Administered 2023-08-21: 7 [IU] via SUBCUTANEOUS
  Administered 2023-08-21: 4 [IU] via SUBCUTANEOUS
  Administered 2023-08-21 – 2023-08-22 (×3): 7 [IU] via SUBCUTANEOUS
  Administered 2023-08-22: 3 [IU] via SUBCUTANEOUS
  Administered 2023-08-22: 7 [IU] via SUBCUTANEOUS

## 2023-08-17 MED ORDER — MAGNESIUM SULFATE 4 GM/100ML IV SOLN
4.0000 g | Freq: Once | INTRAVENOUS | Status: AC
  Administered 2023-08-17: 4 g via INTRAVENOUS
  Filled 2023-08-17: qty 100

## 2023-08-17 NOTE — Progress Notes (Addendum)
NAME:  [name], MRN:  [id number], DOB:  Apr 11, 1966, LOS: 1 ADMISSION DATE:  08/16/2023 CONSULTATION DATE: 08/16/2023 REFERRING MD:  Pilar Plate - EDP CHIEF COMPLAINT: AMS, sepsis   History of Present Illness:  57 year old woman who presented to [facility] 11/29 as a transfer from [facility] for AMS, suspected urosepsis. PMHx significant for HTN, HLD, CVA (moderate-large R MCA infarct), T2DM, epilepsy, depression.   Patient is a resident of the [facility]) and had decreased responsiveness throughout the course of the day. EMS noted GCS 6, fever and tachycardia. Labs were notable for WBC 13.4, Hgb 9.8, Plt 286. INR 1.1. Na 156, K 3.9, CO2 27, Cr 0.99 (baseline 0.8-1), mildly elevated transaminases 78/74. LA 1.5 > 4.0. ABG 7.42/46/100/29.2. COVID/Flu/RSV negative. UA +protein, nitrites, trace leuks. CXR without active disease. 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 Patient was intubated at [facility] for airway protection.  PCCM consulted for ICU admission and transfer.  Pertinent Medical History:   Past Medical History:  Diagnosis Date   Allergic rhinitis    Chickenpox    Depression    Diabetes (HCC)    Epilepsy (HCC)    Headache    HTN (hypertension)    Hyperlipidemia    Seizures (HCC)    Stroke (HCC)    Residual L-sided deficits   Urinary incontinence    Significant Hospital Events: Including procedures, antibiotic start and stop dates in addition to other pertinent events   11/29 - Presented to [facility] for AMS. Not protecting airway and intubated in ED. PCCM consulted for ICU admission and transfer to [facility]. 11/30- off sedation. Low hgb 7   Interim History / Subjective:   Hgb this morning is 7 from 9.8 Got 4L yesterday Off pressors but SBPs low 90s  Na 150 K 3.2 Mag 1.4 Phos 1.6 CBGs high   Objective:  Blood pressure 101/66, pulse 93, temperature 97.8 F (36.6 C), temperature  source Oral, resp. rate 20, height 5\' 2"  (1.575 m), weight 71.1 kg, SpO2 95%.    Vent Mode: PRVC FiO2 (%):  [40 %-50 %] 40 % Set Rate:  [20 bmp] 20 bmp Vt Set:  [400 mL] 400 mL PEEP:  [5 cmH20] 5 cmH20 Plateau Pressure:  [13 cmH20-14 cmH20] 14 cmH20   Intake/Output Summary (Last 24 hours) at 08/17/2023 1037 Last data filed at 08/17/2023 1033 Gross per 24 hour  Intake 5046.06 ml  Output 500 ml  Net 4546.06 ml   Filed Weights   08/16/23 0216 08/17/23 0143  Weight: 68 kg 71.1 kg   Physical Examination:  General: Chronically and critically ill F intubated  Neuro: off sedation. Does not follow commands. Disconjugate gaze   HEENT: ETT secure  CV: s1s2 no rgm  PULM: Symmetrical chest expansion, mechanically ventilated  GI: soft nondistended  GU: foley  Extremities: No acute joint deformity  Skin: c/d/w   Resolved Hospital Problem List:    Assessment & Plan:   Shock, presumed septic  -MRSE bacteremia vs contaminant  -UTI, PNA -has received a lot of volume, at this point doubt hypovolemic. We do have a low hgb however  P -off pressors, goal MAP > 65  -cont broad abx  -follow cx data  -repeat CBC pending & T&S -- have not been able to reach family re blood consent but is repeat confirms low hgb, will discuss w attending re transfusing   Acute hypoxemic respiratory failure P -WUA/SBT -VAP, pulm hygiene -on  abx as above \ -will dc mucomyst  -cont duonebs   Lactic acidosis -likely in setting of shock  -will repeat today since last values were up trending   Epilepsy CVA Moderate-large R MCA infarct, present prior to 03/2014. Follows with [facility]. Multiple admissions for [name]/CVA, last 03/2020. CT Head NAICA, chronic/remote changes present Lives in a facility but her baseline status isn't really clear to me in terms of functional status  P. - Continue home Depakote - No role for repeat brain imaging at present, if mental status does not improve with  sedation cessation would revisit this as well as EEG   AoC anemia -baseline about 8. Down to 7.0 11/30 from 9.8  P -STAT repeat + T&S. Have tried to reach family re blood consent. If repeat is still low, consider emergent transfusion given hypotension if still unable to reach family   Hypernatremia Hypokalemia Hypomagnesemia Hypophosphatemia  P -FWF -mag -Kphos -recheck tomorrow   HTN HLD - holding home antihypertensives w recent pressor req  - Resume statin once LFTs normalized - Confirm if still on Plavix  T2DM w hyperglycemia  - incr SSI  Best Practice: (right click and "Reselect all SmartList Selections" daily)   Diet/type: NPO DVT prophylaxis: SCDs GI prophylaxis: PPI Lines: N/A Foley:  Yes, and it is still needed -- has a coude, unclear if this ws a difficult foley placement? Code Status:  full code Last date of multidisciplinary goals of care discussion [Pending]  Labs:  CBC: Recent Labs  Lab 08/16/23 0205 08/17/23 0513 08/17/23 0819  WBC 13.4* 16.4* 13.3*  NEUTROABS 8.8*  --   --   HGB 9.8* 7.0* 6.8*  HCT 31.1* 22.7* 22.3*  MCV 73.0* 72.5* 72.2*  PLT 286 184 182   Basic Metabolic Panel: Recent Labs  Lab 08/16/23 0205 08/16/23 0656 08/16/23 1657 08/17/23 0513  NA 156* 156*  --  150*  K 3.9 3.9  --  3.2*  CL 118* 117*  --  116*  CO2 27 25  --  26  GLUCOSE 222* 205*  --  198*  BUN 45* 40*  --  24*  CREATININE 0.99 0.87  --  0.67  CALCIUM 9.4 8.9  --  8.2*  MG  --  1.8 1.6* 1.4*  PHOS  --  3.2 2.4* 1.6*   GFR: Estimated Creatinine Clearance: 71.7 mL/min (by C-G formula based on SCr of 0.67 mg/dL). Recent Labs  Lab 08/16/23 0205 08/16/23 0340 08/16/23 0656 08/16/23 0947 08/17/23 0513 08/17/23 0819  PROCALCITON  --   --  0.17  --   --   --   WBC 13.4*  --   --   --  16.4* 13.3*  LATICACIDVEN 1.5 4.0* 5.3* 5.0*  --   --    Liver Function Tests: Recent Labs  Lab 08/16/23 0205 08/16/23 0656 08/17/23 0513  AST 78* 57* 24  ALT 74*  60* 35  ALKPHOS 40 40 34*  BILITOT 0.8 0.6 0.3  PROT 6.6 5.7* 4.7*  ALBUMIN 2.9* 2.5* 2.0*   No results for input(s): "LIPASE", "AMYLASE" in the last 168 hours. No results for input(s): "AMMONIA" in the last 168 hours.  ABG:    Component Value Date/Time   PHART 7.42 08/16/2023 0348   PCO2ART 46 08/16/2023 0348   PO2ART 100 08/16/2023 0348   HCO3 29.2 (H) 08/16/2023 0348   O2SAT 98.5 08/16/2023 0348    Coagulation Profile: Recent Labs  Lab 08/16/23 0205  INR 1.1  Cardiac Enzymes: No results for input(s): "CKTOTAL", "CKMB", "CKMBINDEX", "TROPONINI" in the last 168 hours.  HbA1C: Hemoglobin A1C  Date/Time Value Ref Range Status  08/21/2014 02:40 PM 11.1 (H) 4.2 - 6.3 % Final    Comment:    The American Diabetes Association recommends that a primary goal of therapy should be <7% and that physicians should reevaluate the treatment regimen in patients with HbA1c values consistently >8%.   02/11/2013 08:04 PM 13.3 (H) 4.2 - 6.3 % Final    Comment:    The American Diabetes Association recommends that a primary goal of therapy should be <7% and that physicians should reevaluate the treatment regimen in patients with HbA1c values consistently >8%.    Hgb A1c MFr Bld  Date/Time Value Ref Range Status  08/16/2023 09:47 AM 5.0 4.8 - 5.6 % Final    Comment:    (NOTE) Pre diabetes:          5.7%-6.4%  Diabetes:              >6.4%  Glycemic control for   <7.0% adults with diabetes   02/24/2020 07:12 PM 7.7 (H) 4.8 - 5.6 % Final    Comment:    (NOTE) Pre diabetes:          5.7%-6.4%  Diabetes:              >6.4%  Glycemic control for   <7.0% adults with diabetes    CBG: Recent Labs  Lab 08/16/23 1510 08/16/23 1907 08/16/23 2316 08/17/23 0301 08/17/23 0718  GLUCAP 180* 200* 171* 183* 190*   CRITICAL CARE Performed by: Lanier Clam   Total critical care time: 38 minutes  Critical care time was exclusive of separately billable procedures and treating  other patients. Critical care was necessary to treat or prevent imminent or life-threatening deterioration.  Critical care was time spent personally by me on the following activities: development of treatment plan with patient and/or surrogate as well as nursing, discussions with consultants, evaluation of patient's response to treatment, examination of patient, obtaining history from patient or surrogate, ordering and performing treatments and interventions, ordering and review of laboratory studies, ordering and review of radiographic studies, pulse oximetry and re-evaluation of patient's condition.  Tessie Fass MSN, AGACNP-BC [facility] Pulmonary/Critical Care Medicine Amion for pager  08/17/2023, 10:37 AM

## 2023-08-17 NOTE — Progress Notes (Signed)
PHARMACY - PHYSICIAN COMMUNICATION CRITICAL VALUE ALERT - BLOOD CULTURE IDENTIFICATION (BCID)  [name] is an 57 y.o. female who presented to [facility] on 08/16/2023 with a chief complaint of altered mental status/?urosepsis  Name of physician (or Provider) Contacted: Dr. Warrick Parisian  Current antibiotics: Vancomycin, Ceftriaxone, Azithromycin  Changes to prescribed antibiotics recommended:  No changes for now  Results for orders placed or performed during the hospital encounter of 08/16/23  Blood Culture ID Panel (Reflexed) (Collected: 08/16/2023  2:05 AM)  Result Value Ref Range   Enterococcus faecalis NOT DETECTED NOT DETECTED   Enterococcus Faecium NOT DETECTED NOT DETECTED   Listeria monocytogenes NOT DETECTED NOT DETECTED   Staphylococcus species DETECTED (A) NOT DETECTED   Staphylococcus aureus (BCID) NOT DETECTED NOT DETECTED   Staphylococcus epidermidis DETECTED (A) NOT DETECTED   Staphylococcus lugdunensis NOT DETECTED NOT DETECTED   Streptococcus species NOT DETECTED NOT DETECTED   Streptococcus agalactiae NOT DETECTED NOT DETECTED   Streptococcus pneumoniae NOT DETECTED NOT DETECTED   Streptococcus pyogenes NOT DETECTED NOT DETECTED   A.calcoaceticus-baumannii NOT DETECTED NOT DETECTED   Bacteroides fragilis NOT DETECTED NOT DETECTED   Enterobacterales NOT DETECTED NOT DETECTED   Enterobacter cloacae complex NOT DETECTED NOT DETECTED   Escherichia coli NOT DETECTED NOT DETECTED   Klebsiella aerogenes NOT DETECTED NOT DETECTED   Klebsiella oxytoca NOT DETECTED NOT DETECTED   Klebsiella pneumoniae NOT DETECTED NOT DETECTED   Proteus species NOT DETECTED NOT DETECTED   Salmonella species NOT DETECTED NOT DETECTED   Serratia marcescens NOT DETECTED NOT DETECTED   Haemophilus influenzae NOT DETECTED NOT DETECTED   Neisseria meningitidis NOT DETECTED NOT DETECTED   Pseudomonas aeruginosa NOT DETECTED NOT DETECTED   Stenotrophomonas maltophilia NOT DETECTED NOT DETECTED    Candida albicans NOT DETECTED NOT DETECTED   Candida auris NOT DETECTED NOT DETECTED   Candida glabrata NOT DETECTED NOT DETECTED   Candida krusei NOT DETECTED NOT DETECTED   Candida parapsilosis NOT DETECTED NOT DETECTED   Candida tropicalis NOT DETECTED NOT DETECTED   Cryptococcus neoformans/gattii NOT DETECTED NOT DETECTED   Methicillin resistance mecA/C DETECTED (A) NOT DETECTED    Abran Duke 08/17/2023  12:53 AM

## 2023-08-17 NOTE — Progress Notes (Signed)
Patient transported to 2M08 from 29M without complications. RN at bedside.

## 2023-08-17 NOTE — Plan of Care (Signed)
  Problem: Clinical Measurements: Goal: Ability to maintain clinical measurements within normal limits will improve Outcome: Progressing Goal: Will remain free from infection Outcome: Progressing Goal: Diagnostic test results will improve Outcome: Progressing Goal: Respiratory complications will improve Outcome: Progressing Goal: Cardiovascular complication will be avoided Outcome: Progressing   Problem: Nutrition: Goal: Adequate nutrition will be maintained Outcome: Progressing   Problem: Elimination: Goal: Will not experience complications related to bowel motility Outcome: Progressing Goal: Will not experience complications related to urinary retention Outcome: Progressing   Problem: Pain Management: Goal: General experience of comfort will improve Outcome: Progressing

## 2023-08-17 NOTE — Plan of Care (Signed)
Still unable to reach spouse. Able to reach son Rasean, who consents for blood administration.   1 PRBC, H/H post transfusion    Tessie Fass MSN, AGACNP-BC [facility] Pulmonary/Critical Care Medicine 08/17/2023, 11:06 AM

## 2023-08-17 NOTE — Plan of Care (Signed)
  Problem: Clinical Measurements: Goal: Will remain free from infection Outcome: Progressing   Problem: Clinical Measurements: Goal: Diagnostic test results will improve Outcome: Progressing   Problem: Clinical Measurements: Goal: Respiratory complications will improve Outcome: Progressing   Problem: Clinical Measurements: Goal: Cardiovascular complication will be avoided Outcome: Progressing   Problem: Activity: Goal: Risk for activity intolerance will decrease Outcome: Progressing   

## 2023-08-17 NOTE — Progress Notes (Signed)
[facility] ADULT ICU REPLACEMENT PROTOCOL   The patient does apply for the [facility] Adult ICU Electrolyte Replacment Protocol based on the criteria listed below:   1.Exclusion criteria: TCTS, ECMO, Dialysis, and Myasthenia Gravis patients 2. Is GFR >/= 30 ml/min? Yes.    Patient's GFR today is >60 3. Is SCr </= 2? Yes.   Patient's SCr is 0.67  mg/dL 4. Did SCr increase >/= 0.5 in 24 hours? No. 5.Pt's weight >40kg  Yes.   6. Abnormal electrolyte(s): K+ = 3.2, Phos = 1.6,  Mg = 1.4  7. Electrolytes replaced per protocol 8.  Call MD STAT for K+ </= 2.5, Phos </= 1, or Mag </= 1 Physician:  Ogan. eMD  Suzan Slick Mayley Lish 08/17/2023 6:01 AM

## 2023-08-18 DIAGNOSIS — R6521 Severe sepsis with septic shock: Secondary | ICD-10-CM | POA: Diagnosis not present

## 2023-08-18 DIAGNOSIS — D649 Anemia, unspecified: Secondary | ICD-10-CM | POA: Diagnosis not present

## 2023-08-18 DIAGNOSIS — A419 Sepsis, unspecified organism: Secondary | ICD-10-CM | POA: Diagnosis not present

## 2023-08-18 DIAGNOSIS — J9601 Acute respiratory failure with hypoxia: Secondary | ICD-10-CM | POA: Diagnosis not present

## 2023-08-18 LAB — CBC
HCT: 22.6 % — ABNORMAL LOW (ref 36.0–46.0)
Hemoglobin: 7.4 g/dL — ABNORMAL LOW (ref 12.0–15.0)
MCH: 24 pg — ABNORMAL LOW (ref 26.0–34.0)
MCHC: 32.7 g/dL (ref 30.0–36.0)
MCV: 73.4 fL — ABNORMAL LOW (ref 80.0–100.0)
Platelets: 169 10*3/uL (ref 150–400)
RBC: 3.08 MIL/uL — ABNORMAL LOW (ref 3.87–5.11)
RDW: 19 % — ABNORMAL HIGH (ref 11.5–15.5)
WBC: 11.5 10*3/uL — ABNORMAL HIGH (ref 4.0–10.5)
nRBC: 0 % (ref 0.0–0.2)

## 2023-08-18 LAB — TYPE AND SCREEN
ABO/RH(D): O NEG
Antibody Screen: NEGATIVE
Unit division: 0

## 2023-08-18 LAB — BASIC METABOLIC PANEL
Anion gap: 8 (ref 5–15)
BUN: 17 mg/dL (ref 6–20)
CO2: 27 mmol/L (ref 22–32)
Calcium: 7.9 mg/dL — ABNORMAL LOW (ref 8.9–10.3)
Chloride: 111 mmol/L (ref 98–111)
Creatinine, Ser: 0.51 mg/dL (ref 0.44–1.00)
GFR, Estimated: >60 mL/min (ref >60)
Glucose, Bld: 157 mg/dL — ABNORMAL HIGH (ref 70–99)
Potassium: 3.3 mmol/L — ABNORMAL LOW (ref 3.5–5.1)
Sodium: 146 mmol/L — ABNORMAL HIGH (ref 135–145)

## 2023-08-18 LAB — GLUCOSE, CAPILLARY
Glucose-Capillary: 193 mg/dL — ABNORMAL HIGH (ref 70–99)
Glucose-Capillary: 150 mg/dL — ABNORMAL HIGH (ref 70–99)
Glucose-Capillary: 158 mg/dL — ABNORMAL HIGH (ref 70–99)
Glucose-Capillary: 158 mg/dL — ABNORMAL HIGH (ref 70–99)
Glucose-Capillary: 146 mg/dL — ABNORMAL HIGH (ref 70–99)
Glucose-Capillary: 98 mg/dL (ref 70–99)
Glucose-Capillary: 106 mg/dL — ABNORMAL HIGH (ref 70–99)

## 2023-08-18 LAB — MAGNESIUM
Magnesium: 2.2 mg/dL (ref 1.7–2.4)
Magnesium: 2.1 mg/dL (ref 1.7–2.4)

## 2023-08-18 LAB — BPAM RBC
Blood Product Expiration Date: 202412162359
ISSUE DATE / TIME: 202411301146
Unit Type and Rh: 9500

## 2023-08-18 LAB — PHOSPHORUS
Phosphorus: 3.7 mg/dL (ref 2.5–4.6)
Phosphorus: 2.8 mg/dL (ref 2.5–4.6)

## 2023-08-18 MED ORDER — ORAL CARE MOUTH RINSE
15.0000 mL | OROMUCOSAL | Status: DC | PRN
  Administered 2023-08-24: 15 mL via OROMUCOSAL

## 2023-08-18 MED ORDER — POTASSIUM CHLORIDE 10 MEQ/100ML IV SOLN
10.0000 meq | INTRAVENOUS | Status: AC
  Administered 2023-08-18 (×4): 10 meq via INTRAVENOUS
  Filled 2023-08-18 (×4): qty 100

## 2023-08-18 MED ORDER — POTASSIUM CHLORIDE 20 MEQ PO PACK
20.0000 meq | PACK | ORAL | Status: AC
  Administered 2023-08-18 (×2): 20 meq
  Filled 2023-08-18 (×2): qty 1

## 2023-08-18 NOTE — Care Plan (Signed)
While intubated and now after extubation Pt having prolonged and weak coughing spells. Cough is weak and no productive but suction of back of mouth and tongue (pharynx) has copious clear/white secretions.

## 2023-08-18 NOTE — Progress Notes (Signed)
NAME:  [name], MRN:  [id number], DOB:  [birth year], LOS: 2 ADMISSION DATE:  08/16/2023 CONSULTATION DATE: 08/16/2023 REFERRING MD:  Pilar Plate - EDP CHIEF COMPLAINT: AMS, sepsis   History of Present Illness:  57 year old woman who presented to [facility] 11/29 as a transfer from [facility] for AMS, suspected urosepsis. PMHx significant for HTN, HLD, CVA (moderate-large R MCA infarct), T2DM, epilepsy, depression.   Patient is a resident of the [facility]) and had decreased responsiveness throughout the course of the day. EMS noted GCS 6, fever and tachycardia. Labs were notable for WBC 13.4, Hgb 9.8, Plt 286. INR 1.1. Na 156, K 3.9, CO2 27, Cr 0.99 (baseline 0.8-1), mildly elevated transaminases 78/74. LA 1.5 > 4.0. ABG 7.42/46/100/29.2. COVID/Flu/RSV negative. UA +protein, nitrites, trace leuks. CXR without active disease. 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 Patient was intubated at [facility] for airway protection.  PCCM consulted for ICU admission and transfer.  Pertinent Medical History:   Past Medical History:  Diagnosis Date   Allergic rhinitis    Chickenpox    Depression    Diabetes (HCC)    Epilepsy (HCC)    Headache    HTN (hypertension)    Hyperlipidemia    Seizures (HCC)    Stroke (HCC)    Residual L-sided deficits   Urinary incontinence    Significant Hospital Events: Including procedures, antibiotic start and stop dates in addition to other pertinent events   11/29 - Presented to [facility] for AMS. Not protecting airway and intubated in ED. PCCM consulted for ICU admission and transfer to [facility]. 11/30- off sedation. Low hgb 7   Interim History / Subjective:   No acute events overnight Still lethargic per the husbands report, but following commands Placed on PSV trial this AM  Objective:  Blood pressure 119/87, pulse (!) 106, temperature 99.2 F (37.3 C),  temperature source Oral, resp. rate 19, height 5\' 2"  (1.575 m), weight 73.7 kg, SpO2 100%.    Vent Mode: PSV;CPAP FiO2 (%):  [40 %] 40 % Set Rate:  [15 bmp] 15 bmp Vt Set:  [400 mL] 400 mL PEEP:  [5 cmH20] 5 cmH20 Plateau Pressure:  [11 cmH20-14 cmH20] 11 cmH20   Intake/Output Summary (Last 24 hours) at 08/18/2023 0948 Last data filed at 08/18/2023 0800 Gross per 24 hour  Intake 3270.07 ml  Output 600 ml  Net 2670.07 ml   Filed Weights   08/16/23 0216 08/17/23 0143 08/18/23 0500  Weight: 68 kg 71.1 kg 73.7 kg   Physical Examination:  General: Chronically and critically ill woman, intubated Neuro: follows commands intermittently, Disconjugate gaze   HEENT: ETT in place CV: s1s2 no rgm  PULM: clear to auscultation, no wheezing GI: soft nondistended  GU: foley  Extremities: No acute joint deformity  Skin: c/d/w   Resolved Hospital Problem List:  Lactic acidosis  Assessment & Plan:   Shock, presumed septic  -MRSE bacteremia vs contaminant  -UTI, PNA P -off pressors, goal MAP > 65  -cont vancomycin, ceftriaxone and azithromycin -follow cx data, staph aureus on trach aspirate  Acute hypoxemic respiratory failure P -WUA/SBT -VAP, pulm hygiene -on abx as above  -cont duonebs   Epilepsy CVA Moderate-large R MCA infarct, present prior to 03/2014. Follows with [facility]. Multiple admissions for [name]/CVA, last 03/2020. CT Head NAICA, chronic/remote changes present Lives in a facility but her baseline status isn't really clear to me in terms of functional  status  P. - Continue home Depakote - Mental status slowly improving, no need for imaging at this time  AoC anemia -baseline about 8. Down to 7.0 11/30 from 9.8  P -Transfused 1 unit PRBCs yesterday - transfuse for hemoglobin < 7g/dL  Hypernatremia Hypokalemia Hypomagnesemia Hypophosphatemia  P -FWF -mag -Kphos -daily monitoring  HTN HLD - holding home antihypertensives w recent pressor req  -  Resume statin once LFTs normalized - Confirm if still on Plavix  T2DM w hyperglycemia  - incr SSI  Best Practice: (right click and "Reselect all SmartList Selections" daily)   Diet/type: tubefeeds DVT prophylaxis: SCDs GI prophylaxis: PPI Lines: N/A Foley:  Yes, and it is still needed -- has a coude, unclear if this ws a difficult foley placement? Code Status:  full code Last date of multidisciplinary goals of care discussion [Updated husband at bedside]  Labs:  CBC: Recent Labs  Lab 08/16/23 0205 08/17/23 0513 08/17/23 0819 08/17/23 1531 08/18/23 0221  WBC 13.4* 16.4* 13.3*  --  11.5*  NEUTROABS 8.8*  --   --   --   --   HGB 9.8* 7.0* 6.8* 8.3* 7.4*  HCT 31.1* 22.7* 22.3* 27.6* 22.6*  MCV 73.0* 72.5* 72.2*  --  73.4*  PLT 286 184 182  --  169   Basic Metabolic Panel: Recent Labs  Lab 08/16/23 0205 08/16/23 0656 08/16/23 0656 08/16/23 1657 08/17/23 0513 08/17/23 1712 08/17/23 2008 08/18/23 0221  NA 156* 156*  --   --  150*  --   --  146*  K 3.9 3.9  --   --  3.2*  --   --  3.3*  CL 118* 117*  --   --  116*  --   --  111  CO2 27 25  --   --  26  --   --  27  GLUCOSE 222* 205*  --   --  198*  --   --  157*  BUN 45* 40*  --   --  24*  --   --  17  CREATININE 0.99 0.87  --   --  0.67  --   --  0.51  CALCIUM 9.4 8.9  --   --  8.2*  --   --  7.9*  MG  --  1.8  --  1.6* 1.4* 2.3  --  2.2  PHOS  --  3.2   < > 2.4* 1.6* 4.5 4.4 3.7   < > = values in this interval not displayed.   GFR: Estimated Creatinine Clearance: 72 mL/min (by C-G formula based on SCr of 0.51 mg/dL). Recent Labs  Lab 08/16/23 0205 08/16/23 0340 08/16/23 0656 08/16/23 0947 08/17/23 0513 08/17/23 0819 08/17/23 1119 08/18/23 0221  PROCALCITON  --   --  0.17  --   --   --   --   --   WBC 13.4*  --   --   --  16.4* 13.3*  --  11.5*  LATICACIDVEN 1.5 4.0* 5.3* 5.0*  --   --  3.5*  --    Liver Function Tests: Recent Labs  Lab 08/16/23 0205 08/16/23 0656 08/17/23 0513  AST 78* 57* 24   ALT 74* 60* 35  ALKPHOS 40 40 34*  BILITOT 0.8 0.6 0.3  PROT 6.6 5.7* 4.7*  ALBUMIN 2.9* 2.5* 2.0*   No results for input(s): "LIPASE", "AMYLASE" in the last 168 hours. No results for input(s): "AMMONIA" in the last 168 hours.  ABG:  Component Value Date/Time   PHART 7.42 08/16/2023 0348   PCO2ART 46 08/16/2023 0348   PO2ART 100 08/16/2023 0348   HCO3 29.2 (H) 08/16/2023 0348   O2SAT 98.5 08/16/2023 0348    Coagulation Profile: Recent Labs  Lab 08/16/23 0205  INR 1.1   Cardiac Enzymes: No results for input(s): "CKTOTAL", "CKMB", "CKMBINDEX", "TROPONINI" in the last 168 hours.  HbA1C: Hemoglobin A1C  Date/Time Value Ref Range Status  08/21/2014 02:40 PM 11.1 (H) 4.2 - 6.3 % Final    Comment:    The American Diabetes Association recommends that a primary goal of therapy should be <7% and that physicians should reevaluate the treatment regimen in patients with HbA1c values consistently >8%.   02/11/2013 08:04 PM 13.3 (H) 4.2 - 6.3 % Final    Comment:    The American Diabetes Association recommends that a primary goal of therapy should be <7% and that physicians should reevaluate the treatment regimen in patients with HbA1c values consistently >8%.    Hgb A1c MFr Bld  Date/Time Value Ref Range Status  08/16/2023 09:47 AM 5.0 4.8 - 5.6 % Final    Comment:    (NOTE) Pre diabetes:          5.7%-6.4%  Diabetes:              >6.4%  Glycemic control for   <7.0% adults with diabetes   02/24/2020 07:12 PM 7.7 (H) 4.8 - 5.6 % Final    Comment:    (NOTE) Pre diabetes:          5.7%-6.4%  Diabetes:              >6.4%  Glycemic control for   <7.0% adults with diabetes    CBG: Recent Labs  Lab 08/17/23 1510 08/17/23 1916 08/17/23 2310 08/18/23 0325 08/18/23 0719  GLUCAP 188* 180* 141* 150* 158*   CRITICAL CARE Performed by: Martina Sinner   Total critical care time: 35 minutes  Critical care time was exclusive of separately billable procedures  and treating other patients. Critical care was necessary to treat or prevent imminent or life-threatening deterioration.  Critical care was time spent personally by me on the following activities: development of treatment plan with patient and/or surrogate as well as nursing, discussions with consultants, evaluation of patient's response to treatment, examination of patient, obtaining history from patient or surrogate, ordering and performing treatments and interventions, ordering and review of laboratory studies, ordering and review of radiographic studies, pulse oximetry and re-evaluation of patient's condition.  Melody Comas, MD [facility] Office: [phone]   See Amion for personal pager PCCM on call pager [phone] until 7pm. Please call Elink 7p-7a. [phone]

## 2023-08-18 NOTE — Plan of Care (Signed)

## 2023-08-18 NOTE — Progress Notes (Signed)
[facility] ADULT ICU REPLACEMENT PROTOCOL   The patient does apply for the [facility] Adult ICU Electrolyte Replacment Protocol based on the criteria listed below:   1.Exclusion criteria: TCTS, ECMO, Dialysis, and Myasthenia Gravis patients 2. Is GFR >/= 30 ml/min? Yes.    Patient's GFR today is >60 3. Is SCr </= 2? Yes.   Patient's SCr is 0.51 mg/dL 4. Did SCr increase >/= 0.5 in 24 hours? No. 5.Pt's weight >40kg  Yes.   6. Abnormal electrolyte(s): K  7. Electrolytes replaced per protocol 8.  Call MD STAT for K+ </= 2.5, Phos </= 1, or Mag </= 1 Physician:  Drucie Opitz Sonji Starkes 08/18/2023 3:35 AM

## 2023-08-18 NOTE — Procedures (Signed)
Extubation Procedure Note  Patient Details:   Name: [name] DOB: 06/13/1966 MRN: [id number]   Airway Documentation:  Airway 7.5 mm (Active)  Secured at (cm) 22 cm 08/18/23 1109  Measured From Lips 08/18/23 1109  Secured Location Left 08/18/23 1109  Secured By [employer] 08/18/23 1109  Bite Block No 08/18/23 0342  Tube Holder Repositioned Yes 08/18/23 0724  Prone position No 08/18/23 0342  Cuff Pressure (cm H2O) Green OR 18-26 [facility] 08/18/23 0724  Site Condition Cool;Dry 08/18/23 1109   Vent end date: 08/18/23 Vent end time: 1426   Evaluation  O2 sats: stable throughout Complications: No apparent complications Patient did tolerate procedure well. Bilateral Breath Sounds: Diminished   No, pt could not speak post extubation.  Pt extubated successfully to 2 l/m [place].  [name] 08/18/2023, 2:27 PM

## 2023-08-19 ENCOUNTER — Inpatient Hospital Stay (HOSPITAL_COMMUNITY): Payer: BC Managed Care – PPO

## 2023-08-19 DIAGNOSIS — J15211 Pneumonia due to Methicillin susceptible Staphylococcus aureus: Secondary | ICD-10-CM

## 2023-08-19 DIAGNOSIS — R569 Unspecified convulsions: Secondary | ICD-10-CM

## 2023-08-19 DIAGNOSIS — R4182 Altered mental status, unspecified: Secondary | ICD-10-CM

## 2023-08-19 DIAGNOSIS — J9601 Acute respiratory failure with hypoxia: Secondary | ICD-10-CM | POA: Diagnosis not present

## 2023-08-19 DIAGNOSIS — E440 Moderate protein-calorie malnutrition: Secondary | ICD-10-CM | POA: Insufficient documentation

## 2023-08-19 LAB — BASIC METABOLIC PANEL
Anion gap: 10 (ref 5–15)
BUN: 13 mg/dL (ref 6–20)
CO2: 24 mmol/L (ref 22–32)
Calcium: 8.3 mg/dL — ABNORMAL LOW (ref 8.9–10.3)
Chloride: 114 mmol/L — ABNORMAL HIGH (ref 98–111)
Creatinine, Ser: 0.4 mg/dL — ABNORMAL LOW (ref 0.44–1.00)
GFR, Estimated: >60 mL/min (ref >60)
Glucose, Bld: 101 mg/dL — ABNORMAL HIGH (ref 70–99)
Potassium: 4.6 mmol/L (ref 3.5–5.1)
Sodium: 148 mmol/L — ABNORMAL HIGH (ref 135–145)

## 2023-08-19 LAB — CULTURE, RESPIRATORY W GRAM STAIN

## 2023-08-19 LAB — GLUCOSE, CAPILLARY
Glucose-Capillary: 106 mg/dL — ABNORMAL HIGH (ref 70–99)
Glucose-Capillary: 127 mg/dL — ABNORMAL HIGH (ref 70–99)
Glucose-Capillary: 128 mg/dL — ABNORMAL HIGH (ref 70–99)
Glucose-Capillary: 173 mg/dL — ABNORMAL HIGH (ref 70–99)
Glucose-Capillary: 93 mg/dL (ref 70–99)
Glucose-Capillary: 99 mg/dL (ref 70–99)

## 2023-08-19 LAB — LEGIONELLA PNEUMOPHILA SEROGP 1 UR AG: L. pneumophila Serogp 1 Ur Ag: NEGATIVE

## 2023-08-19 LAB — PHOSPHORUS: Phosphorus: 2.4 mg/dL — ABNORMAL LOW (ref 2.5–4.6)

## 2023-08-19 LAB — CULTURE, BLOOD (ROUTINE X 2): Special Requests: ADEQUATE

## 2023-08-19 LAB — CBC
HCT: 25.2 % — ABNORMAL LOW (ref 36.0–46.0)
Hemoglobin: 8.3 g/dL — ABNORMAL LOW (ref 12.0–15.0)
MCH: 23.3 pg — ABNORMAL LOW (ref 26.0–34.0)
MCHC: 32.9 g/dL (ref 30.0–36.0)
MCV: 70.8 fL — ABNORMAL LOW (ref 80.0–100.0)
Platelets: 183 10*3/uL (ref 150–400)
RBC: 3.56 MIL/uL — ABNORMAL LOW (ref 3.87–5.11)
RDW: 19.9 % — ABNORMAL HIGH (ref 11.5–15.5)
WBC: 11.1 10*3/uL — ABNORMAL HIGH (ref 4.0–10.5)
nRBC: 0 % (ref 0.0–0.2)

## 2023-08-19 LAB — MAGNESIUM: Magnesium: 2 mg/dL (ref 1.7–2.4)

## 2023-08-19 MED ORDER — VALPROIC ACID 250 MG/5ML PO SOLN
500.0000 mg | Freq: Every day | ORAL | Status: DC
  Administered 2023-08-19 – 2023-08-23 (×5): 500 mg
  Filled 2023-08-19 (×6): qty 10

## 2023-08-19 MED ORDER — VALPROIC ACID 250 MG/5ML PO SOLN
250.0000 mg | Freq: Every morning | ORAL | Status: DC
  Administered 2023-08-20 – 2023-08-23 (×4): 250 mg
  Filled 2023-08-19 (×5): qty 5

## 2023-08-19 MED ORDER — SERTRALINE HCL 25 MG PO TABS
50.0000 mg | ORAL_TABLET | Freq: Every day | ORAL | Status: DC
  Administered 2023-08-19 – 2023-08-23 (×5): 50 mg
  Filled 2023-08-19 (×2): qty 2
  Filled 2023-08-19: qty 1
  Filled 2023-08-19 (×4): qty 2

## 2023-08-19 MED ORDER — CEFAZOLIN SODIUM-DEXTROSE 2-4 GM/100ML-% IV SOLN
2.0000 g | Freq: Three times a day (TID) | INTRAVENOUS | Status: AC
  Administered 2023-08-19 – 2023-08-24 (×15): 2 g via INTRAVENOUS
  Filled 2023-08-19 (×16): qty 100

## 2023-08-19 MED ORDER — FREE WATER
150.0000 mL | Status: DC
  Administered 2023-08-19 – 2023-08-22 (×18): 150 mL

## 2023-08-19 MED ORDER — CLOPIDOGREL BISULFATE 75 MG PO TABS
75.0000 mg | ORAL_TABLET | Freq: Every day | ORAL | Status: DC
  Administered 2023-08-19 – 2023-08-23 (×5): 75 mg
  Filled 2023-08-19 (×7): qty 1

## 2023-08-19 MED ORDER — VITAL AF 1.2 CAL PO LIQD
1000.0000 mL | ORAL | Status: DC
  Administered 2023-08-19 – 2023-08-22 (×3): 1000 mL
  Filled 2023-08-19 (×5): qty 1000

## 2023-08-19 MED ORDER — ATORVASTATIN CALCIUM 40 MG PO TABS
40.0000 mg | ORAL_TABLET | Freq: Every day | ORAL | Status: DC
  Administered 2023-08-19 – 2023-08-23 (×5): 40 mg
  Filled 2023-08-19 (×7): qty 1

## 2023-08-19 MED ORDER — SODIUM PHOSPHATES 45 MMOLE/15ML IV SOLN
15.0000 mmol | Freq: Once | INTRAVENOUS | Status: AC
  Administered 2023-08-19: 15 mmol via INTRAVENOUS
  Filled 2023-08-19: qty 5

## 2023-08-19 MED ORDER — ASPIRIN 81 MG PO CHEW
81.0000 mg | CHEWABLE_TABLET | Freq: Every day | ORAL | Status: DC
  Administered 2023-08-19 – 2023-08-23 (×5): 81 mg
  Filled 2023-08-19 (×7): qty 1

## 2023-08-19 NOTE — Progress Notes (Signed)
NAME:  [name], MRN:  [id number], DOB:  December 14, 1965, LOS: 3 ADMISSION DATE:  08/16/2023 CONSULTATION DATE: 08/16/2023 REFERRING MD:  Pilar Plate - EDP CHIEF COMPLAINT: AMS, sepsis   History of Present Illness:  57 year old woman who presented to [facility] 11/29 as a transfer from [facility] for AMS, suspected urosepsis. PMHx significant for HTN, HLD, CVA (moderate-large R MCA infarct), T2DM, epilepsy, depression.   Patient is a resident of the [facility] [place]) and had decreased responsiveness throughout the course of the day. EMS noted GCS 6, fever and tachycardia. Labs were notable for WBC 13.4, Hgb 9.8, Plt 286. INR 1.1. Na 156, K 3.9, CO2 27, Cr 0.99 (baseline 0.8-1), mildly elevated transaminases 78/74. LA 1.5 > 4.0. ABG 7.42/46/100/29.2. COVID/Flu/RSV negative. UA +protein, nitrites, trace leuks. CXR without active disease. 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 Patient was intubated at [facility] for airway protection.  PCCM consulted for ICU admission and transfer.  Pertinent Medical History:   Past Medical History:  Diagnosis Date   Allergic rhinitis    Chickenpox    Depression    Diabetes (HCC)    Epilepsy (HCC)    Headache    HTN (hypertension)    Hyperlipidemia    Seizures (HCC)    Stroke (HCC)    Residual L-sided deficits   Urinary incontinence    Significant Hospital Events: Including procedures, antibiotic start and stop dates in addition to other pertinent events   11/29 - Presented to [facility] for AMS. Not protecting airway and intubated in ED. PCCM consulted for ICU admission and transfer to [facility]. 11/30- off sedation. 1 U pRBCs transfused 12/1 - extubated  Interim History / Subjective:   No acute events overnight   Objective:  Blood pressure 130/83, pulse (!) 104, temperature 98.6 F (37 C), temperature source Axillary, resp. rate (!) 40, height 5\' 2"  (1.575 m),  weight 73.8 kg, SpO2 100%.    Vent Mode: PSV;CPAP FiO2 (%):  [24 %-40 %] 24 % Pressure Support:  [5 cmH20] 5 cmH20   Intake/Output Summary (Last 24 hours) at 08/19/2023 8657 Last data filed at 08/19/2023 0800 Gross per 24 hour  Intake 826.47 ml  Output 470 ml  Net 356.47 ml   Filed Weights   08/17/23 0143 08/18/23 0500 08/19/23 0500  Weight: 71.1 kg 73.7 kg 73.8 kg   Physical Examination:  General: 57 y.o. woman, chronically ill, lying in bed, NAD Neuro: abel to follow some commands (grip, stick out tongue, squeeze hand), EOEMI, fair grip strength on R, no grip strength on L HEENT: dry MM, white sclera, clear conjunctiva  CV: tachycardic, normal S1S2, no murmur PULM: CTAB anteriorly, normal WOB on RA GI: soft, nondistended, non tender GU: no foley Extremities: No edema BLEs Skin: warm and dry    Resolved Hospital Problem List:  Lactic acidosis  Assessment & Plan:   Shock, presumed septic  - PNA, MSSA on trach aspirate - MR mecA/C on blood cx likely contaminate - Urine cx negative - off pressors, goal MAP > 65  - d/c vanc and CTX, 3 day course azithromycin complete - Start Ancef today for total 7 day course to tx MSSA PNA  Acute hypoxemic respiratory failure - Resolved, extubated yesterday  - tachypneic and tachycardic this morning, CTM  -on abx as above  -cont scheduled duonebs   Epilepsy CVA Moderate-large R MCA infarct, present prior to 03/2014. Follows with [facility]. Multiple admissions for  [name]/CVA, last 03/2020. CT Head NAICA, chronic/remote changes present Lives in a facility, do not know baseline mental status.  Question if seizure could have resulted in aspiration PNA - Continue home Depakote - Mental status slowly improving, no need for imaging at this time - EEG today - restart home ASA, plavix and statin   AoC anemia -baseline about 8.  - s/p 1 unit pRBCs 11/30 - CBC this morning not yet drawn - am CBC ordered - transfuse for hemoglobin  < 7g/dL - restart home oral iron when able (cannot be given with cortrak)  Electrolyte derangements Mild hypernatremia and hyperphosphatemia.  Mag and K WNL today.  Feeds via cortrack starting today -daily monitoring  HTN HLD - holding home antihypertensives w recent pressor req  - Restart home statin today  T2DM w hyperglycemia  - holding home metformin - incr SSI  Depression - restart home zoloft    Best Practice: (right click and "Reselect all SmartList Selections" daily)   Diet/type: tubefeeds via cortrak DVT prophylaxis: SCDs GI prophylaxis: PPI Lines: N/A Foley:  No, external catheter in place Code Status:  full code Last date of multidisciplinary goals of care discussion [Updated husband at bedside]  Labs:  CBC: Recent Labs  Lab 08/16/23 0205 08/17/23 0513 08/17/23 0819 08/17/23 1531 08/18/23 0221  WBC 13.4* 16.4* 13.3*  --  11.5*  NEUTROABS 8.8*  --   --   --   --   HGB 9.8* 7.0* 6.8* 8.3* 7.4*  HCT 31.1* 22.7* 22.3* 27.6* 22.6*  MCV 73.0* 72.5* 72.2*  --  73.4*  PLT 286 184 182  --  169   Basic Metabolic Panel: Recent Labs  Lab 08/16/23 0205 08/16/23 0656 08/16/23 1657 08/17/23 0513 08/17/23 1712 08/17/23 2008 08/18/23 0221 08/18/23 1807 08/19/23 0629  NA 156* 156*  --  150*  --   --  146*  --  148*  K 3.9 3.9  --  3.2*  --   --  3.3*  --  4.6  CL 118* 117*  --  116*  --   --  111  --  114*  CO2 27 25  --  26  --   --  27  --  24  GLUCOSE 222* 205*  --  198*  --   --  157*  --  101*  BUN 45* 40*  --  24*  --   --  17  --  13  CREATININE 0.99 0.87  --  0.67  --   --  0.51  --  0.40*  CALCIUM 9.4 8.9  --  8.2*  --   --  7.9*  --  8.3*  MG  --  1.8   < > 1.4* 2.3  --  2.2 2.1 2.0  PHOS  --  3.2   < > 1.6* 4.5 4.4 3.7 2.8 2.4*   < > = values in this interval not displayed.   GFR: Estimated Creatinine Clearance: 72.1 mL/min (A) (by C-G formula based on SCr of 0.4 mg/dL (L)). Recent Labs  Lab 08/16/23 0205 08/16/23 0340 08/16/23 0656  08/16/23 0947 08/17/23 0513 08/17/23 0819 08/17/23 1119 08/18/23 0221  PROCALCITON  --   --  0.17  --   --   --   --   --   WBC 13.4*  --   --   --  16.4* 13.3*  --  11.5*  LATICACIDVEN 1.5 4.0* 5.3* 5.0*  --   --  3.5*  --  Liver Function Tests: Recent Labs  Lab 08/16/23 0205 08/16/23 0656 08/17/23 0513  AST 78* 57* 24  ALT 74* 60* 35  ALKPHOS 40 40 34*  BILITOT 0.8 0.6 0.3  PROT 6.6 5.7* 4.7*  ALBUMIN 2.9* 2.5* 2.0*   No results for input(s): "LIPASE", "AMYLASE" in the last 168 hours. No results for input(s): "AMMONIA" in the last 168 hours.  ABG:    Component Value Date/Time   PHART 7.42 08/16/2023 0348   PCO2ART 46 08/16/2023 0348   PO2ART 100 08/16/2023 0348   HCO3 29.2 (H) 08/16/2023 0348   O2SAT 98.5 08/16/2023 0348    Coagulation Profile: Recent Labs  Lab 08/16/23 0205  INR 1.1   Cardiac Enzymes: No results for input(s): "CKTOTAL", "CKMB", "CKMBINDEX", "TROPONINI" in the last 168 hours.  HbA1C: Hemoglobin A1C  Date/Time Value Ref Range Status  08/21/2014 02:40 PM 11.1 (H) 4.2 - 6.3 % Final    Comment:    The American Diabetes Association recommends that a primary goal of therapy should be <7% and that physicians should reevaluate the treatment regimen in patients with HbA1c values consistently >8%.   02/11/2013 08:04 PM 13.3 (H) 4.2 - 6.3 % Final    Comment:    The American Diabetes Association recommends that a primary goal of therapy should be <7% and that physicians should reevaluate the treatment regimen in patients with HbA1c values consistently >8%.    Hgb A1c MFr Bld  Date/Time Value Ref Range Status  08/16/2023 09:47 AM 5.0 4.8 - 5.6 % Final    Comment:    (NOTE) Pre diabetes:          5.7%-6.4%  Diabetes:              >6.4%  Glycemic control for   <7.0% adults with diabetes   02/24/2020 07:12 PM 7.7 (H) 4.8 - 5.6 % Final    Comment:    (NOTE) Pre diabetes:          5.7%-6.4%  Diabetes:              >6.4%  Glycemic  control for   <7.0% adults with diabetes    CBG: Recent Labs  Lab 08/18/23 1511 08/18/23 1919 08/18/23 2312 08/19/23 0312 08/19/23 0729  GLUCAP 146* 98 106* 93 106*   CRITICAL CARE Performed by: Erick Alley  Attempted to call patients husband with updates, left HIPAA compliant VM.   Total critical care time: 35 minutes  Critical care time was exclusive of separately billable procedures and treating other patients. Critical care was necessary to treat or prevent imminent or life-threatening deterioration.  Critical care was time spent personally by me on the following activities: development of treatment plan with patient and/or surrogate as well as nursing, discussions with consultants, evaluation of patient's response to treatment, examination of patient, obtaining history from patient or surrogate, ordering and performing treatments and interventions, ordering and review of laboratory studies, ordering and review of radiographic studies, pulse oximetry and re-evaluation of patient's condition.   Erick Alley, DO Family Medicine, PGY-3 08/19/2023 11:32 AM   See Loretha Stapler for personal pager PCCM on call pager [phone] until 7pm. Please call Elink 7p-7a. [phone]

## 2023-08-19 NOTE — Evaluation (Signed)
Clinical/Bedside Swallow Evaluation Patient Details  Name: [name] MRN: [id number] Date of Birth: [birth year]  Today's Date: 08/19/2023 Time: [name] Start Time (ACUTE ONLY): 1010 [name] Stop Time (ACUTE ONLY): 1020 [name] Time Calculation (min) (ACUTE ONLY): 10 min  Past Medical History:  Past Medical History:  Diagnosis Date   Allergic rhinitis    Chickenpox    Depression    Diabetes (HCC)    Epilepsy (HCC)    Headache    HTN (hypertension)    Hyperlipidemia    Seizures (HCC)    Stroke (HCC)    Residual L-sided deficits   Urinary incontinence    Past Surgical History:  Past Surgical History:  Procedure Laterality Date   CESAREAN SECTION     INCISION AND DRAINAGE Right 05/13/2016   Procedure: INCISION AND DRAINAGE;  Surgeon: Gwyneth Revels, DPM;  Location: [facility] ORS;  Service: Podiatry;  Laterality: Right;   HPI:  57 year old woman who presented to [name] 11/29 as [name] transfer from [name] for AMS, suspected urosepsis. Patient is [name] resident of the [name]) and had decreased responsiveness throughout the course of the day. [name] noted GCS 6, fever and tachycardia. 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 Patient was intubated 11/29-12/1. PMHx significant for HTN, HLD, CVA (moderate-large R MCA infarct), T2DM, epilepsy, depression. Pt ahs [name] history of dyspahgia after CVA in 2021, last clinical evaluation recommended dys 2/nectar. Cannot find any documentation of an MBS. Doesnt appear to have had one.    Assessment / Plan / Recommendation  Clinical Impression  Pt attentive to PO, but required total assisted feeding. Follows commnds and nods Y to questions. Pt was able to phonate on command with low vocal quality but clear voice. Pt tooks single and consecutive sips of water without cough. Accepted spoons of puree with lingual thrust for oral transit, but no oral residue.   Pt  was recently intubated and has had [name] dysphonic cough and concern for poor secretion management. She also has [name] history of dysphagia, but no instrumental assessment. Recommend instrumental assessment prior to diet initiation given potential for silent aspiration. Will plan for FEES tomorrow; pt may be difficult to transit to MBS given diarrhea and unstable vitals. There is [name] plan for cortrak today, which would be helpful as pt recovers respiratory function. Pt can have small sips of water if desired in the meantime [name] Visit Diagnosis: Dysphagia, unspecified (R13.10)    Aspiration Risk  Risk for inadequate nutrition/hydration    Diet Recommendation Alternative means - temporary;Other (Comment) (sips of water ok with RN)    Liquid Administration via: Straw Medication Administration: Via alternative means    Other  Recommendations      Recommendations for follow up therapy are one component of [name] multi-disciplinary discharge planning process, led by the attending physician.  Recommendations may be updated based on patient status, additional functional criteria and insurance authorization.  Follow up Recommendations        Assistance Recommended at Discharge    Functional Status Assessment    Frequency and Duration            Prognosis        Swallow Study   General HPI: 57 year old woman who presented to [name] 11/29 as [name] transfer from [name] for AMS, suspected urosepsis. Patient is [name] resident of the [name] and had decreased responsiveness throughout the course of the day. [name] noted GCS 6, fever  and tachycardia. 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 Patient was intubated 11/29-12/1. PMHx significant for HTN, HLD, CVA (moderate-large R MCA infarct), T2DM, epilepsy, depression. Pt ahs [name] history of dyspahgia after CVA in 2021, last clinical evaluation recommended dys  2/nectar. Cannot find any documentation of an MBS. Doesnt appear to have had one. Type of Study: Bedside Swallow Evaluation Previous Swallow Assessment: see HPI Diet Prior to this Study: NPO Temperature Spikes Noted: N/[name] Respiratory Status: Room air History of Recent Intubation: Yes Total duration of intubation (days): 3 days Date extubated: 08/18/23 Behavior/Cognition: Alert;Cooperative;Requires cueing Oral Care Completed by [name]: No Oral Cavity - Dentition: Edentulous Self-Feeding Abilities: Total assist Patient Positioning: Other (comment) (leans right) Baseline Vocal Quality: Low vocal intensity (clear) Volitional Cough: Cognitively unable to elicit Volitional Swallow: Able to elicit    Oral/Motor/Sensory Function Overall Oral Motor/Sensory Function: Other (comment) (constant lingual thrusting, sucking on finger)   Ice Chips Ice chips: Not tested   Thin Liquid Thin Liquid: Within functional limits Presentation: Straw    Nectar Thick Nectar Thick Liquid: Not tested   Honey Thick Honey Thick Liquid: Not tested   Puree Puree: Within functional limits Presentation: Spoon   Solid            [name], [name] 08/19/2023,10:29 AM

## 2023-08-19 NOTE — Plan of Care (Addendum)
Noted buttocks with ruptured blister, changed sacral mepilex foam, wound care nurse consulted Problem: Education: Goal: Knowledge of General Education information will improve Description: Including pain rating scale, medication(s)/side effects and non-pharmacologic comfort measures Outcome: Progressing   Problem: Health Behavior/Discharge Planning: Goal: Ability to manage health-related needs will improve Outcome: Progressing   Problem: Clinical Measurements: Goal: Ability to maintain clinical measurements within normal limits will improve Outcome: Progressing Goal: Will remain free from infection Outcome: Progressing Goal: Diagnostic test results will improve Outcome: Progressing Goal: Respiratory complications will improve Outcome: Progressing Goal: Cardiovascular complication will be avoided Outcome: Progressing   Problem: Activity: Goal: Risk for activity intolerance will decrease Outcome: Progressing   Problem: Nutrition: Goal: Adequate nutrition will be maintained Outcome: Progressing   Problem: Coping: Goal: Level of anxiety will decrease Outcome: Progressing   Problem: Elimination: Goal: Will not experience complications related to bowel motility Outcome: Progressing Goal: Will not experience complications related to urinary retention Outcome: Progressing   Problem: Pain Management: Goal: General experience of comfort will improve Outcome: Progressing   Problem: Safety: Goal: Ability to remain free from injury will improve Outcome: Progressing   Problem: Skin Integrity: Goal: Risk for impaired skin integrity will decrease Outcome: Progressing   Problem: Education: Goal: Ability to describe self-care measures that may prevent or decrease complications (Diabetes Survival Skills Education) will improve Outcome: Progressing Goal: Individualized Educational Video(s) Outcome: Progressing   Problem: Coping: Goal: Ability to adjust to condition or change in  health will improve Outcome: Progressing   Problem: Fluid Volume: Goal: Ability to maintain a balanced intake and output will improve Outcome: Progressing   Problem: Health Behavior/Discharge Planning: Goal: Ability to identify and utilize available resources and services will improve Outcome: Progressing Goal: Ability to manage health-related needs will improve Outcome: Progressing   Problem: Metabolic: Goal: Ability to maintain appropriate glucose levels will improve Outcome: Progressing   Problem: Nutritional: Goal: Maintenance of adequate nutrition will improve Outcome: Progressing Goal: Progress toward achieving an optimal weight will improve Outcome: Progressing   Problem: Skin Integrity: Goal: Risk for impaired skin integrity will decrease Outcome: Progressing   Problem: Tissue Perfusion: Goal: Adequacy of tissue perfusion will improve Outcome: Progressing

## 2023-08-19 NOTE — Progress Notes (Signed)
Nutrition Follow-up  DOCUMENTATION CODES:  Non-severe (moderate) malnutrition in context of chronic illness  INTERVENTION:  Tube Feeding via cortrak: Vital AF 1.2 at 55 ml/hr Once tube confirmed, at 35 ml/hr; increase by 10 mL q 6 hours to goal Free water flushes q4h TF at goal provides 1584 kcal, 99g protein, and 1071 mL free water (free water = flush+TF) Monitor magnesium, potassium, and phosphorus BID for at least 3 days, MD to replete as needed, as pt is at risk for refeeding syndrome Add Thiamine 100 mg daily x 7 days  B12 level low normal (typically serum values are high in acute illness/inflammation); recommend supplementing 1000 mcg daily per tube x 30 days given low risk for toxicity   NUTRITION DIAGNOSIS:  Moderate Malnutrition related to chronic illness as evidenced by mild fat depletion, severe muscle depletion, moderate muscle depletion. - Remains applicable  GOAL:  Patient will meet greater than or equal to 90% of their needs - Progressing, cortrak to be placed  MONITOR:  TF tolerance, I & O's, Skin, Labs, Weight trends  REASON FOR ASSESSMENT:  Ventilator    ASSESSMENT:  57 yo admitted with septic shock secondary to RLL pneumonia, probable UTI, hypernatremia, shock liver, intubated. PMH includes HTN, HLD, CVA, epilepsy, depression, DM. Pt resides at SNF x 5 years, wheelchair bound  11/29 - presented to [facility] ED with AMS, intubated, transferred to [facility] 12/1 - extubated 12/2 - cortrak placed, imaging pending, SLP evaluation, NPO but FEES planned tomorrow  Pt being connected to EEG at the time of assessment. Recently had cortrak placed and XR is pending to confirm placement.   SLP recommends pt remain NPO but is planning to complete FEES tomorrow for objective swallow study.   Discussed with resident, ok to resume feeds once cortrak is confirmed. Noted that feeds were infusing at 21mL/h prior to extubation. Will resume at 35 and titrate back to  goal. Noted that Na elevated today, free water flushes discussed  with DO and ordered. Electrolytes being replaced per protocol by [facility]  Admit weight: 68 kg  Current weight: 73.8 kg   Intake/Output Summary (Last 24 hours) at 08/19/2023 1149 Last data filed at 08/19/2023 1100 Gross per 24 hour  Intake 680.79 ml  Output 470 ml  Net 210.79 ml  Net IO Since Admission: 9,567.54 mL [08/19/23 1149]  Drains/Lines: Fecal management system placed 12/2  Nutritionally Relevant Medications: Scheduled Meds:  vitamin B-12  1,000 mcg Per Tube Daily   insulin aspart  0-20 Units Subcutaneous Q4H   pantoprazole  40 mg Intravenous QHS   thiamine  100 mg Per Tube Daily   Continuous Infusions:   ceFAZolin (ANCEF) IV     feeding supplement (VITAL AF 1.2 CAL) Stopped (08/18/23)   sodium phosphate 15 mmol in dextrose 5 % 250 mL infusion     Labs Reviewed: Na 148, chloride 114 Creatinine 0.4 Phosphorus 2.4 CBG ranges from 93-158 mg/dL over the last 24 hours HgbA1c 5% (11/29)  NUTRITION - FOCUSED PHYSICAL EXAM: Flowsheet Row Most Recent Value  Orbital Region Mild depletion  Upper Arm Region Mild depletion  [noted signficant sretch marks down bilateral upper arms, shoulders likely indicative of significant and/or rapid wt loss]  Thoracic and Lumbar Region Unable to assess  Buccal Region Unable to assess  Temple Region Mild depletion  Clavicle Bone Region Severe depletion  Clavicle and Acromion Bone Region Severe depletion  Scapular Bone Region Severe depletion  Dorsal Hand Unable to assess  Patellar Region  Severe depletion  Anterior Thigh Region Severe depletion  Posterior Calf Region Severe depletion  Edema (RD Assessment) None    Diet Order:   Diet Order             Diet NPO time specified Except for: Ice Chips, Sips with Meds  Diet effective now                   EDUCATION NEEDS:  Not appropriate for education at this time  Skin: Skin Integrity Issues: DTI: L heel (2 x 3  cm)  Last BM:  12/2 - type 7  Height:  Ht Readings from Last 1 Encounters:  08/16/23 5\' 2"  (1.575 m)    Weight:  Wt Readings from Last 1 Encounters:  08/19/23 73.8 kg    Ideal Body Weight:  50 kg  BMI:  Body mass index is 29.76 kg/m.  Estimated Nutritional Needs:  Kcal:  1600-1800 kcals Protein:  90-110 g Fluid:  >/= 1.8 L    Greig Castilla, RD, LDN Registered Dietitian II RD pager # available in [facility]  After hours/weekend pager # available in [facility]

## 2023-08-19 NOTE — TOC Initial Note (Addendum)
Transition of Care [facility]) - Initial/Assessment Note    Patient Details  Name: [name] MRN: [id number] Date of Birth: [birth year]  Transition of Care [facility]) CM/SW Contact:    Marliss Coots, LCSW Phone Number: 08/19/2023, 3:51 PM  Clinical Narrative:                  This CSW called patient's husband, Alonnie Trueman, to inquire about discharge plans. Mr. Toll confirmed that patient is to return to [facility] SNF upon discharge as medically necessary. CSW offered to call SNF to inform them of patient's admission to [facility] and discharge plan of returning to SNF. Mr. Stombaugh accepted this offer.  CSW attempted to call admissions of [facility] to inform them of patient's admission and discharge plan. There was no response and a voicemail was left requesting a return call.  Expected Discharge Plan: Skilled Nursing Facility Barriers to Discharge: Continued Medical Work up   Patient Goals and CMS Choice Patient states their goals for this hospitalization and ongoing recovery are:: SNF CMS Medicare.gov Compare Post Acute Care list provided to:: Patient Represenative (must comment) Choice offered to / list presented to : [facility] POA / Guardian      Expected Discharge Plan and Services In-house Referral: Clinical Social Work   Post Acute Care Choice: Skilled Nursing Facility Living arrangements for the past 2 months: Skilled Nursing Facility                                      Prior Living Arrangements/Services Living arrangements for the past 2 months: Skilled Nursing Facility Lives with:: Facility Resident Patient language and need for interpreter reviewed:: Yes        Need for Family Participation in Patient Care: Yes (Comment) Care giver support system in place?: Yes (comment)   Criminal Activity/Legal Involvement Pertinent to Current Situation/Hospitalization: No - Comment as needed  Activities of Daily  Living      Permission Sought/Granted   Permission granted to share information with : Yes, Release of Information Signed  Share Information with NAME: Desirea Loosli  Permission granted to share info w AGENCY: [facility]  Permission granted to share info w Relationship: Spouse  Permission granted to share info w Contact Information: [phone]  Emotional Assessment         Alcohol / Substance Use: Not Applicable Psych Involvement: No (comment)  Admission diagnosis:  Sepsis (HCC) [A41.9] Altered mental status, unspecified altered mental status type [R41.82] Sepsis, due to unspecified organism, unspecified whether acute organ dysfunction present [facility]) [A41.9] Patient Active Problem List   Diagnosis Date Noted   Malnutrition of moderate degree 08/19/2023   Altered mental status 08/19/2023   Pneumonia of both lower lobes due to methicillin susceptible Staphylococcus aureus (MSSA) (HCC) 08/19/2023   Anemia 08/17/2023   Septic shock (HCC) 08/17/2023   UTI (urinary tract infection) 03/19/2020   Distal radial fracture_left 03/19/2020   Stroke (HCC)    Acute respiratory failure with hypoxia (HCC)    Sepsis (HCC)    Pneumonia of left lower lobe due to infectious organism    Iron deficiency anemia    Shortness of breath    Lobar pneumonia (HCC)    Weakness 12/20/2019   Hemiparesis affecting left side as late effect of cerebrovascular accident (CVA) (HCC) 12/20/2019   Type 2 diabetes mellitus with hyperlipidemia (HCC) 12/20/2019  Seizure disorder as sequela of cerebrovascular accident (HCC) 12/20/2019   Urinary incontinence 12/20/2019   Self-care deficit 12/20/2019   Fall    Lacunar stroke, acute (HCC) 07/24/2019   Seizure (HCC) 07/24/2019   Elevated troponin 07/24/2019   AKI (acute kidney injury) (HCC) 07/24/2019   Acute metabolic encephalopathy 07/23/2019   Cerebral infarction (HCC) 08/14/2016   Recurrent cerebrovascular accidents (CVAs) (HCC)  08/14/2016   Delirium due to another medical condition 08/13/2016   Urinary tract infection 08/13/2016   Right foot infection 05/11/2016   Foot abscess, right 05/11/2016   Anxiety and depression 12/02/2015   Cerebral infarction (HCC) 11/27/2015   Epilepsy (HCC) 06/17/2015   Complicated migraine 06/14/2015   Headache 05/19/2015   Weakness of left side of body 05/19/2015   History of cardioembolic cerebrovascular accident (CVA) 05/19/2015   Morbid obesity with BMI of 40.0-44.9, adult (HCC) 03/19/2014   HTN (hypertension)    Diabetes mellitus without complication (HCC)    Hyperlipidemia    [name] (transient ischemic attack) 03/17/2014   PCP:  [facility]:   [facility] [address]), [place] - [address] [place]) [place] [address] Phone: [phone] Fax: [phone]     Social Determinants of Health (SDOH) Social History: SDOH Screenings   Tobacco Use: Low Risk  (08/16/2023)   SDOH Interventions:     Readmission Risk Interventions     No data to display

## 2023-08-19 NOTE — Procedures (Signed)
Patient Name: Samiria Sundell  MRN: [id number]  Epilepsy Attending: Charlsie Quest  Referring Physician/Provider: Erick Alley, DO  Date: 08/19/2023 Duration: 22.05 mins  Patient history: 57 yo F with R MCA infarct and epilepsy. EEG to evaluate for seizure  Level of alertness: Awake  AEDs during EEG study: VPA  Technical aspects: This EEG study was done with scalp electrodes positioned according to the 10-20 International system of electrode placement. Electrical activity was reviewed with band pass filter of 1-70Hz , sensitivity of 7 uV/mm, display speed of 8mm/sec with a 60Hz  notched filter applied as appropriate. EEG data were recorded continuously and digitally stored.  Video monitoring was available and reviewed as appropriate.  Description: The posterior dominant rhythm consists of 7 Hz activity of moderate voltage (25-35 uV) seen predominantly in posterior head regions, symmetric and reactive to eye opening and eye closing. EEG showed continuous generalized 3 to 6 Hz theta-delta slowing. Hyperventilation and photic stimulation were not performed.     ABNORMALITY - Continuous slow, generalized  IMPRESSION: This study is suggestive of moderate diffuse encephalopathy. No seizures or epileptiform discharges were seen throughout the recording.  Sakai Wolford [name]

## 2023-08-19 NOTE — Procedures (Signed)
Cortrak  Person Inserting Tube:  Joselin Crandell C, RD Tube Type:  Cortrak - 43 inches Tube Size:  10 Tube Location:  Right nare Secured by: Bridle Technique Used to Measure Tube Placement:  Marking at nare/corner of mouth Cortrak Secured At:  63 cm   Cortrak Tube Team Note:  Consult received to place a Cortrak feeding tube.   X-ray is required, abdominal x-ray has been ordered by the Cortrak team. Please confirm tube placement before using the Cortrak tube.   If the tube becomes dislodged please keep the tube and contact the Cortrak team at [email or web address] for replacement.  If after hours and replacement cannot be delayed, place a NG tube and confirm placement with an abdominal x-ray.    Dajuana Palen P., RD, LDN, CNSC See AMiON for contact information    

## 2023-08-19 NOTE — Progress Notes (Signed)
EEG complete - results pending 

## 2023-08-20 ENCOUNTER — Encounter (HOSPITAL_COMMUNITY): Payer: Self-pay | Admitting: Pulmonary Disease

## 2023-08-20 DIAGNOSIS — Z978 Presence of other specified devices: Secondary | ICD-10-CM

## 2023-08-20 DIAGNOSIS — A419 Sepsis, unspecified organism: Secondary | ICD-10-CM | POA: Diagnosis not present

## 2023-08-20 DIAGNOSIS — N179 Acute kidney failure, unspecified: Secondary | ICD-10-CM | POA: Diagnosis not present

## 2023-08-20 DIAGNOSIS — E878 Other disorders of electrolyte and fluid balance, not elsewhere classified: Secondary | ICD-10-CM

## 2023-08-20 HISTORY — PX: SLP FEES STUDY: SLP1001

## 2023-08-20 LAB — GLUCOSE, CAPILLARY
Glucose-Capillary: 163 mg/dL — ABNORMAL HIGH (ref 70–99)
Glucose-Capillary: 149 mg/dL — ABNORMAL HIGH (ref 70–99)
Glucose-Capillary: 196 mg/dL — ABNORMAL HIGH (ref 70–99)
Glucose-Capillary: 187 mg/dL — ABNORMAL HIGH (ref 70–99)
Glucose-Capillary: 238 mg/dL — ABNORMAL HIGH (ref 70–99)

## 2023-08-20 LAB — BASIC METABOLIC PANEL WITH GFR
Anion gap: 9 (ref 5–15)
BUN: 8 mg/dL (ref 6–20)
CO2: 25 mmol/L (ref 22–32)
Calcium: 8.3 mg/dL — ABNORMAL LOW (ref 8.9–10.3)
Chloride: 105 mmol/L (ref 98–111)
Creatinine, Ser: 0.49 mg/dL (ref 0.44–1.00)
GFR, Estimated: >60 mL/min
Glucose, Bld: 160 mg/dL — ABNORMAL HIGH (ref 70–99)
Potassium: 3.4 mmol/L — ABNORMAL LOW (ref 3.5–5.1)
Sodium: 139 mmol/L (ref 135–145)

## 2023-08-20 LAB — CBC
HCT: 30.2 % — ABNORMAL LOW (ref 36.0–46.0)
Hemoglobin: 9.8 g/dL — ABNORMAL LOW (ref 12.0–15.0)
MCH: 23.4 pg — ABNORMAL LOW (ref 26.0–34.0)
MCHC: 32.5 g/dL (ref 30.0–36.0)
MCV: 72.1 fL — ABNORMAL LOW (ref 80.0–100.0)
Platelets: 212 10*3/uL (ref 150–400)
RBC: 4.19 MIL/uL (ref 3.87–5.11)
RDW: 20.2 % — ABNORMAL HIGH (ref 11.5–15.5)
WBC: 6.8 10*3/uL (ref 4.0–10.5)
nRBC: 0 % (ref 0.0–0.2)

## 2023-08-20 LAB — PHOSPHORUS: Phosphorus: 2.1 mg/dL — ABNORMAL LOW (ref 2.5–4.6)

## 2023-08-20 LAB — MAGNESIUM: Magnesium: 1.6 mg/dL — ABNORMAL LOW (ref 1.7–2.4)

## 2023-08-20 LAB — PATHOLOGIST SMEAR REVIEW: Path Review: NORMAL

## 2023-08-20 MED ORDER — MEDIHONEY WOUND/BURN DRESSING EX PSTE
1.0000 | PASTE | Freq: Every day | CUTANEOUS | Status: DC
  Administered 2023-08-20 – 2023-08-26 (×6): 1 via TOPICAL
  Filled 2023-08-20: qty 44

## 2023-08-20 MED ORDER — GERHARDT'S BUTT CREAM
TOPICAL_CREAM | Freq: Two times a day (BID) | CUTANEOUS | Status: DC
  Filled 2023-08-20: qty 1

## 2023-08-20 MED ORDER — IPRATROPIUM-ALBUTEROL 0.5-2.5 (3) MG/3ML IN SOLN
3.0000 mL | Freq: Three times a day (TID) | RESPIRATORY_TRACT | Status: DC
  Administered 2023-08-20 – 2023-08-21 (×4): 3 mL via RESPIRATORY_TRACT
  Filled 2023-08-20 (×3): qty 3

## 2023-08-20 MED ORDER — AMLODIPINE BESYLATE 10 MG PO TABS
10.0000 mg | ORAL_TABLET | Freq: Every day | ORAL | Status: DC
Start: 2023-08-20 — End: 2023-08-22
  Administered 2023-08-20 – 2023-08-21 (×2): 10 mg
  Filled 2023-08-20 (×2): qty 1

## 2023-08-20 NOTE — Consult Note (Signed)
WOC Nurse Consult Note: Reason for Consult: buttocks  Wound type: 1. Deep Tissue Pressure Injuries B heels 2.  Stage 3 pressure injury buttocks 3. Full thickness R medial lower leg  Pressure Injury POA: no, only L DTPI charted on admission  Measurement: 1.  R heel 2 cm x 3 cm DTPI purple maroon discoloration; L heel DTPI 3 cm x 3 cm purple maroon discoloration 2.  Stage 3 Pressure Injuries L buttock 2 cm x 1 cm x 0.1 cm 50% tan 50% pink moist;  R buttock 3 cm x 3 cm x 0.1 cm 30% tan fibrin 70% pink moist  3. Full thickness R medial lower leg 1 cm x 0.6 cm x 0.1 cm 100% yellow dry  Wound bed: as above  Drainage (amount, consistency, odor) minimal serosanguinous buttocks  Periwound: skin macerated to coccyx buttocks likely from moisture and friction; patient has rectal pouch noted in place distal to these areas  Dressing procedure/placement/frequency:  Clean buttocks PI with NS, apply Medihoney to wound beds daily, cover with dry gauze. Coat surrounding skin with Gerhardt's Butt Cream 2 times daily and prn soiling. Cover with silicone foam or ABD pad whichever is preferred.  Clean heels with soap and water, place a single layer Xeroform gauze [place] [phone]) to purple maroon discoloration daily.  Cover with dry gauze and Kerlix roll gauze. Place bilateral feet in Prevalon boots to offload pressure.  Clean R medial lower leg wound with NS, apply a small piece of Xeroform gauze [place] [phone]) to wound bed daily and secure with silicone foam.  May lift foam daily to replace Xeroform.  Change foam q3 days and prn soiling.   Patient should be placed on a low air loss mattress for pressure redistribution and moisture management.   POC discussed with bedside nurse. WOC team will follow every 7 to 10 days to evaluate pressure injuries.   Thank you,    Priscella Mann MSN, RN-BC, [employer] [phone]

## 2023-08-20 NOTE — Procedures (Signed)
Objective Swallowing Evaluation: Type of Study: Fiberoptic Endoscopic Evaluation of Swallowing   Patient Details  Name: [name] MRN: [id number] Date of Birth: [birth year]  Today's Date: 08/20/2023 Time: SLP Start Time (ACUTE ONLY): 1030 -SLP Stop Time (ACUTE ONLY): 1045  SLP Time Calculation (min) (ACUTE ONLY): 15 min   Past Medical History:  Past Medical History:  Diagnosis Date   Allergic rhinitis    Chickenpox    Depression    Diabetes (HCC)    Epilepsy (HCC)    Headache    HTN (hypertension)    Hyperlipidemia    Seizures (HCC)    Stroke (HCC)    Residual L-sided deficits   Urinary incontinence    Past Surgical History:  Past Surgical History:  Procedure Laterality Date   CESAREAN SECTION     INCISION AND DRAINAGE Right 05/13/2016   Procedure: INCISION AND DRAINAGE;  Surgeon: Gwyneth Revels, DPM;  Location: [facility] ORS;  Service: Podiatry;  Laterality: Right;   HPI: 57 year old woman who presented to [facility] 11/29 as a transfer from [facility] for AMS, suspected urosepsis. Patient is a resident of the [facility] [place]) and had decreased responsiveness throughout the course of the day. EMS noted GCS 6, fever and tachycardia. 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 Patient was intubated 11/29-12/1. PMHx significant for HTN, HLD, CVA (moderate-large R MCA infarct), T2DM, epilepsy, depression. 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 Cannot find any documentation of an MBS. Doesnt appear to have had one.   No data recorded   Recommendations for follow up therapy are one component of a multi-disciplinary discharge planning process, led by the attending physician.  Recommendations may be updated based on patient status, additional functional criteria and insurance authorization.  Assessment / Plan /  Recommendation     08/20/2023   11:00 AM  Clinical Impressions  Clinical Impression Pt demosntrates baseline cognitive impairment, head contracted to the right. On FEES pt was able to accept all PO with total assissted feeding, but could not follow motor commands for pharyngeal assessment like coughing, swallowing or phonating on command. Pt observed to have standing foamy secretions in the right pyriform and lateral channel, slightly into vestibule. When given sips this immediately clears. Pt has timely swallow with straw sips, but sometimes delayed initiation with small amounts of fluid like ice or tastes of puree. Pt has oral residue as well that pools on the right side of pharynx. If high penetration does occur, pt clears with a swallow. No aspiration occurred. Pt does masticate with gums, but oral preparation is prolonged with posterior spillage of material and secretions. Recommend resuming thin liquids, pills whole in puree. Will attempt dys 2 solids, but pt may ultimately need puree.         08/20/2023   11:00 AM  Treatment Recommendations  Treatment Recommendations Therapy as outlined in treatment plan below        08/20/2023   11:00 AM  Prognosis  Prognosis for improved oropharyngeal function Good       08/20/2023   11:00 AM  Diet Recommendations  SLP Diet Recommendations Dysphagia 2 (Fine chop) solids;Thin liquid  Liquid Administration via Cup;Straw  Medication Administration Whole meds with puree  Compensations Slow rate;Small sips/bites  Postural Changes Seated upright at 90 degrees;Remain semi-upright after after feeds/meals (Comment)         08/20/2023   11:00 AM  Other Recommendations  Oral Care Recommendations Oral care BID  Follow Up Recommendations Skilled nursing-short term rehab (<3 hours/day)  Functional Status Assessment Patient has had a recent decline in their functional status and demonstrates the ability to make significant improvements in function in a  reasonable and predictable amount of time.       08/20/2023   11:00 AM  Frequency and Duration   Speech Therapy Frequency (ACUTE ONLY) min 2x/week  Treatment Duration 2 weeks         08/20/2023   11:00 AM  Oral Phase  Oral Phase Impaired  Oral - Thin Straw Lingual/palatal residue  Oral - Puree Delayed oral transit  Oral - Regular Delayed oral transit;Decreased bolus cohesion;Holding of bolus;Reduced posterior propulsion       08/20/2023   11:00 AM  Pharyngeal Phase  Pharyngeal Phase Impaired  Pharyngeal- Thin Cup Delayed swallow initiation-pyriform sinuses;Lateral channel residue  Pharyngeal- Thin Straw WFL  Pharyngeal- Puree Delayed swallow initiation-pyriform sinuses         No data to display           Kersten Salmons, Riley Nearing 08/20/2023, 11:50 AM

## 2023-08-20 NOTE — Progress Notes (Signed)
PROGRESS NOTE  [name]    DOB: May 25, 1966, 57 y.o.  ZOX:[id number]    Code Status: Full Code   DOA: 08/16/2023   LOS: 4   Brief hospital course  [name] is a 57 y.o. female with a PMH significant for HTN, HLD, CVA (moderate-large R MCA infarct), T2DM, epilepsy, depression.    Patient is a resident of the [facility] [place]) and had decreased responsiveness throughout the course of the day. EMS noted GCS 6, fever and tachycardia. Labs were notable for WBC 13.4, Hgb 9.8, Plt 286. INR 1.1. Na 156, K 3.9, CO2 27, Cr 0.99 (baseline 0.8-1), mildly elevated transaminases 78/74. LA 1.5 > 4.0. ABG 7.42/46/100/29.2. COVID/Flu/RSV negative. UA +protein, nitrites, trace leuks. CXR without active disease. 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 Patient was intubated at [facility] for airway protection.   PCCM consulted for ICU admission and transfer. Extubated 12/1. Cortrack placed.   08/20/23 -stable. Unresponsive   Assessment & Plan  Principal Problem:   Sepsis (HCC) Active Problems:   Anemia   Septic shock (HCC)   Malnutrition of moderate degree   Altered mental status   Pneumonia of both lower lobes due to methicillin susceptible Staphylococcus aureus (MSSA) (HCC)  Shock, presumed septic- improved, off pressors.  - CAP, MSSA on trach aspirate. Extubated 12/1 - MSSA on blood cx likely contaminate - Urine cx negative - continue Ancef 7 day course to tx MSSA PNA -cont scheduled duonebs    Epilepsy- EEG 12/2 did not show active seizure activity. Consistent with moderate diffuse encephalopathy - Continue home Depakote  CVA- Moderate-large R MCA infarct, present prior to 03/2014. Follows with [facility]. Multiple admissions for [name]/CVA, last 03/2020. CT Head NAICA, chronic/remote changes present - restart home ASA, plavix and statin    AoC anemia -baseline about 8.  s/p 1 unit pRBCs 11/30. Hgb stable at 9.8   Electrolyte derangements- Mg++ 1.6, Phos 2.1, K+ 3.4 Feeds via cortrack -daily monitoring and replete PRN   HTN  HLD - titrating back on home antihypertensives. Amlodipine started today - continue statin   T2DM w hyperglycemia  - holding home metformin - incr SSI   Depression - restart home zoloft   Body mass index is 30.48 kg/m.  VTE ppx: heparin injection 5,000 Units Start: 08/16/23 1400 SCDs Start: 08/16/23 0435   Diet:     Diet   Diet NPO time specified Except for: Ice Chips, Sips with Meds   Consultants: CCM  Subjective 08/20/23    Pt reports nothing. Not responsive to voice or pain   Objective   Vitals:   08/19/23 2355 08/20/23 0000 08/20/23 0336 08/20/23 0736  BP: (!) 125/90 (!) 145/97 (!) 155/98   Pulse: (!) 111 98 (!) 115   Resp: 16  16   Temp: 98.5 F (36.9 C)  98.4 F (36.9 C)   TempSrc: Axillary  Oral   SpO2: 97%  100% 97%  Weight:   75.6 kg   Height:        Intake/Output Summary (Last 24 hours) at 08/20/2023 0757 Last data filed at 08/20/2023 0418 Gross per 24 hour  Intake 335.19 ml  Output 2025 ml  Net -1689.81 ml   Filed Weights   08/18/23 0500 08/19/23 0500 08/20/23 0336  Weight: 73.7 kg 73.8 kg 75.6 kg    Physical Exam:  General: obtunded HEENT: atraumatic Respiratory: normal respiratory effort. Decreased bilateral bases Cardiovascular: quick  capillary refill, normal S1/S2, RRR, no JVD, murmurs Gastrointestinal: soft, NT, ND Nervous: not responsive to voice or mild pain stimulation  Extremities: mild edema Skin: dry, intact, normal temperature, normal color. No rashes, lesions or ulcers on exposed skin  Labs   I have personally reviewed the following labs and imaging studies CBC    Component Value Date/Time   WBC 11.1 (H) 08/19/2023 1117   RBC 3.56 (L) 08/19/2023 1117   HGB 8.3 (L) 08/19/2023 1117   HGB 10.6 (L) 12/28/2014 0458   HCT 25.2 (L) 08/19/2023 1117   HCT 33.5 (L)  12/28/2014 0458   PLT 183 08/19/2023 1117   PLT 148 (L) 12/28/2014 0458   MCV 70.8 (L) 08/19/2023 1117   MCV 63 (L) 12/28/2014 0458   MCH 23.3 (L) 08/19/2023 1117   MCHC 32.9 08/19/2023 1117   RDW 19.9 (H) 08/19/2023 1117   RDW 19.0 (H) 12/28/2014 0458   LYMPHSABS 2.5 08/16/2023 0205   LYMPHSABS 1.6 12/28/2014 0458   MONOABS 1.9 (H) 08/16/2023 0205   MONOABS 0.7 12/28/2014 0458   EOSABS 0.0 08/16/2023 0205   EOSABS 0.2 12/28/2014 0458   BASOSABS 0.1 08/16/2023 0205   BASOSABS 0.1 12/28/2014 0458      Latest Ref Rng & Units 08/19/2023    6:29 AM 08/18/2023    2:21 AM 08/17/2023    5:13 AM  BMP  Glucose 70 - 99 mg/dL 161  096  045   BUN 6 - 20 mg/dL 13  17  24    Creatinine 0.44 - 1.00 mg/dL 4.09  8.11  9.14   Sodium 135 - 145 mmol/L 148  146  150   Potassium 3.5 - 5.1 mmol/L 4.6  3.3  3.2   Chloride 98 - 111 mmol/L 114  111  116   CO2 22 - 32 mmol/L 24  27  26    Calcium 8.9 - 10.3 mg/dL 8.3  7.9  8.2     DG Abd Portable 1V  Result Date: 08/19/2023 CLINICAL DATA:  [id number] Encounter for feeding tube placement [id number] EXAM: PORTABLE ABDOMEN - 1 VIEW COMPARISON:  None Available. FINDINGS: The bowel gas pattern is non-obstructive. No evidence of pneumoperitoneum, within the limitations of a supine film. No acute osseous abnormalities. The soft tissues are within normal limits. Surgical changes, devices, tubes and lines: Interval placement of feeding tube with its tip overlying the right midabdomen, likely within the pylorus. IMPRESSION: *Nonobstructive bowel gas pattern. Interval placement of feeding tube with its tip overlying the right midabdomen, likely within the pylorus. Electronically Signed   By: Jules Schick M.D.   On: 08/19/2023 14:26   EEG adult  Result Date: 08/19/2023 [name] Quest, MD     08/19/2023 12:45 PM Patient Name: [name] MRN: [id number] Epilepsy Attending: Charlsie [name] Referring Physician/Provider: Erick Alley, DO Date: 08/19/2023 Duration: 22.05 mins  Patient history: 57 yo F with R MCA infarct and epilepsy. EEG to evaluate for seizure Level of alertness: Awake AEDs during EEG study: VPA Technical aspects: This EEG study was done with scalp electrodes positioned according to the 10-20 International system of electrode placement. Electrical activity was reviewed with band pass filter of 1-70Hz , sensitivity of 7 uV/mm, display speed of 69mm/sec with a 60Hz  notched filter applied as appropriate. EEG data were recorded continuously and digitally stored.  Video monitoring was available and reviewed as appropriate. Description: The posterior dominant rhythm consists of 7 Hz activity of moderate voltage (25-35 uV) seen predominantly in posterior head regions,  symmetric and reactive to eye opening and eye closing. EEG showed continuous generalized 3 to 6 Hz theta-delta slowing. Hyperventilation and photic stimulation were not performed.   ABNORMALITY - Continuous slow, generalized IMPRESSION: This study is suggestive of moderate diffuse encephalopathy. No seizures or epileptiform discharges were seen throughout the recording. Priyanka [name]    Disposition Plan & Communication  Patient status: Inpatient  Admitted From: SNF Planned disposition location: Skilled nursing facility Anticipated discharge date: TBD pending return closer to baseline  Family Communication: none at bedside    Author: Leeroy Bock, DO [facility] 08/20/2023, 7:57 AM   Available by Epic secure chat 7AM-7PM. If 7PM-7AM, please contact night-coverage.  [facility] contact information found on [email or web address].

## 2023-08-20 NOTE — Progress Notes (Addendum)
MEWS Progress Note  Patient Details Name: [name] MRN: [id number] DOB: 26-Mar-1966 Today's Date: 08/20/2023   MEWS Flowsheet Documentation:  Assess: MEWS Score Temp: 98.4 F (36.9 C) BP: (!) 155/98 MAP (mmHg): 117 Pulse Rate: (!) 115 ECG Heart Rate: (!) 117 Resp: 16 Level of Consciousness: Responds to Voice SpO2: 100 % O2 Device: Room Air Patient Activity (if Appropriate): In bed O2 Flow Rate (L/min): 2 L/min FiO2 (%): 24 % Assess: MEWS Score MEWS Temp: 0 MEWS Systolic: 0 MEWS Pulse: 2 MEWS RR: 0 MEWS LOC: 1 MEWS Score: 3 MEWS Score Color: Yellow Assess: SIRS CRITERIA SIRS Temperature : 0 SIRS Respirations : 0 SIRS Pulse: 1 SIRS WBC: 0 SIRS Score Sum : 1 SIRS Temperature : 0 SIRS Pulse: 1 SIRS Respirations : 0 SIRS WBC: 0 SIRS Score Sum : 1 Assess: if the MEWS score is Yellow or Red Were vital signs accurate and taken at a resting state?: Yes Does the patient meet 2 or more of the SIRS criteria?: No MEWS guidelines implemented : No, previously yellow, continue vital signs every 4 hours   On call provider notified        Luberta Robertson [facility] 08/20/2023, 3:47 AM

## 2023-08-20 NOTE — Plan of Care (Signed)

## 2023-08-21 ENCOUNTER — Encounter (HOSPITAL_COMMUNITY): Payer: Self-pay | Admitting: Internal Medicine

## 2023-08-21 DIAGNOSIS — G40909 Epilepsy, unspecified, not intractable, without status epilepticus: Secondary | ICD-10-CM

## 2023-08-21 DIAGNOSIS — I69391 Dysphagia following cerebral infarction: Secondary | ICD-10-CM | POA: Diagnosis not present

## 2023-08-21 DIAGNOSIS — L8990 Pressure ulcer of unspecified site, unspecified stage: Secondary | ICD-10-CM

## 2023-08-21 DIAGNOSIS — E440 Moderate protein-calorie malnutrition: Secondary | ICD-10-CM

## 2023-08-21 DIAGNOSIS — J15211 Pneumonia due to Methicillin susceptible Staphylococcus aureus: Secondary | ICD-10-CM | POA: Diagnosis not present

## 2023-08-21 DIAGNOSIS — A419 Sepsis, unspecified organism: Secondary | ICD-10-CM | POA: Diagnosis not present

## 2023-08-21 LAB — CULTURE, BLOOD (ROUTINE X 2)
Culture: NO GROWTH
Special Requests: ADEQUATE

## 2023-08-21 LAB — CBC
HCT: 24.9 % — ABNORMAL LOW (ref 36.0–46.0)
Hemoglobin: 8.1 g/dL — ABNORMAL LOW (ref 12.0–15.0)
MCH: 23.3 pg — ABNORMAL LOW (ref 26.0–34.0)
MCHC: 32.5 g/dL (ref 30.0–36.0)
MCV: 71.6 fL — ABNORMAL LOW (ref 80.0–100.0)
Platelets: 214 10*3/uL (ref 150–400)
RBC: 3.48 MIL/uL — ABNORMAL LOW (ref 3.87–5.11)
RDW: 20.6 % — ABNORMAL HIGH (ref 11.5–15.5)
WBC: 7 10*3/uL (ref 4.0–10.5)
nRBC: 0 % (ref 0.0–0.2)

## 2023-08-21 LAB — COMPREHENSIVE METABOLIC PANEL
ALT: 47 U/L — ABNORMAL HIGH (ref 0–44)
AST: 42 U/L — ABNORMAL HIGH (ref 15–41)
Albumin: 2 g/dL — ABNORMAL LOW (ref 3.5–5.0)
Alkaline Phosphatase: 47 U/L (ref 38–126)
Anion gap: 5 (ref 5–15)
BUN: 9 mg/dL (ref 6–20)
CO2: 27 mmol/L (ref 22–32)
Calcium: 7.9 mg/dL — ABNORMAL LOW (ref 8.9–10.3)
Chloride: 106 mmol/L (ref 98–111)
Creatinine, Ser: 0.6 mg/dL (ref 0.44–1.00)
GFR, Estimated: >60 mL/min (ref >60)
Glucose, Bld: 215 mg/dL — ABNORMAL HIGH (ref 70–99)
Potassium: 3.8 mmol/L (ref 3.5–5.1)
Sodium: 138 mmol/L (ref 135–145)
Total Bilirubin: 0.2 mg/dL (ref <1.2)
Total Protein: 4.8 g/dL — ABNORMAL LOW (ref 6.5–8.1)

## 2023-08-21 LAB — GLUCOSE, CAPILLARY
Glucose-Capillary: 182 mg/dL — ABNORMAL HIGH (ref 70–99)
Glucose-Capillary: 190 mg/dL — ABNORMAL HIGH (ref 70–99)
Glucose-Capillary: 219 mg/dL — ABNORMAL HIGH (ref 70–99)
Glucose-Capillary: 219 mg/dL — ABNORMAL HIGH (ref 70–99)
Glucose-Capillary: 222 mg/dL — ABNORMAL HIGH (ref 70–99)
Glucose-Capillary: 223 mg/dL — ABNORMAL HIGH (ref 70–99)

## 2023-08-21 MED ORDER — IPRATROPIUM-ALBUTEROL 0.5-2.5 (3) MG/3ML IN SOLN: 3.0000 mL | Freq: Four times a day (QID) | RESPIRATORY_TRACT | Status: DC | PRN

## 2023-08-21 NOTE — Plan of Care (Signed)

## 2023-08-21 NOTE — Assessment & Plan Note (Addendum)
08-21-2023 continue with depakene 08-24-2023 continue with depakene. No breakthrough seizures

## 2023-08-21 NOTE — Assessment & Plan Note (Addendum)
08-21-2023 unclear what pt's baseline is. Pt lives in SNF prior to admission. 08-23-2023 pt is more awake today. Placed into recliner by OT 08-24-2023 pt is stable. This appear at her baseline. 08-25-2023 appeared to have a "spell" last night. No seizures noted. CT head negative for new CVA. EEG shows moderate diffuse encephalopathy.  Pt has been made DNR by pt's husband per palliative care service.

## 2023-08-21 NOTE — Assessment & Plan Note (Signed)
08-21-2023 Hg 8.1 g/dl today.   Iron studies show iron deficiency. On tube feeds that have iron. Iron/TIBC/Ferritin/ %Sat    Component Value Date/Time   IRON 20 (L) 08/16/2023 0656   TIBC 204 (L) 08/16/2023 0656   FERRITIN 388 (H) 08/16/2023 0656   IRONPCTSAT 10 (L) 08/16/2023 1610

## 2023-08-21 NOTE — Assessment & Plan Note (Deleted)
Since admission to 08-20-2023 improved, off pressors.  - CAP, MSSA on trach aspirate. Extubated 12/1 - MSSA on blood cx likely contaminate - Urine cx negative - continue Ancef 7 day course to tx MSSA PNA -cont scheduled duonebs 08-21-2023 continue with IV Ancef. Today is Day #3 of 7 08-22-2023 discussed with pharmacy. Pt had been on IV rocephin from 11-29 through 12-2.  Will count those days of IV rocephin as therapy towards her treatment of MSSA pneumonia. Will continue IV Ancef for a total of 5 days. Today is Day #4 of 5.

## 2023-08-21 NOTE — Progress Notes (Signed)
PROGRESS NOTE    [name]  WUJ:[id number] DOB: [birth year] DOA: 08/16/2023 PCP: [facility]  Subjective: Pt seen and examined. She is nonverbal. On RA. Has cortrak in her nose.   Hospital Course: HPI: 57 year old woman who presented to [facility] 11/29 as a transfer from [facility] for AMS, suspected urosepsis. PMHx significant for HTN, HLD, CVA (moderate-large R MCA infarct), T2DM, epilepsy, depression.    Patient is a resident of the [facility]) and had decreased responsiveness throughout the course of the day. EMS noted GCS 6, fever and tachycardia. Labs were notable for WBC 13.4, Hgb 9.8, Plt 286. INR 1.1. Na 156, K 3.9, CO2 27, Cr 0.99 (baseline 0.8-1), mildly elevated transaminases 78/74. LA 1.5 > 4.0. ABG 7.42/46/100/29.2. COVID/Flu/RSV negative. UA +protein, nitrites, trace leuks. CXR without active disease. 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 Patient was intubated at [facility] for airway protection.   PCCM consulted for ICU admission and transfer.  Significant Events: Admitted 08/16/2023 for septic shock RLL PNA, UTI, acute respiratory failure with hypoxia Intubated in ER on 08-16-2023 08-21-2023 Care transferred to Hospitalist service   Significant Labs: Admission WBC 13.4, HgB 9.8, Na 156, BUN 45, Scr 0.99  Significant Imaging Studies: Admission CXR Support apparatus in appropriate position. 2. No active disease.3. Possible mild thoracic aortic aneurysm. This could be confirmed with dedicated CT imaging. Admission CT chest/abd/pelvis consolidative opacity right lower lobe greater than middle and upper lobe. Pneumonias in the differential. Recommend follow-up. No bowel obstruction, free air. Minimal areas of fluid and stranding. ET tube and enteric tube. 08-19-2023 EEG suggestive of moderate diffuse encephalopathy. No seizures or epileptiform  discharges were seen throughout the recording.   Antibiotic Therapy: Anti-infectives (From admission, onward)    Start     Dose/Rate Route Frequency Ordered Stop   08/19/23 1400  ceFAZolin (ANCEF) IVPB 2g/100 mL premix        2 g 200 mL/hr over 30 Minutes Intravenous Every 8 hours 08/19/23 0915 08/23/23 0559   08/17/23 0800  vancomycin (VANCOREADY) IVPB 1250 mg/250 mL  Status:  Discontinued        1,250 mg 166.7 mL/hr over 90 Minutes Intravenous Every 24 hours 08/16/23 1908 08/19/23 0915   08/16/23 2200  cefTRIAXone (ROCEPHIN) 2 g in sodium chloride 0.9 % 100 mL IVPB  Status:  Discontinued        2 g 200 mL/hr over 30 Minutes Intravenous Daily at bedtime 08/16/23 0632 08/19/23 0915   08/16/23 2200  vancomycin (VANCOREADY) IVPB 750 mg/150 mL  Status:  Discontinued        750 mg 150 mL/hr over 60 Minutes Intravenous Every 12 hours 08/16/23 0634 08/16/23 0928   08/16/23 0730  vancomycin (VANCOREADY) IVPB 1250 mg/250 mL        1,250 mg 166.7 mL/hr over 90 Minutes Intravenous  Once 08/16/23 0630 08/16/23 0939   08/16/23 0715  azithromycin (ZITHROMAX) 500 mg in sodium chloride 0.9 % 250 mL IVPB  Status:  Discontinued        500 mg 250 mL/hr over 60 Minutes Intravenous Every 24 hours 08/16/23 0628 08/16/23 0629   08/16/23 0645  azithromycin (ZITHROMAX) 500 mg in sodium chloride 0.9 % 250 mL IVPB        500 mg 250 mL/hr over 60 Minutes Intravenous Daily 08/16/23 0629 08/18/23 1148   08/16/23 0215  cefTRIAXone (ROCEPHIN) 2 g in sodium chloride 0.9 % 100 mL  IVPB        2 g 200 mL/hr over 30 Minutes Intravenous  Once 08/16/23 0206 08/16/23 0246       Procedures: 08-16-2023 endotracheal intubation 08-18-2023 extubation 08-19-2023 EEG suggestive of moderate diffuse encephalopathy. No seizures or epileptiform discharges were seen throughout the recording.  08-19-2023 Cortrak placement  Consultants: PCCM    Assessment and Plan: * Septic shock (HCC) Since admission to 08-20-2023 improved,  off pressors.  - CAP, MSSA on trach aspirate. Extubated 12/1 - MSSA on blood cx likely contaminate - Urine cx negative - continue Ancef 7 day course to tx MSSA PNA -cont scheduled duonebs   Pneumonia of both lower lobes due to methicillin susceptible Staphylococcus aureus (MSSA) (HCC) Since admission to 08-20-2023 improved, off pressors.  - CAP, MSSA on trach aspirate. Extubated 12/1 - MSSA on blood cx likely contaminate - Urine cx negative - continue Ancef 7 day course to tx MSSA PNA -cont scheduled duonebs 08-21-2023 continue with IV Ancef. Today is Day #3 of 7  Weakness of left side of body 08-21-2023 chronic.  History of cardioembolic cerebrovascular accident (CVA) 08-21-2023 chronic.  Dysphagia as late effect of cerebrovascular accident (CVA) 08-21-2023 Pt with a history of dyspahgia after CVA in 2021, last clinical evaluation recommended dys 2/nectar.  Seen by ST.  Currently has cortrak.  Pressure injury of skin  - bilateral heels, bilateral buttocks 08-21-2023 present on admission.  Altered mental status 08-21-2023 unclear what pt's baseline is. Pt lives in SNF prior to admission.  Malnutrition of moderate degree 08-21-2023 see RD note 08-21-2023. Moderate Malnutrition related to chronic illness as evidenced by mild fat depletion, severe muscle depletion, moderate muscle depletion.   Iron deficiency anemia 08-21-2023 Hg 8.1 g/dl today.   Iron studies show iron deficiency. On tube feeds that have iron. Iron/TIBC/Ferritin/ %Sat    Component Value Date/Time   IRON 20 (L) 08/16/2023 0656   TIBC 204 (L) 08/16/2023 0656   FERRITIN 388 (H) 08/16/2023 0656   IRONPCTSAT 10 (L) 08/16/2023 0656     Epilepsy (HCC) 08-21-2023 continue with depakene   DVT prophylaxis: heparin injection 5,000 Units Start: 08/16/23 1400 SCDs Start: 08/16/23 0435     Code Status: Full Code Family Communication: no family at bedside Disposition Plan: return to SNF Reason for continuing need for  hospitalization: remains on IV Ancef  Objective: Vitals:   08/21/23 0404 08/21/23 0500 08/21/23 0734 08/21/23 0744  BP: 121/74  103/75   Pulse: (!) 110  (!) 106   Resp: 17     Temp: 98.6 F (37 C)     TempSrc: Oral     SpO2: 97%  98% 98%  Weight:  76.3 kg    Height:        Intake/Output Summary (Last 24 hours) at 08/21/2023 1434 Last data filed at 08/21/2023 0357 Gross per 24 hour  Intake 250 ml  Output 300 ml  Net -50 ml   Filed Weights   08/19/23 0500 08/20/23 0336 08/21/23 0500  Weight: 73.8 kg 75.6 kg 76.3 kg    Examination:  Physical Exam Vitals and nursing note reviewed.  Constitutional:      Comments: Eyes are open but not responsive to verbal stimuli. Does not follow commands.  HENT:     Head: Normocephalic and atraumatic.     Nose:     Comments: +cortrak in her nose Cardiovascular:     Rate and Rhythm: Normal rate and regular rhythm.  Pulmonary:     Effort: Pulmonary effort  is normal. No respiratory distress.     Breath sounds: Normal breath sounds.  Abdominal:     General: Bowel sounds are normal.     Palpations: Abdomen is soft.  Skin:    Capillary Refill: Capillary refill takes less than 2 seconds.     Comments: Bilateral feet wrapped in bulky gauze dressing  Neurological:     Comments: Non-verbal. Does not follow any commands.     Data Reviewed: I have personally reviewed following labs and imaging studies  CBC: Recent Labs  Lab 08/16/23 0205 08/17/23 0513 08/17/23 0819 08/17/23 1531 08/18/23 0221 08/19/23 1117 08/20/23 0821 08/21/23 0729  WBC 13.4*   < > 13.3*  --  11.5* 11.1* 6.8 7.0  NEUTROABS 8.8*  --   --   --   --   --   --   --   HGB 9.8*   < > 6.8* 8.3* 7.4* 8.3* 9.8* 8.1*  HCT 31.1*   < > 22.3* 27.6* 22.6* 25.2* 30.2* 24.9*  MCV 73.0*   < > 72.2*  --  73.4* 70.8* 72.1* 71.6*  PLT 286   < > 182  --  169 183 212 214   < > = values in this interval not displayed.   Basic Metabolic Panel: Recent Labs  Lab 08/17/23 0513  08/17/23 1712 08/17/23 2008 08/18/23 0221 08/18/23 1807 08/19/23 0629 08/20/23 0554 08/21/23 0729  NA 150*  --   --  146*  --  148* 139 138  K 3.2*  --   --  3.3*  --  4.6 3.4* 3.8  CL 116*  --   --  111  --  114* 105 106  CO2 26  --   --  27  --  24 25 27   GLUCOSE 198*  --   --  157*  --  101* 160* 215*  BUN 24*  --   --  17  --  13 8 9   CREATININE 0.67  --   --  0.51  --  0.40* 0.49 0.60  CALCIUM 8.2*  --   --  7.9*  --  8.3* 8.3* 7.9*  MG 1.4* 2.3  --  2.2 2.1 2.0 1.6*  --   PHOS 1.6* 4.5 4.4 3.7 2.8 2.4* 2.1*  --    GFR: Estimated Creatinine Clearance: 73.3 mL/min (by C-G formula based on SCr of 0.6 mg/dL). Liver Function Tests: Recent Labs  Lab 08/16/23 0205 08/16/23 0656 08/17/23 0513 08/21/23 0729  AST 78* 57* 24 42*  ALT 74* 60* 35 47*  ALKPHOS 40 40 34* 47  BILITOT 0.8 0.6 0.3 0.2  PROT 6.6 5.7* 4.7* 4.8*  ALBUMIN 2.9* 2.5* 2.0* 2.0*   Coagulation Profile: Recent Labs  Lab 08/16/23 0205  INR 1.1   CBG: Recent Labs  Lab 08/20/23 1609 08/20/23 2012 08/21/23 0025 08/21/23 0735 08/21/23 1304  GLUCAP 187* 238* 190* 219* 182*   Sepsis Labs: Recent Labs  Lab 08/16/23 0340 08/16/23 0656 08/16/23 0947 08/17/23 1119  PROCALCITON  --  0.17  --   --   LATICACIDVEN 4.0* 5.3* 5.0* 3.5*    Recent Results (from the past 240 hour(s))  Resp panel by RT-PCR (RSV, Flu A&B, Covid) Anterior Nasal Swab     Status: None   Collection Time: 08/16/23  2:05 AM   Specimen: Anterior Nasal Swab  Result Value Ref Range Status   SARS Coronavirus 2 by RT PCR NEGATIVE NEGATIVE Final    Comment: (NOTE) SARS-CoV-2 target  nucleic acids are NOT DETECTED.  The SARS-CoV-2 RNA is generally detectable in upper respiratory specimens during the acute phase of infection. The lowest concentration of SARS-CoV-2 viral copies this assay can detect is 138 copies/mL. A negative result does not preclude SARS-Cov-2 infection and should not be used as the sole basis for treatment  or other patient management decisions. A negative result may occur with  improper specimen collection/handling, submission of specimen other than nasopharyngeal swab, presence of viral mutation(s) within the areas targeted by this assay, and inadequate number of viral copies(<138 copies/mL). A negative result must be combined with clinical observations, patient history, and epidemiological information. The expected result is Negative.  Fact Sheet for Patients:  BloggerCourse.com  Fact Sheet for Healthcare Providers:  SeriousBroker.it  This test is no t yet approved or cleared by the [place] FDA and  has been authorized for detection and/or diagnosis of SARS-CoV-2 by FDA under an Emergency Use Authorization (EUA). This EUA will remain  in effect (meaning this test can be used) for the duration of the COVID-19 declaration under Section 564(b)(1) of the Act, 21 U.S.C.section 360bbb-3(b)(1), unless the authorization is terminated  or revoked sooner.       Influenza A by PCR NEGATIVE NEGATIVE Final   Influenza B by PCR NEGATIVE NEGATIVE Final    Comment: (NOTE) The Xpert Xpress SARS-CoV-2/FLU/RSV plus assay is intended as an aid in the diagnosis of influenza from Nasopharyngeal swab specimens and should not be used as a sole basis for treatment. Nasal washings and aspirates are unacceptable for Xpert Xpress SARS-CoV-2/FLU/RSV testing.  Fact Sheet for Patients: BloggerCourse.com  Fact Sheet for Healthcare Providers: SeriousBroker.it  This test is not yet approved or cleared by the [place] FDA and has been authorized for detection and/or diagnosis of SARS-CoV-2 by FDA under an Emergency Use Authorization (EUA). This EUA will remain in effect (meaning this test can be used) for the duration of the COVID-19 declaration under Section 564(b)(1) of the Act, 21 U.S.C. section  360bbb-3(b)(1), unless the authorization is terminated or revoked.     Resp Syncytial Virus by PCR NEGATIVE NEGATIVE Final    Comment: (NOTE) Fact Sheet for Patients: BloggerCourse.com  Fact Sheet for Healthcare Providers: SeriousBroker.it  This test is not yet approved or cleared by the [place] FDA and has been authorized for detection and/or diagnosis of SARS-CoV-2 by FDA under an Emergency Use Authorization (EUA). This EUA will remain in effect (meaning this test can be used) for the duration of the COVID-19 declaration under Section 564(b)(1) of the Act, 21 U.S.C. section 360bbb-3(b)(1), unless the authorization is terminated or revoked.  Performed at [facility], [address], [place], [place] [address]   Blood Culture (routine x 2)     Status: Abnormal   Collection Time: 08/16/23  2:05 AM   Specimen: BLOOD  Result Value Ref Range Status   Specimen Description   Final    BLOOD BLOOD LEFT HAND Performed at [facility], [address] [place], [place] [address]    Special Requests   Final    BOTTLES DRAWN AEROBIC AND ANAEROBIC Blood Culture adequate volume Performed at [facility], [address], [place], [place] [address]    Culture  Setup Time   Final    GRAM POSITIVE COCCI Gram Stain Report Called to,Read Back By and Verified With: FLYNT,F ON 08/16/23 AT 1850 BY LOY,C AEROBIC AND ANAEROBIC BOTTLES CRITICAL RESULT CALLED TO, READ BACK BY AND VERIFIED WITH: PHARMD J. LEDFORD 08/16/2023 @  2301 BY AB    Culture (A)  Final    STAPHYLOCOCCUS WARNERI STAPHYLOCOCCUS HOMINIS STAPHYLOCOCCUS EPIDERMIDIS THE SIGNIFICANCE OF ISOLATING THIS ORGANISM FROM A SINGLE SET OF BLOOD CULTURES WHEN MULTIPLE SETS ARE DRAWN IS UNCERTAIN. PLEASE NOTIFY THE MICROBIOLOGY DEPARTMENT WITHIN ONE WEEK IF SPECIATION AND SENSITIVITIES ARE REQUIRED. Performed at [facility], [address] [place], [place] [address]    Report  Status 08/19/2023 FINAL  Final  Blood Culture ID Panel (Reflexed)     Status: Abnormal   Collection Time: 08/16/23  2:05 AM  Result Value Ref Range Status   Enterococcus faecalis NOT DETECTED NOT DETECTED Final   Enterococcus Faecium NOT DETECTED NOT DETECTED Final   Listeria monocytogenes NOT DETECTED NOT DETECTED Final   Staphylococcus species DETECTED (A) NOT DETECTED Final    Comment: CRITICAL RESULT CALLED TO, READ BACK BY AND VERIFIED WITH: PHARMD J. LEDFORD 08/16/2023 @ 2301 BY AB    Staphylococcus aureus (BCID) NOT DETECTED NOT DETECTED Final   Staphylococcus epidermidis DETECTED (A) NOT DETECTED Final    Comment: Methicillin (oxacillin) resistant coagulase negative staphylococcus. Possible blood culture contaminant (unless isolated from more than one blood culture draw or clinical case suggests pathogenicity). No antibiotic treatment is indicated for blood  culture contaminants. CRITICAL RESULT CALLED TO, READ BACK BY AND VERIFIED WITH: PHARMD J. LEDFORD 08/16/2023 @ 2301 BY AB    Staphylococcus lugdunensis NOT DETECTED NOT DETECTED Final   Streptococcus species NOT DETECTED NOT DETECTED Final   Streptococcus agalactiae NOT DETECTED NOT DETECTED Final   Streptococcus pneumoniae NOT DETECTED NOT DETECTED Final   Streptococcus pyogenes NOT DETECTED NOT DETECTED Final   A.calcoaceticus-baumannii NOT DETECTED NOT DETECTED Final   Bacteroides fragilis NOT DETECTED NOT DETECTED Final   Enterobacterales NOT DETECTED NOT DETECTED Final   Enterobacter cloacae complex NOT DETECTED NOT DETECTED Final   Escherichia coli NOT DETECTED NOT DETECTED Final   Klebsiella aerogenes NOT DETECTED NOT DETECTED Final   Klebsiella oxytoca NOT DETECTED NOT DETECTED Final   Klebsiella pneumoniae NOT DETECTED NOT DETECTED Final   Proteus species NOT DETECTED NOT DETECTED Final   Salmonella species NOT DETECTED NOT DETECTED Final   Serratia marcescens NOT DETECTED NOT DETECTED Final   Haemophilus  influenzae NOT DETECTED NOT DETECTED Final   Neisseria meningitidis NOT DETECTED NOT DETECTED Final   Pseudomonas aeruginosa NOT DETECTED NOT DETECTED Final   Stenotrophomonas maltophilia NOT DETECTED NOT DETECTED Final   Candida albicans NOT DETECTED NOT DETECTED Final   Candida auris NOT DETECTED NOT DETECTED Final   Candida glabrata NOT DETECTED NOT DETECTED Final   Candida krusei NOT DETECTED NOT DETECTED Final   Candida parapsilosis NOT DETECTED NOT DETECTED Final   Candida tropicalis NOT DETECTED NOT DETECTED Final   Cryptococcus neoformans/gattii NOT DETECTED NOT DETECTED Final   Methicillin resistance mecA/C DETECTED (A) NOT DETECTED Final    Comment: CRITICAL RESULT CALLED TO, READ BACK BY AND VERIFIED WITH: PHARMD J. LEDFORD 08/16/2023 @ 2301 BY AB Performed at [facility], [address], [place], [place] [address]   Blood Culture (routine x 2)     Status: None   Collection Time: 08/16/23  2:10 AM   Specimen: BLOOD  Result Value Ref Range Status   Specimen Description BLOOD BLOOD RIGHT FOREARM  Final   Special Requests   Final    BOTTLES DRAWN AEROBIC AND ANAEROBIC Blood Culture adequate volume   Culture   Final    NO GROWTH 5 DAYS Performed at  [facility], [address], [place], [place] [address]    Report Status 08/21/2023 FINAL  Final  MRSA Next Gen by PCR, Nasal     Status: None   Collection Time: 08/16/23  5:23 AM   Specimen: Nasal Mucosa; Nasal Swab  Result Value Ref Range Status   MRSA by PCR Next Gen NOT DETECTED NOT DETECTED Final    Comment: (NOTE) The GeneXpert MRSA Assay (FDA approved for NASAL specimens only), is one component of a comprehensive MRSA colonization surveillance program. It is not intended to diagnose MRSA infection nor to guide or monitor treatment for MRSA infections. Test performance is not FDA approved in patients less than 70 years old. Performed at [facility], [address], [place], [place] [address]   Culture,  Respiratory w Gram Stain     Status: None   Collection Time: 08/16/23  6:27 AM   Specimen: Tracheal Aspirate; Respiratory  Result Value Ref Range Status   Specimen Description TRACHEAL ASPIRATE  Final   Special Requests NONE  Final   Gram Stain   Final    ABUNDANT WBC PRESENT, PREDOMINANTLY PMN FEW GRAM POSITIVE COCCI IN PAIRS IN CLUSTERS Performed at [facility], [address] [place], [place] [address]    Culture FEW STAPHYLOCOCCUS AUREUS  Final   Report Status 08/19/2023 FINAL  Final   Organism ID, Bacteria STAPHYLOCOCCUS AUREUS  Final      Susceptibility   Staphylococcus aureus - MIC*    CIPROFLOXACIN <=0.5 SENSITIVE Sensitive     ERYTHROMYCIN <=0.25 SENSITIVE Sensitive     GENTAMICIN <=0.5 SENSITIVE Sensitive     OXACILLIN 0.5 SENSITIVE Sensitive     TETRACYCLINE <=1 SENSITIVE Sensitive     VANCOMYCIN 1 SENSITIVE Sensitive     TRIMETH/SULFA <=10 SENSITIVE Sensitive     CLINDAMYCIN <=0.25 SENSITIVE Sensitive     RIFAMPIN <=0.5 SENSITIVE Sensitive     Inducible Clindamycin NEGATIVE Sensitive     LINEZOLID 2 SENSITIVE Sensitive     * FEW STAPHYLOCOCCUS AUREUS  Urine Culture (for pregnant, neutropenic or urologic patients or patients with an indwelling urinary catheter)     Status: None   Collection Time: 08/16/23  7:54 AM   Specimen: Urine, Clean Catch  Result Value Ref Range Status   Specimen Description URINE, CLEAN CATCH  Final   Special Requests NONE  Final   Culture   Final    NO GROWTH Performed at [facility], [address] [place], [place] [address]    Report Status 08/17/2023 FINAL  Final     Radiology Studies: No results found.  Scheduled Meds:  amLODipine  10 mg Per Tube Daily   aspirin  81 mg Per Tube Daily   atorvastatin  40 mg Per Tube Daily   Chlorhexidine Gluconate Cloth  6 each Topical Daily   clopidogrel  75 mg Per Tube Daily   vitamin B-12  1,000 mcg Per Tube Daily   free water  150 mL Per Tube Q4H   Gerhardt's butt cream    Topical BID   heparin  5,000 Units Subcutaneous Q8H   insulin aspart  0-20 Units Subcutaneous Q4H   leptospermum manuka honey  1 Application Topical Daily   pantoprazole (PROTONIX) IV  40 mg Intravenous QHS   sertraline  50 mg Per Tube Daily   thiamine  100 mg Per Tube Daily   valproic acid  500 mg Per Tube QHS   And   valproic acid  250 mg Per Tube q morning   Continuous Infusions:   ceFAZolin (ANCEF) IV 2 g (08/21/23 0501)   feeding supplement (VITAL AF 1.2 CAL) 55 mL/hr at 08/20/23 0250     LOS: 5 days   Time spent: 40 minutes  Carollee Herter, DO  [facility]  08/21/2023, 2:34 PM

## 2023-08-21 NOTE — Assessment & Plan Note (Signed)
Since admission to 08-20-2023 improved, off pressors.  - CAP, MSSA on trach aspirate. Extubated 12/1 - MSSA on blood cx likely contaminate - Urine cx negative - continue Ancef 7 day course to tx MSSA PNA -cont scheduled duonebs  08-22-2023 resolved by the time she was transferred out of ICU on 08-20-2023

## 2023-08-21 NOTE — Assessment & Plan Note (Addendum)
Since admission to 08-20-2023 improved, off pressors.  - CAP, MSSA on trach aspirate. Extubated 12/1 - MSSA on blood cx likely contaminate - Urine cx negative - continue Ancef 7 day course to tx MSSA PNA -cont scheduled duonebs 08-21-2023 continue with IV Ancef. Today is Day #3 of 7 08-22-2023 discussed with pharmacy. Pt had been on IV rocephin from 11-29 through 12-2.  Will count those days of IV rocephin as therapy towards her treatment of MSSA pneumonia. Will continue IV Ancef for a total of 5 days. Today is Day #4 of 5. 08-23-2023 will complete IV ancef today Today Day #5 of 5. Resolved.

## 2023-08-21 NOTE — Assessment & Plan Note (Addendum)
08-21-2023 see RD note 08-21-2023. Moderate Malnutrition related to chronic illness as evidenced by mild fat depletion, severe muscle depletion, moderate muscle depletion.   08-22-2023 will stop cortrak. Start calorie counts. Will monitor over weekend to see how much liquids and solids she takes in.  08-23-2023 continue with calorie counts this weekend. Awaiting palliative care consult for GOC discussion in case pt does not take enough liquids/food to sustain herself. 08-24-2023 awaiting calorie counts by RD.  GOC discussion with palliative care today.  08-25-2023 discussed with palliative care. Per my discussion with palliative care, pt's husband would never want pt to have PEG.

## 2023-08-21 NOTE — Assessment & Plan Note (Addendum)
08-21-2023 Hg 8.1 g/dl today.   Iron studies show iron deficiency. On tube feeds that have iron. Iron/TIBC/Ferritin/ %Sat    Component Value Date/Time   IRON 20 (L) 08/16/2023 0656   TIBC 204 (L) 08/16/2023 0656   FERRITIN 388 (H) 08/16/2023 0656   IRONPCTSAT 10 (L) 08/16/2023 8657   08-23-2023 will start po nu-iron 150 mg daily

## 2023-08-21 NOTE — Progress Notes (Signed)
Nutrition Follow-up  DOCUMENTATION CODES:   Non-severe (moderate) malnutrition in context of chronic illness  INTERVENTION:  Continue TF via Cortrak: Vital AF 1.2 at 4ml/hr  Free water flushes q4h Provides 1584 kcal, 99g protein and total free water daily (TF + flushes)  Diet order per SLP; plans to downgrade to dysphagia 1  Discussed with DO ongoing hyperglycemia and recommendation for DM coordinator evaluation to assess insulin regimen  NUTRITION DIAGNOSIS:   Moderate Malnutrition related to chronic illness as evidenced by mild fat depletion, severe muscle depletion, moderate muscle depletion. - remains applicable  GOAL:   Patient will meet greater than or equal to 90% of their needs - goal met; TF at goal  MONITOR:   TF tolerance, I & O's, Skin, Labs, Weight trends  REASON FOR ASSESSMENT:   Ventilator    ASSESSMENT:   57 yo admitted with septic shock secondary to RLL pneumonia, probable UTI, hypernatremia, shock liver, intubated. PMH includes HTN, HLD, CVA, epilepsy, depression, DM. Pt resides at SNF x 5 years, wheelchair bound  11/29 - presented to [facility] ED with AMS, intubated, transferred to [facility] 12/1 - extubated 12/2 - cortrak placed, imaging pending, SLP evaluation, NPO  12/3 - s/p FEES, diet upgraded to dysphagia 2, thin liquids 12/4 - diet downgraded to dysphagia 1  Flowsheet documentation reflects pt follows commands, though is non-verbal. S/p EEG c/w moderate diffuse encephalopathy, without seizure activity.  Reached out to RN to inquire about pt's PO intake today. Pt was sleepy this morning therefore did not consume any breakfast. Pt now awake and plans to try to assist with lunch meal soon. RN reports concern of observed inability to chew/swallow some food items on trays. SLP planning to follow up today with pt.    Meal completions: 12/3: 30% lunch, 30% dinner (per discussion with RN) 12/4: 0% breakfast  TF infusing at goal rate. Per  RN, tolerating without n/v or abdominal pain/distension. CBG's continue to remain elevated. Reached out to MD regarding insulin regimen.   Given limited PO intake, would recommend remaining on continuous TF via Cortrak for now. Will monitor pt's PO nutritional adequacy and adjust TF as appropriate.   Admit weight: 68 kg Current weight: 76.3 kg  Medications: Vitamin B12, SSI 0-20 units q4h, IV protonix, thiamine (started 11/29)  Labs: CBG's 187-219 x24 hours  Diet Order:   Diet Order             DIET - DYS 1 Fluid consistency: Thin  Diet effective now                   EDUCATION NEEDS:   Not appropriate for education at this time  Skin:  Skin Assessment: Skin Integrity Issues: Skin Integrity Issues:: Stage III DTI: bilateral heels Stage III: R buttock  Last BM:  12/2  Height:   Ht Readings from Last 1 Encounters:  08/16/23 5\' 2"  (1.575 m)    Weight:   Wt Readings from Last 1 Encounters:  08/21/23 76.3 kg    Ideal Body Weight:  50 kg  BMI:  Body mass index is 30.77 kg/m.  Estimated Nutritional Needs:   Kcal:  1600-1800 kcals  Protein:  90-110 g  Fluid:  >/= 1.8 L  Drusilla Kanner, RDN, LDN Clinical Nutrition

## 2023-08-21 NOTE — Assessment & Plan Note (Signed)
08-21-2023 present on admission.

## 2023-08-21 NOTE — Assessment & Plan Note (Addendum)
08-21-2023 Pt with a history of dyspahgia after CVA in 2021, last clinical evaluation recommended dys 2/nectar.  Seen by ST.  Currently has cortrak.  08-22-2023 Discussed with ST and RD. Discussed with husband. Pt is safe to eat with dyphagia diet and nectar thick liquids. Will pull cortrak and start calorie counts.   Will see this weekend if pt can maintain hydration and enough po intake.   Discussed with husband that every time pt gets admitted to hospital, she gets weaker and weaker. She nearly died this admission due to septic shock. Discussed with husband that keeping pt at same level of function should be expectation rather than pt will improve.   Discussed PEG tube and whether this would be consider by some people as artificial nutrition/life support.   Husband states they have been married for 37 years.   Discussed with him that if pt unable to take enough liquids and solid to stay hydrate and nourished, he will need to consider PEG tube.  This would not prevent aspiration pneumonia and may prolong her suffering. I asked pt to think about pt's life and their wishes/dreams as they got older.  Will enlist palliative care to help husband with goals of care. 08-23-2023 cortrak removed. Calorie counts started yesterday. Will see how much she can eat and drink this weekend. Awaiting palliative care consult. 08-24-2023 calorie counts to finish today. Pt eating enough in my estimation but will see what the expert dietician says.  08-25-2023 awaiting calorie counts by RD. Restart dysphagia-1 diet with nectar thick liquids.  08-26-2023 ST working with pt again today. Remains on dysphagia-1 diet(pureed) with nectar thick liquids. Family has decided AGAINST PEG tube. I agree with this decision.

## 2023-08-21 NOTE — Hospital Course (Signed)
HPI: 57 year old woman who presented to [facility] 11/29 as a transfer from [facility] for AMS, suspected urosepsis. PMHx significant for HTN, HLD, CVA (moderate-large R MCA infarct), T2DM, epilepsy, depression.    Patient is a resident of the [facility]) and had decreased responsiveness throughout the course of the day. EMS noted GCS 6, fever and tachycardia. Labs were notable for WBC 13.4, Hgb 9.8, Plt 286. INR 1.1. Na 156, K 3.9, CO2 27, Cr 0.99 (baseline 0.8-1), mildly elevated transaminases 78/74. LA 1.5 > 4.0. ABG 7.42/46/100/29.2. COVID/Flu/RSV negative. UA +protein, nitrites, trace leuks. CXR without active disease. 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 Patient was intubated at [facility] for airway protection.   PCCM consulted for ICU admission and transfer.  Significant Events: Admitted 08/16/2023 for septic shock RLL PNA, UTI, acute respiratory failure with hypoxia Intubated in ER on 08-16-2023 08-21-2023 Care transferred to Hospitalist service   Significant Labs: Admission WBC 13.4, HgB 9.8, Na 156, BUN 45, Scr 0.99  Significant Imaging Studies: Admission CXR Support apparatus in appropriate position. 2. No active disease.3. Possible mild thoracic aortic aneurysm. This could be confirmed with dedicated CT imaging. Admission CT chest/abd/pelvis consolidative opacity right lower lobe greater than middle and upper lobe. Pneumonias in the differential. Recommend follow-up. No bowel obstruction, free air. Minimal areas of fluid and stranding. ET tube and enteric tube. 08-19-2023 EEG suggestive of moderate diffuse encephalopathy. No seizures or epileptiform discharges were seen throughout the recording.   Antibiotic Therapy: Anti-infectives (From admission, onward)    Start     Dose/Rate Route Frequency Ordered Stop   08/19/23 1400  ceFAZolin (ANCEF) IVPB 2g/100 mL premix        2  g 200 mL/hr over 30 Minutes Intravenous Every 8 hours 08/19/23 0915 08/23/23 0559   08/17/23 0800  vancomycin (VANCOREADY) IVPB 1250 mg/250 mL  Status:  Discontinued        1,250 mg 166.7 mL/hr over 90 Minutes Intravenous Every 24 hours 08/16/23 1908 08/19/23 0915   08/16/23 2200  cefTRIAXone (ROCEPHIN) 2 g in sodium chloride 0.9 % 100 mL IVPB  Status:  Discontinued        2 g 200 mL/hr over 30 Minutes Intravenous Daily at bedtime 08/16/23 0632 08/19/23 0915   08/16/23 2200  vancomycin (VANCOREADY) IVPB 750 mg/150 mL  Status:  Discontinued        750 mg 150 mL/hr over 60 Minutes Intravenous Every 12 hours 08/16/23 0634 08/16/23 0928   08/16/23 0730  vancomycin (VANCOREADY) IVPB 1250 mg/250 mL        1,250 mg 166.7 mL/hr over 90 Minutes Intravenous  Once 08/16/23 0630 08/16/23 0939   08/16/23 0715  azithromycin (ZITHROMAX) 500 mg in sodium chloride 0.9 % 250 mL IVPB  Status:  Discontinued        500 mg 250 mL/hr over 60 Minutes Intravenous Every 24 hours 08/16/23 0628 08/16/23 0629   08/16/23 0645  azithromycin (ZITHROMAX) 500 mg in sodium chloride 0.9 % 250 mL IVPB        500 mg 250 mL/hr over 60 Minutes Intravenous Daily 08/16/23 0629 08/18/23 1148   08/16/23 0215  cefTRIAXone (ROCEPHIN) 2 g in sodium chloride 0.9 % 100 mL IVPB        2 g 200 mL/hr over 30 Minutes Intravenous  Once 08/16/23 0206 08/16/23 0246       Procedures: 08-16-2023 endotracheal intubation 08-18-2023 extubation 08-19-2023 EEG suggestive of moderate  diffuse encephalopathy. No seizures or epileptiform discharges were seen throughout the recording.  08-19-2023 Cortrak placement  Consultants: PCCM

## 2023-08-21 NOTE — Assessment & Plan Note (Signed)
08-21-2023 chronic.

## 2023-08-21 NOTE — Subjective & Objective (Addendum)
Pt seen and examined. Stable. Palliative care met with pt's family. Pt has been made DNR/DNI.

## 2023-08-22 DIAGNOSIS — A419 Sepsis, unspecified organism: Secondary | ICD-10-CM | POA: Diagnosis not present

## 2023-08-22 DIAGNOSIS — I69391 Dysphagia following cerebral infarction: Secondary | ICD-10-CM | POA: Diagnosis not present

## 2023-08-22 DIAGNOSIS — G40909 Epilepsy, unspecified, not intractable, without status epilepticus: Secondary | ICD-10-CM | POA: Diagnosis not present

## 2023-08-22 DIAGNOSIS — E663 Overweight: Secondary | ICD-10-CM | POA: Insufficient documentation

## 2023-08-22 DIAGNOSIS — J15211 Pneumonia due to Methicillin susceptible Staphylococcus aureus: Secondary | ICD-10-CM | POA: Diagnosis not present

## 2023-08-22 LAB — GLUCOSE, CAPILLARY
Glucose-Capillary: 146 mg/dL — ABNORMAL HIGH (ref 70–99)
Glucose-Capillary: 120 mg/dL — ABNORMAL HIGH (ref 70–99)
Glucose-Capillary: 245 mg/dL — ABNORMAL HIGH (ref 70–99)
Glucose-Capillary: 220 mg/dL — ABNORMAL HIGH (ref 70–99)

## 2023-08-22 MED ORDER — ENSURE ENLIVE PO LIQD
237.0000 mL | Freq: Two times a day (BID) | ORAL | Status: DC
  Administered 2023-08-22 – 2023-08-24 (×5): 237 mL via ORAL

## 2023-08-22 MED ORDER — INSULIN ASPART 100 UNIT/ML IJ SOLN
0.0000 [IU] | Freq: Three times a day (TID) | INTRAMUSCULAR | Status: DC
  Administered 2023-08-22 – 2023-08-23 (×2): 7 [IU] via SUBCUTANEOUS
  Administered 2023-08-23 (×2): 4 [IU] via SUBCUTANEOUS
  Administered 2023-08-23: 11 [IU] via SUBCUTANEOUS
  Administered 2023-08-24 (×2): 4 [IU] via SUBCUTANEOUS
  Administered 2023-08-24 (×2): 7 [IU] via SUBCUTANEOUS
  Administered 2023-08-25: 4 [IU] via SUBCUTANEOUS
  Administered 2023-08-25: 3 [IU] via SUBCUTANEOUS
  Administered 2023-08-25: 4 [IU] via SUBCUTANEOUS
  Administered 2023-08-26: 7 [IU] via SUBCUTANEOUS
  Administered 2023-08-26: 3 [IU] via SUBCUTANEOUS

## 2023-08-22 MED ORDER — IVABRADINE 2.5 MG HALF TABLET
2.5000 mg | ORAL_TABLET | Freq: Two times a day (BID) | ORAL | Status: DC
  Administered 2023-08-22 (×2): 2.5 mg via ORAL
  Filled 2023-08-22 (×4): qty 1

## 2023-08-22 NOTE — Plan of Care (Signed)

## 2023-08-22 NOTE — Progress Notes (Addendum)
PROGRESS NOTE    [name]  ZDG:[id number] DOB: August 15, 1966 DOA: 08/16/2023 PCP: [facility]  Subjective: Pt seen and examined. Pt remains nonverbal.  On RA. Has cortrak in her nose.  Met at bedside with pt's husband [name]. Pt has been living at [facility] for 5 years now. Pt has not walked in 5 years. Is non-verbal.  Pt is more awake.  Discussed with ST and RD. Discussed with husband. Pt is safe to eat with dyphagia diet and nectar thick liquids. Will pull cortrak and start calorie counts.  Will see this weekend if pt can maintain hydration and enough po intake.  Discussed with husband that every time pt gets admitted to hospital, she gets weaker and weaker. She nearly died this admission due to septic shock. Discussed with husband that keeping pt at same level of function should be expectation rather than pt will improve.  Discussed PEG tube and whether this would be consider by some people as artificial nutrition/life support.  Husband states they have been married for 37 years.  Discussed with him that if pt unable to take enough liquids and solid to stay hydrate and nourished, he will need to consider PEG tube.  This would not prevent aspiration pneumonia and may prolong her suffering. I asked pt to think about pt's life and their wishes/dreams as they got older.  Will enlist palliative care to help husband with goals of care.   Hospital Course: HPI: 57 year old woman who presented to [facility] 11/29 as a transfer from [facility] for AMS, suspected urosepsis. PMHx significant for HTN, HLD, CVA (moderate-large R MCA infarct), T2DM, epilepsy, depression.    Patient is a resident of the [facility] [address]) and had decreased responsiveness throughout the course of the day. EMS noted GCS 6, fever and tachycardia. Labs were notable for WBC 13.4, Hgb 9.8, Plt 286. INR 1.1. Na 156, K 3.9, CO2 27, Cr 0.99 (baseline 0.8-1), mildly elevated transaminases 78/74. LA 1.5  > 4.0. ABG 7.42/46/100/29.2. COVID/Flu/RSV negative. UA +protein, nitrites, trace leuks. CXR without active disease. 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 Patient was intubated at [facility] for airway protection.   PCCM consulted for ICU admission and transfer.  Significant Events: Admitted 08/16/2023 for septic shock RLL PNA, UTI, acute respiratory failure with hypoxia Intubated in ER on 08-16-2023 08-21-2023 Care transferred to Hospitalist service   Significant Labs: Admission WBC 13.4, HgB 9.8, Na 156, BUN 45, Scr 0.99  Significant Imaging Studies: Admission CXR Support apparatus in appropriate position. 2. No active disease.3. Possible mild thoracic aortic aneurysm. This could be confirmed with dedicated CT imaging. Admission CT chest/abd/pelvis consolidative opacity right lower lobe greater than middle and upper lobe. Pneumonias in the differential. Recommend follow-up. No bowel obstruction, free air. Minimal areas of fluid and stranding. ET tube and enteric tube. 08-19-2023 EEG suggestive of moderate diffuse encephalopathy. No seizures or epileptiform discharges were seen throughout the recording.   Antibiotic Therapy: Anti-infectives (From admission, onward)    Start     Dose/Rate Route Frequency Ordered Stop   08/19/23 1400  ceFAZolin (ANCEF) IVPB 2g/100 mL premix        2 g 200 mL/hr over 30 Minutes Intravenous Every 8 hours 08/19/23 0915 08/23/23 0559   08/17/23 0800  vancomycin (VANCOREADY) IVPB 1250 mg/250 mL  Status:  Discontinued        1,250 mg 166.7 mL/hr over 90 Minutes Intravenous Every 24  hours 08/16/23 1908 08/19/23 0915   08/16/23 2200  cefTRIAXone (ROCEPHIN) 2 g in sodium chloride 0.9 % 100 mL IVPB  Status:  Discontinued        2 g 200 mL/hr over 30 Minutes Intravenous Daily at bedtime 08/16/23 0632 08/19/23 0915   08/16/23 2200  vancomycin (VANCOREADY) IVPB  750 mg/150 mL  Status:  Discontinued        750 mg 150 mL/hr over 60 Minutes Intravenous Every 12 hours 08/16/23 0634 08/16/23 0928   08/16/23 0730  vancomycin (VANCOREADY) IVPB 1250 mg/250 mL        1,250 mg 166.7 mL/hr over 90 Minutes Intravenous  Once 08/16/23 0630 08/16/23 0939   08/16/23 0715  azithromycin (ZITHROMAX) 500 mg in sodium chloride 0.9 % 250 mL IVPB  Status:  Discontinued        500 mg 250 mL/hr over 60 Minutes Intravenous Every 24 hours 08/16/23 0628 08/16/23 0629   08/16/23 0645  azithromycin (ZITHROMAX) 500 mg in sodium chloride 0.9 % 250 mL IVPB        500 mg 250 mL/hr over 60 Minutes Intravenous Daily 08/16/23 0629 08/18/23 1148   08/16/23 0215  cefTRIAXone (ROCEPHIN) 2 g in sodium chloride 0.9 % 100 mL IVPB        2 g 200 mL/hr over 30 Minutes Intravenous  Once 08/16/23 0206 08/16/23 0246       Procedures: 08-16-2023 endotracheal intubation 08-18-2023 extubation 08-19-2023 EEG suggestive of moderate diffuse encephalopathy. No seizures or epileptiform discharges were seen throughout the recording.  08-19-2023 Cortrak placement  Consultants: PCCM    Assessment and Plan: * Septic shock (HCC) Since admission to 08-20-2023 improved, off pressors.  - CAP, MSSA on trach aspirate. Extubated 12/1 - MSSA on blood cx likely contaminate - Urine cx negative - continue Ancef 7 day course to tx MSSA PNA -cont scheduled duonebs  08-22-2023 resolved by the time she was transferred out of ICU on 08-20-2023  Dysphagia as late effect of cerebrovascular accident (CVA) 08-21-2023 Pt with a history of dyspahgia after CVA in 2021, last clinical evaluation recommended dys 2/nectar.  Seen by ST.  Currently has cortrak.  08-22-2023 Discussed with ST and RD. Discussed with husband. Pt is safe to eat with dyphagia diet and nectar thick liquids. Will pull cortrak and start calorie counts.   Will see this weekend if pt can maintain hydration and enough po intake.   Discussed with  husband that every time pt gets admitted to hospital, she gets weaker and weaker. She nearly died this admission due to septic shock. Discussed with husband that keeping pt at same level of function should be expectation rather than pt will improve.   Discussed PEG tube and whether this would be consider by some people as artificial nutrition/life support.   Husband states they have been married for 37 years.   Discussed with him that if pt unable to take enough liquids and solid to stay hydrate and nourished, he will need to consider PEG tube.  This would not prevent aspiration pneumonia and may prolong her suffering. I asked pt to think about pt's life and their wishes/dreams as they got older.  Will enlist palliative care to help husband with goals of care.  Pneumonia of both lower lobes due to methicillin susceptible Staphylococcus aureus (MSSA) (HCC) Since admission to 08-20-2023 improved, off pressors.  - CAP, MSSA on trach aspirate. Extubated 12/1 - MSSA on blood cx likely contaminate - Urine cx negative - continue Ancef  7 day course to tx MSSA PNA -cont scheduled duonebs 08-21-2023 continue with IV Ancef. Today is Day #3 of 7 08-22-2023 discussed with pharmacy. Pt had been on IV rocephin from 11-29 through 12-2.  Will count those days of IV rocephin as therapy towards her treatment of MSSA pneumonia. Will continue IV Ancef for a total of 5 days. Today is Day #4 of 5.  Weakness of left side of body 08-21-2023 chronic.  History of cardioembolic cerebrovascular accident (CVA) 08-21-2023 chronic.  Overweight (BMI 25.0-29.9) 08-22-2023 BMI 29.81  Pressure injury of skin  - bilateral heels, bilateral buttocks 08-21-2023 present on admission.  Altered mental status 08-21-2023 unclear what pt's baseline is. Pt lives in SNF prior to admission.  Malnutrition of moderate degree 08-21-2023 see RD note 08-21-2023. Moderate Malnutrition related to chronic illness as evidenced by mild fat  depletion, severe muscle depletion, moderate muscle depletion.   08-22-2023 will stop cortrak. Start calorie counts. Will monitor over weekend to see how much liquids and solids she takes in.  Iron deficiency anemia 08-21-2023 Hg 8.1 g/dl today.   Iron studies show iron deficiency. On tube feeds that have iron. Iron/TIBC/Ferritin/ %Sat    Component Value Date/Time   IRON 20 (L) 08/16/2023 0656   TIBC 204 (L) 08/16/2023 0656   FERRITIN 388 (H) 08/16/2023 0656   IRONPCTSAT 10 (L) 08/16/2023 0656     Epilepsy (HCC) 08-21-2023 continue with depakene   DVT prophylaxis: heparin injection 5,000 Units Start: 08/16/23 1400 SCDs Start: 08/16/23 0435    Code Status: Full Code Family Communication: discussed at bedside with husband [name]. Disposition Plan: return to SNF Reason for continuing need for hospitalization: remains on IV abx. Starting calorie counts today. Will remove cortrak today.  Objective: Vitals:   08/22/23 0426 08/22/23 0707 08/22/23 0714 08/22/23 1515  BP: (!) 107/53  (!) 139/113 110/71  Pulse: (!) 108  (!) 105 (!) 104  Resp: 18  16 16   Temp: 98.1 F (36.7 C)  97.7 F (36.5 C) 97.9 F (36.6 C)  TempSrc: Oral  Oral   SpO2: 100%  99% 100%  Weight:  73.9 kg    Height:        Intake/Output Summary (Last 24 hours) at 08/22/2023 1650 Last data filed at 08/22/2023 1501 Gross per 24 hour  Intake 6522.39 ml  Output 1050 ml  Net 5472.39 ml   Filed Weights   08/20/23 0336 08/21/23 0500 08/22/23 0707  Weight: 75.6 kg 76.3 kg 73.9 kg    Examination:  Physical Exam Vitals and nursing note reviewed.  Constitutional:      Comments: Awake. Non-verbal  HENT:     Head: Normocephalic and atraumatic.     Nose: Nose normal.     Comments: +cortrak Cardiovascular:     Rate and Rhythm: Normal rate.  Pulmonary:     Effort: Pulmonary effort is normal.     Breath sounds: Normal breath sounds.  Abdominal:     General: Bowel sounds are normal.     Palpations: Abdomen is  soft.  Skin:    General: Skin is warm and dry.     Capillary Refill: Capillary refill takes less than 2 seconds.  Neurological:     Comments: Non-verbal Awake.     Data Reviewed: I have personally reviewed following labs and imaging studies  CBC: Recent Labs  Lab 08/16/23 0205 08/17/23 0513 08/17/23 0819 08/17/23 1531 08/18/23 0221 08/19/23 1117 08/20/23 0821 08/21/23 0729  WBC 13.4*   < > 13.3*  --  11.5* 11.1* 6.8 7.0  NEUTROABS 8.8*  --   --   --   --   --   --   --   HGB 9.8*   < > 6.8* 8.3* 7.4* 8.3* 9.8* 8.1*  HCT 31.1*   < > 22.3* 27.6* 22.6* 25.2* 30.2* 24.9*  MCV 73.0*   < > 72.2*  --  73.4* 70.8* 72.1* 71.6*  PLT 286   < > 182  --  169 183 212 214   < > = values in this interval not displayed.   Basic Metabolic Panel: Recent Labs  Lab 08/17/23 0513 08/17/23 1712 08/17/23 2008 08/18/23 0221 08/18/23 1807 08/19/23 0629 08/20/23 0554 08/21/23 0729  NA 150*  --   --  146*  --  148* 139 138  K 3.2*  --   --  3.3*  --  4.6 3.4* 3.8  CL 116*  --   --  111  --  114* 105 106  CO2 26  --   --  27  --  24 25 27   GLUCOSE 198*  --   --  157*  --  101* 160* 215*  BUN 24*  --   --  17  --  13 8 9   CREATININE 0.67  --   --  0.51  --  0.40* 0.49 0.60  CALCIUM 8.2*  --   --  7.9*  --  8.3* 8.3* 7.9*  MG 1.4* 2.3  --  2.2 2.1 2.0 1.6*  --   PHOS 1.6* 4.5 4.4 3.7 2.8 2.4* 2.1*  --    GFR: Estimated Creatinine Clearance: 72.1 mL/min (by C-G formula based on SCr of 0.6 mg/dL). Liver Function Tests: Recent Labs  Lab 08/16/23 0205 08/16/23 0656 08/17/23 0513 08/21/23 0729  AST 78* 57* 24 42*  ALT 74* 60* 35 47*  ALKPHOS 40 40 34* 47  BILITOT 0.8 0.6 0.3 0.2  PROT 6.6 5.7* 4.7* 4.8*  ALBUMIN 2.9* 2.5* 2.0* 2.0*   Coagulation Profile: Recent Labs  Lab 08/16/23 0205  INR 1.1   CBG: Recent Labs  Lab 08/21/23 2053 08/21/23 2339 08/22/23 0423 08/22/23 0712 08/22/23 1137  GLUCAP 219* 223* 146* 120* 245*   Sepsis Labs: Recent Labs  Lab 08/16/23 0340  08/16/23 0656 08/16/23 0947 08/17/23 1119  PROCALCITON  --  0.17  --   --   LATICACIDVEN 4.0* 5.3* 5.0* 3.5*    Recent Results (from the past 240 hour(s))  Resp panel by RT-PCR (RSV, Flu A&B, Covid) Anterior Nasal Swab     Status: None   Collection Time: 08/16/23  2:05 AM   Specimen: Anterior Nasal Swab  Result Value Ref Range Status   SARS Coronavirus 2 by RT PCR NEGATIVE NEGATIVE Final    Comment: (NOTE) SARS-CoV-2 target nucleic acids are NOT DETECTED.  The SARS-CoV-2 RNA is generally detectable in upper respiratory specimens during the acute phase of infection. The lowest concentration of SARS-CoV-2 viral copies this assay can detect is 138 copies/mL. A negative result does not preclude SARS-Cov-2 infection and should not be used as the sole basis for treatment or other patient management decisions. A negative result may occur with  improper specimen collection/handling, submission of specimen other than nasopharyngeal swab, presence of viral mutation(s) within the areas targeted by this assay, and inadequate number of viral copies(<138 copies/mL). A negative result must be combined with clinical observations, patient history, and epidemiological information. The expected result is Negative.  Fact Sheet for Patients:  BloggerCourse.com  Fact Sheet for Healthcare Providers:  SeriousBroker.it  This test is no t yet approved or cleared by the [place] FDA and  has been authorized for detection and/or diagnosis of SARS-CoV-2 by FDA under an Emergency Use Authorization (EUA). This EUA will remain  in effect (meaning this test can be used) for the duration of the COVID-19 declaration under Section 564(b)(1) of the Act, 21 U.S.C.section 360bbb-3(b)(1), unless the authorization is terminated  or revoked sooner.       Influenza A by PCR NEGATIVE NEGATIVE Final   Influenza B by PCR NEGATIVE NEGATIVE Final    Comment:  (NOTE) The Xpert Xpress SARS-CoV-2/FLU/RSV plus assay is intended as an aid in the diagnosis of influenza from Nasopharyngeal swab specimens and should not be used as a sole basis for treatment. Nasal washings and aspirates are unacceptable for Xpert Xpress SARS-CoV-2/FLU/RSV testing.  Fact Sheet for Patients: BloggerCourse.com  Fact Sheet for Healthcare Providers: SeriousBroker.it  This test is not yet approved or cleared by the [place] FDA and has been authorized for detection and/or diagnosis of SARS-CoV-2 by FDA under an Emergency Use Authorization (EUA). This EUA will remain in effect (meaning this test can be used) for the duration of the COVID-19 declaration under Section 564(b)(1) of the Act, 21 U.S.C. section 360bbb-3(b)(1), unless the authorization is terminated or revoked.     Resp Syncytial Virus by PCR NEGATIVE NEGATIVE Final    Comment: (NOTE) Fact Sheet for Patients: BloggerCourse.com  Fact Sheet for Healthcare Providers: SeriousBroker.it  This test is not yet approved or cleared by the [place] FDA and has been authorized for detection and/or diagnosis of SARS-CoV-2 by FDA under an Emergency Use Authorization (EUA). This EUA will remain in effect (meaning this test can be used) for the duration of the COVID-19 declaration under Section 564(b)(1) of the Act, 21 U.S.C. section 360bbb-3(b)(1), unless the authorization is terminated or revoked.  Performed at [facility], [address] [place], [place] [address]   Blood Culture (routine x 2)     Status: Abnormal   Collection Time: 08/16/23  2:05 AM   Specimen: BLOOD  Result Value Ref Range Status   Specimen Description   Final    BLOOD BLOOD LEFT HAND Performed at [facility], [address] [place], [place] [address]    Special Requests   Final    BOTTLES DRAWN AEROBIC AND ANAEROBIC Blood Culture  adequate volume Performed at [facility], [address], [place], [place] [address]    Culture  Setup Time   Final    GRAM POSITIVE COCCI Gram Stain Report Called to,Read Back By and Verified With: FLYNT,F ON 08/16/23 AT 1850 BY LOY,C AEROBIC AND ANAEROBIC BOTTLES CRITICAL RESULT CALLED TO, READ BACK BY AND VERIFIED WITH: PHARMD J. LEDFORD 08/16/2023 @ 2301 BY AB    Culture (A)  Final    STAPHYLOCOCCUS WARNERI STAPHYLOCOCCUS HOMINIS STAPHYLOCOCCUS EPIDERMIDIS THE SIGNIFICANCE OF ISOLATING THIS ORGANISM FROM A SINGLE SET OF BLOOD CULTURES WHEN MULTIPLE SETS ARE DRAWN IS UNCERTAIN. PLEASE NOTIFY THE MICROBIOLOGY DEPARTMENT WITHIN ONE WEEK IF SPECIATION AND SENSITIVITIES ARE REQUIRED. Performed at [facility], [address], [place], [place] [address]    Report Status 08/19/2023 FINAL  Final  Blood Culture ID Panel (Reflexed)     Status: Abnormal   Collection Time: 08/16/23  2:05 AM  Result Value Ref Range Status   Enterococcus faecalis NOT DETECTED NOT DETECTED Final   Enterococcus Faecium NOT DETECTED NOT DETECTED Final  Listeria monocytogenes NOT DETECTED NOT DETECTED Final   Staphylococcus species DETECTED (A) NOT DETECTED Final    Comment: CRITICAL RESULT CALLED TO, READ BACK BY AND VERIFIED WITH: PHARMD J. LEDFORD 08/16/2023 @ 2301 BY AB    Staphylococcus aureus (BCID) NOT DETECTED NOT DETECTED Final   Staphylococcus epidermidis DETECTED (A) NOT DETECTED Final    Comment: Methicillin (oxacillin) resistant coagulase negative staphylococcus. Possible blood culture contaminant (unless isolated from more than one blood culture draw or clinical case suggests pathogenicity). No antibiotic treatment is indicated for blood  culture contaminants. CRITICAL RESULT CALLED TO, READ BACK BY AND VERIFIED WITH: PHARMD J. LEDFORD 08/16/2023 @ 2301 BY AB    Staphylococcus lugdunensis NOT DETECTED NOT DETECTED Final   Streptococcus species NOT DETECTED NOT DETECTED Final    Streptococcus agalactiae NOT DETECTED NOT DETECTED Final   Streptococcus pneumoniae NOT DETECTED NOT DETECTED Final   Streptococcus pyogenes NOT DETECTED NOT DETECTED Final   A.calcoaceticus-baumannii NOT DETECTED NOT DETECTED Final   Bacteroides fragilis NOT DETECTED NOT DETECTED Final   Enterobacterales NOT DETECTED NOT DETECTED Final   Enterobacter cloacae complex NOT DETECTED NOT DETECTED Final   Escherichia coli NOT DETECTED NOT DETECTED Final   Klebsiella aerogenes NOT DETECTED NOT DETECTED Final   Klebsiella oxytoca NOT DETECTED NOT DETECTED Final   Klebsiella pneumoniae NOT DETECTED NOT DETECTED Final   Proteus species NOT DETECTED NOT DETECTED Final   Salmonella species NOT DETECTED NOT DETECTED Final   Serratia marcescens NOT DETECTED NOT DETECTED Final   Haemophilus influenzae NOT DETECTED NOT DETECTED Final   Neisseria meningitidis NOT DETECTED NOT DETECTED Final   Pseudomonas aeruginosa NOT DETECTED NOT DETECTED Final   Stenotrophomonas maltophilia NOT DETECTED NOT DETECTED Final   Candida albicans NOT DETECTED NOT DETECTED Final   Candida auris NOT DETECTED NOT DETECTED Final   Candida glabrata NOT DETECTED NOT DETECTED Final   Candida krusei NOT DETECTED NOT DETECTED Final   Candida parapsilosis NOT DETECTED NOT DETECTED Final   Candida tropicalis NOT DETECTED NOT DETECTED Final   Cryptococcus neoformans/gattii NOT DETECTED NOT DETECTED Final   Methicillin resistance mecA/C DETECTED (A) NOT DETECTED Final    Comment: CRITICAL RESULT CALLED TO, READ BACK BY AND VERIFIED WITH: PHARMD J. LEDFORD 08/16/2023 @ 2301 BY AB Performed at [facility], [address] [place], [place] [address]   Blood Culture (routine x 2)     Status: None   Collection Time: 08/16/23  2:10 AM   Specimen: BLOOD  Result Value Ref Range Status   Specimen Description BLOOD BLOOD RIGHT FOREARM  Final   Special Requests   Final    BOTTLES DRAWN AEROBIC AND ANAEROBIC Blood Culture adequate  volume   Culture   Final    NO GROWTH 5 DAYS Performed at [facility], [address], [place], [place] [address]    Report Status 08/21/2023 FINAL  Final  MRSA Next Gen by PCR, Nasal     Status: None   Collection Time: 08/16/23  5:23 AM   Specimen: Nasal Mucosa; Nasal Swab  Result Value Ref Range Status   MRSA by PCR Next Gen NOT DETECTED NOT DETECTED Final    Comment: (NOTE) The GeneXpert MRSA Assay (FDA approved for NASAL specimens only), is one component of a comprehensive MRSA colonization surveillance program. It is not intended to diagnose MRSA infection nor to guide or monitor treatment for MRSA infections. Test performance is not FDA approved in patients less than 58 years old. Performed at  [facility], [address] [place], [place] [address]   Culture, Respiratory w Gram Stain     Status: None   Collection Time: 08/16/23  6:27 AM   Specimen: Tracheal Aspirate; Respiratory  Result Value Ref Range Status   Specimen Description TRACHEAL ASPIRATE  Final   Special Requests NONE  Final   Gram Stain   Final    ABUNDANT WBC PRESENT, PREDOMINANTLY PMN FEW GRAM POSITIVE COCCI IN PAIRS IN CLUSTERS Performed at [facility], [address] [place], [place] [address]    Culture FEW STAPHYLOCOCCUS AUREUS  Final   Report Status 08/19/2023 FINAL  Final   Organism ID, Bacteria STAPHYLOCOCCUS AUREUS  Final      Susceptibility   Staphylococcus aureus - MIC*    CIPROFLOXACIN <=0.5 SENSITIVE Sensitive     ERYTHROMYCIN <=0.25 SENSITIVE Sensitive     GENTAMICIN <=0.5 SENSITIVE Sensitive     OXACILLIN 0.5 SENSITIVE Sensitive     TETRACYCLINE <=1 SENSITIVE Sensitive     VANCOMYCIN 1 SENSITIVE Sensitive     TRIMETH/SULFA <=10 SENSITIVE Sensitive     CLINDAMYCIN <=0.25 SENSITIVE Sensitive     RIFAMPIN <=0.5 SENSITIVE Sensitive     Inducible Clindamycin NEGATIVE Sensitive     LINEZOLID 2 SENSITIVE Sensitive     * FEW STAPHYLOCOCCUS AUREUS  Urine Culture (for pregnant,  neutropenic or urologic patients or patients with an indwelling urinary catheter)     Status: None   Collection Time: 08/16/23  7:54 AM   Specimen: Urine, Clean Catch  Result Value Ref Range Status   Specimen Description URINE, CLEAN CATCH  Final   Special Requests NONE  Final   Culture   Final    NO GROWTH Performed at [facility], [address], [place], [place] [address]    Report Status 08/17/2023 FINAL  Final     Radiology Studies: No results found.  Scheduled Meds:  aspirin  81 mg Per Tube Daily   atorvastatin  40 mg Per Tube Daily   Chlorhexidine Gluconate Cloth  6 each Topical Daily   clopidogrel  75 mg Per Tube Daily   vitamin B-12  1,000 mcg Per Tube Daily   feeding supplement  237 mL Oral BID BM   Gerhardt's butt cream   Topical BID   heparin  5,000 Units Subcutaneous Q8H   insulin aspart  0-20 Units Subcutaneous Q4H   ivabradine  2.5 mg Oral BID WC   leptospermum manuka honey  1 Application Topical Daily   pantoprazole (PROTONIX) IV  40 mg Intravenous QHS   sertraline  50 mg Per Tube Daily   valproic acid  500 mg Per Tube QHS   And   valproic acid  250 mg Per Tube q morning   Continuous Infusions:   ceFAZolin (ANCEF) IV Stopped (08/22/23 1428)     LOS: 6 days   Time spent: 45 minutes  Carollee Herter, DO  [facility]  08/22/2023, 4:50 PM

## 2023-08-22 NOTE — Progress Notes (Signed)
Speech Language Pathology Treatment: Dysphagia  Patient Details Name: [name] MRN: [id number] DOB: 14-Dec-1965 Today's Date: 08/22/2023 Time: 4098-1191 SLP Time Calculation (min) (ACUTE ONLY): 30 min  Assessment / Plan / Recommendation Clinical Impression  Pt seen with husband at bedside. He is attentive and worried, states she typically feeds herself, talks some, getup to a chair and enjoys family visits. He doesn't understand why she's been so sleepy and less interactive. SLP sat pt up, provided max verbal, tactile stimuli. Fading tactile cues to focus attention, initiate simple functional tasks with right hand. By end of session pt was feeding herself straw sips of strawberry Ensure with a touch on her right hand to remind her to initiate lifting the drink, also giving dap to her husband, tracking him and nodding yes to simple questions. Pt has capacity to do many of the activities she was doing at baseline, but is poorly interactive without stimuli. Not expected to be awake enough for meals without significant increase in activity. Suggested PT/OT.  Husband reports that pt has been on nectar thick liquids since 2021, doesn't like thickened liquids and gets dehydrated. Given push to increase oral intake, we will resume thickened liquids in case pt is fed when she is still very drowsy. Ultimately however recommend confirming least restrictive diet prior to d/c. Pt protected airway with thin liquids during FEES and would have improved QOL at facility with thin liquids.    HPI HPI: 57 year old woman who presented to [facility] 11/29 as a transfer from [facility] for AMS, suspected urosepsis. Patient is a resident of the [facility]) and had decreased responsiveness throughout the course of the day. EMS noted GCS 6, fever and tachycardia. 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 Patient was  intubated 11/29-12/1. PMHx significant for HTN, HLD, CVA (moderate-large R MCA infarct), T2DM, epilepsy, depression. 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 Cannot find any documentation of an MBS. Doesnt appear to have had one.      SLP Plan  Continue with current plan of care      Recommendations for follow up therapy are one component of a multi-disciplinary discharge planning process, led by the attending physician.  Recommendations may be updated based on patient status, additional functional criteria and insurance authorization.    Recommendations  Diet recommendations: Nectar-thick liquid;Dysphagia 1 (puree) Liquids provided via: Straw Medication Administration: Via alternative means Supervision: Staff to assist with self feeding;Full supervision/cueing for compensatory strategies Compensations: Slow rate;Small sips/bites Postural Changes and/or Swallow Maneuvers: Seated upright 90 degrees;Upright 30-60 min after meal                              Continue with current plan of care     Ryle Buscemi, Riley Nearing  08/22/2023, 11:39 AM

## 2023-08-22 NOTE — Assessment & Plan Note (Signed)
08-22-2023 BMI 29.81

## 2023-08-22 NOTE — Progress Notes (Signed)
Nutrition Brief Note  Secure chat received from DO in discussion with RN and SLP. Plans to discontinue Cortrak today and monitor PO intake through the weekend via calorie count to help determine plan of care (PEG versus hospice).   Per SLP, pt remains lethargic which is hindering ability to maintain adequate PO intake. Liquids were changed to nectar thick in the event she receives a meal and is fed while lethargic however SLP agreeable to Ensure without requiring thickening.   Calorie count ordered and envelope hung on door. Tickets to be collected tomorrow and then again on Monday 12/9. Please reach out in the meantime with additional nutrition related concerns/questions in the meantime.   Drusilla Kanner, RDN, LDN Clinical Nutrition

## 2023-08-23 DIAGNOSIS — A419 Sepsis, unspecified organism: Secondary | ICD-10-CM | POA: Diagnosis not present

## 2023-08-23 DIAGNOSIS — I69391 Dysphagia following cerebral infarction: Secondary | ICD-10-CM | POA: Diagnosis not present

## 2023-08-23 DIAGNOSIS — J15211 Pneumonia due to Methicillin susceptible Staphylococcus aureus: Secondary | ICD-10-CM | POA: Diagnosis not present

## 2023-08-23 DIAGNOSIS — G40909 Epilepsy, unspecified, not intractable, without status epilepticus: Secondary | ICD-10-CM | POA: Diagnosis not present

## 2023-08-23 DIAGNOSIS — E663 Overweight: Secondary | ICD-10-CM

## 2023-08-23 LAB — GLUCOSE, CAPILLARY
Glucose-Capillary: 164 mg/dL — ABNORMAL HIGH (ref 70–99)
Glucose-Capillary: 179 mg/dL — ABNORMAL HIGH (ref 70–99)
Glucose-Capillary: 182 mg/dL — ABNORMAL HIGH (ref 70–99)
Glucose-Capillary: 250 mg/dL — ABNORMAL HIGH (ref 70–99)
Glucose-Capillary: 259 mg/dL — ABNORMAL HIGH (ref 70–99)
Glucose-Capillary: 292 mg/dL — ABNORMAL HIGH (ref 70–99)

## 2023-08-23 MED ORDER — IVABRADINE HCL 5 MG PO TABS
5.0000 mg | ORAL_TABLET | Freq: Two times a day (BID) | ORAL | Status: DC
  Administered 2023-08-23 – 2023-08-26 (×6): 5 mg via ORAL
  Filled 2023-08-23 (×9): qty 1

## 2023-08-23 MED ORDER — METOPROLOL TARTRATE 5 MG/5ML IV SOLN: 2.5000 mg | INTRAVENOUS | Status: DC | PRN

## 2023-08-23 MED ORDER — POLYSACCHARIDE IRON COMPLEX 150 MG PO CAPS
150.0000 mg | ORAL_CAPSULE | Freq: Every day | ORAL | Status: DC
  Administered 2023-08-23 – 2023-08-26 (×4): 150 mg via ORAL
  Filled 2023-08-23 (×4): qty 1

## 2023-08-23 MED ORDER — IVABRADINE 2.5 MG HALF TABLET
2.5000 mg | ORAL_TABLET | Freq: Once | ORAL | Status: AC
  Administered 2023-08-23: 2.5 mg via ORAL
  Filled 2023-08-23: qty 1

## 2023-08-23 MED ORDER — PANTOPRAZOLE SODIUM 40 MG PO TBEC
40.0000 mg | DELAYED_RELEASE_TABLET | Freq: Every day | ORAL | Status: DC
  Administered 2023-08-23 – 2023-08-25 (×2): 40 mg via ORAL
  Filled 2023-08-23 (×2): qty 1

## 2023-08-23 NOTE — Plan of Care (Signed)

## 2023-08-23 NOTE — TOC Progression Note (Signed)
Transition of Care [facility]) - Progression Note    Patient Details  Name: [name] MRN: [id number] Date of Birth: [birth year]  [facility] CM/SW Contact  Maryjayne Kleven A [place], [place] Phone Number: 08/23/2023, 12:15 PM  Clinical Narrative:     Pt not medically stable for DC. CSW updated Jill Side at [facility]. Pt long term care there, and can return once stable.    TOC will continue to follow.   Expected Discharge Plan: Skilled Nursing Facility Barriers to Discharge: Continued Medical Work up  Expected Discharge Plan and Services In-house Referral: Clinical Social Work   Post Acute Care Choice: Skilled Nursing Facility Living arrangements for the past 2 months: Skilled Nursing Facility                                       Social Determinants of Health (SDOH) Interventions SDOH Screenings   Food Insecurity: Patient Unable To Answer (08/19/2023)  Housing: Patient Unable To Answer (08/19/2023)  Transportation Needs: Patient Unable To Answer (08/19/2023)  Utilities: Patient Unable To Answer (08/19/2023)  Tobacco Use: Low Risk  (08/16/2023)    Readmission Risk Interventions     No data to display

## 2023-08-23 NOTE — Congregational Nurse Program (Signed)
  The patient is receiving Protonix by the intravenous route.  Based on criteria approved by the Pharmacy and Therapeutics Committee and the Medical Executive Committee, the medication is being converted to the equivalent oral dose form.  These criteria include: -No active GI bleeding -Able to tolerate diet of full liquids (or better) or tube feeding -Able to tolerate other medications by the oral or enteral route  If you have any questions about this conversion, please contact the Pharmacy Department (phone [phone]).    Thank you,  Noah Delaine, RPh Clinical Pharmacist [phone] Please check AMION for all [facility] Pharmacy phone numbers After 10:00 PM, call Main Pharmacy [phone]

## 2023-08-23 NOTE — Progress Notes (Signed)
Orthopedic Tech Progress Note Patient Details:  [name] April 14, 1966 [id number]  Patient ID: [name], female   DOB: [birth year], 57 y.o.   MRN: [id number] Ordered bilateral resting hand splints Tonye Pearson 08/23/2023, 9:16 AM

## 2023-08-23 NOTE — Progress Notes (Signed)
PROGRESS NOTE    [name]  WNI:[id number] DOB: 11/30/1965 DOA: 08/16/2023 PCP: [facility]  Subjective: Pt seen and examined. Pt remains nonverbal.  On RA. Has cortrak in her nose.  Met at bedside with pt's husband [name]. Pt has been living at [facility] for 5 years now. Pt has not walked in 5 years. Is non-verbal.  Pt is more awake.  Discussed with ST and RD. Discussed with husband. Pt is safe to eat with dyphagia diet and nectar thick liquids. Will pull cortrak and start calorie counts.  Will see this weekend if pt can maintain hydration and enough po intake.  Discussed with husband that every time pt gets admitted to hospital, she gets weaker and weaker. She nearly died this admission due to septic shock. Discussed with husband that keeping pt at same level of function should be expectation rather than pt will improve.  Discussed PEG tube and whether this would be consider by some people as artificial nutrition/life support.  Husband states they have been married for 37 years.  Discussed with him that if pt unable to take enough liquids and solid to stay hydrate and nourished, he will need to consider PEG tube.  This would not prevent aspiration pneumonia and may prolong her suffering. I asked pt to think about pt's life and their wishes/dreams as they got older.  Will enlist palliative care to help husband with goals of care.   Hospital Course: HPI: 57 year old woman who presented to [facility] 11/29 as a transfer from [facility] for AMS, suspected urosepsis. PMHx significant for HTN, HLD, CVA (moderate-large R MCA infarct), T2DM, epilepsy, depression.    Patient is a resident of the [facility]) and had decreased responsiveness throughout the course of the day. EMS noted GCS 6, fever and tachycardia. Labs were notable for WBC 13.4, Hgb 9.8, Plt 286. INR 1.1. Na 156, K 3.9, CO2 27, Cr 0.99 (baseline 0.8-1), mildly elevated transaminases 78/74. LA 1.5  > 4.0. ABG 7.42/46/100/29.2. COVID/Flu/RSV negative. UA +protein, nitrites, trace leuks. CXR without active disease. 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 Patient was intubated at [facility] for airway protection.   PCCM consulted for ICU admission and transfer.  Significant Events: Admitted 08/16/2023 for septic shock RLL PNA, UTI, acute respiratory failure with hypoxia Intubated in ER on 08-16-2023 08-21-2023 Care transferred to Hospitalist service   Significant Labs: Admission WBC 13.4, HgB 9.8, Na 156, BUN 45, Scr 0.99  Significant Imaging Studies: Admission CXR Support apparatus in appropriate position. 2. No active disease.3. Possible mild thoracic aortic aneurysm. This could be confirmed with dedicated CT imaging. Admission CT chest/abd/pelvis consolidative opacity right lower lobe greater than middle and upper lobe. Pneumonias in the differential. Recommend follow-up. No bowel obstruction, free air. Minimal areas of fluid and stranding. ET tube and enteric tube. 08-19-2023 EEG suggestive of moderate diffuse encephalopathy. No seizures or epileptiform discharges were seen throughout the recording.   Antibiotic Therapy: Anti-infectives (From admission, onward)    Start     Dose/Rate Route Frequency Ordered Stop   08/19/23 1400  ceFAZolin (ANCEF) IVPB 2g/100 mL premix        2 g 200 mL/hr over 30 Minutes Intravenous Every 8 hours 08/19/23 0915 08/23/23 0559   08/17/23 0800  vancomycin (VANCOREADY) IVPB 1250 mg/250 mL  Status:  Discontinued        1,250 mg 166.7 mL/hr over 90 Minutes Intravenous Every 24  hours 08/16/23 1908 08/19/23 0915   08/16/23 2200  cefTRIAXone (ROCEPHIN) 2 g in sodium chloride 0.9 % 100 mL IVPB  Status:  Discontinued        2 g 200 mL/hr over 30 Minutes Intravenous Daily at bedtime 08/16/23 0632 08/19/23 0915   08/16/23 2200  vancomycin (VANCOREADY) IVPB  750 mg/150 mL  Status:  Discontinued        750 mg 150 mL/hr over 60 Minutes Intravenous Every 12 hours 08/16/23 0634 08/16/23 0928   08/16/23 0730  vancomycin (VANCOREADY) IVPB 1250 mg/250 mL        1,250 mg 166.7 mL/hr over 90 Minutes Intravenous  Once 08/16/23 0630 08/16/23 0939   08/16/23 0715  azithromycin (ZITHROMAX) 500 mg in sodium chloride 0.9 % 250 mL IVPB  Status:  Discontinued        500 mg 250 mL/hr over 60 Minutes Intravenous Every 24 hours 08/16/23 0628 08/16/23 0629   08/16/23 0645  azithromycin (ZITHROMAX) 500 mg in sodium chloride 0.9 % 250 mL IVPB        500 mg 250 mL/hr over 60 Minutes Intravenous Daily 08/16/23 0629 08/18/23 1148   08/16/23 0215  cefTRIAXone (ROCEPHIN) 2 g in sodium chloride 0.9 % 100 mL IVPB        2 g 200 mL/hr over 30 Minutes Intravenous  Once 08/16/23 0206 08/16/23 0246       Procedures: 08-16-2023 endotracheal intubation 08-18-2023 extubation 08-19-2023 EEG suggestive of moderate diffuse encephalopathy. No seizures or epileptiform discharges were seen throughout the recording.  08-19-2023 Cortrak placement  Consultants: PCCM    Assessment and Plan: * Septic shock (HCC) Since admission to 08-20-2023 improved, off pressors.  - CAP, MSSA on trach aspirate. Extubated 12/1 - MSSA on blood cx likely contaminate - Urine cx negative - continue Ancef 7 day course to tx MSSA PNA -cont scheduled duonebs  08-22-2023 resolved by the time she was transferred out of ICU on 08-20-2023  Dysphagia as late effect of cerebrovascular accident (CVA) 08-21-2023 Pt with a history of dyspahgia after CVA in 2021, last clinical evaluation recommended dys 2/nectar.  Seen by ST.  Currently has cortrak.  08-22-2023 Discussed with ST and RD. Discussed with husband. Pt is safe to eat with dyphagia diet and nectar thick liquids. Will pull cortrak and start calorie counts.   Will see this weekend if pt can maintain hydration and enough po intake.   Discussed with  husband that every time pt gets admitted to hospital, she gets weaker and weaker. She nearly died this admission due to septic shock. Discussed with husband that keeping pt at same level of function should be expectation rather than pt will improve.   Discussed PEG tube and whether this would be consider by some people as artificial nutrition/life support.   Husband states they have been married for 37 years.   Discussed with him that if pt unable to take enough liquids and solid to stay hydrate and nourished, he will need to consider PEG tube.  This would not prevent aspiration pneumonia and may prolong her suffering. I asked pt to think about pt's life and their wishes/dreams as they got older.  Will enlist palliative care to help husband with goals of care. 08-23-2023 cortrak removed. Calorie counts started yesterday. Will see how much she can eat and drink this weekend. Awaiting palliative care consult.  Pneumonia of both lower lobes due to methicillin susceptible Staphylococcus aureus (MSSA) (HCC) Since admission to 08-20-2023 improved, off pressors.  -  CAP, MSSA on trach aspirate. Extubated 12/1 - MSSA on blood cx likely contaminate - Urine cx negative - continue Ancef 7 day course to tx MSSA PNA -cont scheduled duonebs 08-21-2023 continue with IV Ancef. Today is Day #3 of 7 08-22-2023 discussed with pharmacy. Pt had been on IV rocephin from 11-29 through 12-2.  Will count those days of IV rocephin as therapy towards her treatment of MSSA pneumonia. Will continue IV Ancef for a total of 5 days. Today is Day #4 of 5. 08-23-2023 will complete IV ancef today Today Day #5 of 5. Resolved.  Weakness of left side of body 08-21-2023 chronic.  History of cardioembolic cerebrovascular accident (CVA) 08-21-2023 chronic.  Overweight (BMI 25.0-29.9) 08-22-2023 BMI 29.81  Pressure injury of skin  - bilateral heels, bilateral buttocks 08-21-2023 present on admission.  Altered mental status 08-21-2023  unclear what pt's baseline is. Pt lives in SNF prior to admission. 08-23-2023 pt is more awake today. Placed into recliner by OT  Malnutrition of moderate degree 08-21-2023 see RD note 08-21-2023. Moderate Malnutrition related to chronic illness as evidenced by mild fat depletion, severe muscle depletion, moderate muscle depletion.   08-22-2023 will stop cortrak. Start calorie counts. Will monitor over weekend to see how much liquids and solids she takes in.  08-23-2023 continue with calorie counts this weekend. Awaiting palliative care consult for GOC discussion in case pt does not take enough liquids/food to sustain herself.  Iron deficiency anemia 08-21-2023 Hg 8.1 g/dl today.   Iron studies show iron deficiency. On tube feeds that have iron. Iron/TIBC/Ferritin/ %Sat    Component Value Date/Time   IRON 20 (L) 08/16/2023 0656   TIBC 204 (L) 08/16/2023 0656   FERRITIN 388 (H) 08/16/2023 0656   IRONPCTSAT 10 (L) 08/16/2023 0656   08-23-2023 will start po nu-iron 150 mg daily  Epilepsy (HCC) 08-21-2023 continue with depakene       DVT prophylaxis: heparin injection 5,000 Units Start: 08/16/23 1400 SCDs Start: 08/16/23 0435    Code Status: Full Code Family Communication: no family at bedside. Last spoke to husband [name] on 08-22-2023 Disposition Plan: return to SNF Reason for continuing need for hospitalization: ongoing calorie counts  Objective: Vitals:   08/23/23 0034 08/23/23 0310 08/23/23 0522 08/23/23 0725  BP: (!) 128/115 124/87  116/70  Pulse: (!) 116 100  (!) 103  Resp: 17 16  16   Temp: 99.6 F (37.6 C) 98.2 F (36.8 C)  97.8 F (36.6 C)  TempSrc: Oral Oral    SpO2: 97% 97%  92%  Weight:   77.7 kg   Height:        Intake/Output Summary (Last 24 hours) at 08/23/2023 1047 Last data filed at 08/23/2023 0856 Gross per 24 hour  Intake 1114.75 ml  Output 400 ml  Net 714.75 ml   Filed Weights   08/21/23 0500 08/22/23 0707 08/23/23 0522  Weight: 76.3 kg 73.9 kg 77.7  kg    Examination:  Physical Exam Vitals and nursing note reviewed.  Constitutional:      General: She is not in acute distress.    Appearance: She is not toxic-appearing.     Comments: Awake Chronically ill appearing Sucking on her left index/middle finger  HENT:     Head: Normocephalic.  Eyes:     General: No scleral icterus. Cardiovascular:     Rate and Rhythm: Regular rhythm. Tachycardia present.  Pulmonary:     Effort: Pulmonary effort is normal.  Abdominal:     General: Bowel  sounds are normal.     Palpations: Abdomen is soft.  Skin:    General: Skin is warm and dry.     Capillary Refill: Capillary refill takes less than 2 seconds.  Neurological:     Comments: Non-verbal but awake    Data Reviewed: I have personally reviewed following labs and imaging studies  CBC: Recent Labs  Lab 08/17/23 0819 08/17/23 1531 08/18/23 0221 08/19/23 1117 08/20/23 0821 08/21/23 0729  WBC 13.3*  --  11.5* 11.1* 6.8 7.0  HGB 6.8* 8.3* 7.4* 8.3* 9.8* 8.1*  HCT 22.3* 27.6* 22.6* 25.2* 30.2* 24.9*  MCV 72.2*  --  73.4* 70.8* 72.1* 71.6*  PLT 182  --  169 183 212 214   Basic Metabolic Panel: Recent Labs  Lab 08/17/23 0513 08/17/23 1712 08/17/23 2008 08/18/23 0221 08/18/23 1807 08/19/23 0629 08/20/23 0554 08/21/23 0729  NA 150*  --   --  146*  --  148* 139 138  K 3.2*  --   --  3.3*  --  4.6 3.4* 3.8  CL 116*  --   --  111  --  114* 105 106  CO2 26  --   --  27  --  24 25 27   GLUCOSE 198*  --   --  157*  --  101* 160* 215*  BUN 24*  --   --  17  --  13 8 9   CREATININE 0.67  --   --  0.51  --  0.40* 0.49 0.60  CALCIUM 8.2*  --   --  7.9*  --  8.3* 8.3* 7.9*  MG 1.4* 2.3  --  2.2 2.1 2.0 1.6*  --   PHOS 1.6* 4.5 4.4 3.7 2.8 2.4* 2.1*  --    GFR: Estimated Creatinine Clearance: 73.9 mL/min (by C-G formula based on SCr of 0.6 mg/dL). Liver Function Tests: Recent Labs  Lab 08/17/23 0513 08/21/23 0729  AST 24 42*  ALT 35 47*  ALKPHOS 34* 47  BILITOT 0.3 0.2   PROT 4.7* 4.8*  ALBUMIN 2.0* 2.0*   CBG: Recent Labs  Lab 08/22/23 1137 08/22/23 1941 08/23/23 0033 08/23/23 0416 08/23/23 0722  GLUCAP 245* 220* 250* 182* 164*   Sepsis Labs: Recent Labs  Lab 08/17/23 1119  LATICACIDVEN 3.5*    Recent Results (from the past 240 hour(s))  Resp panel by RT-PCR (RSV, Flu A&B, Covid) Anterior Nasal Swab     Status: None   Collection Time: 08/16/23  2:05 AM   Specimen: Anterior Nasal Swab  Result Value Ref Range Status   SARS Coronavirus 2 by RT PCR NEGATIVE NEGATIVE Final    Comment: (NOTE) SARS-CoV-2 target nucleic acids are NOT DETECTED.  The SARS-CoV-2 RNA is generally detectable in upper respiratory specimens during the acute phase of infection. The lowest concentration of SARS-CoV-2 viral copies this assay can detect is 138 copies/mL. A negative result does not preclude SARS-Cov-2 infection and should not be used as the sole basis for treatment or other patient management decisions. A negative result may occur with  improper specimen collection/handling, submission of specimen other than nasopharyngeal swab, presence of viral mutation(s) within the areas targeted by this assay, and inadequate number of viral copies(<138 copies/mL). A negative result must be combined with clinical observations, patient history, and epidemiological information. The expected result is Negative.  Fact Sheet for Patients:  BloggerCourse.com  Fact Sheet for Healthcare Providers:  SeriousBroker.it  This test is no t yet approved or cleared by the [place]  States FDA and  has been authorized for detection and/or diagnosis of SARS-CoV-2 by FDA under an Emergency Use Authorization (EUA). This EUA will remain  in effect (meaning this test can be used) for the duration of the COVID-19 declaration under Section 564(b)(1) of the Act, 21 U.S.C.section 360bbb-3(b)(1), unless the authorization is terminated  or  revoked sooner.       Influenza A by PCR NEGATIVE NEGATIVE Final   Influenza B by PCR NEGATIVE NEGATIVE Final    Comment: (NOTE) The Xpert Xpress SARS-CoV-2/FLU/RSV plus assay is intended as an aid in the diagnosis of influenza from Nasopharyngeal swab specimens and should not be used as a sole basis for treatment. Nasal washings and aspirates are unacceptable for Xpert Xpress SARS-CoV-2/FLU/RSV testing.  Fact Sheet for Patients: BloggerCourse.com  Fact Sheet for Healthcare Providers: SeriousBroker.it  This test is not yet approved or cleared by the [place] FDA and has been authorized for detection and/or diagnosis of SARS-CoV-2 by FDA under an Emergency Use Authorization (EUA). This EUA will remain in effect (meaning this test can be used) for the duration of the COVID-19 declaration under Section 564(b)(1) of the Act, 21 U.S.C. section 360bbb-3(b)(1), unless the authorization is terminated or revoked.     Resp Syncytial Virus by PCR NEGATIVE NEGATIVE Final    Comment: (NOTE) Fact Sheet for Patients: BloggerCourse.com  Fact Sheet for Healthcare Providers: SeriousBroker.it  This test is not yet approved or cleared by the [place] FDA and has been authorized for detection and/or diagnosis of SARS-CoV-2 by FDA under an Emergency Use Authorization (EUA). This EUA will remain in effect (meaning this test can be used) for the duration of the COVID-19 declaration under Section 564(b)(1) of the Act, 21 U.S.C. section 360bbb-3(b)(1), unless the authorization is terminated or revoked.  Performed at [facility], [address] [place], [place] [address]   Blood Culture (routine x 2)     Status: Abnormal   Collection Time: 08/16/23  2:05 AM   Specimen: BLOOD  Result Value Ref Range Status   Specimen Description   Final    BLOOD BLOOD LEFT HAND Performed at [facility], [address], [place], [place] [address]    Special Requests   Final    BOTTLES DRAWN AEROBIC AND ANAEROBIC Blood Culture adequate volume Performed at [facility], [address] [place], [place] [address]    Culture  Setup Time   Final    GRAM POSITIVE COCCI Gram Stain Report Called to,Read Back By and Verified With: FLYNT,F ON 08/16/23 AT 1850 BY LOY,C AEROBIC AND ANAEROBIC BOTTLES CRITICAL RESULT CALLED TO, READ BACK BY AND VERIFIED WITH: PHARMD J. LEDFORD 08/16/2023 @ 2301 BY AB    Culture (A)  Final    STAPHYLOCOCCUS WARNERI STAPHYLOCOCCUS HOMINIS STAPHYLOCOCCUS EPIDERMIDIS THE SIGNIFICANCE OF ISOLATING THIS ORGANISM FROM A SINGLE SET OF BLOOD CULTURES WHEN MULTIPLE SETS ARE DRAWN IS UNCERTAIN. PLEASE NOTIFY THE MICROBIOLOGY DEPARTMENT WITHIN ONE WEEK IF SPECIATION AND SENSITIVITIES ARE REQUIRED. Performed at [facility], [address] [place], [place] [address]    Report Status 08/19/2023 FINAL  Final  Blood Culture ID Panel (Reflexed)     Status: Abnormal   Collection Time: 08/16/23  2:05 AM  Result Value Ref Range Status   Enterococcus faecalis NOT DETECTED NOT DETECTED Final   Enterococcus Faecium NOT DETECTED NOT DETECTED Final   Listeria monocytogenes NOT DETECTED NOT DETECTED Final   Staphylococcus species DETECTED (A) NOT DETECTED Final    Comment:  CRITICAL RESULT CALLED TO, READ BACK BY AND VERIFIED WITH: PHARMD J. LEDFORD 08/16/2023 @ 2301 BY AB    Staphylococcus aureus (BCID) NOT DETECTED NOT DETECTED Final   Staphylococcus epidermidis DETECTED (A) NOT DETECTED Final    Comment: Methicillin (oxacillin) resistant coagulase negative staphylococcus. Possible blood culture contaminant (unless isolated from more than one blood culture draw or clinical case suggests pathogenicity). No antibiotic treatment is indicated for blood  culture contaminants. CRITICAL RESULT CALLED TO, READ BACK BY AND VERIFIED WITH: PHARMD J. LEDFORD 08/16/2023 @ 2301 BY AB     Staphylococcus lugdunensis NOT DETECTED NOT DETECTED Final   Streptococcus species NOT DETECTED NOT DETECTED Final   Streptococcus agalactiae NOT DETECTED NOT DETECTED Final   Streptococcus pneumoniae NOT DETECTED NOT DETECTED Final   Streptococcus pyogenes NOT DETECTED NOT DETECTED Final   A.calcoaceticus-baumannii NOT DETECTED NOT DETECTED Final   Bacteroides fragilis NOT DETECTED NOT DETECTED Final   Enterobacterales NOT DETECTED NOT DETECTED Final   Enterobacter cloacae complex NOT DETECTED NOT DETECTED Final   Escherichia coli NOT DETECTED NOT DETECTED Final   Klebsiella aerogenes NOT DETECTED NOT DETECTED Final   Klebsiella oxytoca NOT DETECTED NOT DETECTED Final   Klebsiella pneumoniae NOT DETECTED NOT DETECTED Final   Proteus species NOT DETECTED NOT DETECTED Final   Salmonella species NOT DETECTED NOT DETECTED Final   Serratia marcescens NOT DETECTED NOT DETECTED Final   Haemophilus influenzae NOT DETECTED NOT DETECTED Final   Neisseria meningitidis NOT DETECTED NOT DETECTED Final   Pseudomonas aeruginosa NOT DETECTED NOT DETECTED Final   Stenotrophomonas maltophilia NOT DETECTED NOT DETECTED Final   Candida albicans NOT DETECTED NOT DETECTED Final   Candida auris NOT DETECTED NOT DETECTED Final   Candida glabrata NOT DETECTED NOT DETECTED Final   Candida krusei NOT DETECTED NOT DETECTED Final   Candida parapsilosis NOT DETECTED NOT DETECTED Final   Candida tropicalis NOT DETECTED NOT DETECTED Final   Cryptococcus neoformans/gattii NOT DETECTED NOT DETECTED Final   Methicillin resistance mecA/C DETECTED (A) NOT DETECTED Final    Comment: CRITICAL RESULT CALLED TO, READ BACK BY AND VERIFIED WITH: PHARMD J. LEDFORD 08/16/2023 @ 2301 BY AB Performed at [facility], [address] [place], [place] [address]   Blood Culture (routine x 2)     Status: None   Collection Time: 08/16/23  2:10 AM   Specimen: BLOOD  Result Value Ref Range Status   Specimen Description BLOOD  BLOOD RIGHT FOREARM  Final   Special Requests   Final    BOTTLES DRAWN AEROBIC AND ANAEROBIC Blood Culture adequate volume   Culture   Final    NO GROWTH 5 DAYS Performed at [facility], [address], [place], [place] [address]    Report Status 08/21/2023 FINAL  Final  MRSA Next Gen by PCR, Nasal     Status: None   Collection Time: 08/16/23  5:23 AM   Specimen: Nasal Mucosa; Nasal Swab  Result Value Ref Range Status   MRSA by PCR Next Gen NOT DETECTED NOT DETECTED Final    Comment: (NOTE) The GeneXpert MRSA Assay (FDA approved for NASAL specimens only), is one component of a comprehensive MRSA colonization surveillance program. It is not intended to diagnose MRSA infection nor to guide or monitor treatment for MRSA infections. Test performance is not FDA approved in patients less than 10 years old. Performed at [facility], [address], [place], [place] [address]   Culture, Respiratory w Gram Stain  Status: None   Collection Time: 08/16/23  6:27 AM   Specimen: Tracheal Aspirate; Respiratory  Result Value Ref Range Status   Specimen Description TRACHEAL ASPIRATE  Final   Special Requests NONE  Final   Gram Stain   Final    ABUNDANT WBC PRESENT, PREDOMINANTLY PMN FEW GRAM POSITIVE COCCI IN PAIRS IN CLUSTERS Performed at [facility], [address], [place], [place] [address]    Culture FEW STAPHYLOCOCCUS AUREUS  Final   Report Status 08/19/2023 FINAL  Final   Organism ID, Bacteria STAPHYLOCOCCUS AUREUS  Final      Susceptibility   Staphylococcus aureus - MIC*    CIPROFLOXACIN <=0.5 SENSITIVE Sensitive     ERYTHROMYCIN <=0.25 SENSITIVE Sensitive     GENTAMICIN <=0.5 SENSITIVE Sensitive     OXACILLIN 0.5 SENSITIVE Sensitive     TETRACYCLINE <=1 SENSITIVE Sensitive     VANCOMYCIN 1 SENSITIVE Sensitive     TRIMETH/SULFA <=10 SENSITIVE Sensitive     CLINDAMYCIN <=0.25 SENSITIVE Sensitive     RIFAMPIN <=0.5 SENSITIVE Sensitive     Inducible Clindamycin  NEGATIVE Sensitive     LINEZOLID 2 SENSITIVE Sensitive     * FEW STAPHYLOCOCCUS AUREUS  Urine Culture (for pregnant, neutropenic or urologic patients or patients with an indwelling urinary catheter)     Status: None   Collection Time: 08/16/23  7:54 AM   Specimen: Urine, Clean Catch  Result Value Ref Range Status   Specimen Description URINE, CLEAN CATCH  Final   Special Requests NONE  Final   Culture   Final    NO GROWTH Performed at [facility], [address], [place], [place] [address]    Report Status 08/17/2023 FINAL  Final     Radiology Studies: No results found.  Scheduled Meds:  aspirin  81 mg Per Tube Daily   atorvastatin  40 mg Per Tube Daily   Chlorhexidine Gluconate Cloth  6 each Topical Daily   clopidogrel  75 mg Per Tube Daily   vitamin B-12  1,000 mcg Per Tube Daily   feeding supplement  237 mL Oral BID BM   Gerhardt's butt cream   Topical BID   heparin  5,000 Units Subcutaneous Q8H   insulin aspart  0-20 Units Subcutaneous TID AC & HS   iron polysaccharides  150 mg Oral Daily   ivabradine  2.5 mg Oral Once   ivabradine  5 mg Oral BID WC   leptospermum manuka honey  1 Application Topical Daily   pantoprazole (PROTONIX) IV  40 mg Intravenous QHS   sertraline  50 mg Per Tube Daily   valproic acid  500 mg Per Tube QHS   And   valproic acid  250 mg Per Tube q morning   Continuous Infusions:   ceFAZolin (ANCEF) IV Stopped (08/23/23 0549)     LOS: 7 days   Time spent: 35 minutes  Carollee Herter, DO  [facility]  08/23/2023, 10:47 AM

## 2023-08-23 NOTE — Evaluation (Signed)
Occupational Therapy Evaluation Patient Details Name: [name] MRN: [id number] DOB: 02-20-1966 Today's Date: 08/23/2023   History of Present Illness 57 year old woman who presented to [facility] 11/29 as a transfer from [facility] for AMS, suspected urosepsis. PMHx significant for HTN, HLD, CVA (moderate-large R MCA infarct), T2DM, epilepsy, depression.   Clinical Impression   This 57 yo female admitted with above presents to acute OT from SNF with PLOF of being able to feed herself, sit up in a chair (not sure if lifted or stand pivot), converse some and enjoys family time. Currently she has intermittent eye opening, follows 10% of commands, needs total A for self-feeding and total A +2 for all mobility. She will continue to benefit from a trial of OT for 2 weeks (1x/week) to see if progress can be made.      If plan is discharge home, recommend the following: Two people to help with walking and/or transfers;Two people to help with bathing/dressing/bathroom;Assistance with feeding;Direct supervision/assist for medications management;Direct supervision/assist for financial management;Help with stairs or ramp for entrance;Assist for transportation;Assistance with cooking/housework    Functional Status Assessment  Patient has had a recent decline in their functional status and demonstrates the ability to make significant improvements in function in a reasonable and predictable amount of time.  Equipment Recommendations  Other (comment) (TBD next venue)       Precautions / Restrictions Precautions Precautions: Fall Restrictions Weight Bearing Restrictions: No      Mobility Bed Mobility Overal bed mobility: Needs Assistance Bed Mobility: Rolling, Sidelying to Sit Rolling: Total assist, +2 for physical assistance Sidelying to sit: Total assist, +2 for physical assistance            Transfers Overall transfer level: Needs assistance Equipment used: Ambulation equipment used Transfers: Bed to  chair/wheelchair/BSC               Transfer via Lift Equipment: Maximove    Balance Overall balance assessment: Needs assistance Sitting-balance support: No upper extremity supported, Feet supported Sitting balance-Leahy Scale: Zero Sitting balance - Comments: Hands placed on bed but no attempt to support self, when head is held up by therapist her tendency is for posterior lean, when head allowed to go in her normal positon (right and neck flexed) she leans right                                   ADL either performed or assessed with clinical judgement   ADL                                         General ADL Comments: total A     Vision   Additional Comments: pt non verbal and no family in room so not sure of visual function before this admission. Currently she does visually regard you when her name is called with sometimes having to ask her to open her eyes in the same sentence. When sitting EOB, chair, or supine in bed her tendency is right head and eye turn with flexed neck            Pertinent Vitals/Pain Pain Assessment Pain Assessment: CPOT Facial Expression: Relaxed, neutral Body Movements: Absence of movements Muscle Tension: Relaxed Compliance with ventilator (intubated pts.): N/A Vocalization (extubated pts.): N/A CPOT Total: 0     Extremity/Trunk Assessment Upper  Extremity Assessment Upper Extremity Assessment: RUE deficits/detail;LUE deficits/detail RUE Deficits / Details: moves spontaneously to reach up and scratch face, put finger in mouth, command followed x1 for squeeze hand (but not to let go); elbow a little tight for flexion, mild edema noted (not pitting) RUE Coordination: decreased fine motor;decreased gross motor LUE Deficits / Details: No AROM noted, tightness throughout all joints with ablility to get more PROM easily with prolonged stretch, more edema in this hand compared ot RUE but still not pitting. LUE  Coordination: decreased gross motor;decreased fine motor           Communication Communication Communication: Difficulty following commands/understanding;Difficulty communicating thoughts/reduced clarity of speech   Cognition   Behavior During Therapy: Flat affect Overall Cognitive Status: Impaired/Different from baseline Area of Impairment: Following commands                       Following Commands: Follows one step commands inconsistently       General Comments: Pt varied between eyes open and eyes shut (not sure she was lethargic then v. just able to maintain eyes open for long periods of time--she would open her eyes when name was called and asked to open her eyes); cognitive impairments pta (due to prior CVA) but now worse per report of husband as to how she was doing prior to this admission. She followed thumbs up on RUE (once), squeeze hand with RUE (but not to let go), and open eyes (90% of time)                Home Living Family/patient expects to be discharged to:: Skilled nursing facility             Home Layout: One level                   Additional Comments: Per report from SLP in talking with husband pt was able to feed herself, sit up in a chair (not sure if lift or was standing/pivoting), and did have some conversation (minimally) and enjoying family time              OT Problem List: Decreased strength;Decreased range of motion;Impaired balance (sitting and/or standing);Impaired vision/perception;Decreased coordination;Decreased cognition;Decreased safety awareness;Decreased knowledge of use of DME or AE;Impaired tone;Impaired sensation;Obesity;Impaired UE functional use;Increased edema      OT Treatment/Interventions: Self-care/ADL training;DME and/or AE instruction;Balance training;Patient/family education;Therapeutic exercise;Neuromuscular education    OT Goals(Current goals can be found in the care plan section) Acute Rehab OT  Goals Patient Stated Goal: unable (non verbal) OT Goal Formulation: Patient unable to participate in goal setting Time For Goal Achievement: 09/06/23 Potential to Achieve Goals: Fair  OT Frequency: Min 1X/week    Co-evaluation PT/OT/SLP Co-Evaluation/Treatment: Yes Reason for Co-Treatment: For patient/therapist safety;To address functional/ADL transfers PT goals addressed during session: Mobility/safety with mobility;Balance;Strengthening/ROM OT goals addressed during session: Strengthening/ROM;ADL's and self-care      AM-PAC OT "6 Clicks" Daily Activity     Outcome Measure Help from another person eating meals?: Total Help from another person taking care of personal grooming?: Total Help from another person toileting, which includes using toliet, bedpan, or urinal?: Total Help from another person bathing (including washing, rinsing, drying)?: Total Help from another person to put on and taking off regular upper body clothing?: Total Help from another person to put on and taking off regular lower body clothing?: Total 6 Click Score: 6   End of Session Equipment Utilized During Treatment:  (maxi  move) Nurse Communication: Mobility status;Need for lift equipment  Activity Tolerance: Patient tolerated treatment well (was intermittently lethargic) Patient left: in chair;with call bell/phone within reach;with chair alarm set  OT Visit Diagnosis: Other abnormalities of gait and mobility (R26.89);Cognitive communication deficit (R41.841);Muscle weakness (generalized) (M62.81);Hemiplegia and hemiparesis Symptoms and signs involving cognitive functions: Cerebral infarction (prior CVA but now this hospital admission non-verbal) Hemiplegia - Right/Left: Left Hemiplegia - dominant/non-dominant:  (unknown) Hemiplegia - caused by: Cerebral infarction                Time: 3086-5784 OT Time Calculation (min): 39 min Charges:  OT General Charges $OT Visit: 1 Visit OT Evaluation $OT Eval  Moderate Complexity: 1 Mod OT Treatments $Self Care/Home Management : 8-22 mins  Lindon Romp OT Acute Rehabilitation Services Office [phone]    Evette Georges 08/23/2023, 9:22 AM

## 2023-08-23 NOTE — Plan of Care (Signed)
  Problem: Education: Goal: Knowledge of General Education information will improve Description: Including pain rating scale, medication(s)/side effects and non-pharmacologic comfort measures Outcome: Progressing   Problem: Health Behavior/Discharge Planning: Goal: Ability to manage health-related needs will improve Outcome: Progressing   Problem: Clinical Measurements: Goal: Ability to maintain clinical measurements within normal limits will improve Outcome: Progressing Goal: Will remain free from infection Outcome: Progressing Goal: Diagnostic test results will improve Outcome: Progressing Goal: Respiratory complications will improve Outcome: Progressing Goal: Cardiovascular complication will be avoided Outcome: Progressing   Problem: Activity: Goal: Risk for activity intolerance will decrease Outcome: Progressing   Problem: Nutrition: Goal: Adequate nutrition will be maintained Outcome: Progressing   Problem: Coping: Goal: Level of anxiety will decrease Outcome: Progressing   Problem: Elimination: Goal: Will not experience complications related to bowel motility Outcome: Progressing Goal: Will not experience complications related to urinary retention Outcome: Progressing   Problem: Pain Management: Goal: General experience of comfort will improve Outcome: Progressing   Problem: Safety: Goal: Ability to remain free from injury will improve Outcome: Progressing   Problem: Skin Integrity: Goal: Risk for impaired skin integrity will decrease Outcome: Progressing   Problem: Education: Goal: Ability to describe self-care measures that may prevent or decrease complications (Diabetes Survival Skills Education) will improve Outcome: Progressing Goal: Individualized Educational Video(s) Outcome: Progressing   Problem: Coping: Goal: Ability to adjust to condition or change in health will improve Outcome: Progressing   Problem: Health Behavior/Discharge  Planning: Goal: Ability to identify and utilize available resources and services will improve Outcome: Progressing Goal: Ability to manage health-related needs will improve Outcome: Progressing   Problem: Metabolic: Goal: Ability to maintain appropriate glucose levels will improve Outcome: Progressing   Problem: Nutritional: Goal: Maintenance of adequate nutrition will improve Outcome: Progressing Goal: Progress toward achieving an optimal weight will improve Outcome: Progressing   Problem: Skin Integrity: Goal: Risk for impaired skin integrity will decrease Outcome: Progressing   Problem: Tissue Perfusion: Goal: Adequacy of tissue perfusion will improve Outcome: Progressing   Problem: Fluid Volume: Goal: Ability to maintain a balanced intake and output will improve Outcome: Not Progressing Weight gain of 3.8 kg.

## 2023-08-23 NOTE — Progress Notes (Signed)
Patient HR 116 with BP 128/115. [name], MD was notified.   [name]

## 2023-08-23 NOTE — Progress Notes (Signed)
Calorie Count Note  48 hour calorie count ordered.  Diet: DYS 1 NTL Supplements: Ensure Plus High Protein po BID, each supplement provides 350 kcal and 20 grams of protein.   Breakfast: Eggs, waffle, Juice, fruit, grits 100% Lunch: pending Dinner: pending Supplements: 100%, plus snack not noted as to what snack is  Total intake: 1213 kcal (75% of minimum estimated needs)  46.70 protein (53% of minimum estimated needs)  Nutrition Dx: Moderate Malnutrition related to chronic illness as evidenced by mild fat depletion, severe muscle depletion, moderate muscle depletion. - remains applicabl  Goal: Patient will meet greater than or equal to 90% of their needs   Intervention: Ensure Plus High Protein po BID, each supplement provides 350 kcal and 20 grams of protein.  Pt noted to have good appetite suspect oral intake is providing adequate nutrition.  Possible discharge back to long term care facility.  Jamelle Haring RDN, LDN Clinical Dietitian  Pleas see Amion for contact information

## 2023-08-23 NOTE — Progress Notes (Signed)
     Referral received for [name]: goals of care discussion. Chart reviewed. Patient assessed and is unable to engage appropriately in discussions. I was able to speak with the patient's husband Ronia Ferry. GOC meeting scheduled for 08/23/23 in the morning. Family is aware we will meet at patient's bedside.   Detailed note and recommendations to follow once GOC has been completed.   Thank you for your referral and allowing PMT to assist in Zinna [place]uls[place] care.   Sarina Ser, NP Palliative Medicine Team  Team Phone # [phone]   NO CHARGE

## 2023-08-23 NOTE — Progress Notes (Signed)
Speech Language Pathology Treatment: Dysphagia  Patient Details Name: [name] MRN: [id number] DOB: 11/07/1965 Today's Date: 08/23/2023 Time: 4401-0272 SLP Time Calculation (min) (ACUTE ONLY): 20 min  Assessment / Plan / Recommendation Clinical Impression  Pt found up in chair, sucking on her thumb. Alert, lifts head to track me, nods yes to 25% of questions. NT reports pt ate everything on her tray except for grits. Pt followed one step commands for SLP to examine mouth for residue from breakfast. There was none. SLP offered pt a cold ensure with a straw. Pt held this with her left hand with min assist and fed herself all of the Ensure. One throat clear after complete. Took note of the food reportedly consumed since the meal ticket was discarded. Will f/u next week to check for tolerance  HPI HPI: 57 year old woman who presented to [facility] 11/29 as a transfer from [facility] for AMS, suspected urosepsis. Patient is a resident of the [facility] [place]) and had decreased responsiveness throughout the course of the day. EMS noted GCS 6, fever and tachycardia. 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 Patient was intubated 11/29-12/1. PMHx significant for HTN, HLD, CVA (moderate-large R MCA infarct), T2DM, epilepsy, depression. 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 Cannot find any documentation of an MBS. Doesnt appear to have had one.      SLP Plan  Continue with current plan of care      Recommendations for follow up therapy are one component of a multi-disciplinary discharge planning process, led by the attending physician.  Recommendations may be updated based on patient status, additional functional criteria and insurance authorization.    Recommendations  Diet recommendations: Nectar-thick liquid;Dysphagia 1 (puree) Liquids  provided via: Straw Supervision: Staff to assist with self feeding;Full supervision/cueing for compensatory strategies Compensations: Slow rate;Small sips/bites Postural Changes and/or Swallow Maneuvers: Seated upright 90 degrees;Upright 30-60 min after meal                              Continue with current plan of care     Eric Nees, Riley Nearing  08/23/2023, 9:59 AM

## 2023-08-23 NOTE — Evaluation (Signed)
Physical Therapy Evaluation Patient Details Name: [name] MRN: [id number] DOB: 12/06/1965 Today's Date: 08/23/2023  History of Present Illness  57 year old woman who presented to [facility] 11/29 as a transfer from [facility] for AMS, suspected urosepsis. PMHx significant for HTN, HLD, CVA (moderate-large R MCA infarct), T2DM, epilepsy, depression.  Clinical Impression  Pt admitted with above. Presents from SNF with reported PLOF of being able to feed self and sit up in a chair. On PT evaluation, pt alert with stimulation, follows 10% of commands and requires two person total assist for feeding and mobility. Utilized maxi move to transfer out of bed to chair to promote upright tolerance and for feeding with NT present. Would benefit from air mattress bed, Q2 turns, and Prevalon boots for pressure relief (RN/MD notified). Will continue trial of acute PT to address ROM, pressure relief, balance, strengthening, transfers.      If plan is discharge home, recommend the following: Two people to help with walking and/or transfers;Two people to help with bathing/dressing/bathroom;Assistance with feeding;Direct supervision/assist for medications management;Direct supervision/assist for financial management   Can travel by private vehicle   No    Equipment Recommendations None recommended by PT  Recommendations for Other Services       Functional Status Assessment Patient has had a recent decline in their functional status and/or demonstrates limited ability to make significant improvements in function in a reasonable and predictable amount of time     Precautions / Restrictions Precautions Precautions: Fall Restrictions Weight Bearing Restrictions: No      Mobility  Bed Mobility Overal bed mobility: Needs Assistance Bed Mobility: Rolling, Sidelying to Sit Rolling: Total assist, +2 for physical assistance Sidelying to sit: Total assist, +2 for physical assistance            Transfers Overall  transfer level: Needs assistance Equipment used: Ambulation equipment used Transfers: Bed to chair/wheelchair/BSC               Transfer via Lift Equipment: Maximove  Ambulation/Gait                  Stairs            Wheelchair Mobility     Tilt Bed    Modified Rankin (Stroke Patients Only)       Balance Overall balance assessment: Needs assistance Sitting-balance support: No upper extremity supported, Feet supported Sitting balance-Leahy Scale: Zero Sitting balance - Comments: Hands placed on bed but no attempt to support self, when head is held up by therapist her tendency is for posterior lean, when head allowed to go in her normal positon (right and neck flexed) she leans right                                     Pertinent Vitals/Pain Pain Assessment Pain Assessment: Faces Faces Pain Scale: Hurts a little bit Pain Location: slight grimacing with neck ROM Pain Descriptors / Indicators: Grimacing Pain Intervention(s): Monitored during session    Home Living Family/patient expects to be discharged to:: Skilled nursing facility               Home Layout: One level   Additional Comments: [facility]    Prior Function Prior Level of Function : Needs assist             Mobility Comments: Per report from SLP in talking with husband pt was able to  sit  up in a chair (not sure if lift or was standing/pivoting), and did have some conversation (minimally) and enjoying family time ADLs Comments: Able to feed self     Extremity/Trunk Assessment   Upper Extremity Assessment Upper Extremity Assessment: Defer to OT evaluation RUE Deficits / Details: moves spontaneously to reach up and scratch face, put finger in mouth, command followed x1 for squeeze hand (but not to let go); elbow a little tight for flexion, mild edema noted (not pitting) RUE Coordination: decreased fine motor;decreased gross motor LUE Deficits / Details: No  AROM noted, tightness throughout all joints with ablility to get more PROM easily with prolonged stretch, more edema in this hand compared ot RUE but still not pitting. LUE Coordination: decreased gross motor;decreased fine motor    Lower Extremity Assessment Lower Extremity Assessment: RLE deficits/detail;LLE deficits/detail RLE Deficits / Details: ROM WFL but no spontaneous movement noted. Heel wounds. LLE Deficits / Details: Hx L CVA. Spontaneous toe movement. PROM WFL. Heel wounds    Cervical / Trunk Assessment Cervical / Trunk Assessment: Other exceptions Cervical / Trunk Exceptions: Forward head posture, increased right cervical lateral flexion  Communication   Communication Communication: Difficulty following commands/understanding;Difficulty communicating thoughts/reduced clarity of speech Following commands: Follows one step commands inconsistently  Cognition Arousal: Lethargic Behavior During Therapy: Flat affect Overall Cognitive Status: Impaired/Different from baseline Area of Impairment: Following commands                       Following Commands: Follows one step commands inconsistently       General Comments: Pt nonverbal at baseline. Pt varied between eyes open and eyes shut (not sure she was lethargic then v. just able to maintain eyes open for long periods of time--she would open her eyes when name was called and asked to open her eyes); cognitive impairments pta (due to prior CVA) but now worse per report of husband as to how she was doing prior to this admission. She followed thumbs up on RUE (once), squeeze hand with RUE (but not to let go), and open eyes (90% of time)        General Comments      Exercises     Assessment/Plan    PT Assessment Patient needs continued PT services  PT Problem List Decreased strength;Decreased balance;Decreased mobility;Decreased cognition       PT Treatment Interventions Functional mobility training;Therapeutic  activities;Therapeutic exercise;Balance training;Neuromuscular re-education;Patient/family education    PT Goals (Current goals can be found in the Care Plan section)  Acute Rehab PT Goals Patient Stated Goal: unable PT Goal Formulation: Patient unable to participate in goal setting Time For Goal Achievement: 09/06/23 Potential to Achieve Goals: Fair    Frequency Min 1X/week     Co-evaluation PT/OT/SLP Co-Evaluation/Treatment: Yes Reason for Co-Treatment: For patient/therapist safety;To address functional/ADL transfers PT goals addressed during session: Mobility/safety with mobility;Balance;Strengthening/ROM OT goals addressed during session: Strengthening/ROM;ADL's and self-care       AM-PAC PT "6 Clicks" Mobility  Outcome Measure Help needed turning from your back to your side while in a flat bed without using bedrails?: Total Help needed moving from lying on your back to sitting on the side of a flat bed without using bedrails?: Total Help needed moving to and from a bed to a chair (including a wheelchair)?: Total Help needed standing up from a chair using your arms (e.g., wheelchair or bedside chair)?: Total Help needed to walk in hospital room?: Total Help needed climbing 3-5 steps  with a railing? : Total 6 Click Score: 6    End of Session   Activity Tolerance: Patient tolerated treatment well Patient left: in chair;with call bell/phone within reach;with chair alarm set Nurse Communication: Mobility status;Need for lift equipment PT Visit Diagnosis: Other abnormalities of gait and mobility (R26.89)    Time: 4098-1191 PT Time Calculation (min) (ACUTE ONLY): 40 min   Charges:   PT Evaluation $PT Eval Moderate Complexity: 1 Mod   PT General Charges $$ ACUTE PT VISIT: 1 Visit         Lillia Pauls, PT, DPT Acute Rehabilitation Services Office [phone]   Norval Morton 08/23/2023, 10:54 AM

## 2023-08-24 ENCOUNTER — Inpatient Hospital Stay (HOSPITAL_COMMUNITY): Payer: BC Managed Care – PPO

## 2023-08-24 DIAGNOSIS — R569 Unspecified convulsions: Secondary | ICD-10-CM | POA: Diagnosis not present

## 2023-08-24 DIAGNOSIS — Z7189 Other specified counseling: Secondary | ICD-10-CM | POA: Diagnosis not present

## 2023-08-24 DIAGNOSIS — Z515 Encounter for palliative care: Secondary | ICD-10-CM | POA: Diagnosis not present

## 2023-08-24 DIAGNOSIS — A419 Sepsis, unspecified organism: Secondary | ICD-10-CM | POA: Diagnosis not present

## 2023-08-24 DIAGNOSIS — I69391 Dysphagia following cerebral infarction: Secondary | ICD-10-CM | POA: Diagnosis not present

## 2023-08-24 DIAGNOSIS — R4182 Altered mental status, unspecified: Secondary | ICD-10-CM | POA: Diagnosis not present

## 2023-08-24 DIAGNOSIS — R531 Weakness: Secondary | ICD-10-CM

## 2023-08-24 DIAGNOSIS — R6521 Severe sepsis with septic shock: Secondary | ICD-10-CM | POA: Diagnosis not present

## 2023-08-24 DIAGNOSIS — J15211 Pneumonia due to Methicillin susceptible Staphylococcus aureus: Secondary | ICD-10-CM | POA: Diagnosis not present

## 2023-08-24 LAB — COMPREHENSIVE METABOLIC PANEL
ALT: 65 U/L — ABNORMAL HIGH (ref 0–44)
AST: 82 U/L — ABNORMAL HIGH (ref 15–41)
Albumin: 2.1 g/dL — ABNORMAL LOW (ref 3.5–5.0)
Alkaline Phosphatase: 55 U/L (ref 38–126)
Anion gap: 8 (ref 5–15)
BUN: 21 mg/dL — ABNORMAL HIGH (ref 6–20)
CO2: 27 mmol/L (ref 22–32)
Calcium: 8.7 mg/dL — ABNORMAL LOW (ref 8.9–10.3)
Chloride: 107 mmol/L (ref 98–111)
Creatinine, Ser: 0.69 mg/dL (ref 0.44–1.00)
GFR, Estimated: >60 mL/min (ref >60)
Glucose, Bld: 199 mg/dL — ABNORMAL HIGH (ref 70–99)
Potassium: 3.8 mmol/L (ref 3.5–5.1)
Sodium: 142 mmol/L (ref 135–145)
Total Bilirubin: 0.4 mg/dL (ref <1.2)
Total Protein: 5.1 g/dL — ABNORMAL LOW (ref 6.5–8.1)

## 2023-08-24 LAB — GLUCOSE, CAPILLARY
Glucose-Capillary: 152 mg/dL — ABNORMAL HIGH (ref 70–99)
Glucose-Capillary: 206 mg/dL — ABNORMAL HIGH (ref 70–99)
Glucose-Capillary: 249 mg/dL — ABNORMAL HIGH (ref 70–99)
Glucose-Capillary: 217 mg/dL — ABNORMAL HIGH (ref 70–99)
Glucose-Capillary: 168 mg/dL — ABNORMAL HIGH (ref 70–99)

## 2023-08-24 LAB — CBC
HCT: 24.9 % — ABNORMAL LOW (ref 36.0–46.0)
Hemoglobin: 8 g/dL — ABNORMAL LOW (ref 12.0–15.0)
MCH: 23.5 pg — ABNORMAL LOW (ref 26.0–34.0)
MCHC: 32.1 g/dL (ref 30.0–36.0)
MCV: 73 fL — ABNORMAL LOW (ref 80.0–100.0)
Platelets: 302 10*3/uL (ref 150–400)
RBC: 3.41 MIL/uL — ABNORMAL LOW (ref 3.87–5.11)
RDW: 22.3 % — ABNORMAL HIGH (ref 11.5–15.5)
WBC: 7.6 10*3/uL (ref 4.0–10.5)
nRBC: 0 % (ref 0.0–0.2)

## 2023-08-24 LAB — AMMONIA: Ammonia: 26 umol/L (ref 9–35)

## 2023-08-24 MED ORDER — VALPROATE SODIUM 100 MG/ML IV SOLN
500.0000 mg | Freq: Every day | INTRAVENOUS | Status: DC
  Administered 2023-08-25 (×2): 500 mg via INTRAVENOUS
  Filled 2023-08-24: qty 500
  Filled 2023-08-24 (×2): qty 5

## 2023-08-24 MED ORDER — SERTRALINE HCL 25 MG PO TABS
50.0000 mg | ORAL_TABLET | Freq: Every day | ORAL | Status: DC
  Administered 2023-08-24 – 2023-08-26 (×3): 50 mg via ORAL
  Filled 2023-08-24: qty 2

## 2023-08-24 MED ORDER — DIVALPROEX SODIUM 500 MG PO DR TAB
500.0000 mg | DELAYED_RELEASE_TABLET | Freq: Every day | ORAL | Status: DC
  Filled 2023-08-24: qty 1

## 2023-08-24 MED ORDER — ATORVASTATIN CALCIUM 40 MG PO TABS
40.0000 mg | ORAL_TABLET | Freq: Every day | ORAL | Status: DC
Start: 2023-08-24 — End: 2023-08-26
  Administered 2023-08-24 – 2023-08-26 (×3): 40 mg via ORAL
  Filled 2023-08-24 (×2): qty 1

## 2023-08-24 MED ORDER — VITAMIN B-12 1000 MCG PO TABS
1000.0000 ug | ORAL_TABLET | Freq: Every day | ORAL | Status: DC
  Administered 2023-08-24 – 2023-08-26 (×3): 1000 ug via ORAL
  Filled 2023-08-24 (×2): qty 1

## 2023-08-24 MED ORDER — HYDRALAZINE HCL 20 MG/ML IJ SOLN: 10.0000 mg | Freq: Four times a day (QID) | INTRAMUSCULAR | Status: DC | PRN

## 2023-08-24 MED ORDER — DIVALPROEX SODIUM 250 MG PO DR TAB
250.0000 mg | DELAYED_RELEASE_TABLET | Freq: Every day | ORAL | Status: DC
  Administered 2023-08-24: 250 mg via ORAL
  Filled 2023-08-24: qty 1

## 2023-08-24 MED ORDER — VALPROATE SODIUM 100 MG/ML IV SOLN
250.0000 mg | Freq: Every morning | INTRAVENOUS | Status: DC
  Administered 2023-08-25 – 2023-08-26 (×2): 250 mg via INTRAVENOUS
  Filled 2023-08-24 (×3): qty 2.5

## 2023-08-24 MED ORDER — CLOPIDOGREL BISULFATE 75 MG PO TABS
75.0000 mg | ORAL_TABLET | Freq: Every day | ORAL | Status: DC
  Administered 2023-08-24 – 2023-08-26 (×3): 75 mg via ORAL
  Filled 2023-08-24 (×2): qty 1

## 2023-08-24 MED ORDER — ASPIRIN 81 MG PO CHEW
81.0000 mg | CHEWABLE_TABLET | Freq: Every day | ORAL | Status: DC
Start: 2023-08-24 — End: 2023-08-26
  Administered 2023-08-24 – 2023-08-26 (×3): 81 mg via ORAL
  Filled 2023-08-24 (×2): qty 1

## 2023-08-24 MED ORDER — CARVEDILOL 3.125 MG PO TABS
1.5600 mg | ORAL_TABLET | Freq: Two times a day (BID) | ORAL | Status: DC
  Administered 2023-08-24 – 2023-08-26 (×3): 1.56 mg via ORAL
  Filled 2023-08-24 (×4): qty 1

## 2023-08-24 NOTE — Progress Notes (Addendum)
  Altered mentation: Responded to rapid response at 8:30 PM for as patient was not responding to any voice.  [name] is a 57 y.o. female, who was originally admitted for Septic shock (HCC) in the setting of left lower lobe pneumonia, UTI and acute hypoxic respiratory failure she was intubated and extubated on 11/29. Per chart review patient has been treated for septic shock in the setting of pneumonia MSSA pneumonia.  She also has significant dysphagia initially tube feeding which has been transition to oral diet today.  She also has weakness of the left side of her body initially started on 12/4.   Patient send bedside reported that throughout the day patient was lethargic but able to taking oral medications and follow command.  She is nonverbal at the baseline.  Patient has medical history of CVA, hypertension, hyperlipidemia, epilepsy DM type II and chronic depression.  During my evaluation at the bedside at 8:35 PM patient found completely altered not responding to voice, did not sternal rub.  She was able to maintaining her airway.  Physical exam showing carotid pulse 2+ and bilateral upper and lower lung field good airway entry.  Vitals showed blood pressure 190s/78, heart rate 101, O2 sat 95% at room air.  Blood glucose 217.  Altered mentation/acute metabolic encephalopathy differential include: Concern for stroke, aspiration, absence seizure with postictal phase. -Immediately will order chest x-ray and CT head as there is some concern for stroke versus aspiration. -Checking CBC, CMP and ammonia and ABG. -Checking chest x-ray, CT head and EEG -Continue to check pulse ox, cardiac monitor, keep [place] oxygen to keep O2 sat above 92%.  Continue aspiration precaution. -As there is concern for aspiration changing oral Depakote to IV form. -Informed bedside nurse to hold oral medications for tonight.  Will add hydralazine as needed. -Keeping patient n.p.o. until patient's mentation comes back to  baseline.   Addendum: - CBC unremarkable for any new finding.  Stable H&H.  No evidence of leukocytosis. -CMP showed some elevated BUN 21, when patient initially admitted BUN was 40 which gradually improved but again trended up 9-21.  Otherwise no evidence of AKI and electrolyte derangement.  Mild transaminitis.  Normal ammonia level. -Pending CT head and EEG.  [name], MD [facility] 08/24/2023, 9:14 PM

## 2023-08-24 NOTE — Progress Notes (Signed)
PROGRESS NOTE    [name]  VWU:[id number] DOB: 10-31-1965 DOA: 08/16/2023 PCP: [facility]  Subjective: Pt seen and examined. Stable overnight. Has been eating adequately. Palliative care met with pt's husband today.  I called pt's husband on the phone.   Hospital Course: HPI: 57 year old woman who presented to [facility] 11/29 as a transfer from [facility] for AMS, suspected urosepsis. PMHx significant for HTN, HLD, CVA (moderate-large R MCA infarct), T2DM, epilepsy, depression.    Patient is a resident of the [facility]) and had decreased responsiveness throughout the course of the day. EMS noted GCS 6, fever and tachycardia. Labs were notable for WBC 13.4, Hgb 9.8, Plt 286. INR 1.1. Na 156, K 3.9, CO2 27, Cr 0.99 (baseline 0.8-1), mildly elevated transaminases 78/74. LA 1.5 > 4.0. ABG 7.42/46/100/29.2. COVID/Flu/RSV negative. UA +protein, nitrites, trace leuks. CXR without active disease. 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 Patient was intubated at [facility] for airway protection.   PCCM consulted for ICU admission and transfer.  Significant Events: Admitted 08/16/2023 for septic shock RLL PNA, UTI, acute respiratory failure with hypoxia Intubated in ER on 08-16-2023 08-21-2023 Care transferred to Hospitalist service   Significant Labs: Admission WBC 13.4, HgB 9.8, Na 156, BUN 45, Scr 0.99  Significant Imaging Studies: Admission CXR Support apparatus in appropriate position. 2. No active disease.3. Possible mild thoracic aortic aneurysm. This could be confirmed with dedicated CT imaging. Admission CT chest/abd/pelvis consolidative opacity right lower lobe greater than middle and upper lobe. Pneumonias in the differential. Recommend follow-up. No bowel obstruction, free air. Minimal areas of fluid and stranding. ET tube and enteric  tube. 08-19-2023 EEG suggestive of moderate diffuse encephalopathy. No seizures or epileptiform discharges were seen throughout the recording.   Antibiotic Therapy: Anti-infectives (From admission, onward)    Start     Dose/Rate Route Frequency Ordered Stop   08/19/23 1400  ceFAZolin (ANCEF) IVPB 2g/100 mL premix        2 g 200 mL/hr over 30 Minutes Intravenous Every 8 hours 08/19/23 0915 08/23/23 0559   08/17/23 0800  vancomycin (VANCOREADY) IVPB 1250 mg/250 mL  Status:  Discontinued        1,250 mg 166.7 mL/hr over 90 Minutes Intravenous Every 24 hours 08/16/23 1908 08/19/23 0915   08/16/23 2200  cefTRIAXone (ROCEPHIN) 2 g in sodium chloride 0.9 % 100 mL IVPB  Status:  Discontinued        2 g 200 mL/hr over 30 Minutes Intravenous Daily at bedtime 08/16/23 0632 08/19/23 0915   08/16/23 2200  vancomycin (VANCOREADY) IVPB 750 mg/150 mL  Status:  Discontinued        750 mg 150 mL/hr over 60 Minutes Intravenous Every 12 hours 08/16/23 0634 08/16/23 0928   08/16/23 0730  vancomycin (VANCOREADY) IVPB 1250 mg/250 mL        1,250 mg 166.7 mL/hr over 90 Minutes Intravenous  Once 08/16/23 0630 08/16/23 0939   08/16/23 0715  azithromycin (ZITHROMAX) 500 mg in sodium chloride 0.9 % 250 mL IVPB  Status:  Discontinued        500 mg 250 mL/hr over 60 Minutes Intravenous Every 24 hours 08/16/23 0628 08/16/23 0629   08/16/23 0645  azithromycin (ZITHROMAX) 500 mg in sodium chloride 0.9 % 250 mL IVPB        500 mg 250 mL/hr over 60 Minutes Intravenous Daily 08/16/23 0629 08/18/23 1148   08/16/23 0215  cefTRIAXone (ROCEPHIN) 2 g in sodium chloride 0.9 % 100 mL IVPB        2 g 200 mL/hr over 30 Minutes Intravenous  Once 08/16/23 0206 08/16/23 0246       Procedures: 08-16-2023 endotracheal intubation 08-18-2023 extubation 08-19-2023 EEG suggestive of moderate diffuse encephalopathy. No seizures or epileptiform discharges were seen throughout the recording.  08-19-2023 Cortrak  placement  Consultants: PCCM    Assessment and Plan: * Septic shock (HCC) Since admission to 08-20-2023 improved, off pressors.  - CAP, MSSA on trach aspirate. Extubated 12/1 - MSSA on blood cx likely contaminate - Urine cx negative - continue Ancef 7 day course to tx MSSA PNA -cont scheduled duonebs  08-22-2023 resolved by the time she was transferred out of ICU on 08-20-2023  Dysphagia as late effect of cerebrovascular accident (CVA) 08-21-2023 Pt with a history of dyspahgia after CVA in 2021, last clinical evaluation recommended dys 2/nectar.  Seen by ST.  Currently has cortrak.  08-22-2023 Discussed with ST and RD. Discussed with husband. Pt is safe to eat with dyphagia diet and nectar thick liquids. Will pull cortrak and start calorie counts.   Will see this weekend if pt can maintain hydration and enough po intake.   Discussed with husband that every time pt gets admitted to hospital, she gets weaker and weaker. She nearly died this admission due to septic shock. Discussed with husband that keeping pt at same level of function should be expectation rather than pt will improve.   Discussed PEG tube and whether this would be consider by some people as artificial nutrition/life support.   Husband states they have been married for 37 years.   Discussed with him that if pt unable to take enough liquids and solid to stay hydrate and nourished, he will need to consider PEG tube.  This would not prevent aspiration pneumonia and may prolong her suffering. I asked pt to think about pt's life and their wishes/dreams as they got older.  Will enlist palliative care to help husband with goals of care. 08-23-2023 cortrak removed. Calorie counts started yesterday. Will see how much she can eat and drink this weekend. Awaiting palliative care consult. 08-24-2023 calorie counts to finish today. Pt eating enough in my estimation but will see what the expert dietician says.  Weakness of left side of  body 08-21-2023 chronic.  Pneumonia of both lower lobes due to methicillin susceptible Staphylococcus aureus (MSSA) (HCC) Since admission to 08-20-2023 improved, off pressors.  - CAP, MSSA on trach aspirate. Extubated 12/1 - MSSA on blood cx likely contaminate - Urine cx negative - continue Ancef 7 day course to tx MSSA PNA -cont scheduled duonebs 08-21-2023 continue with IV Ancef. Today is Day #3 of 7 08-22-2023 discussed with pharmacy. Pt had been on IV rocephin from 11-29 through 12-2.  Will count those days of IV rocephin as therapy towards her treatment of MSSA pneumonia. Will continue IV Ancef for a total of 5 days. Today is Day #4 of 5. 08-23-2023 will complete IV ancef today Today Day #5 of 5. Resolved.  History of cardioembolic cerebrovascular accident (CVA) 08-21-2023 chronic.  Overweight (BMI 25.0-29.9) 08-22-2023 BMI 29.81  Pressure injury of skin  - bilateral heels, bilateral buttocks 08-21-2023 present on admission.  Altered mental status 08-21-2023 unclear what pt's baseline is. Pt lives in SNF prior to admission. 08-23-2023 pt is more awake today. Placed into recliner by OT 08-24-2023 pt is stable. This appear at her baseline.  Malnutrition of moderate  degree 08-21-2023 see RD note 08-21-2023. Moderate Malnutrition related to chronic illness as evidenced by mild fat depletion, severe muscle depletion, moderate muscle depletion.   08-22-2023 will stop cortrak. Start calorie counts. Will monitor over weekend to see how much liquids and solids she takes in.  08-23-2023 continue with calorie counts this weekend. Awaiting palliative care consult for GOC discussion in case pt does not take enough liquids/food to sustain herself. 08-24-2023 awaiting calorie counts by RD.  GOC discussion with palliative care today.  Iron deficiency anemia 08-21-2023 Hg 8.1 g/dl today.   Iron studies show iron deficiency. On tube feeds that have iron. Iron/TIBC/Ferritin/ %Sat    Component Value  Date/Time   IRON 20 (L) 08/16/2023 0656   TIBC 204 (L) 08/16/2023 0656   FERRITIN 388 (H) 08/16/2023 0656   IRONPCTSAT 10 (L) 08/16/2023 0656   08-23-2023 will start po nu-iron 150 mg daily  Epilepsy (HCC) 08-21-2023 continue with depakene 08-24-2023 continue with depakene. No breakthrough seizures       DVT prophylaxis: heparin injection 5,000 Units Start: 08/16/23 1400 SCDs Start: 08/16/23 0435     Code Status: Full Code Family Communication: discussed with pt's husband dana via phone Disposition Plan: return to snf Reason for continuing need for hospitalization: medically stable for DC to SNF  Objective: Vitals:   08/24/23 0043 08/24/23 0522 08/24/23 0744 08/24/23 1245  BP: 122/75 120/74 99/72 115/82  Pulse: 99 (!) 107 (!) 106 (!) 108  Resp: 18 18 18 16   Temp: 99.5 F (37.5 C) 99.3 F (37.4 C) 98.2 F (36.8 C) 99.1 F (37.3 C)  TempSrc: Oral Oral Oral Oral  SpO2: 100% 92% 96% 100%  Weight:      Height:        Intake/Output Summary (Last 24 hours) at 08/24/2023 1339 Last data filed at 08/24/2023 0700 Gross per 24 hour  Intake 340.28 ml  Output --  Net 340.28 ml   Filed Weights   08/21/23 0500 08/22/23 0707 08/23/23 0522  Weight: 76.3 kg 73.9 kg 77.7 kg    Examination:  Physical Exam Vitals and nursing note reviewed.  Constitutional:      Appearance: She is not toxic-appearing or diaphoretic.     Comments: Chronically ill appearing  HENT:     Head: Normocephalic and atraumatic.     Nose: Nose normal.     Mouth/Throat:     Comments: macroglossia Cardiovascular:     Rate and Rhythm: Normal rate.  Pulmonary:     Effort: Pulmonary effort is normal.     Breath sounds: Normal breath sounds.  Abdominal:     General: Bowel sounds are normal.     Palpations: Abdomen is soft.  Musculoskeletal:     Comments: Bilateral prevelon boots on both lower legs/feet  Skin:    Capillary Refill: Capillary refill takes less than 2 seconds.  Neurological:     Mental  Status: She is disoriented.     Comments: Non-verbal     Data Reviewed: I have personally reviewed following labs and imaging studies  CBC: Recent Labs  Lab 08/17/23 1531 08/18/23 0221 08/19/23 1117 08/20/23 0821 08/21/23 0729  WBC  --  11.5* 11.1* 6.8 7.0  HGB 8.3* 7.4* 8.3* 9.8* 8.1*  HCT 27.6* 22.6* 25.2* 30.2* 24.9*  MCV  --  73.4* 70.8* 72.1* 71.6*  PLT  --  169 183 212 214   Basic Metabolic Panel: Recent Labs  Lab 08/17/23 1712 08/17/23 2008 08/18/23 0221 08/18/23 1807 08/19/23 0629 08/20/23 0554  08/21/23 0729  NA  --   --  146*  --  148* 139 138  K  --   --  3.3*  --  4.6 3.4* 3.8  CL  --   --  111  --  114* 105 106  CO2  --   --  27  --  24 25 27   GLUCOSE  --   --  157*  --  101* 160* 215*  BUN  --   --  17  --  13 8 9   CREATININE  --   --  0.51  --  0.40* 0.49 0.60  CALCIUM  --   --  7.9*  --  8.3* 8.3* 7.9*  MG 2.3  --  2.2 2.1 2.0 1.6*  --   PHOS 4.5 4.4 3.7 2.8 2.4* 2.1*  --    GFR: Estimated Creatinine Clearance: 73.9 mL/min (by C-G formula based on SCr of 0.6 mg/dL). Liver Function Tests: Recent Labs  Lab 08/21/23 0729  AST 42*  ALT 47*  ALKPHOS 47  BILITOT 0.2  PROT 4.8*  ALBUMIN 2.0*   CBG: Recent Labs  Lab 08/23/23 1147 08/23/23 1726 08/23/23 2023 08/24/23 0801 08/24/23 1244  GLUCAP 292* 179* 259* 152* 206*    Recent Results (from the past 240 hour(s))  Resp panel by RT-PCR (RSV, Flu A&B, Covid) Anterior Nasal Swab     Status: None   Collection Time: 08/16/23  2:05 AM   Specimen: Anterior Nasal Swab  Result Value Ref Range Status   SARS Coronavirus 2 by RT PCR NEGATIVE NEGATIVE Final    Comment: (NOTE) SARS-CoV-2 target nucleic acids are NOT DETECTED.  The SARS-CoV-2 RNA is generally detectable in upper respiratory specimens during the acute phase of infection. The lowest concentration of SARS-CoV-2 viral copies this assay can detect is 138 copies/mL. A negative result does not preclude SARS-Cov-2 infection and should not  be used as the sole basis for treatment or other patient management decisions. A negative result may occur with  improper specimen collection/handling, submission of specimen other than nasopharyngeal swab, presence of viral mutation(s) within the areas targeted by this assay, and inadequate number of viral copies(<138 copies/mL). A negative result must be combined with clinical observations, patient history, and epidemiological information. The expected result is Negative.  Fact Sheet for Patients:  BloggerCourse.com  Fact Sheet for Healthcare Providers:  SeriousBroker.it  This test is no t yet approved or cleared by the [place] FDA and  has been authorized for detection and/or diagnosis of SARS-CoV-2 by FDA under an Emergency Use Authorization (EUA). This EUA will remain  in effect (meaning this test can be used) for the duration of the COVID-19 declaration under Section 564(b)(1) of the Act, 21 U.S.C.section 360bbb-3(b)(1), unless the authorization is terminated  or revoked sooner.       Influenza A by PCR NEGATIVE NEGATIVE Final   Influenza B by PCR NEGATIVE NEGATIVE Final    Comment: (NOTE) The Xpert Xpress SARS-CoV-2/FLU/RSV plus assay is intended as an aid in the diagnosis of influenza from Nasopharyngeal swab specimens and should not be used as a sole basis for treatment. Nasal washings and aspirates are unacceptable for Xpert Xpress SARS-CoV-2/FLU/RSV testing.  Fact Sheet for Patients: BloggerCourse.com  Fact Sheet for Healthcare Providers: SeriousBroker.it  This test is not yet approved or cleared by the [place] FDA and has been authorized for detection and/or diagnosis of SARS-CoV-2 by FDA under an Emergency Use Authorization (EUA). This EUA will remain  in effect (meaning this test can be used) for the duration of the COVID-19 declaration under Section  564(b)(1) of the Act, 21 U.S.C. section 360bbb-3(b)(1), unless the authorization is terminated or revoked.     Resp Syncytial Virus by PCR NEGATIVE NEGATIVE Final    Comment: (NOTE) Fact Sheet for Patients: BloggerCourse.com  Fact Sheet for Healthcare Providers: SeriousBroker.it  This test is not yet approved or cleared by the [place] FDA and has been authorized for detection and/or diagnosis of SARS-CoV-2 by FDA under an Emergency Use Authorization (EUA). This EUA will remain in effect (meaning this test can be used) for the duration of the COVID-19 declaration under Section 564(b)(1) of the Act, 21 U.S.C. section 360bbb-3(b)(1), unless the authorization is terminated or revoked.  Performed at [facility], [address] [place], [place] [address]   Blood Culture (routine x 2)     Status: Abnormal   Collection Time: 08/16/23  2:05 AM   Specimen: BLOOD  Result Value Ref Range Status   Specimen Description   Final    BLOOD BLOOD LEFT HAND Performed at [facility], [address] [place], [place] [address]    Special Requests   Final    BOTTLES DRAWN AEROBIC AND ANAEROBIC Blood Culture adequate volume Performed at [facility], [address], [place], [place] [address]    Culture  Setup Time   Final    GRAM POSITIVE COCCI Gram Stain Report Called to,Read Back By and Verified With: FLYNT,F ON 08/16/23 AT 1850 BY LOY,C AEROBIC AND ANAEROBIC BOTTLES CRITICAL RESULT CALLED TO, READ BACK BY AND VERIFIED WITH: PHARMD J. LEDFORD 08/16/2023 @ 2301 BY AB    Culture (A)  Final    STAPHYLOCOCCUS WARNERI STAPHYLOCOCCUS HOMINIS STAPHYLOCOCCUS EPIDERMIDIS THE SIGNIFICANCE OF ISOLATING THIS ORGANISM FROM A SINGLE SET OF BLOOD CULTURES WHEN MULTIPLE SETS ARE DRAWN IS UNCERTAIN. PLEASE NOTIFY THE MICROBIOLOGY DEPARTMENT WITHIN ONE WEEK IF SPECIATION AND SENSITIVITIES ARE REQUIRED. Performed at [facility], [address], [place], [place] [address]    Report Status 08/19/2023 FINAL  Final  Blood Culture ID Panel (Reflexed)     Status: Abnormal   Collection Time: 08/16/23  2:05 AM  Result Value Ref Range Status   Enterococcus faecalis NOT DETECTED NOT DETECTED Final   Enterococcus Faecium NOT DETECTED NOT DETECTED Final   Listeria monocytogenes NOT DETECTED NOT DETECTED Final   Staphylococcus species DETECTED (A) NOT DETECTED Final    Comment: CRITICAL RESULT CALLED TO, READ BACK BY AND VERIFIED WITH: PHARMD J. LEDFORD 08/16/2023 @ 2301 BY AB    Staphylococcus aureus (BCID) NOT DETECTED NOT DETECTED Final   Staphylococcus epidermidis DETECTED (A) NOT DETECTED Final    Comment: Methicillin (oxacillin) resistant coagulase negative staphylococcus. Possible blood culture contaminant (unless isolated from more than one blood culture draw or clinical case suggests pathogenicity). No antibiotic treatment is indicated for blood  culture contaminants. CRITICAL RESULT CALLED TO, READ BACK BY AND VERIFIED WITH: PHARMD J. LEDFORD 08/16/2023 @ 2301 BY AB    Staphylococcus lugdunensis NOT DETECTED NOT DETECTED Final   Streptococcus species NOT DETECTED NOT DETECTED Final   Streptococcus agalactiae NOT DETECTED NOT DETECTED Final   Streptococcus pneumoniae NOT DETECTED NOT DETECTED Final   Streptococcus pyogenes NOT DETECTED NOT DETECTED Final   A.calcoaceticus-baumannii NOT DETECTED NOT DETECTED Final   Bacteroides fragilis NOT DETECTED NOT DETECTED Final   Enterobacterales NOT DETECTED NOT DETECTED Final   Enterobacter cloacae complex NOT DETECTED NOT DETECTED Final  Escherichia coli NOT DETECTED NOT DETECTED Final   Klebsiella aerogenes NOT DETECTED NOT DETECTED Final   Klebsiella oxytoca NOT DETECTED NOT DETECTED Final   Klebsiella pneumoniae NOT DETECTED NOT DETECTED Final   Proteus species NOT DETECTED NOT DETECTED Final   Salmonella species NOT DETECTED NOT DETECTED Final   Serratia marcescens NOT DETECTED  NOT DETECTED Final   Haemophilus influenzae NOT DETECTED NOT DETECTED Final   Neisseria meningitidis NOT DETECTED NOT DETECTED Final   Pseudomonas aeruginosa NOT DETECTED NOT DETECTED Final   Stenotrophomonas maltophilia NOT DETECTED NOT DETECTED Final   Candida albicans NOT DETECTED NOT DETECTED Final   Candida auris NOT DETECTED NOT DETECTED Final   Candida glabrata NOT DETECTED NOT DETECTED Final   Candida krusei NOT DETECTED NOT DETECTED Final   Candida parapsilosis NOT DETECTED NOT DETECTED Final   Candida tropicalis NOT DETECTED NOT DETECTED Final   Cryptococcus neoformans/gattii NOT DETECTED NOT DETECTED Final   Methicillin resistance mecA/C DETECTED (A) NOT DETECTED Final    Comment: CRITICAL RESULT CALLED TO, READ BACK BY AND VERIFIED WITH: PHARMD J. LEDFORD 08/16/2023 @ 2301 BY AB Performed at [facility], [address], [place], [place] [address]   Blood Culture (routine x 2)     Status: None   Collection Time: 08/16/23  2:10 AM   Specimen: BLOOD  Result Value Ref Range Status   Specimen Description BLOOD BLOOD RIGHT FOREARM  Final   Special Requests   Final    BOTTLES DRAWN AEROBIC AND ANAEROBIC Blood Culture adequate volume   Culture   Final    NO GROWTH 5 DAYS Performed at [facility], [address], [place], [place] [address]    Report Status 08/21/2023 FINAL  Final  MRSA Next Gen by PCR, Nasal     Status: None   Collection Time: 08/16/23  5:23 AM   Specimen: Nasal Mucosa; Nasal Swab  Result Value Ref Range Status   MRSA by PCR Next Gen NOT DETECTED NOT DETECTED Final    Comment: (NOTE) The GeneXpert MRSA Assay (FDA approved for NASAL specimens only), is one component of a comprehensive MRSA colonization surveillance program. It is not intended to diagnose MRSA infection nor to guide or monitor treatment for MRSA infections. Test performance is not FDA approved in patients less than 79 years old. Performed at [facility], [address],  [place], [place] [address]   Culture, Respiratory w Gram Stain     Status: None   Collection Time: 08/16/23  6:27 AM   Specimen: Tracheal Aspirate; Respiratory  Result Value Ref Range Status   Specimen Description TRACHEAL ASPIRATE  Final   Special Requests NONE  Final   Gram Stain   Final    ABUNDANT WBC PRESENT, PREDOMINANTLY PMN FEW GRAM POSITIVE COCCI IN PAIRS IN CLUSTERS Performed at [facility], [address], [place], [place] [address]    Culture FEW STAPHYLOCOCCUS AUREUS  Final   Report Status 08/19/2023 FINAL  Final   Organism ID, Bacteria STAPHYLOCOCCUS AUREUS  Final      Susceptibility   Staphylococcus aureus - MIC*    CIPROFLOXACIN <=0.5 SENSITIVE Sensitive     ERYTHROMYCIN <=0.25 SENSITIVE Sensitive     GENTAMICIN <=0.5 SENSITIVE Sensitive     OXACILLIN 0.5 SENSITIVE Sensitive     TETRACYCLINE <=1 SENSITIVE Sensitive     VANCOMYCIN 1 SENSITIVE Sensitive     TRIMETH/SULFA <=10 SENSITIVE Sensitive     CLINDAMYCIN <=0.25 SENSITIVE Sensitive  RIFAMPIN <=0.5 SENSITIVE Sensitive     Inducible Clindamycin NEGATIVE Sensitive     LINEZOLID 2 SENSITIVE Sensitive     * FEW STAPHYLOCOCCUS AUREUS  Urine Culture (for pregnant, neutropenic or urologic patients or patients with an indwelling urinary catheter)     Status: None   Collection Time: 08/16/23  7:54 AM   Specimen: Urine, Clean Catch  Result Value Ref Range Status   Specimen Description URINE, CLEAN CATCH  Final   Special Requests NONE  Final   Culture   Final    NO GROWTH Performed at [facility], [address], [place], [place] [address]    Report Status 08/17/2023 FINAL  Final     Radiology Studies: No results found.  Scheduled Meds:  aspirin  81 mg Oral Daily   atorvastatin  40 mg Oral Daily   carvedilol  1.56 mg Oral BID   clopidogrel  75 mg Oral Daily   vitamin B-12  1,000 mcg Oral Daily   divalproex  250 mg Oral Daily   And   divalproex  500 mg Oral QHS   feeding supplement  237 mL  Oral BID BM   Gerhardt's butt cream   Topical BID   heparin  5,000 Units Subcutaneous Q8H   insulin aspart  0-20 Units Subcutaneous TID AC & HS   iron polysaccharides  150 mg Oral Daily   ivabradine  5 mg Oral BID WC   leptospermum manuka honey  1 Application Topical Daily   pantoprazole  40 mg Oral QHS   sertraline  50 mg Oral Daily   Continuous Infusions:   LOS: 8 days   Time spent: 40 minutes  Carollee Herter, DO  [facility]  08/24/2023, 1:39 PM

## 2023-08-24 NOTE — Procedures (Signed)
Patient Name: [name]  MRN: [id number]  Epilepsy Attending: Charlsie Quest  Referring Physician/Provider: [name], MD  Date: 08/24/2023 Duration: 43.18 mins  Patient history: 57yo F with h/o epilepsy now with ams getting eeg to evaluate for seizure  Level of alertness: Awake, asleep  AEDs during EEG study: VPA  Technical aspects: This EEG study was done with scalp electrodes positioned according to the 10-20 International system of electrode placement. Electrical activity was reviewed with band pass filter of 1-70Hz , sensitivity of 7 uV/mm, display speed of 23mm/sec with a 60Hz  notched filter applied as appropriate. EEG data were recorded continuously and digitally stored.  Video monitoring was available and reviewed as appropriate.  Description: The posterior dominant rhythm consists of 7 Hz activity of moderate voltage (25-35 uV) seen predominantly in posterior head regions, symmetric and reactive to eye opening and eye closing. Sleep was characterized by vertex waves, sleep spindles (12 to 14 Hz), maximal frontocentral region. EEG showed continuous generalized predominantly 5 to 6 Hz theta slowing admixed with intermittent 2-3Hz  delta slowing. Hyperventilation and photic stimulation were not performed.     ABNORMALITY - Continuous slow, generalized  IMPRESSION: This study is suggestive of moderate diffuse encephalopathy. No seizures or epileptiform discharges were seen throughout the recording.  Lan Entsminger [name]

## 2023-08-24 NOTE — Progress Notes (Signed)
EEG complete - results pending Khaliqdina looking at ST, and deciding if LTM is needed.

## 2023-08-24 NOTE — Progress Notes (Signed)
CT in room as per RN, wait around 30 min before attempting eeg

## 2023-08-24 NOTE — Consult Note (Signed)
Palliative Care Consult Note                                  Date: 08/24/2023   Patient Name: [name]  DOB: 08-30-1966  MRN: [id number]  Age / Sex: 57 y.o., female  PCP: [facility] Referring Physician: Carollee Herter, DO  Reason for Consultation: Establishing goals of care  HPI/Patient Profile: 57 y.o. female  with past medical history of HTN, HLD, CVA (moderate- large R MCA infarct), T2DM, epilepsy, and depression admitted on 08/16/2023 with altered mental status and suspected urosepsis.   Admitted for septic shock, RLL pneumonia, UTI, and acute respiratory failure with hypoxia-- was intubated but now extubated.  Past Medical History:  Diagnosis Date   Allergic rhinitis    Chickenpox    Depression    Diabetes (HCC)    Epilepsy (HCC)    Headache    HTN (hypertension)    Hyperlipidemia    Lacunar stroke, acute (HCC) 07/24/2019   Seizures (HCC)    Stroke (HCC)    Residual L-sided deficits   Urinary incontinence     Subjective:   I have reviewed medical records including EPIC notes, labs and imaging, assessed the patient and then met with the patient's husband Tahina Mckane to discuss diagnosis prognosis, GOC, EOL wishes, disposition and options.  I introduced Palliative Medicine as specialized medical care for people living with serious illness. It focuses on providing relief from symptoms and stress of a serious illness. The goal is to improve quality of life for both the patient and the family.  Today's Discussion: Patient's husband [name] has a good understanding of the patient's chronic and acute illness. He shares that they have been married 30 years. They have six children combined and lived in [place] prior to moving to [facility]. 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 Her husband shares that she had several strokes through 2018. The patient has been a resident at [facility]  in [place] for 5 years. When she moved there they believed it would be temporary and that she might improve her function while there.  Since going to [facility] her functional status has declined. Prior to this admission she was nonverbal but able to feed herself. Her husband notes that she has become more lethargic and had a further decline in functional status since being admitted. We discussed how acutely ill she was and the seriousness of sepsis. We discussed her nutritional status and the reasoning behind the current calorie count.  I shared my concern that the patient will not be able to orally take in enough calories, that she will continue to have functional decline, and her increased risk for further hospitalizations moving forward.  We discussed code status and scope of care. Recommended consideration of DNR status, understanding evidenced-based poor outcomes in similar hospitalized patients, as the cause of the arrest is likely associated with chronic/terminal disease rather than a reversible acute cardio-pulmonary event. Encouraged the patient's husband to consider what scope of care and interventions the patient would want considering her quality of life. [name] would like to discuss these topics with their daughters tonight. For now the patient remains full code and full scope.  Discussed the importance of continued conversation with family and the medical providers regarding overall plan of care and treatment options, ensuring decisions are within the context of the patient's values  and GOCs.  Questions and concerns were addressed. Hard Choices booklet left for review. The family was encouraged to call with questions or concerns. PMT will continue to support holistically. Plan to meet with patient's daughters and husband 08/25/23 to further discuss goals of care.  Review of Systems  Unable to perform ROS   Objective:   Primary Diagnoses: Present on Admission:  Iron deficiency  anemia   Physical Exam Vitals reviewed.  Constitutional:      General: She is awake.     Appearance: She is ill-appearing.  Cardiovascular:     Rate and Rhythm: Tachycardia present.  Pulmonary:     Effort: Pulmonary effort is normal.  Skin:    General: Skin is warm and dry.  Neurological:     Mental Status: She is lethargic.  Psychiatric:        Speech: She is noncommunicative.     Vital Signs:  BP 99/72 (BP Location: Right Arm)   Pulse (!) 106   Temp 98.2 F (36.8 C) (Oral)   Resp 18   Ht 5\' 2"  (1.575 m)   Wt 77.7 kg   SpO2 96%   BMI 31.33 kg/m   Palliative Assessment/Data: 30%    Advanced Care Planning:   Existing Vynca/ACP Documentation: None  Primary Decision Maker: NEXT OF KIN  Code Status/Advance Care Planning: Full code   Assessment & Plan:   SUMMARY OF RECOMMENDATIONS   Full code Full scope Encouraged continued discussion between family re: goals of care  PMT to continue to support- will meet tomorrow at 12 pm with daughters and husband   Discussed with: Dr. Imogene Burn  Time Total: 75 minutes  Thank you for allowing [place] to participate in the care of Shardae Tillery PMT will continue to support holistically.   Signed by: Sarina Ser, NP Palliative Medicine Team  Team Phone # [phone] (Nights/Weekends)  08/24/2023, 11:14 AM

## 2023-08-25 ENCOUNTER — Inpatient Hospital Stay (HOSPITAL_COMMUNITY): Payer: BC Managed Care – PPO

## 2023-08-25 DIAGNOSIS — I69391 Dysphagia following cerebral infarction: Secondary | ICD-10-CM | POA: Diagnosis not present

## 2023-08-25 DIAGNOSIS — A419 Sepsis, unspecified organism: Secondary | ICD-10-CM | POA: Diagnosis not present

## 2023-08-25 DIAGNOSIS — Z66 Do not resuscitate: Secondary | ICD-10-CM

## 2023-08-25 DIAGNOSIS — R4182 Altered mental status, unspecified: Secondary | ICD-10-CM | POA: Diagnosis not present

## 2023-08-25 DIAGNOSIS — J15211 Pneumonia due to Methicillin susceptible Staphylococcus aureus: Secondary | ICD-10-CM | POA: Diagnosis not present

## 2023-08-25 DIAGNOSIS — Z7189 Other specified counseling: Secondary | ICD-10-CM | POA: Diagnosis not present

## 2023-08-25 DIAGNOSIS — Z515 Encounter for palliative care: Secondary | ICD-10-CM | POA: Diagnosis not present

## 2023-08-25 DIAGNOSIS — R6521 Severe sepsis with septic shock: Secondary | ICD-10-CM | POA: Diagnosis not present

## 2023-08-25 LAB — COMPREHENSIVE METABOLIC PANEL
ALT: 52 U/L — ABNORMAL HIGH (ref 0–44)
AST: 51 U/L — ABNORMAL HIGH (ref 15–41)
Albumin: 2.2 g/dL — ABNORMAL LOW (ref 3.5–5.0)
Alkaline Phosphatase: 44 U/L (ref 38–126)
Anion gap: 11 (ref 5–15)
BUN: 17 mg/dL (ref 6–20)
CO2: 25 mmol/L (ref 22–32)
Calcium: 8.8 mg/dL — ABNORMAL LOW (ref 8.9–10.3)
Chloride: 109 mmol/L (ref 98–111)
Creatinine, Ser: 0.66 mg/dL (ref 0.44–1.00)
GFR, Estimated: >60 mL/min (ref >60)
Glucose, Bld: 153 mg/dL — ABNORMAL HIGH (ref 70–99)
Potassium: 3.8 mmol/L (ref 3.5–5.1)
Sodium: 145 mmol/L (ref 135–145)
Total Bilirubin: 0.5 mg/dL (ref <1.2)
Total Protein: 5.3 g/dL — ABNORMAL LOW (ref 6.5–8.1)

## 2023-08-25 LAB — MAGNESIUM: Magnesium: 1.6 mg/dL — ABNORMAL LOW (ref 1.7–2.4)

## 2023-08-25 LAB — CBC WITH DIFFERENTIAL/PLATELET
Abs Immature Granulocytes: 0.07 10*3/uL (ref 0.00–0.07)
Basophils Absolute: 0 10*3/uL (ref 0.0–0.1)
Basophils Relative: 0 %
Eosinophils Absolute: 0.1 10*3/uL (ref 0.0–0.5)
Eosinophils Relative: 1 %
HCT: 26.5 % — ABNORMAL LOW (ref 36.0–46.0)
Hemoglobin: 8.5 g/dL — ABNORMAL LOW (ref 12.0–15.0)
Immature Granulocytes: 1 %
Lymphocytes Relative: 35 %
Lymphs Abs: 2.6 10*3/uL (ref 0.7–4.0)
MCH: 24.1 pg — ABNORMAL LOW (ref 26.0–34.0)
MCHC: 32.1 g/dL (ref 30.0–36.0)
MCV: 75.3 fL — ABNORMAL LOW (ref 80.0–100.0)
Monocytes Absolute: 1.4 10*3/uL — ABNORMAL HIGH (ref 0.1–1.0)
Monocytes Relative: 20 %
Neutro Abs: 3.1 10*3/uL (ref 1.7–7.7)
Neutrophils Relative %: 43 %
Platelets: 265 10*3/uL (ref 150–400)
RBC: 3.52 MIL/uL — ABNORMAL LOW (ref 3.87–5.11)
RDW: 22.5 % — ABNORMAL HIGH (ref 11.5–15.5)
Smear Review: NORMAL
WBC: 7.3 10*3/uL (ref 4.0–10.5)
nRBC: 0 % (ref 0.0–0.2)

## 2023-08-25 LAB — GLUCOSE, CAPILLARY
Glucose-Capillary: 158 mg/dL — ABNORMAL HIGH (ref 70–99)
Glucose-Capillary: 133 mg/dL — ABNORMAL HIGH (ref 70–99)
Glucose-Capillary: 125 mg/dL — ABNORMAL HIGH (ref 70–99)
Glucose-Capillary: 164 mg/dL — ABNORMAL HIGH (ref 70–99)

## 2023-08-25 LAB — BLOOD GAS, ARTERIAL
Acid-Base Excess: 7.1 mmol/L — ABNORMAL HIGH (ref 0.0–2.0)
Bicarbonate: 30.1 mmol/L — ABNORMAL HIGH (ref 20.0–28.0)
O2 Saturation: 97.5 %
Patient temperature: 37.4
pCO2 arterial: 37 mm[Hg] (ref 32–48)
pH, Arterial: 7.52 — ABNORMAL HIGH (ref 7.35–7.45)
pO2, Arterial: 72 mm[Hg] — ABNORMAL LOW (ref 83–108)

## 2023-08-25 NOTE — Progress Notes (Signed)
PROGRESS NOTE    [name]  RUE:[id number] DOB: March 03, 1966 DOA: 08/16/2023 PCP: [facility]  Subjective: Pt seen and examined. Events of last night noted. CT head negative. EEG shows moderate diffuse encephalopathy.  This AM pt with eyes open. Appears the same as yesterday. Had low grade fever 100.6 Cath UA ordered WBC 7.3   Hospital Course: HPI: 57 year old woman who presented to [facility] 11/29 as a transfer from [facility] for AMS, suspected urosepsis. PMHx significant for HTN, HLD, CVA (moderate-large R MCA infarct), T2DM, epilepsy, depression.    Patient is a resident of the [facility]) and had decreased responsiveness throughout the course of the day. EMS noted GCS 6, fever and tachycardia. Labs were notable for WBC 13.4, Hgb 9.8, Plt 286. INR 1.1. Na 156, K 3.9, CO2 27, Cr 0.99 (baseline 0.8-1), mildly elevated transaminases 78/74. LA 1.5 > 4.0. ABG 7.42/46/100/29.2. COVID/Flu/RSV negative. UA +protein, nitrites, trace leuks. CXR without active disease. 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 Patient was intubated at [facility] for airway protection.   PCCM consulted for ICU admission and transfer.  Significant Events: Admitted 08/16/2023 for septic shock RLL PNA, UTI, acute respiratory failure with hypoxia Intubated in ER on 08-16-2023 08-21-2023 Care transferred to Hospitalist service   Significant Labs: Admission WBC 13.4, HgB 9.8, Na 156, BUN 45, Scr 0.99  Significant Imaging Studies: Admission CXR Support apparatus in appropriate position. 2. No active disease.3. Possible mild thoracic aortic aneurysm. This could be confirmed with dedicated CT imaging. Admission CT chest/abd/pelvis consolidative opacity right lower lobe greater than middle and upper lobe. Pneumonias in the differential. Recommend follow-up. No bowel obstruction, free air.  Minimal areas of fluid and stranding. ET tube and enteric tube. 08-19-2023 EEG suggestive of moderate diffuse encephalopathy. No seizures or epileptiform discharges were seen throughout the recording.   Antibiotic Therapy: Anti-infectives (From admission, onward)    Start     Dose/Rate Route Frequency Ordered Stop   08/19/23 1400  ceFAZolin (ANCEF) IVPB 2g/100 mL premix        2 g 200 mL/hr over 30 Minutes Intravenous Every 8 hours 08/19/23 0915 08/23/23 0559   08/17/23 0800  vancomycin (VANCOREADY) IVPB 1250 mg/250 mL  Status:  Discontinued        1,250 mg 166.7 mL/hr over 90 Minutes Intravenous Every 24 hours 08/16/23 1908 08/19/23 0915   08/16/23 2200  cefTRIAXone (ROCEPHIN) 2 g in sodium chloride 0.9 % 100 mL IVPB  Status:  Discontinued        2 g 200 mL/hr over 30 Minutes Intravenous Daily at bedtime 08/16/23 0632 08/19/23 0915   08/16/23 2200  vancomycin (VANCOREADY) IVPB 750 mg/150 mL  Status:  Discontinued        750 mg 150 mL/hr over 60 Minutes Intravenous Every 12 hours 08/16/23 0634 08/16/23 0928   08/16/23 0730  vancomycin (VANCOREADY) IVPB 1250 mg/250 mL        1,250 mg 166.7 mL/hr over 90 Minutes Intravenous  Once 08/16/23 0630 08/16/23 0939   08/16/23 0715  azithromycin (ZITHROMAX) 500 mg in sodium chloride 0.9 % 250 mL IVPB  Status:  Discontinued        500 mg 250 mL/hr over 60 Minutes Intravenous Every 24 hours 08/16/23 0628 08/16/23 0629   08/16/23 0645  azithromycin (ZITHROMAX) 500 mg in sodium chloride 0.9 % 250 mL IVPB        500 mg 250 mL/hr  over 60 Minutes Intravenous Daily 08/16/23 0629 08/18/23 1148   08/16/23 0215  cefTRIAXone (ROCEPHIN) 2 g in sodium chloride 0.9 % 100 mL IVPB        2 g 200 mL/hr over 30 Minutes Intravenous  Once 08/16/23 0206 08/16/23 0246       Procedures: 08-16-2023 endotracheal intubation 08-18-2023 extubation 08-19-2023 EEG suggestive of moderate diffuse encephalopathy. No seizures or epileptiform discharges were seen throughout  the recording.  08-19-2023 Cortrak placement  Consultants: PCCM    Assessment and Plan: * Septic shock (HCC) Since admission to 08-20-2023 improved, off pressors.  - CAP, MSSA on trach aspirate. Extubated 12/1 - MSSA on blood cx likely contaminate - Urine cx negative - continue Ancef 7 day course to tx MSSA PNA -cont scheduled duonebs  08-22-2023 resolved by the time she was transferred out of ICU on 08-20-2023  Dysphagia as late effect of cerebrovascular accident (CVA) 08-21-2023 Pt with a history of dyspahgia after CVA in 2021, last clinical evaluation recommended dys 2/nectar.  Seen by ST.  Currently has cortrak.  08-22-2023 Discussed with ST and RD. Discussed with husband. Pt is safe to eat with dyphagia diet and nectar thick liquids. Will pull cortrak and start calorie counts.   Will see this weekend if pt can maintain hydration and enough po intake.   Discussed with husband that every time pt gets admitted to hospital, she gets weaker and weaker. She nearly died this admission due to septic shock. Discussed with husband that keeping pt at same level of function should be expectation rather than pt will improve.   Discussed PEG tube and whether this would be consider by some people as artificial nutrition/life support.   Husband states they have been married for 37 years.   Discussed with him that if pt unable to take enough liquids and solid to stay hydrate and nourished, he will need to consider PEG tube.  This would not prevent aspiration pneumonia and may prolong her suffering. I asked pt to think about pt's life and their wishes/dreams as they got older.  Will enlist palliative care to help husband with goals of care. 08-23-2023 cortrak removed. Calorie counts started yesterday. Will see how much she can eat and drink this weekend. Awaiting palliative care consult. 08-24-2023 calorie counts to finish today. Pt eating enough in my estimation but will see what the expert dietician  says.  08-25-2023 awaiting calorie counts by RD. Restart dysphagia-1 diet with nectar thick liquids.  Weakness of left side of body 08-21-2023 chronic.  Pneumonia of both lower lobes due to methicillin susceptible Staphylococcus aureus (MSSA) (HCC) Since admission to 08-20-2023 improved, off pressors.  - CAP, MSSA on trach aspirate. Extubated 12/1 - MSSA on blood cx likely contaminate - Urine cx negative - continue Ancef 7 day course to tx MSSA PNA -cont scheduled duonebs 08-21-2023 continue with IV Ancef. Today is Day #3 of 7 08-22-2023 discussed with pharmacy. Pt had been on IV rocephin from 11-29 through 12-2.  Will count those days of IV rocephin as therapy towards her treatment of MSSA pneumonia. Will continue IV Ancef for a total of 5 days. Today is Day #4 of 5. 08-23-2023 will complete IV ancef today Today Day #5 of 5. Resolved.  History of cardioembolic cerebrovascular accident (CVA) 08-21-2023 chronic.  Overweight (BMI 25.0-29.9) 08-22-2023 BMI 29.81  Pressure injury of skin  - bilateral heels, bilateral buttocks 08-21-2023 present on admission.  Altered mental status 08-21-2023 unclear what pt's baseline is. Pt lives in  SNF prior to admission. 08-23-2023 pt is more awake today. Placed into recliner by OT 08-24-2023 pt is stable. This appear at her baseline. 08-25-2023 appeared to have a "spell" last night. No seizures noted. CT head negative for new CVA. EEG shows moderate diffuse encephalopathy.  Pt has been made DNR by pt's husband per palliative care service.  Malnutrition of moderate degree 08-21-2023 see RD note 08-21-2023. Moderate Malnutrition related to chronic illness as evidenced by mild fat depletion, severe muscle depletion, moderate muscle depletion.   08-22-2023 will stop cortrak. Start calorie counts. Will monitor over weekend to see how much liquids and solids she takes in.  08-23-2023 continue with calorie counts this weekend. Awaiting palliative care consult for GOC  discussion in case pt does not take enough liquids/food to sustain herself. 08-24-2023 awaiting calorie counts by RD.  GOC discussion with palliative care today.  08-25-2023 discussed with palliative care. Per my discussion with palliative care, pt's husband would never want pt to have PEG.  Iron deficiency anemia 08-21-2023 Hg 8.1 g/dl today.   Iron studies show iron deficiency. On tube feeds that have iron. Iron/TIBC/Ferritin/ %Sat    Component Value Date/Time   IRON 20 (L) 08/16/2023 0656   TIBC 204 (L) 08/16/2023 0656   FERRITIN 388 (H) 08/16/2023 0656   IRONPCTSAT 10 (L) 08/16/2023 0656   08-23-2023 will start po nu-iron 150 mg daily  Epilepsy (HCC) 08-21-2023 continue with depakene 08-24-2023 continue with depakene. No breakthrough seizures       DVT prophylaxis: heparin injection 5,000 Units Start: 08/16/23 1400 SCDs Start: 08/16/23 0435    Code Status: Limited: Do not attempt resuscitation (DNR) -DNR-LIMITED -Do Not Intubate/DNI  Family Communication: no family at bedside. Palliative care has met with pt's husband. Pt made DNR/DNI. Disposition Plan: return to SNF Reason for continuing need for hospitalization: stable for DC back to SNF.  Objective: Vitals:   08/24/23 2010 08/24/23 2042 08/25/23 0819 08/25/23 1258  BP: (!) 109/58 107/66 129/88 109/73  Pulse: (!) 103 95 95 93  Resp: 18 20 17 17   Temp: (!) 100.6 F (38.1 C) 99.4 F (37.4 C) 98.3 F (36.8 C) 98 F (36.7 C)  TempSrc: Oral Oral Oral Oral  SpO2: 96% 100% 100% 100%  Weight:      Height:        Intake/Output Summary (Last 24 hours) at 08/25/2023 1326 Last data filed at 08/25/2023 1203 Gross per 24 hour  Intake 370 ml  Output 900 ml  Net -530 ml   Filed Weights   08/21/23 0500 08/22/23 0707 08/23/23 0522  Weight: 76.3 kg 73.9 kg 77.7 kg    Examination:  Physical Exam Vitals and nursing note reviewed.  Constitutional:      Comments: Appears similar as yesterday. Eyes open but non-verbal. Does  not follow commands  HENT:     Head: Normocephalic and atraumatic.  Cardiovascular:     Rate and Rhythm: Normal rate and regular rhythm.  Pulmonary:     Effort: Pulmonary effort is normal.     Breath sounds: Normal breath sounds.  Abdominal:     General: Bowel sounds are normal.  Skin:    General: Skin is warm and dry.     Capillary Refill: Capillary refill takes less than 2 seconds.  Neurological:     Comments: Eyes are open. Non-verbal. Does not follow commands.     Data Reviewed: I have personally reviewed following labs and imaging studies  CBC: Recent Labs  Lab 08/19/23 1117  08/20/23 0821 08/21/23 0729 08/24/23 2106 08/25/23 0713  WBC 11.1* 6.8 7.0 7.6 7.3  NEUTROABS  --   --   --   --  3.1  HGB 8.3* 9.8* 8.1* 8.0* 8.5*  HCT 25.2* 30.2* 24.9* 24.9* 26.5*  MCV 70.8* 72.1* 71.6* 73.0* 75.3*  PLT 183 212 214 302 265   Basic Metabolic Panel: Recent Labs  Lab 08/18/23 1807 08/19/23 0629 08/20/23 0554 08/21/23 0729 08/24/23 2106 08/25/23 0713  NA  --  148* 139 138 142 145  K  --  4.6 3.4* 3.8 3.8 3.8  CL  --  114* 105 106 107 109  CO2  --  24 25 27 27 25   GLUCOSE  --  101* 160* 215* 199* 153*  BUN  --  13 8 9  21* 17  CREATININE  --  0.40* 0.49 0.60 0.69 0.66  CALCIUM  --  8.3* 8.3* 7.9* 8.7* 8.8*  MG 2.1 2.0 1.6*  --   --  1.6*  PHOS 2.8 2.4* 2.1*  --   --   --    GFR: Estimated Creatinine Clearance: 73.9 mL/min (by C-G formula based on SCr of 0.66 mg/dL). Liver Function Tests: Recent Labs  Lab 08/21/23 0729 08/24/23 2106 08/25/23 0713  AST 42* 82* 51*  ALT 47* 65* 52*  ALKPHOS 47 55 44  BILITOT 0.2 0.4 0.5  PROT 4.8* 5.1* 5.3*  ALBUMIN 2.0* 2.1* 2.2*    Recent Labs  Lab 08/24/23 2106  AMMONIA 26   CBG: Recent Labs  Lab 08/24/23 1621 08/24/23 2012 08/24/23 2305 08/25/23 0821 08/25/23 1302  GLUCAP 249* 217* 168* 158* 133*    Recent Results (from the past 240 hour(s))  Resp panel by RT-PCR (RSV, Flu A&B, Covid) Anterior Nasal Swab      Status: None   Collection Time: 08/16/23  2:05 AM   Specimen: Anterior Nasal Swab  Result Value Ref Range Status   SARS Coronavirus 2 by RT PCR NEGATIVE NEGATIVE Final    Comment: (NOTE) SARS-CoV-2 target nucleic acids are NOT DETECTED.  The SARS-CoV-2 RNA is generally detectable in upper respiratory specimens during the acute phase of infection. The lowest concentration of SARS-CoV-2 viral copies this assay can detect is 138 copies/mL. A negative result does not preclude SARS-Cov-2 infection and should not be used as the sole basis for treatment or other patient management decisions. A negative result may occur with  improper specimen collection/handling, submission of specimen other than nasopharyngeal swab, presence of viral mutation(s) within the areas targeted by this assay, and inadequate number of viral copies(<138 copies/mL). A negative result must be combined with clinical observations, patient history, and epidemiological information. The expected result is Negative.  Fact Sheet for Patients:  BloggerCourse.com  Fact Sheet for Healthcare Providers:  SeriousBroker.it  This test is no t yet approved or cleared by the [place] FDA and  has been authorized for detection and/or diagnosis of SARS-CoV-2 by FDA under an Emergency Use Authorization (EUA). This EUA will remain  in effect (meaning this test can be used) for the duration of the COVID-19 declaration under Section 564(b)(1) of the Act, 21 U.S.C.section 360bbb-3(b)(1), unless the authorization is terminated  or revoked sooner.       Influenza A by PCR NEGATIVE NEGATIVE Final   Influenza B by PCR NEGATIVE NEGATIVE Final    Comment: (NOTE) The Xpert Xpress SARS-CoV-2/FLU/RSV plus assay is intended as an aid in the diagnosis of influenza from Nasopharyngeal swab specimens and should not be  used as a sole basis for treatment. Nasal washings and aspirates are  unacceptable for Xpert Xpress SARS-CoV-2/FLU/RSV testing.  Fact Sheet for Patients: BloggerCourse.com  Fact Sheet for Healthcare Providers: SeriousBroker.it  This test is not yet approved or cleared by the [place] FDA and has been authorized for detection and/or diagnosis of SARS-CoV-2 by FDA under an Emergency Use Authorization (EUA). This EUA will remain in effect (meaning this test can be used) for the duration of the COVID-19 declaration under Section 564(b)(1) of the Act, 21 U.S.C. section 360bbb-3(b)(1), unless the authorization is terminated or revoked.     Resp Syncytial Virus by PCR NEGATIVE NEGATIVE Final    Comment: (NOTE) Fact Sheet for Patients: BloggerCourse.com  Fact Sheet for Healthcare Providers: SeriousBroker.it  This test is not yet approved or cleared by the [place] FDA and has been authorized for detection and/or diagnosis of SARS-CoV-2 by FDA under an Emergency Use Authorization (EUA). This EUA will remain in effect (meaning this test can be used) for the duration of the COVID-19 declaration under Section 564(b)(1) of the Act, 21 U.S.C. section 360bbb-3(b)(1), unless the authorization is terminated or revoked.  Performed at [facility], [address] [place], [place] [address]   Blood Culture (routine x 2)     Status: Abnormal   Collection Time: 08/16/23  2:05 AM   Specimen: BLOOD  Result Value Ref Range Status   Specimen Description   Final    BLOOD BLOOD LEFT HAND Performed at [facility], [address], [place], [place] [address]    Special Requests   Final    BOTTLES DRAWN AEROBIC AND ANAEROBIC Blood Culture adequate volume Performed at [facility], [address] [place], [place] [address]    Culture  Setup Time   Final    GRAM POSITIVE COCCI Gram Stain Report Called to,Read Back By and Verified With: FLYNT,F ON 08/16/23 AT 1850  BY LOY,C AEROBIC AND ANAEROBIC BOTTLES CRITICAL RESULT CALLED TO, READ BACK BY AND VERIFIED WITH: PHARMD J. LEDFORD 08/16/2023 @ 2301 BY AB    Culture (A)  Final    STAPHYLOCOCCUS WARNERI STAPHYLOCOCCUS HOMINIS STAPHYLOCOCCUS EPIDERMIDIS THE SIGNIFICANCE OF ISOLATING THIS ORGANISM FROM A SINGLE SET OF BLOOD CULTURES WHEN MULTIPLE SETS ARE DRAWN IS UNCERTAIN. PLEASE NOTIFY THE MICROBIOLOGY DEPARTMENT WITHIN ONE WEEK IF SPECIATION AND SENSITIVITIES ARE REQUIRED. Performed at [facility], [address] [place], [place] [address]    Report Status 08/19/2023 FINAL  Final  Blood Culture ID Panel (Reflexed)     Status: Abnormal   Collection Time: 08/16/23  2:05 AM  Result Value Ref Range Status   Enterococcus faecalis NOT DETECTED NOT DETECTED Final   Enterococcus Faecium NOT DETECTED NOT DETECTED Final   Listeria monocytogenes NOT DETECTED NOT DETECTED Final   Staphylococcus species DETECTED (A) NOT DETECTED Final    Comment: CRITICAL RESULT CALLED TO, READ BACK BY AND VERIFIED WITH: PHARMD J. LEDFORD 08/16/2023 @ 2301 BY AB    Staphylococcus aureus (BCID) NOT DETECTED NOT DETECTED Final   Staphylococcus epidermidis DETECTED (A) NOT DETECTED Final    Comment: Methicillin (oxacillin) resistant coagulase negative staphylococcus. Possible blood culture contaminant (unless isolated from more than one blood culture draw or clinical case suggests pathogenicity). No antibiotic treatment is indicated for blood  culture contaminants. CRITICAL RESULT CALLED TO, READ BACK BY AND VERIFIED WITH: PHARMD J. LEDFORD 08/16/2023 @ 2301 BY AB    Staphylococcus lugdunensis NOT DETECTED NOT DETECTED Final   Streptococcus species NOT DETECTED  NOT DETECTED Final   Streptococcus agalactiae NOT DETECTED NOT DETECTED Final   Streptococcus pneumoniae NOT DETECTED NOT DETECTED Final   Streptococcus pyogenes NOT DETECTED NOT DETECTED Final   A.calcoaceticus-baumannii NOT DETECTED NOT DETECTED Final    Bacteroides fragilis NOT DETECTED NOT DETECTED Final   Enterobacterales NOT DETECTED NOT DETECTED Final   Enterobacter cloacae complex NOT DETECTED NOT DETECTED Final   Escherichia coli NOT DETECTED NOT DETECTED Final   Klebsiella aerogenes NOT DETECTED NOT DETECTED Final   Klebsiella oxytoca NOT DETECTED NOT DETECTED Final   Klebsiella pneumoniae NOT DETECTED NOT DETECTED Final   Proteus species NOT DETECTED NOT DETECTED Final   Salmonella species NOT DETECTED NOT DETECTED Final   Serratia marcescens NOT DETECTED NOT DETECTED Final   Haemophilus influenzae NOT DETECTED NOT DETECTED Final   Neisseria meningitidis NOT DETECTED NOT DETECTED Final   Pseudomonas aeruginosa NOT DETECTED NOT DETECTED Final   Stenotrophomonas maltophilia NOT DETECTED NOT DETECTED Final   Candida albicans NOT DETECTED NOT DETECTED Final   Candida auris NOT DETECTED NOT DETECTED Final   Candida glabrata NOT DETECTED NOT DETECTED Final   Candida krusei NOT DETECTED NOT DETECTED Final   Candida parapsilosis NOT DETECTED NOT DETECTED Final   Candida tropicalis NOT DETECTED NOT DETECTED Final   Cryptococcus neoformans/gattii NOT DETECTED NOT DETECTED Final   Methicillin resistance mecA/C DETECTED (A) NOT DETECTED Final    Comment: CRITICAL RESULT CALLED TO, READ BACK BY AND VERIFIED WITH: PHARMD J. LEDFORD 08/16/2023 @ 2301 BY AB Performed at [facility], [address] [place], [place] [address]   Blood Culture (routine x 2)     Status: None   Collection Time: 08/16/23  2:10 AM   Specimen: BLOOD  Result Value Ref Range Status   Specimen Description BLOOD BLOOD RIGHT FOREARM  Final   Special Requests   Final    BOTTLES DRAWN AEROBIC AND ANAEROBIC Blood Culture adequate volume   Culture   Final    NO GROWTH 5 DAYS Performed at [facility], [address], [place], [place] [address]    Report Status 08/21/2023 FINAL  Final  MRSA Next Gen by PCR, Nasal     Status: None   Collection Time: 08/16/23  5:23  AM   Specimen: Nasal Mucosa; Nasal Swab  Result Value Ref Range Status   MRSA by PCR Next Gen NOT DETECTED NOT DETECTED Final    Comment: (NOTE) The GeneXpert MRSA Assay (FDA approved for NASAL specimens only), is one component of a comprehensive MRSA colonization surveillance program. It is not intended to diagnose MRSA infection nor to guide or monitor treatment for MRSA infections. Test performance is not FDA approved in patients less than 33 years old. Performed at [facility], [address] [place], [place] [address]   Culture, Respiratory w Gram Stain     Status: None   Collection Time: 08/16/23  6:27 AM   Specimen: Tracheal Aspirate; Respiratory  Result Value Ref Range Status   Specimen Description TRACHEAL ASPIRATE  Final   Special Requests NONE  Final   Gram Stain   Final    ABUNDANT WBC PRESENT, PREDOMINANTLY PMN FEW GRAM POSITIVE COCCI IN PAIRS IN CLUSTERS Performed at [facility], [address] [place], [place] [address]    Culture FEW STAPHYLOCOCCUS AUREUS  Final   Report Status 08/19/2023 FINAL  Final   Organism ID, Bacteria STAPHYLOCOCCUS AUREUS  Final      Susceptibility   Staphylococcus  aureus - MIC*    CIPROFLOXACIN <=0.5 SENSITIVE Sensitive     ERYTHROMYCIN <=0.25 SENSITIVE Sensitive     GENTAMICIN <=0.5 SENSITIVE Sensitive     OXACILLIN 0.5 SENSITIVE Sensitive     TETRACYCLINE <=1 SENSITIVE Sensitive     VANCOMYCIN 1 SENSITIVE Sensitive     TRIMETH/SULFA <=10 SENSITIVE Sensitive     CLINDAMYCIN <=0.25 SENSITIVE Sensitive     RIFAMPIN <=0.5 SENSITIVE Sensitive     Inducible Clindamycin NEGATIVE Sensitive     LINEZOLID 2 SENSITIVE Sensitive     * FEW STAPHYLOCOCCUS AUREUS  Urine Culture (for pregnant, neutropenic or urologic patients or patients with an indwelling urinary catheter)     Status: None   Collection Time: 08/16/23  7:54 AM   Specimen: Urine, Clean Catch  Result Value Ref Range Status   Specimen Description URINE, CLEAN CATCH   Final   Special Requests NONE  Final   Culture   Final    NO GROWTH Performed at [facility], [address] [place], [place] [address]    Report Status 08/17/2023 FINAL  Final     Radiology Studies: CT HEAD WO CONTRAST ( )  Result Date: 08/25/2023 CLINICAL DATA:  Altered mental status EXAM: CT HEAD WITHOUT CONTRAST TECHNIQUE: Contiguous axial images were obtained from the base of the skull through the vertex without intravenous contrast. RADIATION DOSE REDUCTION: This exam was performed according to the departmental dose-optimization program which includes automated exposure control, adjustment of the mA and/or kV according to patient size and/or use of iterative reconstruction technique. COMPARISON:  08/16/2023 FINDINGS: Brain: No evidence of acute infarction, hemorrhage, hydrocephalus, extra-axial collection or mass lesion/mass effect. Encephalomalacic changes related to prior right MCA distribution infarct. Subcortical white matter and periventricular small vessel ischemic changes. Global cortical and central atrophy. Secondary ventricular prominence. Vascular: Intracranial atherosclerosis. Skull: Normal. Negative for fracture or focal lesion. Sinuses/Orbits: The visualized paranasal sinuses are essentially clear. The mastoid air cells are unopacified. Other: None. IMPRESSION: No acute intracranial abnormality. Prior right MCA distribution infarct. Atrophy with small vessel ischemic changes. Electronically Signed   By: Charline Bills M.D.   On: 08/25/2023 00:37   EEG adult  Result Date: 08/24/2023 Charlsie Quest, MD     08/24/2023 11:15 PM Patient Name: [name] MRN: [id number] Epilepsy Attending: Charlsie Quest Referring Physician/Provider: [name], MD Date: 08/24/2023 Duration: 43.18 mins Patient history: 58yo F with h/o epilepsy now with ams getting eeg to evaluate for seizure Level of alertness: Awake, asleep AEDs during EEG study: VPA Technical aspects: This EEG  study was done with scalp electrodes positioned according to the 10-20 International system of electrode placement. Electrical activity was reviewed with band pass filter of 1-70Hz , sensitivity of 7 uV/mm, display speed of 15mm/sec with a 60Hz  notched filter applied as appropriate. EEG data were recorded continuously and digitally stored.  Video monitoring was available and reviewed as appropriate. Description: The posterior dominant rhythm consists of 7 Hz activity of moderate voltage (25-35 uV) seen predominantly in posterior head regions, symmetric and reactive to eye opening and eye closing. Sleep was characterized by vertex waves, sleep spindles (12 to 14 Hz), maximal frontocentral region. EEG showed continuous generalized predominantly 5 to 6 Hz theta slowing admixed with intermittent 2-3Hz  delta slowing. Hyperventilation and photic stimulation were not performed.   ABNORMALITY - Continuous slow, generalized IMPRESSION: This study is suggestive of moderate diffuse encephalopathy. No seizures or epileptiform discharges were seen throughout the recording. Priyanka [name]   DG  CHEST PORT 1 VIEW  Result Date: 08/24/2023 CLINICAL DATA:  Short of breath EXAM: PORTABLE CHEST 1 VIEW COMPARISON:  08/17/2023 FINDINGS: Single frontal view of the chest demonstrates stable enlargement of the cardiac silhouette. No airspace disease, effusion, or pneumothorax. No acute bony abnormalities. IMPRESSION: 1. No acute intrathoracic process. Electronically Signed   By: Sharlet Salina M.D.   On: 08/24/2023 21:11    Scheduled Meds:  aspirin  81 mg Oral Daily   atorvastatin  40 mg Oral Daily   carvedilol  1.56 mg Oral BID   clopidogrel  75 mg Oral Daily   vitamin B-12  1,000 mcg Oral Daily   feeding supplement  237 mL Oral BID BM   Gerhardt's butt cream   Topical BID   heparin  5,000 Units Subcutaneous Q8H   insulin aspart  0-20 Units Subcutaneous TID AC & HS   iron polysaccharides  150 mg Oral Daily   ivabradine  5  mg Oral BID WC   leptospermum manuka honey  1 Application Topical Daily   pantoprazole  40 mg Oral QHS   sertraline  50 mg Oral Daily   Continuous Infusions:  valproate sodium 250 mg (08/25/23 0909)   valproate sodium Stopped (08/25/23 0200)     LOS: 9 days   Time spent: 40 minutes  Carollee Herter, DO  [facility]  08/25/2023, 1:26 PM

## 2023-08-25 NOTE — Progress Notes (Signed)
Daily Progress Note   Patient Name: [name]       Date: 08/25/2023 DOB: 1965/12/16  Age: 57 y.o. MRN#: [id number] Attending Physician: Carollee Herter, DO Primary Care Physician: [facility] Admit Date: 08/16/2023  Reason for Consultation/Follow-up: Establishing goals of care  Length of Stay: 9  Current Medications: Scheduled Meds:   aspirin  81 mg Oral Daily   atorvastatin  40 mg Oral Daily   carvedilol  1.56 mg Oral BID   clopidogrel  75 mg Oral Daily   vitamin B-12  1,000 mcg Oral Daily   feeding supplement  237 mL Oral BID BM   Gerhardt's butt cream   Topical BID   heparin  5,000 Units Subcutaneous Q8H   insulin aspart  0-20 Units Subcutaneous TID AC & HS   iron polysaccharides  150 mg Oral Daily   ivabradine  5 mg Oral BID WC   leptospermum manuka honey  1 Application Topical Daily   pantoprazole  40 mg Oral QHS   sertraline  50 mg Oral Daily    Continuous Infusions:  valproate sodium 250 mg (08/25/23 0909)   valproate sodium Stopped (08/25/23 0200)    PRN Meds: acetaminophen, hydrALAZINE, ipratropium-albuterol, metoprolol tartrate, mouth rinse  Physical Exam Vitals reviewed.  Constitutional:      General: She is sleeping.     Appearance: She is ill-appearing.  Cardiovascular:     Rate and Rhythm: Normal rate.  Pulmonary:     Effort: Pulmonary effort is normal.  Skin:    General: Skin is warm and dry.             Vital Signs: BP 109/73 (BP Location: Right Arm)   Pulse 93   Temp 98 F (36.7 C) (Oral)   Resp 17   Ht 5\' 2"  (1.575 m)   Wt 77.7 kg   SpO2 100%   BMI 31.33 kg/m  SpO2: SpO2: 100 % O2 Device: O2 Device: Nasal Cannula O2 Flow Rate: O2 Flow Rate (L/min): 2 L/min       Palliative Assessment/Data:  20%      Patient Active Problem List   Diagnosis Date Noted   Overweight (BMI 25.0-29.9) 08/22/2023   Pressure injury of skin  - bilateral heels, bilateral buttocks 08/21/2023   Dysphagia as late effect of cerebrovascular  accident (CVA) 08/21/2023   Malnutrition of moderate degree 08/19/2023   Altered mental status 08/19/2023   Pneumonia of both lower lobes due to methicillin susceptible Staphylococcus aureus (MSSA) (HCC) 08/19/2023   Septic shock (HCC) 08/17/2023   Iron deficiency anemia    Weakness 12/20/2019   Hemiparesis affecting left side as late effect of cerebrovascular accident (CVA) (HCC) 12/20/2019   Type 2 diabetes mellitus with hyperlipidemia (HCC) 12/20/2019   Seizure disorder as sequela of cerebrovascular accident (HCC) 12/20/2019   Urinary incontinence 12/20/2019   Self-care deficit 12/20/2019   Fall    Seizure (HCC) 07/24/2019   Cerebral infarction (HCC) 08/14/2016   Recurrent cerebrovascular accidents (CVAs) (HCC) 08/14/2016   Right foot infection 05/11/2016   Foot abscess, right 05/11/2016   Anxiety and depression 12/02/2015   Cerebral infarction (HCC) 11/27/2015   Epilepsy (HCC) 06/17/2015   Complicated migraine 06/14/2015   Headache 05/19/2015   Weakness of left side of body 05/19/2015   History of cardioembolic cerebrovascular accident (CVA) 05/19/2015   Morbid obesity with BMI of 40.0-44.9, adult (HCC) 03/19/2014   Essential hypertension    Diabetes mellitus without complication (HCC)    Hyperlipidemia    [name] (transient ischemic attack) 03/17/2014    Palliative Care Assessment & Plan   Patient Profile: 57 y.o. female  with past medical history of HTN, HLD, CVA (moderate- large R MCA infarct), T2DM, epilepsy, and depression admitted on 08/16/2023 with altered mental status and suspected urosepsis.    Admitted for septic shock, RLL pneumonia, UTI, and acute respiratory failure with hypoxia-- was intubated but now extubated.  Today's  Discussion: Since the patient is unable to participate in goals of care conversation, I met at the patient's bedside with her husband, two daughters, and brother-in-law. We discussed a brief life review of the patient and then focused on her current illness. The family shares the patient was an active and vibrant person before her strokes. After her strokes they feel she began to give up and that was the start of her functional decline. The family all agree she would not want her current quality of life if she were able to make decisions.   We discussed the importance of considering her quality of life, overall suffering, and  what would be acceptable to her in the future. We discussed code status and scope of care. Recommended consideration of DNR status, understanding evidenced-based poor outcomes in similar hospitalized patients, as the cause of the arrest is likely associated with chronic/terminal disease rather than a reversible acute cardio-pulmonary event. Encouraged the patient's family to consider what scope of care and interventions the patient would want considering her quality of life. The family decided to change the patient's code status to DNR. The family considered transitioning the patient to comfort care now, but after talking it through decided to continue to treat the treatable with a limited scope of treatment. They have agreed to no intubation and no feeding tube ever. They discuss her returning to [facility] and understand the patient is at high risk for further decline and rehospitalization. They will likely transition her to comfort care when another acute illness occurs.    We completed a MOST form today. The family outlined their wishes for the following treatment decisions:  Cardiopulmonary Resuscitation: Do Not Attempt Resuscitation (DNR/No CPR)  Medical Interventions: Limited Additional Interventions: Use medical treatment, IV fluids and cardiac monitoring as indicated, DO NOT  USE intubation or mechanical ventilation. May consider use of less invasive airway support such  as BiPAP or CPAP. Also provide comfort measures. Transfer to the hospital if indicated. Avoid intensive care.   Antibiotics: Determine use of limitation of antibiotics when infection occurs  IV Fluids: IV fluids for a defined trial period  Feeding Tube: No feeding tube   The family is agreeable to outpatient palliative care.   Encouraged family to call with questions or concerns.    Recommendations/Plan: Changed to DNR/DNI Continue to treat the treatable No feeding tube ever MOST form completed- copy placed in chart and will be scanned into [employer] Consult for outpatient palliative care follow up Family discussed transitioning the patient to comfort care when another acute event occurs Continued PMT support    Code Status:    Code Status Orders  (From admission, onward)           Start     Ordered   08/25/23 1311  Do not attempt resuscitation (DNR)- Limited -Do Not Intubate (DNI)  (Code Status)  Continuous       Question Answer Comment  If pulseless and not breathing No CPR or chest compressions.   In Pre-Arrest Conditions (Patient Is Breathing and Has A Pulse) Do not intubate. Provide all appropriate non-invasive medical interventions. Avoid ICU transfer unless indicated or required.   Consent: Discussion documented in EHR or advanced directives reviewed      08/25/23 1310         Extensive chart review has been completed prior to seeing the patient and meeting with her family including labs, vital signs, imaging, progress/consult notes, orders, medications, and available advance directive documents.  Care plan was discussed with bedside RN and Dr. Imogene Burn  Time: 65 minutes  Thank you for allowing the Palliative Medicine Team to assist in the care of this patient.     Sherryll Burger, NP  Please contact Palliative Medicine Team phone at [phone] for questions and concerns.

## 2023-08-25 NOTE — Progress Notes (Signed)
This nurse received a report from the day shift, stating that the patient was responsive to voice during the day. Upon assessing the patient at 20:30, this nurse found that the patient was no longer responsive to voice. The charge nurse was called to the bedside for further evaluation. The patient was only responsive to painful stimuli. The provider was notified at 20:35 and arrived at the bedside by 20:40. Rapid response nurse was alerted at 20:48. New orders were entered. Will continue to monitor

## 2023-08-25 NOTE — Assessment & Plan Note (Signed)
The family outlined their wishes for the following treatment decisions:   Cardiopulmonary Resuscitation: Do Not Attempt Resuscitation (DNR/No CPR)  Medical Interventions: Limited Additional Interventions: Use medical treatment, IV fluids and cardiac monitoring as indicated, DO NOT USE intubation or mechanical ventilation. May consider use of less invasive airway support such as BiPAP or CPAP. Also provide comfort measures. Transfer to the hospital if indicated. Avoid intensive care.   Antibiotics: Determine use of limitation of antibiotics when infection occurs  IV Fluids: IV fluids for a defined trial period  Feeding Tube: No feeding tube

## 2023-08-26 DIAGNOSIS — J15211 Pneumonia due to Methicillin susceptible Staphylococcus aureus: Secondary | ICD-10-CM | POA: Diagnosis not present

## 2023-08-26 DIAGNOSIS — A419 Sepsis, unspecified organism: Secondary | ICD-10-CM | POA: Diagnosis not present

## 2023-08-26 DIAGNOSIS — R531 Weakness: Secondary | ICD-10-CM | POA: Diagnosis not present

## 2023-08-26 DIAGNOSIS — Z66 Do not resuscitate: Secondary | ICD-10-CM

## 2023-08-26 DIAGNOSIS — I69391 Dysphagia following cerebral infarction: Secondary | ICD-10-CM | POA: Diagnosis not present

## 2023-08-26 DIAGNOSIS — E440 Moderate protein-calorie malnutrition: Secondary | ICD-10-CM | POA: Diagnosis not present

## 2023-08-26 DIAGNOSIS — R4182 Altered mental status, unspecified: Secondary | ICD-10-CM | POA: Diagnosis not present

## 2023-08-26 LAB — GLUCOSE, CAPILLARY
Glucose-Capillary: 172 mg/dL — ABNORMAL HIGH (ref 70–99)
Glucose-Capillary: 218 mg/dL — ABNORMAL HIGH (ref 70–99)

## 2023-08-26 LAB — PATHOLOGIST SMEAR REVIEW

## 2023-08-26 MED ORDER — MEDIHONEY WOUND/BURN DRESSING EX PSTE: 1.0000 | PASTE | Freq: Every day | CUTANEOUS | Status: AC

## 2023-08-26 MED ORDER — IVABRADINE HCL 5 MG PO TABS: 5.0000 mg | ORAL_TABLET | Freq: Two times a day (BID) | ORAL | Status: AC

## 2023-08-26 MED ORDER — POLYETHYLENE GLYCOL 3350 17 G PO PACK
17.0000 g | PACK | Freq: Every day | ORAL | Status: DC
  Administered 2023-08-26: 17 g via ORAL
  Filled 2023-08-26: qty 1

## 2023-08-26 MED ORDER — CYANOCOBALAMIN 1000 MCG PO TABS: 1000.0000 ug | ORAL_TABLET | Freq: Every day | ORAL | Status: AC

## 2023-08-26 MED ORDER — INSULIN ASPART 100 UNIT/ML IJ SOLN: 0.0000 [IU] | Freq: Three times a day (TID) | INTRAMUSCULAR | Status: AC

## 2023-08-26 MED ORDER — SENNOSIDES-DOCUSATE SODIUM 8.6-50 MG PO TABS
1.0000 | ORAL_TABLET | Freq: Two times a day (BID) | ORAL | Status: DC
  Administered 2023-08-26: 1 via ORAL
  Filled 2023-08-26: qty 1

## 2023-08-26 MED ORDER — CARVEDILOL 3.125 MG PO TABS: 1.5600 mg | ORAL_TABLET | Freq: Two times a day (BID) | ORAL | Status: AC

## 2023-08-26 MED ORDER — POLYSACCHARIDE IRON COMPLEX 150 MG PO CAPS: 150.0000 mg | ORAL_CAPSULE | Freq: Every day | ORAL | Status: AC

## 2023-08-26 NOTE — Progress Notes (Addendum)
Speech Language Pathology Treatment: Dysphagia  Patient Details Name: [name] MRN: [id number] DOB: 1965/12/26 Today's Date: 08/26/2023 Time: 0950-1005 SLP Time Calculation (min) (ACUTE ONLY): 15 min  Assessment / Plan / Recommendation Clinical Impression  Pt wakes up easily, but does not sustain eyes open or remain as attentive and participatory last session when she was up in the chair. She does continue to accept pudding, thick liquids and ensure. No signs of aspiration. Swallowing ability appears somewhat the same as last week, but pt is not feeding herself or seeming as interested in PO. Will f/u for further needs while admitted.   HPI HPI: 57 year old woman who presented to [facility] 11/29 as a transfer from [facility] for AMS, suspected urosepsis. Patient is a resident of the [facility] [place]) and had decreased responsiveness throughout the course of the day. EMS noted GCS 6, fever and tachycardia. 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 Patient was intubated 11/29-12/1. PMHx significant for HTN, HLD, CVA (moderate-large R MCA infarct), T2DM, epilepsy, depression. 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 Cannot find any documentation of an MBS. Doesnt appear to have had one.      SLP Plan  Continue with current plan of care      Recommendations for follow up therapy are one component of a multi-disciplinary discharge planning process, led by the attending physician.  Recommendations may be updated based on patient status, additional functional criteria and insurance authorization.    Recommendations  Diet recommendations: Dysphagia 1 (puree);Nectar-thick liquid Liquids provided via: Straw Medication Administration: Crushed with puree Supervision: Staff to assist with self feeding;Full supervision/cueing for compensatory  strategies Compensations: Slow rate;Small sips/bites Postural Changes and/or Swallow Maneuvers: Seated upright 90 degrees;Upright 30-60 min after meal                              Continue with current plan of care     Jerusha Reising, Riley Nearing  08/26/2023, 11:52 AM

## 2023-08-26 NOTE — Discharge Summary (Addendum)
[facility] Phys[facility] Discharge Summary   Patient name: [name]  Admit date:     08/16/2023  Discharge date: 08/26/2023  Attending Physician: PCCM, MD [phone]  Discharge Physician: Carollee Herter   PCP: [facility]  Admitted From: SNF  Disposition:   [facility]  Recommendations for Outpatient Follow-up:  Follow up with PCP in 1-2 weeks  Home Health:No Equipment/Devices: Oxygen 2 L/min: chronically on 2 L/min  Discharge Condition:Stable CODE STATUS:DNR/DNI Diet recommendation: Dysphagia: Pureed Diet with Nectar Thick liquids Fluid Restriction: None  Hospital Summary: HPI: 57 year old woman who presented to [facility] 11/29 as a transfer from [facility] for AMS, suspected urosepsis. PMHx significant for HTN, HLD, CVA (moderate-large R MCA infarct), T2DM, epilepsy, depression.    Patient is a resident of the [facility]) and had decreased responsiveness throughout the course of the day. EMS noted GCS 6, fever and tachycardia. Labs were notable for WBC 13.4, Hgb 9.8, Plt 286. INR 1.1. Na 156, K 3.9, CO2 27, Cr 0.99 (baseline 0.8-1), mildly elevated transaminases 78/74. LA 1.5 > 4.0. ABG 7.42/46/100/29.2. COVID/Flu/RSV negative. UA +protein, nitrites, trace leuks. CXR without active disease. 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 Patient was intubated at [facility] for airway protection.   PCCM consulted for ICU admission and transfer.  Significant Events: Admitted 08/16/2023 for septic shock RLL PNA, UTI, acute respiratory failure with hypoxia Intubated in ER on 08-16-2023 08-21-2023 Care transferred to Hospitalist service   Significant Labs: Admission WBC 13.4, HgB 9.8, Na 156, BUN 45, Scr 0.99  Significant Imaging Studies: Admission CXR Support apparatus in appropriate position. 2. No active disease.3. Possible mild thoracic aortic  aneurysm. This could be confirmed with dedicated CT imaging. Admission CT chest/abd/pelvis consolidative opacity right lower lobe greater than middle and upper lobe. Pneumonias in the differential. Recommend follow-up. No bowel obstruction, free air. Minimal areas of fluid and stranding. ET tube and enteric tube. 08-19-2023 EEG suggestive of moderate diffuse encephalopathy. No seizures or epileptiform discharges were seen throughout the recording.   Antibiotic Therapy: Anti-infectives (From admission, onward)    Start     Dose/Rate Route Frequency Ordered Stop   08/19/23 1400  ceFAZolin (ANCEF) IVPB 2g/100 mL premix        2 g 200 mL/hr over 30 Minutes Intravenous Every 8 hours 08/19/23 0915 08/23/23 0559   08/17/23 0800  vancomycin (VANCOREADY) IVPB 1250 mg/250 mL  Status:  Discontinued        1,250 mg 166.7 mL/hr over 90 Minutes Intravenous Every 24 hours 08/16/23 1908 08/19/23 0915   08/16/23 2200  cefTRIAXone (ROCEPHIN) 2 g in sodium chloride 0.9 % 100 mL IVPB  Status:  Discontinued        2 g 200 mL/hr over 30 Minutes Intravenous Daily at bedtime 08/16/23 0632 08/19/23 0915   08/16/23 2200  vancomycin (VANCOREADY) IVPB 750 mg/150 mL  Status:  Discontinued        750 mg 150 mL/hr over 60 Minutes Intravenous Every 12 hours 08/16/23 0634 08/16/23 0928   08/16/23 0730  vancomycin (VANCOREADY) IVPB 1250 mg/250 mL        1,250 mg 166.7 mL/hr over 90 Minutes Intravenous  Once 08/16/23 0630 08/16/23 0939   08/16/23 0715  azithromycin (ZITHROMAX) 500 mg in sodium chloride 0.9 % 250 mL IVPB  Status:  Discontinued        500 mg 250 mL/hr over 60 Minutes Intravenous Every 24  hours 08/16/23 0628 08/16/23 0629   08/16/23 0645  azithromycin (ZITHROMAX) 500 mg in sodium chloride 0.9 % 250 mL IVPB        500 mg 250 mL/hr over 60 Minutes Intravenous Daily 08/16/23 0629 08/18/23 1148   08/16/23 0215  cefTRIAXone (ROCEPHIN) 2 g in sodium chloride 0.9 % 100 mL IVPB        2 g 200 mL/hr over 30 Minutes  Intravenous  Once 08/16/23 0206 08/16/23 0246       Procedures: 08-16-2023 endotracheal intubation 08-18-2023 extubation 08-19-2023 EEG suggestive of moderate diffuse encephalopathy. No seizures or epileptiform discharges were seen throughout the recording.  08-19-2023 Cortrak placement  Consultants: [facility] Course by Problem: * Septic shock (HCC) Since admission to 08-20-2023 improved, off pressors.  - CAP, MSSA on trach aspirate. Extubated 12/1 - MSSA on blood cx likely contaminate - Urine cx negative - continue Ancef 7 day course to tx MSSA PNA -cont scheduled duonebs  08-22-2023 resolved by the time she was transferred out of ICU on 08-20-2023  Dysphagia as late effect of cerebrovascular accident (CVA) 08-21-2023 Pt with a history of dyspahgia after CVA in 2021, last clinical evaluation recommended dys 2/nectar.  Seen by ST.  Currently has cortrak.  08-22-2023 Discussed with ST and RD. Discussed with husband. Pt is safe to eat with dyphagia diet and nectar thick liquids. Will pull cortrak and start calorie counts.   Will see this weekend if pt can maintain hydration and enough po intake.   Discussed with husband that every time pt gets admitted to hospital, she gets weaker and weaker. She nearly died this admission due to septic shock. Discussed with husband that keeping pt at same level of function should be expectation rather than pt will improve.   Discussed PEG tube and whether this would be consider by some people as artificial nutrition/life support.   Husband states they have been married for 37 years.   Discussed with him that if pt unable to take enough liquids and solid to stay hydrate and nourished, he will need to consider PEG tube.  This would not prevent aspiration pneumonia and may prolong her suffering. I asked pt to think about pt's life and their wishes/dreams as they got older.  Will enlist palliative care to help husband with goals of care. 08-23-2023  cortrak removed. Calorie counts started yesterday. Will see how much she can eat and drink this weekend. Awaiting palliative care consult. 08-24-2023 calorie counts to finish today. Pt eating enough in my estimation but will see what the expert dietician says.  08-25-2023 awaiting calorie counts by RD. Restart dysphagia-1 diet with nectar thick liquids.  08-26-2023 ST working with pt again today. Remains on dysphagia-1 diet(pureed) with nectar thick liquids. Family has decided AGAINST PEG tube. I agree with this decision.  Weakness of left side of body 08-21-2023 chronic.  Pneumonia of both lower lobes due to methicillin susceptible Staphylococcus aureus (MSSA) (HCC) Since admission to 08-20-2023 improved, off pressors.  - CAP, MSSA on trach aspirate. Extubated 12/1 - MSSA on blood cx likely contaminate - Urine cx negative - continue Ancef 7 day course to tx MSSA PNA -cont scheduled duonebs 08-21-2023 continue with IV Ancef. Today is Day #3 of 7 08-22-2023 discussed with pharmacy. Pt had been on IV rocephin from 11-29 through 12-2.  Will count those days of IV rocephin as therapy towards her treatment of MSSA pneumonia. Will continue IV Ancef for a total of 5 days. Today is  Day #4 of 5. 08-23-2023 will complete IV ancef today Today Day #5 of 5. Resolved.  History of cardioembolic cerebrovascular accident (CVA) 08-21-2023 chronic.  DNR (do not resuscitate)/DNI(Do Not Intubate) The family outlined their wishes for the following treatment decisions:   Cardiopulmonary Resuscitation: Do Not Attempt Resuscitation (DNR/No CPR)  Medical Interventions: Limited Additional Interventions: Use medical treatment, IV fluids and cardiac monitoring as indicated, DO NOT USE intubation or mechanical ventilation. May consider use of less invasive airway support such as BiPAP or CPAP. Also provide comfort measures. Transfer to the hospital if indicated. Avoid intensive care.   Antibiotics: Determine use of  limitation of antibiotics when infection occurs  IV Fluids: IV fluids for a defined trial period  Feeding Tube: No feeding tube          Overweight (BMI 25.0-29.9) 08-22-2023 BMI 29.81  Pressure injury of skin  - bilateral heels, bilateral buttocks 08-21-2023 present on admission.  Altered mental status 08-21-2023 unclear what pt's baseline is. Pt lives in SNF prior to admission. 08-23-2023 pt is more awake today. Placed into recliner by OT 08-24-2023 pt is stable. This appear at her baseline. 08-25-2023 appeared to have a "spell" last night. No seizures noted. CT head negative for new CVA. EEG shows moderate diffuse encephalopathy.  Pt has been made DNR by pt's husband per palliative care service.  Malnutrition of moderate degree 08-21-2023 see RD note 08-21-2023. Moderate Malnutrition related to chronic illness as evidenced by mild fat depletion, severe muscle depletion, moderate muscle depletion.   08-22-2023 will stop cortrak. Start calorie counts. Will monitor over weekend to see how much liquids and solids she takes in.  08-23-2023 continue with calorie counts this weekend. Awaiting palliative care consult for GOC discussion in case pt does not take enough liquids/food to sustain herself. 08-24-2023 awaiting calorie counts by RD.  GOC discussion with palliative care today.  08-25-2023 discussed with palliative care. Per my discussion with palliative care, pt's husband would never want pt to have PEG.  Iron deficiency anemia 08-21-2023 Hg 8.1 g/dl today.   Iron studies show iron deficiency. On tube feeds that have iron. Iron/TIBC/Ferritin/ %Sat    Component Value Date/Time   IRON 20 (L) 08/16/2023 0656   TIBC 204 (L) 08/16/2023 0656   FERRITIN 388 (H) 08/16/2023 0656   IRONPCTSAT 10 (L) 08/16/2023 0656   08-23-2023 will start po nu-iron 150 mg daily  Epilepsy (HCC) 08-21-2023 continue with depakene 08-24-2023 continue with depakene. No breakthrough seizures    Discharge  Diagnoses:  Principal Problem:   Septic shock (HCC) Active Problems:   Weakness of left side of body   Dysphagia as late effect of cerebrovascular accident (CVA)   History of cardioembolic cerebrovascular accident (CVA)   Pneumonia of both lower lobes due to methicillin susceptible Staphylococcus aureus (MSSA) (HCC)   Epilepsy (HCC)   Iron deficiency anemia   Malnutrition of moderate degree   Altered mental status   Pressure injury of skin  - bilateral heels, bilateral buttocks   Overweight (BMI 25.0-29.9)   DNR (do not resuscitate)/DNI(Do Not Intubate)   Discharge Instructions  Discharge Instructions     Call MD for:  difficulty breathing, headache or visual disturbances   Complete by: As directed    Call MD for:  persistant dizziness or light-headedness   Complete by: As directed    Call MD for:  persistant nausea and vomiting   Complete by: As directed    Call MD for:  temperature >100.4   Complete  by: As directed    DIET - DYS 1   Complete by: As directed    Pureed Diabetic diet with nectar thick liquids   Fluid consistency: Nectar Thick   Discharge wound care:   Complete by: As directed    1. Clean heels with soap and water daily, place a single layer Xeroform gauze [place] [phone]) to purple maroon discoloration daily.  Cover with dry gauze and Kerlix roll gauze. Place bilateral feet in Prevalon boots to offload pressure.   2. Clean R medial lower leg wound with NS daily, apply a small piece of Xeroform gauze [place] [phone] to wound bed daily and secure with silicone foam.  May lift foam daily to replace Xeroform.  Change foam q3 days and prn soiling.  3. Clean buttocks, perineal area with NS, apply Medihoney to wound beds daily, cover with dry gauze. Coat surrounding skin with Gerhardt's Butt Cream 2 times daily and prn soiling. Cover with silicone foam or ABD pad whichever is preferred.   Increase activity slowly   Complete by: As directed       Allergies as of  08/26/2023   No Known Allergies      Medication List     STOP taking these medications    amLODipine 10 MG tablet Commonly known as: NORVASC   ferrous sulfate 325 (65 FE) MG tablet   insulin detemir 100 UNIT/ML FlexPen Commonly known as: Levemir FlexPen   lisinopril 40 MG tablet Commonly known as: ZESTRIL   metFORMIN 750 MG 24 hr tablet Commonly known as: GLUCOPHAGE-XR   metoprolol tartrate 25 MG tablet Commonly known as: LOPRESSOR   saccharomyces boulardii 250 MG capsule Commonly known as: FLORASTOR   Semglee (yfgn) 100 UNIT/ML Pen Generic drug: insulin glargine-yfgn   senna-docusate 8.6-50 MG tablet Commonly known as: Senokot-S   VESIcare 10 MG tablet Generic drug: solifenacin       TAKE these medications    acetaminophen 325 MG tablet Commonly known as: TYLENOL Take 2 tablets (650 mg total) by mouth every 6 (six) hours as needed for mild pain (or Fever >/= 101).   aspirin EC 81 MG tablet Take 1 tablet (81 mg total) by mouth daily. Swallow whole.   atorvastatin 40 MG tablet Commonly known as: LIPITOR Take 1 tablet (40 mg total) by mouth daily at 6 PM.   carvedilol 3.125 MG tablet Commonly known as: COREG Take 0.5 tablets (1.56 mg total) by mouth 2 (two) times daily.   clopidogrel 75 MG tablet Commonly known as: PLAVIX Take 75 mg by mouth daily.   cyanocobalamin 1000 MCG tablet Take 1 tablet (1,000 mcg total) by mouth daily. Start taking on: August 27, 2023   divalproex 500 MG DR tablet Commonly known as: DEPAKOTE Take 1 tablet (500 mg total) by mouth at bedtime.   divalproex 250 MG 24 hr tablet Commonly known as: DEPAKOTE ER Take 250 mg by mouth daily.   insulin aspart 100 UNIT/ML injection Commonly known as: novoLOG Inject 0-20 Units into the skin 4 (four) times daily -  before meals and at bedtime.   iron polysaccharides 150 MG capsule Commonly known as: NIFEREX Take 1 capsule (150 mg total) by mouth daily. Start taking on:  August 27, 2023   ivabradine 5 MG Tabs tablet Commonly known as: CORLANOR Take 1 tablet (5 mg total) by mouth 2 (two) times daily with a meal.   latanoprost 0.005 % ophthalmic solution Commonly known as: XALATAN Place 1 drop into both eyes at bedtime.  leptospermum manuka honey Pste paste Apply 1 Application topically daily. Clean buttocks PI with NS, apply Medihoney to wound beds daily, cover with dry gauze. Coat surrounding skin with Gerhardt's Butt Cream 2 times daily and prn soiling. Cover with silicone foam or ABD pad whichever is preferred.  Interchangeable with TheraHoney Apply thin layer (3 mm) to wound. Start taking on: August 27, 2023   polyethylene glycol 17 g packet Commonly known as: MIRALAX / GLYCOLAX Take 17 g by mouth daily.   sertraline 50 MG tablet Commonly known as: ZOLOFT Take 50 mg by mouth daily.   Vitamin D (Ergocalciferol) 1.25 MG (50000 UNIT) Caps capsule Commonly known as: DRISDOL Take 50,000 Units by mouth once a week.               Discharge Care Instructions  (From admission, onward)           Start     Ordered   08/26/23 0000  Discharge wound care:       Comments: 1. Clean heels with soap and water daily, place a single layer Xeroform gauze [place] [phone]) to purple maroon discoloration daily.  Cover with dry gauze and Kerlix roll gauze. Place bilateral feet in Prevalon boots to offload pressure.   2. Clean R medial lower leg wound with NS daily, apply a small piece of Xeroform gauze [place] [phone]) to wound bed daily and secure with silicone foam.  May lift foam daily to replace Xeroform.  Change foam q3 days and prn soiling.  3. Clean buttocks, perineal area with NS, apply Medihoney to wound beds daily, cover with dry gauze. Coat surrounding skin with Gerhardt's Butt Cream 2 times daily and prn soiling. Cover with silicone foam or ABD pad whichever is preferred.   08/26/23 1346            Contact information for after-discharge  care     Destination     [facility] .   Service: Skilled Nursing Contact information: [address] [place] [address] [phone]                    No Known Allergies  Discharge Exam: Vitals:   08/26/23 0740 08/26/23 1205  BP: (!) 163/76 139/84  Pulse: 98 95  Resp: 16 17  Temp: 98.5 F (36.9 C)   SpO2: 100% 100%    Physical Exam Vitals and nursing note reviewed.  Constitutional:      Comments: Awake but non-verbal  HENT:     Head: Normocephalic.  Cardiovascular:     Rate and Rhythm: Normal rate and regular rhythm.  Pulmonary:     Effort: Pulmonary effort is normal.  Abdominal:     General: Bowel sounds are normal.     Palpations: Abdomen is soft.  Skin:    General: Skin is warm and dry.  Neurological:     Comments: Pt is non verbal Pt is awake. Does not follow commands Pt is sucking on her fingers of right hand     The results of significant diagnostics from this hospitalization (including imaging, microbiology, ancillary and laboratory) are listed below for reference.     Labs: Basic Metabolic Panel: Recent Labs  Lab 08/20/23 0554 08/21/23 0729 08/24/23 2106 08/25/23 0713  NA 139 138 142 145  K 3.4* 3.8 3.8 3.8  CL 105 106 107 109  CO2 25 27 27 25   GLUCOSE 160* 215* 199* 153*  BUN 8 9 21* 17  CREATININE 0.49  0.60 0.69 0.66  CALCIUM 8.3* 7.9* 8.7* 8.8*  MG 1.6*  --   --  1.6*  PHOS 2.1*  --   --   --    Liver Function Tests: Recent Labs  Lab 08/21/23 0729 08/24/23 2106 08/25/23 0713  AST 42* 82* 51*  ALT 47* 65* 52*  ALKPHOS 47 55 44  BILITOT 0.2 0.4 0.5  PROT 4.8* 5.1* 5.3*  ALBUMIN 2.0* 2.1* 2.2*    Recent Labs  Lab 08/24/23 2106  AMMONIA 26   CBC: Recent Labs  Lab 08/20/23 0821 08/21/23 0729 08/24/23 2106 08/25/23 0713  WBC 6.8 7.0 7.6 7.3  NEUTROABS  --   --   --  3.1  HGB 9.8* 8.1* 8.0* 8.5*  HCT 30.2* 24.9* 24.9* 26.5*  MCV 72.1* 71.6* 73.0* 75.3*  PLT  212 214 302 265   CBG: Recent Labs  Lab 08/25/23 1302 08/25/23 1641 08/25/23 2118 08/26/23 0739 08/26/23 1149  GLUCAP 133* 125* 164* 172* 218*   Urinalysis    Component Value Date/Time   COLORURINE YELLOW 08/16/2023 0205   APPEARANCEUR CLOUDY (A) 08/16/2023 0205   LABSPEC 1.020 08/16/2023 0205   PHURINE 6.0 08/16/2023 0205   GLUCOSEU NEGATIVE 08/16/2023 0205   HGBUR NEGATIVE 08/16/2023 0205   BILIRUBINUR NEGATIVE 08/16/2023 0205   KETONESUR NEGATIVE 08/16/2023 0205   PROTEINUR 30 (A) 08/16/2023 0205   NITRITE POSITIVE (A) 08/16/2023 0205   LEUKOCYTESUR TRACE (A) 08/16/2023 0205   Sepsis Labs Recent Labs  Lab 08/20/23 0821 08/21/23 0729 08/24/23 2106 08/25/23 0713  WBC 6.8 7.0 7.6 7.3    Procedures/Studies: CT HEAD WO CONTRAST ( )  Result Date: 08/25/2023 CLINICAL DATA:  Altered mental status EXAM: CT HEAD WITHOUT CONTRAST TECHNIQUE: Contiguous axial images were obtained from the base of the skull through the vertex without intravenous contrast. RADIATION DOSE REDUCTION: This exam was performed according to the departmental dose-optimization program which includes automated exposure control, adjustment of the mA and/or kV according to patient size and/or use of iterative reconstruction technique. COMPARISON:  08/16/2023 FINDINGS: Brain: No evidence of acute infarction, hemorrhage, hydrocephalus, extra-axial collection or mass lesion/mass effect. Encephalomalacic changes related to prior right MCA distribution infarct. Subcortical white matter and periventricular small vessel ischemic changes. Global cortical and central atrophy. Secondary ventricular prominence. Vascular: Intracranial atherosclerosis. Skull: Normal. Negative for fracture or focal lesion. Sinuses/Orbits: The visualized paranasal sinuses are essentially clear. The mastoid air cells are unopacified. Other: None. IMPRESSION: No acute intracranial abnormality. Prior right MCA distribution infarct. Atrophy with  small vessel ischemic changes. Electronically Signed   By: Charline Bills M.D.   On: 08/25/2023 00:37   EEG adult  Result Date: 08/24/2023 Charlsie Quest, MD     08/24/2023 11:15 PM Patient Name: Hadlyn Mumper MRN: [id number] Epilepsy Attending: Charlsie Quest Referring Physician/Provider: [name], MD Date: 08/24/2023 Duration: 43.18 mins Patient history: 57yo F with h/o epilepsy now with ams getting eeg to evaluate for seizure Level of alertness: Awake, asleep AEDs during EEG study: VPA Technical aspects: This EEG study was done with scalp electrodes positioned according to the 10-20 International system of electrode placement. Electrical activity was reviewed with band pass filter of 1-70Hz , sensitivity of 7 uV/mm, display speed of 40mm/sec with a 60Hz  notched filter applied as appropriate. EEG data were recorded continuously and digitally stored.  Video monitoring was available and reviewed as appropriate. Description: The posterior dominant rhythm consists of 7 Hz activity of moderate voltage (25-35 uV) seen predominantly in posterior head regions, symmetric  and reactive to eye opening and eye closing. Sleep was characterized by vertex waves, sleep spindles (12 to 14 Hz), maximal frontocentral region. EEG showed continuous generalized predominantly 5 to 6 Hz theta slowing admixed with intermittent 2-3Hz  delta slowing. Hyperventilation and photic stimulation were not performed.   ABNORMALITY - Continuous slow, generalized IMPRESSION: This study is suggestive of moderate diffuse encephalopathy. No seizures or epileptiform discharges were seen throughout the recording. Priyanka [name]   DG CHEST PORT 1 VIEW  Result Date: 08/24/2023 CLINICAL DATA:  Short of breath EXAM: PORTABLE CHEST 1 VIEW COMPARISON:  08/17/2023 FINDINGS: Single frontal view of the chest demonstrates stable enlargement of the cardiac silhouette. No airspace disease, effusion, or pneumothorax. No acute bony abnormalities.  IMPRESSION: 1. No acute intrathoracic process. Electronically Signed   By: Sharlet Salina M.D.   On: 08/24/2023 21:11   DG Abd Portable 1V  Result Date: 08/19/2023 CLINICAL DATA:  [id number] Encounter for feeding tube placement [id number] EXAM: PORTABLE ABDOMEN - 1 VIEW COMPARISON:  None Available. FINDINGS: The bowel gas pattern is non-obstructive. No evidence of pneumoperitoneum, within the limitations of a supine film. No acute osseous abnormalities. The soft tissues are within normal limits. Surgical changes, devices, tubes and lines: Interval placement of feeding tube with its tip overlying the right midabdomen, likely within the pylorus. IMPRESSION: *Nonobstructive bowel gas pattern. Interval placement of feeding tube with its tip overlying the right midabdomen, likely within the pylorus. Electronically Signed   By: Jules Schick M.D.   On: 08/19/2023 14:26   EEG adult  Result Date: 08/19/2023 Charlsie Quest, MD     08/19/2023 12:45 PM Patient Name: Wava Cuadrado MRN: [id number] Epilepsy Attending: Charlsie Quest Referring Physician/Provider: Erick Alley, DO Date: 08/19/2023 Duration: 22.05 mins Patient history: 57 yo F with R MCA infarct and epilepsy. EEG to evaluate for seizure Level of alertness: Awake AEDs during EEG study: VPA Technical aspects: This EEG study was done with scalp electrodes positioned according to the 10-20 International system of electrode placement. Electrical activity was reviewed with band pass filter of 1-70Hz , sensitivity of 7 uV/mm, display speed of 7mm/sec with a 60Hz  notched filter applied as appropriate. EEG data were recorded continuously and digitally stored.  Video monitoring was available and reviewed as appropriate. Description: The posterior dominant rhythm consists of 7 Hz activity of moderate voltage (25-35 uV) seen predominantly in posterior head regions, symmetric and reactive to eye opening and eye closing. EEG showed continuous generalized 3 to 6 Hz theta-delta  slowing. Hyperventilation and photic stimulation were not performed.   ABNORMALITY - Continuous slow, generalized IMPRESSION: This study is suggestive of moderate diffuse encephalopathy. No seizures or epileptiform discharges were seen throughout the recording. Charlsie Quest   DG Chest Port 1 View  Result Date: 08/17/2023 CLINICAL DATA:  History of endotracheal tube EXAM: PORTABLE CHEST 1 VIEW COMPARISON:  08/16/2023 FINDINGS: Endotracheal tube with tip just below the clavicular heads. An enteric tube at least reaches the stomach. Improved aeration at the right base. Infiltrate in the right upper and lower lobes by CT. No edema, effusion, or pneumothorax. Normal heart size. Artifact from EKG leads. IMPRESSION: 1. Stable hardware positioning. 2. Right pulmonary infiltrate with improved aeration. Electronically Signed   By: Tiburcio Pea M.D.   On: 08/17/2023 05:39   CT CHEST ABDOMEN PELVIS W CONTRAST  Result Date: 08/16/2023 CLINICAL DATA:  Sepsis. EXAM: CT CHEST, ABDOMEN, AND PELVIS WITH CONTRAST TECHNIQUE: Multidetector CT imaging of the chest, abdomen and pelvis was  performed following the standard protocol during bolus administration of intravenous contrast. RADIATION DOSE REDUCTION: This exam was performed according to the departmental dose-optimization program which includes automated exposure control, adjustment of the mA and/or kV according to patient size and/or use of iterative reconstruction technique. CONTRAST:  75mL OMNIPAQUE IOHEXOL 350 MG/ML SOLN COMPARISON:  Chest x-ray 08/16/2023. Older x-rays. CT angiogram chest 02/27/2020 FINDINGS: CT CHEST FINDINGS Cardiovascular: Heart is nonenlarged. Coronary artery calcifications are seen trace pericardial fluid. Thoracic aorta has a normal course and caliber with scattered atherosclerotic calcified plaque. Mediastinum/Nodes: Enteric tube in place with the tip extending into the body of the stomach. ET tube in place. Slightly heterogeneous  thyroid gland. No discrete abnormal lymph node enlargement identified in the axillary regions, hilum or mediastinum. Lungs/Pleura: Breathing motion seen. Left lung is grossly clear. No left-sided pleural effusion. Right lower lobe dependent consolidative opacity with air bronchograms identified. There is also some patchy opacities in the right upper lobe which are somewhat nodular but could represent additional areas of infiltrate. Minimal areas as well in the middle lobe. No pneumothorax. Trace pleural fluid. Musculoskeletal: Slight curvature of the thoracic spine with scattered degenerative changes. There is streak artifact as the arms were scanned at the patient's side. CT ABDOMEN PELVIS FINDINGS Hepatobiliary: Focal fat deposition seen in the liver adjacent to the falciform ligament. No other space-occupying liver lesion. Patent portal vein. Gallbladder is nondilated. Pancreas: Mild global atrophy of the pancreas. Spleen: Normal in size without focal abnormality. Adrenals/Urinary Tract: Adrenal glands are unremarkable. Kidneys are normal, without renal calculi, focal lesion, or hydronephrosis. Bladder is unremarkable. Foley catheter in the urinary bladder. Stomach/Bowel: On this non oral contrast exam, the bowel is nondilated. Scattered colonic stool. Small normal appendix in the right lower quadrant. Enteric tube in the stomach. Vascular/Lymphatic: Aortic atherosclerosis. No enlarged abdominal or pelvic lymph nodes. Reproductive: Uterus and bilateral adnexa are unremarkable. Other: Anasarca. Slight mesenteric haziness with trace areas of fluid. No free air Musculoskeletal: Scattered degenerative changes of the spine and pelvis. Slight curvature of the spine. IMPRESSION: Consolidative opacity right lower lobe greater than middle and upper lobe. Pneumonias in the differential. Recommend follow-up. No bowel obstruction, free air. Minimal areas of fluid and stranding. ET tube and enteric tube Electronically Signed    By: Karen Kays M.D.   On: 08/16/2023 13:10   ECHOCARDIOGRAM COMPLETE  Result Date: 08/16/2023    ECHOCARDIOGRAM REPORT   Patient Name:   Gurneet Prak Date of Exam: 08/16/2023 Medical Rec #:  [id number]  Height:       62.0 in Accession #:    [id number] Weight:       150.0 lb Date of Birth:  05/18/1966  BSA:          1.692 m Patient Age:    56 years   BP:           88/75 mmHg Patient Gender: F          HR:           104 bpm. Exam Location:  Inpatient Procedure: 2D Echo, Cardiac Doppler and Color Doppler Indications:    Murmur  History:        Patient has prior history of Echocardiogram examinations, most                 recent 02/25/2020. Risk Factors:Hypertension, Diabetes and                 Dyslipidemia.  Sonographer:    Karma Ganja  Referring Phys: Patrici Ranks  Sonographer Comments: Echo performed with patient supine and on artificial respirator. IMPRESSIONS  1. Left ventricular ejection fraction, by estimation, is 65 to 70%. The left ventricle has normal function. The left ventricle has no regional wall motion abnormalities. There is moderate left ventricular hypertrophy. Left ventricular diastolic parameters are consistent with Grade I diastolic dysfunction (impaired relaxation).  2. Right ventricular systolic function is normal. The right ventricular size is normal. There is mildly elevated pulmonary artery systolic pressure.  3. The mitral valve is normal in structure. No evidence of mitral valve regurgitation.  4. The aortic valve was not well visualized. Aortic valve regurgitation is not visualized.  5. The inferior vena cava is normal in size with greater than 50% respiratory variability, suggesting right atrial pressure of 3 mmHg. Comparison(s): No significant change from prior study. FINDINGS  Left Ventricle: Left ventricular ejection fraction, by estimation, is 65 to 70%. The left ventricle has normal function. The left ventricle has no regional wall motion abnormalities. The left ventricular  internal cavity size was normal in size. There is  moderate left ventricular hypertrophy. Left ventricular diastolic parameters are consistent with Grade I diastolic dysfunction (impaired relaxation). Right Ventricle: The right ventricular size is normal. Right ventricular systolic function is normal. There is mildly elevated pulmonary artery systolic pressure. The tricuspid regurgitant velocity is 3.18 m/s, and with an assumed right atrial pressure of 3 mmHg, the estimated right ventricular systolic pressure is 43.4 mmHg. Left Atrium: Left atrial size was normal in size. Right Atrium: Right atrial size was normal in size. Pericardium: There is no evidence of pericardial effusion. Mitral Valve: The mitral valve is normal in structure. No evidence of mitral valve regurgitation. Tricuspid Valve: Tricuspid valve regurgitation is not demonstrated. Aortic Valve: The aortic valve was not well visualized. Aortic valve regurgitation is not visualized. Aortic valve mean gradient measures 4.0 mmHg. Aortic valve peak gradient measures 7.3 mmHg. Aortic valve area, by VTI measures 2.57 cm. Pulmonic Valve: Pulmonic valve regurgitation is not visualized. Aorta: The aortic root and ascending aorta are structurally normal, with no evidence of dilitation. Venous: The inferior vena cava is normal in size with greater than 50% respiratory variability, suggesting right atrial pressure of 3 mmHg. IAS/Shunts: No atrial level shunt detected by color flow Doppler.  LEFT VENTRICLE PLAX 2D LVIDd:         3.30 cm   Diastology LVIDs:         2.00 cm   LV e' medial:    5.87 cm/s LV PW:         1.30 cm   LV E/e' medial:  11.2 LV IVS:        1.20 cm   LV e' lateral:   13.20 cm/s LVOT diam:     2.00 cm   LV E/e' lateral: 5.0 LV SV:         51 LV SV Index:   30 LVOT Area:     3.14 cm  RIGHT VENTRICLE             IVC RV Basal diam:  3.00 cm     IVC diam: 1.10 cm RV S prime:     13.60 cm/s TAPSE (M-mode): 1.9 cm LEFT ATRIUM             Index        RIGHT ATRIUM          Index LA diam:        2.20  cm 1.30 cm/m  RA Area:     9.20 cm LA Vol (A2C):   13.2 ml 7.80 ml/m  RA Volume:   18.00 ml 10.64 ml/m LA Vol (A4C):   5.6 ml  3.29 ml/m LA Biplane Vol: 9.2 ml  5.42 ml/m  AORTIC VALVE AV Area (Vmax):    2.47 cm AV Area (Vmean):   2.59 cm AV Area (VTI):     2.57 cm AV Vmax:           135.00 cm/s AV Vmean:          89.800 cm/s AV VTI:            0.199 m AV Peak Grad:      7.3 mmHg AV Mean Grad:      4.0 mmHg LVOT Vmax:         106.00 cm/s LVOT Vmean:        73.900 cm/s LVOT VTI:          0.163 m LVOT/AV VTI ratio: 0.82  AORTA Ao Root diam: 2.70 cm MITRAL VALVE               TRICUSPID VALVE MV Area (PHT): 3.39 cm    TR Peak grad:   40.4 mmHg MV Decel Time: 224 msec    TR Vmax:        318.00 cm/s MV E velocity: 66.00 cm/s MV A velocity: 99.00 cm/s  SHUNTS MV E/A ratio:  0.67        Systemic VTI:  0.16 m                            Systemic Diam: 2.00 cm Carolan Clines Electronically signed by Carolan Clines Signature Date/Time: 08/16/2023/10:40:41 AM    Final    DG CHEST PORT 1 VIEW  Result Date: 08/16/2023 CLINICAL DATA:  [id number].  Endotracheally intubated. EXAM: PORTABLE CHEST 1 VIEW COMPARISON:  Portable chest earlier today at 2:30 a.m. FINDINGS: 6:18 a.m. ETT tip is 3.2 cm from the carina NGT has been pulled back, with the side-hole at the GE junction and should be advanced further in. Chronically elevated right hemidiaphragm. There is overlying increased opacity which could be atelectasis or a small infiltrate. The lungs are otherwise clear. Heart size and vascular pattern are normal with stable mediastinum with aortic tortuosity, ectasia and atherosclerosis. No new osseous findings.  Thoracic spondylosis. IMPRESSION: 1. ETT tip 3.2 cm from the carina. 2. NGT side-hole at the GE junction and should be advanced further in. 3. Chronically elevated right hemidiaphragm with overlying atelectasis or small infiltrate. 4. Aortic atherosclerosis. Electronically  Signed   By: Almira Bar M.D.   On: 08/16/2023 06:33   CT HEAD WO CONTRAST ( )  Result Date: 08/16/2023 CLINICAL DATA:  Delirium, fever EXAM: CT HEAD WITHOUT CONTRAST TECHNIQUE: Contiguous axial images were obtained from the base of the skull through the vertex without intravenous contrast. RADIATION DOSE REDUCTION: This exam was performed according to the departmental dose-optimization program which includes automated exposure control, adjustment of the mA and/or kV according to patient size and/or use of iterative reconstruction technique. COMPARISON:  03/19/2020 FINDINGS: Brain: Moderate parenchymal volume loss is relatively advanced given the patient's age, but appears stable since prior examination. Mildly asymmetrically more severe cerebellar atrophy noted globally. Mild periventricular white matter changes are present in keeping with small vessel ischemia. There is extensive cortical encephalomalacia involving the right temporoparietal cortex, stable since prior examination in  keeping with large right MCA territory infarct. There is moderate ventriculomegaly which appears mildly progressive since prior examination and appears slightly disproportionally more severe than the degree of global atrophy. This may represent asymmetric central atrophy though changes of communicating hydrocephalus could appear similarly. No acute infarct. No abnormal mass effect or midline shift. No acute intracranial hemorrhage. No abnormal intra or extra-axial mass lesion. Vascular: No hyperdense vessel or unexpected calcification. Skull: Normal. Negative for fracture or focal lesion. Sinuses/Orbits: No acute finding. Other: Mastoid air cells and middle ear cavities are clear. Layering fluid noted within the posterior nasopharynx, nonspecific. IMPRESSION: 1. No acute intracranial abnormality. No definite radiographic explanation for the patient's reported symptoms. 2. Moderate ventriculomegaly, mildly progressive since  prior examination and appears slightly disproportionally more severe than the degree of global atrophy. This may reflect changes of communicating (normal-pressure) hydrocephalus and correlation with clinical examination is recommended. 3. 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 4. Moderate parenchymal volume loss, relatively advanced given the patient's age, but appears stable since prior examination. Electronically Signed   By: Helyn Numbers M.D.   On: 08/16/2023 02:56   DG Chest Port 1 View  Result Date: 08/16/2023 CLINICAL DATA:  Sepsis, respiratory failure EXAM: PORTABLE CHEST 1 VIEW COMPARISON:  None Available. FINDINGS: Endotracheal tube seen 2.6 cm above the carina. Nasogastric tube tip overlies the expected gastric fundus. Mild elevation of the right hemidiaphragm. Lungs are clear. Lung apices are excluded from view. No definite pneumothorax. No pleural effusion. Cardiac size within normal limits. Thoracic aorta appears mildly aneurysmal, not well assessed on this examination. IMPRESSION: 1. Support apparatus in appropriate position. 2. No active disease. 3. Possible mild thoracic aortic aneurysm. This could be confirmed with dedicated CT imaging. Electronically Signed   By: Helyn Numbers M.D.   On: 08/16/2023 02:45    Time coordinating discharge: 45 mins  SIGNED:  Carollee Herter, DO [facility] 08/26/23, 2:09 PM

## 2023-08-26 NOTE — Progress Notes (Addendum)
PROGRESS NOTE    [name]  XBM:[id number] DOB: 07-25-1966 DOA: 08/16/2023 PCP: [facility]  Subjective: Pt seen and examined. Stable. Palliative care met with pt's family. Pt has been made DNR/DNI.   Hospital Course: HPI: 57 year old woman who presented to [facility] 11/29 as a transfer from [facility] for AMS, suspected urosepsis. PMHx significant for HTN, HLD, CVA (moderate-large R MCA infarct), T2DM, epilepsy, depression.    Patient is a resident of the [facility]) and had decreased responsiveness throughout the course of the day. EMS noted GCS 6, fever and tachycardia. Labs were notable for WBC 13.4, Hgb 9.8, Plt 286. INR 1.1. Na 156, K 3.9, CO2 27, Cr 0.99 (baseline 0.8-1), mildly elevated transaminases 78/74. LA 1.5 > 4.0. ABG 7.42/46/100/29.2. COVID/Flu/RSV negative. UA +protein, nitrites, trace leuks. CXR without active disease. 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 Patient was intubated at [facility] for airway protection.   PCCM consulted for ICU admission and transfer.  Significant Events: Admitted 08/16/2023 for septic shock RLL PNA, UTI, acute respiratory failure with hypoxia Intubated in ER on 08-16-2023 08-21-2023 Care transferred to Hospitalist service   Significant Labs: Admission WBC 13.4, HgB 9.8, Na 156, BUN 45, Scr 0.99  Significant Imaging Studies: Admission CXR Support apparatus in appropriate position. 2. No active disease.3. Possible mild thoracic aortic aneurysm. This could be confirmed with dedicated CT imaging. Admission CT chest/abd/pelvis consolidative opacity right lower lobe greater than middle and upper lobe. Pneumonias in the differential. Recommend follow-up. No bowel obstruction, free air. Minimal areas of fluid and stranding. ET tube and enteric tube. 08-19-2023 EEG suggestive of moderate diffuse encephalopathy. No  seizures or epileptiform discharges were seen throughout the recording.   Antibiotic Therapy: Anti-infectives (From admission, onward)    Start     Dose/Rate Route Frequency Ordered Stop   08/19/23 1400  ceFAZolin (ANCEF) IVPB 2g/100 mL premix        2 g 200 mL/hr over 30 Minutes Intravenous Every 8 hours 08/19/23 0915 08/23/23 0559   08/17/23 0800  vancomycin (VANCOREADY) IVPB 1250 mg/250 mL  Status:  Discontinued        1,250 mg 166.7 mL/hr over 90 Minutes Intravenous Every 24 hours 08/16/23 1908 08/19/23 0915   08/16/23 2200  cefTRIAXone (ROCEPHIN) 2 g in sodium chloride 0.9 % 100 mL IVPB  Status:  Discontinued        2 g 200 mL/hr over 30 Minutes Intravenous Daily at bedtime 08/16/23 0632 08/19/23 0915   08/16/23 2200  vancomycin (VANCOREADY) IVPB 750 mg/150 mL  Status:  Discontinued        750 mg 150 mL/hr over 60 Minutes Intravenous Every 12 hours 08/16/23 0634 08/16/23 0928   08/16/23 0730  vancomycin (VANCOREADY) IVPB 1250 mg/250 mL        1,250 mg 166.7 mL/hr over 90 Minutes Intravenous  Once 08/16/23 0630 08/16/23 0939   08/16/23 0715  azithromycin (ZITHROMAX) 500 mg in sodium chloride 0.9 % 250 mL IVPB  Status:  Discontinued        500 mg 250 mL/hr over 60 Minutes Intravenous Every 24 hours 08/16/23 0628 08/16/23 0629   08/16/23 0645  azithromycin (ZITHROMAX) 500 mg in sodium chloride 0.9 % 250 mL IVPB        500 mg 250 mL/hr over 60 Minutes Intravenous Daily 08/16/23 0629 08/18/23 1148   08/16/23 0215  cefTRIAXone (ROCEPHIN) 2 g in sodium chloride 0.9 %  100 mL IVPB        2 g 200 mL/hr over 30 Minutes Intravenous  Once 08/16/23 0206 08/16/23 0246       Procedures: 08-16-2023 endotracheal intubation 08-18-2023 extubation 08-19-2023 EEG suggestive of moderate diffuse encephalopathy. No seizures or epileptiform discharges were seen throughout the recording.  08-19-2023 Cortrak placement  Consultants: PCCM    Assessment and Plan: * Septic shock (HCC) Since  admission to 08-20-2023 improved, off pressors.  - CAP, MSSA on trach aspirate. Extubated 12/1 - MSSA on blood cx likely contaminate - Urine cx negative - continue Ancef 7 day course to tx MSSA PNA -cont scheduled duonebs  08-22-2023 resolved by the time she was transferred out of ICU on 08-20-2023  Dysphagia as late effect of cerebrovascular accident (CVA) 08-21-2023 Pt with a history of dyspahgia after CVA in 2021, last clinical evaluation recommended dys 2/nectar.  Seen by ST.  Currently has cortrak.  08-22-2023 Discussed with ST and RD. Discussed with husband. Pt is safe to eat with dyphagia diet and nectar thick liquids. Will pull cortrak and start calorie counts.   Will see this weekend if pt can maintain hydration and enough po intake.   Discussed with husband that every time pt gets admitted to hospital, she gets weaker and weaker. She nearly died this admission due to septic shock. Discussed with husband that keeping pt at same level of function should be expectation rather than pt will improve.   Discussed PEG tube and whether this would be consider by some people as artificial nutrition/life support.   Husband states they have been married for 37 years.   Discussed with him that if pt unable to take enough liquids and solid to stay hydrate and nourished, he will need to consider PEG tube.  This would not prevent aspiration pneumonia and may prolong her suffering. I asked pt to think about pt's life and their wishes/dreams as they got older.  Will enlist palliative care to help husband with goals of care. 08-23-2023 cortrak removed. Calorie counts started yesterday. Will see how much she can eat and drink this weekend. Awaiting palliative care consult. 08-24-2023 calorie counts to finish today. Pt eating enough in my estimation but will see what the expert dietician says.  08-25-2023 awaiting calorie counts by RD. Restart dysphagia-1 diet with nectar thick liquids.  08-26-2023 ST working  with pt again today. Remains on dysphagia-1 diet(pureed) with nectar thick liquids. Family has decided AGAINST PEG tube. I agree with this decision.  Weakness of left side of body 08-21-2023 chronic.  Pneumonia of both lower lobes due to methicillin susceptible Staphylococcus aureus (MSSA) (HCC) Since admission to 08-20-2023 improved, off pressors.  - CAP, MSSA on trach aspirate. Extubated 12/1 - MSSA on blood cx likely contaminate - Urine cx negative - continue Ancef 7 day course to tx MSSA PNA -cont scheduled duonebs 08-21-2023 continue with IV Ancef. Today is Day #3 of 7 08-22-2023 discussed with pharmacy. Pt had been on IV rocephin from 11-29 through 12-2.  Will count those days of IV rocephin as therapy towards her treatment of MSSA pneumonia. Will continue IV Ancef for a total of 5 days. Today is Day #4 of 5. 08-23-2023 will complete IV ancef today Today Day #5 of 5. Resolved.  History of cardioembolic cerebrovascular accident (CVA) 08-21-2023 chronic.  DNR (do not resuscitate)/DNI(Do Not Intubate) The family outlined their wishes for the following treatment decisions:   Cardiopulmonary Resuscitation: Do Not Attempt Resuscitation (DNR/No CPR)  Medical Interventions: Limited  Additional Interventions: Use medical treatment, IV fluids and cardiac monitoring as indicated, DO NOT USE intubation or mechanical ventilation. May consider use of less invasive airway support such as BiPAP or CPAP. Also provide comfort measures. Transfer to the hospital if indicated. Avoid intensive care.   Antibiotics: Determine use of limitation of antibiotics when infection occurs  IV Fluids: IV fluids for a defined trial period  Feeding Tube: No feeding tube          Overweight (BMI 25.0-29.9) 08-22-2023 BMI 29.81  Pressure injury of skin  - bilateral heels, bilateral buttocks 08-21-2023 present on admission.  Altered mental status 08-21-2023 unclear what pt's baseline is. Pt lives in SNF prior to  admission. 08-23-2023 pt is more awake today. Placed into recliner by OT 08-24-2023 pt is stable. This appear at her baseline. 08-25-2023 appeared to have a "spell" last night. No seizures noted. CT head negative for new CVA. EEG shows moderate diffuse encephalopathy.  Pt has been made DNR by pt's husband per palliative care service.  Malnutrition of moderate degree 08-21-2023 see RD note 08-21-2023. Moderate Malnutrition related to chronic illness as evidenced by mild fat depletion, severe muscle depletion, moderate muscle depletion.   08-22-2023 will stop cortrak. Start calorie counts. Will monitor over weekend to see how much liquids and solids she takes in.  08-23-2023 continue with calorie counts this weekend. Awaiting palliative care consult for GOC discussion in case pt does not take enough liquids/food to sustain herself. 08-24-2023 awaiting calorie counts by RD.  GOC discussion with palliative care today.  08-25-2023 discussed with palliative care. Per my discussion with palliative care, pt's husband would never want pt to have PEG.  Iron deficiency anemia 08-21-2023 Hg 8.1 g/dl today.   Iron studies show iron deficiency. On tube feeds that have iron. Iron/TIBC/Ferritin/ %Sat    Component Value Date/Time   IRON 20 (L) 08/16/2023 0656   TIBC 204 (L) 08/16/2023 0656   FERRITIN 388 (H) 08/16/2023 0656   IRONPCTSAT 10 (L) 08/16/2023 0656   08-23-2023 will start po nu-iron 150 mg daily  Epilepsy (HCC) 08-21-2023 continue with depakene 08-24-2023 continue with depakene. No breakthrough seizures   DVT prophylaxis: heparin injection 5,000 Units Start: 08/16/23 1400 SCDs Start: 08/16/23 0435     Code Status: Limited: Do not attempt resuscitation (DNR) -DNR-LIMITED -Do Not Intubate/DNI  Family Communication: no family at bedside this AM. Disposition Plan: return to SNF Reason for continuing need for hospitalization: medically stable for DC to SNF  Objective: Vitals:   08/26/23 0551  08/26/23 0552 08/26/23 0740 08/26/23 1205  BP: (!) 149/77  (!) 163/76 139/84  Pulse: 96  98 95  Resp: 18  16 17   Temp: 98.1 F (36.7 C)  98.5 F (36.9 C)   TempSrc: Oral  Oral   SpO2: 100%  100% 100%  Weight:  78.9 kg    Height:        Intake/Output Summary (Last 24 hours) at 08/26/2023 1409 Last data filed at 08/26/2023 0553 Gross per 24 hour  Intake --  Output 450 ml  Net -450 ml   Filed Weights   08/22/23 0707 08/23/23 0522 08/26/23 0552  Weight: 73.9 kg 77.7 kg 78.9 kg    Examination:  Physical Exam Vitals and nursing note reviewed.  Constitutional:      Comments: Awake but non-verbal  HENT:     Head: Normocephalic.  Cardiovascular:     Rate and Rhythm: Normal rate and regular rhythm.  Pulmonary:  Effort: Pulmonary effort is normal.  Abdominal:     General: Bowel sounds are normal.     Palpations: Abdomen is soft.  Skin:    General: Skin is warm and dry.  Neurological:     Comments: Pt is non verbal Pt is awake. Does not follow commands Pt is sucking on her fingers of right hand     Data Reviewed: I have personally reviewed following labs and imaging studies  CBC: Recent Labs  Lab 08/20/23 0821 08/21/23 0729 08/24/23 2106 08/25/23 0713  WBC 6.8 7.0 7.6 7.3  NEUTROABS  --   --   --  3.1  HGB 9.8* 8.1* 8.0* 8.5*  HCT 30.2* 24.9* 24.9* 26.5*  MCV 72.1* 71.6* 73.0* 75.3*  PLT 212 214 302 265   Basic Metabolic Panel: Recent Labs  Lab 08/20/23 0554 08/21/23 0729 08/24/23 2106 08/25/23 0713  NA 139 138 142 145  K 3.4* 3.8 3.8 3.8  CL 105 106 107 109  CO2 25 27 27 25   GLUCOSE 160* 215* 199* 153*  BUN 8 9 21* 17  CREATININE 0.49 0.60 0.69 0.66  CALCIUM 8.3* 7.9* 8.7* 8.8*  MG 1.6*  --   --  1.6*  PHOS 2.1*  --   --   --    GFR: Estimated Creatinine Clearance: 74.5 mL/min (by C-G formula based on SCr of 0.66 mg/dL). Liver Function Tests: Recent Labs  Lab 08/21/23 0729 08/24/23 2106 08/25/23 0713  AST 42* 82* 51*  ALT 47* 65* 52*   ALKPHOS 47 55 44  BILITOT 0.2 0.4 0.5  PROT 4.8* 5.1* 5.3*  ALBUMIN 2.0* 2.1* 2.2*    Recent Labs  Lab 08/24/23 2106  AMMONIA 26   CBG: Recent Labs  Lab 08/25/23 1302 08/25/23 1641 08/25/23 2118 08/26/23 0739 08/26/23 1149  GLUCAP 133* 125* 164* 172* 218*    Radiology Studies: CT HEAD WO CONTRAST ( )  Result Date: 08/25/2023 CLINICAL DATA:  Altered mental status EXAM: CT HEAD WITHOUT CONTRAST TECHNIQUE: Contiguous axial images were obtained from the base of the skull through the vertex without intravenous contrast. RADIATION DOSE REDUCTION: This exam was performed according to the departmental dose-optimization program which includes automated exposure control, adjustment of the mA and/or kV according to patient size and/or use of iterative reconstruction technique. COMPARISON:  08/16/2023 FINDINGS: Brain: No evidence of acute infarction, hemorrhage, hydrocephalus, extra-axial collection or mass lesion/mass effect. Encephalomalacic changes related to prior right MCA distribution infarct. Subcortical white matter and periventricular small vessel ischemic changes. Global cortical and central atrophy. Secondary ventricular prominence. Vascular: Intracranial atherosclerosis. Skull: Normal. Negative for fracture or focal lesion. Sinuses/Orbits: The visualized paranasal sinuses are essentially clear. The mastoid air cells are unopacified. Other: None. IMPRESSION: No acute intracranial abnormality. Prior right MCA distribution infarct. Atrophy with small vessel ischemic changes. Electronically Signed   By: Charline Bills M.D.   On: 08/25/2023 00:37   EEG adult  Result Date: 08/24/2023 Charlsie Quest, MD     08/24/2023 11:15 PM Patient Name: Sawda Badalamenti MRN: [id number] Epilepsy Attending: Charlsie Quest Referring Physician/Provider: [name], MD Date: 08/24/2023 Duration: 43.18 mins Patient history: 57yo F with h/o epilepsy now with ams getting eeg to evaluate for seizure Level of  alertness: Awake, asleep AEDs during EEG study: VPA Technical aspects: This EEG study was done with scalp electrodes positioned according to the 10-20 International system of electrode placement. Electrical activity was reviewed with band pass filter of 1-70Hz , sensitivity of 7 uV/mm, display speed of 38mm/sec  with a 60Hz  notched filter applied as appropriate. EEG data were recorded continuously and digitally stored.  Video monitoring was available and reviewed as appropriate. Description: The posterior dominant rhythm consists of 7 Hz activity of moderate voltage (25-35 uV) seen predominantly in posterior head regions, symmetric and reactive to eye opening and eye closing. Sleep was characterized by vertex waves, sleep spindles (12 to 14 Hz), maximal frontocentral region. EEG showed continuous generalized predominantly 5 to 6 Hz theta slowing admixed with intermittent 2-3Hz  delta slowing. Hyperventilation and photic stimulation were not performed.   ABNORMALITY - Continuous slow, generalized IMPRESSION: This study is suggestive of moderate diffuse encephalopathy. No seizures or epileptiform discharges were seen throughout the recording. Priyanka [name]   DG CHEST PORT 1 VIEW  Result Date: 08/24/2023 CLINICAL DATA:  Short of breath EXAM: PORTABLE CHEST 1 VIEW COMPARISON:  08/17/2023 FINDINGS: Single frontal view of the chest demonstrates stable enlargement of the cardiac silhouette. No airspace disease, effusion, or pneumothorax. No acute bony abnormalities. IMPRESSION: 1. No acute intrathoracic process. Electronically Signed   By: Sharlet Salina M.D.   On: 08/24/2023 21:11    Scheduled Meds:  aspirin  81 mg Oral Daily   atorvastatin  40 mg Oral Daily   carvedilol  1.56 mg Oral BID   clopidogrel  75 mg Oral Daily   vitamin B-12  1,000 mcg Oral Daily   feeding supplement  237 mL Oral BID BM   Gerhardt's butt cream   Topical BID   heparin  5,000 Units Subcutaneous Q8H   insulin aspart  0-20 Units  Subcutaneous TID AC & HS   iron polysaccharides  150 mg Oral Daily   ivabradine  5 mg Oral BID WC   leptospermum manuka honey  1 Application Topical Daily   pantoprazole  40 mg Oral QHS   polyethylene glycol  17 g Oral Daily   senna-docusate  1 tablet Oral BID   sertraline  50 mg Oral Daily   Continuous Infusions:  valproate sodium 250 mg (08/26/23 0826)   valproate sodium 500 mg (08/25/23 2208)     LOS: 10 days   Time spent: 40 minutes  Carollee Herter, DO  [facility]  08/26/2023, 2:09 PM

## 2023-08-26 NOTE — TOC Transition Note (Signed)
Transition of Care [facility]) - CM/SW Discharge Note   Patient Details  Name: [name] MRN: [id number] Date of Birth: 1965/09/22  [facility] CM/SW Contact:  Trystan Eads A [place], Theresia Majors Phone Number: 08/26/2023, 2:33 PM   Clinical Narrative:     Patient will DC to: [facility]  Anticipated DC date: 08/26/23  Family notified: Lahoma Rocker  Transport by: Sharin Mons      Per MD patient ready for DC to [facility] . RN, patient, patient's family, and facility notified of DC. Discharge Summary and FL2 sent to facility. RN to call report prior to discharge ([phone]). DC packet on chart. Ambulance transport requested for patient.     CSW will sign off for now as social work intervention is no longer needed. Please consult [place] again if new needs arise.    Final next level of care: Skilled Nursing Facility Barriers to Discharge: Barriers Resolved   Patient Goals and CMS Choice CMS Medicare.gov Compare Post Acute Care list provided to:: Patient Represenative (must comment) Choice offered to / list presented to : [facility] POA / Guardian  Discharge Placement                Patient [facility] bed at: [facility] Patient to be transferred to facility by: [facility] Name of family member notified: Taylie Dalesio Patient and family notified of of transfer: 08/26/23  Discharge Plan and Services Additional resources added to the After Visit Summary for   In-house Referral: Clinical Social Work   Post Acute Care Choice: Skilled Nursing Facility                               Social Determinants of Health (SDOH) Interventions SDOH Screenings   Food Insecurity: Patient Unable To Answer (08/19/2023)  Housing: Patient Unable To Answer (08/19/2023)  Transportation Needs: Patient Unable To Answer (08/19/2023)  Utilities: Patient Unable To Answer (08/19/2023)  Tobacco Use: Low Risk  (08/16/2023)     Readmission Risk Interventions     No data to display

## 2023-08-26 NOTE — Progress Notes (Signed)
Calorie Count Note  72 hour calorie count ordered.  Diet: DYS 1, NTL Supplements: Ensure Plus High Protein po BID, each supplement provides 350 kcal and 20 grams of protein.  Day 2  Breakfast:  Cream wheat 50%, pancake 50%, sausage 50%, OJ 25%, milk 25% Lunch: Chicken/gravy, Mashed potato, carrots, Swt Tea Dinner: Beef/gravy , mashed potato, broc. Sweet tea Supplements: Ensure Plus High Protein po BID, each supplement provides 350 kcal and 20 grams of protein. Magic cup TID with meals, each supplement provides 290 kcal and 9 grams of protein   Total intake: 1290 kcal (81% of minimum estimated needs)  55 protein (61% of minimum estimated needs)  Day 3 Breakfast: missing Lunch: beef/gravy, rice, green beans  Dinner: missing Supplements: Magic cup TID with meals, each supplement provides 290 kcal and 9 grams of protein   Total intake: 883 kcal (55% of minimum estimated needs)  43 protein (48% of minimum estimated needs)   Pt appetite improving suspect that Current diet with po intake providing adequate nutrition.     Nutrition Dx:  Moderate Malnutrition related to chronic illness as evidenced by mild fat depletion, severe muscle depletion, moderate muscle depletion. - remains applicable  Goal: Patient will meet greater than or equal to 90% of their needs   Intervention: Continue with Magic cup TID with meals, each supplement provides 290 kcal and 9 grams of protein earache DYS1, NTL Advance as medically applicable and tolerated D/C calorie count.  Jamelle Haring RDN, LDN Clinical Dietitian  Pleas see Amion for contact information

## 2023-08-26 NOTE — Progress Notes (Signed)
Patient ID: [name], female   DOB: 1966-01-23, 57 y.o.   MRN: [id number]    Progress Note from the Palliative Medicine Team at [facility]   Patient Name: [name]        Date: 08/26/2023 DOB: [birth year]  Age: 57 y.o. MRN#: [id number] Attending Physician: Carollee Herter, DO Primary Care Physician: [facility] Admit Date: 08/16/2023   Reason for Consultation/Follow-up   Establishing Goals of Care   HPI/ Brief Hospital Review  57 year old woman who presented to [facility] 11/29 as a transfer from [facility] for AMS, suspected urosepsis. PMHx significant for HTN, HLD, CVA (moderate-large R MCA infarct), T2DM, epilepsy, depression.    Patient is a resident of the [facility] [place]) and had decreased responsiveness throughout the course of the day. EMS noted GCS 6, fever and tachycardia. Labs were notable for WBC 13.4, Hgb 9.8, Plt 286. INR 1.1. Na 156, K 3.9, CO2 27, Cr 0.99 (baseline 0.8-1), mildly elevated transaminases 78/74. LA 1.5 > 4.0. ABG 7.42/46/100/29.2. COVID/Flu/RSV negative. UA +protein, nitrites, trace leuks. CXR without active disease. CT Head NAICA, moderate ventriculomegaly (mildly progressive since prior), normal pressure hydrocephalus, stable remote large R MCA territory infarct. Patient was intubated at [facility] for airway protection.   PCCM consulted for ICU admission and transfer.   Significant Events: Admitted 08/16/2023 for septic shock RLL PNA, UTI, acute respiratory failure with hypoxia Intubated in ER on 08-16-2023   Significant Labs: Admission WBC 13.4, HgB 9.8, Na 156, BUN 45, Scr 0.99   Significant Imaging Studies: Admission CXR Support apparatus in appropriate position. 2. No active disease.3. Possible mild thoracic aortic aneurysm. This could be confirmed with dedicated CT imaging. Admission CT chest/abd/pelvis consolidative opacity right lower lobe greater than middle and upper lobe. Pneumonias in the differential. Recommend follow-up. No  bowel obstruction, free air. Minimal areas of fluid and stranding. ET tube and enteric tube. 08-19-2023 EEG suggestive of moderate diffuse encephalopathy. No seizures or epileptiform discharges were seen throughout the recording.   High risk for decompensation.   Family face treatment option decisions, advanced directive decisions and anticipatory care needs.   Subjective  Extensive chart review has been completed prior to meeting with patient/family  including labs, vital signs, imaging, progress/consult notes, orders, medications and available advance directive documents.    This NP assessed patient at the bedside as a follow up for palliative medicine needs and emotional support.  No family at bedside   Patient is confused, non-verbal and unable to follow commands  Spoke to husband by telephone.   Education offered on current medical situation.  Eduction offered on adult failure to thrive 2/2 to sequelae  of CVA .  She is high risk for continued decompensation, infection, skin breakdown.  Education offered on hospice benefit;   Philosophy and eligibility   Husband acknowledges the situation, however he remains hopeful for improvement and continued life.             Education offered today regarding  the importance of continued conversation with family and their  medical providers regarding overall plan of care and treatment options,  ensuring decisions are within the context of the patients values and GOCs.  Husband to come to the bedside tomorrow for ongoing GOC discussion   Questions and concerns addressed   Discussed with primary team and nursing staff   Time:  50 minutes  Detailed review of medical records ( labs, imaging, vital signs), medically appropriate exam ( MS, skin, cardiac,  resp)  discussed with treatment team, counseling and education to patient, family, staff, documenting clinical information, medication management, coordination of care    Lorinda Creed NP   Palliative Medicine Team Team Phone # [phone] Pager [phone]

## 2023-10-19 DEATH — deceased
